# Patient Record
Sex: Male | Born: 1935
Health system: Southern US, Community
[De-identification: ages and names within clinical notes are randomized; demographics above are authoritative.]

## PROBLEM LIST (undated history)

## (undated) DIAGNOSIS — M1990 Unspecified osteoarthritis, unspecified site: Secondary | ICD-10-CM

## (undated) DIAGNOSIS — I10 Essential (primary) hypertension: Secondary | ICD-10-CM

## (undated) DIAGNOSIS — K219 Gastro-esophageal reflux disease without esophagitis: Secondary | ICD-10-CM

## (undated) DIAGNOSIS — A4151 Sepsis due to Escherichia coli [E. coli]: Secondary | ICD-10-CM

## (undated) DIAGNOSIS — Z8719 Personal history of other diseases of the digestive system: Secondary | ICD-10-CM

## (undated) DIAGNOSIS — I2510 Atherosclerotic heart disease of native coronary artery without angina pectoris: Secondary | ICD-10-CM

## (undated) DIAGNOSIS — Z951 Presence of aortocoronary bypass graft: Secondary | ICD-10-CM

## (undated) DIAGNOSIS — R7881 Bacteremia: Secondary | ICD-10-CM

## (undated) DIAGNOSIS — B353 Tinea pedis: Secondary | ICD-10-CM

## (undated) DIAGNOSIS — F32A Depression, unspecified: Secondary | ICD-10-CM

## (undated) DIAGNOSIS — R7303 Prediabetes: Secondary | ICD-10-CM

## (undated) DIAGNOSIS — F329 Major depressive disorder, single episode, unspecified: Secondary | ICD-10-CM

## (undated) DIAGNOSIS — E1142 Type 2 diabetes mellitus with diabetic polyneuropathy: Secondary | ICD-10-CM

## (undated) DIAGNOSIS — Z9289 Personal history of other medical treatment: Secondary | ICD-10-CM

## (undated) DIAGNOSIS — E785 Hyperlipidemia, unspecified: Secondary | ICD-10-CM

## (undated) DIAGNOSIS — Z96659 Presence of unspecified artificial knee joint: Secondary | ICD-10-CM

## (undated) DIAGNOSIS — Z9861 Coronary angioplasty status: Secondary | ICD-10-CM

## (undated) DIAGNOSIS — E1140 Type 2 diabetes mellitus with diabetic neuropathy, unspecified: Secondary | ICD-10-CM

## (undated) HISTORY — DX: Hyperlipidemia, unspecified: E78.5

## (undated) HISTORY — DX: Coronary angioplasty status: Z98.61

## (undated) HISTORY — DX: Gastro-esophageal reflux disease without esophagitis: K21.9

## (undated) HISTORY — DX: Type 2 diabetes mellitus with diabetic polyneuropathy: E11.42

## (undated) HISTORY — DX: Essential (primary) hypertension: I10

## (undated) HISTORY — DX: Presence of aortocoronary bypass graft: Z95.1

## (undated) HISTORY — DX: Bacteremia: R78.81

## (undated) HISTORY — PX: KNEE SURGERY: SHX244

## (undated) HISTORY — DX: Sepsis due to Escherichia coli (e. coli): A41.51

## (undated) HISTORY — PX: COLONOSCOPY: SHX174

## (undated) HISTORY — PX: PTCA: SHX146

## (undated) HISTORY — PX: SIGMOIDOSCOPY: SUR1295

## (undated) HISTORY — DX: Tinea pedis: B35.3

## (undated) HISTORY — DX: Unspecified osteoarthritis, unspecified site: M19.90

## (undated) HISTORY — PX: BRAIN SURGERY: SHX531

## (undated) HISTORY — DX: Type 2 diabetes mellitus with diabetic neuropathy, unspecified: E11.40

## (undated) HISTORY — PX: OTHER SURGICAL HISTORY: SHX169

## (undated) HISTORY — DX: Personal history of other medical treatment: Z92.89

## (undated) HISTORY — PX: CARDIAC CATHETERIZATION: SHX172

## (undated) HISTORY — DX: Presence of unspecified artificial knee joint: Z96.659

## (undated) HISTORY — DX: Atherosclerotic heart disease of native coronary artery without angina pectoris: I25.10

## (undated) HISTORY — PX: UPPER GASTROINTESTINAL ENDOSCOPY: SHX188

---

## 2000-04-19 ENCOUNTER — Ambulatory Visit (HOSPITAL_COMMUNITY): Admission: RE | Admit: 2000-04-19 | Discharge: 2000-04-19 | Payer: Self-pay | Admitting: *Deleted

## 2003-07-16 ENCOUNTER — Inpatient Hospital Stay (HOSPITAL_COMMUNITY): Admission: EM | Admit: 2003-07-16 | Discharge: 2003-07-18 | Payer: Self-pay | Admitting: Emergency Medicine

## 2003-08-03 ENCOUNTER — Emergency Department (HOSPITAL_COMMUNITY): Admission: EM | Admit: 2003-08-03 | Discharge: 2003-08-03 | Payer: Self-pay | Admitting: Family Medicine

## 2004-07-08 ENCOUNTER — Ambulatory Visit: Payer: Self-pay | Admitting: *Deleted

## 2004-07-09 ENCOUNTER — Ambulatory Visit (HOSPITAL_COMMUNITY): Admission: RE | Admit: 2004-07-09 | Discharge: 2004-07-09 | Payer: Self-pay | Admitting: *Deleted

## 2004-07-19 ENCOUNTER — Ambulatory Visit: Payer: Self-pay

## 2004-08-16 ENCOUNTER — Ambulatory Visit: Payer: Self-pay | Admitting: *Deleted

## 2004-08-18 ENCOUNTER — Ambulatory Visit: Payer: Self-pay

## 2005-04-27 ENCOUNTER — Ambulatory Visit: Payer: Self-pay | Admitting: Cardiology

## 2005-05-11 ENCOUNTER — Ambulatory Visit: Payer: Self-pay | Admitting: Internal Medicine

## 2005-05-19 ENCOUNTER — Ambulatory Visit: Payer: Self-pay | Admitting: Internal Medicine

## 2005-10-27 ENCOUNTER — Ambulatory Visit: Payer: Self-pay | Admitting: Cardiology

## 2005-11-08 ENCOUNTER — Ambulatory Visit: Payer: Self-pay | Admitting: Internal Medicine

## 2005-11-10 ENCOUNTER — Ambulatory Visit: Payer: Self-pay | Admitting: Cardiology

## 2005-11-11 ENCOUNTER — Ambulatory Visit: Payer: Self-pay | Admitting: Cardiology

## 2005-12-09 ENCOUNTER — Ambulatory Visit: Payer: Self-pay | Admitting: Internal Medicine

## 2006-02-01 ENCOUNTER — Ambulatory Visit: Payer: Self-pay | Admitting: Cardiology

## 2006-07-19 ENCOUNTER — Ambulatory Visit: Payer: Self-pay | Admitting: Cardiology

## 2006-07-25 ENCOUNTER — Ambulatory Visit: Payer: Self-pay | Admitting: Cardiology

## 2006-11-28 ENCOUNTER — Ambulatory Visit: Payer: Self-pay | Admitting: Internal Medicine

## 2006-11-28 LAB — CONVERTED CEMR LAB
ALT: 24 units/L (ref 0–40)
AST: 23 units/L (ref 0–37)
BUN: 16 mg/dL (ref 6–23)
Cholesterol: 185 mg/dL (ref 0–200)
Creatinine, Ser: 1 mg/dL (ref 0.4–1.5)
HDL: 32.2 mg/dL — ABNORMAL LOW (ref >39.0)
LDL Cholesterol: 126 mg/dL — ABNORMAL HIGH (ref 0–99)
PSA: 0.99 ng/mL (ref 0.10–4.00)
Potassium: 4.1 meq/L (ref 3.5–5.1)
Total CHOL/HDL Ratio: 5.7
Triglycerides: 133 mg/dL (ref 0–149)
VLDL: 27 mg/dL (ref 0–40)

## 2007-01-09 ENCOUNTER — Ambulatory Visit: Payer: Self-pay | Admitting: Cardiology

## 2007-01-19 ENCOUNTER — Ambulatory Visit: Payer: Self-pay | Admitting: Internal Medicine

## 2007-08-27 ENCOUNTER — Ambulatory Visit: Payer: Self-pay | Admitting: Cardiology

## 2007-09-03 ENCOUNTER — Telehealth: Payer: Self-pay | Admitting: Internal Medicine

## 2007-11-26 ENCOUNTER — Encounter: Payer: Self-pay | Admitting: *Deleted

## 2007-11-26 DIAGNOSIS — Z9861 Coronary angioplasty status: Secondary | ICD-10-CM

## 2007-11-26 HISTORY — DX: Coronary angioplasty status: Z98.61

## 2008-02-12 ENCOUNTER — Telehealth: Payer: Self-pay | Admitting: Internal Medicine

## 2008-05-28 ENCOUNTER — Telehealth: Payer: Self-pay | Admitting: Internal Medicine

## 2008-06-09 ENCOUNTER — Telehealth: Payer: Self-pay | Admitting: Internal Medicine

## 2008-10-14 ENCOUNTER — Encounter: Payer: Self-pay | Admitting: Cardiovascular Disease

## 2008-10-14 ENCOUNTER — Ambulatory Visit: Payer: Self-pay | Admitting: Cardiovascular Disease

## 2008-10-29 ENCOUNTER — Telehealth: Payer: Self-pay | Admitting: Cardiovascular Disease

## 2009-04-14 ENCOUNTER — Ambulatory Visit: Payer: Self-pay | Admitting: Cardiovascular Disease

## 2009-06-29 ENCOUNTER — Ambulatory Visit: Payer: Self-pay | Admitting: Internal Medicine

## 2009-06-29 DIAGNOSIS — M545 Low back pain, unspecified: Secondary | ICD-10-CM | POA: Insufficient documentation

## 2009-07-09 ENCOUNTER — Telehealth: Payer: Self-pay | Admitting: Internal Medicine

## 2009-12-28 ENCOUNTER — Telehealth: Payer: Self-pay | Admitting: Cardiovascular Disease

## 2010-01-29 ENCOUNTER — Ambulatory Visit: Payer: Self-pay | Admitting: Cardiovascular Disease

## 2010-02-22 ENCOUNTER — Ambulatory Visit: Payer: Self-pay

## 2010-02-22 ENCOUNTER — Encounter: Payer: Self-pay | Admitting: Cardiovascular Disease

## 2010-05-10 ENCOUNTER — Telehealth (INDEPENDENT_AMBULATORY_CARE_PROVIDER_SITE_OTHER): Payer: Self-pay | Admitting: *Deleted

## 2010-05-18 ENCOUNTER — Ambulatory Visit: Payer: Self-pay | Admitting: Internal Medicine

## 2010-05-19 ENCOUNTER — Telehealth: Payer: Self-pay | Admitting: Internal Medicine

## 2010-05-21 ENCOUNTER — Telehealth: Payer: Self-pay | Admitting: Internal Medicine

## 2010-05-25 ENCOUNTER — Telehealth: Payer: Self-pay | Admitting: Internal Medicine

## 2010-05-27 ENCOUNTER — Ambulatory Visit: Payer: Self-pay | Admitting: Internal Medicine

## 2010-05-27 LAB — CONVERTED CEMR LAB
ALT: 23 units/L (ref 0–53)
AST: 26 units/L (ref 0–37)
Albumin: 4.1 g/dL (ref 3.5–5.2)
Alkaline Phosphatase: 48 units/L (ref 39–117)
BUN: 16 mg/dL (ref 6–23)
Bilirubin, Direct: 0.1 mg/dL (ref 0.0–0.3)
CO2: 27 meq/L (ref 19–32)
Calcium: 9.1 mg/dL (ref 8.4–10.5)
Chloride: 102 meq/L (ref 96–112)
Cholesterol: 196 mg/dL (ref 0–200)
Creatinine, Ser: 1 mg/dL (ref 0.4–1.5)
GFR calc non Af Amer: 82.5 mL/min (ref >60)
Glucose, Bld: 124 mg/dL — ABNORMAL HIGH (ref 70–99)
HDL: 35.4 mg/dL — ABNORMAL LOW (ref >39.00)
LDL Cholesterol: 123 mg/dL — ABNORMAL HIGH (ref 0–99)
Potassium: 3.9 meq/L (ref 3.5–5.1)
Sodium: 139 meq/L (ref 135–145)
Total Bilirubin: 0.6 mg/dL (ref 0.3–1.2)
Total CHOL/HDL Ratio: 6
Total Protein: 6.6 g/dL (ref 6.0–8.3)
Triglycerides: 187 mg/dL — ABNORMAL HIGH (ref 0.0–149.0)
VLDL: 37.4 mg/dL (ref 0.0–40.0)

## 2010-06-04 ENCOUNTER — Encounter: Payer: Self-pay | Admitting: Internal Medicine

## 2010-07-01 ENCOUNTER — Ambulatory Visit: Payer: Self-pay | Admitting: Internal Medicine

## 2010-07-01 DIAGNOSIS — M542 Cervicalgia: Secondary | ICD-10-CM | POA: Insufficient documentation

## 2010-07-06 ENCOUNTER — Encounter: Payer: Self-pay | Admitting: Internal Medicine

## 2010-07-29 ENCOUNTER — Ambulatory Visit
Admission: RE | Admit: 2010-07-29 | Discharge: 2010-07-29 | Payer: Self-pay | Source: Home / Self Care | Attending: Cardiovascular Disease | Admitting: Cardiovascular Disease

## 2010-08-10 NOTE — Progress Notes (Signed)
  Phone Note Refill Request Message from:  Fax from Pharmacy on May 21, 2010 11:52 AM  Refills Requested: Medication #1:  KLOR-CON M20 20 MEQ CR-TABS 2 tab once two times a day Initial call taken by: Ami Bullins CMA,  May 21, 2010 11:52 AM    Prescriptions: KLOR-CON M20 20 MEQ CR-TABS (POTASSIUM CHLORIDE CRYS CR) 2 tab once two times a day  #180 x 3   Entered by:   Ami Bullins CMA   Authorized by:   Jacques Navy MD   Signed by:   Bill Salinas CMA on 05/21/2010   Method used:   Faxed to ...       [facility] (mail-order)       [address] [place], [place]  [address]       Ph: [phone]       Fax: [phone]   RxID:   [id number]

## 2010-08-10 NOTE — Letter (Signed)
[facility] [address] [place], [place]  [address] Phone: [phone]      June 06, 2010   [name] [address] [place], [place] [address]  RE:  LAB RESULTS  Dear  [name],  The following is an interpretation of your most recent lab tests.  Please take note of any instructions provided or changes to medications that have resulted from your lab work.  ELECTROLYTES:  Good - no changes needed  KIDNEY FUNCTION TESTS:  Good - no changes needed  LIVER FUNCTION TESTS:  Good - no changes needed  Health professionals look at cholesterol as more involved than just the total cholesterol. We consider the level of LDL (bad) cholesterol, HDL (good), cholesterol, and Triglycerides (Grease) in the blood.  1. Your LDL should be under 100, and the HDL should be over 45, if you have any vascular disease such as heart attack, angina, stroke, TIA (mini stroke), claudication (pain in the legs when you walk due to poor circulation),  Abdominal Aortic Aneurysm (AAA), diabetes or prediabetes.  2. Your LDL should be under 130 if you have any two of the following:     a. Smoke or chew tobacco,     b. High blood pressure (if you are on medication or over 140/90 without medication),     c. Male gender,    d. HDL below 40,    e. A male relative (father, brother, or son), who have had any vascular event          as described in #1. above under the age of 94, or a male relative (mother,       sister, or daughter) who had an event as described above under age 45. (An HDL over 60 will subtract one risk factor from the total, so if you have two items in # 2 above, but an HDL over 60, you then fall into category # 3 below).  3. Your LDL should be under 160 if you have any one of the above.  Triglycerides should be under 200 with the ideal being under 150.  For diabetes or pre-diabetes, the ideal HgbA1C should be under 6.0%.  If you fall into any of the above categories,  you should make a follow up appointment to discuss this with your physician.  LIPID PANEL:  Stable - no changes needed Triglyceride: 187.0   Cholesterol: 196   LDL: 123   HDL: 35.40   Chol/HDL%:  6   Lab results look ok. Blood sugar in the fasting state is above normal, e.g. greater than 115. Recommend that you follow a no sugar low carb diet.  Please come see me if you have any questions about these lab results.   Sincerely Yours,    Craig Navy MD  Patient: [name] Note: All result statuses are Final unless otherwise noted.  Tests: (1) BMP (METABOL)   Sodium                    139 mEq/L                   135-145   Potassium                 3.9 mEq/L                   3.5-5.1   Chloride  102 mEq/L                   96-112   Carbon Dioxide            27 mEq/L                    19-32   Glucose              [H]  124 mg/dL                   60-45   BUN                       16 mg/dL                    4-09   Creatinine                1.0 mg/dL                   8.1-1.9   Calcium                   9.1 mg/dL                   1.4-78.2   GFR                       82.50 mL/min                >60  Tests: (2) Lipid Panel (LIPID)   Cholesterol               196 mg/dL                   9-562     ATP III Classification            Desirable:  < 200 mg/dL                    Borderline High:  200 - 239 mg/dL               High:  > = 240 mg/dL   Triglycerides        [H]  187.0 mg/dL                 1.3-086.5     Normal:  <150 mg/dL     Borderline High:  784 - 199 mg/dL   HDL                  [L]  69.62 mg/dL                 >95.28   VLDL Cholesterol          37.4 mg/dL                  4.1-32.4   LDL Cholesterol      [H]  401 mg/dL                   0-27  CHO/HDL Ratio:  CHD Risk                             6                    Men  Women     1/2 Average Risk     3.4          3.3     Average Risk          5.0          4.4     2X Average Risk           9.6          7.1     3X Average Risk          15.0          11.0                           Tests: (3) Hepatic/Liver Function Panel (HEPATIC)   Total Bilirubin           0.6 mg/dL                   2.5-9.5   Direct Bilirubin          0.1 mg/dL                   6.3-8.7   Alkaline Phosphatase      48 U/L                      39-117   AST                       26 U/L                      0-37   ALT                       23 U/L                      0-53   Total Protein             6.6 g/dL                    5.6-4.3   Albumin                   4.1 g/dL                    3.2-9.5

## 2010-08-10 NOTE — Progress Notes (Signed)
  Phone Note Outgoing Call   Reason for Call: Discuss lab or test results Summary of Call: please call  pt: x-ray with mild shifitng of vertebra at L2 and L4 but no marked disk or degenerative joint disease. Stick with instructions given. No need for any further testing.   Thanks  Initial call taken by: Jacques Navy MD,  May 19, 2010 4:43 AM  Follow-up for Phone Call        informed pt of results Follow-up by: Ami Bullins CMA,  May 19, 2010 1:20 PM

## 2010-08-10 NOTE — Assessment & Plan Note (Signed)
Summary: back pain-lb   Vital Signs:  Patient profile:   76 year old male Height:      68 inches Weight:      190 pounds BMI:     28.99 O2 Sat:      97 % on Room air Temp:     97.7 degrees F oral Pulse rate:   77 / minute BP sitting:   128 / 78  (left arm) Cuff size:   large  Vitals Entered By: Bill Salinas CMA (May 18, 2010 10:06 AM)  O2 Flow:  Room air CC: pt here with c/o lower back pain. He states he has been doing back excercises with little relief/ ab   Primary Care Johnnetta Holstine:  Jacques Navy MD  CC:  pt here with c/o lower back pain. He states he has been doing back excercises with little relief/ ab.  History of Present Illness: Patient presents with a long hostory of low back pain. He noticed this more when in [place] after an 1our motorcycle ride.  The pain is in the low back, more on the left than the right. He has had no radiaition o f pain to the leg. He has had no focal weakness, incontinenc of bowel or bladder, no paresthesia. He has been taking no medication for the pain, he is not doing any stretching or flexing exercise.   Current Medications (verified): 1)  Atenolol-Chlorthalidone 50-25 Mg  Tabs (Atenolol-Chlorthalidone) .... Take Once Daily 2)  Aspirin 325 Mg  Tabs (Aspirin) .... Take Once Daily 3)  Klor-Con M20 20 Meq Cr-Tabs (Potassium Chloride Crys Cr) .... 2 Tab Once Two Times A Day 4)  Flax Seed Oil 1000 Mg  Caps (Flaxseed (Linseed)) .... Take One Tablet Two Times A Day 5)  Imdur 60 Mg Xr24h-Tab (Isosorbide Mononitrate) .... Take One Tablet Daily 6)  Red Yeast Rice Extract 600 Mg  Caps (Red Yeast Rice Extract) .... Take 1 Capsule By Mouth Three Times A Day 7)  Nitroquick 0.4 Mg  Subl (Nitroglycerin) .... Take Sl As Needed For Chest Pains.  Allergies (verified): 1)  ! * Statins  Past History:  Past Medical History: Last updated: 01/29/2010 CORONARY ARTERY DISEASE (ICD-414.00) s/p stent mid LAD 2005 DEGENERATIVE JOINT DISEASE (ICD-715.90) GERD  (ICD-530.81) DIABETES MELLITUS (ICD-250.00) HYPERTENSION (ICD-401.9) HYPERLIPIDEMIA (ICD-272.4)    Past Surgical History: Last updated: 10/14/2008 PERCUTANEOUS TRANSLUMINAL CORONARY ANGIOPLASTY, HX OF (ICD-V45.82) Knee surgery Kidney stone removal FH reviewed for relevance, SH/Risk Factors reviewed for relevance  Review of Systems  The patient denies anorexia, fever, weight loss, chest pain, dyspnea on exertion, prolonged cough, abdominal pain, muscle weakness, difficulty walking, and enlarged lymph nodes.    Physical Exam  General:  WNWD white male in no distress Head:  normocephalic and atraumatic.   Eyes:  vision grossly intact.   Lungs:  normal respiratory effort and normal breath sounds.   Heart:  normal rate and regular rhythm.   Abdomen:  soft and non-tender.   Msk:  back: nl stand, flex, gait. Some discomfort with heel walk, no discomfort with toe walk. Able to step up to exam without assist. Nl SLR sitting, nl DTRs patellar tendons, nl sensation to light touch and pin-prick, decrease in deep vibratory sensation laterl right leg and foot.  Pulses:  2+ radial and DP pulses Extremities:  No clubbing, cyanosis, edema, or deformity noted with normal full range of motion of all joints.   Neurologic:  alert & oriented X3 and cranial nerves II-XII intact.  Skin:  turgor normal, color normal, and no suspicious lesions.   Cervical Nodes:  no anterior cervical adenopathy and no posterior cervical adenopathy.   Psych:  Oriented X3, normally interactive, good eye contact, and not anxious appearing.     Impression & Recommendations:  Problem # 1:  LOW BACK PAIN, ACUTE (ICD-724.2) Patient with no radicular findings on exam.  Plan - plain films to evaluated for DDD vs DJD           referred to YouTube for low back exercise and stretching; given directions to [email or web address] for info on back pain           OK to take Aleve two times a day - GI precautions given.  His updated  medication list for this problem includes:    Aspirin 325 Mg Tabs (Aspirin) .Marland Kitchen... Take once daily  Orders: T-Lumbar Spine Comp w/Bend View [id number])  G LUMBAR SPINE COMPLETE W/BEND - [id number]   Clinical Data: Low back and left leg pain.   LUMBAR SPINE - COMPLETE WITH BENDING VIEWS   Comparison: Lumbar spine series 08/03/2003.   Findings: A lower pole right renal calculus is noted.  There is mild anterolisthesis of L4 which is a new finding.  No definite pars defects.  This is likely due to degenerative facet disease. In the neutral lateral position this is estimated at 5 mm.  With flexion this increases to 11 mm and in extension is 7.5 mm.  There is also mild anterolisthesis of L2 measured at 5 mm in neutral, 6 mm with flexion and 9 mm with extension.  No acute bony findings. There are SI joint generative changes.   IMPRESSION:   1.  Anterolisthesis of L2 and L4 with some motion/instability with flexion and extension. 2.  No pars defects. 3.  No acute bony findings. 4.  Right lower pole renal calculus.   Read By:  Cyndie Chime,  M.D.  Complete Medication List: 1)  Atenolol-chlorthalidone 50-25 Mg Tabs (Atenolol-chlorthalidone) .... Take once daily 2)  Aspirin 325 Mg Tabs (Aspirin) .... Take once daily 3)  Klor-con M20 20 Meq Cr-tabs (Potassium chloride crys cr) .... 2 tab once two times a day 4)  Flax Seed Oil 1000 Mg Caps (Flaxseed (linseed)) .... Take one tablet two times a day 5)  Imdur 60 Mg Xr24h-tab (Isosorbide mononitrate) .... Take one tablet daily 6)  Red Yeast Rice Extract 600 Mg Caps (Red yeast rice extract) .... Take 1 capsule by mouth three times a day 7)  Nitroquick 0.4 Mg Subl (Nitroglycerin) .... Take sl as needed for chest pains.  Patient Instructions: 1)  low back pain - on exam I do not see signs of a pinched nerve that would require surgery, thus no MRI. I believe you have arhtritis of the lumbar spine that is causing the pain. Plan - plain x-rays;  OK to take aleve 1 in the AM and 1 in the PM. Stretch and flex is the golden key to treatment.  Prescriptions: NITROQUICK 0.4 MG  SUBL (NITROGLYCERIN) Take sl as needed for chest pains.  #25 x 3   Entered by:   Bill Salinas CMA   Authorized by:   Jacques Navy MD   Signed by:   Bill Salinas CMA on 05/18/2010   Method used:   Faxed to ...       [facility] (mail[facility]order)       [address]       [place], [place]  [address]  Ph: [phone]       Fax: [phone]   RxID:   [id number] IMDUR 60 MG XR24H-TAB (ISOSORBIDE MONONITRATE) take one tablet daily  #90 x 3   Entered by:   Ami Bullins CMA   Authorized by:   Jacques Navy MD   Signed by:   Bill Salinas CMA on 05/18/2010   Method used:   Faxed to ...       [facility] (mail-order)       [address] [place], [place]  [address]       Ph: [phone]       Fax: [phone]   RxID:   [id number] KLOR-CON M20 20 MEQ CR-TABS (POTASSIUM CHLORIDE CRYS CR) 2 tab once two times a day  #180 x 3   Entered by:   Ami Bullins CMA   Authorized by:   Jacques Navy MD   Signed by:   Bill Salinas CMA on 05/18/2010   Method used:   Faxed to ...       [facility] (mail-order)       [address] [place], [place]  [address]       Ph: [phone]       Fax: [phone]   RxID:   [id number] ATENOLOL-CHLORTHALIDONE 50-25 MG  TABS (ATENOLOL-CHLORTHALIDONE) Take once daily  #90 x 3   Entered by:   Bill Salinas CMA   Authorized by:   Jacques Navy MD   Signed by:   Bill Salinas CMA on 05/18/2010   Method used:   Faxed to ...       [facility] (mail-order)       [address] [place], [place]  [address]       Ph: [phone]       Fax: [phone]   RxID:   [id number]    Orders Added: 1)  T-Lumbar Spine Comp w/Bend View [72114TC] 2)  Est. Patient Level IV [25852]     Preventive Care Screening  Last Flu Shot:    Date:  05/17/2010    Results:  Declined

## 2010-08-10 NOTE — Progress Notes (Signed)
Summary: Mail order pharmacy ?  Phone Note From Pharmacy   Caller: [facility] [phone] REF # [phone] Summary of Call: Mail order pharm called. They need clarification of potassium rx. It is 2 tabs two times a day CORRECT?  Initial call taken by: Lamar Sprinkles, CMA,  May 25, 2010 10:35 AM  Follow-up for Phone Call        Hmmm. a little confusing. When in doubt - ask the patient what he is taking.   Reviewed records: last lab in '08!!!! Needs to have metabolic 401.9, lipid panel 272.4 hepatic 995.20. NOW.  Will base up-dated dosing on potassium on the lab work.  Follow-up by: Jacques Navy MD,  May 25, 2010 1:00 PM  Additional Follow-up for Phone Call Additional follow up Details #1::        Spoke w/pt, he is taking 2 tabs two times a day and will come in Thurs AM for fasting labs. Additional Follow-up by: Lamar Sprinkles, CMA,  May 25, 2010 2:10 PM    Additional Follow-up for Phone Call Additional follow up Details #2::    then the [facility] was right and we can refill as it is written. THANKS Follow-up by: Jacques Navy MD,  May 26, 2010 9:02 AM  New/Updated Medications: KLOR-CON M20 20 MEQ CR-TABS (POTASSIUM CHLORIDE CRYS CR) 2 tabs two times a day Prescriptions: KLOR-CON M20 20 MEQ CR-TABS (POTASSIUM CHLORIDE CRYS CR) 2 tabs two times a day  #180 x 3   Entered by:   Lamar Sprinkles, CMA   Authorized by:   Jacques Navy MD   Signed by:   Lamar Sprinkles, CMA on 05/26/2010   Method used:   Faxed to ...       [facility] (mail[facility]order)       [address] [place], [place]  [address]       Ph: [phone]       Fax: [phone]   RxID:   [id number]

## 2010-08-10 NOTE — Assessment & Plan Note (Signed)
Summary: rov   Visit Type:  Follow-up Primary Provider:  Jacques Navy MD  CC:  Some chest pains since last visit.  History of Present Illness: 76 yo WM with history of CAD s/p placement of drug eluting stent in mid LAD in January of 2005 with residual disease in proximal LAD and RCA, HTN, Hyperlipidemia, DM and GERD who presents today for routine cardiac follow up.  He has been doing well and has remained active. He describes stable chest pains over the last few months. These pains have not been as intense as the pain that he had before his stent placement.  The pain does not radiate into his neck, jaw or arm. There is no associated SOB, diaphoresis or nausea. The pains occur after breakfast. He does He has not tolerated statins in the past secondary to myalgias. His main complaints are of back pain and abdominal pain, mostly chronic. He has been on muscle relaxants and steroids with little improvement.   Current Medications (verified): 1)  Atenolol-Chlorthalidone 50-25 Mg  Tabs (Atenolol-Chlorthalidone) .... Take Once Daily 2)  Aspirin 325 Mg  Tabs (Aspirin) .... Take Once Daily 3)  Klor-Con M20 20 Meq Cr-Tabs (Potassium Chloride Crys Cr) .... 2 Tab Once Daily 4)  Flax Seed Oil 1000 Mg  Caps (Flaxseed (Linseed)) .... Take One Tablet Two Times A Day 5)  Imdur 60 Mg Xr24h-Tab (Isosorbide Mononitrate) .... Take One Tablet Daily 6)  Red Yeast Rice Extract 600 Mg  Caps (Red Yeast Rice Extract) .... Take 1 Capsule By Mouth Three Times A Day 7)  Nitroquick 0.4 Mg  Subl (Nitroglycerin) .... Take Sl As Needed For Chest Pains.  Allergies: 1)  ! * Statins  Past History:  Past Medical History: CORONARY ARTERY DISEASE (ICD-414.00) s/p stent mid LAD 2005 DEGENERATIVE JOINT DISEASE (ICD-715.90) GERD (ICD-530.81) DIABETES MELLITUS (ICD-250.00) HYPERTENSION (ICD-401.9) HYPERLIPIDEMIA (ICD-272.4)    Social History: Reviewed history from 10/14/2008 and no changes required. Tobacco history, not  used since 1985. Smoked 1ppd for 35 years No alcohol No illicit drug use Married 3 children Retired from [employer]  Review of Systems       The patient complains of chest pain and joint pain.  The patient denies fatigue, malaise, fever, weight gain/loss, vision loss, decreased hearing, hoarseness, palpitations, shortness of breath, prolonged cough, wheezing, sleep apnea, coughing up blood, abdominal pain, blood in stool, nausea, vomiting, diarrhea, heartburn, incontinence, blood in urine, muscle weakness, leg swelling, rash, skin lesions, headache, fainting, dizziness, depression, anxiety, enlarged lymph nodes, easy bruising or bleeding, and environmental allergies.    Vital Signs:  Patient profile:   76 year old male Height:      68 inches Weight:      189.50 pounds BMI:     28.92 Pulse rate:   69 / minute Pulse rhythm:   regular Resp:     18 per minute BP sitting:   110 / 70  (left arm) Cuff size:   large  Vitals Entered By: Vikki Ports (January 29, 2010 10:24 AM)  Physical Exam  General:  General: Well developed, well nourished, NAD HEENT: OP clear, mucus membranes moist SKIN: warm, dry Neuro: No focal deficits Musculoskeletal: Muscle strength 5/5 all ext Psychiatric: Mood and affect normal Neck: No JVD, mild carotid bruit, no thyromegaly, no lymphadenopathy. Lungs:Clear bilaterally, no wheezes, rhonci, crackles CV: RRR no murmurs, gallops rubs Abdomen: soft, NT, ND, BS present Extremities: No edema, pulses 2+.    EKG  Procedure date:  01/29/2010  Findings:  NSR, rate 69 bpm. LAFB.   Impression & Recommendations:  Problem # 1:  CORONARY ARTERY DISEASE (ICD-414.00) Stable. I have encouraged him to call us if his chest pain changes in nature or in frequency. He has not shown ischemia on stress testing in the past. He would need repeat cath if his chest pain worsens.  Will continue current meds. He does not tolerate statins. Will refill sublingual NTG.  His  updated medication list for this problem includes:    Atenolol-chlorthalidone 50-25 Mg Tabs (Atenolol-chlorthalidone) .Marland Kitchen... Take once daily    Aspirin 325 Mg Tabs (Aspirin) .Marland Kitchen... Take once daily    Imdur 60 Mg Xr24h-tab (Isosorbide mononitrate) .Marland Kitchen... Take one tablet daily    Nitroquick 0.4 Mg Subl (Nitroglycerin) .Marland Kitchen... Take sl as needed for chest pains.  Orders: Carotid Duplex (Carotid Duplex) Abdominal Aorta Duplex (Abd Aorta Duplex)  Problem # 2:  HYPERLIPIDEMIA (ICD-272.4) Per Dr. Debby Bud. He has not tolerated statins. He needs repeat lipids. Will discuss with Dr. Debby Bud at f/u in near future.   Problem # 3:  HYPERTENSION (ICD-401.9) BP well controlled. Continue current therapy.   His updated medication list for this problem includes:    Atenolol-chlorthalidone 50-25 Mg Tabs (Atenolol-chlorthalidone) .Marland Kitchen... Take once daily    Aspirin 325 Mg Tabs (Aspirin) .Marland Kitchen... Take once daily  Patient Instructions: 1)  Your physician recommends that you schedule a follow-up appointment in: 6 months 2)  Your physician recommends that you continue on your current medications as directed. Please refer to the Current Medication list given to you today. 3)  Your physician has requested that you have an abdominal aorta duplex. During this test, an ultrasound is used to evaluate the aorta. Allow 30 minutes for this exam. Do not eat after midnight the day before and avoid carbonated beverages. There are no restrictions or special instructions. 4)  Your physician has requested that you have a carotid duplex. This test is an ultrasound of the carotid arteries in your neck. It looks at blood flow through these arteries that supply the brain with blood. Allow one hour for this exam. There are no restrictions or special instructions. Prescriptions: NITROQUICK 0.4 MG  SUBL (NITROGLYCERIN) Take sl as needed for chest pains.  #25 x 6   Entered by:   Dossie Arbour, RN, BSN   Authorized by:   Verne Carrow,  MD   Signed by:   Dossie Arbour, RN, BSN on 01/29/2010   Method used:   Electronically to        [facility]* (retail)       [address]       [place], [place]  [address]       Ph: [phone]       Fax: [phone]   RxID:   [id number]

## 2010-08-10 NOTE — Progress Notes (Signed)
Summary: refill request  Phone Note Call from Patient   Caller: Patient Summary of Call: pt called to see about getting refill on klor-kon.  Initial call taken by: Alysia Penna,  May 10, 2010 3:05 PM  Follow-up for Phone Call        refill request has been sent to pharmacy. tried to call Pt to let them know but no answer. Follow-up by: Alysia Penna,  May 10, 2010 3:05 PM

## 2010-08-10 NOTE — Progress Notes (Signed)
Summary: refill - atenolol/chlorthalidone  Phone Note Refill Request Message from:  Patient on December 28, 2009 12:58 PM  Refills Requested: Medication #1:  ATENOLOL-CHLORTHALIDONE 50-25 MG  TABS Take once daily [facility]  Initial call taken by: Judie Grieve,  December 28, 2009 12:58 PM    Prescriptions: ATENOLOL-CHLORTHALIDONE 50-25 MG  TABS (ATENOLOL-CHLORTHALIDONE) Take once daily  #90 x 3   Entered by:   Hardin Negus, RMA   Authorized by:   Verne Carrow, MD   Signed by:   Hardin Negus, RMA on 12/28/2009   Method used:   Faxed to ...       [facility] (mail-order)       [address] [place], [place]  [address]       Ph: [phone]       Fax: [phone]   RxID:   [id number]

## 2010-08-12 NOTE — Assessment & Plan Note (Signed)
Summary: 6 month rov.sl   Visit Type:  6 month follow up Primary Provider:  Jacques Navy MD  CC:  no complaints.  History of Present Illness: 76 yo WM with history of CAD s/p placement of drug eluting stent in mid LAD in January of 2005 with residual disease in proximal LAD and RCA, HTN, Hyperlipidemia, DM and GERD who presents today for routine cardiac follow up.  He has been doing well and has remained active. He describes stable chest pains over the last few months. These pains have not been as intense as the pain that he had before his stent placement.  The pain does not radiate into his neck, jaw or arm. He has not used his NTG. There is no associated SOB, diaphoresis or nausea.  Occurs mostly after meals. He has not tolerated statins in the past secondary to myalgias. His main complaints are of back pain and abdominal pain, mostly chronic. He was told that he has arthritic changes. He has been using muscle relaxants as needed.   Current Medications (verified): 1)  Atenolol-Chlorthalidone 50-25 Mg  Tabs (Atenolol-Chlorthalidone) .... Take Once Daily 2)  Aspirin 325 Mg  Tabs (Aspirin) .... Take Once Daily 3)  Klor-Con M20 20 Meq Cr-Tabs (Potassium Chloride Crys Cr) .... 2 Tabs Two Times A Day 4)  Flax Seed Oil 1000 Mg  Caps (Flaxseed (Linseed)) .... Take One Tablet Two Times A Day 5)  Imdur 60 Mg Xr24h-Tab (Isosorbide Mononitrate) .... Take One Tablet Daily 6)  Red Yeast Rice Extract 600 Mg  Caps (Red Yeast Rice Extract) .... Take 1 Capsule By Mouth Three Times A Day 7)  Nitroquick 0.4 Mg  Subl (Nitroglycerin) .... Take Sl As Needed For Chest Pains. 8)  Cyclobenzaprine Hcl 5 Mg Tabs (Cyclobenzaprine Hcl) .Marland Kitchen.. 1 or 2 Tablets Every 6 Hours For Neck Stiffness and Pain.  Allergies (verified): 1)  ! * Statins  Past History:  Past Medical History: Reviewed history from 01/29/2010 and no changes required. CORONARY ARTERY DISEASE (ICD-414.00) s/p stent mid LAD 2005 DEGENERATIVE JOINT  DISEASE (ICD-715.90) GERD (ICD-530.81) DIABETES MELLITUS (ICD-250.00) HYPERTENSION (ICD-401.9) HYPERLIPIDEMIA (ICD-272.4)    Social History: Reviewed history from 10/14/2008 and no changes required. Tobacco history, not used since 1985. Smoked 1ppd for 35 years No alcohol No illicit drug use Married 3 children Retired from [employer]  Review of Systems       The patient complains of chest pain and joint pain.  The patient denies fatigue, malaise, fever, weight gain/loss, vision loss, decreased hearing, hoarseness, palpitations, shortness of breath, prolonged cough, wheezing, sleep apnea, coughing up blood, abdominal pain, blood in stool, nausea, vomiting, diarrhea, heartburn, incontinence, blood in urine, muscle weakness, leg swelling, rash, skin lesions, headache, fainting, dizziness, depression, anxiety, enlarged lymph nodes, easy bruising or bleeding, and environmental allergies.    Vital Signs:  Patient profile:   76 year old male Height:      68 inches Weight:      194.50 pounds BMI:     29.68 Pulse rate:   88 / minute BP sitting:   136 / 80  (left arm) Cuff size:   regular  Vitals Entered By: Caralee Ates CMA (July 29, 2010 2:26 PM)  Physical Exam  General:  General: Well developed, well nourished, NAD Musculoskeletal: Muscle strength 5/5 all ext Psychiatric: Mood and affect normal Neck: No JVD, no carotid bruits, no thyromegaly, no lymphadenopathy. Lungs:Clear bilaterally, no wheezes, rhonci, crackles CV: RRR no murmurs, gallops rubs Abdomen: soft, NT, ND,  BS present Extremities: No edema, pulses 2+.    Impression & Recommendations:  Problem # 1:  CORONARY ARTERY DISEASE (ICD-414.00) Stable. No changes in medical therapy.   His updated medication list for this problem includes:    Atenolol-chlorthalidone 50-25 Mg Tabs (Atenolol-chlorthalidone) .Marland Kitchen... Take once daily    Aspirin 325 Mg Tabs (Aspirin) .Marland Kitchen... Take once daily    Imdur 60 Mg Xr24h-tab (Isosorbide  mononitrate) .Marland Kitchen... Take one tablet daily    Nitroquick 0.4 Mg Subl (Nitroglycerin) .Marland Kitchen... Take sl as needed for chest pains.  Problem # 2:  HYPERTENSION (ICD-401.9) Well controlled.  No changes.   His updated medication list for this problem includes:    Atenolol-chlorthalidone 50-25 Mg Tabs (Atenolol-chlorthalidone) .Marland Kitchen... Take once daily    Aspirin 325 Mg Tabs (Aspirin) .Marland Kitchen... Take once daily  Patient Instructions: 1)  Your physician recommends that you schedule a follow-up appointment in: 6 months.  2)  Your physician recommends that you continue on your current medications as directed. Please refer to the Current Medication list given to you today.

## 2010-08-12 NOTE — Letter (Signed)
   [facility] [address] [place], [place]  [address] Phone: [phone]      July 06, 2010   EFE FAZZINO [address] [place], [place] [address]  RE:  LAB RESULTS  Dear  Mr. Rolon,  The following is an interpretation of your most recent lab tests.  Please take note of any instructions provided or changes to medications that have resulted from your lab work.  cervical spine films with mild arthritic change.    Hope your neck is feeling better. If not let me know and we can arrange Physical therapy   Sincerely Yours,    Jacques Navy MD  G CERVICAL SPINE WITH FLEX & EXTEND - [id number]   Clinical Data: Right posterior neck pain, no acute trauma   CERVICAL SPINE COMPLETE WITH FLEXION AND EXTENSION VIEWS   Comparison: None.   Findings: There is mild curvature of the cervical vertebrae convex to the left.  Mild degenerative disc disease is noted primarily at C5-6 with only slight loss of disc space and minimal spurring.  No prevertebral soft tissue swelling is seen.  On oblique views there is some foraminal narrowing on the right at the C4-5 level.  The odontoid process is intact.  Through flexion and extension there is only slightly limited range of motion.   IMPRESSION:   1.  Curvature convex to the left with mild degenerative disc disease is C5-6.  Some foraminal narrowing is noted at C4-5 as well. 2.  Slightly limited range of motion through flexion and extension.   Read By:  Juline Patch,  M.D.

## 2010-08-12 NOTE — Assessment & Plan Note (Signed)
Summary: neck pain/ ab   Vital Signs:  Patient profile:   76 year old male Height:      68 inches Weight:      190 pounds BMI:     28.99 O2 Sat:      96 % on Room air Temp:     98.5 degrees F oral Pulse rate:   67 / minute BP sitting:   116 / 68  (left arm) Cuff size:   large  Vitals Entered By: Bill Salinas CMA (July 01, 2010 1:45 PM)  O2 Flow:  Room air CC: pt here with c/o stiffness and pain in his neck x 2 days/ ab   Primary Care Provider:  Jacques Navy MD  CC:  pt here with c/o stiffness and pain in his neck x 2 days/ ab.  History of Present Illness: Patient presents for persistent pain in the left posterior neck. He denies any recent injury or strain. The discomfort is fairly constant. He has no radiation of pain to the arm or back, no paresthesia. He has not been limited in his activities. He has not tried any medication.  Current Medications (verified): 1)  Atenolol-Chlorthalidone 50-25 Mg  Tabs (Atenolol-Chlorthalidone) .... Take Once Daily 2)  Aspirin 325 Mg  Tabs (Aspirin) .... Take Once Daily 3)  Klor-Con M20 20 Meq Cr-Tabs (Potassium Chloride Crys Cr) .... 2 Tabs Two Times A Day 4)  Flax Seed Oil 1000 Mg  Caps (Flaxseed (Linseed)) .... Take One Tablet Two Times A Day 5)  Imdur 60 Mg Xr24h-Tab (Isosorbide Mononitrate) .... Take One Tablet Daily 6)  Red Yeast Melvie Paglia Extract 600 Mg  Caps (Red Yeast Dallan Schonberg Extract) .... Take 1 Capsule By Mouth Three Times A Day 7)  Nitroquick 0.4 Mg  Subl (Nitroglycerin) .... Take Sl As Needed For Chest Pains.  Allergies (verified): 1)  ! * Statins  Past History:  Past Medical History: Last updated: 01/29/2010 CORONARY ARTERY DISEASE (ICD-414.00) s/p stent mid LAD 2005 DEGENERATIVE JOINT DISEASE (ICD-715.90) GERD (ICD-530.81) DIABETES MELLITUS (ICD-250.00) HYPERTENSION (ICD-401.9) HYPERLIPIDEMIA (ICD-272.4)    Past Surgical History: Last updated: 11/05/08 PERCUTANEOUS TRANSLUMINAL CORONARY ANGIOPLASTY, HX OF  (ICD-V45.82) Knee surgery Kidney stone removal  Family History: Last updated: 11/05/08 Father deceased, CAD Mother deceased COPD, CHF  Social History: Last updated: 2008-11-05 Tobacco history, not used since 1985. Smoked 1ppd for 35 years No alcohol No illicit drug use Married 3 children Retired from [employer]  Review of Systems  The patient denies anorexia, fever, weight loss, weight gain, vision loss, hoarseness, chest pain, dyspnea on exertion, headaches, muscle weakness, and enlarged lymph nodes.    Physical Exam  General:  WNWD white male in no acute distress. Head:  normocephalic and atraumatic.   Eyes:  pupils equal, pupils round, and corneas and lenses clear.   Neck:  decreased ROM with extension and rotation. Chest Wall:  No deformities, masses, tenderness or gynecomastia noted. Lungs:  normal respiratory effort and normal breath sounds.   Heart:  normal rate and regular rhythm.   Neurologic:  alert & oriented X3, strength normal in all extremities, and DTRs symmetrical and normal.  Preserved sensation. Skin:  turgor normal, color normal, and no suspicious lesions.   Cervical Nodes:  no anterior cervical adenopathy and no posterior cervical adenopathy.   Psych:  Oriented X3, good eye contact, and not anxious appearing.     Impression & Recommendations:  Problem # 1:  NECK PAIN, ACUTE (ICD-723.1) No radicular symptoms or findings. Suspect acute pain  related to DJD.  Plan - cervical spine films           muscle relaxant           ROM exercises several times a day.   His updated medication list for this problem includes:    Aspirin 325 Mg Tabs (Aspirin) .Marland Kitchen... Take once daily    Cyclobenzaprine Hcl 5 Mg Tabs (Cyclobenzaprine hcl) .Marland Kitchen... 1 or 2 tablets every 6 hours for neck stiffness and pain.  Orders: T-Cervical Spine Comp w/Flex & Ext ([address]UE)  Complete Medication List: 1)  Atenolol-chlorthalidone 50-25 Mg Tabs (Atenolol-chlorthalidone) .... Take once  daily 2)  Aspirin 325 Mg Tabs (Aspirin) .... Take once daily 3)  Klor-con M20 20 Meq Cr-tabs (Potassium chloride crys cr) .... 2 tabs two times a day 4)  Flax Seed Oil 1000 Mg Caps (Flaxseed (linseed)) .... Take one tablet two times a day 5)  Imdur 60 Mg Xr24h-tab (Isosorbide mononitrate) .... Take one tablet daily 6)  Red Yeast Lizvette Lightsey Extract 600 Mg Caps (Red yeast Marlea Gambill extract) .... Take 1 capsule by mouth three times a day 7)  Nitroquick 0.4 Mg Subl (Nitroglycerin) .... Take sl as needed for chest pains. 8)  Cyclobenzaprine Hcl 5 Mg Tabs (Cyclobenzaprine hcl) .Marland Kitchen.. 1 or 2 tablets every 6 hours for neck stiffness and pain.  Patient Instructions: 1)  sore and stiff neck: no neurologic findings to suggest any disk disease. Suspect arthritic source of pain and stiffness acutely. Plan - x-rays of neck; cyclobenzaprine 5 mg 1 or 2 tablets every 8 hours as needed for stiffness; ok to take 1 or 2 aleve every 12 hours; heat or cold whichever seems to help the most. Do "neck rolls" for 5-10 times each direction  2 or 3 times a day.  Prescriptions: CYCLOBENZAPRINE HCL 5 MG TABS (CYCLOBENZAPRINE HCL) 1 or 2 tablets every 6 hours for neck stiffness and pain.  #60 x 1   Entered and Authorized by:   Jacques Navy MD   Signed by:   Jacques Navy MD on 07/01/2010   Method used:   [facility] to        [facility]  [place] (retail)       [address]       [place], [place]  [address]       Ph: [phone] or [phone]       Fax: [phone]   RxID:   [id number]    Orders Added: 1)  T-Cervical Spine Comp w/Flex & Ext [72052TC] 2)  Est. Patient Level III [[address]]

## 2010-10-18 ENCOUNTER — Telehealth: Payer: Self-pay | Admitting: *Deleted

## 2010-10-18 NOTE — Telephone Encounter (Signed)
error 

## 2010-11-19 ENCOUNTER — Other Ambulatory Visit: Payer: Self-pay | Admitting: Internal Medicine

## 2010-11-19 NOTE — Telephone Encounter (Signed)
Refill request for Cyclobenzaprine. Please advise.

## 2010-11-21 NOTE — Telephone Encounter (Signed)
May refill x3

## 2010-11-22 ENCOUNTER — Telehealth: Payer: Self-pay | Admitting: *Deleted

## 2010-11-22 MED ORDER — CYCLOBENZAPRINE HCL 5 MG PO TABS: 5.0000 mg | ORAL_TABLET | Freq: Three times a day (TID) | ORAL | Status: DC | PRN

## 2010-11-22 NOTE — Telephone Encounter (Signed)
Fax from [facility] [address] in [place] [phone]. Cyclobenzaprine 5 mg tablet. QTY 60 SIG take one to two tablets every 6 hours for neck stiffness and pain. Last fill was 07/01/2010. Last ov was 07/29/2010. Please Advise refills

## 2010-11-22 NOTE — Telephone Encounter (Signed)
Ok x 5 

## 2010-11-23 NOTE — Assessment & Plan Note (Signed)
[facility]                            CARDIOLOGY OFFICE NOTE   MAXIMUM, REILAND                        MRN:          [id number]  DATE:01/09/2007                            DOB:          1935-03-01    FOLLOW-UP VISIT:   PRIMARY CARE PHYSICIAN:  Rosalyn Gess. Norins, M.D.   REASON FOR VISIT:  Cardiology followup.   HISTORY OF PRESENT ILLNESS:  Mr. Ratledge has been relatively stable since  his last visit.  He has occasional exertional chest pain but describes  this as mild and denies having to use any sublingual nitroglycerin.  He  continues on the medicines outlined below.  He asked me about Plavix  today, regarding relatively easy bruising, and we did talk about  discontinuing the medication, since he is over two years out from his  intervention.  He has a STATIN INTOLERANCE as well as intolerance to  ZETIA.  We have been unable to aggressively manage his LDL.  He does  take red yeast rice extract and flaxseed oil extract.  We have talked  about diet and exercise otherwise.   ALLERGIES:  STATINS, ZETIA intolerance.   MEDICATIONS:  1. Atenolol/HCTZ 50/25 mg p.o. daily.  2. Aspirin 325 mg p.o. daily.  3. K-Dur 20 mEq two tablets p.o. b.i.d.  4. Plavix 75 mg p.o. daily.  5. Imdur 30 mg p.o. daily.  6. Flaxseed oil and red yeast rice extract.  7. Imdur 30 mg p.o. daily.  8. Nexium 40 mg p.o. daily.  9. Sublingual nitroglycerin 0.4 mg p.r.n.   REVIEW OF SYSTEMS:  As described in the history of present illness.   PHYSICAL EXAMINATION:  VITAL SIGNS:  Blood pressure 100/62, heart rate  73, weight is 199 pounds.  GENERAL:  Patient is comfortable in no acute distress.  NECK:  Examination of the neck reveals no elevated jugular venous  pressure.  LUNGS:  Clear without labored breathing at rest.  CARDIAC:  Regular rate and rhythm.  No loud murmur or gallop.  EXTREMITIES:  No pitting edema.   IMPRESSION:  1. Coronary artery disease, status post  drug-eluting stent placement      to the left anterior descending with residual proximal disease as      well as moderate disease in the right coronary artery that has been      managed medically.  He is stable symptomatically.  We will plan to      discontinue Plavix at this point.  I will plan to see him back over      the next six months for symptom review.  Last Myoview was in      January, 2006.  We may consider a follow-up study at his next      visit.  2. Hyperlipidemia, with statin and Zetia intolerance:  His LDL was 114      in January.  Unfortunately, I suspect it will be very difficult to      optimally bring around 70, given his medication intolerances.  He      continues on red yeast rice extract  and flaxseed extract.  We have      already talked about diet and exercise.     Jonelle Sidle, MD  Electronically Signed    SGM/MedQ  DD: 01/09/2007  DT: 01/10/2007  Job #: [id number]   cc:   Rosalyn Gess. Norins, MD

## 2010-11-23 NOTE — Assessment & Plan Note (Signed)
[facility]                           PRIMARY CARE OFFICE NOTE   HUTCHINSON, ISENBERG                        MRN:          [id number]  DATE:11/28/2006                            DOB:          1935-06-19    Mr. [name] is a soon to be 76 year old, Caucasian gentleman who presents  for followup evaluation and exam. He was last seen December 09, 2005, please  see that dictation. In the interval, the patient has been followed on a  regular basis by Dr. Diona Browner for cardiology with his last visit being  July 25, 2006. The patient is status post drug-eluting stent  placement to the LAD with some residual proximal disease as well as  moderate disease in the RCA. At his last visit, he was felt to be  stable. The patient does have multiple medication intolerances in  regards to his treatment for hyperlipidemia but is currently on a  reasonable strategy.   The patient reports he is feeling well. He has no active complaints at  this time and is here at the urging of his wife and other support  people.   PAST MEDICAL HISTORY:  No surgeries are reported.   MEDICAL:  1. Usual childhood diseases.  2. CAD status post CYPHER stent to the LAD and PTCA of the diagonal.  3. Hyperlipidemia.  4. Hypertension.  5. Diabetes.  6. GERD.  7. Degenerative joint disease.   CURRENT MEDICATIONS:  1. Atenolol HCTZ 50/25 once daily.  2. Aspirin 325 mg daily.  3. K-Dur 20 mEq b.i.d.  4. Plavix 75 mg daily.  5. Flax seed oil.  6. Imdur 30 mg daily.  7. Benazepril 20 mg daily.  8. Nexium 40 mg daily.  9. Red yeast rice b.i.d.  10.Sublingual nitroglycerin as needed.   REVIEW OF SYSTEMS:  Negative for any constitutional, cardiovascular,  respiratory, GI or GU problems.   PHYSICAL EXAMINATION:  VITAL SIGNS:  Temperature was 97, blood pressure  105/64, pulse 63, weight 198.5.  GENERAL:  This is a well-nourished, well-developed, mildly overweight,  Caucasian male in no acute  distress.  HEENT:  Normocephalic, atraumatic. EACs and TMs were normal. Oropharynx  without lesions. Conjunctiva and sclera was clear. Pupils equal round  and reactive to light and accommodation.  NECK:  Supple without thyromegaly.  NODES:  No adenopathy was noted in the cervical or supraclavicular  regions.  CHEST:  With CVA tenderness.  LUNGS:  Clear to auscultation and percussion.  CARDIOVASCULAR:  2+ radial pulses, no JVD or carotid bruits. He had a  quiet precordium with a regular rate and rhythm without murmurs, rubs or  gallops.  ABDOMEN:  Soft, no guarding, or rebound. No organosplenomegaly was  noted.  RECTAL:  Normal sphincter tone was noted. The prostate was smooth,  round, normal in size and contour without nodules.  EXTREMITIES:  Unremarkable.   LABORATORY DATA:  Last laboratory from July 19, 2006 revealed normal  liver functions. Cholesterol was reasonable with an LDL of 114 and this  is under the direction of Dr. Diona Browner.   ASSESSMENT/PLAN:  1. Cardiovascular  stable. The patient is to followup with Dr. Diona Browner      as indicated. Continue his present medications.  2. Lipids. The patient is currently doing well although he is not at      goal which could be an LDL of 80 or less. Will defer this to the      management of Dr. Diona Browner.  3. Hypoglycemia. The patient's last serum glucose was 133. No recent      hemoglobin A1c is available. The patient is sent to the laboratory      today for both a lipid panel as well as an A1c, results are      pending. We will make recommendations based on his lab results. He      definitely needs to be following a no sugar diet.  4. Hypertension. The patient has been chronically hypotensive. He is      minimally symptomatic with positional dizziness. Dr. Ival Bible      notes indicate he should be on Benazepril 10 mg a day but the      patient has not been able to split a 20 mg tablet. The patient is      given a new prescription  for Benazepril 10 mg.  5. Gastrointestinal:  Patient with stable gastroesophageal reflux      disease on Nexium.  6. Health maintenance:  The patient would be a candidate for      colorectal cancer screening. He last had a flexible sigmoidoscopy      in 1986. I think he would benefit from colonoscopy. We would be      happy to schedule this for him at his convenience once he notifies      me he is going to proceed.   In summary, he is a very pleasant gentleman with medical problems  outlined above who does seem medically stable. He will be notified of  his labs by phone. He is asked to return to see me on a p.r.n. basis.     Rosalyn Gess Norins, MD  Electronically Signed    MEN/MedQ  DD: 11/29/2006  DT: 11/29/2006  Job #: [id number]   cc:   Rhea Bleacher

## 2010-11-23 NOTE — Assessment & Plan Note (Signed)
[facility]                            CARDIOLOGY OFFICE NOTE   JAYANTH, SZCZESNIAK                        MRN:          [id number]  DATE:08/27/2007                            DOB:          30-Jan-1935    PRIMARY CARE PHYSICIAN:  Dr. Illene Regulus.   REASON FOR VISIT:  Follow-up coronary artery disease.   HISTORY OF PRESENT ILLNESS:  I saw Mr. Devilla back in July of last year.  He is not reporting any progressive angina or breathlessness.  His  electrocardiogram today is normal showing sinus rhythm at 67 beats per  minute.  Medications are outlined below and are unchanged.  He has had  less bruising since discontinuing Plavix.  We talked about a follow-up  stress test around this time given that his intervention was back in  2006, although on the other hand he is having no new symptoms and in  reviewing his prior testing, he actually had normal studies just prior  to the diagnosis of obstructive coronary disease.  We decided that  observation might make most sense at this time and certainly if he has  progressive symptoms we might need to consider an angiogram.   ALLERGIES:  VYTORIN, IMODIUM, LIPITOR (generally all STATINS that have  been tried).  Also intolerance to ZETIA.   MEDICATIONS:  Aspirin 325 mg, K-Dur 20 mEq b.i.d., atenolol  hydrochlorothiazide 50/25 mg p.o. daily, flax seed oil twice daily,  Imdur 30 mg p.o. daily, Nexium 40 mg p.o. daily, red yeast rice extract,  vitamin E, nitroglycerin 0.4 mg sublingual p.r.n.   REVIEW OF SYSTEMS:  As in the history of present illness.  Otherwise,  negative.   PHYSICAL EXAMINATION:  Blood pressure 126/71, heart 67, weight 205  pounds.  An overweight male no acute distress.  Examination of the neck reveals no elevated jugular venous pressure or  loud bruits.  No thyromegaly was noted.  LUNGS:  Clear without labored breathing at rest.  CARDIAC:  Regular rate and rhythm.  No loud murmur or gallop.  ABDOMEN:  Obese and nontender.  Normoactive bowel sounds.  EXTREMITIES:  No significant pitting edema.   IMPRESSION/RECOMMENDATIONS:  Coronary artery disease status post drug-  eluting stent placed in the left anterior descending back in 2006 with  residual proximal disease as well as moderate disease in the right  coronary artery.  The patient is stable symptomatically on medical  therapy.  He is now off Plavix and taking aspirin alone.  He is not  reporting any progressive angina or breathlessness.  Medical regimen  does not include a statin or Zetia given prior  documented intolerances and he is trying to work on diet and continues  to take red yeast rice extract.  Will be plan a follow-up visit in 6  months for symptom review.     Jonelle Sidle, MD  Electronically Signed    SGM/MedQ  DD: 08/27/2007  DT: 08/28/2007  Job #: [id number]   cc:   Rosalyn Gess. Norins, MD

## 2010-11-26 NOTE — Cardiovascular Report (Signed)
[name], [name] NO.:  [id number]   MEDICAL RECORD NO.:  [id number]          PATIENT TYPE:  OIB   LOCATION:  2899                         FACILITY:  [facility]   PHYSICIAN:  Carole Binning, M.D. LHCDATE OF BIRTH:  Feb 07, 1935   DATE OF PROCEDURE:  07/09/2004  DATE OF DISCHARGE:                              CARDIAC CATHETERIZATION   PROCEDURE PERFORMED:  Left heart catheterization with coronary angiography  and left ventriculography.   INDICATIONS FOR PROCEDURE:  [name] is a 76 year old male with a history  of coronary artery disease and prior stent to the left anterior descending  artery.  He presented to the office yesterday with symptoms of progressive  exertional chest pain.  He is, thus, referred for cardiac catheterization.   PROCEDURE NOTE:  A 6 French sheath was placed in the right femoral artery.  Coronary angiography was performed using standard Judkins 6 French  catheters.  Left ventriculography was performed with an angled pigtail  catheter.  Contrast was Omnipaque.  At the conclusion of the procedure, a 6  [place] Angio-Seal vascular closure device was placed in the right femoral  artery with good hemostasis.  There were no complications.   RESULTS:  Hemodynamics:  Left ventricular  pressure 136/22, aortic pressure 146/80,  there was no aortic gradient.   Left ventriculogram:  Wall motion is normal.  Ejection fraction is estimated  at greater than or equal to 60%.  There is no mitral regurgitation.   Coronary angiography:   Left main is normal.   Left anterior descending artery has a 60-70% stenosis in the proximal  vessel.  Just beyond this, in the mid vessel, there is a stent which is  patent.  In the distal portion of the stent, there is a 30% stenosis.  The  distal LAD has a diffuse 20-30% stenosis.  The LAD gives rise to a small  first diagonal and normal size second diagonal branch.  The second diagonal  branch has a 30% stenosis at  its ostium.   Left circumflex gives rise to a single large obtuse marginal branch.  The  obtuse marginal branch has a 20% stenosis proximally.   The right coronary artery is a dominant vessel.  There is a 20% stenosis in  the proximal vessel and a 50% in the proximal to mid vessel.  In the very  distal AV groove portion of the right coronary artery, there are diffuse  luminal irregularities.  The distal right coronary artery gives rise to a  large posterior descending artery and a small first posterolateral branch  and a small to normal size second posterolateral branch, and a small third  posterolateral branch.   IMPRESSION:  1.  Normal left ventricular systolic function.  2.  Moderate two vessel coronary artery disease as described which does not      appear to be hemodynamically significant.   PLAN:  For medical therapy.  We will proceed with a follow up stress nuclear  scan to rule out ischemia in the LAD or right coronary artery distribution.      Loraine Leriche  MWP/MEDQ  D:  07/09/2004  T:  07/09/2004  Job:  [id number]   cc:   Rosalyn Gess. Norins, M.D. [facility]

## 2010-11-26 NOTE — Assessment & Plan Note (Signed)
[facility]                            CARDIOLOGY OFFICE NOTE   CLARY, MEEKER                        MRN:          [id number]  DATE:07/25/2006                            DOB:          May 25, 1935    PRIMARY CARE PHYSICIAN:  Dr. Illene Regulus.   REASON FOR VISIT:  Cardiac followup.   HISTORY OF PRESENT ILLNESS:  Mr. Swaney was in the office back in July of  2007.  He overall is doing fairly well, although he does have some  exertional angina on occasion.  He has had to use 1 single sublingual  nitroglycerin since I last saw him.  His electrocardiogram today is,  however, stable showing sinus rhythm at 64 beats per minute.  Followup  lipids off of any Statin therapy shows an LDL cholesterol now at 114, up  from 90 back in 2006.  He has, unfortunately, had significant Statin  intolerance as well as problems with the Zetia.  I spoke with him today  about being very aggressive with diet.  He had not had any problems with  palpitations, orthopnea, or PND.   ALLERGIES:  STATIN intolerance as well as problems with the ZETIA.   PRESENT MEDICATIONS:  1. Tenoretic 50/25 mg p.o. daily.  2. Aspirin 325 mg p.o. daily.  3. K-Dur 40 mg p.o. b.i.d.  4. Plavix 75 mg p.o. daily.  5. Flax seed oil extract.  6. Imdur 30 mg p.o. daily.  7. Benazepril 10 mg p.o. daily.  8. Nexium 40 mg p.o. daily.  9. Sublingual nitroglycerin 0.4 mg p.r.n.   REVIEW OF SYSTEMS:  As described in the history of present illness.   EXAMINATION:  Blood pressure today is 112/68, heart rate is 64, weight  is 199 pounds.  The patient is comfortable and in no acute distress.  HEENT:  Conjunctivae looks normal.  Oropharynx is clear.  NECK:  Supple without elevated jugular venous pressure or loud bruits.  No thyromegaly is noted.  LUNGS:  Clear without labored breathing at rest.  CARDIAC:  Regular rate and rhythm without rub, murmur, or gallop.  ABDOMEN:  Soft, nontender.   Normoactive bowel sounds.  EXTREMITIES:  No significant cyanosis, clubbing, or edema.   IMPRESSION/RECOMMENDATIONS:  1. Coronary artery disease status post drug-eluting stent placement to      the left anterior descending with residual proximal disease as well      as moderate disease in the right coronary artery.  I have asked him      to continue his present regimen, including Plavix.  If he has more      consistent angina, I have recommended that he increase his Imdur to      30 mg p.o. b.i.d. and let us know.  It may be that he will require      repeat ischemic evaluation.  His last Myoview was in January 2006      and showed no evidence of ischemia.  I will otherwise plan to see      him back over the next 6 months.  2. Hyperlipidemia  with medication intolerances.  I have asked him to      continue with a strategy of diet control and work on weight loss.      He could be considered for Omega 3 fish oil supplements.     Jonelle Sidle, MD  Electronically Signed    SGM/MedQ  DD: 07/25/2006  DT: 07/25/2006  Job #: [id number]   cc:   Rosalyn Gess. Norins, MD

## 2010-11-26 NOTE — Cardiovascular Report (Signed)
[facility]  Patient:    [name], [name]                        MRN: [id number] Proc. Date: 04/19/00 Adm. Date:  [id number] Attending:  Daisey Must CC:         Rosalyn Gess. Norins, M.D. [facility]  Cardiac Catheterization Lab   Cardiac Catheterization  PROCEDURE PERFORMED:  Left heart catheterization with coronary angiography and left ventriculography.  INDICATIONS:  Mr. Shouse is a 76 year old male who has been having atypical left arm discomfort.  A recent stress Cardiolite in the office showed possible mild inferior ischemia.  PROCEDURAL NOTE:  A 6-French sheath was placed in the right femoral artery. Standard Judkins 6-French catheters were utilized.  Contrast was Omnipaque. There were no complications.  CATHETERIZATION RESULTS:  Hemodynamics:  Left ventricular pressure 136/24.  Aortic pressure 142/84. There was no aortic valve gradient.  Left ventriculogram:  Wall motion is normal.  Ejection fraction estimated at greater than 60%.  There is trace mitral regurgitation.  Coronary arteriography (right dominant): 1. The left main has minor luminal irregularities. 2. The left anterior descending artery has an ostial 50% stenosis.  The mid    vessel has a 20% stenosis between the first and second diagonal.  Further    down in the mid vessel just after the second diagonal is a 50% stenosis.    The first diagonal is a small vessel, and the second diagonal is normal in    size.  The second diagonal has a 60% stenosis at its origin. 3. The left circumflex gives rise to a small OM-1, small OM-2, and a large    OM-3.  The left circumflex has minor luminal irregularities. 4. The right coronary artery gives rise to a large posterior descending    artery, a small posterolateral branch, and a normal branching second    posterolateral branch.  In the proximal right coronary is a 20% stenosis    followed by a second 20% stenosis in the mid vessel.  The distal  vessel has    minor luminal irregularities.  IMPRESSIONS: 1. Normal left ventricular systolic function. 2. Nonobstructive coronary artery disease as described.  PLAN:  Medical therapy. DD:  04/19/00 TD:  04/20/00 Job: [id number]

## 2010-11-26 NOTE — Discharge Summary (Signed)
NAME:  [name], [name]                           ACCOUNT NO.:  [id number]   MEDICAL RECORD NO.:  [id number]                   PATIENT TYPE:  INP   LOCATION:  6524                                 FACILITY:  [facility]   PHYSICIAN:  Jesse Sans. Wall, M.D.                DATE OF BIRTH:  06/26/1935   DATE OF ADMISSION:  07/16/2003  DATE OF DISCHARGE:  07/18/2003                           DISCHARGE SUMMARY - REFERRING   PROCEDURES:  Coronary artery stenting, January 6th.   REASON FOR ADMISSION:  Please refer to dictated admission note.   LABORATORY DATA:  Sodium 136, potassium 2.9, glucose 139, BUN 11, creatinine  1.0 at discharge.  WBC 9.4, hemoglobin 13.6, platelets 228 at discharge.  Potassium 3.1 on admission.  Hemoglobin A1C of 6.9.  Serial cardiac markers  normal.  Lipid profile:  C-reactive protein negative (0.2).   ADMISSION CHEST X-RAY:  NAD.   HOSPITAL COURSE:  Following initial presentation to [facility]  emergency room for evaluation of exertional chest pain, patient ruled out  for a myocardial infarction with all serial cardiac markers within normal  limits.  Patient had a previous coronary angiogram in 2001 by Dr. Loraine Leriche  Pulsipher revealing moderate nonobstructive two-vessel coronary artery  disease with normal left ventricular function.  More recently, he underwent  an exercise stress Cardiolite test on December 10th for evaluation of  recurrent chest pain, revealing no perfusion abnormalities with normal left  ventricular function.  The patient was thus referred for re-look coronary  angiogram.  This was performed by Dr. Veneda Melter (see report for full  details) revealing significant single-vessel coronary artery disease with an  80% mid LAD lesion.  This was successfully dilated with a CYPHER stent.  Achieved 0% residual stenosis with no noted complications.  Patient was  enrolled in the Acute trial.   Patient was also noted to have an 80% second diagonal lesion  which was  treated with balloon angioplasty to 50% residual stenosis.   Patient will need to remain on Plavix for at least six months.   Patient was kept overnight for observation and cleared for discharge the  following morning.  Of note, he had significant hypokalemia (2.9) and  received two doses of 40 mEq potassium prior to discharge.  He will need a  follow-up BMET in one week.   DISCHARGE MEDICATIONS:  1. Plavix 75 mg q.d.  least 6 months).  2. Coated aspirin 325 mg q.d.  3. Tenoretic 50/25 mg q.d.  4. Protonix 40 mg q.d.  5. Vytorin 10/40 mg q.d.  6. K-Dur 20 mEq q.d.  7. Nitrostat 0.4 mg p.r.n.   INSTRUCTIONS:  1. No heavy lifting/driving x2 days.  2. Low-fat, low-cholesterol diet.  3. Call the office if there is any swelling or bleeding.   Patient is scheduled to follow up with Dr. Veneda Melter / [facility], on  Friday, January 21 at  12 noon.  He will need a follow-up BMET in one week.   Patient will follow up with Dr. Chales Abrahams in three months.   Patient is instructed to follow up with his primary care physician, Dr.  Illene Regulus, in four weeks.   DISCHARGE DIAGNOSES:  1. Single-vessel coronary artery disease.     A. Status post stent, CYPHER, 80% mid left anterior descending artery,        80% second diagonal, January 6th.     B. Normal left ventricular function.     C. Moderate nonobstructive coronary artery disease, cardiac        catheterization in 2001.  2. Hypokalemia.  3. Dyslipidemia.  4. Hypertension.  5. Type 2 diabetes mellitus.  6. Remote tobacco.      Gene Serpe, P.A. [facility]                      Thomas C. Wall, M.D.    GS/MEDQ  D:  07/18/2003  T:  07/18/2003  Job:  [id number]   cc:   Rosalyn Gess. Norins, M.D. [facility]

## 2010-11-26 NOTE — Assessment & Plan Note (Signed)
[facility]                              CARDIOLOGY OFFICE NOTE   NAME:[name], [name]                        MRN:          [id number]  DATE:                                      DOB:          Jun 26, 1935    NO DICTATION:                                Jonelle Sidle, MD    SGM/MedQ  DD:  02/01/2006  DT:  02/01/2006  Job #:  [id number]

## 2010-11-26 NOTE — Cardiovascular Report (Signed)
NAME:  [name], [name]                           ACCOUNT NO.:  [id number]   MEDICAL RECORD NO.:  [id number]                   PATIENT TYPE:  INP   LOCATION:  6599                                 FACILITY:  [facility]   PHYSICIAN:  Veneda Melter, M.D.                   DATE OF BIRTH:  1935/01/17   DATE OF PROCEDURE:  07/17/2003  DATE OF DISCHARGE:                              CARDIAC CATHETERIZATION   PROCEDURES PERFORMED:  1. Left heart catheterization.  2. Left ventriculogram.  3. Selective coronary angiography.  4. PTCA and stent placement mid left anterior descending.  5. PTCA of second diagonal branch.   DIAGNOSES:  1. Single vessel coronary artery disease.  2. Normal left ventricular systolic function.  3. Unstable angina.   HISTORY:  [name] is a 76 year old gentleman who has had crescendo and now  unstable angina.  The patient recently underwent stress imaging study in  December of 2004 showing no significant ischemia and well preserved LV  function.  Due to persistence of symptoms and pain at rest he was admitted  to the hospital.  He ruled out for acute myocardial infarction and he  presents now for further assessment.   TECHNIQUE:  Informed consent was obtained.  The patient was brought to the  catheterization laboratory.  A 6-French sheath placed in the right femoral  artery using modified Seldinger technique.  A 6-French JL4 and JR4 catheter  was then used to engage the left and right coronary arteries.  Selective  angiography performed in various projections using manual injections of  contrast.  A 6-French pigtail catheter was then advanced in the left  ventricle and left ventriculogram performed using power injections of  contrast.   FINDINGS:  1. Left main trunk:  Medium caliber vessel with mild disease of 20%.  2. LAD:  This is a medium caliber vessel in the proximal segment that     provides two diagonal branches and then tapers as it extends to the apex.     The  LAD has diffuse disease along its entire course with narrowings of     40% in the proximal segment near the takeoff of a small first diagonal     branch.  There is then severe disease in the mid section of 80-90%     involving the bifurcation with the second diagonal branch.  The distal     LAD then has mild diffuse disease of 30%.  The first diagonal branch has     mild narrowings of 30%.  The second diagonal branch has an ostial     narrowing of 80-90%.  3. Left circumflex artery:  This is a large caliber vessel.  Consists of     large marginal branch in the mid section.  Left circumflex system has     disease of up to 30%.  4. Right coronary artery:  Dominant.  This is a large caliber vessel.     Provides posterior descending artery and a posterior ventricular branch     in terminal segment.  The right coronary artery has moderate disease of     30-40% in the mid section with narrowings of 20-30% in the PDA.  5. LV:  Normal end-systolic and end-diastolic dimensions.  Overall left     ventricular function is well preserved.  Ejection fraction greater than     55%.  No mitral regurgitation.  LV pressure is 110/5.  Aortic is 110/65.     LVEDP equals 15.   PERCUTANEOUS INTERVENTION:  With these findings we elected to proceed with  percutaneous intervention of the mid LAD.  The patient had been enrolled in  the ACUITY study and had been randomized to Angiomax.  This was bolused  according to protocol.  He had been pretreated with aspirin.  He was given  600 mg of Plavix.  A 6-French sheath was exchanged for a 7-French sheath and  7-French Voda left 3.5 guide catheter used to engage the left coronary  artery.  A 0.01 inch Asahi medium wire was advanced in the first diagonal  branch while a 0.014 inch Forte wire was advanced into the LAD.  This was  used to size vessel diameter and lesion length.  Forte wire was positioned  distally.  A 2.5 x 9 mm Maverick balloon was then used to pre dilate  the  first diagonal branch.  A total of three inflations were performed at up to  8 atmospheres for 60 seconds.  Repeat angiography showed significant  improvement in vessel diameter.  A 2.5 x 23 mm Cypher stent was then  introduced and positioned in the LAD.  The wire in the diagonal was  retracted and the Cypher stent deployed in the LAD at 12 atmospheres for 30  seconds.  Repeat angiography showed an excellent result with significant  improvement in vessel lumen and at this point no compromise of flow into the  diagonal branch.  A 2.75 x 15 mm Quantum Maverick balloon was then used to  post dilate the stent at 14 and 16 atmospheres, respectively for 30 seconds  each in the distal and proximal segments.  Repeat angiography at this point  now showed slightly reduced flow in the second diagonal branch.  The Asahi  wire was then repositioned in the second diagonal branch and the 2.5 x 9 mm  Maverick balloon used to dilate the ostium at 6 atmospheres for 30 seconds.  Repeat angiography showed significant improvement in flow to TIMI grade 3  through this diagonal branch.  However, there was evidence of linear type A  dissection at the ostium.  This was not flow compromising, however.  Repeat  angiography was performed in various projections after the administration of  intracoronary nitroglycerin showing an excellent result with no residual  stenosis in the mid LAD and TIMI 3 flow through the LAD and diagonal branch.  The patient had substantial relief of discomfort following the procedure and  we elected to leave the diagonal branch alone at this point.  The guide  catheter was removed and the sheath secured in position.  The patient  tolerated procedure well and was transferred to floor in stable condition.   FINAL RESULT:  Successful PTCA and stent placement mid left anterior  descending with reduction of 80-90% narrowing to 0% with placement of a 2.5 x 23 mm Cypher drug-eluting stent  dilated to 2.75  mm with adjuvant PTCA of  the second diagonal branch.   ASSESSMENT/PLAN:                                               Veneda Melter, M.D.    NG/MEDQ  D:  07/17/2003  T:  07/17/2003  Job:  [id number]   cc:   Rosalyn Gess. Norins, M.D. [facility]   Carole Binning, M.D. [facility]

## 2010-11-26 NOTE — H&P (Signed)
NAME:  [name], [name]                           ACCOUNT NO.:  [id number]   MEDICAL RECORD NO.:  [id number]                   PATIENT TYPE:  INP   LOCATION:  1845                                 FACILITY:  [facility]   PHYSICIAN:  Jesse Sans. Wall, M.D.                DATE OF BIRTH:  [birth year]   DATE OF ADMISSION:  07/16/2003  DATE OF DISCHARGE:                                HISTORY & PHYSICAL   CHIEF COMPLAINT:  Chest pain.   HISTORY OF PRESENT ILLNESS:  This is a 76 year old male who has a history of  chest pain.  He is followed by Rosalyn Gess. Norins, M.D., his primary care  physician.  He underwent cardiac catheterization performed by Carole Binning, M.D., in October of 2001.  At that time, he had mild to moderate  coronary artery disease and he was treated medically.   The patient recently had an exercise Cardiolite performed on June 20, 2003, that was negative for ischemia with an normal EF.  He has continued to  have exertional chest pain.  His symptoms appeared to be getting worse.  He  called Dr. Debby Bud today and was seen in his office.  Dr. Debby Bud talked with  Dr. Corinda Gubler.  The decision was made to admit the patient this evening for  cardiac catheterization tomorrow.   PAST MEDICAL HISTORY:  1. The patient had a previous cardiac catheterization in October of 2001 as     noted above.  2. History of elevated lipids.  3. History of hypertension.  4. Remote history of tobacco use.  5. History of diabetes mellitus.  6. Degenerative joint disease.  7. Gastrointestinal reflux disease.  8. He has had previous knee surgery.  9. He had a crushed clavicle and scapula fracture in 1968.  10.      He is somewhat hard of hearing.   ALLERGIES:  No known drug allergies.   CURRENT MEDICATIONS:  1. Tenoretic 50/25 mg daily.  2. Aspirin 81 mg daily.  3. Prilosec 40 mg.  4. K-Dur 20 mEq daily.  5. Zetia 10 mg daily.   SOCIAL HISTORY:  The patient is married.  He has three  children.  [place]  He does yardwork and stays fairly active.  He quit smoking 20 years ago.  He denies alcohol use.   FAMILY HISTORY:  The patient's father had coronary artery disease, as well  as prostate CA.  His mother died from COPD and heart failure.   REVIEW OF SYSTEMS:  Essentially negative, except for the following:  He has  had occasional dizziness, especially when standing.  He has mild hearing  deficiencies.  He has had recent presyncope.  Chest pain as noted above.  He  has some arthralgias.  History of gastrointestinal reflux disease and hiatal  hernia.  He has occasional diarrhea.  He has heat  intolerance.   PHYSICAL EXAMINATION:  GENERAL APPEARANCE:  A pleasant, 76 year old, white  male in no acute distress.  VITAL SIGNS:  Blood pressure 95/59, pulse 65.  HEENT:  Unremarkable.  NECK:  No bruits.  No jugular venous distention.  HEART:  Regular rate and rhythm without murmur.  LUNGS:  Clear.  ABDOMEN:  Slightly obese, soft, and nontender with positive bowel sounds.  EXTREMITIES:  Pulses intact without edema.  SKIN:  Warm and dry.   LABORATORY DATA:  Chest x-ray is pending.  EKG shows sinus bradycardia and  rate 56 beats per minute without ischemia.  Other laboratories are  significant for an INR of 1.0.  The PTT is 33.  The CBC reveals hemoglobin  14.1, hematocrit 40.9, WBC 7300, and platelets 240,000.  Initial cardiac  enzymes are negative.  The chemistry panel is pending at this time.   IMPRESSION:  1. Exertional chest pain consistent with unstable angina.  2. Cardiac catheterization performed in October of 2001 revealing mild to     moderate coronary artery disease.  Ejection fraction 60% with trace     mitral regurgitation.  3. Positive family history of coronary artery disease.  4. Diabetes mellitus.  5. Elevated lipids.  6. Hypertension.  7. Remote tobacco history.  8. Degenerative joint disease.  9. Gastrointestinal  reflux disease.  10.      Previous knee surgery.  11.      Mild hypotension and orthostatic-type symptoms.   PLAN:  The patient is to be scheduled for cardiac catheterization, the first  case tomorrow.  His hydrochlorothiazide will be held secondary to a mildly  low blood pressure and some orthostatic symptoms.  We will gently hydrate  him and plan catheterization tomorrow.      Delton See, P.A. [facility]                  Thomas C. Wall, M.D.    DR/MEDQ  D:  07/16/2003  T:  07/16/2003  Job:  [id number]   cc:   Rosalyn Gess. Norins, M.D. [facility]

## 2010-11-26 NOTE — Assessment & Plan Note (Signed)
[facility]                              CARDIOLOGY OFFICE NOTE   [name], [name]                        MRN:          [id number]  DATE:02/01/2006                            DOB:          1935-04-13    PRIMARY CARE PHYSICIAN:  Illene Regulus, M.D.   REASON FOR VISIT:  Cardiac followup.   HISTORY OF PRESENT ILLNESS:  I saw Mr. Cui back in April. He continues to  do well with history of coronary artery disease as previously outlined. He  is not having any exertional angina or shortness of breath. We talked about  his medical regimen today and I have recommended that he continue Plavix, at  least for a total duration of 2 years, given his drug eluting stent to the  left anterior descending in December of 2005. He is having some other  complaints including myalgias and abdominal pain and has been working  through medication adjustments with Dr. Debby Bud. At present, he is off both  Crestor and Zetia and has had some improvement in his myalgias. He continues  to have problems with occasional abdominal discomfort and we talked about  potentially the possibility of Nexium contributing to this. I asked him to  review this with Dr. Debby Bud, however. I doubt that his Atenolol is to blame  at this point. He states that he has been watching his blood pressure at  home and has not noted any major elevations. He is not taking benazepril at  present.   ALLERGIES:  General STATIN intolerance. He has had issues with LIPITOR,  VYTORIN, and now, CRESTOR.   MEDICATIONS:  1.  Aspirin 325 mg p.o. daily.  2.  Atenolol/HCT 20/25 mg p.o. daily.  3.  K-Dur 20 meq 2 tablets p.o. b.i.d.  4.  Plavix 75 mg p.o. daily.  5.  Flax seed oil extract.  6.  Imdur 30 mg p.o. daily.   REVIEW OF SYSTEMS:  As in history of present illness. All other systems  negative.   PHYSICAL EXAMINATION:  VITAL SIGNS:  Blood pressure today is 134/72, heart  rate is 68. Weight is 197  pounds.  GENERAL:  The patient is in no acute distress.  NECK:  Examination reveals no obvious jugular venous pressure.  LUNGS:  Clear without labored breathing.  CARDIAC:  Examination reveals a regular rate and rhythm without loud murmur  or gallop.  ABDOMEN:  Soft with no bruit.  EXTREMITIES:  No significant pitting edema.   IMPRESSION/RECOMMENDATIONS:  1.  Coronary artery disease status post drug eluting stent placement to the      left anterior descending with residual proximal disease as well as      moderate disease of the right coronary artery. Plan is for continuing      Plavix, at least for a total duration of 2 years. Based on his records,      his stent was placed in January of 2005, on my review now. He has been      tolerating this and I doubt if it is related to his above complaints.  I      would generally prefer continuing it for now, to reduce his risk of late      stent thrombosis. I will plan to see him back in 6 months and we can      discuss the situation again at that time.  2.  Hyperlipidemia with significant Statin intolerance. He is taking neither      Crestor or Zetia at this time. He seems more focused on a strategy of a      diet and exercise. I have reviewed with him, the fact that he needs      fairly aggressive lipid management, given his coronary disease and that      this will need to be re-visited down the road.                                Jonelle Sidle, MD    SGM/MedQ  DD:  02/01/2006  DT:  02/01/2006  Job #:  [id number]   cc:   Rosalyn Gess. Norins, MD

## 2010-12-14 ENCOUNTER — Encounter: Payer: Self-pay | Admitting: Cardiovascular Disease

## 2011-01-13 ENCOUNTER — Encounter: Payer: Self-pay | Admitting: Internal Medicine

## 2011-01-13 ENCOUNTER — Ambulatory Visit (INDEPENDENT_AMBULATORY_CARE_PROVIDER_SITE_OTHER): Payer: Medicare Other | Admitting: Internal Medicine

## 2011-01-13 VITALS — BP 118/72 | HR 64 | Temp 97.3°F | Wt 190.0 lb

## 2011-01-13 DIAGNOSIS — Z1211 Encounter for screening for malignant neoplasm of colon: Secondary | ICD-10-CM

## 2011-01-13 DIAGNOSIS — K219 Gastro-esophageal reflux disease without esophagitis: Secondary | ICD-10-CM

## 2011-01-13 DIAGNOSIS — I2510 Atherosclerotic heart disease of native coronary artery without angina pectoris: Secondary | ICD-10-CM

## 2011-01-13 DIAGNOSIS — E119 Type 2 diabetes mellitus without complications: Secondary | ICD-10-CM

## 2011-01-13 DIAGNOSIS — E785 Hyperlipidemia, unspecified: Secondary | ICD-10-CM

## 2011-01-13 DIAGNOSIS — I10 Essential (primary) hypertension: Secondary | ICD-10-CM

## 2011-01-13 NOTE — Assessment & Plan Note (Signed)
Stable

## 2011-01-13 NOTE — Assessment & Plan Note (Signed)
For cardiology evaluation July 6th for surgical clearance. He denies any chest pain or discomfort.

## 2011-01-13 NOTE — Patient Instructions (Signed)
Medically OK for surgery.  Blood in stool - there is a hemorrhoid at the 6 o'clock position on exam. No blood in the stool today. Plan - try taking a dose of a bulk laxative, e.g. Metamucil, benefiber, etc, once a day to give an easier bowel movement and prevent the episodes of frequent loose stools. Will refer you for routine colonoscopy.

## 2011-01-13 NOTE — Assessment & Plan Note (Signed)
Reviewed eChart and centricity: patient had an elevated A1C in Jan '05 - 6.9%. No A1C since that time. His serum glucose is generally just above normal range with last lab Nov '11 with serum glucose of 124.  Plan - close monitoring in the perioperative period.

## 2011-01-13 NOTE — Progress Notes (Signed)
Subjective:    Patient ID: [name], male    DOB: 12-18-1934, 76 y.o.   MRN: [id number]  HPI [name] presents for medical clearance for right TKR by Dr. Priscille Kluver. He has been feeling well except for knee pain. He has had no respiratory or cardiac symptoms. Reviewed last lab from November '11: normal metabolic panel and cholesterol.   He does report that about every two weeks he will have a day of frequent BMs, up to 5-6 per day,  that will end up being loose. He has noted some blood on the toilet paper at the end of such a bout. Of note he has note had a colonoscopy for many years and he is concerned about underlying colon problems.   Past Medical History  Diagnosis Date  . Coronary atherosclerosis of unspecified type of vessel, native or graft     s/p stent mid LAD 2005  . DJD (degenerative joint disease)   . GERD (gastroesophageal reflux disease)   . DM (diabetes mellitus)   . HTN (hypertension)   . Hyperlipidemia    Past Surgical History  Procedure Date  . Ptca     Hx of it.   . Knee surgery   . Kidney stone removal    No family history on file. History   Social History  . Marital Status: Married    Spouse Name: N/A    Number of Children: N/A  . Years of Education: N/A   Occupational History  . Not on file.   Social History Main Topics  . Smoking status: Former Games developer  . Smokeless tobacco: Not on file   Comment: quit in 1985. smoked 1ppd for 35 years   . Alcohol Use: No  . Drug Use: No  . Sexually Active: Not on file   Other Topics Concern  . Not on file   Social History Narrative   Married with children. 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 10:20 am        Review of Systems Review of Systems  Constitutional:  Negative for fever, chills, activity change and unexpected weight change.  HEENT:  Negative for hearing loss, ear pain, congestion, neck stiffness and postnasal drip. Negative for sore throat or  swallowing problems. Negative for dental complaints.   Eyes: Negative for vision loss or change in visual acuity.  Respiratory: Negative for chest tightness and wheezing.   Cardiovascular: Negative for chest pain and palpitation. No decreased exercise tolerance Gastrointestinal: Change in bowel habit per HPI. No bloating or gas. No reflux or indigestion Genitourinary: Negative for urgency, frequency, flank pain and difficulty urinating.  Musculoskeletal: Negative for myalgias, back pain and gait problem. Chronic knee pain that does affect his gait.  Neurological: Negative for dizziness, tremors, weakness and headaches.  Hematological: Negative for adenopathy.  Psychiatric/Behavioral: Negative for behavioral problems and dysphoric mood.       Objective:   Physical Exam Vital signs reviewed Gen'l: Well nourished well developed white male in no acute distress  HENT:  Head: Normocephalic and atraumatic.  Right Ear: External ear normal. EAC/TM nl Left Ear: External ear normal.  EAC/TM nl Nose: Nose normal.  Mouth/Throat: Oropharynx is clear and moist. Dentition - native, in good repair. No buccal or palatal lesions. Posterior pharynx clear. Eyes: Conjunctivae and sclera clear. EOM intact. Pupils are equal, round, and reactive to light. Right eye exhibits no discharge. Left eye exhibits no discharge. Neck: Normal range of motion. Neck supple. No  JVD present. No tracheal deviation present. No thyromegaly present.  Cardiovascular: Normal rate, regular rhythm, no gallop, no friction rub, no murmur heard.      Quiet precordium. 2+ radial and DP pulses . No carotid bruits Pulmonary/Chest: Effort normal. No respiratory distress or increased WOB, no wheezes, no rales. No chest wall deformity or CVAT. Abdominal: Soft. Bowel sounds are normal in all quadrants. He exhibits no distension, no rebound or guarding, No heptosplenomegaly. There is minimal tenderness to deep palpation RLQ  Genitourinary:   deferred Musculoskeletal: Normal range of motion. He exhibits no edema and no tenderness.       Small and large joints without redness, synovial thickening or deformity. Full range of motion preserved about all small, median and large joints.  Lymphadenopathy:    He has no cervical or supraclavicular adenopathy.  Neurological: He is alert and oriented to person, place, and time. CN II-XII intact. DTRs 2+ and symmetrical biceps, radial and patellar tendons. Cerebellar function normal with no tremor, rigidity, normal gait and station.  Skin: Skin is warm and dry. No rash noted. No erythema.  Psychiatric: He has a normal mood and affect. His behavior is normal. Thought content normal.         Assessment & Plan:  Preoperative medical clearance- the patient is medically stable and cleared for surgery. Cardiology will make a recommendation seperately. He has several issues which will be addressed at future office visits.

## 2011-01-13 NOTE — Assessment & Plan Note (Signed)
ON no medication. Last LDL 123 in Nov '11. He has a h/o CAD s/p PTCA/stent.  Plan - patient is a candidate for :"statin" medications - will address at next OV

## 2011-01-13 NOTE — Assessment & Plan Note (Signed)
BP Readings from Last 3 Encounters:  01/13/11 118/72  07/29/10 136/80  07/01/10 116/68   Good control on present medications.

## 2011-01-14 ENCOUNTER — Encounter: Payer: Self-pay | Admitting: Cardiovascular Disease

## 2011-01-14 ENCOUNTER — Ambulatory Visit (INDEPENDENT_AMBULATORY_CARE_PROVIDER_SITE_OTHER): Payer: Medicare Other | Admitting: Cardiovascular Disease

## 2011-01-14 ENCOUNTER — Encounter: Payer: Self-pay | Admitting: Gastroenterology

## 2011-01-14 VITALS — BP 117/72 | HR 74 | Resp 14 | Ht 68.0 in | Wt 193.0 lb

## 2011-01-14 DIAGNOSIS — Z01810 Encounter for preprocedural cardiovascular examination: Secondary | ICD-10-CM | POA: Insufficient documentation

## 2011-01-14 DIAGNOSIS — I2510 Atherosclerotic heart disease of native coronary artery without angina pectoris: Secondary | ICD-10-CM

## 2011-01-14 NOTE — Assessment & Plan Note (Signed)
Stable.  NO changes. 

## 2011-01-14 NOTE — Assessment & Plan Note (Signed)
He is having no active signs of angina or heart failure. Based on current ACC/AHA guidelines, no further workup prior to the planned surgical procedure. Continue current meds. OK to hold ASA for surgery if necessary. He is ok to proceed with the surgery.

## 2011-01-14 NOTE — Progress Notes (Signed)
History of Present Illness:76 yo WM with history of CAD s/p placement of drug eluting stent in mid LAD in January of 2005 with residual disease in proximal LAD and RCA, HTN, Hyperlipidemia and GERD who presents today for routine cardiac follow up.  He has been doing well and has remained active but is limited by right knee pain. He has not tolerated statins in the past secondary to myalgias. His main complaints are of back pain and knee pain.  He has plans for a total knee replacement in the next few weeks with Dr. Priscille Kluver.   He denies any chest pain, SOB, palpitations, near syncope or syncope.   Past Medical History  Diagnosis Date  . Coronary atherosclerosis of unspecified type of vessel, native or graft     s/p stent mid LAD 2005  . DJD (degenerative joint disease)   . GERD (gastroesophageal reflux disease)   . DM (diabetes mellitus)   . HTN (hypertension)   . Hyperlipidemia     Past Surgical History  Procedure Date  . Ptca     Hx of it.   . Knee surgery   . Kidney stone removal     Current Outpatient Prescriptions  Medication Sig Dispense Refill  . aspirin 325 MG tablet Take 325 mg by mouth daily.        Marland Kitchen atenolol-chlorthalidone (TENORETIC) 50-25 MG per tablet Take 1 tablet by mouth daily.        . cyclobenzaprine (FLEXERIL) 5 MG tablet TAKE 1 OR 2 TABLETS EVERY 6 HOURS FOR NECK STIFFNESS AND PAIN.  60 tablet  3  . Flaxseed, Linseed, (FLAX SEED OIL) 1000 MG CAPS Take 1 capsule by mouth 2 (two) times daily.        . isosorbide mononitrate (IMDUR) 60 MG 24 hr tablet Take 60 mg by mouth daily.        . naproxen sodium (ANAPROX) 220 MG tablet Take 220 mg by mouth as needed.        . nitroGLYCERIN (NITROSTAT) 0.4 MG SL tablet Place 0.4 mg under the tongue every 5 (five) minutes as needed.        . potassium chloride SA (K-DUR,KLOR-CON) 20 MEQ tablet Take 20 mEq by mouth daily.       . Red Yeast Rice Extract 600 MG CAPS Take 1 capsule by mouth 3 (three) times daily.        Marland Kitchen  DISCONTD: cyclobenzaprine (FLEXERIL) 5 MG tablet Take 1 tablet (5 mg total) by mouth every 8 (eight) hours as needed for muscle spasms.  60 tablet  5    Allergies  Allergen Reactions  . Statins     History   Social History  . Marital Status: Married    Spouse Name: N/A    Number of Children: N/A  . Years of Education: N/A   Occupational History  . Not on file.   Social History Main Topics  . Smoking status: Former Games developer  . Smokeless tobacco: Not on file   Comment: quit in 1985. smoked 1ppd for 35 years   . Alcohol Use: No  . Drug Use: No  . Sexually Active: Not on file   Other Topics Concern  . Not on file   Social History Narrative   Married with children. 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 10:20 am     No family history on file.  Review of Systems:  As stated in the HPI and otherwise negative.  BP 117/72  Pulse 74  Resp 14  Ht 5\' 8"  (1.727 m)  Wt 193 lb (87.544 kg)  BMI 29.35 kg/m2  Physical Examination: General: Well developed, well nourished, NAD HEENT: OP clear, mucus membranes moist SKIN: warm, dry. No rashes. Neuro: No focal deficits Musculoskeletal: Muscle strength 5/5 all ext Psychiatric: Mood and affect normal Neck: No JVD, no carotid bruits, no thyromegaly, no lymphadenopathy. Lungs:Clear bilaterally, no wheezes, rhonci, crackles Cardiovascular: Regular rate and rhythm. No murmurs, gallops or rubs. Abdomen:Soft. Bowel sounds present. Non-tender.  Extremities: No lower extremity edema. Pulses are 2 + in the bilateral DP/PT.  EKG: NSR, rate LAFB.

## 2011-02-02 ENCOUNTER — Ambulatory Visit (AMBULATORY_SURGERY_CENTER): Payer: Medicare Other | Admitting: *Deleted

## 2011-02-02 VITALS — Ht 68.0 in | Wt 193.5 lb

## 2011-02-02 DIAGNOSIS — Z1211 Encounter for screening for malignant neoplasm of colon: Secondary | ICD-10-CM

## 2011-02-02 MED ORDER — MOVIPREP 100 G PO SOLR: ORAL | Status: DC

## 2011-02-09 HISTORY — PX: TOTAL KNEE ARTHROPLASTY: SHX125

## 2011-02-16 ENCOUNTER — Ambulatory Visit (AMBULATORY_SURGERY_CENTER): Payer: Medicare Other | Admitting: Gastroenterology

## 2011-02-16 ENCOUNTER — Encounter: Payer: Self-pay | Admitting: Gastroenterology

## 2011-02-16 VITALS — BP 139/84 | HR 78 | Temp 98.2°F | Resp 12 | Ht 68.0 in | Wt 187.0 lb

## 2011-02-16 DIAGNOSIS — K5730 Diverticulosis of large intestine without perforation or abscess without bleeding: Secondary | ICD-10-CM

## 2011-02-16 DIAGNOSIS — Z1211 Encounter for screening for malignant neoplasm of colon: Secondary | ICD-10-CM

## 2011-02-16 DIAGNOSIS — K648 Other hemorrhoids: Secondary | ICD-10-CM

## 2011-02-16 MED ORDER — SODIUM CHLORIDE 0.9 % IV SOLN: 500.0000 mL | INTRAVENOUS | Status: DC

## 2011-02-16 NOTE — Patient Instructions (Signed)
Discharge instructions given with verbal understanding. Handouts on diverticulosis and hemorrhoids given. Resume previous medications. 

## 2011-02-17 ENCOUNTER — Telehealth: Payer: Self-pay | Admitting: *Deleted

## 2011-02-17 NOTE — Telephone Encounter (Signed)

## 2011-02-23 ENCOUNTER — Ambulatory Visit (HOSPITAL_COMMUNITY)
Admission: RE | Admit: 2011-02-23 | Discharge: 2011-02-23 | Disposition: A | Discharge status: Home / Self Care | Payer: Medicare Other | Source: Ambulatory Visit | Attending: Orthopedic Surgery | Admitting: Orthopedic Surgery

## 2011-02-23 ENCOUNTER — Other Ambulatory Visit: Payer: Self-pay | Admitting: Orthopedic Surgery

## 2011-02-23 ENCOUNTER — Encounter (HOSPITAL_COMMUNITY): Payer: Medicare Other

## 2011-02-23 ENCOUNTER — Other Ambulatory Visit (HOSPITAL_COMMUNITY): Payer: Self-pay | Admitting: Orthopedic Surgery

## 2011-02-23 DIAGNOSIS — Z01818 Encounter for other preprocedural examination: Secondary | ICD-10-CM

## 2011-02-23 DIAGNOSIS — Z01811 Encounter for preprocedural respiratory examination: Secondary | ICD-10-CM | POA: Insufficient documentation

## 2011-02-23 DIAGNOSIS — M1710 Unilateral primary osteoarthritis, unspecified knee: Secondary | ICD-10-CM | POA: Insufficient documentation

## 2011-02-23 DIAGNOSIS — K449 Diaphragmatic hernia without obstruction or gangrene: Secondary | ICD-10-CM | POA: Insufficient documentation

## 2011-02-23 DIAGNOSIS — Z01812 Encounter for preprocedural laboratory examination: Secondary | ICD-10-CM | POA: Insufficient documentation

## 2011-02-23 DIAGNOSIS — I10 Essential (primary) hypertension: Secondary | ICD-10-CM | POA: Insufficient documentation

## 2011-02-23 LAB — DIFFERENTIAL
Basophils Absolute: 0.1 10*3/uL (ref 0.0–0.1)
Basophils Relative: 1 % (ref 0–1)
Eosinophils Absolute: 0.4 10*3/uL (ref 0.0–0.7)
Eosinophils Relative: 6 % — ABNORMAL HIGH (ref 0–5)
Lymphocytes Relative: 34 % (ref 12–46)
Monocytes Absolute: 0.7 10*3/uL (ref 0.1–1.0)
Monocytes Relative: 9 % (ref 3–12)
Neutro Abs: 3.9 10*3/uL (ref 1.7–7.7)

## 2011-02-23 LAB — COMPREHENSIVE METABOLIC PANEL
AST: 29 U/L (ref 0–37)
BUN: 18 mg/dL (ref 6–23)
CO2: 27 mEq/L (ref 19–32)
Calcium: 9.5 mg/dL (ref 8.4–10.5)
Chloride: 106 mEq/L (ref 96–112)
Creatinine, Ser: 0.86 mg/dL (ref 0.50–1.35)
GFR calc Af Amer: >60 mL/min (ref >60)
GFR calc non Af Amer: >60 mL/min (ref >60)
Glucose, Bld: 113 mg/dL — ABNORMAL HIGH (ref 70–99)
Total Bilirubin: 0.5 mg/dL (ref 0.3–1.2)

## 2011-02-23 LAB — URINALYSIS, ROUTINE W REFLEX MICROSCOPIC
Bilirubin Urine: NEGATIVE
Glucose, UA: NEGATIVE mg/dL
Ketones, ur: NEGATIVE mg/dL
Leukocytes, UA: NEGATIVE
Nitrite: NEGATIVE
Protein, ur: NEGATIVE mg/dL

## 2011-02-23 LAB — SURGICAL PCR SCREEN: Staphylococcus aureus: POSITIVE — AB

## 2011-02-23 LAB — CBC
HCT: 40.9 % (ref 39.0–52.0)
Hemoglobin: 14.1 g/dL (ref 13.0–17.0)
MCH: 28.9 pg (ref 26.0–34.0)
MCHC: 34.5 g/dL (ref 30.0–36.0)
MCV: 83.8 fL (ref 78.0–100.0)
RDW: 13.5 % (ref 11.5–15.5)

## 2011-03-03 ENCOUNTER — Inpatient Hospital Stay (HOSPITAL_COMMUNITY)
Admission: RE | Admit: 2011-03-03 | Discharge: 2011-03-05 | DRG: 470 | Disposition: A | Discharge status: Home Health | Payer: Medicare Other | Source: Ambulatory Visit | Attending: Orthopedic Surgery | Admitting: Orthopedic Surgery

## 2011-03-03 DIAGNOSIS — Z7982 Long term (current) use of aspirin: Secondary | ICD-10-CM

## 2011-03-03 DIAGNOSIS — Z9861 Coronary angioplasty status: Secondary | ICD-10-CM

## 2011-03-03 DIAGNOSIS — M21169 Varus deformity, not elsewhere classified, unspecified knee: Secondary | ICD-10-CM | POA: Diagnosis present

## 2011-03-03 DIAGNOSIS — Z01812 Encounter for preprocedural laboratory examination: Secondary | ICD-10-CM

## 2011-03-03 DIAGNOSIS — M1710 Unilateral primary osteoarthritis, unspecified knee: Principal | ICD-10-CM | POA: Diagnosis present

## 2011-03-03 DIAGNOSIS — I2510 Atherosclerotic heart disease of native coronary artery without angina pectoris: Secondary | ICD-10-CM | POA: Diagnosis present

## 2011-03-03 DIAGNOSIS — I10 Essential (primary) hypertension: Secondary | ICD-10-CM | POA: Diagnosis present

## 2011-03-03 DIAGNOSIS — K219 Gastro-esophageal reflux disease without esophagitis: Secondary | ICD-10-CM | POA: Diagnosis present

## 2011-03-03 DIAGNOSIS — D62 Acute posthemorrhagic anemia: Secondary | ICD-10-CM | POA: Diagnosis not present

## 2011-03-03 DIAGNOSIS — Z791 Long term (current) use of non-steroidal anti-inflammatories (NSAID): Secondary | ICD-10-CM

## 2011-03-03 LAB — GLUCOSE, CAPILLARY: Glucose-Capillary: 114 mg/dL — ABNORMAL HIGH (ref 70–99)

## 2011-03-03 LAB — TYPE AND SCREEN

## 2011-03-04 LAB — CBC
HCT: 30.6 % — ABNORMAL LOW (ref 39.0–52.0)
MCH: 29.1 pg (ref 26.0–34.0)
MCV: 84.8 fL (ref 78.0–100.0)
Platelets: 188 10*3/uL (ref 150–400)
RBC: 3.61 MIL/uL — ABNORMAL LOW (ref 4.22–5.81)

## 2011-03-04 LAB — BASIC METABOLIC PANEL
BUN: 17 mg/dL (ref 6–23)
CO2: 28 mEq/L (ref 19–32)
Calcium: 8.2 mg/dL — ABNORMAL LOW (ref 8.4–10.5)
Chloride: 105 mEq/L (ref 96–112)
Creatinine, Ser: 0.7 mg/dL (ref 0.50–1.35)

## 2011-03-05 LAB — CBC
HCT: 26.7 % — ABNORMAL LOW (ref 39.0–52.0)
MCH: 28.8 pg (ref 26.0–34.0)
MCHC: 33.7 g/dL (ref 30.0–36.0)
MCV: 85.3 fL (ref 78.0–100.0)
Platelets: 189 10*3/uL (ref 150–400)
RDW: 13.6 % (ref 11.5–15.5)
WBC: 8.8 10*3/uL (ref 4.0–10.5)

## 2011-03-05 LAB — BASIC METABOLIC PANEL
BUN: 18 mg/dL (ref 6–23)
Calcium: 8.2 mg/dL — ABNORMAL LOW (ref 8.4–10.5)
Chloride: 104 mEq/L (ref 96–112)
Creatinine, Ser: 0.78 mg/dL (ref 0.50–1.35)
GFR calc Af Amer: >60 mL/min (ref >60)
GFR calc non Af Amer: >60 mL/min (ref >60)

## 2011-03-11 NOTE — Op Note (Signed)
  NAMEJASHAD, [name] NO.:  [id number]  MEDICAL RECORD NO.:  [id number]  LOCATION:  1604                         FACILITY:  [facility]  PHYSICIAN:  John L. Rendall, M.D.  DATE OF BIRTH:  1934-11-21  DATE OF PROCEDURE:  03/03/2011 DATE OF DISCHARGE:                              OPERATIVE REPORT   PREOPERATIVE DIAGNOSIS:  Osteoarthritis, right knee.  SURGICAL PROCEDURE:  Right LCS total knee arthroplasty.  POSTOPERATIVE DIAGNOSIS:  Osteoarthritis, right knee.  SURGEON:  John L. Rendall, M.D.  ASSISTANT:  Legrand Pitts. Duffy, P.A. - present and participating in the entire procedure.  ANESTHESIA:  Spinal with femoral nerve block.  PATHOLOGY:  Patient has a varus knee with bone on bone medial compartment plus arthritic change in the other 2 compartments.  He has had progressive pain in the knee and no symptomatic relief with any of the conservative measures.  PROCEDURE:  Under spinal anesthesia, the right leg was prepared with DuraPrep and draped as a sterile field.  Sterile tourniquet was applied to the proximal leg and a midline incision was made.  The patella was everted.  The joint was found to have completely eburnated bone in the medial compartment.  The knee was debrided in preparation for osteotomies.  Then using the tibial guide, the proximal tibial osteotomy was done.  An excellent perpendicular cut to the long axis was obtained and after release of the medial side, attention was turned to the first femoral guides.  The anterior and posterior flare of the distal femur were resected with a 10 mm flexion gap that was balanced.  Then the distal femoral cut was made with a 10 mm extension gap, which was balanced.  Care was taken to remove the remnants of menisci and the cruciates and spurs of the back of the femoral condyles.  The recessing guide was then used.  Following this, the tibia was found to be a size 4 center peg hole.  Carolin Guernsey was inserted.  A 10  bearing was inserted.  The large femoral component was inserted.  It had an excellent fit anteriorly and distally with just a slight flare over the edge of the femur in the mid flexed position, but this was well balanced and symmetrical.  This was felt to be all acceptable.  The knee was stable in extension, stable in flexion, and had full extension and flexion to 120 degrees.  At this point, permanent components were obtained.  Pulse irrigation was used.  All components were cemented in place.  Once the cement was hardened, then the excess removed.  Tourniquet was let down at about 52 minutes.  Multiple small vessels were cauterized.  The knee was closed in layers with #1 Tycron, #1 Vicryl, 2-0 Vicryl, and skin clips.  Patient tolerated the procedure well and returned to recovery in good condition.     John L. Priscille Kluver, M.D.     Renato Gails  D:  03/03/2011  T:  03/04/2011  Job:  [id number]  Electronically Signed by Erasmo Leventhal M.D. on 03/11/2011 07:27:19 AM

## 2011-04-13 NOTE — Discharge Summary (Signed)
[name], [name] NO.:  [id number]  MEDICAL RECORD NO.:  [id number]  LOCATION:  1604                         FACILITY:  [facility]  PHYSICIAN:  Autum Benfer L. Rendall, M.D.  DATE OF BIRTH:  [birth year]  DATE OF ADMISSION:  03/03/2011 DATE OF DISCHARGE:  03/05/2011                              DISCHARGE SUMMARY   DIAGNOSIS:  End-stage degenerative joint disease, right knee.  PROCEDURE WHILE IN HOSPITAL:  Right total knee arthroplasty.  DISCHARGE SUMMARY:  HISTORY OF PRESENT ILLNESS:  The patient is a 76 year old male, who has a greater than 40-year history of pain in his right knee.  It has been progressive and has worsened recently.  He had an open meniscectomy of the right knee in 1966, and knee arthroscopy x2 in the following years, showing significant arthritic changes throughout.  X-rays of the knee show total collapse of the medial joint with bone on bone contact.  The pain is now to the point that the patient cannot bear weight.  He is having pain on a consistent basis, on a daily basis.  He now walked greater than 2 blocks without having to rest.  He has radiating pain up and down the leg, and he is walking with enough limp to actually aggravate his back.  He has failed conservative care including anti- inflammatory nonsteroidal as well as steroidal injections, and use of canes, none of which have given him significant relief at this point. Pain now prevents activities of daily living, interferes with sleep and make safe ambulation difficult, particularly he cannot rise from the seated position without some assistance such as a cane.  Because of his continued pain, he has discussed the option of having a total knee arthroplasty with Dr. Priscille Kluver and has agreed to the surgery after discussing the risks versus the benefits of the procedure.  PAST MEDICAL HISTORY:  The patient's medical history positive for: 1. Hypertension. 2. Coronary artery disease with  stent. 3. Hyperlipidemia. 4. Borderline diabetes, improved with diet. 5. Gastroesophageal reflux disease.  ALLERGIES:  He is allergic to IMODIUM A-D, which causes a rash.  SURGICAL HISTORY:  Significant for colonoscopy, stent placement in 2004, open meniscectomy 1966, knee arthroplasties on both sides x2, kidney stone open removal, and cataracts in 1998.  No postoperative complications reported.  MEDICATIONS AT THE TIME OF ADMISSION: 1. Tylenol Extra Strength 500 mg 1 to 2 every 6 hours as needed. 2. Nitroglycerin SL 0.4 mg 1 tablet every 5 minutes 3 doses as needed     for chest pain. 3. Naprosyn 220 mg p.o. 1 tablet every 8 hours as needed, discontinued     5 days prior to the procedure. 4. Omeprazole 20 mg one p.o. q.a.m. 5. Atenolol and chlorthalidone 50/25 one p.o. q.a.m. 6. Cyclobenzaprine 5 mg 1 to 2 tablets every 6 hours as needed for     muscle spasm. 7. Flaxseed oil 1000 mg one p.o. b.i.d. 8. Red yeast 600 mg three tablets daily. 9. Klor-Con 20 mEq 1 tablet twice daily. 10.Enteric-coated aspirin 325 mg one p.o. daily. 11.Isosorbide mononitrate XR 60 mg one q.a.m.  SOCIAL HISTORY:  The patient has previously smoked one pack per  day for 25 to 30 years, but last smoked in 1980.  He does not use alcohol.  He lives with his wife in a 3-storey residence.  FAMILY HISTORY:  Positive for heart disease, heart attack, hypertension, and cancer.  REVIEW OF SYSTEMS:  Further review of his systems include positive cataracts, changes in vision, reading glasses, decreased hearing but no hearing aids, occasional ringing in the ears, partial lower and full upper dentures, heart disease with stent, hypertension, hemorrhoids, whooping cough as a child, kidney stones, and frequent night urination.  PREOPERATIVE HISTORY AND PHYSICAL:  PHYSICAL EXAMINATION:  VITAL SIGNS:  Height 5 feet 8 inches, weight 187 pounds.  Temperature 97.6, pulse 76, respirations 20, blood  pressure 138/72. GENERAL:  He is a well-developed, well-nourished male.  No acute distress.  He does have a positive limp to his gait. HEENT:  Shows him to be normocephalic and atraumatic.  Pupils equal, round, reactive to light and accommodation.  Pharynx clear.  Nose and ears showed no abnormality. NECK:  Full range of motion.  No masses.  Carotids 2+.  No bruits. CHEST AND LUNGS:  Clear to auscultation bilaterally. CARDIAC:  Regular rate and rhythm.  No murmurs, rubs, or gallops. ABDOMEN:  Soft, nontender.  No bout.  No masses.  Bowel sounds are 2+. CNS:  Within normal limits, having cranial nerves II-XII grossly intact. DTRs 2+ bilaterally, and alert and oriented x3. SKIN:  Well-developed without any abnormality. MUSCULOSKELETAL:  Shows left knee range of motion 0 to 104 degrees. Positive crepitus, moderate.  No pain.  No effusion, right knee.  0 to 120 degrees with large amount of crepitus, pain laterally.  No effusion. A well-healed incision from previous open meniscectomy.  PREOPERATIVE LABORATORY DATA:  Including CBC, CMET, chest x-ray, EKG, PT and PTT were all within acceptable limits.  On the date of his admission, the patient was taken to the operating room in [facility] where he underwent a right LCS total knee arthroplasty, size 4 tibia, MBT keel, a large patella, LCS, and a large right femur.  All components cemented.  A 10-mm tibial platform inserted.  He was placed on perioperative antibiotics.  He was placed on postoperative prophylaxis with Xarelto as well as mechanical means of decreasing DVT.  Physical therapy was begun in the PACU using CPM machine and physical therapy was begun in the room, day of surgery.  The patient was to have both the Foley drain and Hemovac drain placed perioperatively.  He was placed on a PCA for pain control. Postoperative day #1, the patient was tolerating diet well.  Pain 4/5. He was afebrile.  Vital signs were stable.   Hemoglobin 10.5, WBC 9.3. He is alert and oriented.  Wound incision was clean and dry.  Tolerating CPM 0 to 7 without difficulty.  Neurovascularly intact.  Otherwise stable and PCA and Hemovac were discontinued.  Second postoperative day, the patient's pain was improving with oral pain medication.  He was tolerating diet, voiding without difficulty,  had positive flatus, although he did not actually had a bowel movement at that point. Temperature 98.7.  Vital signs were stable.  Hemoglobin had dropped to 9.0, but there are no signs or symptoms of this post acute blood loss anemia.  He was alert and oriented x3.  His wound was clean and dry on dressing change.  He was otherwise neurovascularly intact.  He was judged to be medically stable and orthopedically improved and was discharged home to the care of his  family.  At the time of his discharge, the patient's medications: 1. Celebrex one p.o. b.i.d. 2. Colace 100 mg one p.o. b.i.d. 3. Percocet 5/325 one to two every 4 hours as needed. 4. Xarelto 10 mg one p.o. q.24 h. 5. Senokot 1 tablet by mouth twice daily before meals. 6. Atenolol 50/25 one tablet every morning. 7. Cyclobenzaprine 5 mg one to two every 6 hours as needed for spasm. 8. Flaxseed oil 1 tablet twice daily. 9. Isosorbide mononitrate XR 60 mg by mouth every morning. 10.Klor-Con 20 mEq 1 tablet by mouth twice daily. 11.Nitroglycerin sublingual 0.4 mg as needed for chest pain. 12.Omeprazole 20 mg one p.o. by mouth every morning.  ACTIVITY:  Weightbearing as tolerated, using a walker, and dressing changes daily or as necessary to keep wound clean, dry and covered.  The patient may shower on postoperative day #5.  The patient should return to see Dr. Priscille Kluver for a followup check on Tuesday, March 15, 2011, calling [phone] for appointment.  He will be followed for home health by Genevieve Norlander, and he has their contact information.  He should return to see Dr. Priscille Kluver sooner  should have any increase in pain that is not well controlled by pain medication and temperatures greater than 101 or any drainage from wound that looks like pus.  He will be on regular diet whatever his doctor has prescribed for his prediabetic condition.  DIAGNOSES: 1. End-stage arthritis, right knee. 2. History of hypertension. 3. History of coronary artery disease with stent placement. 4. Hyperlipidemia. 5. Borderline diabetes. 6. Gastroesophageal reflux disease. 7. As well as acute blood loss anemia, status post total knee,     asymptomatic.     Laural Benes. Jannet Mantis.   ______________________________ Carlisle Beers Priscille Kluver, M.D.    Merita Norton  D:  03/30/2011  T:  03/30/2011  Job:  [id number]  Electronically Signed by Dan Humphreys P.A. on 03/31/2011 11:03:22 AM Electronically Signed by Erasmo Leventhal M.D. on 04/13/2011 02:03:34 PM

## 2011-06-27 ENCOUNTER — Other Ambulatory Visit: Payer: Self-pay | Admitting: Internal Medicine

## 2011-06-29 ENCOUNTER — Other Ambulatory Visit: Payer: Self-pay | Admitting: *Deleted

## 2011-06-29 ENCOUNTER — Other Ambulatory Visit: Payer: Self-pay | Admitting: Internal Medicine

## 2011-06-29 MED ORDER — POTASSIUM CHLORIDE CRYS ER 20 MEQ PO TBCR: 20.0000 meq | EXTENDED_RELEASE_TABLET | Freq: Two times a day (BID) | ORAL | Status: DC

## 2011-06-30 ENCOUNTER — Other Ambulatory Visit: Payer: Self-pay

## 2011-06-30 MED ORDER — ISOSORBIDE MONONITRATE ER 60 MG PO TB24: 60.0000 mg | ORAL_TABLET | Freq: Every day | ORAL | Status: DC

## 2011-07-20 ENCOUNTER — Ambulatory Visit (INDEPENDENT_AMBULATORY_CARE_PROVIDER_SITE_OTHER): Payer: Medicare Other | Admitting: Cardiovascular Disease

## 2011-07-20 ENCOUNTER — Encounter: Payer: Self-pay | Admitting: Cardiovascular Disease

## 2011-07-20 ENCOUNTER — Encounter: Payer: Self-pay | Admitting: *Deleted

## 2011-07-20 ENCOUNTER — Encounter (HOSPITAL_COMMUNITY): Payer: Self-pay | Admitting: Pharmacy Technician

## 2011-07-20 DIAGNOSIS — R079 Chest pain, unspecified: Secondary | ICD-10-CM

## 2011-07-20 DIAGNOSIS — I2510 Atherosclerotic heart disease of native coronary artery without angina pectoris: Secondary | ICD-10-CM

## 2011-07-20 LAB — CBC WITH DIFFERENTIAL/PLATELET
Basophils Absolute: 0 10*3/uL (ref 0.0–0.1)
Basophils Relative: 0.5 % (ref 0.0–3.0)
Eosinophils Absolute: 0.3 10*3/uL (ref 0.0–0.7)
Hemoglobin: 13.6 g/dL (ref 13.0–17.0)
Lymphocytes Relative: 31.9 % (ref 12.0–46.0)
MCHC: 34.1 g/dL (ref 30.0–36.0)
Monocytes Relative: 10.9 % (ref 3.0–12.0)
Neutro Abs: 3.6 10*3/uL (ref 1.4–7.7)
Neutrophils Relative %: 51.8 % (ref 43.0–77.0)
RBC: 4.76 Mil/uL (ref 4.22–5.81)
WBC: 7 10*3/uL (ref 4.5–10.5)

## 2011-07-20 LAB — PROTIME-INR: INR: 1 ratio (ref 0.8–1.0)

## 2011-07-20 LAB — BASIC METABOLIC PANEL
Calcium: 9.2 mg/dL (ref 8.4–10.5)
Creatinine, Ser: 0.8 mg/dL (ref 0.4–1.5)
GFR: 101.75 mL/min (ref >60.00)
Sodium: 142 mEq/L (ref 135–145)

## 2011-07-20 MED ORDER — POTASSIUM CHLORIDE CRYS ER 20 MEQ PO TBCR: 20.0000 meq | EXTENDED_RELEASE_TABLET | Freq: Two times a day (BID) | ORAL | Status: DC

## 2011-07-20 MED ORDER — ATENOLOL-CHLORTHALIDONE 50-25 MG PO TABS: 1.0000 | ORAL_TABLET | Freq: Every day | ORAL | Status: DC

## 2011-07-20 MED ORDER — ISOSORBIDE MONONITRATE ER 60 MG PO TB24: 60.0000 mg | ORAL_TABLET | Freq: Every day | ORAL | Status: DC

## 2011-07-20 NOTE — Progress Notes (Signed)
Addended by: Dossie Arbour on: 07/20/2011 12:11 PM   Modules accepted: Orders

## 2011-07-20 NOTE — Patient Instructions (Signed)
Your physician recommends that you schedule a follow-up appointment in: 3 weeks.   Your physician has requested that you have a cardiac catheterization. Cardiac catheterization is used to diagnose and/or treat various heart conditions. Doctors may recommend this procedure for a number of different reasons. The most common reason is to evaluate chest pain. Chest pain can be a symptom of coronary artery disease (CAD), and cardiac catheterization can show whether plaque is narrowing or blocking your heart's arteries. This procedure is also used to evaluate the valves, as well as measure the blood flow and oxygen levels in different parts of your heart. For further information please visit [email or web address] Please follow instruction sheet, as given. Scheduled for July 27, 2011

## 2011-07-20 NOTE — Assessment & Plan Note (Signed)
Known CAD with stent mid LAD 2005. Cath December 2005 with 70% proximal LAD stenosis and 50% mid RCA stenosis. Now with exertional chest pain and dyspnea, change last two months. Concerning for angina. Will arrange left heart cath with possible PCI on Wednesday 07/27/11 in main cath lab at [facility]. Risks and benefits reviewed with pt. He agrees to proceed. Labs today.

## 2011-07-20 NOTE — Progress Notes (Signed)
History of Present Illness: 77 yo WM with history of CAD s/p placement of drug eluting stent in mid LAD in January of 2005 with residual disease in proximal LAD and RCA, HTN, Hyperlipidemia and GERD who presents today for routine cardiac follow up. He has been doing well and has remained active but is limited by right knee pain. He has not tolerated statins in the past secondary to myalgias. His main complaint is his back pain. I saw him last in July 2012. He had a right total knee replacement per Dr. Priscille Kluver in August 2012.   He tells me today that he has been having chest pains on a daily basis. This is substernal without radiation, occurs with exertion. No diaphoresis. This has worsened over the last few months. He has no rest pain. 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   Past Medical History  Diagnosis Date  . Coronary atherosclerosis of unspecified type of vessel, native or graft     s/p stent mid LAD 2005  . DJD (degenerative joint disease)   . GERD (gastroesophageal reflux disease)   . DM (diabetes mellitus)   . HTN (hypertension)   . Hyperlipidemia     Past Surgical History  Procedure Date  . Ptca     Hx of it.   . Knee surgery   . Kidney stone removal   . Sigmoidoscopy     Current Outpatient Prescriptions  Medication Sig Dispense Refill  . aspirin 325 MG tablet Take 325 mg by mouth daily.        Marland Kitchen atenolol-chlorthalidone (TENORETIC) 50-25 MG per tablet TAKE 1 TABLET ONCE DAILY  90 tablet  3  . Flaxseed, Linseed, (FLAX SEED OIL) 1000 MG CAPS Take 1 capsule by mouth 2 (two) times daily.        . isosorbide mononitrate (IMDUR) 60 MG 24 hr tablet Take 1 tablet (60 mg total) by mouth daily.  30 tablet  8  . naproxen sodium (ANAPROX) 220 MG tablet Take 220 mg by mouth as needed.        . nitroGLYCERIN (NITROSTAT) 0.4 MG SL tablet Place 0.4 mg under the tongue every 5 (five) minutes as needed.        . potassium chloride SA (K-DUR,KLOR-CON) 20 MEQ  tablet Take 1 tablet (20 mEq total) by mouth 2 (two) times daily.  180 tablet  3  . Red Yeast Rice Extract 600 MG CAPS Take 1 capsule by mouth 3 (three) times daily.          Allergies  Allergen Reactions  . Statins     History   Social History  . Marital Status: Married    Spouse Name: N/A    Number of Children: N/A  . Years of Education: N/A   Occupational History  . Not on file.   Social History Main Topics  . Smoking status: Former Games developer  . Smokeless tobacco: Never Used   Comment: quit in 1985. smoked 1ppd for 35 years   . Alcohol Use: No  . Drug Use: No  . Sexually Active: Not on file   Other Topics Concern  . Not on file   Social History Narrative   Married with children. 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 10:20 am     No family history on file.  Review of Systems:  As stated in the HPI and otherwise negative.   BP 122/60  Pulse 60  Ht 5\' 8"  (1.727 m)  Wt 188 lb 12.8 oz (85.639 kg)  BMI 28.71 kg/m2  Physical Examination: General: Well developed, well nourished, NAD HEENT: OP clear, mucus membranes moist SKIN: warm, dry. No rashes. Neuro: No focal deficits Musculoskeletal: Muscle strength 5/5 all ext Psychiatric: Mood and affect normal Neck: No JVD, no carotid bruits, no thyromegaly, no lymphadenopathy. Lungs:Clear bilaterally, no wheezes, rhonci, crackles Cardiovascular: Regular rate and rhythm. No murmurs, gallops or rubs. Abdomen:Soft. Bowel sounds present. Non-tender.  Extremities: No lower extremity edema. Pulses are 2 + in the bilateral DP/PT.

## 2011-07-27 ENCOUNTER — Ambulatory Visit (HOSPITAL_COMMUNITY)
Admission: RE | Admit: 2011-07-27 | Discharge: 2011-07-27 | Disposition: A | Discharge status: Home / Self Care | Payer: Medicare Other | Source: Ambulatory Visit | Attending: Cardiovascular Disease | Admitting: Cardiovascular Disease

## 2011-07-27 ENCOUNTER — Other Ambulatory Visit: Payer: Self-pay

## 2011-07-27 ENCOUNTER — Encounter (HOSPITAL_COMMUNITY)
Admission: RE | Disposition: A | Discharge status: Home / Self Care | Payer: Self-pay | Source: Ambulatory Visit | Attending: Cardiovascular Disease

## 2011-07-27 DIAGNOSIS — Z9861 Coronary angioplasty status: Secondary | ICD-10-CM | POA: Insufficient documentation

## 2011-07-27 DIAGNOSIS — I2510 Atherosclerotic heart disease of native coronary artery without angina pectoris: Secondary | ICD-10-CM

## 2011-07-27 HISTORY — PX: LEFT HEART CATHETERIZATION WITH CORONARY ANGIOGRAM: SHX5451

## 2011-07-27 SURGERY — LEFT HEART CATHETERIZATION WITH CORONARY ANGIOGRAM
Anesthesia: LOCAL

## 2011-07-27 MED ORDER — ASPIRIN 81 MG PO CHEW
324.0000 mg | CHEWABLE_TABLET | ORAL | Status: AC
  Administered 2011-07-27: 324 mg via ORAL
  Filled 2011-07-27: qty 4

## 2011-07-27 MED ORDER — SODIUM CHLORIDE 0.9 % IV SOLN: INTRAVENOUS | Status: DC

## 2011-07-27 MED ORDER — ONDANSETRON HCL 4 MG/2ML IJ SOLN: 4.0000 mg | Freq: Four times a day (QID) | INTRAMUSCULAR | Status: DC | PRN

## 2011-07-27 MED ORDER — LIDOCAINE HCL (PF) 1 % IJ SOLN
INTRAMUSCULAR | Status: AC
  Filled 2011-07-27: qty 30

## 2011-07-27 MED ORDER — SODIUM CHLORIDE 0.9 % IV SOLN
INTRAVENOUS | Status: DC
  Administered 2011-07-27: 09:00:00 via INTRAVENOUS

## 2011-07-27 MED ORDER — NITROGLYCERIN 0.2 MG/ML ON CALL CATH LAB
INTRAVENOUS | Status: AC
  Filled 2011-07-27: qty 1

## 2011-07-27 MED ORDER — HEPARIN (PORCINE) IN NACL 2-0.9 UNIT/ML-% IJ SOLN
INTRAMUSCULAR | Status: AC
  Filled 2011-07-27: qty 2000

## 2011-07-27 MED ORDER — SODIUM CHLORIDE 0.9 % IJ SOLN: 3.0000 mL | Freq: Two times a day (BID) | INTRAMUSCULAR | Status: DC

## 2011-07-27 MED ORDER — SODIUM CHLORIDE 0.9 % IV SOLN: 250.0000 mL | INTRAVENOUS | Status: DC | PRN

## 2011-07-27 MED ORDER — FENTANYL CITRATE 0.05 MG/ML IJ SOLN
INTRAMUSCULAR | Status: AC
Start: 2011-07-27 — End: 2011-07-27
  Filled 2011-07-27: qty 2

## 2011-07-27 MED ORDER — MIDAZOLAM HCL 2 MG/2ML IJ SOLN
INTRAMUSCULAR | Status: AC
  Filled 2011-07-27: qty 2

## 2011-07-27 MED ORDER — SODIUM CHLORIDE 0.9 % IJ SOLN: 3.0000 mL | INTRAMUSCULAR | Status: DC | PRN

## 2011-07-27 MED ORDER — ACETAMINOPHEN 325 MG PO TABS: 650.0000 mg | ORAL_TABLET | ORAL | Status: DC | PRN

## 2011-07-27 MED ORDER — VERAPAMIL HCL 2.5 MG/ML IV SOLN
INTRAVENOUS | Status: AC
  Filled 2011-07-27: qty 2

## 2011-07-27 MED ORDER — DIAZEPAM 5 MG PO TABS
5.0000 mg | ORAL_TABLET | ORAL | Status: AC
  Administered 2011-07-27: 5 mg via ORAL
  Filled 2011-07-27: qty 1

## 2011-07-27 NOTE — Op Note (Signed)
Cardiac Catheterization Operative Report  [name] [id number] 1/16/201310:47 AM Illene Regulus, MD, MD  Procedure Performed:  1. Left Heart Catheterization 2. Selective Coronary Angiography 3. Left ventricular angiogram  Operator: Verne Carrow, MD  Arterial access site:  Right radial artery.   Indication:  Known CAD, chest pain.                                      Procedure Details: The risks, benefits, complications, treatment options, and expected outcomes were discussed with the patient. The patient and/or family concurred with the proposed plan, giving informed consent. The patient was brought to the cath lab after IV hydration was begun and oral premedication was given. The patient was further sedated with Versed and Fentanyl. The right wrist was assessed with an Allens test which was positive. The right wrist was prepped and draped in a sterile fashion. 1% lidocaine was used for local anesthesia. Using the modified Seldinger access technique, a 5 French sheath was placed in the right radial artery. 3 mg Verapamil was given through the sheath. 4000 units IV heparin was given. Standard diagnostic catheters were used to perform selective coronary angiography. A pigtail catheter was used to perform a left ventricular angiogram. The sheath was removed from the right radial artery and a Terumo hemostasis band was applied at the arteriotomy site on the right wrist.    There were no immediate complications. The patient was taken to the recovery area in stable condition.   Hemodynamic Findings: Central aortic pressure: 97/52 Left ventricular pressure: 102/3/16  Angiographic Findings:  Left main: Distal 10% stenosis.   Left Anterior Descending Artery: Proximal with diffuse 20% stenosis. 40% mid stenosis prior to mid stent. The stent has moderate, 40% restenosis that does not appear to be flow limiting. The distal vessel has several 60% stenoses which do not appear to be flow  limiting. The first diagonal is small in caliber and has ostial 50% stenosis. The second diagonal is jailed by the stent and has an ostial   Circumflex Artery: Large caliber vessel. 40% mid stenosis.   Right Coronary Artery: 40% mid stenosis. 30% distal stenosis. 50% PDA stenosis.   Left Ventricular Angiogram: LVEF=55%.   Impression: 1. Moderate CAD 2. Patent stent mid LAD with moderate restenosis 3. Normal LV systolic  function  Recommendations: Continue medical management.        Complications:  None. The patient tolerated the procedure well.

## 2011-07-27 NOTE — H&P (View-Only) (Signed)
 History of Present Illness: 76 yo WM with history of CAD s/p placement of drug eluting stent in mid LAD in January of 2005 with residual disease in proximal LAD and RCA, HTN, Hyperlipidemia and GERD who presents today for routine cardiac follow up. He has been doing well and has remained active but is limited by right knee pain. He has not tolerated statins in the past secondary to myalgias. His main complaint is his back pain. I saw him last in July 2012. He had a right total knee replacement per Dr. Rendall in August 2012.   He tells me today that he has been having chest pains on a daily basis. This is substernal without radiation, occurs with exertion. No diaphoresis. This has worsened over the last few months. He has no rest pain. 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   Past Medical History  Diagnosis Date  . Coronary atherosclerosis of unspecified type of vessel, native or graft     s/p stent mid LAD 2005  . DJD (degenerative joint disease)   . GERD (gastroesophageal reflux disease)   . DM (diabetes mellitus)   . HTN (hypertension)   . Hyperlipidemia     Past Surgical History  Procedure Date  . Ptca     Hx of it.   . Knee surgery   . Kidney stone removal   . Sigmoidoscopy     Current Outpatient Prescriptions  Medication Sig Dispense Refill  . aspirin 325 MG tablet Take 325 mg by mouth daily.        . atenolol-chlorthalidone (TENORETIC) 50-25 MG per tablet TAKE 1 TABLET ONCE DAILY  90 tablet  3  . Flaxseed, Linseed, (FLAX SEED OIL) 1000 MG CAPS Take 1 capsule by mouth 2 (two) times daily.        . isosorbide mononitrate (IMDUR) 60 MG 24 hr tablet Take 1 tablet (60 mg total) by mouth daily.  30 tablet  8  . naproxen sodium (ANAPROX) 220 MG tablet Take 220 mg by mouth as needed.        . nitroGLYCERIN (NITROSTAT) 0.4 MG SL tablet Place 0.4 mg under the tongue every 5 (five) minutes as needed.        . potassium chloride SA (K-DUR,KLOR-CON) 20 MEQ  tablet Take 1 tablet (20 mEq total) by mouth 2 (two) times daily.  180 tablet  3  . Red Yeast Rice Extract 600 MG CAPS Take 1 capsule by mouth 3 (three) times daily.          Allergies  Allergen Reactions  . Statins     History   Social History  . Marital Status: Married    Spouse Name: N/A    Number of Children: N/A  . Years of Education: N/A   Occupational History  . Not on file.   Social History Main Topics  . Smoking status: Former Smoker  . Smokeless tobacco: Never Used   Comment: quit in 1985. smoked 1ppd for 35 years   . Alcohol Use: No  . Drug Use: No  . Sexually Active: Not on file   Other Topics Concern  . Not on file   Social History Narrative   Married with children. 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 10:20 am     No family history on file.  Review of Systems:  As stated in the HPI and otherwise negative.   BP 122/60    Pulse 60  Ht 5' 8" (1.727 m)  Wt 188 lb 12.8 oz (85.639 kg)  BMI 28.71 kg/m2  Physical Examination: General: Well developed, well nourished, NAD HEENT: OP clear, mucus membranes moist SKIN: warm, dry. No rashes. Neuro: No focal deficits Musculoskeletal: Muscle strength 5/5 all ext Psychiatric: Mood and affect normal Neck: No JVD, no carotid bruits, no thyromegaly, no lymphadenopathy. Lungs:Clear bilaterally, no wheezes, rhonci, crackles Cardiovascular: Regular rate and rhythm. No murmurs, gallops or rubs. Abdomen:Soft. Bowel sounds present. Non-tender.  Extremities: No lower extremity edema. Pulses are 2 + in the bilateral DP/PT.   

## 2011-07-27 NOTE — Interval H&P Note (Signed)
History and Physical Interval Note:  07/27/2011 10:06 AM  [name]  has presented today for surgery, with the diagnosis of chest pain  The various methods of treatment have been discussed with the patient and family. After consideration of risks, benefits and other options for treatment, the patient has consented to  Procedure(s): LEFT HEART CATHETERIZATION WITH CORONARY ANGIOGRAM as a surgical intervention .  The patients' history has been reviewed, patient examined, no change in status, stable for surgery.  I have reviewed the patients' chart and labs.  Questions were answered to the patient's satisfaction.     Addaline Peplinski

## 2011-08-16 ENCOUNTER — Ambulatory Visit (INDEPENDENT_AMBULATORY_CARE_PROVIDER_SITE_OTHER): Payer: Medicare Other | Admitting: Cardiovascular Disease

## 2011-08-16 ENCOUNTER — Encounter: Payer: Self-pay | Admitting: Cardiovascular Disease

## 2011-08-16 VITALS — BP 123/71 | HR 73 | Ht 68.0 in | Wt 192.0 lb

## 2011-08-16 DIAGNOSIS — I2510 Atherosclerotic heart disease of native coronary artery without angina pectoris: Secondary | ICD-10-CM

## 2011-08-16 NOTE — Patient Instructions (Signed)
Your physician wants you to follow-up in: 12 months.  You will receive a reminder letter in the mail two months in advance. If you don't receive a letter, please call our office to schedule the follow-up appointment.  Your physician recommends that you return for fasting lab work later this week. --Lipid profile

## 2011-08-16 NOTE — Progress Notes (Signed)
History of Present Illness: 77 yo WM with history of CAD s/p placement of drug eluting stent in mid LAD in January of 2005 with residual disease in proximal LAD and RCA, HTN, Hyperlipidemia and GERD who presents today for  cardiac follow up. I saw him 07/20/11 and he had c/o daily chest pains. I arranged a left heart cath which was performed on 07/27/11 and showed stable moderate CAD with patent LAD stent. He has not tolerated statins in the past secondary to myalgias. His main complaint is his back pain. I saw him last in July 2012. He had a right total knee replacement per Dr. Priscille Kluver in August 2012.   He has been doing well since his cath. He has not been exercising. He continues to have substernal burning after eating chocolate and he feels like this is hernia. This is substernal without radiation, occurs with exertion. No diaphoresis.   His primary care is Dr. Arthur Holms.   Past Medical History  Diagnosis Date  . Coronary atherosclerosis of unspecified type of vessel, native or graft     s/p stent mid LAD 2005  . DJD (degenerative joint disease)   . GERD (gastroesophageal reflux disease)   . DM (diabetes mellitus)   . HTN (hypertension)   . Hyperlipidemia     Past Surgical History  Procedure Date  . Ptca     Hx of it.   . Knee surgery   . Kidney stone removal   . Sigmoidoscopy     Current Outpatient Prescriptions  Medication Sig Dispense Refill  . aspirin EC 325 MG tablet Take 325 mg by mouth daily.      Marland Kitchen atenolol-chlorthalidone (TENORETIC) 50-25 MG per tablet Take 1 tablet by mouth daily.  90 tablet  3  . Flaxseed, Linseed, (FLAX SEED OIL) 1000 MG CAPS Take 1 capsule by mouth 2 (two) times daily.        . isosorbide mononitrate (IMDUR) 60 MG 24 hr tablet Take 1 tablet (60 mg total) by mouth daily.  90 tablet  3  . naproxen sodium (ANAPROX) 220 MG tablet Take 220 mg by mouth daily as needed. For pain      . nitroGLYCERIN (NITROSTAT) 0.4 MG SL tablet Place 0.4 mg under the tongue  every 5 (five) minutes as needed. For chest pain      . potassium chloride SA (K-DUR,KLOR-CON) 20 MEQ tablet Take 1 tablet (20 mEq total) by mouth 2 (two) times daily.  180 tablet  3  . Red Yeast Rice Extract 600 MG CAPS Take 1 capsule by mouth 2 (two) times daily.         Allergies  Allergen Reactions  . Statins Other (See Comments)    Caused soreness     History   Social History  . Marital Status: Married    Spouse Name: N/A    Number of Children: N/A  . Years of Education: N/A   Occupational History  . Not on file.   Social History Main Topics  . Smoking status: Former Games developer  . Smokeless tobacco: Never Used   Comment: quit in 1985. smoked 1ppd for 35 years   . Alcohol Use: No  . Drug Use: No  . Sexually Active: Not on file   Other Topics Concern  . Not on file   Social History Narrative   Married with children. 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 10:20 am     No family history on  file.  Review of Systems:  As stated in the HPI and otherwise negative.   BP 123/71  Pulse 73  Ht 5\' 8"  (1.727 m)  Wt 192 lb (87.091 kg)  BMI 29.19 kg/m2  Physical Examination: General: Well developed, well nourished, NAD HEENT: OP clear, mucus membranes moist SKIN: warm, dry. No rashes. Neuro: No focal deficits Musculoskeletal: Muscle strength 5/5 all ext Psychiatric: Mood and affect normal Neck: No JVD, no carotid bruits, no thyromegaly, no lymphadenopathy. Lungs:Clear bilaterally, no wheezes, rhonci, crackles Cardiovascular: Regular rate and rhythm. No murmurs, gallops or rubs. Abdomen:Soft. Bowel sounds present. Non-tender.  Extremities: No lower extremity edema. Pulses are 2 + in the bilateral DP/PT.  Cardiac cath 07/27/11:  Hemodynamic Findings:  Central aortic pressure: 97/52  Left ventricular pressure: 102/3/16  Angiographic Findings:  Left main: Distal 10% stenosis.  Left Anterior Descending Artery: Proximal with  diffuse 20% stenosis. 40% mid stenosis prior to mid stent. The stent has moderate, 40% restenosis that does not appear to be flow limiting. The distal vessel has several 60% stenoses which do not appear to be flow limiting. The first diagonal is small in caliber and has ostial 50% stenosis. The second diagonal is jailed by the stent and has an ostial  Circumflex Artery: Large caliber vessel. 40% mid stenosis.  Right Coronary Artery: 40% mid stenosis. 30% distal stenosis. 50% PDA stenosis.  Left Ventricular Angiogram: LVEF=55%.  Impression:  1. Moderate CAD  2. Patent stent mid LAD with moderate restenosis  3. Normal LV systolic function

## 2011-08-16 NOTE — Assessment & Plan Note (Signed)
Stable moderate non-obstructive disease by cath 07/27/11. Continue current therapy. He does not tolerate statins. Will recheck lipids this week. We may try Zetia if LDL not at goal with red yeast rice alone.

## 2011-08-19 ENCOUNTER — Other Ambulatory Visit (INDEPENDENT_AMBULATORY_CARE_PROVIDER_SITE_OTHER): Payer: Medicare Other | Admitting: *Deleted

## 2011-08-19 DIAGNOSIS — I2510 Atherosclerotic heart disease of native coronary artery without angina pectoris: Secondary | ICD-10-CM

## 2011-08-19 LAB — LIPID PANEL
HDL: 38.7 mg/dL — ABNORMAL LOW (ref >39.00)
LDL Cholesterol: 108 mg/dL — ABNORMAL HIGH (ref 0–99)
Total CHOL/HDL Ratio: 4
Triglycerides: 137 mg/dL (ref 0.0–149.0)

## 2011-09-30 ENCOUNTER — Telehealth: Payer: Self-pay | Admitting: Cardiovascular Disease

## 2011-09-30 DIAGNOSIS — E785 Hyperlipidemia, unspecified: Secondary | ICD-10-CM

## 2011-09-30 NOTE — Telephone Encounter (Signed)
Pt returning lipid result call from [facility]. Pt thought he had tried Zetia in the past. I reviewed EMR chart and found that in 2007 with Dr. Ival Bible OV note pt was intolerant to Crestor and Zetia due to Myalgias. Mylo Red RN

## 2011-09-30 NOTE — Telephone Encounter (Signed)
Pt rtn pat's call from 09-28-11

## 2011-10-03 NOTE — Telephone Encounter (Signed)
Spoke with pt and he would like to make appt with Lipid clinic.  Will make referral and have scheduling contact pt to arrange appt

## 2011-10-03 NOTE — Telephone Encounter (Signed)
Addended by: Dossie Arbour on: 10/03/2011 04:07 PM   Modules accepted: Orders

## 2011-10-03 NOTE — Telephone Encounter (Signed)
Pt has been intolerant to statins and Zetia. Can we refer him to the lipid clinic if he is willing and try non-traditional meds for better lipid control? Thanks, chris

## 2011-10-06 ENCOUNTER — Ambulatory Visit (INDEPENDENT_AMBULATORY_CARE_PROVIDER_SITE_OTHER): Payer: Medicare Other | Admitting: Pharmacist

## 2011-10-06 DIAGNOSIS — E785 Hyperlipidemia, unspecified: Secondary | ICD-10-CM

## 2011-10-06 MED ORDER — PRAVASTATIN SODIUM 20 MG PO TABS: 20.0000 mg | ORAL_TABLET | Freq: Every evening | ORAL | Status: DC

## 2011-10-06 NOTE — Patient Instructions (Addendum)
It was good to meet you!  Total Cholesterol 174, Triglycerides 137, HDL 38.7, LDL 108 (Goal LDL less than 70)  1) Stop the Red Yeast Rice. Start pravastatin 20mg  in the evening. Call the clinic [phone]) if you experience issues/muscle pains. 2) Try to increase your exercise on the bike to at least 3 days (30 minutes each time) per week. Continue to do strength training twice a week. 3) Try to limit the number of days you have sausage/bacon and fried foods. Try to eat more lean meats and more more fresh fruits and veggies.   Follow-up: in the clinic in 6 weeks with fasting blood work. We will call you to make your follow-up appointment.

## 2011-10-06 NOTE — Progress Notes (Signed)
76yoM presenting for initial consultation for lipid management. PMH: CAD s/p placement of drug eluting stent in mid LAD in January of 2005 with residual disease in proximal LAD and RCA, HTN, hyperlipidemia and GERD.  Currently only taking red yeast rice (600mg  TID) due to history of intolerance of statins (simvastatin, atorvastatin) due to joint and muscle aches. Had previously taken Zetia but discontinued due to high cost. Patient has concerns for cost of medications but would be willing to try a different statin.  Diet: Breakfast: 2 eggs with sausage/bacon no more than 3 times/wk. Other days will have oatmeal, cereal (cheerios or cinnamon toast crunch), or waffles. Occasionally makes pancakes (once a month). Lunch: broiled white fish, baked potato, slaw Dinner: country style steak, potatoes, green beans, pinto beans, green peas, out to eat [facility]) once every 6 weeks Snacks: peanut butter crackers and half a glass of milk every once in a while, eats very little sweets though he likes them  Exercise: Used to walk on the treadmill 30 min/day but his bone doctor told him to start using a bike instead. Has had a knee replaced. Currently only doing 5-10 mins of the bike once a week. Has a 200lb gym set that he used to use 3 times a week for 30 mins of weight lifting (50 lbs). Currently he's only doing twice a week for 30 min (40 lbs).

## 2011-10-06 NOTE — Assessment & Plan Note (Signed)
Risk Assessment: CAD, 76yo, HTN, smoked 1ppd for 35 yrs (quit date 1985) Labs: TC 174, TG 137, HDL 38.7, LDL 108 Goals: TC<200, TG<150, HDL>40, LDL<70 Plan: 1. Stop Red Yeast Rice and start pravastatin 20mg  QHS. Pt was counseled to call the clinic if he experiences muscle pain. Prescription was called in to his pharmacy.  2. Goal to increase exercise on the bike to 30 minutes at least 3 days per week. Continue strength training twice a week.  3. Counseled to try to limit the number of days he has sausage/bacon and fried foods and to try to eat more lean meats and more more fresh fruits and vegetables.   4. Follow-up in the clinic in 6 weeks with fasting blood work.

## 2011-11-02 ENCOUNTER — Other Ambulatory Visit: Payer: Self-pay

## 2011-11-02 MED ORDER — PRAVASTATIN SODIUM 20 MG PO TABS: 20.0000 mg | ORAL_TABLET | Freq: Every evening | ORAL | Status: DC

## 2011-12-15 ENCOUNTER — Other Ambulatory Visit: Payer: Self-pay | Admitting: Pharmacist

## 2011-12-15 DIAGNOSIS — E785 Hyperlipidemia, unspecified: Secondary | ICD-10-CM

## 2011-12-23 ENCOUNTER — Other Ambulatory Visit (INDEPENDENT_AMBULATORY_CARE_PROVIDER_SITE_OTHER): Payer: Medicare Other

## 2011-12-23 DIAGNOSIS — E785 Hyperlipidemia, unspecified: Secondary | ICD-10-CM

## 2011-12-23 LAB — LIPID PANEL
Cholesterol: 131 mg/dL (ref 0–200)
HDL: 35.5 mg/dL — ABNORMAL LOW (ref >39.00)
VLDL: 18.4 mg/dL (ref 0.0–40.0)

## 2011-12-23 LAB — HEPATIC FUNCTION PANEL: Albumin: 4 g/dL (ref 3.5–5.2)

## 2012-01-03 ENCOUNTER — Ambulatory Visit (INDEPENDENT_AMBULATORY_CARE_PROVIDER_SITE_OTHER): Payer: Medicare Other | Admitting: Pharmacist

## 2012-01-03 DIAGNOSIS — E785 Hyperlipidemia, unspecified: Secondary | ICD-10-CM

## 2012-01-03 NOTE — Patient Instructions (Addendum)
Continue Pravastatin 20mg  daily   Try to limit your starchy foods such as potatoes and bread to 1 serving per meal.   Set a goal to exercise at least 3 days per week.   Recheck labs in 3-4 weeks.

## 2012-01-06 NOTE — Assessment & Plan Note (Signed)
Cholesterol improved with addition of pravastatin.  TC- 131 (goal<200), TG- 92 (goal<150), HDL- 35 (goal>40), LDL- 77 (goal<70).  LFTs are WNL.  Will continue current medications.  Encouraged pt to continue to limit his starches to only 1 per meal and try to exercise 3-4 days per week.  Will recheck labs in 3-4 months.  If they remain stable, will have Dr. Clifton Cephus follow at his regular follow up visits.

## 2012-01-06 NOTE — Progress Notes (Signed)
77yoM presenting for follow up visit for lipid management. PMH: CAD s/p placement of drug eluting stent in mid LAD in January of 2005 with residual disease in proximal LAD and RCA, HTN, hyperlipidemia and GERD.  At last visit, we started patient on pravastatin 20mg  daily.  He did report sore joints for a few days so he stopped the medication.  When the pain did not change after he was off the medicine, he started taking it again.  He has not noticed any difference since restarting the pravastatin.     Diet: He does report trying to decrease his sweets since last visit but admits that his weakness is still bread and potatoes.   Breakfast: 2 eggs with sausage/bacon no more than 3 times/wk. Other days will have oatmeal, cereal (cheerios or cinnamon toast crunch), or waffles. Occasionally makes pancakes (once a month). Lunch: broiled white fish, baked potato, slaw Dinner: country style steak, potatoes, green beans, pinto beans, green peas, out to eat [facility]) once every 6 weeks Snacks: peanut butter crackers and half a glass of milk every once in a while, eats very little sweets though he likes them  Exercise: His exercise routine has been thrown off slightly because he was on vacation for 3 weeks.  He is doing more work in the garden and yard than in the gym this summer but still tries to get some time on the bike and lifting weights.   Current Outpatient Prescriptions  Medication Sig Dispense Refill  . aspirin EC 325 MG tablet Take 325 mg by mouth daily.      Marland Kitchen atenolol-chlorthalidone (TENORETIC) 50-25 MG per tablet Take 1 tablet by mouth daily.  90 tablet  3  . Flaxseed, Linseed, (FLAX SEED OIL) 1000 MG CAPS Take 1 capsule by mouth 2 (two) times daily.        . isosorbide mononitrate (IMDUR) 60 MG 24 hr tablet Take 1 tablet (60 mg total) by mouth daily.  90 tablet  3  . naproxen sodium (ANAPROX) 220 MG tablet Take 220 mg by mouth daily as needed. 1-2 tablets twice daily      . nitroGLYCERIN  (NITROSTAT) 0.4 MG SL tablet Place 0.4 mg under the tongue every 5 (five) minutes as needed. For chest pain      . potassium chloride SA (K-DUR,KLOR-CON) 20 MEQ tablet Take 1 tablet (20 mEq total) by mouth 2 (two) times daily.  180 tablet  3  . pravastatin (PRAVACHOL) 20 MG tablet Take 1 tablet (20 mg total) by mouth every evening.  30 tablet  6    Allergies  Allergen Reactions  . Statins Other (See Comments)    Caused soreness (Lipitor, Zocor)

## 2012-02-23 ENCOUNTER — Other Ambulatory Visit: Payer: Self-pay | Admitting: Cardiovascular Disease

## 2012-04-19 ENCOUNTER — Other Ambulatory Visit (INDEPENDENT_AMBULATORY_CARE_PROVIDER_SITE_OTHER): Payer: Medicare Other

## 2012-04-19 DIAGNOSIS — E785 Hyperlipidemia, unspecified: Secondary | ICD-10-CM

## 2012-04-19 LAB — LIPID PANEL
Total CHOL/HDL Ratio: 4
Triglycerides: 120 mg/dL (ref 0.0–149.0)

## 2012-04-19 LAB — HEPATIC FUNCTION PANEL
ALT: 28 U/L (ref 0–53)
AST: 30 U/L (ref 0–37)
Bilirubin, Direct: 0.1 mg/dL (ref 0.0–0.3)
Total Protein: 7.1 g/dL (ref 6.0–8.3)

## 2012-04-24 ENCOUNTER — Ambulatory Visit (INDEPENDENT_AMBULATORY_CARE_PROVIDER_SITE_OTHER): Payer: Medicare Other | Admitting: Pharmacist

## 2012-04-24 DIAGNOSIS — E785 Hyperlipidemia, unspecified: Secondary | ICD-10-CM

## 2012-04-24 NOTE — Patient Instructions (Addendum)
Continue Pravastatin 20mg  once daily  Try to watch your portion sizes at dinner.  Start trying to exercise at least 15 minutes most days of the week.   We will follow up in 6 months.

## 2012-04-26 NOTE — Progress Notes (Signed)
[name] is a 77 yo male who presents for a follow-up visit regarding hyperlipidemia. He reports tolerance and compliance  with his current antilipemic regimen of pravastatin 20 mg and flaxseed.   Exercise: [name] has not been exercising much recently. He enjoys working outdoors, but his persistent back pain has limited his physical activity. He has also experienced pain in his knees, which is resolved by movement. He hopes to get back into golf and riding his bike regularly for exercise.  Diet: [name] reports a diet rich in starches and carbohydrates. His breakfast usually consists of eggs (2-3 times/week), cereal, and an occasional pancake.  For lunch, he (usually) will eat a sandwich. Dinner is his largest meal of the day, consisting of 1 to 2 plates late in the evening. He reports eating out more (i.e. [employer]), but enjoys bread, creamed potatoes, and gravy when at home. [name] reports falling asleep shortly after dinner.    Current Outpatient Prescriptions on File Prior to Visit  Medication Sig Dispense Refill  . aspirin EC 325 MG tablet Take 325 mg by mouth daily.      Marland Kitchen atenolol-chlorthalidone (TENORETIC) 50-25 MG per tablet Take 1 tablet by mouth daily.  90 tablet  3  . Flaxseed, Linseed, (FLAX SEED OIL) 1000 MG CAPS Take 1 capsule by mouth 2 (two) times daily.        . isosorbide mononitrate (IMDUR) 60 MG 24 hr tablet Take 1 tablet (60 mg total) by mouth daily.  90 tablet  3  . naproxen sodium (ANAPROX) 220 MG tablet Take 220 mg by mouth daily as needed. 1-2 tablets twice daily      . nitroGLYCERIN (NITROSTAT) 0.4 MG SL tablet Place 0.4 mg under the tongue every 5 (five) minutes as needed. For chest pain      . potassium chloride SA (K-DUR,KLOR-CON) 20 MEQ tablet Take 1 tablet (20 mEq total) by mouth 2 (two) times daily.  180 tablet  3  . pravastatin (PRAVACHOL) 20 MG tablet Take 1 tablet (20 mg total) by mouth every evening.  30 tablet  6  . pravastatin (PRAVACHOL) 20 MG  tablet TAKE 1 TABLET (20 MG TOTAL) BY MOUTH EVERY EVENING.  30 tablet  2    Allergies  Allergen Reactions  . Statins Other (See Comments)    Caused soreness (Lipitor, Zocor)

## 2012-04-26 NOTE — Assessment & Plan Note (Signed)
Mr. Spilde' most recent LDL (90 mg/dL) is not at goal. His current regimen of pravastatin 20 mg and flaxseed may be more effective if combined with lifestyle changes (i.e. improved diet and exercise). Goals: TC < 200 mg/dL, TG < 161 mg/dL, HDL > 40 mg/dL, LDL <09 mg/dL.  Pt advised to decrease portions at every meal.  He was encouraged to exercise 30 minutes for 5 days/week or 150 minutes/week. Continue regimen of pravastatin 20 mg and flaxseed. Obtain fasting lipid panel and follow-up in 6 months.

## 2012-05-24 ENCOUNTER — Other Ambulatory Visit: Payer: Self-pay | Admitting: Cardiovascular Disease

## 2012-05-24 NOTE — Telephone Encounter (Signed)
Fax Received. Refill Completed. Citlally Captain Chowoe (R.M.A)   

## 2012-07-23 ENCOUNTER — Encounter: Payer: Self-pay | Admitting: Internal Medicine

## 2012-07-23 ENCOUNTER — Ambulatory Visit (INDEPENDENT_AMBULATORY_CARE_PROVIDER_SITE_OTHER): Payer: Medicare Other | Admitting: Internal Medicine

## 2012-07-23 ENCOUNTER — Ambulatory Visit (INDEPENDENT_AMBULATORY_CARE_PROVIDER_SITE_OTHER)
Admission: RE | Admit: 2012-07-23 | Discharge: 2012-07-23 | Disposition: A | Discharge status: Home / Self Care | Payer: Medicare Other | Source: Ambulatory Visit | Attending: Internal Medicine | Admitting: Internal Medicine

## 2012-07-23 VITALS — BP 132/78 | HR 62 | Temp 97.4°F | Resp 12 | Wt 194.0 lb

## 2012-07-23 DIAGNOSIS — R209 Unspecified disturbances of skin sensation: Secondary | ICD-10-CM

## 2012-07-23 DIAGNOSIS — M1990 Unspecified osteoarthritis, unspecified site: Secondary | ICD-10-CM

## 2012-07-23 DIAGNOSIS — R202 Paresthesia of skin: Secondary | ICD-10-CM

## 2012-07-23 DIAGNOSIS — E119 Type 2 diabetes mellitus without complications: Secondary | ICD-10-CM

## 2012-07-23 DIAGNOSIS — I10 Essential (primary) hypertension: Secondary | ICD-10-CM

## 2012-07-23 NOTE — Patient Instructions (Addendum)
Change in sensation left shoulder/neck area - concerned for degenerative disk disease. IN 2011 there were changes already.  Plan  Xrays of the neck  Left leg pain and loss of sensation left foot - good circulation on exam. This may be related to degenerative disk disease in the lumbar spine. There may also be a peripheral neuropathy - nerve damage locally - that can cause the foot to be numb. B12 is unlikely with a normal sized red blood cell but we will check B12 level.  Plan B12 level, general chemistry panel, check for diabetes.  Xray lumbar spine.

## 2012-07-23 NOTE — Progress Notes (Signed)
  Subjective:    Patient ID: [name], male    DOB: October 09, 1934, 78 y.o.   MRN: [id number]  HPI [name] presents for paresthesia affecting the trapezius area left which is position - tilting his head to the right will relieve the discomfort. Reviewed prior C-spine films from '11 and there was evidence of DDD C4-5 right. He reports there is crepitus with neck rotation.  He has dense paresthesia in the left distal lower extremity. He also has pain with walking from buttock to foot that is relieved with rest. L-spine films in '11 revealed anterolisthesis but not  DDD. He is diabetic and may have peripheral neuropathy related to that. In both instances he is concerned about B12 deficiency although the MCV has been normal.     Review of Systems     Objective:   Physical Exam Filed Vitals:   07/23/12 1607  BP: 132/78  Pulse: 62  Temp: 97.4 F (36.3 C)  Resp: 12   gen'l- WNWD heavyset white man HEENT- normal Neck -normal ROM Cor- RRR, 2+ DP pusle left, good capillary refill Pulm - normal respirations Neuro - A&O x 3., CN II-XII grossly normal, MS 5/5 grip and UE/LE strength, DTRs 2+ and symmetrical, sensation to light touch and pin-prick and vibration normal UE left; decreased sensation, especially vibration left foot.  C-spine series: IMPRESSION:  1. Overall, cervical spine is very similar in appearance compared  to December 2011. There is a persistent mild convex left curvature  of the cervical spine. Disc space narrowing most prominent at C5-6  and to a lesser degree at C4-5 is similar to prior. There is  stable neural foraminal narrowing on the right is C4-5. There is  suggestion of new mild neural foraminal narrowing on the left at  the C3-4.  2. No acute bony abnormality.  Lumbar spine series: IMPRESSION:  1. Stable grade 1 retrolisthesis of L1 on L2 and stable grade 1  anterolisthesis of L4 on L5. These findings may be related to  chronic facet joint degenerative  changes. No pars defects are  identified.  2. No significant acute bony abnormality or degenerative change  identified compared to lumbar spine radiographs of 05/18/2010.  3. Stable right renal calculus.   B12 level pending      Assessment & Plan:  1. Change in sensation left shoulder/neck area - concerned for degenerative disk disease. IN 2011 there were changes already.  Plan  Xrays of the neck  Addendum - DDD C5-6,C4-5 but unchaged Plan- Range of motion exercise  2. Left leg pain and loss of sensation left foot - good circulation on exam. This may be related to degenerative disk disease in the lumbar spine. There may also be a peripheral neuropathy - nerve damage locally - that can cause the foot to be numb. B12 is unlikely with a normal sized red blood cell but we will check B12 level.  Plan B12 level, general chemistry panel, check for diabetes.  Xray lumbar spine.   Addendum  No significant DDD to explain symptoms.    Labs pending  Plan May need NCS to further develop diagnosis

## 2012-07-25 ENCOUNTER — Other Ambulatory Visit (INDEPENDENT_AMBULATORY_CARE_PROVIDER_SITE_OTHER): Payer: Medicare Other

## 2012-07-25 DIAGNOSIS — R209 Unspecified disturbances of skin sensation: Secondary | ICD-10-CM

## 2012-07-25 DIAGNOSIS — I10 Essential (primary) hypertension: Secondary | ICD-10-CM

## 2012-07-25 DIAGNOSIS — R202 Paresthesia of skin: Secondary | ICD-10-CM

## 2012-07-25 DIAGNOSIS — E119 Type 2 diabetes mellitus without complications: Secondary | ICD-10-CM

## 2012-07-25 LAB — VITAMIN B12: Vitamin B-12: 242 pg/mL (ref 211–911)

## 2012-07-25 LAB — COMPREHENSIVE METABOLIC PANEL
ALT: 26 U/L (ref 0–53)
Albumin: 4.2 g/dL (ref 3.5–5.2)
CO2: 29 mEq/L (ref 19–32)
Calcium: 9.4 mg/dL (ref 8.4–10.5)
Chloride: 105 mEq/L (ref 96–112)
GFR: 85.11 mL/min (ref >60.00)
Glucose, Bld: 98 mg/dL (ref 70–99)
Potassium: 3.7 mEq/L (ref 3.5–5.1)
Sodium: 140 mEq/L (ref 135–145)
Total Bilirubin: 0.8 mg/dL (ref 0.3–1.2)
Total Protein: 7 g/dL (ref 6.0–8.3)

## 2012-07-25 NOTE — Assessment & Plan Note (Signed)
BP Readings from Last 3 Encounters:  07/23/12 132/78  08/16/11 123/71  07/27/11 100/54   Good control of BP on present regimen

## 2012-07-25 NOTE — Assessment & Plan Note (Signed)
Question of progressive problem with glucose metabolism. A1C pending

## 2012-08-02 ENCOUNTER — Other Ambulatory Visit: Payer: Self-pay | Admitting: Cardiovascular Disease

## 2012-08-03 ENCOUNTER — Other Ambulatory Visit: Payer: Self-pay | Admitting: *Deleted

## 2012-08-03 MED ORDER — POTASSIUM CHLORIDE CRYS ER 20 MEQ PO TBCR: 20.0000 meq | EXTENDED_RELEASE_TABLET | Freq: Two times a day (BID) | ORAL | Status: DC

## 2012-08-10 ENCOUNTER — Other Ambulatory Visit: Payer: Self-pay

## 2012-08-10 MED ORDER — ATENOLOL-CHLORTHALIDONE 50-25 MG PO TABS: 1.0000 | ORAL_TABLET | Freq: Every day | ORAL | Status: DC

## 2012-08-10 MED ORDER — ISOSORBIDE MONONITRATE ER 60 MG PO TB24: 60.0000 mg | ORAL_TABLET | Freq: Every day | ORAL | Status: DC

## 2012-08-25 ENCOUNTER — Other Ambulatory Visit: Payer: Self-pay

## 2012-08-29 ENCOUNTER — Encounter: Payer: Medicare Other | Admitting: Cardiovascular Disease

## 2012-08-29 NOTE — Progress Notes (Signed)
Pt did not show for appt. cdm  

## 2012-10-01 ENCOUNTER — Encounter: Payer: Self-pay | Admitting: Cardiovascular Disease

## 2012-10-01 ENCOUNTER — Ambulatory Visit (INDEPENDENT_AMBULATORY_CARE_PROVIDER_SITE_OTHER): Payer: Medicare Other | Admitting: Cardiovascular Disease

## 2012-10-01 VITALS — BP 130/80 | HR 73 | Ht 68.5 in | Wt 197.0 lb

## 2012-10-01 DIAGNOSIS — I2510 Atherosclerotic heart disease of native coronary artery without angina pectoris: Secondary | ICD-10-CM

## 2012-10-01 MED ORDER — ASPIRIN EC 81 MG PO TBEC: 81.0000 mg | DELAYED_RELEASE_TABLET | Freq: Every day | ORAL | Status: DC

## 2012-10-01 NOTE — Progress Notes (Signed)
History of Present Illness: 78 yo WM with history of CAD s/p placement of drug eluting stent in mid LAD in January of 2005 with residual disease in proximal LAD and RCA, HTN, Hyperlipidemia and GERD who presents today for cardiac follow up. I saw him 07/20/11 and he had c/o daily chest pains. I arranged a left heart cath which was performed on 07/27/11 and showed stable moderate CAD with patent LAD stent. He has not tolerated statins in the past secondary to myalgias. His main complaint is his back pain.   He is here today for follow up. He has been doing well since his cath. He has not been exercising. He does note chronic stable angina with chest pains with heavy exertion. He has been cutting wood and doing well. Breathing has been stable.   Primary Care Physician: Norins  Last Lipid Profile:Lipid Panel     Component Value Date/Time   CHOL 148 04/19/2012 0927   TRIG 120.0 04/19/2012 0927   HDL 33.70* 04/19/2012 0927   CHOLHDL 4 04/19/2012 0927   VLDL 24.0 04/19/2012 0927   LDLCALC 90 04/19/2012 0927     Past Medical History  Diagnosis Date  . Coronary atherosclerosis of unspecified type of vessel, native or graft     s/p stent mid LAD 2005  . DJD (degenerative joint disease)   . GERD (gastroesophageal reflux disease)   . DM (diabetes mellitus)   . HTN (hypertension)   . Hyperlipidemia     Past Surgical History  Procedure Laterality Date  . Ptca      Hx of it.   . Knee surgery    . Kidney stone removal    . Sigmoidoscopy    . Total knee arthroplasty  02/2011    Right knee    Current Outpatient Prescriptions  Medication Sig Dispense Refill  . aspirin EC 325 MG tablet Take 325 mg by mouth daily.      Marland Kitchen atenolol-chlorthalidone (TENORETIC) 50-25 MG per tablet Take 1 tablet by mouth daily.  90 tablet  2  . Flaxseed, Linseed, (FLAX SEED OIL) 1000 MG CAPS Take 1 capsule by mouth 2 (two) times daily.        . isosorbide mononitrate (IMDUR) 60 MG 24 hr tablet Take 1 tablet (60  mg total) by mouth daily.  90 tablet  2  . naproxen sodium (ANAPROX) 220 MG tablet Take 220 mg by mouth daily as needed. 1-2 tablets twice daily      . nitroGLYCERIN (NITROSTAT) 0.4 MG SL tablet Place 0.4 mg under the tongue every 5 (five) minutes as needed. For chest pain      . potassium chloride SA (K-DUR,KLOR-CON) 20 MEQ tablet Take 1 tablet (20 mEq total) by mouth 2 (two) times daily.  180 tablet  0  . pravastatin (PRAVACHOL) 20 MG tablet TAKE 1 TABLET (20 MG TOTAL) BY MOUTH EVERY EVENING.  30 tablet  4   No current facility-administered medications for this visit.    Allergies  Allergen Reactions  . Statins Other (See Comments)    Caused soreness (Lipitor, Zocor)    History   Social History  . Marital Status: Married    Spouse Name: N/A    Number of Children: N/A  . Years of Education: N/A   Occupational History  . Not on file.   Social History Main Topics  . Smoking status: Former Games developer  . Smokeless tobacco: Never Used     Comment: quit in 1985. smoked 1ppd for  35 years   . Alcohol Use: No  . Drug Use: No  . Sexually Active: Not on file   Other Topics Concern  . Not on file   Social History Narrative   Married with children. 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 10:20 am     No family history on file.  Review of Systems:  As stated in the HPI and otherwise negative.   BP 130/80  Pulse 73  Ht 5' 8.5" (1.74 m)  Wt 197 lb (89.359 kg)  BMI 29.51 kg/m2  SpO2 98%  Physical Examination: General: Well developed, well nourished, NAD HEENT: OP clear, mucus membranes moist SKIN: warm, dry. No rashes. Neuro: No focal deficits Musculoskeletal: Muscle strength 5/5 all ext Psychiatric: Mood and affect normal Neck: No JVD, no carotid bruits, no thyromegaly, no lymphadenopathy. Lungs:Clear bilaterally, no wheezes, rhonci, crackles Cardiovascular: Regular rate and rhythm. No murmurs, gallops or rubs. Abdomen:Soft. Bowel  sounds present. Non-tender.  Extremities: No lower extremity edema. Pulses are 2 + in the bilateral DP/PT.  EKG: NSR, rate 73 bpm. LAFB.   Assessment and Plan:   1. CORONARY ARTERY DISEASE: Stable moderate non-obstructive disease by cath 07/27/11. Continue current therapy. He is tolerating low dose Pravastatin. Will lower ASA to 81 mg per day.

## 2012-10-01 NOTE — Patient Instructions (Addendum)
Your physician wants you to follow-up in:  12 months.  You will receive a reminder letter in the mail two months in advance. If you don't receive a letter, please call our office to schedule the follow-up appointment.  Your physician has recommended you make the following change in your medication: Decrease aspirin to 81 mg by mouth daily   

## 2012-10-23 ENCOUNTER — Other Ambulatory Visit: Payer: Self-pay | Admitting: Cardiovascular Disease

## 2012-11-13 ENCOUNTER — Other Ambulatory Visit: Payer: Self-pay | Admitting: Cardiovascular Disease

## 2012-11-15 ENCOUNTER — Other Ambulatory Visit: Payer: Self-pay | Admitting: *Deleted

## 2012-11-15 MED ORDER — ISOSORBIDE MONONITRATE ER 60 MG PO TB24: 60.0000 mg | ORAL_TABLET | Freq: Every day | ORAL | Status: DC

## 2012-11-19 ENCOUNTER — Other Ambulatory Visit: Payer: Self-pay | Admitting: Cardiovascular Disease

## 2012-11-19 ENCOUNTER — Other Ambulatory Visit: Payer: Self-pay | Admitting: *Deleted

## 2012-11-19 MED ORDER — POTASSIUM CHLORIDE CRYS ER 20 MEQ PO TBCR: 20.0000 meq | EXTENDED_RELEASE_TABLET | Freq: Two times a day (BID) | ORAL | Status: DC

## 2012-11-19 MED ORDER — ATENOLOL-CHLORTHALIDONE 50-25 MG PO TABS: 1.0000 | ORAL_TABLET | Freq: Every day | ORAL | Status: DC

## 2012-11-20 ENCOUNTER — Other Ambulatory Visit: Payer: Self-pay

## 2012-11-20 ENCOUNTER — Telehealth: Payer: Self-pay

## 2012-11-20 MED ORDER — ISOSORBIDE MONONITRATE ER 60 MG PO TB24: 60.0000 mg | ORAL_TABLET | Freq: Every day | ORAL | Status: DC

## 2012-11-20 NOTE — Telephone Encounter (Signed)
pt rqst 15 day supply of isosorbide to be sent to local [facility] in [place]. pt was waiting on mail order and did not want to run out.

## 2012-11-27 ENCOUNTER — Other Ambulatory Visit: Payer: Self-pay | Admitting: Cardiovascular Disease

## 2012-12-30 ENCOUNTER — Other Ambulatory Visit: Payer: Self-pay | Admitting: Cardiovascular Disease

## 2013-02-13 ENCOUNTER — Other Ambulatory Visit: Payer: Self-pay

## 2013-04-09 ENCOUNTER — Other Ambulatory Visit: Payer: Self-pay | Admitting: Cardiovascular Disease

## 2013-04-10 ENCOUNTER — Other Ambulatory Visit: Payer: Self-pay | Admitting: Cardiovascular Disease

## 2013-05-16 ENCOUNTER — Other Ambulatory Visit: Payer: Self-pay

## 2013-06-05 ENCOUNTER — Telehealth: Payer: Self-pay

## 2013-06-05 NOTE — Telephone Encounter (Signed)
Phone call to patient letting him know he is due for his Prevnar vaccine. He agreed and was transferred to scheduling for an appt.

## 2013-06-12 ENCOUNTER — Ambulatory Visit: Payer: Medicare Other

## 2013-06-19 ENCOUNTER — Ambulatory Visit (INDEPENDENT_AMBULATORY_CARE_PROVIDER_SITE_OTHER): Payer: Medicare Other

## 2013-06-19 DIAGNOSIS — Z23 Encounter for immunization: Secondary | ICD-10-CM

## 2013-07-05 ENCOUNTER — Ambulatory Visit (INDEPENDENT_AMBULATORY_CARE_PROVIDER_SITE_OTHER): Payer: Medicare Other | Admitting: Internal Medicine

## 2013-07-05 ENCOUNTER — Encounter: Payer: Self-pay | Admitting: Internal Medicine

## 2013-07-05 ENCOUNTER — Ambulatory Visit (INDEPENDENT_AMBULATORY_CARE_PROVIDER_SITE_OTHER)
Admission: RE | Admit: 2013-07-05 | Discharge: 2013-07-05 | Disposition: A | Discharge status: Home / Self Care | Payer: Medicare Other | Source: Ambulatory Visit | Attending: Internal Medicine | Admitting: Internal Medicine

## 2013-07-05 VITALS — BP 128/72 | HR 75 | Temp 97.8°F | Resp 16 | Wt 194.0 lb

## 2013-07-05 DIAGNOSIS — R05 Cough: Secondary | ICD-10-CM

## 2013-07-05 DIAGNOSIS — R059 Cough, unspecified: Secondary | ICD-10-CM

## 2013-07-05 DIAGNOSIS — J209 Acute bronchitis, unspecified: Secondary | ICD-10-CM

## 2013-07-05 MED ORDER — AZITHROMYCIN 500 MG PO TABS: 500.0000 mg | ORAL_TABLET | Freq: Every day | ORAL | Status: DC

## 2013-07-05 MED ORDER — HYDROCODONE-HOMATROPINE 5-1.5 MG/5ML PO SYRP: 5.0000 mL | ORAL_SOLUTION | Freq: Three times a day (TID) | ORAL | Status: DC | PRN

## 2013-07-05 NOTE — Patient Instructions (Signed)
Acute Bronchitis Bronchitis is inflammation of the airways that extend from the windpipe into the lungs (bronchi). The inflammation often causes mucus to develop. This leads to a cough, which is the most common symptom of bronchitis.  In acute bronchitis, the condition usually develops suddenly and goes away over time, usually in a couple weeks. Smoking, allergies, and asthma can make bronchitis worse. Repeated episodes of bronchitis may cause further lung problems.  CAUSES Acute bronchitis is most often caused by the same virus that causes a cold. The virus can spread from person to person (contagious).  SIGNS AND SYMPTOMS   Cough.   Fever.   Coughing up mucus.   Body aches.   Chest congestion.   Chills.   Shortness of breath.   Sore throat.  DIAGNOSIS  Acute bronchitis is usually diagnosed through a physical exam. Tests, such as chest X-rays, are sometimes done to rule out other conditions.  TREATMENT  Acute bronchitis usually goes away in a couple weeks. Often times, no medical treatment is necessary. Medicines are sometimes given for relief of fever or cough. Antibiotics are usually not needed but may be prescribed in certain situations. In some cases, an inhaler may be recommended to help reduce shortness of breath and control the cough. A cool mist vaporizer may also be used to help thin bronchial secretions and make it easier to clear the chest.  HOME CARE INSTRUCTIONS  Get plenty of rest.   Drink enough fluids to keep your urine clear or pale yellow (unless you have a medical condition that requires fluid restriction). Increasing fluids may help thin your secretions and will prevent dehydration.   Only take over-the-counter or prescription medicines as directed by your health care provider.   Avoid smoking and secondhand smoke. Exposure to cigarette smoke or irritating chemicals will make bronchitis worse. If you are a smoker, consider using nicotine gum or skin  patches to help control withdrawal symptoms. Quitting smoking will help your lungs heal faster.   Reduce the chances of another bout of acute bronchitis by washing your hands frequently, avoiding people with cold symptoms, and trying not to touch your hands to your mouth, nose, or eyes.   Follow up with your health care provider as directed.  SEEK MEDICAL CARE IF: Your symptoms do not improve after 1 week of treatment.  SEEK IMMEDIATE MEDICAL CARE IF:  You develop an increased fever or chills.   You have chest pain.   You have severe shortness of breath.  You have bloody sputum.   You develop dehydration.  You develop fainting.  You develop repeated vomiting.  You develop a severe headache. MAKE SURE YOU:   Understand these instructions.  Will watch your condition.  Will get help right away if you are not doing well or get worse. Document Released: 08/04/2004 Document Revised: 02/27/2013 Document Reviewed: 12/18/2012 Exit[employer] Patient Information 2014 [employer].  

## 2013-07-05 NOTE — Assessment & Plan Note (Signed)
Will treat the infection with Zpak and will control the cough with hycodan cough

## 2013-07-05 NOTE — Progress Notes (Signed)
Pre-visit discussion using our clinic review tool. No additional management support is needed unless otherwise documented below in the visit note.  

## 2013-07-05 NOTE — Assessment & Plan Note (Signed)
His CXR is negative for PNA, mass, edema 

## 2013-07-05 NOTE — Progress Notes (Signed)
   Subjective:    Patient ID: [name], male    DOB: 08/20/1934, 78 y.o.   MRN: [id number]  Cough This is a new problem. The current episode started in the past 7 days. The problem has been unchanged. The problem occurs every few hours. The cough is productive of purulent sputum. Associated symptoms include chills, postnasal drip and a sore throat. Pertinent negatives include no chest pain, ear congestion, fever, heartburn, hemoptysis, myalgias, nasal congestion, rash, rhinorrhea, shortness of breath, sweats, weight loss or wheezing. He has tried nothing for the symptoms. The treatment provided no relief. There is no history of asthma, bronchiectasis, bronchitis, COPD, emphysema, environmental allergies or pneumonia.      Review of Systems  Constitutional: Positive for chills. Negative for fever, weight loss, diaphoresis, activity change, appetite change, fatigue and unexpected weight change.  HENT: Positive for postnasal drip and sore throat. Negative for facial swelling, rhinorrhea, sinus pressure and trouble swallowing.   Eyes: Negative.   Respiratory: Negative.  Negative for cough, hemoptysis, choking, chest tightness, shortness of breath, wheezing and stridor.   Cardiovascular: Negative.  Negative for chest pain, palpitations and leg swelling.  Gastrointestinal: Negative.  Negative for heartburn, nausea, vomiting, abdominal pain, diarrhea and constipation.  Endocrine: Negative.   Genitourinary: Negative.   Musculoskeletal: Negative.  Negative for myalgias.  Skin: Negative.  Negative for rash.  Allergic/Immunologic: Negative.  Negative for environmental allergies.  Neurological: Negative.   Hematological: Negative.  Negative for adenopathy. Does not bruise/bleed easily.  Psychiatric/Behavioral: Negative.        Objective:   Physical Exam  Vitals reviewed. Constitutional: He is oriented to person, place, and time. He appears well-developed and well-nourished.  Non-toxic  appearance. He does not have a sickly appearance. He does not appear ill. No distress.  HENT:  Head: Normocephalic and atraumatic.  Right Ear: Hearing, tympanic membrane, external ear and ear canal normal. No decreased hearing is noted.  Left Ear: Hearing, tympanic membrane, external ear and ear canal normal.  Nose: Nose normal. No mucosal edema or rhinorrhea. Right sinus exhibits no maxillary sinus tenderness and no frontal sinus tenderness. Left sinus exhibits no maxillary sinus tenderness and no frontal sinus tenderness.  Mouth/Throat: Oropharynx is clear and moist and mucous membranes are normal. Mucous membranes are not pale, not dry and not cyanotic. No oral lesions. No trismus in the jaw. No uvula swelling. No oropharyngeal exudate, posterior oropharyngeal edema, posterior oropharyngeal erythema or tonsillar abscesses.  Eyes: Conjunctivae are normal. Right eye exhibits no discharge. Left eye exhibits no discharge. No scleral icterus.  Neck: Normal range of motion. Neck supple. No JVD present. No tracheal deviation present. No thyromegaly present.  Cardiovascular: Normal rate, regular rhythm, normal heart sounds and intact distal pulses.  Exam reveals no gallop and no friction rub.   No murmur heard. Pulmonary/Chest: Effort normal and breath sounds normal. No stridor. No respiratory distress. He has no wheezes. He has no rales. He exhibits no tenderness.  Abdominal: Soft. Bowel sounds are normal. He exhibits no distension and no mass. There is no tenderness. There is no rebound and no guarding.  Musculoskeletal: Normal range of motion. He exhibits no edema and no tenderness.  Lymphadenopathy:    He has no cervical adenopathy.  Neurological: He is oriented to person, place, and time.  Skin: Skin is warm and dry. No rash noted. He is not diaphoretic. No erythema. No pallor.          Assessment & Plan:

## 2013-08-09 ENCOUNTER — Other Ambulatory Visit: Payer: Self-pay | Admitting: *Deleted

## 2013-08-09 MED ORDER — ATENOLOL-CHLORTHALIDONE 50-25 MG PO TABS: 1.0000 | ORAL_TABLET | Freq: Every day | ORAL | Status: DC

## 2013-08-09 MED ORDER — PRAVASTATIN SODIUM 20 MG PO TABS: ORAL_TABLET | ORAL | Status: DC

## 2013-08-09 MED ORDER — ISOSORBIDE MONONITRATE ER 60 MG PO TB24: ORAL_TABLET | ORAL | Status: DC

## 2013-08-09 MED ORDER — POTASSIUM CHLORIDE CRYS ER 20 MEQ PO TBCR: EXTENDED_RELEASE_TABLET | ORAL | Status: DC

## 2013-11-13 ENCOUNTER — Telehealth: Payer: Self-pay | Admitting: Family Medicine

## 2013-11-13 ENCOUNTER — Ambulatory Visit (INDEPENDENT_AMBULATORY_CARE_PROVIDER_SITE_OTHER): Payer: Medicare HMO | Admitting: Family Medicine

## 2013-11-13 ENCOUNTER — Encounter: Payer: Self-pay | Admitting: Family Medicine

## 2013-11-13 VITALS — BP 110/66 | HR 67 | Temp 98.2°F | Ht 68.5 in | Wt 192.0 lb

## 2013-11-13 DIAGNOSIS — I2510 Atherosclerotic heart disease of native coronary artery without angina pectoris: Secondary | ICD-10-CM

## 2013-11-13 DIAGNOSIS — R200 Anesthesia of skin: Secondary | ICD-10-CM

## 2013-11-13 DIAGNOSIS — E785 Hyperlipidemia, unspecified: Secondary | ICD-10-CM

## 2013-11-13 DIAGNOSIS — E119 Type 2 diabetes mellitus without complications: Secondary | ICD-10-CM

## 2013-11-13 DIAGNOSIS — I10 Essential (primary) hypertension: Secondary | ICD-10-CM

## 2013-11-13 DIAGNOSIS — R209 Unspecified disturbances of skin sensation: Secondary | ICD-10-CM

## 2013-11-13 MED ORDER — FLUTICASONE PROPIONATE 50 MCG/ACT NA SUSP: 2.0000 | Freq: Every day | NASAL | Status: DC

## 2013-11-13 NOTE — Progress Notes (Signed)
   Subjective:    Patient ID: [name], male    DOB: 1935/05/28, 79 y.o.   MRN: [id number]  HPI 79 year old male presents with to establish.  Previously seen by Dr, Linda Hedges.   Has not had  Physical/medicare wellness in a long time.   Followed by Dr, Glennie Hawk every 6-12 months for CAD, s/p stent.  Daughter being treated for colon cancer at [facility]. He is going to be traveling to [place] a lot lately.  Last time he was in [place] he started having sinus issue 2 weeks ago. Nasal congestion, post nasal drip. No fever. No sinus pain.  Right ear mildly painful and draining. Symptoms improved but now right ear is completely stopped.  no cough, no SOB.  High chol  Almost at goal < 70 on prvastatin Lab Results  Component Value Date   CHOL 148 04/19/2012   HDL 33.70* 04/19/2012   LDLCALC 90 04/19/2012   TRIG 120.0 04/19/2012   CHOLHDL 4 04/19/2012    HTN well controlled on atenolol, chlorthalidone  BP Readings from Last 3 Encounters:  11/13/13 110/66  07/05/13 128/72  10/01/12 130/80   DM, not on any medication. Occ checking FBS 135 Lab Results  Component Value Date   HGBA1C 7.2* 07/25/2012   Has not been exercising much in winter, better with this in warm weather.  Has noted numbness/ tingling on B sole of  Feet ongoing x 6- months.  Review of Systems  Constitutional: Negative for fever and fatigue.  HENT: Negative for ear pain.   Eyes: Negative for pain.  Respiratory: Negative for chest tightness and shortness of breath.   Cardiovascular: Negative for chest pain, palpitations and leg swelling.  Gastrointestinal: Negative for abdominal pain, diarrhea and constipation.  Genitourinary: Negative for dysuria.       Objective:   Physical Exam        Assessment & Plan:

## 2013-11-13 NOTE — Patient Instructions (Addendum)
Schedule fasting labs and medicare wellness in next few weeks. Start nasal steroid spray 2 sprays per nostril daily for ear symptoms.

## 2013-11-13 NOTE — Progress Notes (Signed)
Pre visit review using our clinic review tool, if applicable. No additional management support is needed unless otherwise documented below in the visit note. 

## 2013-11-13 NOTE — Telephone Encounter (Signed)
Relevant patient education assigned to patient using Emmi. ° °

## 2013-11-19 ENCOUNTER — Telehealth: Payer: Self-pay

## 2013-11-19 NOTE — Telephone Encounter (Signed)
Pt called [facility] did not have flonase rx; advised flonase went to [facility]; pt said he has not received yet; Estill Bamberg at [employer] said if she runs thru local [facility]; pt would have to pay for mail order med and she suggested this time pt getting Flonase OTC ( average cost depending on size and whether on sale or not is $12 - $23.00). Pt voiced understanding.

## 2013-11-20 ENCOUNTER — Telehealth: Payer: Self-pay

## 2013-11-20 ENCOUNTER — Other Ambulatory Visit (INDEPENDENT_AMBULATORY_CARE_PROVIDER_SITE_OTHER): Payer: Medicare HMO

## 2013-11-20 DIAGNOSIS — E119 Type 2 diabetes mellitus without complications: Secondary | ICD-10-CM

## 2013-11-20 DIAGNOSIS — R209 Unspecified disturbances of skin sensation: Secondary | ICD-10-CM

## 2013-11-20 DIAGNOSIS — E785 Hyperlipidemia, unspecified: Secondary | ICD-10-CM

## 2013-11-20 DIAGNOSIS — I10 Essential (primary) hypertension: Secondary | ICD-10-CM

## 2013-11-20 DIAGNOSIS — R200 Anesthesia of skin: Secondary | ICD-10-CM

## 2013-11-20 LAB — LIPID PANEL
Cholesterol: 142 mg/dL (ref 0–200)
HDL: 34 mg/dL — AB (ref >39.00)
LDL Cholesterol: 86 mg/dL (ref 0–99)
TRIGLYCERIDES: 112 mg/dL (ref 0.0–149.0)
Total CHOL/HDL Ratio: 4
VLDL: 22.4 mg/dL (ref 0.0–40.0)

## 2013-11-20 LAB — COMPREHENSIVE METABOLIC PANEL
ALT: 19 U/L (ref 0–53)
AST: 24 U/L (ref 0–37)
Albumin: 3.9 g/dL (ref 3.5–5.2)
Alkaline Phosphatase: 47 U/L (ref 39–117)
BILIRUBIN TOTAL: 0.4 mg/dL (ref 0.2–1.2)
BUN: 20 mg/dL (ref 6–23)
CO2: 28 meq/L (ref 19–32)
CREATININE: 1 mg/dL (ref 0.4–1.5)
Calcium: 9.2 mg/dL (ref 8.4–10.5)
Chloride: 106 mEq/L (ref 96–112)
GFR: 75.29 mL/min (ref >60.00)
GLUCOSE: 131 mg/dL — AB (ref 70–99)
Potassium: 3.4 mEq/L — ABNORMAL LOW (ref 3.5–5.1)
SODIUM: 141 meq/L (ref 135–145)
TOTAL PROTEIN: 6.7 g/dL (ref 6.0–8.3)

## 2013-11-20 LAB — VITAMIN B12: Vitamin B-12: 288 pg/mL (ref 211–911)

## 2013-11-20 LAB — HEMOGLOBIN A1C: HEMOGLOBIN A1C: 7.4 % — AB (ref 4.6–6.5)

## 2013-11-20 LAB — TSH: TSH: 1.36 u[IU]/mL (ref 0.35–4.50)

## 2013-11-20 NOTE — Telephone Encounter (Signed)
Relevant patient education assigned to patient using Emmi. ° °

## 2013-11-26 NOTE — Assessment & Plan Note (Signed)
May be due to DM eval with labs for other secondary causes.

## 2013-11-26 NOTE — Assessment & Plan Note (Addendum)
Almost at goal  On current med at last check. Due for re-eval.

## 2013-11-26 NOTE — Assessment & Plan Note (Signed)
Due for re-eval. 

## 2013-11-26 NOTE — Assessment & Plan Note (Signed)
Well controlled. Continue current medication.  

## 2013-11-27 ENCOUNTER — Other Ambulatory Visit: Payer: Self-pay | Admitting: *Deleted

## 2013-11-27 MED ORDER — POTASSIUM CHLORIDE CRYS ER 20 MEQ PO TBCR: 40.0000 meq | EXTENDED_RELEASE_TABLET | Freq: Two times a day (BID) | ORAL | Status: DC

## 2013-11-27 MED ORDER — ISOSORBIDE MONONITRATE ER 60 MG PO TB24: ORAL_TABLET | ORAL | Status: DC

## 2013-11-27 MED ORDER — ATENOLOL-CHLORTHALIDONE 50-25 MG PO TABS: 1.0000 | ORAL_TABLET | Freq: Every day | ORAL | Status: DC

## 2013-11-28 ENCOUNTER — Encounter: Payer: Medicare HMO | Admitting: Family Medicine

## 2013-12-06 ENCOUNTER — Ambulatory Visit (INDEPENDENT_AMBULATORY_CARE_PROVIDER_SITE_OTHER): Payer: Medicare HMO | Admitting: Family Medicine

## 2013-12-06 ENCOUNTER — Encounter: Payer: Self-pay | Admitting: Family Medicine

## 2013-12-06 VITALS — BP 110/68 | HR 68 | Temp 98.0°F | Ht 66.38 in | Wt 186.2 lb

## 2013-12-06 DIAGNOSIS — E119 Type 2 diabetes mellitus without complications: Secondary | ICD-10-CM

## 2013-12-06 DIAGNOSIS — H9190 Unspecified hearing loss, unspecified ear: Secondary | ICD-10-CM

## 2013-12-06 DIAGNOSIS — G909 Disorder of the autonomic nervous system, unspecified: Secondary | ICD-10-CM

## 2013-12-06 DIAGNOSIS — E785 Hyperlipidemia, unspecified: Secondary | ICD-10-CM

## 2013-12-06 DIAGNOSIS — H9191 Unspecified hearing loss, right ear: Secondary | ICD-10-CM

## 2013-12-06 DIAGNOSIS — Z0000 Encounter for general adult medical examination without abnormal findings: Secondary | ICD-10-CM

## 2013-12-06 DIAGNOSIS — I10 Essential (primary) hypertension: Secondary | ICD-10-CM

## 2013-12-06 NOTE — Progress Notes (Signed)
Pre visit review using our clinic review tool, if applicable. No additional management support is needed unless otherwise documented below in the visit note. 

## 2013-12-06 NOTE — Assessment & Plan Note (Signed)
Not at goal LDL < 70 on pravastatin. Has not tolerated other statins in past. Work on lifestyle changes, recheck in 3 months.

## 2013-12-06 NOTE — Progress Notes (Signed)
Subjective:    Patient ID: [name], male    DOB: 1935-04-11, 79 y.o.   MRN: [id number]  HPI I have personally reviewed the Medicare Annual Wellness questionnaire and have noted 1. The patient's medical and social history 2. Their use of alcohol, tobacco or illicit drugs 3. Their current medications and supplements 4. The patient's functional ability including ADL's, fall risks, home safety risks and hearing or visual             impairment. 5. Diet and physical activities 6. Evidence for depression or mood disorders The patients weight, height, BMI and visual acuity have been recorded in the chart I have made referrals, counseling and provided education to the patient based review of the above and I have provided the pt with a written personalized care plan for preventive services.  He has been having right ear fullness, decreased hearing in right ear. He has fallen two times since last OV from being dizzy. Flonase has helped some with ear fullness and dizziness.   Followed by Dr, Glennie Hawk every 6-12 months for CAD, s/p stent.   High chol: Almost at goal < 70 on pravastatin  Lab Results  Component Value Date   CHOL 142 11/20/2013   HDL 34.00* 11/20/2013   LDLCALC 86 11/20/2013   TRIG 112.0 11/20/2013   CHOLHDL 4 11/20/2013   HTN well controlled on atenolol, chlorthalidone  BP Readings from Last 3 Encounters:  12/06/13 110/68  11/13/13 110/66  07/05/13 128/72    DM, inadequate control on no medication. Occ checking FBS 136 Lab Results  Component Value Date   HGBA1C 7.4* 11/20/2013  Exercising: some.  Numbness in B feet, ongoing for > 1 year, worse in last year: recent TSH and B12 in nml range.  His wife has noted he does not pick up feet as well anymore. Wife thinks he shuffles more. He feels legs are more weak. Low back pain ongoing off and on for years.  X-ray: 07/2012: IMPRESSION:  1. Stable grade 1 retrolisthesis of L1 on L2 and stable grade 1  anterolisthesis  of L4 on L5. These findings may be related to  chronic facet joint degenerative changes. No pars defects are  identified.  2. No significant acute bony abnormality or degenerative change  identified compared to lumbar spine radiographs of 05/18/2010. History   Social History  . Marital Status: Married    Spouse Name: N/A    Number of Children: N/A  . Years of Education: N/A   Social History Main Topics  . Smoking status: Former Research scientist (life sciences)  . Smokeless tobacco: Never Used     Comment: quit in 1985. smoked 1ppd for 35 years   . Alcohol Use: No  . Drug Use: No  . Sexual Activity: None   Other Topics Concern  . None   Social History Narrative   Married with children. 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 10:20 am    Has living will,  HCPOA: jerrie Tino, full code (reviewed 2015)     Review of Systems  All other systems reviewed and are negative.      Objective:   Physical Exam  Constitutional: He is oriented to person, place, and time. He appears well-developed and well-nourished.  Non-toxic appearance. He does not appear ill. No distress.  HENT:  Head: Normocephalic and atraumatic.  Right Ear: Hearing, tympanic membrane, external ear and ear canal normal. Tympanic membrane is not injected and not erythematous.  No middle ear effusion.  Left Ear: Hearing, external ear and ear canal normal. Tympanic membrane is not injected and not erythematous.  No middle ear effusion.  Nose: Nose normal.  Mouth/Throat: Uvula is midline, oropharynx is clear and moist and mucous membranes are normal.  Eyes: Conjunctivae, EOM and lids are normal. Pupils are equal, round, and reactive to light. Lids are everted and swept, no foreign bodies found.  Neck: Trachea normal, normal range of motion and phonation normal. Neck supple. Carotid bruit is not present. No mass and no thyromegaly present.  Cardiovascular: Normal rate, regular rhythm, S1 normal, S2  normal, intact distal pulses and normal pulses.  Exam reveals no gallop.   No murmur heard. Pulmonary/Chest: Breath sounds normal. He has no wheezes. He has no rhonchi. He has no rales.  Abdominal: Soft. Normal appearance and bowel sounds are normal. There is no hepatosplenomegaly. There is no tenderness. There is no rebound, no guarding and no CVA tenderness. No hernia.  Lymphadenopathy:    He has no cervical adenopathy.  Neurological: He is alert and oriented to person, place, and time. He has normal strength and normal reflexes. He displays no atrophy. No cranial nerve deficit or sensory deficit. Gait abnormal. Coordination normal.  Slowed gait, no shuffling, swinging arms  Skin: Skin is warm, dry and intact. No rash noted.  Psychiatric: He has a normal mood and affect. His speech is normal and behavior is normal. Judgment normal.          Assessment & Plan:  The patient's preventative maintenance and recommended screening tests for an annual wellness exam were reviewed in full today. Brought up to date unless services declined.  Counselled on the importance of diet, exercise, and its role in overall health and mortality. The patient's FH and SH was reviewed, including their home life, tobacco status, and drug and alcohol status.   Vaccines:Uptodate with PNA ? prevnar or pneumococcal, due for shingles and Tdap Prostate: not indicated Colon:2011,diverticulosis, on polyps, no further  Colonoscopy needed  Former smoker, > 35 pack year history, asymptomatic, consider spirometry and Chest CT at a later time for screening  Problem List Items Addressed This Visit   DIABETES MELLITUS     Inadeqaute control on no med. He wants to try diet and lifestyle cchanges first. If not at goal at next OV , will reconsider meds.    HYPERLIPIDEMIA     Not at goal LDL < 70 on pravastatin. Has not tolerated other statins in past. Work on lifestyle changes, recheck in 3 months.    HYPERTENSION     Well  controlled. Continue current medication.     Peripheral autonomic neuropathy   Relevant Orders      Ambulatory referral to Neurology   Decreased hearing of right ear   Relevant Orders      Ambulatory referral to ENT    Other Visit Diagnoses   Routine general medical examination at a health care facility    -  Primary

## 2013-12-06 NOTE — Assessment & Plan Note (Signed)
Inadeqaute control on no med. He wants to try diet and lifestyle cchanges first. If not at goal at next OV , will reconsider meds.

## 2013-12-06 NOTE — Assessment & Plan Note (Signed)
Well controlled. Continue current medication.  

## 2013-12-06 NOTE — Patient Instructions (Addendum)
Get back on track with low carb diet, work on exercise and weight loss. Decrease carbs and veggie fats in diet.  Follow up DM in 3 months with fasting labs prior. Look into shingles vaccine and tetanus coverage. Stop at front desk for  referrals for nerve conduction test.

## 2013-12-16 ENCOUNTER — Other Ambulatory Visit: Payer: Self-pay | Admitting: *Deleted

## 2013-12-16 DIAGNOSIS — G909 Disorder of the autonomic nervous system, unspecified: Secondary | ICD-10-CM

## 2014-01-07 ENCOUNTER — Encounter: Payer: Self-pay | Admitting: Cardiovascular Disease

## 2014-01-07 ENCOUNTER — Ambulatory Visit (INDEPENDENT_AMBULATORY_CARE_PROVIDER_SITE_OTHER): Payer: Medicare HMO | Admitting: Cardiovascular Disease

## 2014-01-07 VITALS — BP 100/70 | HR 58 | Ht 66.5 in | Wt 188.0 lb

## 2014-01-07 DIAGNOSIS — I2510 Atherosclerotic heart disease of native coronary artery without angina pectoris: Secondary | ICD-10-CM

## 2014-01-07 DIAGNOSIS — I10 Essential (primary) hypertension: Secondary | ICD-10-CM

## 2014-01-07 DIAGNOSIS — E785 Hyperlipidemia, unspecified: Secondary | ICD-10-CM

## 2014-01-07 NOTE — Progress Notes (Signed)
History of Present Illness: 79 yo WM with history of CAD s/p placement of drug eluting stent in mid LAD in January of 2005 with residual disease in proximal LAD and RCA, HTN, Hyperlipidemia and GERD who presents today for cardiac follow up. I saw him 07/20/11 and he had c/o daily chest pains. Cardiac cath 07/27/11 showed stable moderate CAD with patent LAD stent. He has not tolerated statins in the past secondary to myalgias.   He is here today for follow up. He has not been exercising. He does note chronic stable angina with chest pains with heavy exertion. Breathing has been stable.   Primary Care Physician: Diona Browner  Last Lipid Profile:Lipid Panel     Component Value Date/Time   CHOL 142 11/20/2013 0951   TRIG 112.0 11/20/2013 0951   HDL 34.00* 11/20/2013 0951   CHOLHDL 4 11/20/2013 0951   VLDL 22.4 11/20/2013 0951   LDLCALC 86 11/20/2013 0951     Past Medical History  Diagnosis Date  . Coronary atherosclerosis of unspecified type of vessel, native or graft     s/p stent mid LAD 2005  . DJD (degenerative joint disease)   . GERD (gastroesophageal reflux disease)   . DM (diabetes mellitus)   . HTN (hypertension)   . Hyperlipidemia     Past Surgical History  Procedure Laterality Date  . Ptca      Hx of it.   . Knee surgery    . Kidney stone removal    . Sigmoidoscopy    . Total knee arthroplasty  02/2011    Right knee    Current Outpatient Prescriptions  Medication Sig Dispense Refill  . aspirin EC 81 MG tablet Take 1 tablet (81 mg total) by mouth daily.  30 tablet  6  . atenolol-chlorthalidone (TENORETIC) 50-25 MG per tablet Take 1 tablet by mouth daily.  15 tablet  0  . Flaxseed, Linseed, (FLAX SEED OIL) 1000 MG CAPS Take 1 capsule by mouth 2 (two) times daily.        . fluticasone (FLONASE) 50 MCG/ACT nasal spray Place 2 sprays into both nostrils daily.  16 g  6  . isosorbide mononitrate (IMDUR) 60 MG 24 hr tablet TAKE 1 TABLET (60 MG TOTAL) BY MOUTH DAILY.  15 tablet  0    . naproxen sodium (ANAPROX) 220 MG tablet Take 220 mg by mouth daily as needed. 1-2 tablets  daily      . nitroGLYCERIN (NITROSTAT) 0.4 MG SL tablet Place 0.4 mg under the tongue every 5 (five) minutes as needed. For chest pain      . potassium chloride SA (K-DUR,KLOR-CON) 20 MEQ tablet Take 2 tablets (40 mEq total) by mouth 2 (two) times daily.  60 tablet  0  . pravastatin (PRAVACHOL) 20 MG tablet TAKE 1 TABLET BY MOUTH EVERY EVENING.  90 tablet  1   No current facility-administered medications for this visit.    Allergies  Allergen Reactions  . Statins Other (See Comments)    Caused soreness (Lipitor, Zocor)    History   Social History  . Marital Status: Married    Spouse Name: N/A    Number of Children: N/A  . Years of Education: N/A   Occupational History  . Not on file.   Social History Main Topics  . Smoking status: Former Research scientist (life sciences)  . Smokeless tobacco: Never Used     Comment: quit in 1985. smoked 1ppd for 35 years   . Alcohol Use: No  .  Drug Use: No  . Sexual Activity: Not on file   Other Topics Concern  . Not on file   Social History Narrative   Married with children. 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  Review of Systems:  As stated in the HPI and otherwise negative.   BP 100/70  Pulse 58  Ht 5' 6.5" (1.689 m)  Wt 188 lb (85.276 kg)  BMI 29.89 kg/m2  Physical Examination: General: Well developed, well nourished, NAD HEENT: OP clear, mucus membranes moist SKIN: warm, dry. No rashes. Neuro: No focal deficits Musculoskeletal: Muscle strength 5/5 all ext Psychiatric: Mood and affect normal Neck: No JVD, no carotid bruits, no thyromegaly, no lymphadenopathy. Lungs:Clear bilaterally, no wheezes, rhonci, crackles Cardiovascular: Regular rate and rhythm. No murmurs, gallops or rubs. Abdomen:Soft. Bowel  sounds present. Non-tender.  Extremities: No lower extremity edema. Pulses are 2 + in the bilateral DP/PT.  EKG: Sinus brady, rate 58 bpm.   Assessment and Plan:   1. CORONARY ARTERY DISEASE: Stable moderate non-obstructive disease by cath 07/27/11. Continue current therapy. He is tolerating low dose Pravastatin.  2. HTN: BP controlled. No changes.   3. HLD: Continue statin. Lipids well controlled.

## 2014-01-07 NOTE — Patient Instructions (Signed)
Your physician wants you to follow-up in:  12 months.  You will receive a reminder letter in the mail two months in advance. If you don't receive a letter, please call our office to schedule the follow-up appointment.   

## 2014-01-17 ENCOUNTER — Encounter (HOSPITAL_COMMUNITY): Payer: Self-pay | Admitting: Emergency Medicine

## 2014-01-17 ENCOUNTER — Inpatient Hospital Stay (HOSPITAL_COMMUNITY)
Admission: EM | Admit: 2014-01-17 | Discharge: 2014-01-24 | DRG: 026 | Disposition: A | Discharge status: Home / Self Care | Payer: Medicare HMO | Attending: Neurosurgery | Admitting: Neurosurgery

## 2014-01-17 DIAGNOSIS — E872 Acidosis, unspecified: Secondary | ICD-10-CM | POA: Diagnosis not present

## 2014-01-17 DIAGNOSIS — Z79899 Other long term (current) drug therapy: Secondary | ICD-10-CM

## 2014-01-17 DIAGNOSIS — W19XXXA Unspecified fall, initial encounter: Secondary | ICD-10-CM | POA: Diagnosis present

## 2014-01-17 DIAGNOSIS — R29898 Other symptoms and signs involving the musculoskeletal system: Secondary | ICD-10-CM | POA: Diagnosis present

## 2014-01-17 DIAGNOSIS — I2510 Atherosclerotic heart disease of native coronary artery without angina pectoris: Secondary | ICD-10-CM | POA: Diagnosis present

## 2014-01-17 DIAGNOSIS — R0789 Other chest pain: Secondary | ICD-10-CM | POA: Diagnosis not present

## 2014-01-17 DIAGNOSIS — Z9861 Coronary angioplasty status: Secondary | ICD-10-CM

## 2014-01-17 DIAGNOSIS — Z96659 Presence of unspecified artificial knee joint: Secondary | ICD-10-CM

## 2014-01-17 DIAGNOSIS — I6200 Nontraumatic subdural hemorrhage, unspecified: Secondary | ICD-10-CM | POA: Diagnosis not present

## 2014-01-17 DIAGNOSIS — S065XAA Traumatic subdural hemorrhage with loss of consciousness status unknown, initial encounter: Principal | ICD-10-CM | POA: Diagnosis present

## 2014-01-17 DIAGNOSIS — Z87891 Personal history of nicotine dependence: Secondary | ICD-10-CM

## 2014-01-17 DIAGNOSIS — G909 Disorder of the autonomic nervous system, unspecified: Secondary | ICD-10-CM | POA: Diagnosis present

## 2014-01-17 DIAGNOSIS — Z888 Allergy status to other drugs, medicaments and biological substances status: Secondary | ICD-10-CM

## 2014-01-17 DIAGNOSIS — H9190 Unspecified hearing loss, unspecified ear: Secondary | ICD-10-CM | POA: Diagnosis present

## 2014-01-17 DIAGNOSIS — S065X9A Traumatic subdural hemorrhage with loss of consciousness of unspecified duration, initial encounter: Secondary | ICD-10-CM

## 2014-01-17 DIAGNOSIS — E119 Type 2 diabetes mellitus without complications: Secondary | ICD-10-CM | POA: Diagnosis present

## 2014-01-17 DIAGNOSIS — I10 Essential (primary) hypertension: Secondary | ICD-10-CM | POA: Diagnosis present

## 2014-01-17 DIAGNOSIS — K219 Gastro-esophageal reflux disease without esophagitis: Secondary | ICD-10-CM | POA: Diagnosis present

## 2014-01-17 DIAGNOSIS — E785 Hyperlipidemia, unspecified: Secondary | ICD-10-CM | POA: Diagnosis present

## 2014-01-17 DIAGNOSIS — Z87442 Personal history of urinary calculi: Secondary | ICD-10-CM

## 2014-01-17 NOTE — ED Notes (Signed)
Left leg numbness. Hx. Of back problems. Getting more difficult to lift leg.

## 2014-01-18 ENCOUNTER — Encounter (HOSPITAL_COMMUNITY): Payer: Medicare HMO | Admitting: Anesthesiology

## 2014-01-18 ENCOUNTER — Inpatient Hospital Stay (HOSPITAL_COMMUNITY): Payer: Medicare HMO | Admitting: Anesthesiology

## 2014-01-18 ENCOUNTER — Emergency Department (HOSPITAL_COMMUNITY): Payer: Medicare HMO

## 2014-01-18 ENCOUNTER — Inpatient Hospital Stay (HOSPITAL_COMMUNITY): Payer: Medicare HMO

## 2014-01-18 ENCOUNTER — Encounter (HOSPITAL_COMMUNITY)
Admission: EM | Disposition: A | Discharge status: Home / Self Care | Payer: Self-pay | Source: Home / Self Care | Attending: Neurosurgery

## 2014-01-18 DIAGNOSIS — E119 Type 2 diabetes mellitus without complications: Secondary | ICD-10-CM | POA: Diagnosis present

## 2014-01-18 DIAGNOSIS — E785 Hyperlipidemia, unspecified: Secondary | ICD-10-CM | POA: Diagnosis present

## 2014-01-18 DIAGNOSIS — E872 Acidosis, unspecified: Secondary | ICD-10-CM | POA: Diagnosis not present

## 2014-01-18 DIAGNOSIS — I10 Essential (primary) hypertension: Secondary | ICD-10-CM | POA: Diagnosis present

## 2014-01-18 DIAGNOSIS — Z96659 Presence of unspecified artificial knee joint: Secondary | ICD-10-CM | POA: Diagnosis not present

## 2014-01-18 DIAGNOSIS — R29898 Other symptoms and signs involving the musculoskeletal system: Secondary | ICD-10-CM | POA: Diagnosis present

## 2014-01-18 DIAGNOSIS — H9190 Unspecified hearing loss, unspecified ear: Secondary | ICD-10-CM | POA: Diagnosis present

## 2014-01-18 DIAGNOSIS — Z87891 Personal history of nicotine dependence: Secondary | ICD-10-CM | POA: Diagnosis not present

## 2014-01-18 DIAGNOSIS — I2510 Atherosclerotic heart disease of native coronary artery without angina pectoris: Secondary | ICD-10-CM | POA: Diagnosis present

## 2014-01-18 DIAGNOSIS — Z9861 Coronary angioplasty status: Secondary | ICD-10-CM | POA: Diagnosis not present

## 2014-01-18 DIAGNOSIS — K219 Gastro-esophageal reflux disease without esophagitis: Secondary | ICD-10-CM | POA: Diagnosis present

## 2014-01-18 DIAGNOSIS — Z888 Allergy status to other drugs, medicaments and biological substances status: Secondary | ICD-10-CM | POA: Diagnosis not present

## 2014-01-18 DIAGNOSIS — I6200 Nontraumatic subdural hemorrhage, unspecified: Secondary | ICD-10-CM | POA: Diagnosis present

## 2014-01-18 DIAGNOSIS — G909 Disorder of the autonomic nervous system, unspecified: Secondary | ICD-10-CM | POA: Diagnosis present

## 2014-01-18 DIAGNOSIS — W19XXXA Unspecified fall, initial encounter: Secondary | ICD-10-CM | POA: Diagnosis present

## 2014-01-18 DIAGNOSIS — R0789 Other chest pain: Secondary | ICD-10-CM | POA: Diagnosis not present

## 2014-01-18 DIAGNOSIS — S065XAA Traumatic subdural hemorrhage with loss of consciousness status unknown, initial encounter: Secondary | ICD-10-CM | POA: Diagnosis present

## 2014-01-18 DIAGNOSIS — Z87442 Personal history of urinary calculi: Secondary | ICD-10-CM | POA: Diagnosis not present

## 2014-01-18 DIAGNOSIS — Z79899 Other long term (current) drug therapy: Secondary | ICD-10-CM | POA: Diagnosis not present

## 2014-01-18 DIAGNOSIS — S065X9A Traumatic subdural hemorrhage with loss of consciousness of unspecified duration, initial encounter: Secondary | ICD-10-CM | POA: Diagnosis present

## 2014-01-18 HISTORY — PX: CRANIOTOMY: SHX93

## 2014-01-18 LAB — CBC WITH DIFFERENTIAL/PLATELET
BASOS PCT: 0 % (ref 0–1)
Basophils Absolute: 0 10*3/uL (ref 0.0–0.1)
Basophils Absolute: 0 10*3/uL (ref 0.0–0.1)
Basophils Relative: 0 % (ref 0–1)
Eosinophils Absolute: 0.4 10*3/uL (ref 0.0–0.7)
Eosinophils Absolute: 0 10*3/uL (ref 0.0–0.7)
Eosinophils Relative: 5 % (ref 0–5)
Eosinophils Relative: 0 % (ref 0–5)
HCT: 37.7 % — ABNORMAL LOW (ref 39.0–52.0)
HCT: 39.2 % (ref 39.0–52.0)
Hemoglobin: 12.5 g/dL — ABNORMAL LOW (ref 13.0–17.0)
Hemoglobin: 13 g/dL (ref 13.0–17.0)
Lymphocytes Relative: 30 % (ref 12–46)
Lymphocytes Relative: 11 % — ABNORMAL LOW (ref 12–46)
Lymphs Abs: 2.6 10*3/uL (ref 0.7–4.0)
Lymphs Abs: 1.7 10*3/uL (ref 0.7–4.0)
MCH: 27.3 pg (ref 26.0–34.0)
MCH: 27.2 pg (ref 26.0–34.0)
MCHC: 33.2 g/dL (ref 30.0–36.0)
MCHC: 33.2 g/dL (ref 30.0–36.0)
MCV: 82.3 fL (ref 78.0–100.0)
MCV: 82 fL (ref 78.0–100.0)
Monocytes Absolute: 0.8 10*3/uL (ref 0.1–1.0)
Monocytes Absolute: 0.7 10*3/uL (ref 0.1–1.0)
Monocytes Relative: 9 % (ref 3–12)
Monocytes Relative: 5 % (ref 3–12)
NEUTROS PCT: 56 % (ref 43–77)
Neutro Abs: 4.9 10*3/uL (ref 1.7–7.7)
Neutro Abs: 12.4 10*3/uL — ABNORMAL HIGH (ref 1.7–7.7)
Neutrophils Relative %: 84 % — ABNORMAL HIGH (ref 43–77)
PLATELETS: 223 10*3/uL (ref 150–400)
Platelets: 239 10*3/uL (ref 150–400)
RBC: 4.58 MIL/uL (ref 4.22–5.81)
RBC: 4.78 MIL/uL (ref 4.22–5.81)
RDW: 15.2 % (ref 11.5–15.5)
RDW: 15.1 % (ref 11.5–15.5)
WBC: 8.7 10*3/uL (ref 4.0–10.5)
WBC: 14.9 10*3/uL — ABNORMAL HIGH (ref 4.0–10.5)

## 2014-01-18 LAB — BASIC METABOLIC PANEL
ANION GAP: 18 — AB (ref 5–15)
Anion gap: 20 — ABNORMAL HIGH (ref 5–15)
BUN: 21 mg/dL (ref 6–23)
BUN: 14 mg/dL (ref 6–23)
CO2: 22 mEq/L (ref 19–32)
CO2: 18 mEq/L — ABNORMAL LOW (ref 19–32)
Calcium: 9.6 mg/dL (ref 8.4–10.5)
Calcium: 8.4 mg/dL (ref 8.4–10.5)
Chloride: 103 mEq/L (ref 96–112)
Chloride: 101 mEq/L (ref 96–112)
Creatinine, Ser: 0.8 mg/dL (ref 0.50–1.35)
Creatinine, Ser: 0.66 mg/dL (ref 0.50–1.35)
GFR calc Af Amer: >90 mL/min (ref >90)
GFR calc non Af Amer: >90 mL/min (ref >90)
GFR, EST NON AFRICAN AMERICAN: 84 mL/min — AB (ref >90)
Glucose, Bld: 108 mg/dL — ABNORMAL HIGH (ref 70–99)
Glucose, Bld: 176 mg/dL — ABNORMAL HIGH (ref 70–99)
POTASSIUM: 3.5 meq/L — AB (ref 3.7–5.3)
Potassium: 3.8 mEq/L (ref 3.7–5.3)
SODIUM: 143 meq/L (ref 137–147)
Sodium: 139 mEq/L (ref 137–147)

## 2014-01-18 LAB — TYPE AND SCREEN
ABO/RH(D): O POS
ANTIBODY SCREEN: NEGATIVE

## 2014-01-18 LAB — ABO/RH: ABO/RH(D): O POS

## 2014-01-18 LAB — TROPONIN I: Troponin I: <0.3 ng/mL (ref <0.30)

## 2014-01-18 LAB — MRSA PCR SCREENING: MRSA by PCR: NEGATIVE

## 2014-01-18 LAB — PROTIME-INR
INR: 0.96 (ref 0.00–1.49)
Prothrombin Time: 12.8 seconds (ref 11.6–15.2)

## 2014-01-18 LAB — CK TOTAL AND CKMB (NOT AT ARMC)
CK, MB: 16.5 ng/mL — AB (ref 0.3–4.0)
RELATIVE INDEX: 4.1 — AB (ref 0.0–2.5)
Total CK: 400 U/L — ABNORMAL HIGH (ref 7–232)

## 2014-01-18 LAB — GLUCOSE, CAPILLARY
Glucose-Capillary: 92 mg/dL (ref 70–99)
Glucose-Capillary: 148 mg/dL — ABNORMAL HIGH (ref 70–99)
Glucose-Capillary: 128 mg/dL — ABNORMAL HIGH (ref 70–99)

## 2014-01-18 SURGERY — CRANIOTOMY HEMATOMA EVACUATION SUBDURAL
Anesthesia: General

## 2014-01-18 MED ORDER — ONDANSETRON HCL 4 MG/2ML IJ SOLN
INTRAMUSCULAR | Status: AC
  Filled 2014-01-18: qty 2

## 2014-01-18 MED ORDER — FLUTICASONE PROPIONATE 50 MCG/ACT NA SUSP
2.0000 | Freq: Every day | NASAL | Status: DC
  Administered 2014-01-19 – 2014-01-23 (×5): 2 via NASAL
  Filled 2014-01-18 (×4): qty 16

## 2014-01-18 MED ORDER — SODIUM CHLORIDE 0.9 % IV SOLN
INTRAVENOUS | Status: DC | PRN
  Administered 2014-01-18 (×2): via INTRAVENOUS

## 2014-01-18 MED ORDER — LABETALOL HCL 5 MG/ML IV SOLN
INTRAVENOUS | Status: DC | PRN
  Administered 2014-01-18 (×2): 5 mg via INTRAVENOUS
  Administered 2014-01-18: 10 mg via INTRAVENOUS

## 2014-01-18 MED ORDER — PROMETHAZINE HCL 25 MG/ML IJ SOLN
6.2500 mg | INTRAMUSCULAR | Status: DC | PRN
Start: 2014-01-18 — End: 2014-01-18
  Administered 2014-01-18: 6.25 mg via INTRAVENOUS

## 2014-01-18 MED ORDER — NITROGLYCERIN IN D5W 200-5 MCG/ML-% IV SOLN
2.0000 ug/min | INTRAVENOUS | Status: DC
  Filled 2014-01-18: qty 250

## 2014-01-18 MED ORDER — LEVETIRACETAM IN NACL 500 MG/100ML IV SOLN
500.0000 mg | Freq: Two times a day (BID) | INTRAVENOUS | Status: DC
  Administered 2014-01-18 – 2014-01-20 (×4): 500 mg via INTRAVENOUS
  Filled 2014-01-18 (×5): qty 100

## 2014-01-18 MED ORDER — LEVETIRACETAM IN NACL 1000 MG/100ML IV SOLN: 1000.0000 mg | INTRAVENOUS | Status: DC

## 2014-01-18 MED ORDER — HYDRALAZINE HCL 20 MG/ML IJ SOLN
5.0000 mg | INTRAMUSCULAR | Status: DC | PRN
  Administered 2014-01-18 (×3): 10 mg via INTRAVENOUS
  Filled 2014-01-18 (×3): qty 1

## 2014-01-18 MED ORDER — ONDANSETRON HCL 4 MG/2ML IJ SOLN
4.0000 mg | INTRAMUSCULAR | Status: DC | PRN
  Administered 2014-01-18 – 2014-01-20 (×5): 4 mg via INTRAVENOUS
  Filled 2014-01-18 (×5): qty 2

## 2014-01-18 MED ORDER — NEOSTIGMINE METHYLSULFATE 10 MG/10ML IV SOLN
INTRAVENOUS | Status: DC | PRN
  Administered 2014-01-18: 2 mg via INTRAVENOUS

## 2014-01-18 MED ORDER — PROPOFOL 10 MG/ML IV BOLUS
INTRAVENOUS | Status: AC
  Filled 2014-01-18: qty 20

## 2014-01-18 MED ORDER — CHLORTHALIDONE 25 MG PO TABS
25.0000 mg | ORAL_TABLET | Freq: Every day | ORAL | Status: DC
  Administered 2014-01-19 – 2014-01-24 (×5): 25 mg via ORAL
  Filled 2014-01-18 (×7): qty 1

## 2014-01-18 MED ORDER — OXYCODONE HCL 5 MG PO TABS
ORAL_TABLET | ORAL | Status: AC
  Filled 2014-01-18: qty 1

## 2014-01-18 MED ORDER — BISACODYL 10 MG RE SUPP
10.0000 mg | Freq: Every day | RECTAL | Status: DC | PRN
  Administered 2014-01-22: 10 mg via RECTAL
  Filled 2014-01-18 (×2): qty 1

## 2014-01-18 MED ORDER — OXYCODONE HCL 5 MG PO TABS
5.0000 mg | ORAL_TABLET | Freq: Once | ORAL | Status: AC | PRN
  Administered 2014-01-18: 5 mg via ORAL

## 2014-01-18 MED ORDER — LIDOCAINE HCL (CARDIAC) 20 MG/ML IV SOLN
INTRAVENOUS | Status: DC | PRN
  Administered 2014-01-18: 60 mg via INTRAVENOUS

## 2014-01-18 MED ORDER — LIDOCAINE-EPINEPHRINE 1 %-1:100000 IJ SOLN
INTRAMUSCULAR | Status: DC | PRN
Start: 2014-01-18 — End: 2014-01-18

## 2014-01-18 MED ORDER — ATENOLOL-CHLORTHALIDONE 50-25 MG PO TABS: 1.0000 | ORAL_TABLET | Freq: Every day | ORAL | Status: DC

## 2014-01-18 MED ORDER — OXYCODONE HCL 5 MG/5ML PO SOLN: 5.0000 mg | Freq: Once | ORAL | Status: AC | PRN

## 2014-01-18 MED ORDER — HYDROMORPHONE HCL PF 1 MG/ML IJ SOLN
INTRAMUSCULAR | Status: AC
  Administered 2014-01-18: 0.5 mg via INTRAVENOUS
  Filled 2014-01-18: qty 1

## 2014-01-18 MED ORDER — POTASSIUM CHLORIDE IN NACL 40-0.9 MEQ/L-% IV SOLN
INTRAVENOUS | Status: DC
  Administered 2014-01-18 – 2014-01-21 (×3): 100 mL/h via INTRAVENOUS
  Administered 2014-01-22: 50 mL/h via INTRAVENOUS
  Filled 2014-01-18 (×9): qty 1000

## 2014-01-18 MED ORDER — THROMBIN 20000 UNITS EX SOLR
CUTANEOUS | Status: DC | PRN
  Administered 2014-01-18: 12:00:00 via TOPICAL

## 2014-01-18 MED ORDER — LEVETIRACETAM IN NACL 1000 MG/100ML IV SOLN
1000.0000 mg | Freq: Once | INTRAVENOUS | Status: DC
  Administered 2014-01-18: 1000 mg via INTRAVENOUS

## 2014-01-18 MED ORDER — NITROGLYCERIN 0.4 MG SL SUBL
0.4000 mg | SUBLINGUAL_TABLET | SUBLINGUAL | Status: DC | PRN
  Administered 2014-01-18 (×2): 0.4 mg via SUBLINGUAL
  Filled 2014-01-18: qty 1

## 2014-01-18 MED ORDER — HYDROMORPHONE HCL PF 1 MG/ML IJ SOLN
0.2500 mg | INTRAMUSCULAR | Status: DC | PRN
  Administered 2014-01-18: 0.5 mg via INTRAVENOUS

## 2014-01-18 MED ORDER — LABETALOL HCL 5 MG/ML IV SOLN
INTRAVENOUS | Status: AC
  Filled 2014-01-18: qty 4

## 2014-01-18 MED ORDER — POTASSIUM CHLORIDE IN NACL 40-0.9 MEQ/L-% IV SOLN
INTRAVENOUS | Status: DC
  Administered 2014-01-18 – 2014-01-20 (×5): 100 mL/h via INTRAVENOUS
  Filled 2014-01-18 (×9): qty 1000

## 2014-01-18 MED ORDER — ONDANSETRON HCL 4 MG PO TABS
4.0000 mg | ORAL_TABLET | ORAL | Status: DC | PRN
  Administered 2014-01-19: 4 mg via ORAL
  Filled 2014-01-18: qty 1

## 2014-01-18 MED ORDER — LEVETIRACETAM IN NACL 1000 MG/100ML IV SOLN
1000.0000 mg | INTRAVENOUS | Status: DC
  Filled 2014-01-18: qty 100

## 2014-01-18 MED ORDER — MAGNESIUM HYDROXIDE 400 MG/5ML PO SUSP
30.0000 mL | Freq: Every day | ORAL | Status: DC | PRN
  Administered 2014-01-21: 30 mL via ORAL
  Filled 2014-01-18 (×2): qty 30

## 2014-01-18 MED ORDER — ROCURONIUM BROMIDE 100 MG/10ML IV SOLN
INTRAVENOUS | Status: DC | PRN
  Administered 2014-01-18: 50 mg via INTRAVENOUS

## 2014-01-18 MED ORDER — DEXTROSE 5 % IV SOLN
2.0000 g | INTRAVENOUS | Status: DC
  Filled 2014-01-18: qty 2

## 2014-01-18 MED ORDER — FENTANYL CITRATE 0.05 MG/ML IJ SOLN
INTRAMUSCULAR | Status: DC | PRN
  Administered 2014-01-18: 100 ug via INTRAVENOUS
  Administered 2014-01-18: 50 ug via INTRAVENOUS

## 2014-01-18 MED ORDER — ROCURONIUM BROMIDE 50 MG/5ML IV SOLN
INTRAVENOUS | Status: AC
  Filled 2014-01-18: qty 1

## 2014-01-18 MED ORDER — SODIUM CHLORIDE 0.9 % IV SOLN
INTRAVENOUS | Status: DC | PRN
  Administered 2014-01-18: 11:00:00 via INTRAVENOUS

## 2014-01-18 MED ORDER — MORPHINE SULFATE 2 MG/ML IJ SOLN
1.0000 mg | INTRAMUSCULAR | Status: DC | PRN
  Administered 2014-01-18 – 2014-01-19 (×4): 2 mg via INTRAVENOUS
  Administered 2014-01-19 (×2): 1 mg via INTRAVENOUS
  Administered 2014-01-19 – 2014-01-20 (×3): 2 mg via INTRAVENOUS
  Filled 2014-01-18 (×9): qty 1

## 2014-01-18 MED ORDER — ISOSORBIDE MONONITRATE ER 60 MG PO TB24
60.0000 mg | ORAL_TABLET | Freq: Every day | ORAL | Status: DC
  Administered 2014-01-19 – 2014-01-21 (×3): 60 mg via ORAL
  Filled 2014-01-18 (×6): qty 1

## 2014-01-18 MED ORDER — LIDOCAINE HCL (CARDIAC) 20 MG/ML IV SOLN
INTRAVENOUS | Status: AC
  Filled 2014-01-18: qty 5

## 2014-01-18 MED ORDER — SODIUM CHLORIDE 0.9 % IV SOLN
1000.0000 mg | INTRAVENOUS | Status: DC
  Filled 2014-01-18: qty 10

## 2014-01-18 MED ORDER — ATENOLOL 25 MG PO TABS
50.0000 mg | ORAL_TABLET | Freq: Every day | ORAL | Status: DC
  Administered 2014-01-19 – 2014-01-24 (×5): 50 mg via ORAL
  Filled 2014-01-18 (×3): qty 1
  Filled 2014-01-18 (×2): qty 2
  Filled 2014-01-18: qty 1
  Filled 2014-01-18: qty 2

## 2014-01-18 MED ORDER — POTASSIUM CHLORIDE CRYS ER 20 MEQ PO TBCR
40.0000 meq | EXTENDED_RELEASE_TABLET | Freq: Two times a day (BID) | ORAL | Status: DC
  Administered 2014-01-19 – 2014-01-24 (×10): 40 meq via ORAL
  Filled 2014-01-18 (×12): qty 2

## 2014-01-18 MED ORDER — PROPOFOL 10 MG/ML IV BOLUS
INTRAVENOUS | Status: DC | PRN
  Administered 2014-01-18: 140 mg via INTRAVENOUS

## 2014-01-18 MED ORDER — PANTOPRAZOLE SODIUM 40 MG IV SOLR
40.0000 mg | Freq: Every day | INTRAVENOUS | Status: DC
  Administered 2014-01-18: 40 mg via INTRAVENOUS
  Filled 2014-01-18: qty 40

## 2014-01-18 MED ORDER — GLYCOPYRROLATE 0.2 MG/ML IJ SOLN
INTRAMUSCULAR | Status: DC | PRN
  Administered 2014-01-18: 0.4 mg via INTRAVENOUS

## 2014-01-18 MED ORDER — ONDANSETRON HCL 4 MG/2ML IJ SOLN
INTRAMUSCULAR | Status: DC | PRN
  Administered 2014-01-18: 4 mg via INTRAVENOUS

## 2014-01-18 MED ORDER — DEXTROSE 5 % IV SOLN
2.0000 g | INTRAVENOUS | Status: DC | PRN
  Administered 2014-01-18: 2 g via INTRAVENOUS

## 2014-01-18 MED ORDER — BUPIVACAINE HCL 0.25 % IJ SOLN
INTRAMUSCULAR | Status: DC | PRN
  Administered 2014-01-18: 20 mL

## 2014-01-18 MED ORDER — NEOSTIGMINE METHYLSULFATE 10 MG/10ML IV SOLN
INTRAVENOUS | Status: AC
  Filled 2014-01-18: qty 1

## 2014-01-18 MED ORDER — 0.9 % SODIUM CHLORIDE (POUR BTL) OPTIME
TOPICAL | Status: DC | PRN
  Administered 2014-01-18: 1000 mL

## 2014-01-18 MED ORDER — FENTANYL CITRATE 0.05 MG/ML IJ SOLN
INTRAMUSCULAR | Status: AC
  Filled 2014-01-18: qty 5

## 2014-01-18 MED ORDER — PROMETHAZINE HCL 25 MG/ML IJ SOLN
INTRAMUSCULAR | Status: AC
  Administered 2014-01-18: 6.25 mg via INTRAVENOUS
  Filled 2014-01-18: qty 1

## 2014-01-18 MED ORDER — LEVETIRACETAM 500 MG/5ML IV SOLN
1000.0000 mg | INTRAVENOUS | Status: DC
  Filled 2014-01-18: qty 10

## 2014-01-18 MED ORDER — LABETALOL HCL 5 MG/ML IV SOLN: 5.0000 mg | INTRAVENOUS | Status: DC | PRN

## 2014-01-18 MED ORDER — GLYCOPYRROLATE 0.2 MG/ML IJ SOLN
INTRAMUSCULAR | Status: AC
  Filled 2014-01-18: qty 2

## 2014-01-18 MED ORDER — PHENYLEPHRINE HCL 10 MG/ML IJ SOLN
10.0000 mg | INTRAVENOUS | Status: DC | PRN
  Administered 2014-01-18: 40 ug/min via INTRAVENOUS

## 2014-01-18 MED ORDER — METOPROLOL TARTRATE 1 MG/ML IV SOLN
5.0000 mg | Freq: Once | INTRAVENOUS | Status: AC
  Administered 2014-01-18: 5 mg via INTRAVENOUS
  Filled 2014-01-18: qty 5

## 2014-01-18 SURGICAL SUPPLY — 36 items
BLADE 10 SAFETY STRL DISP (BLADE) ×2 IMPLANT
BLADE CLIPPER SURG NEURO (BLADE) ×2 IMPLANT
BNDG GAUZE ELAST 4 BULKY (GAUZE/BANDAGES/DRESSINGS) ×2 IMPLANT
BNDG GAUZE STRTCH 6 (GAUZE/BANDAGES/DRESSINGS) ×2 IMPLANT
BRUSH SCRUB EZ 1% IODOPHOR (MISCELLANEOUS) ×2 IMPLANT
BUR ACORN 6.0 PRECISION (BURR) ×2 IMPLANT
CANISTER SUCT 3000ML (MISCELLANEOUS) ×4 IMPLANT
CONT SPEC 4OZ CLIKSEAL STRL BL (MISCELLANEOUS) ×4 IMPLANT
CORDS BIPOLAR (ELECTRODE) ×2 IMPLANT
DRAPE NEUROLOGICAL W/INCISE (DRAPES) ×2 IMPLANT
DRAPE WARM FLUID 44X44 (DRAPE) ×2 IMPLANT
DRSG ADAPTIC 3X8 NADH LF (GAUZE/BANDAGES/DRESSINGS) ×2 IMPLANT
DRSG OPSITE POSTOP 4X8 (GAUZE/BANDAGES/DRESSINGS) ×2 IMPLANT
ELECT CAUTERY BLADE 6.4 (BLADE) ×2 IMPLANT
ELECT REM PT RETURN 9FT ADLT (ELECTROSURGICAL) ×2
ELECTRODE REM PT RTRN 9FT ADLT (ELECTROSURGICAL) ×1 IMPLANT
EVACUATOR 1/8 PVC DRAIN (DRAIN) ×1 IMPLANT
EVACUATOR SILICONE 100CC (DRAIN) ×2 IMPLANT
GOWN STRL NON-REIN LRG LVL3 (GOWN DISPOSABLE) ×4 IMPLANT
KIT BASIN OR (CUSTOM PROCEDURE TRAY) ×2 IMPLANT
KIT ROOM TURNOVER OR (KITS) ×3 IMPLANT
NS IRRIG 1000ML POUR BTL (IV SOLUTION) ×6 IMPLANT
PACK CRANIOTOMY (CUSTOM PROCEDURE TRAY) ×2 IMPLANT
PAD ARMBOARD 7.5X6 YLW CONV (MISCELLANEOUS) ×6 IMPLANT
PLATE 1.5  2HOLE MED NEURO (Plate) ×1 IMPLANT
PLATE 1.5 2HOLE MED NEURO (Plate) ×1 IMPLANT
PLATE 1.5 5HOLE SQUARE (Plate) ×2 IMPLANT
SCREW SELF DRILL HT 1.5/4MM (Screw) ×20 IMPLANT
SPONGE GAUZE 4X4 12PLY STER LF (GAUZE/BANDAGES/DRESSINGS) ×2 IMPLANT
SPONGE SURGIFOAM ABS GEL 100 (HEMOSTASIS) IMPLANT
STAPLER SKIN PROX WIDE 3.9 (STAPLE) ×2 IMPLANT
SUT NURALON 4 0 TR CR/8 (SUTURE) ×6 IMPLANT
SYR CONTROL 10ML LL (SYRINGE) ×2 IMPLANT
TOWEL OR 17X24 6PK STRL BLUE (TOWEL DISPOSABLE) ×1 IMPLANT
TOWEL OR 17X26 10 PK STRL BLUE (TOWEL DISPOSABLE) ×2 IMPLANT
TRAY FOLEY CATH 14FRSI W/METER (CATHETERS) ×2 IMPLANT

## 2014-01-18 NOTE — Progress Notes (Signed)
Filed Vitals:   01/18/14 0600 01/18/14 0700 01/18/14 0800 01/18/14 0820  BP: 136/67 120/64 156/89   Pulse: 70 62 69   Temp:    97.4 F (36.3 C)  TempSrc:    Oral  Resp: '19 21 11   ' Height:      Weight:      SpO2: 95% 94% 97%     CBC  Recent Labs  01/18/14 0054  WBC 8.7  HGB 12.5*  HCT 37.7*  PLT 223   BMET  Recent Labs  01/18/14 0054  NA 143  K 3.5*  CL 103  CO2 22  GLUCOSE 108*  BUN 21  CREATININE 0.80  CALCIUM 9.6    Met with the patient, his wife, and his son and discussed his condition, reviewed his CT scan with them, and discussed our recommendation for craniotomy for evacuation of subdural hematoma. I discussed nature of surgery, typical of surgery, hospital stay, and recuperation, and risks of surgery including risks of infection, bleeding, possibly for transfusion, the risk of increased neurologic dysfunction, with paralysis, coma, and/or death, the risk of recurrence of the subdural hematoma and possible need for reoperation, and anesthetic risks of myocardial function, stroke, pneumonia, and death. After discussing this with the patient and his family, and answering their questions, the patient and his family wish for [place] to proceed with surgery.  Plan: Craniotomy for evacuation of subdural hematoma. Posted with operating room.  Hosie Spangle, MD 01/18/2014, 9:44 AM

## 2014-01-18 NOTE — Progress Notes (Signed)
[facility] Progress Note Patient Name: [name] DOB: 1934/11/07 MRN: [id number]  Date of Service  01/18/2014   HPI/Events of Note  Discussed case with NSG.  Will attempt to avoid aspirin and anticoag.  Will discuss any initiation with NSG.  Chest pain improved with NTG and bp control.   eICU Interventions  Trend cardiac enzymes bp control ntg prn      LENNARD, CAPEK, P 01/18/2014, 10:59 PM

## 2014-01-18 NOTE — Anesthesia Preprocedure Evaluation (Addendum)
Anesthesia Evaluation  Patient identified by MRN, date of birth, ID band Patient awake    Reviewed: Allergy & Precautions, H&P , NPO status , Patient's Chart, lab work & pertinent test results, reviewed documented beta blocker date and time   Airway Mallampati: II TM Distance: >3 FB Neck ROM: Full    Dental  (+) Edentulous Upper, Partial Lower, Dental Advisory Given   Pulmonary former smoker,    Pulmonary exam normal       Cardiovascular hypertension, Pt. on medications and Pt. on home beta blockers + CAD  Cardiac Cath 07/27/2011:  Impression: 1. Moderate CAD 2. Patent stent mid LAD with moderate restenosis 3. Normal LV systolic  function      Neuro/Psych negative psych ROS   GI/Hepatic Neg liver ROS, GERD-  Medicated and Controlled,  Endo/Other  diabetes  Renal/GU negative Renal ROS     Musculoskeletal   Abdominal   Peds  Hematology   Anesthesia Other Findings   Reproductive/Obstetrics                         Anesthesia Physical Anesthesia Plan  ASA: III and emergent  Anesthesia Plan: General   Post-op Pain Management:    Induction: Intravenous  Airway Management Planned: Oral ETT  Additional Equipment: Arterial line  Intra-op Plan:   Post-operative Plan: Possible Post-op intubation/ventilation  Informed Consent: I have reviewed the patients History and Physical, chart, labs and discussed the procedure including the risks, benefits and alternatives for the proposed anesthesia with the patient or authorized representative who has indicated his/her understanding and acceptance.   Dental advisory given  Plan Discussed with: CRNA, Anesthesiologist and Surgeon  Anesthesia Plan Comments:         Anesthesia Quick Evaluation

## 2014-01-18 NOTE — Anesthesia Postprocedure Evaluation (Signed)
Anesthesia Post Note  Patient: [name]  Procedure(s) Performed: Procedure(s) (LRB): CRANIOTOMY HEMATOMA EVACUATION SUBDURAL (N/A)  Anesthesia type: general  Patient location: PACU  Post pain: Pain level controlled  Post assessment: Patient's Cardiovascular Status Stable  Last Vitals:  Filed Vitals:   01/18/14 1900  BP: 119/49  Pulse: 69  Temp:   Resp: 17    Post vital signs: Reviewed and stable  Level of consciousness: sedated  Complications: No apparent anesthesia complications

## 2014-01-18 NOTE — Transfer of Care (Signed)
Immediate Anesthesia Transfer of Care Note  Patient: [name]  Procedure(s) Performed: Procedure(s): CRANIOTOMY HEMATOMA EVACUATION SUBDURAL (N/A)  Patient Location: PACU  Anesthesia Type:General  Level of Consciousness: awake  Airway & Oxygen Therapy: Patient Spontanous Breathing and Patient connected to nasal cannula oxygen  Post-op Assessment: Report given to PACU RN and Post -op Vital signs reviewed and stable  Post vital signs: Reviewed and stable  Complications: No apparent anesthesia complications

## 2014-01-18 NOTE — ED Notes (Addendum)
Pt. Ambulated to restroom with one staff assist and a cane. Pt. Reports difficulty with left leg, shuffle with left leg, unable to pick it up as high as right.

## 2014-01-18 NOTE — Progress Notes (Signed)
eLink Physician-Brief Progress Note Patient Name: [name] DOB: 1934-08-23 MRN: [id number]  Date of Service  01/18/2014   HPI/Events of Note  Patient with history of CAD admitted with SDH.  Underwent evacuation today.  Now with severe chest pain and lateral ST depression.  Pain improved with NTG x2.     eICU Interventions  Lopressor 5 mg IV x 1 Cardiac enzymes  Neurosurgery to contact elink to discuss treatment  Will need cardiology consult   Intervention Category Intermediate Interventions: Other:  [name], [name], [name] 01/18/2014, 10:24 PM

## 2014-01-18 NOTE — Op Note (Signed)
01/17/2014 - 01/18/2014  1:35 PM  PATIENT:  [name]  79 y.o. male  PRE-OPERATIVE DIAGNOSIS:  right hemispheric subacute subdural hematoma  POST-OPERATIVE DIAGNOSIS:  right hemispheric subacute subdural hematoma  PROCEDURE:  Procedure(s):   Right frontal parietal craniotomy and evacuation of subdural hematoma  SURGEON:  Surgeon(s): Hosie Spangle, MD  ANESTHESIA:   general  EBL:  Total I/O In: 1100 [I.V.:1100] Out: 550 [Urine:500; Blood:50]  BLOOD ADMINISTERED:none  COUNT: Correct per nursing staff  DRAINS:  Jackson-Pratt drain, placed in the subdural space, connected to a closed bulb collection system  DICTATION: Patient the operating room, placed under general endotracheal anesthesia. Patient scalp was shaved with electric clippers, and the head was positioned in a gel doughnut head rest. The scalp was prepped with Betadine soap and solution and draped in a sterile fashion. A right parasagittal incision was made from the right frontal boss to the right parietal boss. The line of the incision was infiltrated with local site with epinephrine. Raney culture by discomfort his maintain hemostasis. The scalp was elevated and retracted with self-retaining retractors. A single burr hole was made, the dura dissected from the overlying skull. Using the craniotome attachment bone flap was turned and then carefully elevated. The dura was tacked up around the margins of the craniotomy with 4-0 Nurolon suture. The dura was opened in a U-shaped fashion and hinged towards the midline. We immediately encountered the underlying parietal subdural membrane. This was opened and tacked up to the edges of the dura with medium hemoclips. There is mixed liquid and coagulated chronic subdural hematoma there was gently irrigated away with warm saline. We then gently elevated the visceral subdural membrane of the brain surface with saline irrigation, and the visceral subdural membrane was tacked up to the  edges of the dura with medium hemoclips. The subdural membranes were coagulated as necessary to establish hemostasis. The subdural space was irrigated extensively with warm saline until clear. We then placed a medium Hemovac drain in the subdural space, it was brought out through a separate stab incision, and collected to a closed bulb collection system. The dura was approximated with interrupted undyed 4-0 Nurolon sutures. Gelfoam was placed around the margins of the craniotomy. A 4-0 Nurolon suture was placed in the midportion of the dura, and tacked up to the midportion of the bone flap. The bone flap was secured to the skull with 2 square and 1 medium straight Lorenz cranial plates. The scalp was closed in layers. The galea was closed with interrupted inverted 2-0 undyed Vicryl suture. The skin is approximate surgical staples. There was dressed with Adaptic and sterile gauze, and wrapped with 2 Curlex and then 2 Kling. Following surgery the patient is to be reversed from the anesthetic, extubated if feasible, and transferred to the recovery room for further care.  PLAN OF CARE: Admit to inpatient   PATIENT DISPOSITION:  PACU - hemodynamically stable.   Delay start of Pharmacological VTE agent (>24hrs) due to surgical blood loss or risk of bleeding:  yes

## 2014-01-18 NOTE — H&P (Signed)
Subjective: Patient is a 79 y.o. male who is admitted for treatment and management of a right hemispheric subdural hematoma. He presented to the emergency room with worsening intermittent weakness and clumsiness on the left side. He was evaluated by Dr Linton Flemings (EDP) with a CT of the brain without contrast revealed a moderately large right hemispheric subacute subdural hematoma, with moderate mass effect on the right hemisphere. History is obtained both the patient and his wife, and explained that he had a couple of falls in May, when he apparently had an episode of dizziness that was attributed to vertigo by his primary physician. Patient notes that for the past 2 days has been having some mild frontal headache. Patient is admitted for further evaluation and treatment.   Patient Active Problem List   Diagnosis Date Noted  . Subdural hematoma 01/18/2014  . Peripheral autonomic neuropathy 12/06/2013  . Decreased hearing of right ear 12/06/2013  . Numbness in feet 11/13/2013  . DIABETES MELLITUS 11/26/2007  . HYPERLIPIDEMIA 11/26/2007  . HYPERTENSION 11/26/2007  . CORONARY ARTERY DISEASE 11/26/2007  . GERD 11/26/2007  . DEGENERATIVE JOINT DISEASE 11/26/2007  . PERCUTANEOUS TRANSLUMINAL CORONARY ANGIOPLASTY, HX OF 11/26/2007   Past Medical History  Diagnosis Date  . Coronary atherosclerosis of unspecified type of vessel, native or graft     s/p stent mid LAD 2005  . DJD (degenerative joint disease)   . GERD (gastroesophageal reflux disease)   . DM (diabetes mellitus)   . HTN (hypertension)   . Hyperlipidemia     Past Surgical History  Procedure Laterality Date  . Ptca      Hx of it.   . Knee surgery    . Kidney stone removal    . Sigmoidoscopy    . Total knee arthroplasty  02/2011    Right knee    Prescriptions prior to admission  Medication Sig Dispense Refill  . aspirin EC 81 MG tablet Take 1 tablet (81 mg total) by mouth daily.  30 tablet  6  . atenolol-chlorthalidone  (TENORETIC) 50-25 MG per tablet Take 1 tablet by mouth daily.  15 tablet  0  . Flaxseed, Linseed, (FLAX SEED OIL) 1000 MG CAPS Take 1 capsule by mouth 2 (two) times daily.        . fluticasone (FLONASE) 50 MCG/ACT nasal spray Place 2 sprays into both nostrils daily.  16 g  6  . isosorbide mononitrate (IMDUR) 60 MG 24 hr tablet Take 60 mg by mouth every morning.      . naproxen sodium (ANAPROX) 220 MG tablet Take 440 mg by mouth every morning.       . nitroGLYCERIN (NITROSTAT) 0.4 MG SL tablet Place 0.4 mg under the tongue every 5 (five) minutes as needed. For chest pain      . potassium chloride SA (K-DUR,KLOR-CON) 20 MEQ tablet Take 2 tablets (40 mEq total) by mouth 2 (two) times daily.  60 tablet  0  . pravastatin (PRAVACHOL) 20 MG tablet Take 20 mg by mouth every evening.       Allergies  Allergen Reactions  . Statins Other (See Comments)    Caused soreness (Lipitor, Zocor)    History  Substance Use Topics  . Smoking status: Former Research scientist (life sciences)  . Smokeless tobacco: Never Used     Comment: quit in 1985. smoked 1ppd for 35 years   . Alcohol Use: No    History reviewed. No pertinent family history.   Review of Systems A comprehensive review of systems  was negative.  Objective: Vital signs in last 24 hours: Temp:  [97.4 F (36.3 C)-98.3 F (36.8 C)] 97.4 F (36.3 C) (07/11 0820) Pulse Rate:  [57-70] 62 (07/11 0700) Resp:  [10-21] 21 (07/11 0700) BP: (120-166)/(58-90) 120/64 mmHg (07/11 0700) SpO2:  [94 %-98 %] 94 % (07/11 0700) Weight:  [86.3 kg (190 lb 4.1 oz)] 86.3 kg (190 lb 4.1 oz) (07/11 0515)  EXAM: Patient is a well-developed well-nourished white male in no acute distress.  Lungs are clear to auscultation , the patient has symmetrical respiratory excursion. Heart has a regular rate and rhythm normal S1 and S2 no murmur.   Abdomen is soft nontender nondistended bowel sounds are present. Extremity examination shows no clubbing cyanosis or edema. Mental status examination shows  the patient is awake, alert, oriented to name, [facility] hospital, ICU, and July 2015. Speech is fluent, he has good comprehension. He follows commands.  Cranial nerve examination shows pupils are equal, round, reactive to light, and about 3 mm bilaterally. EOMI. Facial sensation intact. Facial movements symmetrical. Hearing present bilaterally. Palatal movement is symmetrical. Shoulder shrug symmetrical. Tongue is midline. Motor examination shows 5/5 strength in the upper and lower extremities. There is no drift of the upper extremities. Sensory examination is intact to pinprick throughout. Reflexes are symmetrical.  Data Review:CBC    Component Value Date/Time   WBC 8.7 01/18/2014 0054   RBC 4.58 01/18/2014 0054   HGB 12.5* 01/18/2014 0054   HCT 37.7* 01/18/2014 0054   PLT 223 01/18/2014 0054   MCV 82.3 01/18/2014 0054   MCH 27.3 01/18/2014 0054   MCHC 33.2 01/18/2014 0054   RDW 15.2 01/18/2014 0054   LYMPHSABS 2.6 01/18/2014 0054   MONOABS 0.8 01/18/2014 0054   EOSABS 0.4 01/18/2014 0054   BASOSABS 0.0 01/18/2014 0054                          BMET    Component Value Date/Time   NA 143 01/18/2014 0054   K 3.5* 01/18/2014 0054   CL 103 01/18/2014 0054   CO2 22 01/18/2014 0054   GLUCOSE 108* 01/18/2014 0054   BUN 21 01/18/2014 0054   CREATININE 0.80 01/18/2014 0054   CALCIUM 9.6 01/18/2014 0054   GFRNONAA 84* 01/18/2014 0054   GFRAA >90 01/18/2014 0054     Assessment/Plan: Patient with worsening intermittent weakness and clumsiness on the left side. CT the brain reveals a moderately large right hemispheric subacute subdural hematoma. I spoke with patient at his bedside and his wife by phone. I anticipate the patient will require craniotomy for evacuation of the subdural hematoma, but I've asked the patient's wife to come back to the hospital so that we can review the imaging together and discuss recommendations, including for surgery, and risks of surgery. She explained that is going to bring her  son with her. In the meantime the patient will be kept n.p.o., supported with IV fluids, SCDs have been requested.   Hosie Spangle, MD 01/18/2014 8:25 AM

## 2014-01-18 NOTE — ED Provider Notes (Signed)
CSN: [id number]     Arrival date & time 01/17/14  2014 History   First MD Initiated Contact with Patient 01/17/14 2356     Chief Complaint  Patient presents with  . Numbness     (Consider location/radiation/quality/duration/timing/severity/associated sxs/prior Treatment) HPI 79 year old male presents to the emergency department from home with complaints of left-sided weakness and clumsiness.  He and his wife give a history of worsening weakness and clammy and clumsiness of the left arm and leg last night.  Throughout the day today, they report that at times he has been unable to use his left arm and leg having difficulties dressing himself and walking, getting in and out of car.  Patient has had ongoing numbness and paresthesias to the bottom of his feet for some time.  He is scheduled to followup with neurology for peripheral nerve conduction study in August.  Patient has chronic low back pain after a fall several years ago.  No change in his back pain.  No bowel or bladder incontinence.  No fever or chills.  Patient and wife report that while in the waiting room waiting to be seen, he had improvement of his symptoms and now can use his left arm and leg as normal.  Patient reports that earlier today his left arm and leg were like dead weight.  He had difficulties lifting the arm and leg.  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  He reports he was unable to grasp.  No facial weakness drooping or numbness.  No dysarthria.  No previous history of same.  Patient has history of coronary disease, diabetes hypertension hyperlipidemia. Past Medical History  Diagnosis Date  . Coronary atherosclerosis of unspecified type of vessel, native or graft     s/p stent mid LAD 2005  . DJD (degenerative joint disease)   . GERD (gastroesophageal reflux disease)   . DM (diabetes mellitus)   . HTN (hypertension)   . Hyperlipidemia    Past Surgical History  Procedure Laterality  Date  . Ptca      Hx of it.   . Knee surgery    . Kidney stone removal    . Sigmoidoscopy    . Total knee arthroplasty  02/2011    Right knee   History reviewed. No pertinent family history. History  Substance Use Topics  . Smoking status: Former Research scientist (life sciences)  . Smokeless tobacco: Never Used     Comment: quit in 1985. smoked 1ppd for 35 years   . Alcohol Use: No    Review of Systems  See History of Present Illness; otherwise all other systems are reviewed and negative   Allergies  Statins  Home Medications   Prior to Admission medications   Medication Sig Start Date End Date Taking? Authorizing Provider  aspirin EC 81 MG tablet Take 1 tablet (81 mg total) by mouth daily. 10/01/12   Burnell Blanks, MD  atenolol-chlorthalidone (TENORETIC) 50-25 MG per tablet Take 1 tablet by mouth daily. 11/27/13   Burnell Blanks, MD  Flaxseed, Linseed, (FLAX SEED OIL) 1000 MG CAPS Take 1 capsule by mouth 2 (two) times daily.      Historical Provider, MD  fluticasone (FLONASE) 50 MCG/ACT nasal spray Place 2 sprays into both nostrils daily. 11/13/13   Amy Cletis Athens, MD  isosorbide mononitrate (IMDUR) 60 MG 24 hr tablet TAKE 1 TABLET (60 MG TOTAL) BY MOUTH DAILY. 11/27/13   Burnell Blanks, MD  naproxen sodium (  ANAPROX) 220 MG tablet Take 220 mg by mouth daily as needed. 1-2 tablets  daily    Historical Provider, MD  nitroGLYCERIN (NITROSTAT) 0.4 MG SL tablet Place 0.4 mg under the tongue every 5 (five) minutes as needed. For chest pain    Historical Provider, MD  potassium chloride SA (K-DUR,KLOR-CON) 20 MEQ tablet Take 2 tablets (40 mEq total) by mouth 2 (two) times daily. 11/27/13   Burnell Blanks, MD  pravastatin (PRAVACHOL) 20 MG tablet TAKE 1 TABLET BY MOUTH EVERY EVENING. 08/09/13   Burnell Blanks, MD   BP 147/58  Pulse 62  Temp(Src) 97.7 F (36.5 C) (Oral)  Resp 18  SpO2 97% Physical Exam  Nursing note and vitals reviewed. Constitutional: He is oriented to  person, place, and time. He appears well-developed and well-nourished.  HENT:  Head: Normocephalic and atraumatic.  Left Ear: External ear normal.  Nose: Nose normal.  Mouth/Throat: Oropharynx is clear and moist.  Patient with chronic perforation of right TM  Eyes: Conjunctivae and EOM are normal. Pupils are equal, round, and reactive to light.  Neck: Normal range of motion. Neck supple. No JVD present. No tracheal deviation present. No thyromegaly present.  Cardiovascular: Normal rate, regular rhythm, normal heart sounds and intact distal pulses.  Exam reveals no gallop and no friction rub.   No murmur heard. Pulmonary/Chest: Effort normal and breath sounds normal. No stridor. No respiratory distress. He has no wheezes. He has no rales. He exhibits no tenderness.  Abdominal: Soft. Bowel sounds are normal. He exhibits no distension and no mass. There is no tenderness. There is no rebound and no guarding.  Musculoskeletal: Normal range of motion. He exhibits no edema and no tenderness.  Lymphadenopathy:    He has no cervical adenopathy.  Neurological: He is alert and oriented to person, place, and time. He has normal reflexes. No cranial nerve deficit. He exhibits normal muscle tone. Coordination normal.  Skin: Skin is warm and dry. No rash noted. No erythema. No pallor.  Psychiatric: He has a normal mood and affect. His behavior is normal. Judgment and thought content normal.    ED Course  Procedures (including critical care time) Labs Review Labs Reviewed  CBC WITH DIFFERENTIAL - Abnormal; Notable for the following:    Hemoglobin 12.5 (*)    HCT 37.7 (*)    All other components within normal limits  BASIC METABOLIC PANEL - Abnormal; Notable for the following:    Potassium 3.5 (*)    Glucose, Bld 108 (*)    GFR calc non Af Amer 84 (*)    Anion gap 18 (*)    All other components within normal limits  PROTIME-INR    Imaging Review Ct Head Wo Contrast  01/18/2014   CLINICAL DATA:   Fall, occipital head trauma 7-10 days ago. Left-sided weakness.  EXAM: CT HEAD WITHOUT CONTRAST  TECHNIQUE: Contiguous axial images were obtained from the base of the skull through the vertex without intravenous contrast.  COMPARISON:  No similar prior exam is available at this institution for comparison or on [facility].  FINDINGS: There is a mixed attenuation acute to subacute right subdural hematoma measuring 1.9 cm maximally over the lateral convexity image 21. Adjacent sulcal edema is noted with mild possible mass effect upon the right lateral ventricle. 7 mm leftward midline shift is identified. No mass or infarct is identified. Mild sinusitis is noted. Orbits are grossly intact. No skull fracture.  IMPRESSION: Mixed density acute to subacute large right  subdural hematoma causing mass effect due to edema and 7 mm leftward midline shift.  Critical Value/emergent results were called by telephone at the time of interpretation on 01/18/2014 at 2:26 AM to Dr. Linton Flemings , who verbally acknowledged these results.   Electronically Signed   By: Conchita Paris M.D.   On: 01/18/2014 02:28     EKG Interpretation   Date/Time:  Friday January 17 2014 20:23:41 EDT Ventricular Rate:  62 PR Interval:  166 QRS Duration: 88 QT Interval:  412 QTC Calculation: 418 R Axis:   -33 Text Interpretation:  Normal sinus rhythm Left axis deviation Abnormal ECG  No significant change since last tracing Confirmed by Jackey Housey  MD, Amaiah Cristiano  ([id number]) on 01/18/2014 12:32:03 AM     CRITICAL CARE Performed by: Kalman Drape Total critical care time: 60 min Critical care time was exclusive of separately billable procedures and treating other patients. Critical care was necessary to treat or prevent imminent or life-threatening deterioration. Critical care was time spent personally by me on the following activities: development of treatment plan with patient and/or surrogate as well as nursing, discussions with consultants, evaluation  of patient's response to treatment, examination of patient, obtaining history from patient or surrogate, ordering and performing treatments and interventions, ordering and review of laboratory studies, ordering and review of radiographic studies, pulse oximetry and re-evaluation of patient's condition.  MDM   Final diagnoses:  Subdural hematoma    79 year old male with what sounds to be a TIA.  Symptoms have resolved at this time.  Plan for CT of head, labs.  Will discuss with hospitalist for admission for TIA workup.  2:46 AM Right-sided subdural with 7 mm of shift to the left.  In discussing with patient and family, patient reports that he had 2 falls where he struck his head in may.  Patient that time had sinus infection and drainage from his right ear.  He reports that since starting the Flonase, his balance has improved.  He denies any recent falls or head trauma. Patient is not on any blood thinners..  He denies any headaches.  Patient and family updated on findings.  Plan for consult to neurosurgery.  3:00 AM Case d/w Dr Sherwood Gambler with neurosurgery, plan for admission to neuro ICU  Kalman Drape, MD 01/18/14 0300

## 2014-01-18 NOTE — ED Notes (Signed)
Pt. Reports weakness to left arm and leg. Per wife, it started yesterday and got "a lot" worse today, "he couldn't even get dressed". Pt. Able to move all extremities well, equal strength, no drift noted. Pt. Also reports feeling "off balance". Recently treated for a ear infection.

## 2014-01-18 NOTE — Progress Notes (Signed)
Pt transferred to OR holding by OR staff, accompanied by wife and son. Report called to [facility].

## 2014-01-19 ENCOUNTER — Inpatient Hospital Stay (HOSPITAL_COMMUNITY): Payer: Medicare HMO

## 2014-01-19 DIAGNOSIS — K219 Gastro-esophageal reflux disease without esophagitis: Secondary | ICD-10-CM

## 2014-01-19 DIAGNOSIS — I6200 Nontraumatic subdural hemorrhage, unspecified: Secondary | ICD-10-CM

## 2014-01-19 LAB — GLUCOSE, CAPILLARY
Glucose-Capillary: 163 mg/dL — ABNORMAL HIGH (ref 70–99)
Glucose-Capillary: 173 mg/dL — ABNORMAL HIGH (ref 70–99)
Glucose-Capillary: 126 mg/dL — ABNORMAL HIGH (ref 70–99)
Glucose-Capillary: 155 mg/dL — ABNORMAL HIGH (ref 70–99)
Glucose-Capillary: 135 mg/dL — ABNORMAL HIGH (ref 70–99)
Glucose-Capillary: 150 mg/dL — ABNORMAL HIGH (ref 70–99)
Glucose-Capillary: 135 mg/dL — ABNORMAL HIGH (ref 70–99)

## 2014-01-19 LAB — BASIC METABOLIC PANEL
Anion gap: 17 — ABNORMAL HIGH (ref 5–15)
BUN: 14 mg/dL (ref 6–23)
CO2: 22 mEq/L (ref 19–32)
Calcium: 8.3 mg/dL — ABNORMAL LOW (ref 8.4–10.5)
Chloride: 103 mEq/L (ref 96–112)
Creatinine, Ser: 0.76 mg/dL (ref 0.50–1.35)
GFR calc Af Amer: >90 mL/min (ref >90)
GFR calc non Af Amer: 86 mL/min — ABNORMAL LOW (ref >90)
Glucose, Bld: 155 mg/dL — ABNORMAL HIGH (ref 70–99)
Potassium: 3.7 mEq/L (ref 3.7–5.3)
Sodium: 142 mEq/L (ref 137–147)

## 2014-01-19 LAB — CBC WITH DIFFERENTIAL/PLATELET
Basophils Absolute: 0 10*3/uL (ref 0.0–0.1)
Basophils Relative: 0 % (ref 0–1)
Eosinophils Absolute: 0 10*3/uL (ref 0.0–0.7)
Eosinophils Relative: 0 % (ref 0–5)
HCT: 36.8 % — ABNORMAL LOW (ref 39.0–52.0)
Hemoglobin: 12.2 g/dL — ABNORMAL LOW (ref 13.0–17.0)
Lymphocytes Relative: 9 % — ABNORMAL LOW (ref 12–46)
Lymphs Abs: 1.2 10*3/uL (ref 0.7–4.0)
MCH: 27.2 pg (ref 26.0–34.0)
MCHC: 33.2 g/dL (ref 30.0–36.0)
MCV: 82 fL (ref 78.0–100.0)
Monocytes Absolute: 0.8 10*3/uL (ref 0.1–1.0)
Monocytes Relative: 6 % (ref 3–12)
Neutro Abs: 12 10*3/uL — ABNORMAL HIGH (ref 1.7–7.7)
Neutrophils Relative %: 85 % — ABNORMAL HIGH (ref 43–77)
Platelets: 212 10*3/uL (ref 150–400)
RBC: 4.49 MIL/uL (ref 4.22–5.81)
RDW: 15.4 % (ref 11.5–15.5)
WBC: 14.1 10*3/uL — ABNORMAL HIGH (ref 4.0–10.5)

## 2014-01-19 LAB — COMPREHENSIVE METABOLIC PANEL
ALT: 15 U/L (ref 0–53)
ANION GAP: 18 — AB (ref 5–15)
AST: 15 U/L (ref 0–37)
Albumin: 3.3 g/dL — ABNORMAL LOW (ref 3.5–5.2)
Alkaline Phosphatase: 66 U/L (ref 39–117)
BILIRUBIN TOTAL: 0.4 mg/dL (ref 0.3–1.2)
BUN: 15 mg/dL (ref 6–23)
CO2: 21 meq/L (ref 19–32)
Calcium: 8.2 mg/dL — ABNORMAL LOW (ref 8.4–10.5)
Chloride: 103 mEq/L (ref 96–112)
Creatinine, Ser: 0.78 mg/dL (ref 0.50–1.35)
GFR calc Af Amer: >90 mL/min (ref >90)
GFR, EST NON AFRICAN AMERICAN: 85 mL/min — AB (ref >90)
GLUCOSE: 156 mg/dL — AB (ref 70–99)
POTASSIUM: 3.7 meq/L (ref 3.7–5.3)
Sodium: 142 mEq/L (ref 137–147)
Total Protein: 6.4 g/dL (ref 6.0–8.3)

## 2014-01-19 LAB — TROPONIN I
Troponin I: <0.3 ng/mL (ref <0.30)
Troponin I: <0.3 ng/mL (ref <0.30)

## 2014-01-19 LAB — LACTIC ACID, PLASMA: Lactic Acid, Venous: 2.6 mmol/L — ABNORMAL HIGH (ref 0.5–2.2)

## 2014-01-19 LAB — TSH: TSH: 0.827 u[IU]/mL (ref 0.350–4.500)

## 2014-01-19 MED ORDER — HYDROCODONE-ACETAMINOPHEN 5-325 MG PO TABS
1.0000 | ORAL_TABLET | ORAL | Status: DC | PRN
  Administered 2014-01-19 – 2014-01-21 (×6): 1 via ORAL
  Administered 2014-01-22 (×3): 2 via ORAL
  Administered 2014-01-23 – 2014-01-24 (×4): 1 via ORAL
  Administered 2014-01-24: 2 via ORAL
  Filled 2014-01-19: qty 2
  Filled 2014-01-19: qty 1
  Filled 2014-01-19: qty 2
  Filled 2014-01-19 (×3): qty 1
  Filled 2014-01-19 (×2): qty 2
  Filled 2014-01-19 (×5): qty 1
  Filled 2014-01-19: qty 2

## 2014-01-19 MED ORDER — PANTOPRAZOLE SODIUM 40 MG IV SOLR
40.0000 mg | Freq: Two times a day (BID) | INTRAVENOUS | Status: DC
  Administered 2014-01-19 – 2014-01-20 (×3): 40 mg via INTRAVENOUS
  Filled 2014-01-19 (×5): qty 40

## 2014-01-19 MED ORDER — ALUM & MAG HYDROXIDE-SIMETH 200-200-20 MG/5ML PO SUSP
30.0000 mL | ORAL | Status: DC | PRN
  Administered 2014-01-19: 30 mL via ORAL
  Filled 2014-01-19 (×2): qty 30

## 2014-01-19 MED ORDER — INSULIN ASPART 100 UNIT/ML ~~LOC~~ SOLN
0.0000 [IU] | SUBCUTANEOUS | Status: DC
  Administered 2014-01-19: 1 [IU] via SUBCUTANEOUS
  Administered 2014-01-19: 2 [IU] via SUBCUTANEOUS
  Administered 2014-01-19 (×2): 1 [IU] via SUBCUTANEOUS
  Administered 2014-01-19: 2 [IU] via SUBCUTANEOUS
  Administered 2014-01-19 – 2014-01-20 (×4): 1 [IU] via SUBCUTANEOUS
  Administered 2014-01-20: 2 [IU] via SUBCUTANEOUS
  Administered 2014-01-20 – 2014-01-21 (×2): 1 [IU] via SUBCUTANEOUS

## 2014-01-19 NOTE — Consult Note (Signed)
PULMONARY / CRITICAL CARE MEDICINE   Name: [name] MRN: [id number] DOB: 21-Mar-1935    ADMISSION DATE:  01/17/2014 CONSULTATION DATE:  01/19/14  REFERRING MD :  Dr Sherwood Gambler PRIMARY SERVICE: NS  CHIEF COMPLAINT:  Chest pain  BRIEF PATIENT DESCRIPTION: 79 s/p subacute SDH evacuation, post op Chest pain  SIGNIFICANT EVENTS / STUDIES:  7/11- Right frontal parietal craniotomy and evacuation of subdural hematoma  7/12 chest pain  LINES / TUBES: 7/11 aline>>>  CULTURES:   ANTIBIOTICS:   HISTORY OF PRESENT ILLNESS:  Patient is a 79 y.o. male h/o cad, stent who is admitted for treatment and management of a right hemispheric subdural hematoma. He presented to the emergency room with worsening intermittent weakness and clumsiness on the left side. CT of the brain without contrast revealed a moderately large right hemispheric subacute subdural hematoma, with moderate mass effect on the right hemisphere. H/o noted of  falls in May.  Underwent Right frontal parietal craniotomy and evacuation of subdural hematoma.  Back to 57munit off vent, no pressors. Developed chest pain late evening. Hemodynamics with some HTN.  Treated hydralazine.   PAST MEDICAL HISTORY :  Past Medical History  Diagnosis Date  . Coronary atherosclerosis of unspecified type of vessel, native or graft     s/p stent mid LAD 2005  . DJD (degenerative joint disease)   . GERD (gastroesophageal reflux disease)   . DM (diabetes mellitus)   . HTN (hypertension)   . Hyperlipidemia    Past Surgical History  Procedure Laterality Date  . Ptca      Hx of it.   . Knee surgery    . Kidney stone removal    . Sigmoidoscopy    . Total knee arthroplasty  02/2011    Right knee   Prior to Admission medications   Medication Sig Start Date End Date Taking? Authorizing Provider  aspirin EC 81 MG tablet Take 1 tablet (81 mg total) by mouth daily. 10/01/12  Yes CBurnell Blanks MD  atenolol-chlorthalidone (TENORETIC)  50-25 MG per tablet Take 1 tablet by mouth daily. 11/27/13  Yes CBurnell Blanks MD  Flaxseed, Linseed, (FLAX SEED OIL) 1000 MG CAPS Take 1 capsule by mouth 2 (two) times daily.     Yes Historical Provider, MD  fluticasone (FLONASE) 50 MCG/ACT nasal spray Place 2 sprays into both nostrils daily. 11/13/13  Yes Amy ECletis Athens MD  isosorbide mononitrate (IMDUR) 60 MG 24 hr tablet Take 60 mg by mouth every morning.   Yes Historical Provider, MD  naproxen sodium (ANAPROX) 220 MG tablet Take 440 mg by mouth every morning.    Yes Historical Provider, MD  nitroGLYCERIN (NITROSTAT) 0.4 MG SL tablet Place 0.4 mg under the tongue every 5 (five) minutes as needed. For chest pain   Yes Historical Provider, MD  potassium chloride SA (K-DUR,KLOR-CON) 20 MEQ tablet Take 2 tablets (40 mEq total) by mouth 2 (two) times daily. 11/27/13  Yes CBurnell Blanks MD  pravastatin (PRAVACHOL) 20 MG tablet Take 20 mg by mouth every evening.   Yes Historical Provider, MD   Allergies  Allergen Reactions  . Statins Other (See Comments)    Caused soreness (Lipitor, Zocor)    FAMILY HISTORY:  History reviewed. No pertinent family history. SOCIAL HISTORY:  reports that he has quit smoking. He has never used smokeless tobacco. He reports that he does not drink alcohol or use illicit drugs.  REVIEW OF SYSTEMS:  No sob, no abdo pain, vomit  noted, sleeping, difficult to obtain further  SUBJECTIVE: sleeping  VITAL SIGNS: Temp:  [97.3 F (36.3 C)-99.5 F (37.5 C)] 99.5 F (37.5 C) (07/11 2317) Pulse Rate:  [55-106] 93 (07/12 0105) Resp:  [10-24] 20 (07/12 0105) BP: (86-187)/(38-90) 103/52 mmHg (07/12 0105) SpO2:  [92 %-97 %] 95 % (07/12 0105) Arterial Line BP: (113-153)/(45-65) 120/45 mmHg (07/11 1445) Weight:  [86.3 kg (190 lb 4.1 oz)] 86.3 kg (190 lb 4.1 oz) (07/11 0515) HEMODYNAMICS:   VENTILATOR SETTINGS:   INTAKE / OUTPUT: Intake/Output     07/11 0701 - 07/12 0700   I.V. (mL/kg) 1816.7 (21.1)   IV  Piggyback 100   Total Intake(mL/kg) 1916.7 (22.2)   Urine (mL/kg/hr) 1460 (0.7)   Drains 350 (0.2)   Blood 50 (0)   Total Output 1860   Net +56.7         PHYSICAL EXAMINATION: General:  Sleeping, no distress Neuro:  Perr, equal strength HEENT:  jvd wnl Cardiovascular:  s1 s2 RRR not tachy Lungs:  CTA Abdomen:  Soft, bs wnl, no r/g Musculoskeletal:  No edema Skin:  Wound wrap clean  LABS:  CBC  Recent Labs Lab 01/18/14 0054 01/18/14 2208  WBC 8.7 14.9*  HGB 12.5* 13.0  HCT 37.7* 39.2  PLT 223 239   Coag's  Recent Labs Lab 01/18/14 0229  INR 0.96   BMET  Recent Labs Lab 01/18/14 0054 01/18/14 2208  NA 143 139  K 3.5* 3.8  CL 103 101  CO2 22 18*  BUN 21 14  CREATININE 0.80 0.66  GLUCOSE 108* 176*   Electrolytes  Recent Labs Lab 01/18/14 0054 01/18/14 2208  CALCIUM 9.6 8.4   Sepsis Markers No results found for this basename: LATICACIDVEN, PROCALCITON, O2SATVEN,  in the last 168 hours ABG No results found for this basename: PHART, PCO2ART, PO2ART,  in the last 168 hours Liver Enzymes No results found for this basename: AST, ALT, ALKPHOS, BILITOT, ALBUMIN,  in the last 168 hours Cardiac Enzymes  Recent Labs Lab 01/18/14 2230  TROPONINI <0.30   Glucose  Recent Labs Lab 01/18/14 1040 01/18/14 1650 01/18/14 2005  GLUCAP 92 148* 128*    Imaging Ct Head Wo Contrast  01/18/2014   CLINICAL DATA:  Fall, occipital head trauma 7-10 days ago. Left-sided weakness.  EXAM: CT HEAD WITHOUT CONTRAST  TECHNIQUE: Contiguous axial images were obtained from the base of the skull through the vertex without intravenous contrast.  COMPARISON:  No similar prior exam is available at this institution for comparison or on [facility].  FINDINGS: There is a mixed attenuation acute to subacute right subdural hematoma measuring 1.9 cm maximally over the lateral convexity image 21. Adjacent sulcal edema is noted with mild possible mass effect upon the right lateral  ventricle. 7 mm leftward midline shift is identified. No mass or infarct is identified. Mild sinusitis is noted. Orbits are grossly intact. No skull fracture.  IMPRESSION: Mixed density acute to subacute large right subdural hematoma causing mass effect due to edema and 7 mm leftward midline shift.  Critical Value/emergent results were called by telephone at the time of interpretation on 01/18/2014 at 2:26 AM to Dr. Linton Flemings , who verbally acknowledged these results.   Electronically Signed   By: Conchita Paris M.D.   On: 01/18/2014 02:28   Dg Chest Port 1 View  01/18/2014   CLINICAL DATA:  Chest pain  EXAM: PORTABLE CHEST - 1 VIEW  COMPARISON:  07/05/2013  FINDINGS: Patient is rotated to the  right. The aorta is unfolded and ectatic. Borderline cardiomegaly noted but not well assessed due to patient positioning. Lungs are hypoaerated with crowding of the bronchovascular markings. Allowing for this, no focal pulmonary opacity is identified. No pleural effusion.  IMPRESSION: Allowing for positioning, no focal acute finding. If symptoms persist, consider PA and lateral chest radiographs obtained at full inspiration when the patient is clinically able.   Electronically Signed   By: Conchita Paris M.D.   On: 01/18/2014 23:27     CXR: rotated right, wide mediastinum, smal lung volumes  ASSESSMENT / PLAN:  PULMONARY A: mild O2 need, r/o aspiration (s/p vomit x 3) P:   Has small lung volumes, IS when able and upright See [facility] for repeat pcxr  CARDIOVASCULAR A: chest pain unclear etiology, Likely Gi related (location not typical cardiac) r/o ischemia, Wide mediastinum on pcxr with rotation sig to right, r/o aspiration P:  Repeat pcxr now without rotation for assessment mediastinum - may need CT chest Repeat trop BB administration IV preferred Morphine NTG Low threshold echo assessment  RENAL A:  Unclear met acidosis P:   Lactic now bmet repeat  now  GASTROINTESTINAL A:  Vomit x 3, h/o  Hiatal hernia, r/o residual anesthetic affect on gastric P:   zofran Npo lft ppi to bid Add mylanta  HEMATOLOGIC A:  DVT prevention P:  scd  INFECTIOUS A:  At risk aspiration P:   pcxr repeat Follow fever curve  ENDOCRINE A:   Hyperglucemia P:   cbg ssi tsh  NEUROLOGIC A:  S/p evacuation SDH P:   RASS goal: 0 Per NS  TODAY'S SUMMARY: Chest pain, likely GI, bid ppi, add mylanta (resolved now after vomiting) , repeat trop, obtain lactic has unclear met acidosis, repeat pcxr for mediastinum  I have personally obtained a history, examined the patient, evaluated laboratory and imaging results, formulated the assessment and plan and placed orders.Lavon Paganini. Titus Mould, MD, [place] Pgr: [facility]  Pulmonary and [place] Pager: [phone]  01/19/2014, 1:19 AM

## 2014-01-19 NOTE — Progress Notes (Signed)
Utilization Review Completed.[name] T7/06/2014  

## 2014-01-19 NOTE — Consult Note (Signed)
PULMONARY / CRITICAL CARE MEDICINE   Name: [name] MRN: [id number] DOB: 07/23/1934    ADMISSION DATE:  01/17/2014 CONSULTATION DATE:  01/19/14  REFERRING MD :  Dr Sherwood Gambler PRIMARY SERVICE: NS  CHIEF COMPLAINT:  Chest pain  BRIEF PATIENT DESCRIPTION: 79 s/p subacute SDH evacuation, post op Chest pain  SIGNIFICANT EVENTS / STUDIES:  7/11- Right frontal parietal craniotomy and evacuation of subdural hematoma 7/12 - chest pain  LINES / TUBES: 7/11 aline>>>  CULTURES:  ANTIBIOTICS:  HISTORY OF PRESENT ILLNESS:  Patient is a 79 y.o. male h/o cad, stent who is admitted for treatment and management of a right hemispheric subdural hematoma. He presented to the emergency room with worsening intermittent weakness and clumsiness on the left side. CT of the brain without contrast revealed a moderately large right hemispheric subacute subdural hematoma, with moderate mass effect on the right hemisphere. H/o noted of  falls in May.  Underwent Right frontal parietal craniotomy and evacuation of subdural hematoma.  Back to 66munit off vent, no pressors. Developed chest pain late evening. Hemodynamics with some HTN.  Treated hydralazine.  SUBJECTIVE:  Sleepy, up to chair, CP has resolved but still nauseated  VITAL SIGNS: Temp:  [97.3 F (36.3 C)-99.7 F (37.6 C)] 99.4 F (37.4 C) (07/12 1200) Pulse Rate:  [55-106] 84 (07/12 1100) Resp:  [11-25] 11 (07/12 1100) BP: (86-187)/(38-86) 125/68 mmHg (07/12 1100) SpO2:  [92 %-97 %] 93 % (07/12 1100) Arterial Line BP: (113-153)/(45-65) 120/45 mmHg (07/11 1445) HEMODYNAMICS:   VENTILATOR SETTINGS:   INTAKE / OUTPUT: Intake/Output     07/11 0701 - 07/12 0700 07/12 0701 - 07/13 0700   I.V. (mL/kg) 2616.7 (30.3) 400 (4.6)   IV Piggyback 100 100   Total Intake(mL/kg) 2716.7 (31.5) 500 (5.8)   Urine (mL/kg/hr) 2735 (1.3) 310 (0.6)   Drains 360 (0.2)    Blood 50 (0)    Total Output 3145 310   Net -428.3 +190          PHYSICAL  EXAMINATION: General:  Up to chair Neuro:  Perr, equal strength HEENT:  jvd wnl Cardiovascular:  s1 s2 RRR not tachy Lungs:  CTA Abdomen:  Soft, bs wnl, no r/g Musculoskeletal:  No edema Skin:  Wound wrap clean  LABS:  CBC  Recent Labs Lab 01/18/14 0054 01/18/14 2208 01/19/14 0400  WBC 8.7 14.9* 14.1*  HGB 12.5* 13.0 12.2*  HCT 37.7* 39.2 36.8*  PLT 223 239 212   Coag's  Recent Labs Lab 01/18/14 0229  INR 0.96   BMET  Recent Labs Lab 01/18/14 0054 01/18/14 2208 01/19/14 0400  NA 143 139 142  142  K 3.5* 3.8 3.7  3.7  CL 103 101 103  103  CO2 22 18* 22  21  BUN '21 14 14  15  ' CREATININE 0.80 0.66 0.76  0.78  GLUCOSE 108* 176* 155*  156*   Electrolytes  Recent Labs Lab 01/18/14 0054 01/18/14 2208 01/19/14 0400  CALCIUM 9.6 8.4 8.3*  8.2*   Sepsis Markers  Recent Labs Lab 01/19/14 0419  LATICACIDVEN 2.6*   ABG No results found for this basename: PHART, PCO2ART, PO2ART,  in the last 168 hours Liver Enzymes  Recent Labs Lab 01/19/14 0400  AST 15  ALT 15  ALKPHOS 66  BILITOT 0.4  ALBUMIN 3.3*   Cardiac Enzymes  Recent Labs Lab 01/18/14 2230 01/19/14 0418 01/19/14 1103  TROPONINI <0.30 <0.30 <0.30   Glucose  Recent Labs Lab 01/18/14 1650 01/18/14 2005  01/18/14 2308 01/19/14 0307 01/19/14 0750 01/19/14 1203  GLUCAP 148* 128* 163* 173* 126* 155*    Imaging Ct Head Wo Contrast  01/18/2014   CLINICAL DATA:  Fall, occipital head trauma 7-10 days ago. Left-sided weakness.  EXAM: CT HEAD WITHOUT CONTRAST  TECHNIQUE: Contiguous axial images were obtained from the base of the skull through the vertex without intravenous contrast.  COMPARISON:  No similar prior exam is available at this institution for comparison or on [facility].  FINDINGS: There is a mixed attenuation acute to subacute right subdural hematoma measuring 1.9 cm maximally over the lateral convexity image 21. Adjacent sulcal edema is noted with mild possible mass  effect upon the right lateral ventricle. 7 mm leftward midline shift is identified. No mass or infarct is identified. Mild sinusitis is noted. Orbits are grossly intact. No skull fracture.  IMPRESSION: Mixed density acute to subacute large right subdural hematoma causing mass effect due to edema and 7 mm leftward midline shift.  Critical Value/emergent results were called by telephone at the time of interpretation on 01/18/2014 at 2:26 AM to Dr. Linton Flemings , who verbally acknowledged these results.   Electronically Signed   By: Conchita Paris M.D.   On: 01/18/2014 02:28   Dg Chest Port 1 View  01/19/2014   CLINICAL DATA:  Apparent mediastinal widening on prior exam, chest pain  EXAM: PORTABLE CHEST - 1 VIEW  COMPARISON:  01/19/2011, 07/05/2013  FINDINGS: Allowing for AP technique, which may accentuate the mediastinal and cardiac contours, there is persistent aortic ectasia but the appearance is stable when compared to the 07/05/2013 prior exam and the mediastinum does not appear widened allowing for technique. Large hiatal hernia reidentified. No change otherwise.  IMPRESSION: No persistent apparent widening of the mediastinum, likely technical.   Electronically Signed   By: Conchita Paris M.D.   On: 01/19/2014 01:53   Dg Chest Port 1 View  01/18/2014   CLINICAL DATA:  Chest pain  EXAM: PORTABLE CHEST - 1 VIEW  COMPARISON:  07/05/2013  FINDINGS: Patient is rotated to the right. The aorta is unfolded and ectatic. Borderline cardiomegaly noted but not well assessed due to patient positioning. Lungs are hypoaerated with crowding of the bronchovascular markings. Allowing for this, no focal pulmonary opacity is identified. No pleural effusion.  IMPRESSION: Allowing for positioning, no focal acute finding. If symptoms persist, consider PA and lateral chest radiographs obtained at full inspiration when the patient is clinically able.   Electronically Signed   By: Conchita Paris M.D.   On: 01/18/2014 23:27      CXR: rotated right, wide mediastinum, smal lung volumes  ASSESSMENT / PLAN:  PULMONARY A: mild O2 need, CXR without evidence aspiration P:   Has small lung volumes, IS when able and upright  CARDIOVASCULAR A: chest pain unclear etiology, Likely Gi related (location not typical cardiac), troponins negative. Wide mediastinum on pcxr was technical > none evident on repeat film 7/12 P:  Repeat ECG if pain returns Restart Imdur, atenolol, Pravachol when tolerating PO's  RENAL A:  Unclear met acidosis post-op > resolved P:   Follow BMP intermittently  GASTROINTESTINAL A:  Vomit x 3, h/o Hiatal hernia, r/o residual anesthetic affect on gastric P:   zofran prn Start diet 7/12 ppi to bid > change back to qd on 7/13  HEMATOLOGIC A:  DVT prevention P:  scd  INFECTIOUS A:  No evidence acute infxn  P:     ENDOCRINE A:   Hyperglycemia TSH  normal P:     NEUROLOGIC A:  S/p evacuation SDH P:   RASS goal: 0 Per NS  TODAY'S SUMMARY:  Non-cardiac chest pain in pt with hx CAD. S/p crani for SDH evacuation. PCCM will sign off. Please call if we can help further.   Baltazar Apo, MD, PhD 01/19/2014, 12:57 PM [facility] [phone] or if no answer [phone]

## 2014-01-19 NOTE — Progress Notes (Signed)
Subjective: Patient without complaints. Asking for breakfast. Moderate drainage into Jackson-Pratt drain.  Did have difficulty last night with chest pain and vomiting. Given nitroglycerin, Lopressor, and Zofran. CCM reports ST segment depressions on EKG. Appreciate assistance of both Drs. Mauri Brooklyn and Merrie Roof from [facility]. Dr. Titus Mould feels that symptoms are worse likely arising from the patient had a hernia rather than from cardiac ischemia.  Objective: Vital signs in last 24 hours: Filed Vitals:   01/19/14 0600 01/19/14 0700 01/19/14 0751 01/19/14 0800  BP: 110/66 123/56  130/64  Pulse: 96 88  84  Temp:   99.5 F (37.5 C)   TempSrc:   Oral   Resp: 25 22  21   Height:      Weight:      SpO2: 94% 95%  93%    Intake/Output from previous day: 07/11 0701 - 07/12 0700 In: 2716.7 [I.V.:2616.7; IV Piggyback:100] Out: 3267 [Urine:2735; Drains:360; Blood:50] Intake/Output this shift:    Physical Exam:  Awake, alert, oriented. Following commands. Moving all 4 extremities well. No drift of upper extremities. Dressing clean and dry.  CBC  Recent Labs  01/18/14 2208 01/19/14 0400  WBC 14.9* 14.1*  HGB 13.0 12.2*  HCT 39.2 36.8*  PLT 239 212   BMET  Recent Labs  01/18/14 2208 01/19/14 0400  NA 139 142  142  K 3.8 3.7  3.7  CL 101 103  103  CO2 18* 22  21  GLUCOSE 176* 155*  156*  BUN 14 14  15   CREATININE 0.66 0.76  0.78  CALCIUM 8.4 8.3*  8.2*    Studies/Results: Ct Head Wo Contrast  01/18/2014   CLINICAL DATA:  Fall, occipital head trauma 7-10 days ago. Left-sided weakness.  EXAM: CT HEAD WITHOUT CONTRAST  TECHNIQUE: Contiguous axial images were obtained from the base of the skull through the vertex without intravenous contrast.  COMPARISON:  No similar prior exam is available at this institution for comparison or on [facility].  FINDINGS: There is a mixed attenuation acute to subacute right subdural hematoma measuring 1.9 cm maximally over the lateral  convexity image 21. Adjacent sulcal edema is noted with mild possible mass effect upon the right lateral ventricle. 7 mm leftward midline shift is identified. No mass or infarct is identified. Mild sinusitis is noted. Orbits are grossly intact. No skull fracture.  IMPRESSION: Mixed density acute to subacute large right subdural hematoma causing mass effect due to edema and 7 mm leftward midline shift.  Critical Value/emergent results were called by telephone at the time of interpretation on 01/18/2014 at 2:26 AM to Dr. Linton Flemings , who verbally acknowledged these results.   Electronically Signed   By: Conchita Paris M.D.   On: 01/18/2014 02:28   Dg Chest Port 1 View  01/19/2014   CLINICAL DATA:  Apparent mediastinal widening on prior exam, chest pain  EXAM: PORTABLE CHEST - 1 VIEW  COMPARISON:  01/19/2011, 07/05/2013  FINDINGS: Allowing for AP technique, which may accentuate the mediastinal and cardiac contours, there is persistent aortic ectasia but the appearance is stable when compared to the 07/05/2013 prior exam and the mediastinum does not appear widened allowing for technique. Large hiatal hernia reidentified. No change otherwise.  IMPRESSION: No persistent apparent widening of the mediastinum, likely technical.   Electronically Signed   By: Conchita Paris M.D.   On: 01/19/2014 01:53   Dg Chest Port 1 View  01/18/2014   CLINICAL DATA:  Chest pain  EXAM: PORTABLE CHEST -  1 VIEW  COMPARISON:  07/05/2013  FINDINGS: Patient is rotated to the right. The aorta is unfolded and ectatic. Borderline cardiomegaly noted but not well assessed due to patient positioning. Lungs are hypoaerated with crowding of the bronchovascular markings. Allowing for this, no focal pulmonary opacity is identified. No pleural effusion.  IMPRESSION: Allowing for positioning, no focal acute finding. If symptoms persist, consider PA and lateral chest radiographs obtained at full inspiration when the patient is clinically able.    Electronically Signed   By: Conchita Paris M.D.   On: 01/18/2014 23:27    Assessment/Plan: Doing well from a neurosurgical perspective. We'll check CT brain without contrast in a.m. In the meantime we'll begin a regular diet, and begin out of bed to chair, to progress to ambulation in the ICU with the assistance of a staff. Will DC a line and Foley.   Hosie Spangle, MD 01/19/2014, 9:01 AM

## 2014-01-19 NOTE — Progress Notes (Signed)
Pt stated he was having "terable chest pain."  Pt then began to vomit X3.  RN obtained EKG and gave Antiemetic, pain, blood pressure meds, and Nitroglycerin X2 SL tabs at 2200 & 2206 .   RN paged Neuro Surgery and Dr. Sherwood Gambler returned call & was notified of change in pt status.   Dr. Sherwood Gambler gave orders for labs, additional dose of Zofran, and for RN to contact CCM MD.   Warren Lacy MD was notified of pt status.   Dr. Tamala Julian ordered additional labs, chest x-ray, lopressor, and Nitroglycerin drip. Dr. Tamala Julian gave RN orders to start nitroglycerin drip if pt's SBP becomes >140.

## 2014-01-20 ENCOUNTER — Inpatient Hospital Stay (HOSPITAL_COMMUNITY): Payer: Medicare HMO

## 2014-01-20 ENCOUNTER — Encounter (HOSPITAL_COMMUNITY): Payer: Self-pay | Admitting: Neurosurgery

## 2014-01-20 LAB — GLUCOSE, CAPILLARY
Glucose-Capillary: 137 mg/dL — ABNORMAL HIGH (ref 70–99)
Glucose-Capillary: 134 mg/dL — ABNORMAL HIGH (ref 70–99)
Glucose-Capillary: 146 mg/dL — ABNORMAL HIGH (ref 70–99)
Glucose-Capillary: 146 mg/dL — ABNORMAL HIGH (ref 70–99)
Glucose-Capillary: 157 mg/dL — ABNORMAL HIGH (ref 70–99)

## 2014-01-20 MED ORDER — LEVETIRACETAM 500 MG PO TABS
500.0000 mg | ORAL_TABLET | Freq: Two times a day (BID) | ORAL | Status: DC
  Administered 2014-01-20 – 2014-01-24 (×8): 500 mg via ORAL
  Filled 2014-01-20 (×11): qty 1

## 2014-01-20 MED ORDER — PANTOPRAZOLE SODIUM 40 MG PO TBEC
40.0000 mg | DELAYED_RELEASE_TABLET | Freq: Two times a day (BID) | ORAL | Status: DC
  Administered 2014-01-20 – 2014-01-21 (×3): 40 mg via ORAL
  Filled 2014-01-20 (×3): qty 1

## 2014-01-21 LAB — GLUCOSE, CAPILLARY
Glucose-Capillary: 115 mg/dL — ABNORMAL HIGH (ref 70–99)
Glucose-Capillary: 126 mg/dL — ABNORMAL HIGH (ref 70–99)
Glucose-Capillary: 110 mg/dL — ABNORMAL HIGH (ref 70–99)
Glucose-Capillary: 109 mg/dL — ABNORMAL HIGH (ref 70–99)
Glucose-Capillary: 149 mg/dL — ABNORMAL HIGH (ref 70–99)

## 2014-01-21 MED ORDER — INSULIN ASPART 100 UNIT/ML ~~LOC~~ SOLN: 0.0000 [IU] | Freq: Every day | SUBCUTANEOUS | Status: DC

## 2014-01-21 MED ORDER — INSULIN ASPART 100 UNIT/ML ~~LOC~~ SOLN: 0.0000 [IU] | Freq: Three times a day (TID) | SUBCUTANEOUS | Status: DC

## 2014-01-21 NOTE — Progress Notes (Signed)
Subjective: Patient resting comfortably in bed, ambulated once yesterday through the halls of the ICU.  He is still primarily just taking clear liquids, with limited solids.  His Jackson-Pratt drain has had minimal output.  Objective: Vital signs in last 24 hours: Filed Vitals:   01/21/14 0341 01/21/14 0400 01/21/14 0500 01/21/14 0600  BP:  114/62 151/76 138/78  Pulse:  62 67 60  Temp: 98 F (36.7 C)     TempSrc: Oral     Resp:  20 16 18   Height:      Weight:      SpO2:  92% 93% 93%    Intake/Output from previous day: 07/13 0701 - 07/14 0700 In: 2865 [P.O.:565; I.V.:2100; IV Piggyback:200] Out: 2465 [Urine:2425; Drains:40] Intake/Output this shift:    Physical Exam:  Patient is awake and alert, fully oriented.  Speech is fluent.  He has good comprehension.  He follows commands well.  Moving all 4 extremities well.  No drift of the upper extremity.  Dressing and Jackson-Pratt drain were removed, and a new sterile dressing was applied.  CBC  Recent Labs  01/18/14 2208 01/19/14 0400  WBC 14.9* 14.1*  HGB 13.0 12.2*  HCT 39.2 36.8*  PLT 239 212   BMET  Recent Labs  01/18/14 2208 01/19/14 0400  NA 139 142  142  K 3.8 3.7  3.7  CL 101 103  103  CO2 18* 22  21  GLUCOSE 176* 155*  156*  BUN 14 14  15   CREATININE 0.66 0.76  0.78  CALCIUM 8.4 8.3*  8.2*    Studies/Results: Ct Head Wo Contrast  01/20/2014   CLINICAL DATA:  Right craniotomy for evacuation of subdural hematoma, follow-up.  EXAM: CT HEAD WITHOUT CONTRAST  TECHNIQUE: Contiguous axial images were obtained from the base of the skull through the vertex without intravenous contrast.  COMPARISON:  CT of the head January 18, 2014  FINDINGS: Interval right frontal temporal craniotomy for placement of extra-axial surgical drain, with partial evacuation of the right acute on chronic holohemispheric subdural hematoma now measuring up to 11 mm, previously 19 mm. Partially re-expanded right lateral ventricle  without hydrocephalus. 2 mm right to left midline shift, previously 7 mm. Small amount of right extra-axial pneumocephalus.  No intraparenchymal hemorrhage. No acute large vascular territory infarct. Basal cisterns are patent. Mild calcific atherosclerosis of the carotid siphons included vertebral arteries.  Status post bilateral ocular lens implants. Mild paranasal sinus mucosal thickening without air-fluid levels, small left maxillary mucosal retention cyst. Minimal soft tissue density in right mastoid air cells.  IMPRESSION: Status post interval right craniotomy and placement of surgical drain for partial evacuation of right subdural hematoma, previously 19 mm, now 11 mm. 2 mm residual right-to-left midline shift, previously 7 mm.   Electronically Signed   By: Elon Alas   On: 01/20/2014 04:53    Assessment/Plan: Patient remains neurologically stable following evacuation of subdural hematoma 3 days ago.  Have encouraged patient to increase activity including ambulation.  Will plan on follow-up CT of the brain without contrast later this week.   Hosie Spangle, MD 01/21/2014, 7:59 AM

## 2014-01-22 LAB — GLUCOSE, CAPILLARY
Glucose-Capillary: 120 mg/dL — ABNORMAL HIGH (ref 70–99)
Glucose-Capillary: 113 mg/dL — ABNORMAL HIGH (ref 70–99)

## 2014-01-22 NOTE — Evaluation (Signed)
Physical Therapy Evaluation Patient Details Name: [name] MRN: [id number] DOB: 1934/07/23 Today's Date: 01/22/2014   History of Present Illness  Admitted for management of worsening L sided weakness and clumbsiness due to R SDH.  S/P craniotomy for evac.  Clinical Impression  Pt admitted with/for management of SDH.  Pt currently limited functionally due to the problems listed below.  (see problems list.)  Pt will benefit from PT to maximize function and safety to be able to get home safely with available assist of family.     Follow Up Recommendations Outpatient PT;Other (comment) (or HHPT with transfer to [place])    Equipment Recommendations   (TBA before D/C)    Recommendations for Other Services       Precautions / Restrictions Precautions Precautions: Fall Restrictions Weight Bearing Restrictions: No      Mobility  Bed Mobility Overal bed mobility: Needs Assistance Bed Mobility: Sidelying to Sit;Sit to Supine   Sidelying to sit: Supervision   Sit to supine: Supervision   General bed mobility comments: fluid movement to EOB  Transfers Overall transfer level: Needs assistance Equipment used: Rolling walker (2 wheeled) Transfers: Sit to/from Stand Sit to Stand: Supervision            Ambulation/Gait Ambulation/Gait assistance: Min guard Ambulation Distance (Feet): 200 Feet (total, 150 with SPC, 50' with RW) Assistive device: Rolling walker (2 wheeled);Straight cane Gait Pattern/deviations: Step-through pattern Gait velocity: slower, but able to increase speed appreciably   General Gait Details: mildly unsteady on L side, worsening with scanning and head movements esp looking up. Other challenges like backing up produced instability  Stairs            Wheelchair Mobility    Modified Rankin (Stroke Patients Only)       Balance Overall balance assessment: Needs assistance Sitting-balance support: Feet supported;No upper extremity  supported Sitting balance-Leahy Scale: Good     Standing balance support: No upper extremity supported Standing balance-Leahy Scale: Fair               High level balance activites: Backward walking;Direction changes;Turns;Head turns;Sudden stops High Level Balance Comments: As he progressed fatigued caused the higher level balance challenges to affect him  to a greater extent.             Pertinent Vitals/Pain     Home Living Family/patient expects to be discharged to:: Private residence Living Arrangements: Spouse/significant other Available Help at Discharge: Family Type of Home: House Home Access: Stairs to enter       Home Equipment: Kasandra Knudsen - single point;Walker - 2 wheels      Prior Function Level of Independence: Independent with assistive device(s)               Hand Dominance        Extremity/Trunk Assessment               Lower Extremity Assessment: LLE deficits/detail   LLE Deficits / Details: Strength grossly >4/5, but fatigues with holds and shows fatigue with mobility     Communication   Communication: No difficulties  Cognition Arousal/Alertness: Awake/alert Behavior During Therapy: WFL for tasks assessed/performed Overall Cognitive Status: Within Functional Limits for tasks assessed                      General Comments      Exercises        Assessment/Plan    PT Assessment Patient needs continued PT services  PT Diagnosis Abnormality of gait;Difficulty walking   PT Problem List Decreased strength;Decreased coordination;Decreased activity tolerance;Decreased balance;Decreased knowledge of use of DME  PT Treatment Interventions DME instruction;Gait training;Stair training;Functional mobility training;Therapeutic activities;Balance training;Patient/family education;Neuromuscular re-education   PT Goals (Current goals can be found in the Care Plan section) Acute Rehab PT Goals Patient Stated Goal: Back on my  Harley PT Goal Formulation: With patient Time For Goal Achievement: 01/29/14 Potential to Achieve Goals: Good    Frequency Min 3X/week   Barriers to discharge        Co-evaluation               End of Session   Activity Tolerance: Patient tolerated treatment well Patient left: in bed;with call bell/phone within reach;with family/visitor present Nurse Communication: Mobility status         Time: 5784-6962 PT Time Calculation (min): 23 min   Charges:   PT Evaluation $Initial PT Evaluation Tier I: 1 Procedure PT Treatments $Gait Training: 8-22 mins   PT G Codes:          Jaquelyne Firkus, Tessie Fass 01/22/2014, 4:54 PM 01/22/2014  Donnella Sham, PT [phone]  (pager)

## 2014-01-22 NOTE — Progress Notes (Signed)
01/22/14 0852  Mobility  Activity Ambulate in hall;Ambulate in room;Chair;Back to bed  Level of Assistance Minimal assist, patient does 75% or more  Assistive Device Front wheel walker  Ambulation Response Tolerated well  Range of Motion Active;All extremities

## 2014-01-22 NOTE — Progress Notes (Signed)
Patient arrived around 2000, was in good spirits and did not complain of pain. Set up SCD's and IV pump patient is resting comfortable; however , when attempting to ambulate he had a hard time maintaining his balance as he would tip backwards.  Only ambulated in room to bathroom. Will continue to monitor.

## 2014-01-22 NOTE — Progress Notes (Signed)
01/22/14 1437  Mobility  Activity Ambulate in hall  Level of Assistance Minimal assist, patient does 75% or more  Assistive Device Front wheel walker  Ambulation Response Tolerated well  Range of Motion Active;All extremities    Patient denied any dizziness or headache at this time. Patient tolerate ambulation with one assist. Gait belt utilized. Will conrinue to monitor.

## 2014-01-22 NOTE — Progress Notes (Signed)
Filed Vitals:   01/21/14 2019 01/22/14 0019 01/22/14 0419 01/22/14 0933  BP: 154/75 155/80 138/71 124/63  Pulse: 65 63 58 59  Temp: 99.4 F (37.4 C) 100 F (37.8 C) 99.1 F (37.3 C) 97.9 F (36.6 C)  TempSrc: Oral Oral Oral Oral  Resp: 16 16 14 20   Height:      Weight: 86.4 kg (190 lb 7.6 oz)     SpO2: 95% 98% 96% 95%    Patient resting in bed, sat up for breakfast, ambulated in the hall with the R.N.  Taking by mouth better. I've asked PT and OT to assess whether patient will benefit from CIR. Dressing clean and dry. Awake, alert, oriented to name, [facility] hospital, and 2015. Following commands. Speech fluent. Moving all 4 extremities well. No drift of upper extremities. Accu-Cheks less than 150 for the past 2 days.  Plan: Continue to increase ambulation and activity in the halls. Await PT and OT assessment. We'll DC Accu-Cheks and sliding scale insulin. IV changed to saline lock  Hosie Spangle, MD 01/22/2014, 10:02 AM

## 2014-01-22 NOTE — Progress Notes (Signed)
UR complete.  Renea Schoonmaker RN, MSN 

## 2014-01-22 NOTE — Progress Notes (Signed)
01/22/14 [facility]  Level of Assistance Minimal assist  Mobility  Activity Ambulate in room;Ambulate in hall;Bathroom privileges;Back to bed  Level of Assistance Minimal assist, patient does 75% or more  Assistive Device Front wheel walker  Ambulation Response Tolerated well  Range of Motion Active;All extremities    Patient ambulated in hall way today. This is the third time patient ambulated with Probation officer. He appears to be in no pain. He denied any headache or dizziness. Will continue to monitor.

## 2014-01-23 ENCOUNTER — Inpatient Hospital Stay (HOSPITAL_COMMUNITY): Payer: Medicare HMO

## 2014-01-23 MED ORDER — ACETAMINOPHEN 325 MG PO TABS: 650.0000 mg | ORAL_TABLET | Freq: Four times a day (QID) | ORAL | Status: DC | PRN

## 2014-01-23 NOTE — Care Management Note (Addendum)
  Page 1 of 1   01/23/2014     10:17:58 AM CARE MANAGEMENT NOTE 01/23/2014  Patient:  [name], [name]   Account Number:  [id number]  Date Initiated:  01/21/2014  Documentation initiated by:  Sandi Mariscal  Subjective/Objective Assessment:   SDH evacuation, post op Chest pain     Action/Plan:   await therapy evals to determine next best level of care for patient   Anticipated DC Date:  01/26/2014   Anticipated DC Plan:  SKILLED NURSING FACILITY         Choice offered to / List presented to:             Status of service:  In process, will continue to follow Medicare Important Message given?  YES (If response is "NO", the following Medicare IM given date fields will be blank) Date Medicare IM given:  01/23/2014 Medicare IM given by:  Lorne Skeens Date Additional Medicare IM given:   Additional Medicare IM given by:    Discharge Disposition:    Per UR Regulation:  Reviewed for med. necessity/level of care/duration of stay  If discussed at [place] of Stay Meetings, dates discussed:    Comments:  01/23/14 [place], MSN, CM- Met with patient to provide Medicare IM letter.

## 2014-01-23 NOTE — Progress Notes (Signed)
Filed Vitals:   01/22/14 1806 01/22/14 2240 01/23/14 0240 01/23/14 0525  BP: 146/71 145/66 153/79 158/85  Pulse: 64 70 58 63  Temp: 97.9 F (36.6 C) 99.4 F (37.4 C) 97.5 F (36.4 C) 97.6 F (36.4 C)  TempSrc: Oral Oral Oral Oral  Resp: 20 18 18 20   Height:      Weight:      SpO2: 99% 94% 98% 95%    Patient without complaints. Up and ambulating, seen by PT yesterday. For CT of brain without contrast this morning. Dressing removed, wound healing well. Encouraged patient to ambulate in the halls at least 5 times today.  Plan: Continue postoperative recovery. Await CT results. Continue PT. We'll DC staples in a.m.  Hosie Spangle, MD 01/23/2014, 7:59 AM

## 2014-01-23 NOTE — Progress Notes (Signed)
Occupational Therapy Evaluation Patient Details Name: [name] MRN: [id number] DOB: 18-Mar-1935 Today's Date: 01/23/2014    History of Present Illness [name] is a 79 y.o. Male admitted 01/17/14 for management of worsening Lt sided weakness and clumsiness due to R SDH. Pt is s/p craniotomy for evac on 7/11/5.    Clinical Impression   PTA pt lived at home and was independent with use of cane for ADLs and functional mobility. Pt currently requires supervision for ADLs and mobility for increased safety. Pt reports that his wife will be home 24/7 to assist and education provided to pt on fall prevention and energy conservation techniques. No further acute OT needs.     Follow Up Recommendations  No OT follow up;Supervision/Assistance - 24 hour    Equipment Recommendations  None recommended by OT       Precautions / Restrictions Precautions Precautions: Fall Restrictions Weight Bearing Restrictions: No      Mobility Bed Mobility Overal bed mobility: Modified Independent                Transfers Overall transfer level: Needs assistance Equipment used: Rolling walker (2 wheeled) Transfers: Sit to/from Stand Sit to Stand: Supervision         General transfer comment: Supervision for safety and VC's for hand placement.          ADL Overall ADL's : Needs assistance/impaired Eating/Feeding: Independent;Sitting   Grooming: Standing;Supervision/safety   Upper Body Bathing: Set up;Sitting   Lower Body Bathing: Supervison/ safety;Set up;Sit to/from stand   Upper Body Dressing : Set up;Sitting   Lower Body Dressing: Set up;Supervision/safety;Sit to/from stand   Toilet Transfer: Supervision/safety;Ambulation;Comfort height toilet;RW   Toileting- Clothing Manipulation and Hygiene: Supervision/safety;Sit to/from stand   Tub/ Shower Transfer: Walk-in shower;Supervision/safety;Ambulation;Rolling walker   Functional mobility during ADLs:  Supervision/safety;Rolling walker General ADL Comments: Pt overall moving well, however requires verbal cues for hand placement and safety. Pt would benefit from 24/7 supervision at home for safety. Educated pt on energy conservation due to fatigue when standing/ambulating and fall prevention strategies.      Vision Eye Alignment: Within Functional Limits Alignment/Gaze Preference: Within Defined Limits Ocular Range of Motion: Within Functional Limits Tracking/Visual Pursuits: Able to track stimulus in all quads without difficulty Saccades: Within functional limits Convergence: Within functional limits   Pt reports blurriness when he stands, which resolves quickly.   Pt wears glasses for reading only.      Perception Perception Perception Tested?: No   Praxis Praxis Praxis tested?: Within functional limits    Pertinent Vitals/Pain NAD; pt reports scalp is itching and discouraged pt from scratching over staples.      Hand Dominance Right   Extremity/Trunk Assessment Upper Extremity Assessment Upper Extremity Assessment: Overall WFL for tasks assessed   Lower Extremity Assessment Lower Extremity Assessment: Defer to PT evaluation   Cervical / Trunk Assessment Cervical / Trunk Assessment: Normal   Communication Communication Communication: HOH (has hearing aids)   Cognition Arousal/Alertness: Awake/alert Behavior During Therapy: WFL for tasks assessed/performed Overall Cognitive Status: Within Functional Limits for tasks assessed                                Home Living Family/patient expects to be discharged to:: Private residence Living Arrangements: Spouse/significant other Available Help at Discharge: Family Type of Home: House Home Access: Stairs to enter     Home Layout: Multi-level     Bathroom  Shower/Tub: Walk-in shower;Tub/shower unit Shower/tub characteristics: Architectural technologist: Standard     Home Equipment: Cane - single  point;Walker - 2 wheels;Grab bars - tub/shower          Prior Functioning/Environment Level of Independence: Independent with assistive device(s)        Comments: pt used cane for ambulation                              End of Session Equipment Utilized During Treatment: Gait belt;Rolling walker Nurse Communication: Mobility status  Activity Tolerance: Patient tolerated treatment well Patient left: in bed;with call bell/phone within reach;with bed alarm set   Time: 0920-0939 OT Time Calculation (min): 19 min Charges:  OT General Charges $OT Visit: 1 Procedure OT Evaluation $Initial OT Evaluation Tier I: 1 Procedure OT Treatments $Self Care/Home Management : 8-22 mins  [name] [phone] 01/23/2014, 9:57 AM

## 2014-01-23 NOTE — Progress Notes (Signed)
Physical Therapy Treatment Patient Details Name: [name] MRN: [id number] DOB: 08-11-1934 Today's Date: 01/23/2014    History of Present Illness [name] is a 79 y.o. Male admitted 01/17/14 for management of worsening Lt sided weakness and clumsiness due to R SDH. Pt is s/p craniotomy for evac on 7/11/5.     PT Comments    Progressing well. Stairs and safe gait training have been addressed.  Pt ready for d/c home and to [place].  Follow Up Recommendations  Outpatient PT     Equipment Recommendations       Recommendations for Other Services       Precautions / Restrictions Precautions Precautions: Fall    Mobility  Bed Mobility Overal bed mobility: Modified Independent             General bed mobility comments: fluid movement to EOB  Transfers Overall transfer level: Needs assistance   Transfers: Sit to/from Stand Sit to Stand: Supervision;Min guard (as pt fatigue toward end of treatment he need min guard assi)         General transfer comment: generally supervision for safety except more guard assist as he became more unsteady with fatigue.  Ambulation/Gait Ambulation/Gait assistance: Min guard Ambulation Distance (Feet): 500 Feet Assistive device: Rolling walker (2 wheeled);Straight cane;None Gait Pattern/deviations: Step-through pattern Gait velocity: slower, but able to increase speed appreciably   General Gait Details: mildly unsteady on left with any device.  Most steady with RW, but posture suffers. needs more guard assist without assistive device.  Pt shuffles left foot more and fatigues quickly.  With cane gait is relatively smooth with straighter posture.   Stairs Stairs: Yes Stairs assistance: Min assist Stair Management: One rail Right;Step to pattern;Forwards Number of Stairs: 12 General stair comments: pt tries to alternate steps, but left leg is too weak to  control up or down.  Wheelchair Mobility    Modified Rankin (Stroke  Patients Only)       Balance Overall balance assessment: Needs assistance Sitting-balance support: No upper extremity supported Sitting balance-Leahy Scale: Good     Standing balance support: No upper extremity supported Standing balance-Leahy Scale: Fair                      Cognition Arousal/Alertness: Awake/alert Behavior During Therapy: WFL for tasks assessed/performed Overall Cognitive Status: Within Functional Limits for tasks assessed                      Exercises      General Comments General comments (skin integrity, edema, etc.): Noticeable effect of fatigue on gait stability      Pertinent Vitals/Pain     Home Living                      Prior Function            PT Goals (current goals can now be found in the care plan section) Acute Rehab PT Goals Patient Stated Goal: Back on my Harley PT Goal Formulation: With patient Time For Goal Achievement: 01/29/14 Potential to Achieve Goals: Good Progress towards PT goals: Progressing toward goals    Frequency  Min 3X/week    PT Plan Current plan remains appropriate    Co-evaluation             End of Session   Activity Tolerance: Patient tolerated treatment well Patient left: in bed;with call bell/phone within reach  Time: 8381-8403 PT Time Calculation (min): 25 min  Charges:  $Gait Training: 8-22 mins $Therapeutic Activity: 8-22 mins                    G Codes:      Braeton Wolgamott, Tessie Fass 01/23/2014, 3:18 PM  01/23/2014  Donnella Sham, PT [phone]  (pager)

## 2014-01-24 ENCOUNTER — Other Ambulatory Visit: Payer: Self-pay | Admitting: Neurosurgery

## 2014-01-24 DIAGNOSIS — I6200 Nontraumatic subdural hemorrhage, unspecified: Secondary | ICD-10-CM

## 2014-01-24 MED ORDER — LEVETIRACETAM 500 MG PO TABS
500.0000 mg | ORAL_TABLET | Freq: Two times a day (BID) | ORAL | Status: DC
Start: 2014-01-24 — End: 2014-10-27

## 2014-01-24 MED ORDER — HYDROCODONE-ACETAMINOPHEN 5-325 MG PO TABS: 0.5000 | ORAL_TABLET | ORAL | Status: DC | PRN

## 2014-01-24 NOTE — Progress Notes (Signed)
Staples removed from surgical incisions, intact, with no difficulties. Incision clean, dry, intact, and well approximated. Patient denies pain at site. Will continue to monitor. Verdie Drown RN, BSN

## 2014-01-24 NOTE — Progress Notes (Signed)
Discharge orders received, pt for discharge home today. IV D/C,  D/C instructions and Rx given with verbalized understanding.  Family at bedside to assist pt with discharge. Staff brought pt downstairs via wheelchair.

## 2014-01-24 NOTE — Discharge Summary (Signed)
Physician Discharge Summary  Patient ID: JACK BOLIO MRN: [id number] DOB/AGE: April 08, 1935 79 y.o.  Admit date: 01/17/2014 Discharge date: 01/24/2014  Admission Diagnoses:  right hemispheric subacute subdural hematoma  Discharge Diagnoses:  right hemispheric subacute subdural hematoma  Active Problems:   Subdural hematoma   Discharged Condition: good  Hospital Course: Patient admitted from the emergency room with some left-sided weakness and clumsiness. CT revealed a moderately large right hemispheric subacute subdural hematoma. Patient was taken to surgery for right frontal parietal craniotomy evacuation of subdural hematoma. Postoperatively he has done well. He has been up and ambulating well. Followup CT scan shows only a small amount of residual subdural collection, with significant decrease in mass effect and near complete resolution of shift. He was seen by physical therapy who did not recommend CIR. The patient's been using either a rolling walker or cane while here at the hospital. He has a rolling walker at home. His incision has healed nicely, and the staples were removed. He and his family have been given instructions regarding wound care and activities following discharge. He is to return for followup with me in the office with a CT of the brain without contrast the date of the appointment, his wife is to call my office today to set the appointment to be in 3-4 weeks.  He was started on Keppra 500 mg twice a day for seizure prophylaxis, is being discharged home on Keppra 500 mg twice a day. He's been encouraged to use Tylenol as needed for discomfort, but has been given a prescription for Norco 5/325, half to one tablet every 4 hours when necessary pain. He's been instructed to hold his aspirin 81 mg daily until he sees me back in the office.  On exam he is awake and alert, he is oriented. Following commands, speech fluent. Pupils equal round and reactive to light. EOMI. Facial  movements symmetrical. Moving all 4 extremities well. No drift of upper extremities.  Discharge Exam: Blood pressure 123/78, pulse 71, temperature 98.6 F (37 C), temperature source Oral, resp. rate 16, height 5\' 6"  (1.676 m), weight 86.4 kg (190 lb 7.6 oz), SpO2 96.00%.  Disposition: Home     Medication List    STOP taking these medications       aspirin EC 81 MG tablet      TAKE these medications       atenolol-chlorthalidone 50-25 MG per tablet  Commonly known as:  TENORETIC  Take 1 tablet by mouth daily.     Flax Seed Oil 1000 MG Caps  Take 1 capsule by mouth 2 (two) times daily.     fluticasone 50 MCG/ACT nasal spray  Commonly known as:  FLONASE  Place 2 sprays into both nostrils daily.     HYDROcodone-acetaminophen 5-325 MG per tablet  Commonly known as:  NORCO/VICODIN  Take 0.5-1 tablets by mouth every 4 (four) hours as needed for moderate pain.     isosorbide mononitrate 60 MG 24 hr tablet  Commonly known as:  IMDUR  Take 60 mg by mouth every morning.     levETIRAcetam 500 MG tablet  Commonly known as:  KEPPRA  Take 1 tablet (500 mg total) by mouth every 12 (twelve) hours.     naproxen sodium 220 MG tablet  Commonly known as:  ANAPROX  Take 440 mg by mouth every morning.     nitroGLYCERIN 0.4 MG SL tablet  Commonly known as:  NITROSTAT  Place 0.4 mg under the tongue every 5 (five)  minutes as needed. For chest pain     potassium chloride SA 20 MEQ tablet  Commonly known as:  K-DUR,KLOR-CON  Take 2 tablets (40 mEq total) by mouth 2 (two) times daily.     pravastatin 20 MG tablet  Commonly known as:  PRAVACHOL  Take 20 mg by mouth every evening.         Signed: Hosie Spangle, MD 01/24/2014, 7:44 AM

## 2014-02-19 ENCOUNTER — Ambulatory Visit
Admission: RE | Admit: 2014-02-19 | Discharge: 2014-02-19 | Disposition: A | Discharge status: Home / Self Care | Payer: Medicare HMO | Source: Ambulatory Visit | Attending: Neurosurgery | Admitting: Neurosurgery

## 2014-02-19 DIAGNOSIS — I6200 Nontraumatic subdural hemorrhage, unspecified: Secondary | ICD-10-CM

## 2014-02-25 ENCOUNTER — Telehealth: Payer: Self-pay | Admitting: Family Medicine

## 2014-02-25 DIAGNOSIS — E785 Hyperlipidemia, unspecified: Secondary | ICD-10-CM

## 2014-02-25 DIAGNOSIS — E119 Type 2 diabetes mellitus without complications: Secondary | ICD-10-CM

## 2014-02-25 NOTE — Telephone Encounter (Signed)
Message copied by Jinny Sanders on Tue Feb 25, 2014  3:39 PM ------      Message from: Ellamae Sia      Created: Tue Feb 25, 2014 12:30 PM      Regarding: Lab orders for Friday, 8.21.15       Lab orders for a 3 month f/u ------

## 2014-02-28 ENCOUNTER — Other Ambulatory Visit: Payer: Medicare HMO

## 2014-02-28 ENCOUNTER — Other Ambulatory Visit (INDEPENDENT_AMBULATORY_CARE_PROVIDER_SITE_OTHER): Payer: Medicare HMO

## 2014-02-28 ENCOUNTER — Other Ambulatory Visit: Payer: Self-pay | Admitting: Family Medicine

## 2014-02-28 DIAGNOSIS — E119 Type 2 diabetes mellitus without complications: Secondary | ICD-10-CM

## 2014-02-28 DIAGNOSIS — E785 Hyperlipidemia, unspecified: Secondary | ICD-10-CM

## 2014-02-28 NOTE — Addendum Note (Signed)
Addended by: Marchia Bond on: 02/28/2014 04:32 PM   Modules accepted: Orders

## 2014-03-01 LAB — LIPID PANEL
CHOLESTEROL: 169 mg/dL (ref 0–200)
HDL: 36.8 mg/dL — ABNORMAL LOW (ref >39.00)
LDL Cholesterol: 95 mg/dL (ref 0–99)
NonHDL: 132.2
Total CHOL/HDL Ratio: 5
Triglycerides: 184 mg/dL — ABNORMAL HIGH (ref 0.0–149.0)
VLDL: 36.8 mg/dL (ref 0.0–40.0)

## 2014-03-01 LAB — HEMOGLOBIN A1C: HEMOGLOBIN A1C: 7 % — AB (ref 4.6–6.5)

## 2014-03-03 ENCOUNTER — Other Ambulatory Visit: Payer: Self-pay | Admitting: Cardiovascular Disease

## 2014-03-06 ENCOUNTER — Ambulatory Visit (INDEPENDENT_AMBULATORY_CARE_PROVIDER_SITE_OTHER): Payer: Medicare HMO | Admitting: Neurology

## 2014-03-06 DIAGNOSIS — G909 Disorder of the autonomic nervous system, unspecified: Secondary | ICD-10-CM

## 2014-03-06 NOTE — Procedures (Signed)
[facility] Neurology  [place], [place]  [place], [place] [address] Tel: [phone] Fax:  [phone] Test Date:  03/06/2014  Patient: [name] DOB: 1934-07-30 Physician: Narda Amber, DO  Sex: Male Height: 5\' 8"  Ref Phys: Eliezer Lofts  ID#: [id number] Temp: 32.0C Technician: Laureen Ochs R. NCS T.   Patient Complaints: This is a 79 year old gentleman presenting for evaluation of bilateral feet numbness.  NCV & EMG Findings: Extensive electrodiagnostic testing of the left upper and lower extremity shows:  1. Median sensory response shows prolonged distal peak latency (4.3 ms) and reduced amplitude (7.9 V). The ulnar sensory response is prolonged with preserved amplitude. The radial sensory responses within normal limits.  2. Sural and superficial peroneal sensory responses are absent. 3. Left Median motor nerve showed prolonged distal onset latency (4.4 ms).  The left ulnar motor nerve showed reduced amplitude (5.2 mV) and decreased conduction velocity (A Elbow-B Elbow, 42 m/s).  4. The left peroneal motor a shows reduced amplitude recording at the extensor digitorum brevis, however normal response recording at the tibialis anterior. The tibial motor response is also reduced in amplitude.  5. In the upper extremity, chronic motor axon loss changes are seen affecting the ulnar innervated muscles with mild active changes isolated to the first dorsal interosseous muscle. 6. In the lower extremity, mild chronic motor axon loss changes are seen affecting muscles below the knee.   Impression: 1. There is electrophysiologic evidence of a generalized sensorimotor polyneuropathy affecting the left side, moderate in degree electrically. 2. There is a superimposed left median neuropathy at the wrist consistent with the clinical diagnosis of carpal tunnel syndrome. Overall, these findings are moderate in degree electrically. 3. Left ulnar neuropathy with slowing at the elbow,  demyelinating and axon loss in type. Overall, these findings are mild-to-moderate in degree electrically. 4. There is no evidence of a cervical or lumbosacral radiculopathy affecting the left side.   ___________________________ Narda Amber, DO    Nerve Conduction Studies Anti Sensory Summary Table   Site NR Peak (ms) Norm Peak (ms) P-T Amp (V) Norm P-T Amp  Left Median Anti Sensory (2nd Digit)  32C  Wrist    4.3 <3.8 7.9 >10  Left Radial Anti Sensory (Base 1st Digit)  32C  Wrist    2.3 <2.8 18.6 >10  Left Sup Peroneal Anti Sensory (Ant Lat Mall)  12 cm NR  <4.6  >3  Left Sural Anti Sensory (Lat Mall)  Calf NR  <4.6  >3  Left Ulnar Anti Sensory (5th Digit)  32C  Wrist    4.9 <3.2 6.5 >5   Motor Summary Table   Site NR Onset (ms) Norm Onset (ms) O-P Amp (mV) Norm O-P Amp Site1 Site2 Delta-0 (ms) Dist (cm) Vel (m/s) Norm Vel (m/s)  Left Median Motor (Abd Poll Brev)  32C  Wrist    4.4 <4.0 6.2 >5 Elbow Wrist 5.4 27.0 50 >50  Elbow    9.8  4.6         Left Peroneal Motor (Ext Dig Brev)  Ankle    4.0 <6.0 1.3 >2.5 B Fib Ankle 6.6 29.0 44 >40  B Fib    10.6  1.3  Poplt B Fib 2.4 10.0 42 >40  Poplt    13.0  1.3         Post Exercise    3.8  1.7         Left Peroneal TA Motor (Tib Ant)  Fib Head  3.4 <4.5 4.5 >3 Poplit Fib Head 1.7 9.0 53 >40  Poplit    5.1  4.4         Left Tibial Motor (Abd Hall Brev)  Ankle    4.0 <6.0 0.5 >4 Knee Ankle 8.5 38.0 45 >40  Knee    12.5  0.6         Left Ulnar Motor (Abd Dig Minimi)  32C  Wrist    2.3 <3.1 5.2 >7 B Elbow Wrist 4.3 22.5 52 >50  B Elbow    6.6  4.1  A Elbow B Elbow 2.4 10.0 42 >50  A Elbow    9.0  3.9          F Wave Studies   NR F-Lat (ms) Lat Norm (ms) L-R F-Lat (ms)  Left Tibial (Mrkrs) (Abd Hallucis)  NR  <55   Left Ulnar (Mrkrs) (Abd Dig Min)  32C     32.53 <33    EMG   Side Muscle Ins Act Fibs Psw Fasc Number Recrt Dur Dur. Amp Amp. Poly Poly. Comment  Left 1stDorInt Nml 1+ Nml Nml 2- Rapid Some 1+ Some 1+  Nml Nml N/A  Left ABD Dig Min Nml Nml Nml Nml 2- Rapid Some 1+ Some 1+ Nml Nml N/A  Left Ext Indicis Nml Nml Nml Nml Nml Nml Nml Nml Nml Nml Nml Nml N/A  Left Abd Poll Brev Nml Nml Nml Nml Nml Nml Nml Nml Nml Nml Nml Nml N/A  Left FlexPolLong Nml Nml Nml Nml Nml Nml Nml Nml Nml Nml Nml Nml N/A  Left PronatorTeres Nml Nml Nml Nml Nml Nml Nml Nml Nml Nml Nml Nml N/A  Left FlexDigProf 4,5 Nml Nml Nml Nml 1- Mod-R Some 1+ Nml Nml Nml Nml N/A  Left Biceps Nml Nml Nml Nml Nml Nml Nml Nml Nml Nml Nml Nml N/A  Left Triceps Nml Nml Nml Nml Nml Nml Nml Nml Nml Nml Nml Nml N/A  Left RectFemoris Nml Nml Nml Nml Nml Nml Nml Nml Nml Nml Nml Nml N/A  Left GluteusMed Nml Nml Nml Nml Nml Nml Nml Nml Nml Nml Nml Nml N/A  Left AntTibialis Nml Nml Nml Nml 1- Mod-R Few 1+ Nml Nml Nml Nml N/A  Left Gastroc Nml Nml Nml Nml Nml Nml Nml Nml Nml Nml Nml Nml N/A  Left Flex Dig Long Nml Nml Nml Nml 1- Rapid Some 1+ Nml Nml Nml Nml N/A  Left BicepsFemS Nml Nml Nml Nml Nml Nml Nml Nml Nml Nml Nml Nml N/A  Waveforms:

## 2014-03-07 ENCOUNTER — Ambulatory Visit (INDEPENDENT_AMBULATORY_CARE_PROVIDER_SITE_OTHER): Payer: Medicare HMO | Admitting: Family Medicine

## 2014-03-07 ENCOUNTER — Encounter: Payer: Self-pay | Admitting: Family Medicine

## 2014-03-07 VITALS — BP 126/68 | HR 75 | Temp 97.9°F | Ht 66.5 in | Wt 187.2 lb

## 2014-03-07 DIAGNOSIS — E119 Type 2 diabetes mellitus without complications: Secondary | ICD-10-CM

## 2014-03-07 DIAGNOSIS — R209 Unspecified disturbances of skin sensation: Secondary | ICD-10-CM

## 2014-03-07 DIAGNOSIS — E785 Hyperlipidemia, unspecified: Secondary | ICD-10-CM

## 2014-03-07 DIAGNOSIS — R200 Anesthesia of skin: Secondary | ICD-10-CM

## 2014-03-07 DIAGNOSIS — R29898 Other symptoms and signs involving the musculoskeletal system: Secondary | ICD-10-CM

## 2014-03-07 DIAGNOSIS — M545 Low back pain, unspecified: Secondary | ICD-10-CM

## 2014-03-07 DIAGNOSIS — G629 Polyneuropathy, unspecified: Secondary | ICD-10-CM

## 2014-03-07 DIAGNOSIS — G609 Hereditary and idiopathic neuropathy, unspecified: Secondary | ICD-10-CM

## 2014-03-07 MED ORDER — TRAMADOL HCL 50 MG PO TABS: 50.0000 mg | ORAL_TABLET | Freq: Three times a day (TID) | ORAL | Status: DC | PRN

## 2014-03-07 NOTE — Progress Notes (Signed)
Pre visit review using our clinic review tool, if applicable. No additional management support is needed unless otherwise documented below in the visit note. 

## 2014-03-07 NOTE — Patient Instructions (Signed)
Stop at front desk to set up referral for MRI lumbar spine.  Can use tramadol for pain as needed.

## 2014-03-07 NOTE — Progress Notes (Signed)
   Subjective:    Patient ID: [name], male    DOB: August 30, 1934, 79 y.o.   MRN: [id number]  HPI  79 year old male with DM presents for 3 month follow up cholesterol and DM.  As well as ER follow up.  [place] and hit his head in May from dizziness. Had weakness in left leg in July. Went to ER had subdural bleed. Had brain surgery on 01/18/2014 with Dr. Sherwood Gambler who removed it Weakness on left side has resolved. Has follow up in 1 month.   Dizziness has resolved.    He is on pravastatin 20 mg daily.  Goal LD < 70 given CAD Lab Results  Component Value Date   CHOL 169 02/28/2014   HDL 36.80* 02/28/2014   LDLCALC 95 02/28/2014   TRIG 184.0* 02/28/2014   CHOLHDL 5 02/28/2014    Diabetes: moderate control on no med. Improved from last check Lab Results  Component Value Date   HGBA1C 7.0* 02/28/2014  Using medications without difficulties: Hypoglycemic episodes: Hyperglycemic episodes: Feet problems: Blood Sugars averaging: eye exam within last year:  Neuropathy: nerve conduction showed  Upper and lower moderate polyneuropathy.  Numbness in feet continues.  Bilateral leg weakness. Continue low back pain, more severe now. Using tylenol for pain, helps some off and on. NSAID contraindicated with recent subdural bleed.  Sitting hurts back, walking. Sleeps in recliner.   no past back surgeries.   X-ray: 07/2012:  IMPRESSION:  1. Stable grade 1 retrolisthesis of L1 on L2 and stable grade 1  anterolisthesis of L4 on L5. These findings may be related to  chronic facet joint degenerative changes. No pars defects are  identified.  2. No significant acute bony abnormality or degenerative change  identified compared to lumbar spine radiographs of 05/18/2010.      Review of Systems  Constitutional: Negative for fatigue.  Eyes: Negative for pain.  Respiratory: Negative for shortness of breath.   Cardiovascular: Negative for chest pain, palpitations and leg swelling.    Gastrointestinal: Negative for abdominal pain.  Musculoskeletal: Positive for back pain.  Neurological: Positive for weakness and numbness.       Objective:   Physical Exam  Constitutional: Vital signs are normal. He appears well-developed and well-nourished.  HENT:  Head: Normocephalic.  Right Ear: Hearing normal.  Left Ear: Hearing normal.  Nose: Nose normal.  Mouth/Throat: Oropharynx is clear and moist and mucous membranes are normal.  Neck: Trachea normal. Carotid bruit is not present. No mass and no thyromegaly present.  Cardiovascular: Normal rate, regular rhythm and normal pulses.  Exam reveals no gallop, no distant heart sounds and no friction rub.   No murmur heard. No peripheral edema  Pulmonary/Chest: Effort normal and breath sounds normal. No respiratory distress.  Musculoskeletal:       Lumbar back: He exhibits decreased range of motion and tenderness. He exhibits no bony tenderness.  Neurological: He has normal strength. He displays no tremor. A sensory deficit is present. No cranial nerve deficit. He exhibits normal muscle tone.  Skin: Skin is warm, dry and intact. No rash noted.  Healing wound on right lateral head  Psychiatric: He has a normal mood and affect. His speech is normal and behavior is normal. Thought content normal.          Assessment & Plan:

## 2014-03-07 NOTE — Assessment & Plan Note (Signed)
Improved control with lifestyle changes. No med needed at this time. Will recheck in 3 months,

## 2014-03-07 NOTE — Assessment & Plan Note (Signed)
Not at goal. Will hold off on med change at this point given multiple other med issues.  Encouraged exercise, weight loss, healthy eating habits.

## 2014-03-07 NOTE — Assessment & Plan Note (Signed)
Given nonspecific cahnges on Nerve cond.as well as continued low back apin, leg weakness and numbness. Will eval MRI for spinal stenosis. Tramadol for pain.

## 2014-03-07 NOTE — Assessment & Plan Note (Signed)
UNclear cause.. Polyneuropathy.  If MRi spine nml will refer to neurologist.

## 2014-03-10 ENCOUNTER — Other Ambulatory Visit: Payer: Medicare HMO

## 2014-03-11 ENCOUNTER — Ambulatory Visit
Admission: RE | Admit: 2014-03-11 | Discharge: 2014-03-11 | Disposition: A | Discharge status: Home / Self Care | Payer: Medicare HMO | Source: Ambulatory Visit | Attending: Family Medicine | Admitting: Family Medicine

## 2014-03-11 DIAGNOSIS — R29898 Other symptoms and signs involving the musculoskeletal system: Secondary | ICD-10-CM

## 2014-03-11 DIAGNOSIS — M545 Low back pain: Secondary | ICD-10-CM

## 2014-03-11 DIAGNOSIS — R200 Anesthesia of skin: Secondary | ICD-10-CM

## 2014-03-12 ENCOUNTER — Other Ambulatory Visit: Payer: Medicare HMO

## 2014-04-09 ENCOUNTER — Other Ambulatory Visit: Payer: Self-pay

## 2014-04-09 MED ORDER — ISOSORBIDE MONONITRATE ER 60 MG PO TB24: 60.0000 mg | ORAL_TABLET | ORAL | Status: DC

## 2014-04-10 ENCOUNTER — Other Ambulatory Visit: Payer: Self-pay | Admitting: Neurosurgery

## 2014-04-10 DIAGNOSIS — S065XAA Traumatic subdural hemorrhage with loss of consciousness status unknown, initial encounter: Secondary | ICD-10-CM

## 2014-04-10 DIAGNOSIS — S065X9A Traumatic subdural hemorrhage with loss of consciousness of unspecified duration, initial encounter: Secondary | ICD-10-CM

## 2014-05-06 ENCOUNTER — Other Ambulatory Visit: Payer: Self-pay | Admitting: *Deleted

## 2014-05-06 ENCOUNTER — Other Ambulatory Visit: Payer: Self-pay | Admitting: Cardiovascular Disease

## 2014-05-06 MED ORDER — ATENOLOL-CHLORTHALIDONE 50-25 MG PO TABS: ORAL_TABLET | ORAL | Status: DC

## 2014-05-06 MED ORDER — POTASSIUM CHLORIDE CRYS ER 20 MEQ PO TBCR: EXTENDED_RELEASE_TABLET | ORAL | Status: DC

## 2014-05-06 MED ORDER — PRAVASTATIN SODIUM 20 MG PO TABS: 20.0000 mg | ORAL_TABLET | Freq: Every evening | ORAL | Status: DC

## 2014-05-23 ENCOUNTER — Ambulatory Visit
Admission: RE | Admit: 2014-05-23 | Discharge: 2014-05-23 | Disposition: A | Discharge status: Home / Self Care | Payer: Medicare HMO | Source: Ambulatory Visit | Attending: Neurosurgery | Admitting: Neurosurgery

## 2014-05-23 DIAGNOSIS — S065XAA Traumatic subdural hemorrhage with loss of consciousness status unknown, initial encounter: Secondary | ICD-10-CM

## 2014-05-23 DIAGNOSIS — S065X9A Traumatic subdural hemorrhage with loss of consciousness of unspecified duration, initial encounter: Secondary | ICD-10-CM

## 2014-06-10 ENCOUNTER — Ambulatory Visit: Payer: Medicare HMO | Admitting: Family Medicine

## 2014-06-19 ENCOUNTER — Encounter (HOSPITAL_COMMUNITY): Payer: Self-pay | Admitting: Cardiovascular Disease

## 2014-08-26 ENCOUNTER — Other Ambulatory Visit: Payer: Self-pay

## 2014-08-26 MED ORDER — NITROGLYCERIN 0.4 MG SL SUBL: 0.4000 mg | SUBLINGUAL_TABLET | SUBLINGUAL | Status: DC | PRN

## 2014-08-26 MED ORDER — ISOSORBIDE MONONITRATE ER 60 MG PO TB24: 60.0000 mg | ORAL_TABLET | ORAL | Status: DC

## 2014-08-26 MED ORDER — PRAVASTATIN SODIUM 20 MG PO TABS: 20.0000 mg | ORAL_TABLET | Freq: Every evening | ORAL | Status: DC

## 2014-08-26 MED ORDER — ATENOLOL-CHLORTHALIDONE 50-25 MG PO TABS: ORAL_TABLET | ORAL | Status: DC

## 2014-08-26 MED ORDER — POTASSIUM CHLORIDE CRYS ER 20 MEQ PO TBCR: EXTENDED_RELEASE_TABLET | ORAL | Status: DC

## 2014-10-27 ENCOUNTER — Telehealth: Payer: Self-pay | Admitting: Cardiovascular Disease

## 2014-10-27 ENCOUNTER — Ambulatory Visit (INDEPENDENT_AMBULATORY_CARE_PROVIDER_SITE_OTHER): Payer: Medicare HMO | Admitting: Nurse Practitioner

## 2014-10-27 ENCOUNTER — Other Ambulatory Visit: Payer: Self-pay | Admitting: Nurse Practitioner

## 2014-10-27 ENCOUNTER — Encounter: Payer: Self-pay | Admitting: Nurse Practitioner

## 2014-10-27 VITALS — BP 130/72 | HR 60 | Ht 67.0 in | Wt 193.6 lb

## 2014-10-27 DIAGNOSIS — I209 Angina pectoris, unspecified: Secondary | ICD-10-CM

## 2014-10-27 DIAGNOSIS — I259 Chronic ischemic heart disease, unspecified: Secondary | ICD-10-CM

## 2014-10-27 LAB — BASIC METABOLIC PANEL
BUN: 22 mg/dL (ref 6–23)
CO2: 27 mEq/L (ref 19–32)
Calcium: 9.4 mg/dL (ref 8.4–10.5)
Chloride: 105 mEq/L (ref 96–112)
Creatinine, Ser: 0.87 mg/dL (ref 0.40–1.50)
GFR: 90.24 mL/min (ref >60.00)
Glucose, Bld: 108 mg/dL — ABNORMAL HIGH (ref 70–99)
Potassium: 3.5 mEq/L (ref 3.5–5.1)
Sodium: 139 mEq/L (ref 135–145)

## 2014-10-27 LAB — CBC
HCT: 40 % (ref 39.0–52.0)
Hemoglobin: 13.5 g/dL (ref 13.0–17.0)
MCHC: 33.8 g/dL (ref 30.0–36.0)
MCV: 86.3 fl (ref 78.0–100.0)
Platelets: 249 10*3/uL (ref 150.0–400.0)
RBC: 4.64 Mil/uL (ref 4.22–5.81)
RDW: 14.1 % (ref 11.5–15.5)
WBC: 7.7 10*3/uL (ref 4.0–10.5)

## 2014-10-27 LAB — PROTIME-INR
INR: 1 ratio (ref 0.8–1.0)
Prothrombin Time: 11.5 s (ref 9.6–13.1)

## 2014-10-27 MED ORDER — ISOSORBIDE MONONITRATE ER 60 MG PO TB24: 90.0000 mg | ORAL_TABLET | ORAL | Status: DC

## 2014-10-27 NOTE — Telephone Encounter (Signed)
Reviewed with Truitt Merle, NP and she can see [name] today at 1:30. I spoke with [name] and he will be here for this appt.

## 2014-10-27 NOTE — Telephone Encounter (Signed)
Thanks

## 2014-10-27 NOTE — Progress Notes (Addendum)
CARDIOLOGY OFFICE NOTE  Date:  10/27/2014    [name] Date of Birth: 1935/02/16 Medical Record #[id number]  PCP:  Eliezer Lofts, MD  Cardiologist:  [facility]    Chief Complaint  Patient presents with  . Chest Pain    Work in visit - seen for Dr. Angelena Form     History of Present Illness: [name] is a 80 y.o. male who presents today for a work in visit. Seen for Dr. Angelena Form. He has a history of CAD s/p placement of drug eluting stent in mid LAD in January of 2005 with residual disease in proximal LAD and RCA, HTN, HLD and GERD.  Cardiac cath 07/27/11 showed stable moderate CAD with patent LAD stent. He has not tolerated statins in the past secondary to myalgias.   He had a SDH requiring evacuation by Dr. Sherwood Gambler in July 2015 with resolution. He was taken off aspirin.   Last seen here in June of 2015. Chronic but stable angina with heavy exertion noted.   Phone call today -   Spoke with pt. He reports Dr. Angelena Form told him to call if chest pain worsens. Pt states he has been having chest pain with exertion for last couple of weeks. This is a change from last office visit. Occurs with walking and goes away with rest. Has used NTG once and it helped pain. No pain at present time but has not been exerting himself. Pt can come in today for office visit. Will review with provider in office.          Thus added to the FLEX today.   Comes in today. Here with his wife. He notes that for the past several weeks he has had progressive chest pain - coming more often with less activity. Resolved with rest. Notes that it is getting worse. Did take a NTG on Friday with prompt relief. Short of breath with the chest pain. No other associated symptoms. No spells at rest.  He had a subdural hematoma following a fall back in July of 2015 - required evacuation. Aspirin was stopped. The patient restarted this several months ago. Patient stopped his Keppra - not sure when. No seizures.     Past Medical History  Diagnosis Date  . Coronary atherosclerosis of unspecified type of vessel, native or graft     s/p stent mid LAD 2005  . DJD (degenerative joint disease)   . GERD (gastroesophageal reflux disease)   . DM (diabetes mellitus)   . HTN (hypertension)   . Hyperlipidemia     Past Surgical History  Procedure Laterality Date  . Ptca      Hx of it.   . Knee surgery    . Kidney stone removal    . Sigmoidoscopy    . Total knee arthroplasty  02/2011    Right knee  . Craniotomy N/A 01/18/2014    Procedure: CRANIOTOMY HEMATOMA EVACUATION SUBDURAL;  Surgeon: Hosie Spangle, MD;  Location: [facility];  Service: Neurosurgery;  Laterality: N/A;  . Left heart catheterization with coronary angiogram N/A 07/27/2011    Procedure: LEFT HEART CATHETERIZATION WITH CORONARY ANGIOGRAM;  Surgeon: Burnell Blanks, MD;  Location: [facility] CATH LAB;  Service: Cardiovascular;  Laterality: N/A;     Medications: Current Outpatient Prescriptions  Medication Sig Dispense Refill  . acetaminophen (TYLENOL) 500 MG tablet Take 1,000 mg by mouth 2 (two) times daily.    Marland Kitchen aspirin EC 81 MG tablet Take 81 mg by mouth  daily.    . atenolol-chlorthalidone (TENORETIC) 50-25 MG per tablet TAKE 1 TABLET DAILY (Patient taking differently: TAKE 1 TABLET BY MOUTH DAILY) 90 tablet 1  . Flaxseed, Linseed, (FLAX SEED OIL) 1000 MG CAPS Take 1 capsule by mouth 2 (two) times daily.      . fluticasone (FLONASE) 50 MCG/ACT nasal spray Place 2 sprays into both nostrils daily as needed for allergies or rhinitis.    Marland Kitchen isosorbide mononitrate (IMDUR) 60 MG 24 hr tablet Take 1.5 tablets (90 mg total) by mouth every morning. 90 tablet 1  . nitroGLYCERIN (NITROSTAT) 0.4 MG SL tablet Place 1 tablet (0.4 mg total) under the tongue every 5 (five) minutes as needed. For chest pain 25 tablet 3  . potassium chloride SA (KLOR-CON M20) 20 MEQ tablet TAKE 1 TABLET TWICE A DAY (Patient taking differently: TAKE 1 TABLET BY MOUTH TWICE A  DAY) 180 tablet 1  . pravastatin (PRAVACHOL) 20 MG tablet Take 1 tablet (20 mg total) by mouth every evening. 90 tablet 1  . traMADol (ULTRAM) 50 MG tablet Take 1 tablet (50 mg total) by mouth every 8 (eight) hours as needed. (Patient taking differently: Take 50 mg by mouth every 8 (eight) hours as needed (PAIN). ) 30 tablet 0   No current facility-administered medications for this visit.    Allergies: Allergies  Allergen Reactions  . Statins Other (See Comments)    Caused soreness (Lipitor, Zocor)    Social History: The patient  reports that he has quit smoking. He has never used smokeless tobacco. He reports that he does not drink alcohol or use illicit drugs.   Family History: The patient's family history includes COPD in his mother; Heart disease in his father and sister; Heart failure in his mother.   Review of Systems: Please see the history of present illness.   Otherwise, the review of systems is positive for chest pain.   All other systems are reviewed and negative.   Physical Exam: VS:  BP 130/72 mmHg  Pulse 60  Ht 5\' 7"  (1.702 m)  Wt 193 lb 9.6 oz (87.816 kg)  BMI 30.31 kg/m2 .  BMI Body mass index is 30.31 kg/(m^2).  Wt Readings from Last 3 Encounters:  10/27/14 193 lb 9.6 oz (87.816 kg)  03/07/14 187 lb 4 oz (84.936 kg)  01/07/14 188 lb (85.276 kg)    General: Pleasant. Well developed, well nourished and in no acute distress.  HEENT: Normal. Neck: Supple, no JVD, carotid bruits, or masses noted.  Cardiac: Regular rate and rhythm. No murmurs, rubs, or gallops. No edema.  Respiratory:  Lungs are clear to auscultation bilaterally with normal work of breathing.  GI: Soft and nontender.  MS: No deformity or atrophy. Gait and ROM intact. Skin: Warm and dry. Color is normal.  Neuro:  Strength and sensation are intact and no gross focal deficits noted.  Psych: Alert, appropriate and with normal affect.   LABORATORY DATA:  EKG:  EKG is ordered today. This  demonstrates NSR - unchanged  Lab Results  Component Value Date   WBC 14.1* 01/19/2014   HGB 12.2* 01/19/2014   HCT 36.8* 01/19/2014   PLT 212 01/19/2014   GLUCOSE 155* 01/19/2014   GLUCOSE 156* 01/19/2014   CHOL 169 02/28/2014   TRIG 184.0* 02/28/2014   HDL 36.80* 02/28/2014   LDLCALC 95 02/28/2014   ALT 15 01/19/2014   AST 15 01/19/2014   NA 142 01/19/2014   NA 142 01/19/2014   K 3.7 01/19/2014  K 3.7 01/19/2014   CL 103 01/19/2014   CL 103 01/19/2014   CREATININE 0.76 01/19/2014   CREATININE 0.78 01/19/2014   BUN 14 01/19/2014   BUN 15 01/19/2014   CO2 22 01/19/2014   CO2 21 01/19/2014   TSH 0.827 01/19/2014   PSA 0.99 11/28/2006   INR 0.96 01/18/2014   HGBA1C 7.0* 02/28/2014    BNP (last 3 results) No results for input(s): BNP in the last 8760 hours.  ProBNP (last 3 results) No results for input(s): PROBNP in the last 8760 hours.   Other Studies Reviewed Today:   Cardiac Catheterization Operative Report  TRAYVON TRUMBULL [id number] 1/16/201310:47 AM Adella Hare, MD, MD  Procedure Performed:  1. Left Heart Catheterization 2. Selective Coronary Angiography 3. Left ventricular angiogram  Operator: Lauree Chandler, MD  Arterial access site: Right radial artery.   Indication: Known CAD, chest pain.   Procedure Details: The risks, benefits, complications, treatment options, and expected outcomes were discussed with the patient. The patient and/or family concurred with the proposed plan, giving informed consent. The patient was brought to the cath lab after IV hydration was begun and oral premedication was given. The patient was further sedated with Versed and Fentanyl. The right wrist was assessed with an Allens test which was positive. The right wrist was prepped and draped in a sterile fashion. 1% lidocaine was used for local anesthesia. Using the modified Seldinger access technique, a 5 French sheath was placed  in the right radial artery. 3 mg Verapamil was given through the sheath. 4000 units IV heparin was given. Standard diagnostic catheters were used to perform selective coronary angiography. A pigtail catheter was used to perform a left ventricular angiogram. The sheath was removed from the right radial artery and a Terumo hemostasis band was applied at the arteriotomy site on the right wrist.   There were no immediate complications. The patient was taken to the recovery area in stable condition.   Hemodynamic Findings: Central aortic pressure: 97/52 Left ventricular pressure: 102/3/16  Angiographic Findings:  Left main: Distal 10% stenosis.   Left Anterior Descending Artery: Proximal with diffuse 20% stenosis. 40% mid stenosis prior to mid stent. The stent has moderate, 40% restenosis that does not appear to be flow limiting. The distal vessel has several 60% stenoses which do not appear to be flow limiting. The first diagonal is small in caliber and has ostial 50% stenosis. The second diagonal is jailed by the stent and has an ostial   Circumflex Artery: Large caliber vessel. 40% mid stenosis.   Right Coronary Artery: 40% mid stenosis. 30% distal stenosis. 50% PDA stenosis.   Left Ventricular Angiogram: LVEF=55%.   Impression: 1. Moderate CAD 2. Patent stent mid LAD with moderate restenosis 3. Normal LV systolic function   Assessment/Plan: 1. Chest pain - known CAD - sounds like angina. Will increase his Imdur to 90 mg. Arrange for cardiac cath with Dr. Burt Knack (Dr. Angelena Form not available). The patient understands that risks include but are not limited to stroke (1 in 1000), death (1 in 50), kidney failure [usually temporary] (1 in 500), bleeding (1 in 200), allergic reaction [possibly serious] (1 in 200), and agrees to proceed.   He is to limit his activities in the interim.   2. HTN -  BP is ok on current regimen  3. HLD - statin intolerant but on Pravachol  4. Prior  subdural hematoma - patient has restarted his aspirin several months ago. SDH resolved on last CT.  Will discuss with Dr. Sherwood Gambler.   Current medicines are reviewed with the patient today.  The patient does not have concerns regarding medicines other than what has been noted above.  The following changes have been made:  See above.  Labs/ tests ordered today include:    Orders Placed This Encounter  Procedures  . Basic metabolic panel  . CBC  . Protime-INR  . APTT  . EKG 12-Lead     Disposition:   Further disposition to follow.   Patient is agreeable to this plan and will call if any problems develop in the interim.   Signed: Burtis Junes, RN, ANP-C 10/27/2014 2:18 PM  [facility] [address] [place], [place]  [address] Phone: [phone] Fax: [phone]       Addendum: Called Dr. Sherwood Gambler to update - have left message for him to call back to discuss.

## 2014-10-27 NOTE — Telephone Encounter (Signed)
Pt c/o of Chest Pain: 1. Are you having CP right now? no 2. Are you experiencing any other symptoms (ex. SOB, nausea, vomiting, sweating)? no 3. How long have you been experiencing CP? 2 weeks 4. Is your CP continuous or coming and going? Coming and going 5. Have you taken Nitroglycerin? Yes 3 days ago and it seem to help

## 2014-10-27 NOTE — Patient Instructions (Signed)
Medication Instructions:  Increase your Imdur to 90 mg a day (a pill and a half)  Labwork:  We will be checking the following labs today BEMT, CBC, PT, PTT   Testing/Procedures:  Your provider has recommended a cardiac catherization  You are scheduled for a cardiac catheterization on Friday, April 22th at 11:30am with Dr. Burt Knack or associate.  Go to [facility] 2nd Floor Short Stay on Friday, April 22nd at 9:30Am .  Enter thru the [employer] entrance A No food or drink after midnight on Thursday You may take your medications with a sip of water on the day of your procedure.   Coronary Angiogram A coronary angiogram, also called coronary angiography, is an X-ray procedure used to look at the arteries in the heart. In this procedure, a dye (contrast dye) is injected through a long, hollow tube (catheter). The catheter is about the size of a piece of cooked spaghetti and is inserted through your groin, wrist, or arm. The dye is injected into each artery, and X-rays are then taken to show if there is a blockage in the arteries of your heart.  LET [facility] CARE PROVIDER KNOW ABOUT:  Any allergies you have, including allergies to shellfish or contrast dye.   All medicines you are taking, including vitamins, herbs, eye drops, creams, and over-the-counter medicines.   Previous problems you or members of your family have had with the use of anesthetics.   Any blood disorders you have.   Previous surgeries you have had.  History of kidney problems or failure.   Other medical conditions you have.  RISKS AND COMPLICATIONS  Generally, a coronary angiogram is a safe procedure. However, about 1 person out of 1000 can have problems that may include:  Allergic reaction to the dye.  Bleeding/bruising from the access site or other locations.  Kidney injury, especially in people with impaired kidney function.  Stroke (rare).  Heart attack (rare).  Irregular rhythms  (rare)  Death (rare)  BEFORE THE PROCEDURE   Do not eat or drink anything after midnight the night before the procedure or as directed by your health care provider.   Ask your health care provider about changing or stopping your regular medicines. This is especially important if you are taking diabetes medicines or blood thinners.  PROCEDURE  You may be given a medicine to help you relax (sedative) before the procedure. This medicine is given through an intravenous (IV) access tube that is inserted into one of your veins.   The area where the catheter will be inserted will be washed and shaved. This is usually done in the groin but may be done in the fold of your arm (near your elbow) or in the wrist.   A medicine will be given to numb the area where the catheter will be inserted (local anesthetic).   The health care provider will insert the catheter into an artery. The catheter will be guided by using a special type of X-ray (fluoroscopy) of the blood vessel being examined.   A special dye will then be injected into the catheter, and X-rays will be taken. The dye will help to show where any narrowing or blockages are located in the heart arteries.    AFTER THE PROCEDURE   If the procedure is done through the leg, you will be kept in bed lying flat for several hours. You will be instructed to not bend or cross your legs.  The insertion site will be checked frequently.  The pulse in your feet or wrist will be checked frequently.   Additional blood tests, X-rays, and an electrocardiogram may be done.     Call the [place] office at [phone] if you have any questions, problems or concerns.

## 2014-10-27 NOTE — Telephone Encounter (Signed)
Spoke with pt. He reports Dr. Angelena Form told him to call if chest pain worsens.  Pt states he has been having chest pain with exertion for last couple of weeks.  This is a change from last office visit. Occurs with walking and goes away with rest. Has used NTG once and it helped pain.  No pain at present time but has not been exerting himself.  Pt can come in today for office visit.  Will review with provider in office.

## 2014-10-28 LAB — APTT: aPTT: 30.4 s (ref 23.4–32.7)

## 2014-10-31 ENCOUNTER — Ambulatory Visit (HOSPITAL_COMMUNITY): Payer: Medicare HMO

## 2014-10-31 ENCOUNTER — Other Ambulatory Visit: Payer: Self-pay | Admitting: *Deleted

## 2014-10-31 ENCOUNTER — Inpatient Hospital Stay (HOSPITAL_COMMUNITY): Payer: Medicare HMO

## 2014-10-31 ENCOUNTER — Inpatient Hospital Stay (HOSPITAL_COMMUNITY)
Admission: RE | Admit: 2014-10-31 | Discharge: 2014-11-09 | DRG: 234 | Disposition: A | Discharge status: Home / Self Care | Payer: Medicare HMO | Source: Ambulatory Visit | Attending: Cardiothoracic Surgery | Admitting: Cardiothoracic Surgery

## 2014-10-31 ENCOUNTER — Encounter (HOSPITAL_COMMUNITY): Payer: Self-pay | Admitting: *Deleted

## 2014-10-31 ENCOUNTER — Encounter (HOSPITAL_COMMUNITY)
Admission: RE | Disposition: A | Discharge status: Home / Self Care | Payer: Self-pay | Source: Ambulatory Visit | Attending: Cardiothoracic Surgery

## 2014-10-31 DIAGNOSIS — T82857A Stenosis of cardiac prosthetic devices, implants and grafts, initial encounter: Secondary | ICD-10-CM | POA: Diagnosis present

## 2014-10-31 DIAGNOSIS — Z87891 Personal history of nicotine dependence: Secondary | ICD-10-CM

## 2014-10-31 DIAGNOSIS — Z7982 Long term (current) use of aspirin: Secondary | ICD-10-CM

## 2014-10-31 DIAGNOSIS — Z951 Presence of aortocoronary bypass graft: Secondary | ICD-10-CM

## 2014-10-31 DIAGNOSIS — Z96651 Presence of right artificial knee joint: Secondary | ICD-10-CM | POA: Diagnosis present

## 2014-10-31 DIAGNOSIS — D62 Acute posthemorrhagic anemia: Secondary | ICD-10-CM | POA: Diagnosis not present

## 2014-10-31 DIAGNOSIS — K219 Gastro-esophageal reflux disease without esophagitis: Secondary | ICD-10-CM | POA: Diagnosis present

## 2014-10-31 DIAGNOSIS — E876 Hypokalemia: Secondary | ICD-10-CM | POA: Diagnosis present

## 2014-10-31 DIAGNOSIS — R079 Chest pain, unspecified: Secondary | ICD-10-CM | POA: Diagnosis present

## 2014-10-31 DIAGNOSIS — E871 Hypo-osmolality and hyponatremia: Secondary | ICD-10-CM | POA: Diagnosis not present

## 2014-10-31 DIAGNOSIS — I2584 Coronary atherosclerosis due to calcified coronary lesion: Secondary | ICD-10-CM | POA: Diagnosis present

## 2014-10-31 DIAGNOSIS — R0602 Shortness of breath: Secondary | ICD-10-CM

## 2014-10-31 DIAGNOSIS — Z7902 Long term (current) use of antithrombotics/antiplatelets: Secondary | ICD-10-CM

## 2014-10-31 DIAGNOSIS — Z955 Presence of coronary angioplasty implant and graft: Secondary | ICD-10-CM | POA: Diagnosis not present

## 2014-10-31 DIAGNOSIS — I208 Other forms of angina pectoris: Secondary | ICD-10-CM | POA: Diagnosis not present

## 2014-10-31 DIAGNOSIS — L270 Generalized skin eruption due to drugs and medicaments taken internally: Secondary | ICD-10-CM | POA: Diagnosis not present

## 2014-10-31 DIAGNOSIS — E785 Hyperlipidemia, unspecified: Secondary | ICD-10-CM | POA: Diagnosis present

## 2014-10-31 DIAGNOSIS — I25110 Atherosclerotic heart disease of native coronary artery with unstable angina pectoris: Secondary | ICD-10-CM | POA: Diagnosis present

## 2014-10-31 DIAGNOSIS — R52 Pain, unspecified: Secondary | ICD-10-CM

## 2014-10-31 DIAGNOSIS — Y92234 Operating room of hospital as the place of occurrence of the external cause: Secondary | ICD-10-CM | POA: Diagnosis not present

## 2014-10-31 DIAGNOSIS — I2510 Atherosclerotic heart disease of native coronary artery without angina pectoris: Secondary | ICD-10-CM

## 2014-10-31 DIAGNOSIS — K5900 Constipation, unspecified: Secondary | ICD-10-CM | POA: Diagnosis not present

## 2014-10-31 DIAGNOSIS — E119 Type 2 diabetes mellitus without complications: Secondary | ICD-10-CM | POA: Diagnosis present

## 2014-10-31 DIAGNOSIS — Z8249 Family history of ischemic heart disease and other diseases of the circulatory system: Secondary | ICD-10-CM

## 2014-10-31 DIAGNOSIS — D696 Thrombocytopenia, unspecified: Secondary | ICD-10-CM | POA: Diagnosis not present

## 2014-10-31 DIAGNOSIS — Z825 Family history of asthma and other chronic lower respiratory diseases: Secondary | ICD-10-CM | POA: Diagnosis not present

## 2014-10-31 DIAGNOSIS — T444X5A Adverse effect of predominantly alpha-adrenoreceptor agonists, initial encounter: Secondary | ICD-10-CM | POA: Diagnosis not present

## 2014-10-31 DIAGNOSIS — R112 Nausea with vomiting, unspecified: Secondary | ICD-10-CM | POA: Diagnosis not present

## 2014-10-31 DIAGNOSIS — I200 Unstable angina: Secondary | ICD-10-CM | POA: Diagnosis not present

## 2014-10-31 DIAGNOSIS — I10 Essential (primary) hypertension: Secondary | ICD-10-CM | POA: Diagnosis present

## 2014-10-31 DIAGNOSIS — Z79899 Other long term (current) drug therapy: Secondary | ICD-10-CM | POA: Diagnosis not present

## 2014-10-31 HISTORY — PX: LEFT HEART CATHETERIZATION WITH CORONARY ANGIOGRAM: SHX5451

## 2014-10-31 LAB — SPIROMETRY WITH GRAPH
FEF 25-75 Post: 1.47 L/sec
FEF 25-75 Pre: 1.56 L/sec
FEF2575-%Change-Post: -6 %
FEF2575-%Pred-Post: 81 %
FEF2575-%Pred-Pre: 86 %
FEV1-%Change-Post: -1 %
FEV1-%Pred-Post: 88 %
FEV1-%Pred-Pre: 89 %
FEV1-Post: 2.28 L
FEV1-Pre: 2.32 L
FEV1FVC-%Change-Post: -5 %
FEV1FVC-%Pred-Pre: 100 %
FEV6-%Change-Post: 0 %
FEV6-%Pred-Post: 94 %
FEV6-%Pred-Pre: 93 %
FEV6-Post: 3.17 L
FEV6-Pre: 3.15 L
FEV6FVC-%Change-Post: -1 %
FEV6FVC-%Pred-Post: 103 %
FEV6FVC-%Pred-Pre: 105 %
FVC-%Change-Post: 4 %
FVC-%Pred-Post: 92 %
FVC-%Pred-Pre: 88 %
FVC-Post: 3.36 L
FVC-Pre: 3.21 L
Post FEV1/FVC ratio: 68 %
Post FEV6/FVC ratio: 97 %
Pre FEV1/FVC ratio: 72 %
Pre FEV6/FVC Ratio: 98 %

## 2014-10-31 LAB — URINALYSIS, ROUTINE W REFLEX MICROSCOPIC
Bilirubin Urine: NEGATIVE
Glucose, UA: NEGATIVE mg/dL
Hgb urine dipstick: NEGATIVE
Ketones, ur: NEGATIVE mg/dL
Leukocytes, UA: NEGATIVE
Nitrite: NEGATIVE
Protein, ur: NEGATIVE mg/dL
Specific Gravity, Urine: 1.015 (ref 1.005–1.030)
Urobilinogen, UA: 0.2 mg/dL (ref 0.0–1.0)
pH: 7 (ref 5.0–8.0)

## 2014-10-31 LAB — SURGICAL PCR SCREEN
MRSA, PCR: NEGATIVE
Staphylococcus aureus: POSITIVE — AB

## 2014-10-31 SURGERY — LEFT HEART CATHETERIZATION WITH CORONARY ANGIOGRAM
Anesthesia: LOCAL

## 2014-10-31 MED ORDER — ASPIRIN 81 MG PO CHEW
CHEWABLE_TABLET | ORAL | Status: AC
  Filled 2014-10-31: qty 1

## 2014-10-31 MED ORDER — DIAZEPAM 5 MG PO TABS
5.0000 mg | ORAL_TABLET | ORAL | Status: AC
  Administered 2014-10-31: 5 mg via ORAL

## 2014-10-31 MED ORDER — CHLORTHALIDONE 25 MG PO TABS
25.0000 mg | ORAL_TABLET | Freq: Every day | ORAL | Status: DC
  Filled 2014-10-31: qty 1

## 2014-10-31 MED ORDER — SODIUM CHLORIDE 0.9 % IJ SOLN: 3.0000 mL | Freq: Two times a day (BID) | INTRAMUSCULAR | Status: DC

## 2014-10-31 MED ORDER — MIDAZOLAM HCL 2 MG/2ML IJ SOLN
INTRAMUSCULAR | Status: AC
  Filled 2014-10-31: qty 2

## 2014-10-31 MED ORDER — DIAZEPAM 5 MG PO TABS
ORAL_TABLET | ORAL | Status: AC
  Administered 2014-10-31: 5 mg via ORAL
  Filled 2014-10-31: qty 1

## 2014-10-31 MED ORDER — ONDANSETRON HCL 4 MG/2ML IJ SOLN: 4.0000 mg | Freq: Four times a day (QID) | INTRAMUSCULAR | Status: DC | PRN

## 2014-10-31 MED ORDER — LIDOCAINE HCL (PF) 1 % IJ SOLN
INTRAMUSCULAR | Status: AC
  Filled 2014-10-31: qty 30

## 2014-10-31 MED ORDER — SODIUM CHLORIDE 0.9 % IV SOLN
1.0000 mL/kg/h | INTRAVENOUS | Status: AC
  Administered 2014-10-31: 1 mL/kg/h via INTRAVENOUS

## 2014-10-31 MED ORDER — VERAPAMIL HCL 2.5 MG/ML IV SOLN
INTRAVENOUS | Status: AC
  Filled 2014-10-31: qty 2

## 2014-10-31 MED ORDER — ALBUTEROL SULFATE (2.5 MG/3ML) 0.083% IN NEBU
2.5000 mg | INHALATION_SOLUTION | Freq: Once | RESPIRATORY_TRACT | Status: AC
  Administered 2014-10-31: 2.5 mg via RESPIRATORY_TRACT

## 2014-10-31 MED ORDER — MUPIROCIN 2 % EX OINT
1.0000 "application " | TOPICAL_OINTMENT | Freq: Two times a day (BID) | CUTANEOUS | Status: AC
  Administered 2014-10-31 – 2014-11-05 (×9): 1 via NASAL
  Filled 2014-10-31: qty 22

## 2014-10-31 MED ORDER — ASPIRIN 81 MG PO CHEW
81.0000 mg | CHEWABLE_TABLET | ORAL | Status: AC
  Administered 2014-10-31: 81 mg via ORAL

## 2014-10-31 MED ORDER — ISOSORBIDE MONONITRATE ER 60 MG PO TB24
90.0000 mg | ORAL_TABLET | Freq: Every day | ORAL | Status: DC
  Filled 2014-10-31: qty 1

## 2014-10-31 MED ORDER — HEPARIN (PORCINE) IN NACL 100-0.45 UNIT/ML-% IJ SOLN
1000.0000 [IU]/h | INTRAMUSCULAR | Status: DC
Start: 2014-10-31 — End: 2014-11-01
  Administered 2014-10-31: 1000 [IU]/h via INTRAVENOUS
  Filled 2014-10-31: qty 250

## 2014-10-31 MED ORDER — SODIUM CHLORIDE 0.9 % IV SOLN: 250.0000 mL | INTRAVENOUS | Status: DC | PRN

## 2014-10-31 MED ORDER — HEPARIN (PORCINE) IN NACL 2-0.9 UNIT/ML-% IJ SOLN
INTRAMUSCULAR | Status: AC
  Filled 2014-10-31: qty 1000

## 2014-10-31 MED ORDER — OXYCODONE-ACETAMINOPHEN 5-325 MG PO TABS: 1.0000 | ORAL_TABLET | ORAL | Status: DC | PRN

## 2014-10-31 MED ORDER — ACETAMINOPHEN 325 MG PO TABS
650.0000 mg | ORAL_TABLET | ORAL | Status: DC | PRN
Start: 2014-10-31 — End: 2014-11-03

## 2014-10-31 MED ORDER — SODIUM CHLORIDE 0.9 % IJ SOLN: 3.0000 mL | INTRAMUSCULAR | Status: DC | PRN

## 2014-10-31 MED ORDER — HEPARIN (PORCINE) IN NACL 2-0.9 UNIT/ML-% IJ SOLN
INTRAMUSCULAR | Status: AC
  Filled 2014-10-31: qty 500

## 2014-10-31 MED ORDER — POTASSIUM CHLORIDE CRYS ER 20 MEQ PO TBCR
20.0000 meq | EXTENDED_RELEASE_TABLET | Freq: Two times a day (BID) | ORAL | Status: DC
  Administered 2014-10-31 – 2014-11-02 (×5): 20 meq via ORAL
  Filled 2014-10-31 (×9): qty 1

## 2014-10-31 MED ORDER — CHLORHEXIDINE GLUCONATE CLOTH 2 % EX PADS
6.0000 | MEDICATED_PAD | Freq: Every day | CUTANEOUS | Status: DC
  Administered 2014-10-31 – 2014-11-02 (×3): 6 via TOPICAL

## 2014-10-31 MED ORDER — FENTANYL CITRATE (PF) 100 MCG/2ML IJ SOLN
INTRAMUSCULAR | Status: AC
  Filled 2014-10-31: qty 2

## 2014-10-31 MED ORDER — ATENOLOL-CHLORTHALIDONE 50-25 MG PO TABS: 1.0000 | ORAL_TABLET | Freq: Every day | ORAL | Status: DC

## 2014-10-31 MED ORDER — ATENOLOL 50 MG PO TABS
50.0000 mg | ORAL_TABLET | Freq: Every day | ORAL | Status: DC
  Filled 2014-10-31: qty 1

## 2014-10-31 MED ORDER — ASPIRIN EC 81 MG PO TBEC
81.0000 mg | DELAYED_RELEASE_TABLET | Freq: Every day | ORAL | Status: DC
  Administered 2014-11-01 – 2014-11-02 (×2): 81 mg via ORAL
  Filled 2014-10-31 (×3): qty 1

## 2014-10-31 MED ORDER — SODIUM CHLORIDE 0.9 % IV SOLN
INTRAVENOUS | Status: DC
Start: 2014-11-01 — End: 2014-10-31

## 2014-10-31 MED ORDER — SODIUM CHLORIDE 0.9 % IJ SOLN
3.0000 mL | Freq: Two times a day (BID) | INTRAMUSCULAR | Status: DC
  Administered 2014-10-31 – 2014-11-02 (×4): 3 mL via INTRAVENOUS

## 2014-10-31 MED ORDER — HEPARIN SODIUM (PORCINE) 1000 UNIT/ML IJ SOLN
INTRAMUSCULAR | Status: AC
  Filled 2014-10-31: qty 1

## 2014-10-31 MED ORDER — TRAMADOL HCL 50 MG PO TABS
50.0000 mg | ORAL_TABLET | Freq: Three times a day (TID) | ORAL | Status: DC | PRN
  Administered 2014-11-03: 50 mg via ORAL
  Filled 2014-10-31: qty 1

## 2014-10-31 MED ORDER — PRAVASTATIN SODIUM 20 MG PO TABS
20.0000 mg | ORAL_TABLET | Freq: Every evening | ORAL | Status: DC
  Administered 2014-10-31 – 2014-11-08 (×8): 20 mg via ORAL
  Filled 2014-10-31 (×10): qty 1

## 2014-10-31 NOTE — Care Management Note (Signed)
    Page 1 of 1   10/31/2014     1:44:02 PM CARE MANAGEMENT NOTE 10/31/2014  Patient:  [name], [name]   Account Number:  [id number]  Date Initiated:  10/31/2014  Documentation initiated by:  Elissa Hefty  Subjective/Objective Assessment:   adm w pos card cath, for cvts eval     Action/Plan:   lives w wife, pcp dr Eliezer Lofts   Anticipated DC Date:     Anticipated DC Plan:  [place]         Choice offered to / List presented to:             Status of service:   Medicare Important Message given?   (If response is "NO", the following Medicare IM given date fields will be blank) Date Medicare IM given:   Medicare IM given by:   Date Additional Medicare IM given:   Additional Medicare IM given by:    Discharge Disposition:    Per UR Regulation:  Reviewed for med. necessity/level of care/duration of stay  If discussed at [place] of Stay Meetings, dates discussed:    Comments:

## 2014-10-31 NOTE — Consult Note (Signed)
[address]       [place] [address]             [phone]        Codi R [facility] [place] Medical Record #[id number] Date of Birth: July 25, 1934  Referring: No ref. provider found Primary Care: Eliezer Lofts, MD  Chief Complaint:   Chest pain consistent with unstable angina  History of Present Illness:     Patient examined, coronary arteriograms reviewed with the patient's cardiologist-Dr. Burt Knack. 80 -year-old Caucasian male with history of coronary disease status post drug-eluting stent in the LAD and RCA was admitted following cardiac catheterization today for increasing symptoms of chest pain with increased  frequency and intensity consistent with unstable angina. Cardiac catheterization demonstrates a 95% left main stenosis, 50% stenosis of a dominant RCA, 80% mid LAD stenosis and 80% proximal circumflex stenosis. LVEF is fairly well preserved. The patient was admitted and placed on IV heparin and multivessel CABG was recommended. The patient denies any pain at rest. He denies symptoms of CHF. No previous history of peripheral vascular disease. No history of smoking. He denies diabetes. Positive family history for CAD.   Current Activity/ Functional Status: Patient is retired lives with his wife remains very active   Zubrod Score: At the time of surgery this patient's most appropriate activity status/level should be described as: []     0    Normal activity, no symptoms [x]     1    Restricted in physical strenuous activity but ambulatory, able to do out light work []     2    Ambulatory and capable of self care, unable to do work activities, up and about                 more than 50%  Of the time                            []     3    Only limited self care, in bed greater than 50% of waking hours []     4    Completely disabled, no self care, confined to bed or chair []     5    Moribund  Past Medical History  Diagnosis Date  . Coronary atherosclerosis of  unspecified type of vessel, native or graft     s/p stent mid LAD 2005  . DJD (degenerative joint disease)   . GERD (gastroesophageal reflux disease)   . DM (diabetes mellitus)   . HTN (hypertension)   . Hyperlipidemia     Past Surgical History  Procedure Laterality Date  . Ptca      Hx of it.   . Knee surgery    . Kidney stone removal    . Sigmoidoscopy    . Total knee arthroplasty  02/2011    Right knee  . Craniotomy N/A 01/18/2014    Procedure: CRANIOTOMY HEMATOMA EVACUATION SUBDURAL;  Surgeon: Hosie Spangle, MD;  Location: [facility];  Service: Neurosurgery;  Laterality: N/A;  . Left heart catheterization with coronary angiogram N/A 07/27/2011    Procedure: LEFT HEART CATHETERIZATION WITH CORONARY ANGIOGRAM;  Surgeon: Burnell Blanks, MD;  Location: [facility] CATH LAB;  Service: Cardiovascular;  Laterality: N/A;  . Cardiac catheterization    . Brain surgery      History  Smoking status  . Former Smoker  Smokeless tobacco  . Never Used    Comment:  quit in 1985. smoked 1ppd for 35 years     History  Alcohol Use No    History   Social History  . Marital Status: Married    Spouse Name: N/A  . Number of Children: N/A  . Years of Education: N/A   Occupational History  . Not on file.   Social History Main Topics  . Smoking status: Former Research scientist (life sciences)  . Smokeless tobacco: Never Used     Comment: quit in 1985. smoked 1ppd for 35 years   . Alcohol Use: No  . Drug Use: No  . Sexual Activity: Yes   Other Topics Concern  . Not on file   Social History Narrative   Married with children. 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 10:20 am    Has living will,  HCPOA: jerrie Arel, full code (reviewed 2015)    Allergies  Allergen Reactions  . Statins Other (See Comments)    Caused soreness (Lipitor, Zocor)  . Imodium [Loperamide] Itching and Rash    Current Facility-Administered Medications  Medication Dose Route Frequency  Provider Last Rate Last Dose  . 0.9 %  sodium chloride infusion  1 mL/kg/hr Intravenous Continuous Sherren Mocha, MD 87.5 mL/hr at 10/31/14 1400 1 mL/kg/hr at 10/31/14 1400  . 0.9 %  sodium chloride infusion  250 mL Intravenous PRN Sherren Mocha, MD      . acetaminophen (TYLENOL) tablet 650 mg  650 mg Oral Q4H PRN Sherren Mocha, MD      . aspirin 81 MG chewable tablet           . [START ON 11/01/2014] aspirin EC tablet 81 mg  81 mg Oral Daily Sherren Mocha, MD      . Derrill Memo ON 11/01/2014] atenolol (TENORMIN) tablet 50 mg  50 mg Oral Daily Sherren Mocha, MD       And  . Derrill Memo ON 11/01/2014] chlorthalidone (HYGROTON) tablet 25 mg  25 mg Oral Daily Sherren Mocha, MD      . Chlorhexidine Gluconate Cloth 2 % PADS 6 each  6 each Topical Daily Ivin Poot, MD      . heparin ADULT infusion 100 units/mL (25000 units/250 mL)  1,000 Units/hr Intravenous Continuous Sherren Mocha, MD 10 mL/hr at 10/31/14 1800 1,000 Units/hr at 10/31/14 1800  . [START ON 11/01/2014] isosorbide mononitrate (IMDUR) 24 hr tablet 90 mg  90 mg Oral Daily Sherren Mocha, MD      . mupirocin ointment (BACTROBAN) 2 % 1 application  1 application Nasal BID Ivin Poot, MD      . ondansetron [facility]) injection 4 mg  4 mg Intravenous Q6H PRN Sherren Mocha, MD      . oxyCODONE-acetaminophen (PERCOCET/ROXICET) 5-325 MG per tablet 1-2 tablet  1-2 tablet Oral Q4H PRN Sherren Mocha, MD      . potassium chloride SA (K-DUR,KLOR-CON) CR tablet 20 mEq  20 mEq Oral BID Sherren Mocha, MD      . pravastatin (PRAVACHOL) tablet 20 mg  20 mg Oral QPM Sherren Mocha, MD   20 mg at 10/31/14 1816  . sodium chloride 0.9 % injection 3 mL  3 mL Intravenous Q12H Sherren Mocha, MD      . sodium chloride 0.9 % injection 3 mL  3 mL Intravenous PRN Sherren Mocha, MD      . traMADol Veat[facility] Bourbon) tablet 50 mg  50 mg Oral Q8H PRN Sherren Mocha, MD        Prescriptions prior to  admission  Medication Sig Dispense Refill Last Dose  . acetaminophen  (TYLENOL) 500 MG tablet Take 1,000 mg by mouth 2 (two) times daily.   10/30/2014 at Unknown time  . aspirin EC 81 MG tablet Take 81 mg by mouth daily.   10/30/2014 at Unknown time  . atenolol-chlorthalidone (TENORETIC) 50-25 MG per tablet TAKE 1 TABLET DAILY (Patient taking differently: Take 1 tablet by mouth daily. ) 90 tablet 1 10/31/2014 at 0830  . Flaxseed, Linseed, (FLAX SEED OIL) 1000 MG CAPS Take 1 capsule by mouth 2 (two) times daily.     10/30/2014 at Unknown time  . fluticasone (FLONASE) 50 MCG/ACT nasal spray Place 2 sprays into both nostrils daily as needed for allergies or rhinitis.   Past Week at Unknown time  . isosorbide mononitrate (IMDUR) 60 MG 24 hr tablet Take 1.5 tablets (90 mg total) by mouth every morning. 90 tablet 1 10/31/2014 at Unknown time  . nitroGLYCERIN (NITROSTAT) 0.4 MG SL tablet Place 1 tablet (0.4 mg total) under the tongue every 5 (five) minutes as needed. For chest pain 25 tablet 3 10/29/2014  . potassium chloride SA (KLOR-CON M20) 20 MEQ tablet TAKE 1 TABLET TWICE A DAY (Patient taking differently: Take 20 mEq by mouth 2 (two) times daily. ) 180 tablet 1 10/30/2014 at Unknown time  . pravastatin (PRAVACHOL) 20 MG tablet Take 1 tablet (20 mg total) by mouth every evening. 90 tablet 1 10/30/2014 at Unknown time  . traMADol (ULTRAM) 50 MG tablet Take 1 tablet (50 mg total) by mouth every 8 (eight) hours as needed. (Patient taking differently: Take 50 mg by mouth every 8 (eight) hours as needed for moderate pain. ) 30 tablet 0 More than a month at Unknown time    Family History  Problem Relation Age of Onset  . Heart disease Father   . Heart failure Mother   . COPD Mother   . Heart disease Sister      Review of Systems:  Patient had an emergency craniotomy last summer for subdural hematoma following a fall     Cardiac Review of Systems: Y or N  Chest Pain [  yes  ]  Resting SOB [ yes  ] Exertional SOB  [place]  ]  Orthopnea [no  ]   Pedal Edema [  no ]    Palpitations  [no  ] Syncope  [no ]   Presyncope [   no]  General Review of Systems: [Y] = yes [  ]=no Constitional: recent weight change [  ]; anorexia [  ]; fatigue [  ]; nausea [  ]; night sweats [  ]; fever [  ]; or chills [  ]                                                               Dental: poor dentition[  ]; Last Dentist visit: One year  Eye : blurred vision [  ]; diplopia [   ]; vision changes [  ];  Amaurosis fugax[  ]; Resp: cough [  ];  wheezing[  ];  hemoptysis[  ]; shortness of breath[  ]; paroxysmal nocturnal dyspnea[  ]; dyspnea on exertion[  ]; or orthopnea[  ];  GI:  gallstones[  ], vomiting[  ];  dysphagia[  ];  melena[  ];  hematochezia [  ]; heartburn[  ];   Hx of  Colonoscopy[  ]; GU: kidney stones [  ]; hematuria[  ];   dysuria [  ];  nocturia[  ];  history of     obstruction [  ]; urinary frequency [  ]             Skin: rash, swelling[  ];, hair loss[  ];  peripheral edema[  ];  or itching[  ]; Musculosketetal: myalgias[  ];  joint swelling[  ];  joint erythema[  ];  joint pain[yes-arthritis  ];  back pain[  ];  Heme/Lymph: bruising[  ];  bleeding[  ];  anemia[  ];  Neuro: TIA[  ];  headaches[  ];  stroke[  ];  vertigo[  ];  seizures[  ];   paresthesias[  ];  difficulty walking[  ];  Psych:depression[  ]; anxiety[  ];  Endocrine: diabetes[  ];  thyroid dysfunction[  ];  Immunizations: Flu [  ]; Pneumococcal[  ];  Other: Right hand dominant  Physical Exam: BP 136/71 mmHg  Pulse 71  Temp(Src) 97.8 F (36.6 C) (Oral)  Resp 12  Ht 5\' 7"  (1.702 m)  Wt 194 lb 0.1 oz (88 kg)  BMI 30.38 kg/m2  SpO2 94%   General: Pleasant elderly Caucasian male no acute distress with right radial wrist band HEENT: Normocephalic pupils equal , dentition adequate Neck: Supple without JVD, adenopathy, or bruit Chest: Clear to auscultation, symmetrical breath sounds, no rhonchi, no tenderness             or deformity Cardiovascular: Regular rate and rhythm, no murmur, no gallop, peripheral  pulses             palpable in all extremities Abdomen:  Soft, nontender, no palpable mass or organomegaly Extremities: Warm, well-perfused, no clubbing cyanosis edema or tenderness,              no venous stasis changes of the legs Rectal/GU: Deferred Neuro: Grossly non--focal and symmetrical throughout Skin: Clean and dry without rash or ulceration    Diagnostic Studies & Laboratory data:   Coronary tear grams reviewed with Dr. Sherren Mocha  Recent Radiology Findings:   No results found.  Chest x-ray reviewed-PA and lateral chest x-ray pending, echocardiogram pending I have independently reviewed the above radiologic studies.  Recent Lab Findings: Lab Results  Component Value Date   WBC 7.7 10/27/2014   HGB 13.5 10/27/2014   HCT 40.0 10/27/2014   PLT 249.0 10/27/2014   GLUCOSE 108* 10/27/2014   CHOL 169 02/28/2014   TRIG 184.0* 02/28/2014   HDL 36.80* 02/28/2014   LDLCALC 95 02/28/2014   ALT 15 01/19/2014   AST 15 01/19/2014   NA 139 10/27/2014   K 3.5 10/27/2014   CL 105 10/27/2014   CREATININE 0.87 10/27/2014   BUN 22 10/27/2014   CO2 27 10/27/2014   TSH 0.827 01/19/2014   INR 1.0 10/27/2014   HGBA1C 7.0* 02/28/2014      Assessment / Plan:     Unstable angina with left main stenosis, preserved LV systolic function Continue IV heparin and finish evaluation for CABG Multivessel CABG plan for Monday, April 25. Procedure indications risks and benefits reviewed with patient and family and they understand and agree.       @ME1 @ 10/31/2014 8:17 PM

## 2014-10-31 NOTE — Interval H&P Note (Signed)
History and Physical Interval Note:  10/31/2014 11:43 AM  [name]  has presented today for surgery, with the diagnosis of Unstable angina  The various methods of treatment have been discussed with the patient and family. After consideration of risks, benefits and other options for treatment, the patient has consented to  Procedure(s): LEFT HEART CATHETERIZATION WITH CORONARY ANGIOGRAM (N/A) as a surgical intervention .  The patient's history has been reviewed, patient examined, no change in status, stable for surgery.  I have reviewed the patient's chart and labs.  Questions were answered to the patient's satisfaction.    Cath Lab Visit (complete for each Cath Lab visit)  Clinical Evaluation Leading to the Procedure:   ACS: No.  Non-ACS:    Anginal Classification: CCS III  Anti-ischemic medical therapy: Maximal Therapy (2 or more classes of medications)  Non-Invasive Test Results: No non-invasive testing performed  Prior CABG: No previous CABG   History carefully reviewed. Pt had a traumatic subdural hematoma in July 2015 after he hit his head on a coffee table when he fell. Subsequent CT's of the brain showed resolution of the SDH, last scan in November 2011. He now has severe angina on maximal medical therapy and likely will require PCI. He should be fine for dual antiplatelet Rx at this point, now 9 months out from his event with resolution of the SDH 5 months ago by CT.    Sherren Mocha

## 2014-10-31 NOTE — H&P (View-Only) (Signed)
CARDIOLOGY OFFICE NOTE  Date:  10/27/2014    [name] Date of Birth: 10-16-1934 Medical Record #[id number]  PCP:  Eliezer Lofts, MD  Cardiologist:  [facility]    Chief Complaint  Patient presents with  . Chest Pain    Work in visit - seen for Dr. Angelena Form     History of Present Illness: [name] is a 80 y.o. male who presents today for a work in visit. Seen for Dr. Angelena Form. He has a history of CAD s/p placement of drug eluting stent in mid LAD in January of 2005 with residual disease in proximal LAD and RCA, HTN, HLD and GERD.  Cardiac cath 07/27/11 showed stable moderate CAD with patent LAD stent. He has not tolerated statins in the past secondary to myalgias.   He had a SDH requiring evacuation by Dr. Sherwood Gambler in July 2015 with resolution. He was taken off aspirin.   Last seen here in June of 2015. Chronic but stable angina with heavy exertion noted.   Phone call today -   Spoke with pt. He reports Dr. Angelena Form told him to call if chest pain worsens. Pt states he has been having chest pain with exertion for last couple of weeks. This is a change from last office visit. Occurs with walking and goes away with rest. Has used NTG once and it helped pain. No pain at present time but has not been exerting himself. Pt can come in today for office visit. Will review with provider in office.          Thus added to the FLEX today.   Comes in today. Here with his wife. He notes that for the past several weeks he has had progressive chest pain - coming more often with less activity. Resolved with rest. Notes that it is getting worse. Did take a NTG on Friday with prompt relief. Short of breath with the chest pain. No other associated symptoms. No spells at rest.  He had a subdural hematoma following a fall back in July of 2015 - required evacuation. Aspirin was stopped. The patient restarted this several months ago. Patient stopped his Keppra - not sure when. No seizures.     Past Medical History  Diagnosis Date  . Coronary atherosclerosis of unspecified type of vessel, native or graft     s/p stent mid LAD 2005  . DJD (degenerative joint disease)   . GERD (gastroesophageal reflux disease)   . DM (diabetes mellitus)   . HTN (hypertension)   . Hyperlipidemia     Past Surgical History  Procedure Laterality Date  . Ptca      Hx of it.   . Knee surgery    . Kidney stone removal    . Sigmoidoscopy    . Total knee arthroplasty  02/2011    Right knee  . Craniotomy N/A 01/18/2014    Procedure: CRANIOTOMY HEMATOMA EVACUATION SUBDURAL;  Surgeon: Hosie Spangle, MD;  Location: [facility];  Service: Neurosurgery;  Laterality: N/A;  . Left heart catheterization with coronary angiogram N/A 07/27/2011    Procedure: LEFT HEART CATHETERIZATION WITH CORONARY ANGIOGRAM;  Surgeon: Burnell Blanks, MD;  Location: [facility] CATH LAB;  Service: Cardiovascular;  Laterality: N/A;     Medications: Current Outpatient Prescriptions  Medication Sig Dispense Refill  . acetaminophen (TYLENOL) 500 MG tablet Take 1,000 mg by mouth 2 (two) times daily.    Marland Kitchen aspirin EC 81 MG tablet Take 81 mg by mouth  daily.    . atenolol-chlorthalidone (TENORETIC) 50-25 MG per tablet TAKE 1 TABLET DAILY (Patient taking differently: TAKE 1 TABLET BY MOUTH DAILY) 90 tablet 1  . Flaxseed, Linseed, (FLAX SEED OIL) 1000 MG CAPS Take 1 capsule by mouth 2 (two) times daily.      . fluticasone (FLONASE) 50 MCG/ACT nasal spray Place 2 sprays into both nostrils daily as needed for allergies or rhinitis.    Marland Kitchen isosorbide mononitrate (IMDUR) 60 MG 24 hr tablet Take 1.5 tablets (90 mg total) by mouth every morning. 90 tablet 1  . nitroGLYCERIN (NITROSTAT) 0.4 MG SL tablet Place 1 tablet (0.4 mg total) under the tongue every 5 (five) minutes as needed. For chest pain 25 tablet 3  . potassium chloride SA (KLOR-CON M20) 20 MEQ tablet TAKE 1 TABLET TWICE A DAY (Patient taking differently: TAKE 1 TABLET BY MOUTH TWICE A  DAY) 180 tablet 1  . pravastatin (PRAVACHOL) 20 MG tablet Take 1 tablet (20 mg total) by mouth every evening. 90 tablet 1  . traMADol (ULTRAM) 50 MG tablet Take 1 tablet (50 mg total) by mouth every 8 (eight) hours as needed. (Patient taking differently: Take 50 mg by mouth every 8 (eight) hours as needed (PAIN). ) 30 tablet 0   No current facility-administered medications for this visit.    Allergies: Allergies  Allergen Reactions  . Statins Other (See Comments)    Caused soreness (Lipitor, Zocor)    Social History: The patient  reports that he has quit smoking. He has never used smokeless tobacco. He reports that he does not drink alcohol or use illicit drugs.   Family History: The patient's family history includes COPD in his mother; Heart disease in his father and sister; Heart failure in his mother.   Review of Systems: Please see the history of present illness.   Otherwise, the review of systems is positive for chest pain.   All other systems are reviewed and negative.   Physical Exam: VS:  BP 130/72 mmHg  Pulse 60  Ht 5\' 7"  (1.702 m)  Wt 193 lb 9.6 oz (87.816 kg)  BMI 30.31 kg/m2 .  BMI Body mass index is 30.31 kg/(m^2).  Wt Readings from Last 3 Encounters:  10/27/14 193 lb 9.6 oz (87.816 kg)  03/07/14 187 lb 4 oz (84.936 kg)  01/07/14 188 lb (85.276 kg)    General: Pleasant. Well developed, well nourished and in no acute distress.  HEENT: Normal. Neck: Supple, no JVD, carotid bruits, or masses noted.  Cardiac: Regular rate and rhythm. No murmurs, rubs, or gallops. No edema.  Respiratory:  Lungs are clear to auscultation bilaterally with normal work of breathing.  GI: Soft and nontender.  MS: No deformity or atrophy. Gait and ROM intact. Skin: Warm and dry. Color is normal.  Neuro:  Strength and sensation are intact and no gross focal deficits noted.  Psych: Alert, appropriate and with normal affect.   LABORATORY DATA:  EKG:  EKG is ordered today. This  demonstrates NSR - unchanged  Lab Results  Component Value Date   WBC 14.1* 01/19/2014   HGB 12.2* 01/19/2014   HCT 36.8* 01/19/2014   PLT 212 01/19/2014   GLUCOSE 155* 01/19/2014   GLUCOSE 156* 01/19/2014   CHOL 169 02/28/2014   TRIG 184.0* 02/28/2014   HDL 36.80* 02/28/2014   LDLCALC 95 02/28/2014   ALT 15 01/19/2014   AST 15 01/19/2014   NA 142 01/19/2014   NA 142 01/19/2014   K 3.7 01/19/2014  K 3.7 01/19/2014   CL 103 01/19/2014   CL 103 01/19/2014   CREATININE 0.76 01/19/2014   CREATININE 0.78 01/19/2014   BUN 14 01/19/2014   BUN 15 01/19/2014   CO2 22 01/19/2014   CO2 21 01/19/2014   TSH 0.827 01/19/2014   PSA 0.99 11/28/2006   INR 0.96 01/18/2014   HGBA1C 7.0* 02/28/2014    BNP (last 3 results) No results for input(s): BNP in the last 8760 hours.  ProBNP (last 3 results) No results for input(s): PROBNP in the last 8760 hours.   Other Studies Reviewed Today:   Cardiac Catheterization Operative Report  PRESCOTT TRUEX [id number] 1/16/201310:47 AM Adella Hare, MD, MD  Procedure Performed:  1. Left Heart Catheterization 2. Selective Coronary Angiography 3. Left ventricular angiogram  Operator: Lauree Chandler, MD  Arterial access site: Right radial artery.   Indication: Known CAD, chest pain.   Procedure Details: The risks, benefits, complications, treatment options, and expected outcomes were discussed with the patient. The patient and/or family concurred with the proposed plan, giving informed consent. The patient was brought to the cath lab after IV hydration was begun and oral premedication was given. The patient was further sedated with Versed and Fentanyl. The right wrist was assessed with an Allens test which was positive. The right wrist was prepped and draped in a sterile fashion. 1% lidocaine was used for local anesthesia. Using the modified Seldinger access technique, a 5 French sheath was placed  in the right radial artery. 3 mg Verapamil was given through the sheath. 4000 units IV heparin was given. Standard diagnostic catheters were used to perform selective coronary angiography. A pigtail catheter was used to perform a left ventricular angiogram. The sheath was removed from the right radial artery and a Terumo hemostasis band was applied at the arteriotomy site on the right wrist.   There were no immediate complications. The patient was taken to the recovery area in stable condition.   Hemodynamic Findings: Central aortic pressure: 97/52 Left ventricular pressure: 102/3/16  Angiographic Findings:  Left main: Distal 10% stenosis.   Left Anterior Descending Artery: Proximal with diffuse 20% stenosis. 40% mid stenosis prior to mid stent. The stent has moderate, 40% restenosis that does not appear to be flow limiting. The distal vessel has several 60% stenoses which do not appear to be flow limiting. The first diagonal is small in caliber and has ostial 50% stenosis. The second diagonal is jailed by the stent and has an ostial   Circumflex Artery: Large caliber vessel. 40% mid stenosis.   Right Coronary Artery: 40% mid stenosis. 30% distal stenosis. 50% PDA stenosis.   Left Ventricular Angiogram: LVEF=55%.   Impression: 1. Moderate CAD 2. Patent stent mid LAD with moderate restenosis 3. Normal LV systolic function   Assessment/Plan: 1. Chest pain - known CAD - sounds like angina. Will increase his Imdur to 90 mg. Arrange for cardiac cath with Dr. Burt Knack (Dr. Angelena Form not available). The patient understands that risks include but are not limited to stroke (1 in 1000), death (1 in 65), kidney failure [usually temporary] (1 in 500), bleeding (1 in 200), allergic reaction [possibly serious] (1 in 200), and agrees to proceed.   He is to limit his activities in the interim.   2. HTN -  BP is ok on current regimen  3. HLD - statin intolerant but on Pravachol  4. Prior  subdural hematoma - patient has restarted his aspirin several months ago. SDH resolved on last CT.  Will discuss with Dr. Sherwood Gambler.   Current medicines are reviewed with the patient today.  The patient does not have concerns regarding medicines other than what has been noted above.  The following changes have been made:  See above.  Labs/ tests ordered today include:    Orders Placed This Encounter  Procedures  . Basic metabolic panel  . CBC  . Protime-INR  . APTT  . EKG 12-Lead     Disposition:   Further disposition to follow.   Patient is agreeable to this plan and will call if any problems develop in the interim.   Signed: Burtis Junes, RN, ANP-C 10/27/2014 2:18 PM  [place] [address] [place], [place]  [address] Phone: [phone] Fax: [phone]       Addendum: Called Dr. Sherwood Gambler to update - have left message for him to call back to discuss.

## 2014-10-31 NOTE — Progress Notes (Signed)
ANTICOAGULATION CONSULT NOTE - Initial Consult  Pharmacy Consult for heparin Indication: chest pain/ACS  Allergies  Allergen Reactions  . Statins Other (See Comments)    Caused soreness (Lipitor, Zocor)  . Imodium [Loperamide] Itching and Rash    Patient Measurements: Height: 5\' 7"  (170.2 cm) Weight: 193 lb (87.544 kg) IBW/kg (Calculated) : 66.1 Heparin Dosing Weight: 84kg  Vital Signs: Temp: 97.7 F (36.5 C) (04/22 1230) Temp Source: Oral (04/22 1230) BP: 129/70 mmHg (04/22 1230) Pulse Rate: 55 (04/22 1230)  Labs: No results for input(s): HGB, HCT, PLT, APTT, LABPROT, INR, HEPARINUNFRC, CREATININE, CKTOTAL, CKMB, TROPONINI in the last 72 hours.  Estimated Creatinine Clearance: 75.1 mL/min (by C-G formula based on Cr of 0.87).   Medical History: Past Medical History  Diagnosis Date  . Coronary atherosclerosis of unspecified type of vessel, native or graft     s/p stent mid LAD 2005  . DJD (degenerative joint disease)   . GERD (gastroesophageal reflux disease)   . DM (diabetes mellitus)   . HTN (hypertension)   . Hyperlipidemia     Medications:  Prescriptions prior to admission  Medication Sig Dispense Refill Last Dose  . acetaminophen (TYLENOL) 500 MG tablet Take 1,000 mg by mouth 2 (two) times daily.   10/30/2014 at Unknown time  . aspirin EC 81 MG tablet Take 81 mg by mouth daily.   10/30/2014 at Unknown time  . atenolol-chlorthalidone (TENORETIC) 50-25 MG per tablet TAKE 1 TABLET DAILY (Patient taking differently: Take 1 tablet by mouth daily. ) 90 tablet 1 10/31/2014 at 0830  . Flaxseed, Linseed, (FLAX SEED OIL) 1000 MG CAPS Take 1 capsule by mouth 2 (two) times daily.     10/30/2014 at Unknown time  . fluticasone (FLONASE) 50 MCG/ACT nasal spray Place 2 sprays into both nostrils daily as needed for allergies or rhinitis.   Past Week at Unknown time  . isosorbide mononitrate (IMDUR) 60 MG 24 hr tablet Take 1.5 tablets (90 mg total) by mouth every morning. 90 tablet  1 10/31/2014 at Unknown time  . nitroGLYCERIN (NITROSTAT) 0.4 MG SL tablet Place 1 tablet (0.4 mg total) under the tongue every 5 (five) minutes as needed. For chest pain 25 tablet 3 10/29/2014  . potassium chloride SA (KLOR-CON M20) 20 MEQ tablet TAKE 1 TABLET TWICE A DAY (Patient taking differently: Take 20 mEq by mouth 2 (two) times daily. ) 180 tablet 1 10/30/2014 at Unknown time  . pravastatin (PRAVACHOL) 20 MG tablet Take 1 tablet (20 mg total) by mouth every evening. 90 tablet 1 10/30/2014 at Unknown time  . traMADol (ULTRAM) 50 MG tablet Take 1 tablet (50 mg total) by mouth every 8 (eight) hours as needed. (Patient taking differently: Take 50 mg by mouth every 8 (eight) hours as needed for moderate pain. ) 30 tablet 0 More than a month at Unknown time    Assessment: 15 yom admitted for a cardiac cath. Found to have severe distal left main stenosis and heparin to start 2 hours after TR band is removed. Planning CABG on 4/25. Baseline CBC from office visit on 4/18 was WNL. He is not on any anticoagulation PTA.    Goal of Therapy:  Heparin level 0.3-0.7 units/ml Monitor platelets by anticoagulation protocol: Yes   Plan:  - Start heparin gtt 1000 units/hr 2 hours after TR band has been removed (RN to inform pharmacy of the time of TR band removal) - Check an 8 hr heparin level - Daily heparin level and CBC -  F/u surgery plans  Lebron Nauert, Rande Lawman 10/31/2014,3:04 PM

## 2014-10-31 NOTE — CV Procedure (Signed)
    Cardiac Catheterization Procedure Note  Name: [name] MRN: [id number] DOB: Nov 26, 1934  Procedure: Left Heart Cath, Selective Coronary Angiography, LV angiography  Indication:    Procedural Details: The right wrist was prepped, draped, and anesthetized with 1% lidocaine. Using the modified Seldinger technique, a 5/6 French Slender sheath was introduced into the right radial artery. 3 mg of verapamil was administered through the sheath, weight-based unfractionated heparin was administered intravenously. Standard Judkins catheters were used for selective coronary angiography and left ventriculography. Catheter exchanges were performed over an exchange length guidewire. There were no immediate procedural complications. A TR band was used for radial hemostasis at the completion of the procedure.  The patient was transferred to the post catheterization recovery area for further monitoring.  Procedural Findings: Hemodynamics: AO 113/56 LV 115/13  Coronary angiography: Coronary dominance: right  Left mainstem: The left main arises from the left cusp. There is mild calcification present. The distal left main has severe 95% stenosis at the bifurcation of the LAD and left circumflex.  Left anterior descending (LAD): The LAD is a severely diseased vessel. There is moderate calcification throughout the proximal LAD. The stented segment in the mid LAD has mild diffuse in-stent restenosis. The proximal LAD has mild 30-40% irregularity. The diagonal branch arising from the proximal LAD is small. The mid LAD beyond the stented segment has severe diffuse disease. There is diffuse 80% mid LAD stenosis extending down with diffuse irregularity and the apical portion of the LAD.  Left circumflex (LCx): The circumflex is large in caliber. The proximal circumflex has 20-30% stenosis. The mid circumflex has 50% stenosis. The obtuse marginal is a good target for grafting.  Right coronary artery (RCA): The  RCA is diffusely diseased. There is no high-grade obstruction present. The ostium of the RCA has 50% stenosis. There is no pressure dampening with the catheter in the ostium. The mid RCA has 50% stenosis unchanged from the previous studies. The PDA and PLA branches are patent. The midportion of the PDA has a 50-60% stenosis.  Left ventriculography: Left ventricular systolic function is normal, LVEF is estimated at 65%, there is no significant mitral regurgitation   Estimated Blood Loss: minimal  Final Conclusions:   1. Severe distal left main stenosis 2. Patent LAD stent with severe diffuse mid and distal LAD stenosis 3. Diffuse nonobstructive left circumflex and RCA stenoses 4. Normal LV systolic function  Recommendations: Hospital admission to a cardiac stepdown bed, IV heparin, cardiac surgery consultation. The patient has high risk coronary anatomy with severe distal left main stem stenosis. He has not had any resting anginal symptoms, that clearly his anatomy warrants at hospital admission, IV heparin for anticoagulation, and revascularization while he is in-house.  Sherren Mocha MD, [facility] 10/31/2014, 12:24 PM

## 2014-11-01 ENCOUNTER — Inpatient Hospital Stay (HOSPITAL_COMMUNITY): Payer: Medicare HMO

## 2014-11-01 DIAGNOSIS — I25110 Atherosclerotic heart disease of native coronary artery with unstable angina pectoris: Principal | ICD-10-CM

## 2014-11-01 DIAGNOSIS — I200 Unstable angina: Secondary | ICD-10-CM

## 2014-11-01 LAB — BASIC METABOLIC PANEL
ANION GAP: 12 (ref 5–15)
BUN: 12 mg/dL (ref 6–23)
CO2: 25 mmol/L (ref 19–32)
Calcium: 8.7 mg/dL (ref 8.4–10.5)
Chloride: 104 mmol/L (ref 96–112)
Creatinine, Ser: 0.87 mg/dL (ref 0.50–1.35)
GFR calc non Af Amer: 81 mL/min — ABNORMAL LOW (ref >90)
Glucose, Bld: 114 mg/dL — ABNORMAL HIGH (ref 70–99)
POTASSIUM: 3.1 mmol/L — AB (ref 3.5–5.1)
Sodium: 141 mmol/L (ref 135–145)

## 2014-11-01 LAB — CBC
HCT: 39.9 % (ref 39.0–52.0)
Hemoglobin: 13.4 g/dL (ref 13.0–17.0)
MCH: 28.9 pg (ref 26.0–34.0)
MCHC: 33.6 g/dL (ref 30.0–36.0)
MCV: 86 fL (ref 78.0–100.0)
Platelets: 172 10*3/uL (ref 150–400)
RBC: 4.64 MIL/uL (ref 4.22–5.81)
RDW: 13.7 % (ref 11.5–15.5)
WBC: 8.6 10*3/uL (ref 4.0–10.5)

## 2014-11-01 LAB — TROPONIN I
Troponin I: <0.03 ng/mL (ref <0.031)
Troponin I: <0.03 ng/mL (ref <0.031)

## 2014-11-01 LAB — HEPARIN LEVEL (UNFRACTIONATED)
HEPARIN UNFRACTIONATED: 0.28 [IU]/mL — AB (ref 0.30–0.70)
Heparin Unfractionated: 0.11 IU/mL — ABNORMAL LOW (ref 0.30–0.70)
Heparin Unfractionated: 0.47 IU/mL (ref 0.30–0.70)

## 2014-11-01 MED ORDER — POTASSIUM CHLORIDE CRYS ER 20 MEQ PO TBCR
40.0000 meq | EXTENDED_RELEASE_TABLET | Freq: Once | ORAL | Status: AC
  Administered 2014-11-01: 40 meq via ORAL

## 2014-11-01 MED ORDER — NITROGLYCERIN IN D5W 200-5 MCG/ML-% IV SOLN
0.0000 ug/min | INTRAVENOUS | Status: DC
  Administered 2014-11-01: 10 ug/min via INTRAVENOUS

## 2014-11-01 MED ORDER — METOPROLOL TARTRATE 1 MG/ML IV SOLN
INTRAVENOUS | Status: AC
  Filled 2014-11-01: qty 5

## 2014-11-01 MED ORDER — HEPARIN (PORCINE) IN NACL 100-0.45 UNIT/ML-% IJ SOLN
1400.0000 [IU]/h | INTRAMUSCULAR | Status: DC
  Administered 2014-11-01 – 2014-11-03 (×2): 1400 [IU]/h via INTRAVENOUS
  Filled 2014-11-01 (×5): qty 250

## 2014-11-01 MED ORDER — METOPROLOL TARTRATE 1 MG/ML IV SOLN
5.0000 mg | Freq: Once | INTRAVENOUS | Status: AC
  Administered 2014-11-01: 5 mg via INTRAVENOUS

## 2014-11-01 MED ORDER — NITROGLYCERIN IN D5W 200-5 MCG/ML-% IV SOLN
INTRAVENOUS | Status: AC
  Filled 2014-11-01: qty 250

## 2014-11-01 MED ORDER — METOPROLOL TARTRATE 50 MG PO TABS
50.0000 mg | ORAL_TABLET | Freq: Three times a day (TID) | ORAL | Status: DC
  Administered 2014-11-01 – 2014-11-03 (×6): 50 mg via ORAL
  Filled 2014-11-01 (×10): qty 1

## 2014-11-01 NOTE — Progress Notes (Addendum)
ANTICOAGULATION CONSULT NOTE - Follow up  Pharmacy Consult for heparin Indication: chest pain/ACS  Allergies  Allergen Reactions  . Statins Other (See Comments)    Caused soreness (Lipitor, Zocor)  . Imodium [Loperamide] Itching and Rash    Patient Measurements: Height: 5\' 7"  (170.2 cm) Weight: 194 lb 0.1 oz (88 kg) IBW/kg (Calculated) : 66.1 Heparin Dosing Weight: 84kg  Vital Signs: Temp: 98.8 F (37.1 C) (04/22 2000) Temp Source: Oral (04/22 2000) BP: 136/71 mmHg (04/22 2000) Pulse Rate: 71 (04/22 2000)  Labs:  Recent Labs  11/01/14 0417  HGB 13.4  HCT 39.9  PLT 172  HEPARINUNFRC 0.11*    Estimated Creatinine Clearance: 75.3 mL/min (by C-G formula based on Cr of 0.87).   Medical History: Past Medical History  Diagnosis Date  . Coronary atherosclerosis of unspecified type of vessel, native or graft     s/p stent mid LAD 2005  . DJD (degenerative joint disease)   . GERD (gastroesophageal reflux disease)   . DM (diabetes mellitus)   . HTN (hypertension)   . Hyperlipidemia     Medications:  Prescriptions prior to admission  Medication Sig Dispense Refill Last Dose  . acetaminophen (TYLENOL) 500 MG tablet Take 1,000 mg by mouth 2 (two) times daily.   10/30/2014 at Unknown time  . aspirin EC 81 MG tablet Take 81 mg by mouth daily.   10/30/2014 at Unknown time  . atenolol-chlorthalidone (TENORETIC) 50-25 MG per tablet TAKE 1 TABLET DAILY (Patient taking differently: Take 1 tablet by mouth daily. ) 90 tablet 1 10/31/2014 at 0830  . Flaxseed, Linseed, (FLAX SEED OIL) 1000 MG CAPS Take 1 capsule by mouth 2 (two) times daily.     10/30/2014 at Unknown time  . fluticasone (FLONASE) 50 MCG/ACT nasal spray Place 2 sprays into both nostrils daily as needed for allergies or rhinitis.   Past Week at Unknown time  . isosorbide mononitrate (IMDUR) 60 MG 24 hr tablet Take 1.5 tablets (90 mg total) by mouth every morning. 90 tablet 1 10/31/2014 at Unknown time  . nitroGLYCERIN  (NITROSTAT) 0.4 MG SL tablet Place 1 tablet (0.4 mg total) under the tongue every 5 (five) minutes as needed. For chest pain 25 tablet 3 10/29/2014  . potassium chloride SA (KLOR-CON M20) 20 MEQ tablet TAKE 1 TABLET TWICE A DAY (Patient taking differently: Take 20 mEq by mouth 2 (two) times daily. ) 180 tablet 1 10/30/2014 at Unknown time  . pravastatin (PRAVACHOL) 20 MG tablet Take 1 tablet (20 mg total) by mouth every evening. 90 tablet 1 10/30/2014 at Unknown time  . traMADol (ULTRAM) 50 MG tablet Take 1 tablet (50 mg total) by mouth every 8 (eight) hours as needed. (Patient taking differently: Take 50 mg by mouth every 8 (eight) hours as needed for moderate pain. ) 30 tablet 0 More than a month at Unknown time    Assessment: 37 yom admitted for a cardiac cath. Found to have severe distal left main stenosis and heparin started 2 hours after TR band removed. Initial 8 hour heparin level is 0.11 on 1000 units/hr.  CBC within normal. No bleeding noted.   Plan for CABG on 4/25.   Baseline CBC from office visit on 4/18 was WNL. He was not on any anticoagulation PTA.    Goal of Therapy:  Heparin level 0.3-0.7 units/ml Monitor platelets by anticoagulation protocol: Yes   Plan:  Increase heparin drip rate to 1250 units/hr  Check heparin level in ~6 hoursl Daily heparin level  and CBC F/u surgery plans- plan for CABG on 11/03/14  Nicole Cella, RPh Clinical Pharmacist Pager: [phone] 11/01/2014,5:57 AM  ___________________________________________________ ADDENDUM  6hr HL returns subtherapeutic at 0.28. Hgb 13.4, plts 172. No bleeding noted, no issues with line per RN.   Plan: Increase heparin to 1400 units/hr 8hr HL @ 2230 Daily HL/CBC Monitor s/sx of bleeding  Thank you for allowing pharmacy to be part of this patient's care team  Jen[place], Pharm.D Clinical Pharmacy Resident Pager: [phone] 11/01/2014 .2:19 PM

## 2014-11-01 NOTE — Progress Notes (Signed)
Pt complains of midsternal chest pressure 7/10 with no nausea or SHOB; 12 lead EKG completed; O2 [name] applied; MD Bensimhon at bedside; verbal orders received for Lopressor/NTG IV and potassium po; pt states pain decreasing to 1-2/10 after oxygen and medicine administered; VS per flowsheet; will continue to closely monitor

## 2014-11-01 NOTE — Progress Notes (Addendum)
Subjective:    Cath yesterday reviewed. Has severe CAD with 95% LM stenosis. Seen by Dr, Prescott Gum. Pending CABG Monday.   Was resting comfortably but now with 4/10 CP. No other symptoms    Intake/Output Summary (Last 24 hours) at 11/01/14 0903 Last data filed at 11/01/14 0700  Gross per 24 hour  Intake 1741.5 ml  Output   1700 ml  Net   41.5 ml    Current meds: . aspirin EC  81 mg Oral Daily  . atenolol  50 mg Oral Daily   And  . chlorthalidone  25 mg Oral Daily  . Chlorhexidine Gluconate Cloth  6 each Topical Daily  . isosorbide mononitrate  90 mg Oral Daily  . mupirocin ointment  1 application Nasal BID  . potassium chloride SA  20 mEq Oral BID  . pravastatin  20 mg Oral QPM  . sodium chloride  3 mL Intravenous Q12H   Infusions: . heparin 1,250 Units/hr (11/01/14 0659)     Objective:  Blood pressure 130/70, pulse 60, temperature 98.8 F (37.1 C), temperature source Oral, resp. rate 12, height 5\' 7"  (1.702 m), weight 88 kg (194 lb 0.1 oz), SpO2 97 %. Weight change:   Physical Exam: General:  Elderly. Lying in bed No resp difficulty HEENT: normal Neck: supple. JVP 6 . Carotids 2+ bilat; no bruits. No lymphadenopathy or thryomegaly appreciated. Cor: PMI nondisplaced. Regular rate & rhythm. No rubs, gallops or murmurs. Lungs: clear Abdomen: soft, nontender, nondistended. No hepatosplenomegaly. No bruits or masses. Good bowel sounds. Extremities: no cyanosis, clubbing, rash, edema Neuro: alert & orientedx3, cranial nerves grossly intact. moves all 4 extremities w/o difficulty. Affect pleasant  Telemetry: SR 80s  Lab Results: Basic Metabolic Panel:  Recent Labs Lab 10/27/14 1434 11/01/14 0417  NA 139 141  K 3.5 3.1*  CL 105 104  CO2 27 25  GLUCOSE 108* 114*  BUN 22 12  CREATININE 0.87 0.87  CALCIUM 9.4 8.7   Liver Function Tests: No results for input(s): AST, ALT, ALKPHOS, BILITOT, PROT, ALBUMIN in the last 168 hours. No results for input(s):  LIPASE, AMYLASE in the last 168 hours. No results for input(s): AMMONIA in the last 168 hours. CBC:  Recent Labs Lab 10/27/14 1434 11/01/14 0417  WBC 7.7 8.6  HGB 13.5 13.4  HCT 40.0 39.9  MCV 86.3 86.0  PLT 249.0 172   Cardiac Enzymes: No results for input(s): CKTOTAL, CKMB, CKMBINDEX, TROPONINI in the last 168 hours. BNP: Invalid input(s): POCBNP CBG: No results for input(s): GLUCAP in the last 168 hours. Microbiology: No results found for: CULT No results for input(s): CULT, SDES in the last 168 hours.  Imaging: Dg Chest 2 View  11/01/2014   CLINICAL DATA:  80 year old male with a history of shortness of breath  EXAM: CHEST - 2 VIEW  COMPARISON:  Contemporaneously chest CT 10/31/2014, chest x-ray 01/19/2014, 01/18/2014  FINDINGS: Cardiomediastinal silhouette projects within normal limits in size and contour. No confluent airspace disease, pneumothorax, or pleural effusion.  Stigmata of emphysema, with increased retrosternal airspace, flattened hemidiaphragms, increased AP diameter, and hyperinflation on the AP view.  Hiatal hernia.  Atherosclerotic calcifications of the aortic arch.  No displaced fracture.  Posttraumatic changes of the left clavicle.  Unremarkable appearance of the upper abdomen.  IMPRESSION: No radiographic evidence of acute cardiopulmonary disease.  Changes of emphysema.  Atherosclerosis.  Hiatal hernia.  Signed,  Dulcy Fanny. Earleen Newport, DO  [facility]  [facility]  Electronically Signed   By: Corrie Mckusick D.O.   On: 11/01/2014 07:47   Ct Chest Wo Contrast  11/01/2014   CLINICAL DATA:  Patient examined, coronary arteriograms reviewed with the patient's cardiologist-Dr. Burt Knack.62 -year-old Caucasian male with history of coronary disease status post drug-eluting stent in the LAD and RCA was admitted following cardiac catheterization today for increasing symptoms of chest pain with increased frequency and intensity consistent  with unstable angina. Cardiac catheterization demonstrates a 95% left main stenosis, 50% stenosis of a dominant RCA, 80% mid LAD stenosis and 80% proximal circumflex stenosis. LVEF is fairly well preserved. The patient was admitted and placed on IV heparin and multivessel CABG was recommended. The patient denies any pain at rest. He denies symptoms of CHF. No previous history of peripheral vascular disease. No history of smoking. He denies diabetes. Positive family history for CAD.  EXAM: CT CHEST WITHOUT CONTRAST  TECHNIQUE: Multidetector CT imaging of the chest was performed following the standard protocol without IV contrast.  COMPARISON:  Chest radiograph, 01/19/2014.  FINDINGS: Thoracic inlet:  No masses or adenopathy.  Mediastinum and hila: Heart is normal in size and configuration. There are dense coronary artery calcifications. Moderate hiatal hernia. There is some residual dense material consistent with contrast within the hiatal hernia and mid and lower esophagus. No mediastinal or hilar masses or pathologically enlarged lymph nodes. Great vessels are normal in caliber. Atherosclerotic calcifications noted along the thoracic aorta and its branch vessels.  Lungs and pleura: No lung consolidation or edema. No mass or suspicious nodule. Minor dependent reticular opacities are noted mostly in the lower lobes consistent subsegmental atelectasis. No pleural effusion or pneumothorax.  Limited upper abdomen: Fatty infiltration of the liver. No liver mass. No acute findings in the visualized upper abdomen.  Musculoskeletal: Mild disc degenerative changes throughout the visualized spine. No osteoblastic or osteolytic lesions.  IMPRESSION: 1. No acute findings. 2. Densely calcified coronary arteries consistent with the provided history. 3. Great vessels normal in caliber. Atherosclerotic calcifications along the thoracic aorta and its branch vessels. 4. Lungs essentially clear. 5. Hepatic steatosis.   Electronically  Signed   By: Lajean Manes M.D.   On: 11/01/2014 07:49     ASSESSMENT:  1. [place] 2. Severe CAD with 95% LM stenosis 3. DM 4. HTN 5. Hypokalemia  PLAN/DISCUSSION:  He has recurrent CP this am. Will change NTG to IV. Give IV lopressor. Check ECG and cardiac markers. Continue heparin, ASA and b-blocker.  If pain persists low threshold for IABP and possible emergent CABG but hopefully we can avoid.  Supp K+. Will stop thiazide diuretic.   LOS: 1 day    Glori Bickers, MD 11/01/2014, 9:03 AM  Addendum: ECG with mild ST depression which is new. Will treat angina aggressively. If not able to control pain will proceed as above.   Ailee Pates,MD 9:20 AM

## 2014-11-01 NOTE — Progress Notes (Signed)
  Echocardiogram 2D Echocardiogram has been performed.  [name] 11/01/2014, 12:10 PM

## 2014-11-01 NOTE — Progress Notes (Signed)
[id number] Cardiac Rehab Completed pre-op education with pt. I gave him surgery education booklet and pt care guide. We discussed sternal precautions, use of IS and walking post-op. He voices understanding. Instructed pt in use of IS and gave him one. Placed going for heart surgery video on for pt to watch. Pt states that his wife will be able to provide care for him 24/7 at discharge. Deon Pilling, RN 11/01/2014 2:39 PM

## 2014-11-01 NOTE — Progress Notes (Signed)
ANTICOAGULATION CONSULT NOTE - Follow Up Consult  Pharmacy Consult for heparin Indication: CAD awaiting CABG   Labs:  Recent Labs  11/01/14 0417 11/01/14 1040 11/01/14 1258 11/01/14 1502 11/01/14 2056 11/01/14 2208  HGB 13.4  --   --   --   --   --   HCT 39.9  --   --   --   --   --   PLT 172  --   --   --   --   --   HEPARINUNFRC 0.11*  --  0.28*  --   --  0.47  CREATININE 0.87  --   --   --   --   --   TROPONINI  --  <0.03  --  <0.03 <0.03  --      Assessment/Plan:  80yo male therapeutic on heparin after rate increase. Will continue gtt at current rate and confirm stable with am labs.   Wynona Neat, PharmD, BCPS  11/01/2014,11:01 PM

## 2014-11-02 DIAGNOSIS — I2510 Atherosclerotic heart disease of native coronary artery without angina pectoris: Secondary | ICD-10-CM

## 2014-11-02 DIAGNOSIS — I208 Other forms of angina pectoris: Secondary | ICD-10-CM

## 2014-11-02 LAB — BASIC METABOLIC PANEL
ANION GAP: 10 (ref 5–15)
Anion gap: 10 (ref 5–15)
BUN: 13 mg/dL (ref 6–23)
BUN: 14 mg/dL (ref 6–23)
CHLORIDE: 106 mmol/L (ref 96–112)
CHLORIDE: 104 mmol/L (ref 96–112)
CO2: 23 mmol/L (ref 19–32)
CO2: 24 mmol/L (ref 19–32)
Calcium: 8.6 mg/dL (ref 8.4–10.5)
Calcium: 8.8 mg/dL (ref 8.4–10.5)
Creatinine, Ser: 0.91 mg/dL (ref 0.50–1.35)
Creatinine, Ser: 0.93 mg/dL (ref 0.50–1.35)
GFR calc Af Amer: >90 mL/min (ref >90)
GFR calc non Af Amer: 80 mL/min — ABNORMAL LOW (ref >90)
GFR, EST NON AFRICAN AMERICAN: 79 mL/min — AB (ref >90)
GLUCOSE: 129 mg/dL — AB (ref 70–99)
Glucose, Bld: 180 mg/dL — ABNORMAL HIGH (ref 70–99)
Potassium: 3.2 mmol/L — ABNORMAL LOW (ref 3.5–5.1)
Potassium: 3.3 mmol/L — ABNORMAL LOW (ref 3.5–5.1)
Sodium: 139 mmol/L (ref 135–145)
Sodium: 138 mmol/L (ref 135–145)

## 2014-11-02 LAB — CBC
HCT: 41.3 % (ref 39.0–52.0)
HEMOGLOBIN: 13.9 g/dL (ref 13.0–17.0)
MCH: 29.3 pg (ref 26.0–34.0)
MCHC: 33.7 g/dL (ref 30.0–36.0)
MCV: 86.9 fL (ref 78.0–100.0)
Platelets: 196 10*3/uL (ref 150–400)
RBC: 4.75 MIL/uL (ref 4.22–5.81)
RDW: 13.6 % (ref 11.5–15.5)
WBC: 9.7 10*3/uL (ref 4.0–10.5)

## 2014-11-02 LAB — HEPARIN LEVEL (UNFRACTIONATED): Heparin Unfractionated: 0.41 IU/mL (ref 0.30–0.70)

## 2014-11-02 LAB — PREPARE RBC (CROSSMATCH)

## 2014-11-02 LAB — PLATELET INHIBITION P2Y12: Platelet Function  P2Y12: 240 [PRU] (ref 194–418)

## 2014-11-02 MED ORDER — NITROGLYCERIN IN D5W 200-5 MCG/ML-% IV SOLN
2.0000 ug/min | INTRAVENOUS | Status: DC
  Administered 2014-11-03: 15 ug/min via INTRAVENOUS
  Filled 2014-11-02: qty 250

## 2014-11-02 MED ORDER — CHLORHEXIDINE GLUCONATE 4 % EX LIQD
60.0000 mL | Freq: Once | CUTANEOUS | Status: AC
  Administered 2014-11-03: 4 via TOPICAL
  Filled 2014-11-02: qty 60

## 2014-11-02 MED ORDER — POTASSIUM CHLORIDE CRYS ER 20 MEQ PO TBCR
40.0000 meq | EXTENDED_RELEASE_TABLET | Freq: Once | ORAL | Status: AC
  Administered 2014-11-02: 40 meq via ORAL
  Filled 2014-11-02: qty 2

## 2014-11-02 MED ORDER — DEXMEDETOMIDINE HCL IN NACL 400 MCG/100ML IV SOLN
0.1000 ug/kg/h | INTRAVENOUS | Status: DC
  Administered 2014-11-03: .3 ug/kg/h via INTRAVENOUS
  Administered 2014-11-03: 15:00:00 via INTRAVENOUS
  Filled 2014-11-02: qty 100

## 2014-11-02 MED ORDER — MAGNESIUM SULFATE 50 % IJ SOLN
40.0000 meq | INTRAMUSCULAR | Status: DC
  Filled 2014-11-02 (×2): qty 10

## 2014-11-02 MED ORDER — CHLORHEXIDINE GLUCONATE 4 % EX LIQD
60.0000 mL | Freq: Once | CUTANEOUS | Status: AC
  Administered 2014-11-02: 4 via TOPICAL
  Filled 2014-11-02: qty 60

## 2014-11-02 MED ORDER — ALPRAZOLAM 0.25 MG PO TABS: 0.2500 mg | ORAL_TABLET | ORAL | Status: DC | PRN

## 2014-11-02 MED ORDER — TEMAZEPAM 15 MG PO CAPS
15.0000 mg | ORAL_CAPSULE | Freq: Once | ORAL | Status: AC | PRN
Start: 2014-11-02 — End: 2014-11-02
  Administered 2014-11-02: 15 mg via ORAL
  Filled 2014-11-02: qty 1

## 2014-11-02 MED ORDER — POTASSIUM CHLORIDE 2 MEQ/ML IV SOLN
80.0000 meq | INTRAVENOUS | Status: DC
  Filled 2014-11-02: qty 40

## 2014-11-02 MED ORDER — VANCOMYCIN HCL 10 G IV SOLR
1250.0000 mg | INTRAVENOUS | Status: DC
  Administered 2014-11-03: 1250 mg via INTRAVENOUS
  Filled 2014-11-02: qty 1250

## 2014-11-02 MED ORDER — BISACODYL 5 MG PO TBEC: 5.0000 mg | DELAYED_RELEASE_TABLET | Freq: Once | ORAL | Status: DC

## 2014-11-02 MED ORDER — METOPROLOL TARTRATE 12.5 MG HALF TABLET
12.5000 mg | ORAL_TABLET | Freq: Once | ORAL | Status: DC
  Filled 2014-11-02: qty 1

## 2014-11-02 MED ORDER — EPINEPHRINE HCL 1 MG/ML IJ SOLN
0.0000 ug/min | INTRAVENOUS | Status: DC
  Filled 2014-11-02: qty 4

## 2014-11-02 MED ORDER — PAPAVERINE HCL 30 MG/ML IJ SOLN
INTRAMUSCULAR | Status: DC
  Filled 2014-11-02: qty 2.5

## 2014-11-02 MED ORDER — SODIUM CHLORIDE 0.9 % IV SOLN
INTRAVENOUS | Status: DC
  Administered 2014-11-03: 69.8 mL/h via INTRAVENOUS
  Administered 2014-11-03: 14:00:00 via INTRAVENOUS
  Filled 2014-11-02: qty 40

## 2014-11-02 MED ORDER — DIAZEPAM 2 MG PO TABS
2.0000 mg | ORAL_TABLET | Freq: Once | ORAL | Status: AC
  Administered 2014-11-03: 2 mg via ORAL
  Filled 2014-11-02: qty 1

## 2014-11-02 MED ORDER — PHENYLEPHRINE HCL 10 MG/ML IJ SOLN
30.0000 ug/min | INTRAVENOUS | Status: DC
  Filled 2014-11-02: qty 2

## 2014-11-02 MED ORDER — CEFUROXIME SODIUM 750 MG IJ SOLR
750.0000 mg | INTRAMUSCULAR | Status: DC
  Filled 2014-11-02: qty 750

## 2014-11-02 MED ORDER — DOPAMINE-DEXTROSE 3.2-5 MG/ML-% IV SOLN
0.0000 ug/kg/min | INTRAVENOUS | Status: DC
  Filled 2014-11-02: qty 250

## 2014-11-02 MED ORDER — DEXTROSE 5 % IV SOLN
1.5000 g | INTRAVENOUS | Status: DC
  Administered 2014-11-03: 750 mg via INTRAVENOUS
  Administered 2014-11-03: 1500 mg via INTRAVENOUS
  Filled 2014-11-02: qty 1.5

## 2014-11-02 MED ORDER — SODIUM CHLORIDE 0.9 % IV SOLN
INTRAVENOUS | Status: DC
  Filled 2014-11-02: qty 30

## 2014-11-02 MED ORDER — SODIUM CHLORIDE 0.9 % IV SOLN
INTRAVENOUS | Status: DC
  Administered 2014-11-03: 1.6 [IU]/h via INTRAVENOUS
  Filled 2014-11-02: qty 2.5

## 2014-11-02 NOTE — Progress Notes (Addendum)
Pre-op Cardiac Surgery  Carotid Findings:  1-39% ICA stenosis.  Vertebral artery flow is antegrade.   Upper Extremity Right Left  Brachial Pressures 121T 130T  Radial Waveforms T T  Ulnar Waveforms T T  Palmar Arch (Allen's Test) WNL WNL   Findings:  WNL    Lower  Extremity Right Left  Dorsalis Pedis    Anterior Tibial 136T 146T  Posterior Tibial 146T 141T  Ankle/Brachial Indices >1 >1    Findings:  WNL

## 2014-11-02 NOTE — Progress Notes (Signed)
2 Days Post-Op Procedure(s) (LRB): LEFT HEART CATHETERIZATION WITH CORONARY ANGIOGRAM (N/A) Subjective: No chest pain this am preop  Echo, PFTs and CT reviewed Objective: Vital signs in last 24 hours: Temp:  [97.8 F (36.6 C)-98.8 F (37.1 C)] 98.3 F (36.8 C) (04/24 0752) Pulse Rate:  [62-81] 62 (04/24 0800) Cardiac Rhythm:  [-] Normal sinus rhythm (04/24 0800) Resp:  [16-20] 18 (04/24 0752) BP: (83-179)/(36-105) 114/66 mmHg (04/24 0800) SpO2:  [95 %-98 %] 97 % (04/24 0800)  Hemodynamic parameters for last 24 hours:  nsr  Intake/Output from previous day: 04/23 0701 - 04/24 0700 In: 1676.5 [P.O.:1270; I.V.:406.5] Out: 1725 [Urine:1725] Intake/Output this shift: Total I/O In: 37 [I.V.:37] Out: 350 [Urine:350]  No bleeding from cath site  Lab Results:  Recent Labs  11/01/14 0417 11/02/14 0225  WBC 8.6 9.7  HGB 13.4 13.9  HCT 39.9 41.3  PLT 172 196   BMET:  Recent Labs  11/01/14 0417 11/02/14 0225  NA 141 139  K 3.1* 3.2*  CL 104 106  CO2 25 23  GLUCOSE 114* 129*  BUN 12 13  CREATININE 0.87 0.91  CALCIUM 8.7 8.6    PT/INR: No results for input(s): LABPROT, INR in the last 72 hours. ABG No results found for: PHART, HCO3, TCO2, ACIDBASEDEF, O2SAT CBG (last 3)  No results for input(s): GLUCAP in the last 72 hours.  Assessment/Plan: S/P Procedure(s) (LRB): LEFT HEART CATHETERIZATION WITH CORONARY ANGIOGRAM (N/A) CABG in AM - procedure discussed with patient   LOS: 2 days    Tharon Aquas Trigt III 11/02/2014

## 2014-11-02 NOTE — Progress Notes (Signed)
Subjective:   Admitted with [place].  Has severe CAD with 95% LM stenosis. Seen by Dr, Prescott Gum. Pending CABG Monday.   Yesterday had CP. Started on IV NTG. No further CP. Troponin remains normal.    Intake/Output Summary (Last 24 hours) at 11/02/14 1158 Last data filed at 11/02/14 0900  Gross per 24 hour  Intake 1188.55 ml  Output   1775 ml  Net -586.45 ml    Current meds: . [START ON 11/03/2014] aminocaproic acid (AMICAR) for OHS   Intravenous To OR  . aspirin EC  81 mg Oral Daily  . bisacodyl  5 mg Oral Once  . [START ON 11/03/2014] cefUROXime (ZINACEF)  IV  750 mg Intravenous To OR  . chlorhexidine  60 mL Topical Once   And  . [START ON 11/03/2014] chlorhexidine  60 mL Topical Once  . Chlorhexidine Gluconate Cloth  6 each Topical Daily  . [START ON 11/03/2014] dexmedetomidine  0.1-0.7 mcg/kg/hr Intravenous To OR  . [START ON 11/03/2014] diazepam  2 mg Oral Once  . [START ON 11/03/2014] DOPamine  0-10 mcg/kg/min Intravenous To OR  . [START ON 11/03/2014] epinephrine  0-10 mcg/min Intravenous To OR  . [START ON 11/03/2014] heparin-papaverine-plasmalyte irrigation   Irrigation To OR  . [START ON 11/03/2014] heparin 30,000 units/NS 1000 mL solution for CELLSAVER   Other To OR  . [START ON 11/03/2014] insulin (NOVOLIN-R) infusion   Intravenous To OR  . [START ON 11/03/2014] magnesium sulfate  40 mEq Other To OR  . metoprolol tartrate  50 mg Oral 3 times per day  . [START ON 11/03/2014] metoprolol tartrate  12.5 mg Oral Once  . mupirocin ointment  1 application Nasal BID  . [START ON 11/03/2014] nitroGLYCERIN  2-200 mcg/min Intravenous To OR  . [START ON 11/03/2014] phenylephrine (NEO-SYNEPHRINE) Adult infusion  30-200 mcg/min Intravenous To OR  . [START ON 11/03/2014] potassium chloride  80 mEq Other To OR  . potassium chloride SA  20 mEq Oral BID  . pravastatin  20 mg Oral QPM  . sodium chloride  3 mL Intravenous Q12H   Infusions: . heparin 1,400 Units/hr (11/01/14 1900)  .  nitroGLYCERIN 15 mcg/min (11/01/14 1948)     Objective:  Blood pressure 114/66, pulse 62, temperature 98.3 F (36.8 C), temperature source Oral, resp. rate 18, height 5\' 7"  (1.702 m), weight 88 kg (194 lb 0.1 oz), SpO2 97 %. Weight change:   Physical Exam: General:  Elderly. Lying in bed No resp difficulty HEENT: normal Neck: supple. JVP 6 . Carotids 2+ bilat; no bruits. No lymphadenopathy or thryomegaly appreciated. Cor: PMI nondisplaced. Regular rate & rhythm. No rubs, gallops or murmurs. Lungs: clear Abdomen: soft, nontender, nondistended. No hepatosplenomegaly. No bruits or masses. Good bowel sounds. Extremities: no cyanosis, clubbing, rash, edema Neuro: alert & orientedx3, cranial nerves grossly intact. moves all 4 extremities w/o difficulty. Affect pleasant  Telemetry: SR 60-80s  Lab Results: Basic Metabolic Panel:  Recent Labs Lab 10/27/14 1434 11/01/14 0417 11/02/14 0225 11/02/14 0950  NA 139 141 139 138  K 3.5 3.1* 3.2* 3.3*  CL 105 104 106 104  CO2 27 25 23 24   GLUCOSE 108* 114* 129* 180*  BUN 22 12 13 14   CREATININE 0.87 0.87 0.91 0.93  CALCIUM 9.4 8.7 8.6 8.8   Liver Function Tests: No results for input(s): AST, ALT, ALKPHOS, BILITOT, PROT, ALBUMIN in the last 168 hours. No results for input(s): LIPASE, AMYLASE in the last 168 hours. No results for input(s):  AMMONIA in the last 168 hours. CBC:  Recent Labs Lab 10/27/14 1434 11/01/14 0417 11/02/14 0225  WBC 7.7 8.6 9.7  HGB 13.5 13.4 13.9  HCT 40.0 39.9 41.3  MCV 86.3 86.0 86.9  PLT 249.0 172 196   Cardiac Enzymes:  Recent Labs Lab 11/01/14 1040 11/01/14 1502 11/01/14 2056  TROPONINI <0.03 <0.03 <0.03   BNP: Invalid input(s): POCBNP CBG: No results for input(s): GLUCAP in the last 168 hours. Microbiology: No results found for: CULT No results for input(s): CULT, SDES in the last 168 hours.  Imaging: Dg Chest 2 View  11/01/2014   CLINICAL DATA:  80 year old male with a history of  shortness of breath  EXAM: CHEST - 2 VIEW  COMPARISON:  Contemporaneously chest CT 10/31/2014, chest x-ray 01/19/2014, 01/18/2014  FINDINGS: Cardiomediastinal silhouette projects within normal limits in size and contour. No confluent airspace disease, pneumothorax, or pleural effusion.  Stigmata of emphysema, with increased retrosternal airspace, flattened hemidiaphragms, increased AP diameter, and hyperinflation on the AP view.  Hiatal hernia.  Atherosclerotic calcifications of the aortic arch.  No displaced fracture.  Posttraumatic changes of the left clavicle.  Unremarkable appearance of the upper abdomen.  IMPRESSION: No radiographic evidence of acute cardiopulmonary disease.  Changes of emphysema.  Atherosclerosis.  Hiatal hernia.  Signed,  Dulcy Fanny. Earleen Newport, DO  [facility]  [facility]   Electronically Signed   By: Corrie Mckusick D.O.   On: 11/01/2014 07:47   Ct Chest Wo Contrast  11/01/2014   CLINICAL DATA:  Patient examined, coronary arteriograms reviewed with the patient's cardiologist-Dr. Burt Knack.13 -year-old Caucasian male with history of coronary disease status post drug-eluting stent in the LAD and RCA was admitted following cardiac catheterization today for increasing symptoms of chest pain with increased frequency and intensity consistent with unstable angina. Cardiac catheterization demonstrates a 95% left main stenosis, 50% stenosis of a dominant RCA, 80% mid LAD stenosis and 80% proximal circumflex stenosis. LVEF is fairly well preserved. The patient was admitted and placed on IV heparin and multivessel CABG was recommended. The patient denies any pain at rest. He denies symptoms of CHF. No previous history of peripheral vascular disease. No history of smoking. He denies diabetes. Positive family history for CAD.  EXAM: CT CHEST WITHOUT CONTRAST  TECHNIQUE: Multidetector CT imaging of the chest was performed following the standard protocol without IV  contrast.  COMPARISON:  Chest radiograph, 01/19/2014.  FINDINGS: Thoracic inlet:  No masses or adenopathy.  Mediastinum and hila: Heart is normal in size and configuration. There are dense coronary artery calcifications. Moderate hiatal hernia. There is some residual dense material consistent with contrast within the hiatal hernia and mid and lower esophagus. No mediastinal or hilar masses or pathologically enlarged lymph nodes. Great vessels are normal in caliber. Atherosclerotic calcifications noted along the thoracic aorta and its branch vessels.  Lungs and pleura: No lung consolidation or edema. No mass or suspicious nodule. Minor dependent reticular opacities are noted mostly in the lower lobes consistent subsegmental atelectasis. No pleural effusion or pneumothorax.  Limited upper abdomen: Fatty infiltration of the liver. No liver mass. No acute findings in the visualized upper abdomen.  Musculoskeletal: Mild disc degenerative changes throughout the visualized spine. No osteoblastic or osteolytic lesions.  IMPRESSION: 1. No acute findings. 2. Densely calcified coronary arteries consistent with the provided history. 3. Great vessels normal in caliber. Atherosclerotic calcifications along the thoracic aorta and its branch vessels. 4. Lungs essentially clear. 5. Hepatic steatosis.   Electronically  Signed   By: Lajean Manes M.D.   On: 11/01/2014 07:49     ASSESSMENT:  1. [place] 2. Severe CAD with 95% LM stenosis 3. DM 4. HTN 5. Hypokalemia  PLAN/DISCUSSION:  CP resolved on IV NTG. Will continue. Continue heparin, ASA and b-blocker.   CABG in am. Supp K+./   LOS: 2 days   Benay Spice 11:58 AM

## 2014-11-02 NOTE — Progress Notes (Signed)
ANTICOAGULATION CONSULT NOTE - Follow Up Consult  Pharmacy Consult for heparin Indication: chest pain/ACS  Allergies  Allergen Reactions  . Statins Other (See Comments)    Caused soreness (Lipitor, Zocor)  . Imodium [Loperamide] Itching and Rash    Patient Measurements: Height: 5\' 7"  (170.2 cm) Weight: 194 lb 0.1 oz (88 kg) IBW/kg (Calculated) : 66.1 Heparin Dosing Weight: 84kg  Vital Signs: Temp: 98.2 F (36.8 C) (04/24 0300) Temp Source: Oral (04/24 0300) BP: 111/54 mmHg (04/24 0600) Pulse Rate: 64 (04/24 0600)  Labs:  Recent Labs  11/01/14 0417 11/01/14 1040 11/01/14 1258 11/01/14 1502 11/01/14 2056 11/01/14 2208 11/02/14 0225  HGB 13.4  --   --   --   --   --  13.9  HCT 39.9  --   --   --   --   --  41.3  PLT 172  --   --   --   --   --  196  HEPARINUNFRC 0.11*  --  0.28*  --   --  0.47 0.41  CREATININE 0.87  --   --   --   --   --  0.91  TROPONINI  --  <0.03  --  <0.03 <0.03  --   --     Estimated Creatinine Clearance: 72 mL/min (by C-G formula based on Cr of 0.91).   Medications:  Infusions:  . heparin 1,400 Units/hr (11/01/14 1900)  . nitroGLYCERIN 15 mcg/min (11/01/14 1948)    Assessment: 79yoM admitted with CP, s/p cardiac cath. Found to have severe distal left main stenosis. Heparin started post cath due to his high risk anatomy. Heparin levels therapeutic x 2 (0.47, 0.41). Hgb 13.9, plts 196 - stable - no bleeding noted.  Goal of Therapy:  Heparin level 0.3-0.7 units/ml Monitor platelets by anticoagulation protocol: Yes   Plan:  Continue heparin at 1400 units/hr Daily HL/CBC Monitor s/sx of bleeding F/u plans for CABG on 4/25  Thank you for allowing pharmacy to be part of this patient's care team  [place], Pharm.D Clinical Pharmacy Resident Pager: [phone] 11/02/2014 .7:48 AM

## 2014-11-03 ENCOUNTER — Inpatient Hospital Stay (HOSPITAL_COMMUNITY): Payer: Medicare HMO | Admitting: Anesthesiology

## 2014-11-03 ENCOUNTER — Encounter (HOSPITAL_COMMUNITY): Payer: Self-pay | Admitting: Certified Registered Nurse Anesthetist

## 2014-11-03 ENCOUNTER — Inpatient Hospital Stay (HOSPITAL_COMMUNITY): Payer: Medicare HMO

## 2014-11-03 ENCOUNTER — Encounter (HOSPITAL_COMMUNITY)
Admission: RE | Disposition: A | Discharge status: Home / Self Care | Payer: Medicare HMO | Source: Ambulatory Visit | Attending: Cardiothoracic Surgery

## 2014-11-03 DIAGNOSIS — Z951 Presence of aortocoronary bypass graft: Secondary | ICD-10-CM

## 2014-11-03 HISTORY — PX: CORONARY ARTERY BYPASS GRAFT: SHX141

## 2014-11-03 HISTORY — DX: Presence of aortocoronary bypass graft: Z95.1

## 2014-11-03 HISTORY — PX: TEE WITHOUT CARDIOVERSION: SHX5443

## 2014-11-03 LAB — POCT I-STAT, CHEM 8
BUN: 12 mg/dL (ref 6–23)
BUN: 11 mg/dL (ref 6–23)
BUN: 9 mg/dL (ref 6–23)
BUN: 10 mg/dL (ref 6–23)
BUN: 10 mg/dL (ref 6–23)
BUN: 9 mg/dL (ref 6–23)
BUN: 11 mg/dL (ref 6–23)
CALCIUM ION: 1.11 mmol/L — AB (ref 1.13–1.30)
CHLORIDE: 105 mmol/L (ref 96–112)
CHLORIDE: 102 mmol/L (ref 96–112)
CREATININE: 0.6 mg/dL (ref 0.50–1.35)
Calcium, Ion: 1.21 mmol/L (ref 1.13–1.30)
Calcium, Ion: 1.19 mmol/L (ref 1.13–1.30)
Calcium, Ion: 0.99 mmol/L — ABNORMAL LOW (ref 1.13–1.30)
Calcium, Ion: 1.08 mmol/L — ABNORMAL LOW (ref 1.13–1.30)
Calcium, Ion: 1.34 mmol/L — ABNORMAL HIGH (ref 1.13–1.30)
Calcium, Ion: 1.22 mmol/L (ref 1.13–1.30)
Chloride: 105 mmol/L (ref 96–112)
Chloride: 98 mmol/L (ref 96–112)
Chloride: 104 mmol/L (ref 96–112)
Chloride: 103 mmol/L (ref 96–112)
Chloride: 81 mmol/L — ABNORMAL LOW (ref 96–112)
Creatinine, Ser: 0.7 mg/dL (ref 0.50–1.35)
Creatinine, Ser: 0.7 mg/dL (ref 0.50–1.35)
Creatinine, Ser: 0.6 mg/dL (ref 0.50–1.35)
Creatinine, Ser: 0.7 mg/dL (ref 0.50–1.35)
Creatinine, Ser: 0.7 mg/dL (ref 0.50–1.35)
Creatinine, Ser: 0.6 mg/dL (ref 0.50–1.35)
Glucose, Bld: 141 mg/dL — ABNORMAL HIGH (ref 70–99)
Glucose, Bld: 138 mg/dL — ABNORMAL HIGH (ref 70–99)
Glucose, Bld: 120 mg/dL — ABNORMAL HIGH (ref 70–99)
Glucose, Bld: 166 mg/dL — ABNORMAL HIGH (ref 70–99)
Glucose, Bld: 185 mg/dL — ABNORMAL HIGH (ref 70–99)
Glucose, Bld: 172 mg/dL — ABNORMAL HIGH (ref 70–99)
Glucose, Bld: 178 mg/dL — ABNORMAL HIGH (ref 70–99)
HCT: 39 % (ref 39.0–52.0)
HCT: 38 % — ABNORMAL LOW (ref 39.0–52.0)
HCT: 29 % — ABNORMAL LOW (ref 39.0–52.0)
HCT: 33 % — ABNORMAL LOW (ref 39.0–52.0)
HCT: 33 % — ABNORMAL LOW (ref 39.0–52.0)
HCT: 31 % — ABNORMAL LOW (ref 39.0–52.0)
HCT: 35 % — ABNORMAL LOW (ref 39.0–52.0)
HEMOGLOBIN: 12.9 g/dL — AB (ref 13.0–17.0)
HEMOGLOBIN: 9.9 g/dL — AB (ref 13.0–17.0)
HEMOGLOBIN: 11.2 g/dL — AB (ref 13.0–17.0)
HEMOGLOBIN: 11.2 g/dL — AB (ref 13.0–17.0)
HEMOGLOBIN: 10.5 g/dL — AB (ref 13.0–17.0)
Hemoglobin: 13.3 g/dL (ref 13.0–17.0)
Hemoglobin: 11.9 g/dL — ABNORMAL LOW (ref 13.0–17.0)
POTASSIUM: 3.9 mmol/L (ref 3.5–5.1)
POTASSIUM: 5 mmol/L (ref 3.5–5.1)
POTASSIUM: 4.7 mmol/L (ref 3.5–5.1)
Potassium: 3.8 mmol/L (ref 3.5–5.1)
Potassium: 3.9 mmol/L (ref 3.5–5.1)
Potassium: 3.2 mmol/L — ABNORMAL LOW (ref 3.5–5.1)
Potassium: 4.3 mmol/L (ref 3.5–5.1)
SODIUM: 136 mmol/L (ref 135–145)
SODIUM: 136 mmol/L (ref 135–145)
SODIUM: 141 mmol/L (ref 135–145)
SODIUM: 141 mmol/L (ref 135–145)
Sodium: 138 mmol/L (ref 135–145)
Sodium: 139 mmol/L (ref 135–145)
Sodium: 136 mmol/L (ref 135–145)
TCO2: 20 mmol/L (ref 0–100)
TCO2: 20 mmol/L (ref 0–100)
TCO2: 19 mmol/L (ref 0–100)
TCO2: 19 mmol/L (ref 0–100)
TCO2: 19 mmol/L (ref 0–100)
TCO2: 21 mmol/L (ref 0–100)
TCO2: 19 mmol/L (ref 0–100)

## 2014-11-03 LAB — CREATININE, SERUM
Creatinine, Ser: 0.83 mg/dL (ref 0.50–1.35)
GFR calc Af Amer: >90 mL/min (ref >90)
GFR calc non Af Amer: 83 mL/min — ABNORMAL LOW (ref >90)

## 2014-11-03 LAB — POCT I-STAT 3, ART BLOOD GAS (G3+)
ACID-BASE DEFICIT: 5 mmol/L — AB (ref 0.0–2.0)
ACID-BASE DEFICIT: 4 mmol/L — AB (ref 0.0–2.0)
ACID-BASE DEFICIT: 3 mmol/L — AB (ref 0.0–2.0)
Acid-base deficit: 4 mmol/L — ABNORMAL HIGH (ref 0.0–2.0)
Acid-base deficit: 7 mmol/L — ABNORMAL HIGH (ref 0.0–2.0)
BICARBONATE: 21.2 meq/L (ref 20.0–24.0)
BICARBONATE: 23.9 meq/L (ref 20.0–24.0)
Bicarbonate: 20.8 mEq/L (ref 20.0–24.0)
Bicarbonate: 19.9 mEq/L — ABNORMAL LOW (ref 20.0–24.0)
Bicarbonate: 22 mEq/L (ref 20.0–24.0)
O2 SAT: 95 %
O2 SAT: 95 %
O2 SAT: 92 %
O2 Saturation: 100 %
O2 Saturation: 99 %
PH ART: 7.331 — AB (ref 7.350–7.450)
PH ART: 7.302 — AB (ref 7.350–7.450)
PH ART: 7.286 — AB (ref 7.350–7.450)
PO2 ART: 266 mmHg — AB (ref 80.0–100.0)
PO2 ART: 90 mmHg (ref 80.0–100.0)
PO2 ART: 73 mmHg — AB (ref 80.0–100.0)
TCO2: 22 mmol/L (ref 0–100)
TCO2: 22 mmol/L (ref 0–100)
TCO2: 21 mmol/L (ref 0–100)
TCO2: 23 mmol/L (ref 0–100)
TCO2: 25 mmol/L (ref 0–100)
pCO2 arterial: 37 mmHg (ref 35.0–45.0)
pCO2 arterial: 39.4 mmHg (ref 35.0–45.0)
pCO2 arterial: 44.1 mmHg (ref 35.0–45.0)
pCO2 arterial: 44.6 mmHg (ref 35.0–45.0)
pCO2 arterial: 50.1 mmHg — ABNORMAL HIGH (ref 35.0–45.0)
pH, Arterial: 7.366 (ref 7.350–7.450)
pH, Arterial: 7.262 — ABNORMAL LOW (ref 7.350–7.450)
pO2, Arterial: 82 mmHg (ref 80.0–100.0)
pO2, Arterial: 151 mmHg — ABNORMAL HIGH (ref 80.0–100.0)

## 2014-11-03 LAB — GLUCOSE, CAPILLARY: Glucose-Capillary: 137 mg/dL — ABNORMAL HIGH (ref 70–99)

## 2014-11-03 LAB — BASIC METABOLIC PANEL
ANION GAP: 11 (ref 5–15)
Anion gap: 7 (ref 5–15)
BUN: 12 mg/dL (ref 6–23)
BUN: 11 mg/dL (ref 6–23)
CO2: 22 mmol/L (ref 19–32)
CO2: 23 mmol/L (ref 19–32)
CREATININE: 0.88 mg/dL (ref 0.50–1.35)
Calcium: 8.4 mg/dL (ref 8.4–10.5)
Calcium: 8.2 mg/dL — ABNORMAL LOW (ref 8.4–10.5)
Chloride: 103 mmol/L (ref 96–112)
Chloride: 108 mmol/L (ref 96–112)
Creatinine, Ser: 0.84 mg/dL (ref 0.50–1.35)
GFR calc Af Amer: >90 mL/min (ref >90)
GFR calc non Af Amer: 81 mL/min — ABNORMAL LOW (ref >90)
GFR calc non Af Amer: 82 mL/min — ABNORMAL LOW (ref >90)
GLUCOSE: 141 mg/dL — AB (ref 70–99)
Glucose, Bld: 172 mg/dL — ABNORMAL HIGH (ref 70–99)
POTASSIUM: 3.6 mmol/L (ref 3.5–5.1)
Potassium: 4.4 mmol/L (ref 3.5–5.1)
Sodium: 136 mmol/L (ref 135–145)
Sodium: 138 mmol/L (ref 135–145)

## 2014-11-03 LAB — CBC
HCT: 40.4 % (ref 39.0–52.0)
HCT: 36.9 % — ABNORMAL LOW (ref 39.0–52.0)
HCT: 34.1 % — ABNORMAL LOW (ref 39.0–52.0)
HEMOGLOBIN: 12.7 g/dL — AB (ref 13.0–17.0)
Hemoglobin: 13.7 g/dL (ref 13.0–17.0)
Hemoglobin: 11.6 g/dL — ABNORMAL LOW (ref 13.0–17.0)
MCH: 29.6 pg (ref 26.0–34.0)
MCH: 29.5 pg (ref 26.0–34.0)
MCH: 29 pg (ref 26.0–34.0)
MCHC: 33.9 g/dL (ref 30.0–36.0)
MCHC: 34.4 g/dL (ref 30.0–36.0)
MCHC: 34 g/dL (ref 30.0–36.0)
MCV: 87.3 fL (ref 78.0–100.0)
MCV: 85.8 fL (ref 78.0–100.0)
MCV: 85.3 fL (ref 78.0–100.0)
PLATELETS: 187 10*3/uL (ref 150–400)
Platelets: 116 10*3/uL — ABNORMAL LOW (ref 150–400)
Platelets: 129 10*3/uL — ABNORMAL LOW (ref 150–400)
RBC: 4.63 MIL/uL (ref 4.22–5.81)
RBC: 4.3 MIL/uL (ref 4.22–5.81)
RBC: 4 MIL/uL — ABNORMAL LOW (ref 4.22–5.81)
RDW: 13.6 % (ref 11.5–15.5)
RDW: 13.4 % (ref 11.5–15.5)
RDW: 13.5 % (ref 11.5–15.5)
WBC: 9.4 10*3/uL (ref 4.0–10.5)
WBC: 11 10*3/uL — ABNORMAL HIGH (ref 4.0–10.5)
WBC: 12.2 10*3/uL — ABNORMAL HIGH (ref 4.0–10.5)

## 2014-11-03 LAB — PLATELET COUNT: Platelets: 130 10*3/uL — ABNORMAL LOW (ref 150–400)

## 2014-11-03 LAB — HEMOGLOBIN AND HEMATOCRIT, BLOOD
HCT: 30.6 % — ABNORMAL LOW (ref 39.0–52.0)
Hemoglobin: 10.5 g/dL — ABNORMAL LOW (ref 13.0–17.0)

## 2014-11-03 LAB — APTT: aPTT: 38 seconds — ABNORMAL HIGH (ref 24–37)

## 2014-11-03 LAB — HEMOGLOBIN A1C
Hgb A1c MFr Bld: 6.9 % — ABNORMAL HIGH (ref 4.8–5.6)
Mean Plasma Glucose: 151 mg/dL

## 2014-11-03 LAB — POCT I-STAT 4, (NA,K, GLUC, HGB,HCT)
Glucose, Bld: 140 mg/dL — ABNORMAL HIGH (ref 70–99)
HCT: 36 % — ABNORMAL LOW (ref 39.0–52.0)
HEMOGLOBIN: 12.2 g/dL — AB (ref 13.0–17.0)
Potassium: 3.5 mmol/L (ref 3.5–5.1)
Sodium: 142 mmol/L (ref 135–145)

## 2014-11-03 LAB — POCT I-STAT GLUCOSE
GLUCOSE: 153 mg/dL — AB (ref 70–99)
Glucose, Bld: 143 mg/dL — ABNORMAL HIGH (ref 70–99)
OPERATOR ID: 295891
OPERATOR ID: 3406

## 2014-11-03 LAB — HEPARIN LEVEL (UNFRACTIONATED): Heparin Unfractionated: 0.35 IU/mL (ref 0.30–0.70)

## 2014-11-03 LAB — MAGNESIUM: Magnesium: 2.9 mg/dL — ABNORMAL HIGH (ref 1.5–2.5)

## 2014-11-03 LAB — PROTIME-INR
INR: 1.47 (ref 0.00–1.49)
PROTHROMBIN TIME: 17.9 s — AB (ref 11.6–15.2)

## 2014-11-03 SURGERY — CORONARY ARTERY BYPASS GRAFTING (CABG)
Anesthesia: General | Site: Chest

## 2014-11-03 MED ORDER — ACETAMINOPHEN 650 MG RE SUPP
650.0000 mg | Freq: Once | RECTAL | Status: AC
  Administered 2014-11-03: 650 mg via RECTAL

## 2014-11-03 MED ORDER — LACTATED RINGERS IV SOLN
INTRAVENOUS | Status: DC | PRN
  Administered 2014-11-03: 07:00:00 via INTRAVENOUS

## 2014-11-03 MED ORDER — LACTATED RINGERS IV SOLN: INTRAVENOUS | Status: DC

## 2014-11-03 MED ORDER — DEXTROSE 5 % IV SOLN
10.0000 mg | INTRAVENOUS | Status: DC | PRN
  Administered 2014-11-03: 20 ug/min via INTRAVENOUS

## 2014-11-03 MED ORDER — ROCURONIUM BROMIDE 100 MG/10ML IV SOLN
INTRAVENOUS | Status: DC | PRN
  Administered 2014-11-03 (×6): 50 mg via INTRAVENOUS

## 2014-11-03 MED ORDER — BISACODYL 5 MG PO TBEC
10.0000 mg | DELAYED_RELEASE_TABLET | Freq: Every day | ORAL | Status: DC
  Administered 2014-11-04 – 2014-11-09 (×4): 10 mg via ORAL
  Filled 2014-11-03 (×4): qty 2

## 2014-11-03 MED ORDER — MORPHINE SULFATE 2 MG/ML IJ SOLN
2.0000 mg | INTRAMUSCULAR | Status: DC | PRN
  Administered 2014-11-04 (×3): 2 mg via INTRAVENOUS
  Filled 2014-11-03 (×3): qty 1

## 2014-11-03 MED ORDER — MIDAZOLAM HCL 5 MG/5ML IJ SOLN
INTRAMUSCULAR | Status: DC | PRN
  Administered 2014-11-03: 1 mg via INTRAVENOUS
  Administered 2014-11-03: 5 mg via INTRAVENOUS
  Administered 2014-11-03 (×2): 2 mg via INTRAVENOUS
  Administered 2014-11-03: 3 mg via INTRAVENOUS
  Administered 2014-11-03: 1 mg via INTRAVENOUS
  Administered 2014-11-03: 4 mg via INTRAVENOUS
  Administered 2014-11-03: 2 mg via INTRAVENOUS

## 2014-11-03 MED ORDER — LACTATED RINGERS IV SOLN
INTRAVENOUS | Status: DC | PRN
  Administered 2014-11-03 (×2): via INTRAVENOUS

## 2014-11-03 MED ORDER — HEMOSTATIC AGENTS (NO CHARGE) OPTIME
TOPICAL | Status: DC | PRN
  Administered 2014-11-03 (×2): 1 via TOPICAL

## 2014-11-03 MED ORDER — PROTAMINE SULFATE 10 MG/ML IV SOLN
INTRAVENOUS | Status: DC | PRN
  Administered 2014-11-03: 40 mg via INTRAVENOUS
  Administered 2014-11-03: 80 mg via INTRAVENOUS
  Administered 2014-11-03: 50 mg via INTRAVENOUS
  Administered 2014-11-03: 30 mg via INTRAVENOUS
  Administered 2014-11-03: 20 mg via INTRAVENOUS
  Administered 2014-11-03: 60 mg via INTRAVENOUS

## 2014-11-03 MED ORDER — POTASSIUM CHLORIDE 10 MEQ/50ML IV SOLN
10.0000 meq | INTRAVENOUS | Status: AC
  Administered 2014-11-03 (×3): 10 meq via INTRAVENOUS

## 2014-11-03 MED ORDER — DEXTROSE 5 % IV SOLN: 1.5000 g | Freq: Two times a day (BID) | INTRAVENOUS | Status: DC

## 2014-11-03 MED ORDER — PHENYLEPHRINE HCL 10 MG/ML IJ SOLN: 0.0000 ug/min | INTRAVENOUS | Status: DC

## 2014-11-03 MED ORDER — CHLORHEXIDINE GLUCONATE 0.12 % MT SOLN
15.0000 mL | Freq: Two times a day (BID) | OROMUCOSAL | Status: DC
  Administered 2014-11-03 – 2014-11-04 (×2): 15 mL via OROMUCOSAL
  Filled 2014-11-03: qty 15

## 2014-11-03 MED ORDER — SODIUM CHLORIDE 0.9 % IV SOLN
INTRAVENOUS | Status: DC
  Administered 2014-11-03: 23:00:00 via INTRAVENOUS
  Filled 2014-11-03: qty 2.5

## 2014-11-03 MED ORDER — FENTANYL CITRATE (PF) 100 MCG/2ML IJ SOLN
INTRAMUSCULAR | Status: DC | PRN
  Administered 2014-11-03: 200 ug via INTRAVENOUS
  Administered 2014-11-03: 50 ug via INTRAVENOUS
  Administered 2014-11-03: 250 ug via INTRAVENOUS
  Administered 2014-11-03 (×2): 50 ug via INTRAVENOUS
  Administered 2014-11-03: 150 ug via INTRAVENOUS
  Administered 2014-11-03: 100 ug via INTRAVENOUS
  Administered 2014-11-03: 150 ug via INTRAVENOUS
  Administered 2014-11-03: 200 ug via INTRAVENOUS
  Administered 2014-11-03: 150 ug via INTRAVENOUS
  Administered 2014-11-03: 200 ug via INTRAVENOUS
  Administered 2014-11-03: 150 ug via INTRAVENOUS
  Administered 2014-11-03: 50 ug via INTRAVENOUS

## 2014-11-03 MED ORDER — ASPIRIN 81 MG PO CHEW: 324.0000 mg | CHEWABLE_TABLET | Freq: Every day | ORAL | Status: DC

## 2014-11-03 MED ORDER — SODIUM CHLORIDE 0.9 % IV SOLN: INTRAVENOUS | Status: DC

## 2014-11-03 MED ORDER — LEVOFLOXACIN IN D5W 500 MG/100ML IV SOLN
500.0000 mg | INTRAVENOUS | Status: DC
  Filled 2014-11-03: qty 100

## 2014-11-03 MED ORDER — DOCUSATE SODIUM 100 MG PO CAPS
200.0000 mg | ORAL_CAPSULE | Freq: Every day | ORAL | Status: DC
  Administered 2014-11-04 – 2014-11-09 (×6): 200 mg via ORAL
  Filled 2014-11-03 (×6): qty 2

## 2014-11-03 MED ORDER — LEVOFLOXACIN IN D5W 500 MG/100ML IV SOLN: 500.0000 mg | INTRAVENOUS | Status: DC

## 2014-11-03 MED ORDER — CALCIUM CHLORIDE 10 % IV SOLN
INTRAVENOUS | Status: DC | PRN
  Administered 2014-11-03 (×2): 300 mg via INTRAVENOUS
  Administered 2014-11-03 (×2): 200 mg via INTRAVENOUS

## 2014-11-03 MED ORDER — METOPROLOL TARTRATE 12.5 MG HALF TABLET
12.5000 mg | ORAL_TABLET | Freq: Two times a day (BID) | ORAL | Status: DC
  Administered 2014-11-04 – 2014-11-06 (×6): 12.5 mg via ORAL
  Filled 2014-11-03 (×10): qty 1

## 2014-11-03 MED ORDER — CETYLPYRIDINIUM CHLORIDE 0.05 % MT LIQD
7.0000 mL | Freq: Four times a day (QID) | OROMUCOSAL | Status: DC
  Administered 2014-11-04 (×2): 7 mL via OROMUCOSAL

## 2014-11-03 MED ORDER — PROPOFOL 10 MG/ML IV BOLUS
INTRAVENOUS | Status: DC | PRN
  Administered 2014-11-03: 100 mg via INTRAVENOUS

## 2014-11-03 MED ORDER — DEXMEDETOMIDINE HCL IN NACL 200 MCG/50ML IV SOLN: 0.0000 ug/kg/h | INTRAVENOUS | Status: DC

## 2014-11-03 MED ORDER — DIPHENHYDRAMINE HCL 50 MG/ML IJ SOLN
INTRAMUSCULAR | Status: DC | PRN
  Administered 2014-11-03: 25 mg via INTRAVENOUS

## 2014-11-03 MED ORDER — SODIUM CHLORIDE 0.9 % IJ SOLN: 3.0000 mL | INTRAMUSCULAR | Status: DC | PRN

## 2014-11-03 MED ORDER — PHENYLEPHRINE HCL 10 MG/ML IJ SOLN
20.0000 mg | INTRAVENOUS | Status: DC | PRN
  Administered 2014-11-03: 20 ug/min via INTRAVENOUS

## 2014-11-03 MED ORDER — MAGNESIUM SULFATE 4 GM/100ML IV SOLN
4.0000 g | Freq: Once | INTRAVENOUS | Status: AC
  Administered 2014-11-03: 4 g via INTRAVENOUS
  Filled 2014-11-03: qty 100

## 2014-11-03 MED ORDER — SODIUM CHLORIDE 0.9 % IV SOLN: 250.0000 mL | INTRAVENOUS | Status: DC

## 2014-11-03 MED ORDER — TRAMADOL HCL 50 MG PO TABS
50.0000 mg | ORAL_TABLET | ORAL | Status: DC | PRN
  Administered 2014-11-04: 50 mg via ORAL
  Filled 2014-11-03: qty 1

## 2014-11-03 MED ORDER — VANCOMYCIN HCL IN DEXTROSE 1-5 GM/200ML-% IV SOLN
1000.0000 mg | Freq: Once | INTRAVENOUS | Status: AC
  Administered 2014-11-03: 1000 mg via INTRAVENOUS
  Filled 2014-11-03: qty 200

## 2014-11-03 MED ORDER — HEPARIN SODIUM (PORCINE) 1000 UNIT/ML IJ SOLN
INTRAMUSCULAR | Status: DC | PRN
  Administered 2014-11-03: 2000 [IU] via INTRAVENOUS
  Administered 2014-11-03: 25000 [IU] via INTRAVENOUS
  Administered 2014-11-03: 3000 [IU] via INTRAVENOUS

## 2014-11-03 MED ORDER — POTASSIUM CHLORIDE 10 MEQ/50ML IV SOLN
10.0000 meq | Freq: Once | INTRAVENOUS | Status: AC
  Administered 2014-11-03: 10 meq via INTRAVENOUS

## 2014-11-03 MED ORDER — SODIUM BICARBONATE 4.2 % IV SOLN
INTRAVENOUS | Status: DC | PRN
  Administered 2014-11-03: 50 meq via INTRAVENOUS

## 2014-11-03 MED ORDER — FAMOTIDINE IN NACL 20-0.9 MG/50ML-% IV SOLN
20.0000 mg | Freq: Two times a day (BID) | INTRAVENOUS | Status: AC
  Administered 2014-11-03 – 2014-11-04 (×2): 20 mg via INTRAVENOUS
  Filled 2014-11-03: qty 50

## 2014-11-03 MED ORDER — LACTATED RINGERS IV SOLN
INTRAVENOUS | Status: DC | PRN
  Administered 2014-11-03 (×2): via INTRAVENOUS

## 2014-11-03 MED ORDER — SODIUM CHLORIDE 0.9 % IV SOLN
0.5000 g/h | INTRAVENOUS | Status: DC
  Filled 2014-11-03: qty 20

## 2014-11-03 MED ORDER — NOREPINEPHRINE BITARTRATE 1 MG/ML IV SOLN
0.0000 ug/min | INTRAVENOUS | Status: DC
  Administered 2014-11-03: 2 ug/min via INTRAVENOUS
  Filled 2014-11-03: qty 4

## 2014-11-03 MED ORDER — LACTATED RINGERS IV SOLN: 500.0000 mL | Freq: Once | INTRAVENOUS | Status: AC | PRN

## 2014-11-03 MED ORDER — OXYCODONE HCL 5 MG PO TABS
5.0000 mg | ORAL_TABLET | ORAL | Status: DC | PRN
  Administered 2014-11-04 – 2014-11-05 (×3): 10 mg via ORAL
  Administered 2014-11-06 – 2014-11-07 (×4): 5 mg via ORAL
  Filled 2014-11-03: qty 1
  Filled 2014-11-03 (×2): qty 2
  Filled 2014-11-03: qty 1
  Filled 2014-11-03: qty 2
  Filled 2014-11-03 (×3): qty 1

## 2014-11-03 MED ORDER — LIDOCAINE HCL (CARDIAC) 20 MG/ML IV SOLN
INTRAVENOUS | Status: DC | PRN
  Administered 2014-11-03: 80 mg via INTRAVENOUS

## 2014-11-03 MED ORDER — EPINEPHRINE HCL 0.1 MG/ML IJ SOSY
PREFILLED_SYRINGE | INTRAMUSCULAR | Status: DC | PRN
  Administered 2014-11-03: 0.1 mg via INTRAVENOUS

## 2014-11-03 MED ORDER — ONDANSETRON HCL 4 MG/2ML IJ SOLN
4.0000 mg | Freq: Four times a day (QID) | INTRAMUSCULAR | Status: DC | PRN
  Administered 2014-11-05: 4 mg via INTRAVENOUS
  Filled 2014-11-03: qty 2

## 2014-11-03 MED ORDER — SODIUM CHLORIDE 0.9 % IV SOLN
INTRAVENOUS | Status: DC | PRN
  Administered 2014-11-03: 14:00:00 via INTRAVENOUS

## 2014-11-03 MED ORDER — ALBUMIN HUMAN 5 % IV SOLN
INTRAVENOUS | Status: DC | PRN
  Administered 2014-11-03 (×3): via INTRAVENOUS

## 2014-11-03 MED ORDER — NITROGLYCERIN IN D5W 200-5 MCG/ML-% IV SOLN: 0.0000 ug/min | INTRAVENOUS | Status: DC

## 2014-11-03 MED ORDER — BISACODYL 10 MG RE SUPP: 10.0000 mg | Freq: Every day | RECTAL | Status: DC

## 2014-11-03 MED ORDER — FENTANYL CITRATE (PF) 100 MCG/2ML IJ SOLN: 25.0000 ug | INTRAMUSCULAR | Status: DC | PRN

## 2014-11-03 MED ORDER — ARTIFICIAL TEARS OP OINT
TOPICAL_OINTMENT | OPHTHALMIC | Status: DC | PRN
Start: 2014-11-03 — End: 2014-11-03
  Administered 2014-11-03: 1 via OPHTHALMIC

## 2014-11-03 MED ORDER — METOPROLOL TARTRATE 25 MG/10 ML ORAL SUSPENSION
12.5000 mg | Freq: Two times a day (BID) | ORAL | Status: DC
  Filled 2014-11-03 (×9): qty 5

## 2014-11-03 MED ORDER — LEVOFLOXACIN IN D5W 750 MG/150ML IV SOLN
750.0000 mg | INTRAVENOUS | Status: AC
  Administered 2014-11-04 – 2014-11-05 (×2): 750 mg via INTRAVENOUS
  Filled 2014-11-03 (×2): qty 150

## 2014-11-03 MED ORDER — ACETAMINOPHEN 500 MG PO TABS
1000.0000 mg | ORAL_TABLET | Freq: Four times a day (QID) | ORAL | Status: AC
  Administered 2014-11-04 – 2014-11-08 (×19): 1000 mg via ORAL
  Filled 2014-11-03 (×18): qty 2

## 2014-11-03 MED ORDER — ASPIRIN EC 325 MG PO TBEC
325.0000 mg | DELAYED_RELEASE_TABLET | Freq: Every day | ORAL | Status: DC
  Administered 2014-11-04: 325 mg via ORAL
  Filled 2014-11-03: qty 1

## 2014-11-03 MED ORDER — ACETAMINOPHEN 160 MG/5ML PO SOLN: 650.0000 mg | Freq: Once | ORAL | Status: AC

## 2014-11-03 MED ORDER — DOPAMINE-DEXTROSE 3.2-5 MG/ML-% IV SOLN
INTRAVENOUS | Status: DC | PRN
  Administered 2014-11-03: 3 ug/kg/min via INTRAVENOUS

## 2014-11-03 MED ORDER — MILRINONE IN DEXTROSE 20 MG/100ML IV SOLN
0.2500 ug/kg/min | Freq: Once | INTRAVENOUS | Status: AC
  Administered 2014-11-03: 0.25 ug/kg/min via INTRAVENOUS
  Filled 2014-11-03: qty 100

## 2014-11-03 MED ORDER — 0.9 % SODIUM CHLORIDE (POUR BTL) OPTIME
TOPICAL | Status: DC | PRN
Start: 2014-11-03 — End: 2014-11-03
  Administered 2014-11-03: 6000 mL

## 2014-11-03 MED ORDER — MIDAZOLAM HCL 2 MG/2ML IJ SOLN: 2.0000 mg | INTRAMUSCULAR | Status: DC | PRN

## 2014-11-03 MED ORDER — SODIUM CHLORIDE 0.45 % IV SOLN
INTRAVENOUS | Status: DC | PRN
  Administered 2014-11-03: 16:00:00 via INTRAVENOUS

## 2014-11-03 MED ORDER — SODIUM CHLORIDE 0.9 % IJ SOLN
3.0000 mL | Freq: Two times a day (BID) | INTRAMUSCULAR | Status: DC
  Administered 2014-11-04: 3 mL via INTRAVENOUS
  Administered 2014-11-04: 10 mL via INTRAVENOUS
  Administered 2014-11-04: 3 mL via INTRAVENOUS
  Administered 2014-11-05: 10 mL via INTRAVENOUS
  Administered 2014-11-05 – 2014-11-09 (×7): 3 mL via INTRAVENOUS

## 2014-11-03 MED ORDER — METHYLPREDNISOLONE SODIUM SUCC 125 MG IJ SOLR
INTRAMUSCULAR | Status: DC | PRN
  Administered 2014-11-03: 125 mg via INTRAVENOUS

## 2014-11-03 MED ORDER — SODIUM CHLORIDE 0.9 % IJ SOLN
OROMUCOSAL | Status: DC | PRN
  Administered 2014-11-03 (×3): 4 mL via TOPICAL

## 2014-11-03 MED ORDER — INSULIN REGULAR BOLUS VIA INFUSION
0.0000 [IU] | Freq: Three times a day (TID) | INTRAVENOUS | Status: DC
  Administered 2014-11-04: 4 [IU] via INTRAVENOUS
  Filled 2014-11-03: qty 10

## 2014-11-03 MED ORDER — PANTOPRAZOLE SODIUM 40 MG PO TBEC
40.0000 mg | DELAYED_RELEASE_TABLET | Freq: Every day | ORAL | Status: DC
  Administered 2014-11-05 – 2014-11-09 (×5): 40 mg via ORAL
  Filled 2014-11-03 (×4): qty 1

## 2014-11-03 MED ORDER — MORPHINE SULFATE 2 MG/ML IJ SOLN: 1.0000 mg | INTRAMUSCULAR | Status: AC | PRN

## 2014-11-03 MED ORDER — METOPROLOL TARTRATE 1 MG/ML IV SOLN: 2.5000 mg | INTRAVENOUS | Status: DC | PRN

## 2014-11-03 MED ORDER — ALBUMIN HUMAN 5 % IV SOLN
250.0000 mL | INTRAVENOUS | Status: DC | PRN
  Administered 2014-11-03: 250 mL via INTRAVENOUS

## 2014-11-03 MED ORDER — ACETAMINOPHEN 160 MG/5ML PO SOLN
1000.0000 mg | Freq: Four times a day (QID) | ORAL | Status: AC
  Filled 2014-11-03: qty 40

## 2014-11-03 MED ORDER — PAPAVERINE HCL 30 MG/ML IJ SOLN
INTRAMUSCULAR | Status: DC | PRN
  Administered 2014-11-03: 500 mL via INTRAVASCULAR

## 2014-11-03 MED FILL — Sodium Chloride IV Soln 0.9%: INTRAVENOUS | Qty: 2000 | Status: AC

## 2014-11-03 MED FILL — Heparin Sodium (Porcine) Inj 1000 Unit/ML: INTRAMUSCULAR | Qty: 20 | Status: AC

## 2014-11-03 MED FILL — Electrolyte-R (PH 7.4) Solution: INTRAVENOUS | Qty: 3000 | Status: AC

## 2014-11-03 MED FILL — Sodium Bicarbonate IV Soln 8.4%: INTRAVENOUS | Qty: 50 | Status: AC

## 2014-11-03 MED FILL — Lidocaine HCl IV Inj 20 MG/ML: INTRAVENOUS | Qty: 5 | Status: AC

## 2014-11-03 MED FILL — Mannitol IV Soln 20%: INTRAVENOUS | Qty: 500 | Status: AC

## 2014-11-03 SURGICAL SUPPLY — 94 items
ADAPTER CARDIO PERF ANTE/RETRO (ADAPTER) ×4 IMPLANT
ADPR PRFSN 84XANTGRD RTRGD (ADAPTER) ×2
BAG DECANTER FOR FLEXI CONT (MISCELLANEOUS) ×4 IMPLANT
BANDAGE ELASTIC 4 VELCRO ST LF (GAUZE/BANDAGES/DRESSINGS) ×4 IMPLANT
BANDAGE ELASTIC 6 VELCRO ST LF (GAUZE/BANDAGES/DRESSINGS) ×4 IMPLANT
BASKET HEART  (ORDER IN 25'S) (MISCELLANEOUS) ×1
BASKET HEART (ORDER IN 25'S) (MISCELLANEOUS) ×1
BASKET HEART (ORDER IN 25S) (MISCELLANEOUS) ×2 IMPLANT
BLADE STERNUM SYSTEM 6 (BLADE) ×4 IMPLANT
BLADE SURG 12 STRL SS (BLADE) ×4 IMPLANT
BLADE SURG ROTATE 9660 (MISCELLANEOUS) IMPLANT
BNDG GAUZE ELAST 4 BULKY (GAUZE/BANDAGES/DRESSINGS) ×4 IMPLANT
CANISTER SUCTION 2500CC (MISCELLANEOUS) ×4 IMPLANT
CANNULA GUNDRY RCSP 15FR (MISCELLANEOUS) ×4 IMPLANT
CATH CPB KIT VANTRIGT (MISCELLANEOUS) ×4 IMPLANT
CATH ROBINSON RED A/P 18FR (CATHETERS) ×12 IMPLANT
CATH THORACIC 36FR RT ANG (CATHETERS) ×4 IMPLANT
CLIP FOGARTY SPRING 6M (CLIP) ×4 IMPLANT
CLIP RETRACTION 3.0MM CORONARY (MISCELLANEOUS) ×8 IMPLANT
CLIP TI WIDE RED SMALL 24 (CLIP) ×3 IMPLANT
COVER SURGICAL LIGHT HANDLE (MISCELLANEOUS) ×4 IMPLANT
CRADLE DONUT ADULT HEAD (MISCELLANEOUS) ×4 IMPLANT
DRAIN CHANNEL 32F RND 10.7 FF (WOUND CARE) ×4 IMPLANT
DRAPE CARDIOVASCULAR INCISE (DRAPES) ×4
DRAPE SLUSH/WARMER DISC (DRAPES) ×4 IMPLANT
DRAPE SRG 135X102X78XABS (DRAPES) ×2 IMPLANT
DRSG AQUACEL AG ADV 3.5X14 (GAUZE/BANDAGES/DRESSINGS) ×4 IMPLANT
ELECT BLADE 4.0 EZ CLEAN MEGAD (MISCELLANEOUS) ×4
ELECT BLADE 6.5 EXT (BLADE) ×4 IMPLANT
ELECT CAUTERY BLADE 6.4 (BLADE) ×4 IMPLANT
ELECT REM PT RETURN 9FT ADLT (ELECTROSURGICAL) ×8
ELECTRODE BLDE 4.0 EZ CLN MEGD (MISCELLANEOUS) ×2 IMPLANT
ELECTRODE REM PT RTRN 9FT ADLT (ELECTROSURGICAL) ×4 IMPLANT
GAUZE SPONGE 4X4 12PLY STRL (GAUZE/BANDAGES/DRESSINGS) ×8 IMPLANT
GLOVE BIO SURGEON STRL SZ7.5 (GLOVE) ×12 IMPLANT
GOWN STRL REUS W/ TWL LRG LVL3 (GOWN DISPOSABLE) ×8 IMPLANT
GOWN STRL REUS W/TWL LRG LVL3 (GOWN DISPOSABLE) ×16
HEMOSTAT POWDER SURGIFOAM 1G (HEMOSTASIS) ×12 IMPLANT
HEMOSTAT SURGICEL 2X14 (HEMOSTASIS) ×4 IMPLANT
INSERT FOGARTY XLG (MISCELLANEOUS) IMPLANT
KIT BASIN OR (CUSTOM PROCEDURE TRAY) ×4 IMPLANT
KIT ROOM TURNOVER OR (KITS) ×4 IMPLANT
KIT SUCTION CATH 14FR (SUCTIONS) ×4 IMPLANT
KIT VASOVIEW W/TROCAR VH 2000 (KITS) ×4 IMPLANT
LEAD PACING MYOCARDI (MISCELLANEOUS) ×4 IMPLANT
MARKER GRAFT CORONARY BYPASS (MISCELLANEOUS) ×12 IMPLANT
NS IRRIG 1000ML POUR BTL (IV SOLUTION) ×20 IMPLANT
PACK OPEN HEART (CUSTOM PROCEDURE TRAY) ×4 IMPLANT
PAD ARMBOARD 7.5X6 YLW CONV (MISCELLANEOUS) ×8 IMPLANT
PAD ELECT DEFIB RADIOL ZOLL (MISCELLANEOUS) ×4 IMPLANT
PENCIL BUTTON HOLSTER BLD 10FT (ELECTRODE) ×4 IMPLANT
PUNCH AORTIC ROTATE  4.5MM 8IN (MISCELLANEOUS) ×4 IMPLANT
PUNCH AORTIC ROTATE 4.0MM (MISCELLANEOUS) IMPLANT
PUNCH AORTIC ROTATE 4.5MM 8IN (MISCELLANEOUS) IMPLANT
PUNCH AORTIC ROTATE 5MM 8IN (MISCELLANEOUS) IMPLANT
SET CARDIOPLEGIA MPS 5001102 (MISCELLANEOUS) ×4 IMPLANT
SPONGE LAP 4X18 X RAY DECT (DISPOSABLE) ×3 IMPLANT
STOPCOCK 4 WAY LG BORE MALE ST (IV SETS) ×4 IMPLANT
SURGIFLO W/THROMBIN 8M KIT (HEMOSTASIS) ×4 IMPLANT
SUT BONE WAX W31G (SUTURE) ×4 IMPLANT
SUT MNCRL AB 4-0 PS2 18 (SUTURE) IMPLANT
SUT PROLENE 3 0 SH DA (SUTURE) IMPLANT
SUT PROLENE 3 0 SH1 36 (SUTURE) IMPLANT
SUT PROLENE 4 0 RB 1 (SUTURE) ×8
SUT PROLENE 4 0 SH DA (SUTURE) ×4 IMPLANT
SUT PROLENE 4-0 RB1 .5 CRCL 36 (SUTURE) ×4 IMPLANT
SUT PROLENE 5 0 C 1 36 (SUTURE) IMPLANT
SUT PROLENE 6 0 C 1 30 (SUTURE) IMPLANT
SUT PROLENE 6 0 CC (SUTURE) ×20 IMPLANT
SUT PROLENE 8 0 BV175 6 (SUTURE) ×6 IMPLANT
SUT PROLENE BLUE 7 0 (SUTURE) ×8 IMPLANT
SUT SILK  1 MH (SUTURE)
SUT SILK 1 MH (SUTURE) IMPLANT
SUT SILK 2 0 SH CR/8 (SUTURE) ×8 IMPLANT
SUT SILK 3 0 SH CR/8 (SUTURE) ×4 IMPLANT
SUT STEEL 6MS V (SUTURE) ×8 IMPLANT
SUT STEEL SZ 6 DBL 3X14 BALL (SUTURE) ×4 IMPLANT
SUT VIC AB 1 CTX 36 (SUTURE) ×12
SUT VIC AB 1 CTX36XBRD ANBCTR (SUTURE) ×6 IMPLANT
SUT VIC AB 2-0 CT1 27 (SUTURE) ×4
SUT VIC AB 2-0 CT1 TAPERPNT 27 (SUTURE) ×2 IMPLANT
SUT VIC AB 2-0 CTX 27 (SUTURE) IMPLANT
SUT VIC AB 3-0 X1 27 (SUTURE) ×3 IMPLANT
SUTURE E-PAK OPEN HEART (SUTURE) ×4 IMPLANT
SYSTEM SAHARA CHEST DRAIN ATS (WOUND CARE) ×4 IMPLANT
TAPE CLOTH SURG 4X10 WHT LF (GAUZE/BANDAGES/DRESSINGS) ×3 IMPLANT
TOWEL OR 17X24 6PK STRL BLUE (TOWEL DISPOSABLE) ×8 IMPLANT
TOWEL OR 17X26 10 PK STRL BLUE (TOWEL DISPOSABLE) ×8 IMPLANT
TRAY CATH LUMEN 1 20CM STRL (SET/KITS/TRAYS/PACK) ×4 IMPLANT
TRAY FOLEY IC TEMP SENS 16FR (CATHETERS) ×4 IMPLANT
TUBING ART PRESS 48 MALE/FEM (TUBING) ×8 IMPLANT
TUBING INSUFFLATION (TUBING) ×4 IMPLANT
UNDERPAD 30X30 INCONTINENT (UNDERPADS AND DIAPERS) ×4 IMPLANT
WATER STERILE IRR 1000ML POUR (IV SOLUTION) ×8 IMPLANT

## 2014-11-03 NOTE — Progress Notes (Signed)
Patient ID: [name], male   DOB: 1935-02-13, 80 y.o.   MRN: [id number] EVENING ROUNDS NOTE :     [address]       [place] [address]             [phone]                 Day of Surgery Procedure(s) (LRB): CORONARY ARTERY BYPASS GRAFTING (CABG), ON PUMP, TIMES FOUR, USING LEFT INTERNAL MAMMARY, RIGHT GREATER SAPHENOUS VEIN HARVESTED ENDOSCOPICALLY (N/A) TRANSESOPHAGEAL ECHOCARDIOGRAM (TEE) (N/A)  Total Length of Stay:  LOS: 3 days  BP 96/59 mmHg  Pulse 72  Temp(Src) 97.9 F (36.6 C) (Core (Comment))  Resp 18  Ht 5\' 7"  (1.702 m)  Wt 194 lb 0.1 oz (88 kg)  BMI 30.38 kg/m2  SpO2 100%  .Intake/Output      04/25 0701 - 04/26 0700   P.O.    I.V. (mL/kg) 3898.7 (44.3)   Blood 625   NG/GT 30   IV Piggyback 1430   Total Intake(mL/kg) 5983.7 (68)   Urine (mL/kg/hr) 2035 (1.8)   Emesis/NG output 100 (0.1)   Blood 1575 (1.4)   Chest Tube 110 (0.1)   Total Output 3820   Net +2163.7         . sodium chloride 10 mL/hr at 11/03/14 1530  . [START ON 11/04/2014] sodium chloride    . sodium chloride    . dexmedetomidine Stopped (11/03/14 1814)  . insulin (NOVOLIN-R) infusion 2.4 Units/hr (11/03/14 1843)  . lactated ringers 10 mL/hr at 11/03/14 1515  . lactated ringers 20 mL/hr at 11/03/14 1515  . nitroGLYCERIN 10 mcg/min (11/03/14 1630)  . norepinephrine (LEVOPHED) Adult infusion Stopped (11/03/14 1414)  . phenylephrine (NEO-SYNEPHRINE) Adult infusion       Lab Results  Component Value Date   WBC 11.0* 11/03/2014   HGB 12.7* 11/03/2014   HCT 36.9* 11/03/2014   PLT 116* 11/03/2014   GLUCOSE 140* 11/03/2014   CHOL 169 02/28/2014   TRIG 184.0* 02/28/2014   HDL 36.80* 02/28/2014   LDLCALC 95 02/28/2014   ALT 15 01/19/2014   AST 15 01/19/2014   NA 142 11/03/2014   K 3.5 11/03/2014   CL 103 11/03/2014   CREATININE 0.70 11/03/2014   BUN 9 11/03/2014   CO2 22 11/03/2014   TSH 0.827 01/19/2014   PSA 0.99 11/28/2006   INR 1.47 11/03/2014   HGBA1C 6.9* 11/01/2014   Remains sedated on vent, not bleeding Craig Isaac MD      [address] [place] [address] Office [phone]   [place]

## 2014-11-03 NOTE — OR Nursing (Signed)
First call to SICU charge nurse at 1312.

## 2014-11-03 NOTE — Progress Notes (Signed)
  Echocardiogram Echocardiogram Transesophageal has been performed.  [name] 11/03/2014, 8:23 AM

## 2014-11-03 NOTE — Progress Notes (Signed)
The patient was examined and preop studies reviewed. There has been no change from the prior exam and the patient is ready for surgery.  plan CABG on J Rieth for L main stenosis

## 2014-11-03 NOTE — Progress Notes (Signed)
Weaning parameters: NIF -20, VC .8L

## 2014-11-03 NOTE — Anesthesia Preprocedure Evaluation (Addendum)
Anesthesia Evaluation  Patient identified by MRN, date of birth, ID band Patient awake    Reviewed: Allergy & Precautions, NPO status , Patient's Chart, lab work & pertinent test results  Airway Mallampati: II  TM Distance: >3 FB Neck ROM: Full    Dental  (+) Dental Advisory Given, Edentulous Upper, Partial Lower   Pulmonary former smoker,  breath sounds clear to auscultation        Cardiovascular hypertension, Pt. on medications and Pt. on home beta blockers + angina + CAD and + Cardiac Stents Rhythm:Regular Rate:Normal     Neuro/Psych    GI/Hepatic GERD-  Medicated,  Endo/Other  diabetes  Renal/GU      Musculoskeletal  (+) Arthritis -,   Abdominal   Peds  Hematology   Anesthesia Other Findings   Reproductive/Obstetrics                            Anesthesia Physical Anesthesia Plan  ASA: IV  Anesthesia Plan: General   Post-op Pain Management:    Induction: Intravenous  Airway Management Planned: Oral ETT  Additional Equipment: Arterial line, CVP, PA Cath, 3D TEE and Ultrasound Guidance Line Placement  Intra-op Plan:   Post-operative Plan: Post-operative intubation/ventilation  Informed Consent: I have reviewed the patients History and Physical, chart, labs and discussed the procedure including the risks, benefits and alternatives for the proposed anesthesia with the patient or authorized representative who has indicated his/her understanding and acceptance.   Dental advisory given  Plan Discussed with: CRNA, Anesthesiologist and Surgeon  Anesthesia Plan Comments:         Anesthesia Quick Evaluation

## 2014-11-03 NOTE — Anesthesia Procedure Notes (Signed)
Procedure Name: Intubation Date/Time: 11/03/2014 7:45 AM Performed by: Garrison Columbus T Pre-anesthesia Checklist: Patient identified, Emergency Drugs available, Suction available and Patient being monitored Patient Re-evaluated:Patient Re-evaluated prior to inductionOxygen Delivery Method: Circle system utilized Preoxygenation: Pre-oxygenation with 100% oxygen Intubation Type: IV induction Ventilation: Mask ventilation without difficulty Laryngoscope Size: Miller and 2 Grade View: Grade I Tube type: Oral Tube size: 8.5 mm Number of attempts: 1 Airway Equipment and Method: Stylet Placement Confirmation: ETT inserted through vocal cords under direct vision,  positive ETCO2 and breath sounds checked- equal and bilateral Secured at: 24 cm Tube secured with: Tape Dental Injury: Teeth and Oropharynx as per pre-operative assessment

## 2014-11-03 NOTE — Procedures (Signed)
Extubation Procedure Note  Patient Details:   Name: [name] DOB: October 01, 1934 MRN: [id number]   Airway Documentation:  Airway 8.5 mm (Active)  Secured at (cm) 22 cm 11/03/2014  7:23 PM  Measured From Lips 11/03/2014  7:23 PM  Secured Location Right 11/03/2014  7:23 PM  Secured By Pink Tape 11/03/2014  7:23 PM  Site Condition Dry 11/03/2014  7:23 PM    Evaluation  O2 sats: stable throughout Complications: No apparent complications Patient did tolerate procedure well. Bilateral Breath Sounds: Clear, Diminished   Yes   Patient extubated to 4L nasal cannula. Pt is tolerating well at this time with no apparent complications.   Maud Deed M 11/03/2014, 11:19 PM

## 2014-11-03 NOTE — Transfer of Care (Signed)
Immediate Anesthesia Transfer of Care Note  Patient: [name]  Procedure(s) Performed: Procedure(s) with comments: CORONARY ARTERY BYPASS GRAFTING (CABG), ON PUMP, TIMES FOUR, USING LEFT INTERNAL MAMMARY, RIGHT GREATER SAPHENOUS VEIN HARVESTED ENDOSCOPICALLY (N/A) - LIMA-LAD; SVG-DIAG; SVG-OM; SVG-RCA TRANSESOPHAGEAL ECHOCARDIOGRAM (TEE) (N/A)  Patient Location: SICU  Anesthesia Type:General  Level of Consciousness: sedated, unresponsive and Patient remains intubated per anesthesia plan  Airway & Oxygen Therapy: Patient remains intubated per anesthesia plan and Patient placed on Ventilator (see vital sign flow sheet for setting)  Post-op Assessment: Report given to RN and Post -op Vital signs reviewed and stable  Post vital signs: Reviewed and stable  Last Vitals:  Filed Vitals:   11/03/14 0600  BP: 144/82  Pulse: 76  Temp:   Resp:     Complications: No apparent anesthesia complications

## 2014-11-03 NOTE — Brief Op Note (Signed)
10/31/2014 - 11/03/2014      [address]       [place] [address]             [phone]     10/31/2014 - 11/03/2014  12:26 PM  PATIENT:  [name]  80 y.o. male  PRE-OPERATIVE DIAGNOSIS:  CAD  POST-OPERATIVE DIAGNOSIS:  CAD  PROCEDURE:  Procedure(s): CORONARY ARTERY BYPASS GRAFTING (CABG), ON PUMP, TIMES FOUR, USING LEFT INTERNAL MAMMARY, RIGHT GREATER SAPHENOUS VEIN HARVESTED ENDOSCOPICALLY (LIMA-LAD; SVG-DIAG; SVG-OM; SVG-RCA) TRANSESOPHAGEAL ECHOCARDIOGRAM (TEE)  SURGEON:  Surgeon(s): Ivin Poot, MD  PHYSICIAN ASSISTANT: Rhodie Cienfuegos PA-C  ANESTHESIA:   general  PATIENT CONDITION:  ICU - intubated and hemodynamically stable.  PRE-OPERATIVE WEIGHT: 03PR  COMPLICATIONS: NO KNOWN  EBL: SEE ANEST/PERFUSION RECORD

## 2014-11-03 NOTE — OR Nursing (Signed)
Second call to SICU charge nurse at 1427.

## 2014-11-04 ENCOUNTER — Encounter (HOSPITAL_COMMUNITY): Payer: Self-pay | Admitting: Cardiothoracic Surgery

## 2014-11-04 ENCOUNTER — Inpatient Hospital Stay (HOSPITAL_COMMUNITY): Payer: Medicare HMO

## 2014-11-04 LAB — CBC
HCT: 32.6 % — ABNORMAL LOW (ref 39.0–52.0)
HEMATOCRIT: 31.8 % — AB (ref 39.0–52.0)
HEMOGLOBIN: 10.7 g/dL — AB (ref 13.0–17.0)
Hemoglobin: 11.1 g/dL — ABNORMAL LOW (ref 13.0–17.0)
MCH: 29.5 pg (ref 26.0–34.0)
MCH: 29.2 pg (ref 26.0–34.0)
MCHC: 34 g/dL (ref 30.0–36.0)
MCHC: 33.6 g/dL (ref 30.0–36.0)
MCV: 86.7 fL (ref 78.0–100.0)
MCV: 86.6 fL (ref 78.0–100.0)
PLATELETS: 123 10*3/uL — AB (ref 150–400)
Platelets: 124 10*3/uL — ABNORMAL LOW (ref 150–400)
RBC: 3.76 MIL/uL — AB (ref 4.22–5.81)
RBC: 3.67 MIL/uL — ABNORMAL LOW (ref 4.22–5.81)
RDW: 13.6 % (ref 11.5–15.5)
RDW: 13.8 % (ref 11.5–15.5)
WBC: 15 10*3/uL — ABNORMAL HIGH (ref 4.0–10.5)
WBC: 13.7 10*3/uL — AB (ref 4.0–10.5)

## 2014-11-04 LAB — GLUCOSE, CAPILLARY
GLUCOSE-CAPILLARY: 118 mg/dL — AB (ref 70–99)
GLUCOSE-CAPILLARY: 183 mg/dL — AB (ref 70–99)
GLUCOSE-CAPILLARY: 151 mg/dL — AB (ref 70–99)
GLUCOSE-CAPILLARY: 143 mg/dL — AB (ref 70–99)
GLUCOSE-CAPILLARY: 107 mg/dL — AB (ref 70–99)
GLUCOSE-CAPILLARY: 98 mg/dL (ref 70–99)
GLUCOSE-CAPILLARY: 146 mg/dL — AB (ref 70–99)
Glucose-Capillary: 109 mg/dL — ABNORMAL HIGH (ref 70–99)
Glucose-Capillary: 112 mg/dL — ABNORMAL HIGH (ref 70–99)
Glucose-Capillary: 113 mg/dL — ABNORMAL HIGH (ref 70–99)
Glucose-Capillary: 114 mg/dL — ABNORMAL HIGH (ref 70–99)
Glucose-Capillary: 115 mg/dL — ABNORMAL HIGH (ref 70–99)
Glucose-Capillary: 116 mg/dL — ABNORMAL HIGH (ref 70–99)
Glucose-Capillary: 119 mg/dL — ABNORMAL HIGH (ref 70–99)
Glucose-Capillary: 119 mg/dL — ABNORMAL HIGH (ref 70–99)
Glucose-Capillary: 120 mg/dL — ABNORMAL HIGH (ref 70–99)
Glucose-Capillary: 121 mg/dL — ABNORMAL HIGH (ref 70–99)
Glucose-Capillary: 128 mg/dL — ABNORMAL HIGH (ref 70–99)
Glucose-Capillary: 133 mg/dL — ABNORMAL HIGH (ref 70–99)
Glucose-Capillary: 149 mg/dL — ABNORMAL HIGH (ref 70–99)
Glucose-Capillary: 174 mg/dL — ABNORMAL HIGH (ref 70–99)
Glucose-Capillary: 175 mg/dL — ABNORMAL HIGH (ref 70–99)
Glucose-Capillary: 186 mg/dL — ABNORMAL HIGH (ref 70–99)

## 2014-11-04 LAB — POCT I-STAT, CHEM 8
BUN: 13 mg/dL (ref 6–23)
CALCIUM ION: 1.18 mmol/L (ref 1.13–1.30)
CREATININE: 0.8 mg/dL (ref 0.50–1.35)
Chloride: 100 mmol/L (ref 96–112)
GLUCOSE: 195 mg/dL — AB (ref 70–99)
HEMATOCRIT: 32 % — AB (ref 39.0–52.0)
HEMOGLOBIN: 10.9 g/dL — AB (ref 13.0–17.0)
Potassium: 3.8 mmol/L (ref 3.5–5.1)
SODIUM: 137 mmol/L (ref 135–145)
TCO2: 21 mmol/L (ref 0–100)

## 2014-11-04 LAB — BASIC METABOLIC PANEL
Anion gap: 8 (ref 5–15)
BUN: 12 mg/dL (ref 6–23)
CALCIUM: 8.1 mg/dL — AB (ref 8.4–10.5)
CO2: 23 mmol/L (ref 19–32)
CREATININE: 0.78 mg/dL (ref 0.50–1.35)
Chloride: 109 mmol/L (ref 96–112)
GFR calc Af Amer: >90 mL/min (ref >90)
GFR, EST NON AFRICAN AMERICAN: 85 mL/min — AB (ref >90)
GLUCOSE: 112 mg/dL — AB (ref 70–99)
Potassium: 3.8 mmol/L (ref 3.5–5.1)
Sodium: 140 mmol/L (ref 135–145)

## 2014-11-04 LAB — CREATININE, SERUM
CREATININE: 0.92 mg/dL (ref 0.50–1.35)
GFR calc Af Amer: >90 mL/min (ref >90)
GFR calc non Af Amer: 79 mL/min — ABNORMAL LOW (ref >90)

## 2014-11-04 LAB — POCT I-STAT 3, ART BLOOD GAS (G3+)
ACID-BASE DEFICIT: 4 mmol/L — AB (ref 0.0–2.0)
Acid-base deficit: 4 mmol/L — ABNORMAL HIGH (ref 0.0–2.0)
Bicarbonate: 20.4 mEq/L (ref 20.0–24.0)
Bicarbonate: 20.9 mEq/L (ref 20.0–24.0)
O2 SAT: 97 %
O2 Saturation: 97 %
PH ART: 7.359 (ref 7.350–7.450)
PO2 ART: 100 mmHg (ref 80.0–100.0)
Patient temperature: 37.5
TCO2: 21 mmol/L (ref 0–100)
TCO2: 22 mmol/L (ref 0–100)
pCO2 arterial: 35.4 mmHg (ref 35.0–45.0)
pCO2 arterial: 37.2 mmHg (ref 35.0–45.0)
pH, Arterial: 7.371 (ref 7.350–7.450)
pO2, Arterial: 97 mmHg (ref 80.0–100.0)

## 2014-11-04 LAB — MAGNESIUM
Magnesium: 2.2 mg/dL (ref 1.5–2.5)
Magnesium: 2.2 mg/dL (ref 1.5–2.5)

## 2014-11-04 MED ORDER — CLOPIDOGREL BISULFATE 75 MG PO TABS
75.0000 mg | ORAL_TABLET | Freq: Every day | ORAL | Status: DC
  Administered 2014-11-05 – 2014-11-09 (×5): 75 mg via ORAL
  Filled 2014-11-04 (×5): qty 1

## 2014-11-04 MED ORDER — ASPIRIN EC 81 MG PO TBEC
81.0000 mg | DELAYED_RELEASE_TABLET | Freq: Every day | ORAL | Status: DC
  Administered 2014-11-05 – 2014-11-09 (×5): 81 mg via ORAL
  Filled 2014-11-04 (×5): qty 1

## 2014-11-04 MED ORDER — INSULIN ASPART 100 UNIT/ML ~~LOC~~ SOLN
0.0000 [IU] | SUBCUTANEOUS | Status: DC
  Administered 2014-11-04: 2 [IU] via SUBCUTANEOUS
  Administered 2014-11-04 (×2): 4 [IU] via SUBCUTANEOUS
  Administered 2014-11-05: 2 [IU] via SUBCUTANEOUS
  Administered 2014-11-05: 4 [IU] via SUBCUTANEOUS

## 2014-11-04 MED ORDER — SODIUM CHLORIDE 0.9 % IV SOLN
INTRAVENOUS | Status: DC
  Administered 2014-11-04: 10 mL/h via INTRAVENOUS

## 2014-11-04 MED ORDER — CETYLPYRIDINIUM CHLORIDE 0.05 % MT LIQD
7.0000 mL | Freq: Two times a day (BID) | OROMUCOSAL | Status: DC
  Administered 2014-11-04 – 2014-11-09 (×9): 7 mL via OROMUCOSAL

## 2014-11-04 MED ORDER — INSULIN DETEMIR 100 UNIT/ML ~~LOC~~ SOLN
6.0000 [IU] | Freq: Two times a day (BID) | SUBCUTANEOUS | Status: DC
  Administered 2014-11-04 – 2014-11-06 (×6): 6 [IU] via SUBCUTANEOUS
  Filled 2014-11-04 (×8): qty 0.06

## 2014-11-04 MED FILL — Potassium Chloride Inj 2 mEq/ML: INTRAVENOUS | Qty: 40 | Status: AC

## 2014-11-04 MED FILL — Magnesium Sulfate Inj 50%: INTRAMUSCULAR | Qty: 10 | Status: AC

## 2014-11-04 MED FILL — Heparin Sodium (Porcine) Inj 1000 Unit/ML: INTRAMUSCULAR | Qty: 30 | Status: AC

## 2014-11-04 NOTE — Progress Notes (Signed)
1 Day Post-Op Procedure(s) (LRB): CORONARY ARTERY BYPASS GRAFTING (CABG), ON PUMP, TIMES FOUR, USING LEFT INTERNAL MAMMARY, RIGHT GREATER SAPHENOUS VEIN HARVESTED ENDOSCOPICALLY (N/A) TRANSESOPHAGEAL ECHOCARDIOGRAM (TEE) (N/A) Subjective: CABGx4 for [place] Hemodynamics stable Objective: Vital signs in last 24 hours: Temp:  [97.7 F (36.5 C)-100 F (37.8 C)] 99.1 F (37.3 C) (04/26 1556) Pulse Rate:  [35-96] 89 (04/26 1700) Cardiac Rhythm:  [-] Normal sinus rhythm (04/26 1600) Resp:  [14-24] 24 (04/26 1700) BP: (93-146)/(52-90) 115/52 mmHg (04/26 1700) SpO2:  [92 %-100 %] 92 % (04/26 1700) Arterial Line BP: (77-141)/(46-75) 139/54 mmHg (04/26 1600) FiO2 (%):  [40 %-50 %] 40 % (04/25 2236) Weight:  [198 lb 6.6 oz (90 kg)] 198 lb 6.6 oz (90 kg) (04/26 0500)  Hemodynamic parameters for last 24 hours: PAP: (15-39)/(5-20) 39/20 mmHg CO:  [3 L/min-6.6 L/min] 6.6 L/min CI:  [1.5 L/min/m2-3.3 L/min/m2] 3.3 L/min/m2  Intake/Output from previous day: 04/25 0701 - 04/26 0700 In: 6774.7 [I.V.:4439.7; Blood:625; NG/GT:60; IV Piggyback:1650] Out: 3254 [Urine:2670; Emesis/NG output:150; Blood:1575; Chest Tube:320] Intake/Output this shift: Total I/O In: 693.9 [P.O.:500; I.V.:143.9; IV Piggyback:50] Out: 695 [Urine:525; Chest Tube:170]  Neuro intact nsr Lab Results:  Recent Labs  11/04/14 0415 11/04/14 1626 11/04/14 1645  WBC 15.0*  --  13.7*  HGB 11.1* 10.9* 10.7*  HCT 32.6* 32.0* 31.8*  PLT 123*  --  124*   BMET:  Recent Labs  11/03/14 2110 11/04/14 0415 11/04/14 1626  NA 138 140 137  K 4.4 3.8 3.8  CL 108 109 100  CO2 23 23  --   GLUCOSE 172* 112* 195*  BUN 11 12 13   CREATININE 0.84 0.78 0.80  CALCIUM 8.2* 8.1*  --     PT/INR:  Recent Labs  11/03/14 1522  LABPROT 17.9*  INR 1.47   ABG    Component Value Date/Time   PHART 7.371 11/04/2014 0013   HCO3 20.4 11/04/2014 0013   TCO2 21 11/04/2014 1626   ACIDBASEDEF 4.0* 11/04/2014 0013   O2SAT 97.0  11/04/2014 0013   CBG (last 3)   Recent Labs  11/04/14 1205 11/04/14 1437 11/04/14 1555  GLUCAP 120* 174* 186*    Assessment/Plan: S/P Procedure(s) (LRB): CORONARY ARTERY BYPASS GRAFTING (CABG), ON PUMP, TIMES FOUR, USING LEFT INTERNAL MAMMARY, RIGHT GREATER SAPHENOUS VEIN HARVESTED ENDOSCOPICALLY (N/A) TRANSESOPHAGEAL ECHOCARDIOGRAM (TEE) (N/A) Mobilize Diuresis Diabetes control d/c tubes/lines See progression orders start plavix POD#2 for  Acute coronary syndrome  LOS: 4 days    [name] 11/04/2014

## 2014-11-04 NOTE — Progress Notes (Signed)
T[facility] BRIEF SICU PROGRESS NOTE  1 Day Post-Op  S/P Procedure(s) (LRB): CORONARY ARTERY BYPASS GRAFTING (CABG), ON PUMP, TIMES FOUR, USING LEFT INTERNAL MAMMARY, RIGHT GREATER SAPHENOUS VEIN HARVESTED ENDOSCOPICALLY (N/A) TRANSESOPHAGEAL ECHOCARDIOGRAM (TEE) (N/A)   Stable day NSR w/ stable BP O2 sats 92-96% UOP adequate  Plan: Continue current plan  Rexene Alberts 11/04/2014 7:56 PM

## 2014-11-04 NOTE — Op Note (Signed)
NAMEJAXEN, SAMPLES NO.:  [id number]  MEDICAL RECORD NO.:  [id number]  LOCATION:  2S12C                        FACILITY:  [facility]  PHYSICIAN:  Ivin Poot, M.D.  DATE OF BIRTH:  16-Feb-1935  DATE OF PROCEDURE:  11/03/2014 DATE OF DISCHARGE:                              OPERATIVE REPORT   OPERATION: 1. Coronary artery bypass grafting x4 (left internal mammary artery to     left anterior descending, saphenous vein graft to diagonal,     saphenous vein graft to posterior descending, saphenous vein graft     to obtuse marginal). 2. Endoscopic harvest of right leg greater saphenous vein.  SURGEON:  Ivin Poot, M.D.  ASSISTANT:  John Giovanni, PA-C.  ANESTHESIA:  General by Finis Bud, M.D.  PREOPERATIVE DIAGNOSIS:  Unstable angina with severe 95% left main stenosis.  POSTOPERATIVE DIAGNOSIS:  Unstable angina with severe 95% left main stenosis.  CLINICAL NOTE:  The patient is a 80 year old Caucasian male who presented for cardiac catheterization to evaluate symptoms of unstable angina.  He was found to have a very tight left main stenosis and was admitted and placed on IV heparin.  Thoracic Surgical evaluation was requested for multivessel CABG.  Subsequent echocardiogram showed good LV function without valvular abnormality.  The patient was felt to be an appropriate candidate for multivessel CBG.  I discussed the procedure of CABG in detail with the patient and his family including the indications, benefits, alternatives, and risks of bleeding, MI, stroke, ventilator dependence, postoperative pulmonary problems including pleural effusion, infection, and death.  After reviewing these issues, he demonstrated his understanding and agreed to proceed with surgery under what I felt was an informed consent.  OPERATIVE FINDINGS: 1. Severely diseased coronaries with poor targets in the LAD and     diagonal distribution. 2. Protamine after separation  from cardiopulmonary bypass requiring     steroids and pressors with development of a generalized body rash. 3. No blood products required for this operation.  OPERATIVE PROCEDURE:  The patient was brought to the operating room and placed supine on the operating table where general anesthesia was induced under invasive hemodynamic monitoring.  The chest, abdomen, and legs were prepped with Betadine and draped as a sterile field.  A sternal incision was made as the saphenous vein was harvested endoscopically from the right leg.  The left internal mammary artery was harvested as a pedicle graft from its origin at the subclavian vessels. This was somewhat tedious because of dense intrapleural adhesions which obliterated left pleural space.  The pericardium was suspended and pursestrings were placed in the ascending aorta and right atrium.  Heparin was administered and ACT was documented as being therapeutic.  The patient was cannulated and placed on cardiopulmonary bypass.  The coronaries were identified for grafting and the mammary artery and vein grafts were prepared for the distal anastomoses.  Cardioplegia cannulas were placed both antegrade and retrograde cold blood cardioplegia.  The patient was cooled to 32 degrees.  The aortic crossclamp was applied and 1 L of cold blood cardioplegia was delivered in split doses between the catheters.  The distal coronary anastomoses were performed.  The  first distal anastomosis was to the posterior descending.  There was a proximal 50% long RCA stenosis.  A reverse saphenous vein was sewn end-to-side with running 7-0 Prolene to this 1.5-mm vessel with good flow.  Cardioplegia was redosed.  The second distal anastomosis was the OM branch of the left coronary. This had a proximal 95% left main stenosis.  A reverse saphenous vein was sewn end-to-side with running 7-0 Prolene to this 1.7-mm vessel with good flow.  Cardioplegia was redosed.  The  third distal anastomosis was to the diagonal branch of LAD.  This had a tight ostial stenosis of 90%.  A reverse saphenous vein was sewn end-to-side to this heavily diseased vessel.  Cardioplegia was delivered and there was good flow through the graft.  The fourth distal anastomosis was to the LAD.  The LAD was severely diseased, diffusely diseased, and a poor target for grafting.  The anastomosis was placed more distally.  The vessel wall was heavily calcified and thickened asymmetrically.  A 1-mm probe passed distally. The left IMA pedicle was brought through an opening in the left lateral pericardium, was brought down onto the LAD and sewn end-to-side with running 8-0 Prolene.  There was good flow through the anastomosis after briefly releasing the pedicle bulldog on the mammary artery.  The bulldog was reapplied and the pedicle was secured to the epicardium. Cardioplegia was redosed.  While the crossclamp was in place, 3 proximal vein anastomoses were performed on the ascending aorta which was somewhat thickened.  Before removing the crossclamp, air was vented from the coronaries with a dose of retrograde warm blood cardioplegia.  The crossclamp was removed.  The heart resumed a spontaneous rhythm. The vein grafts were de-aired and opened initiating flow and hemostasis was documented at the proximal and distal anastomoses.  The patient was rewarmed and reperfused.  Temporary pacing wires were applied.  The lungs re-expanded, the ventilator was resumed.  The patient was weaned off cardiopulmonary bypass without difficulty without inotropes.  Echo showed good LV global function.  Protamine was administered.  The patient remained stable for 5-8 minutes but then developed hypotension with persistent and excellent cardiac function and normal LV function by echo.  The patient was given volume steroids norepinephrine drip to increase FVR.  He developed body rash and was given Benadryl and  IV Pepcid.  Gradually, the patient's blood pressure stabilized.  I had placed a right femoral A-line to better monitor the blood pressure as there was some discrepancy between the radial and femoral A-line.  Echo persisted in showing good global LV function.  After the protamine reaction was treated, the mediastinum was irrigated and the superior pericardial fat was closed.  The anterior mediastinal and left pleural chest tubes were placed and brought out through separate incisions.  The sternum was closed with a wire.  The pectoralis fascia was closed with running #1 Vicryl.  The subcutaneous and skin layers were closed with a running Vicryl and sterile dressings were applied.  Total cardiopulmonary bypass time was 134 minutes.     Ivin Poot, M.D.     PV/MEDQ  D:  11/03/2014  T:  11/04/2014  Job:  [id number]  cc:   Juanda Bond. Burt Knack, MD

## 2014-11-05 ENCOUNTER — Inpatient Hospital Stay (HOSPITAL_COMMUNITY): Payer: Medicare HMO

## 2014-11-05 LAB — BASIC METABOLIC PANEL
Anion gap: 6 (ref 5–15)
BUN: 19 mg/dL (ref 6–23)
CO2: 25 mmol/L (ref 19–32)
Calcium: 7.7 mg/dL — ABNORMAL LOW (ref 8.4–10.5)
Chloride: 102 mmol/L (ref 96–112)
Creatinine, Ser: 0.84 mg/dL (ref 0.50–1.35)
GFR calc Af Amer: >90 mL/min (ref >90)
GFR calc non Af Amer: 82 mL/min — ABNORMAL LOW (ref >90)
Glucose, Bld: 148 mg/dL — ABNORMAL HIGH (ref 70–99)
Potassium: 3.9 mmol/L (ref 3.5–5.1)
Sodium: 133 mmol/L — ABNORMAL LOW (ref 135–145)

## 2014-11-05 LAB — CBC
HCT: 28.3 % — ABNORMAL LOW (ref 39.0–52.0)
Hemoglobin: 9.7 g/dL — ABNORMAL LOW (ref 13.0–17.0)
MCH: 30 pg (ref 26.0–34.0)
MCHC: 34.3 g/dL (ref 30.0–36.0)
MCV: 87.6 fL (ref 78.0–100.0)
Platelets: 137 10*3/uL — ABNORMAL LOW (ref 150–400)
RBC: 3.23 MIL/uL — ABNORMAL LOW (ref 4.22–5.81)
RDW: 14 % (ref 11.5–15.5)
WBC: 12.1 10*3/uL — ABNORMAL HIGH (ref 4.0–10.5)

## 2014-11-05 LAB — GLUCOSE, CAPILLARY
GLUCOSE-CAPILLARY: 162 mg/dL — AB (ref 70–99)
GLUCOSE-CAPILLARY: 145 mg/dL — AB (ref 70–99)
GLUCOSE-CAPILLARY: 119 mg/dL — AB (ref 70–99)
Glucose-Capillary: 136 mg/dL — ABNORMAL HIGH (ref 70–99)
Glucose-Capillary: 145 mg/dL — ABNORMAL HIGH (ref 70–99)
Glucose-Capillary: 156 mg/dL — ABNORMAL HIGH (ref 70–99)

## 2014-11-05 MED ORDER — AMIODARONE HCL IN DEXTROSE 360-4.14 MG/200ML-% IV SOLN
60.0000 mg/h | INTRAVENOUS | Status: AC
  Administered 2014-11-05 – 2014-11-06 (×2): 60 mg/h via INTRAVENOUS
  Filled 2014-11-05: qty 200

## 2014-11-05 MED ORDER — LEVOFLOXACIN IN D5W 750 MG/150ML IV SOLN
750.0000 mg | INTRAVENOUS | Status: AC
  Administered 2014-11-05 – 2014-11-06 (×2): 750 mg via INTRAVENOUS
  Filled 2014-11-05 (×2): qty 150

## 2014-11-05 MED ORDER — SIMETHICONE 80 MG PO CHEW
80.0000 mg | CHEWABLE_TABLET | Freq: Four times a day (QID) | ORAL | Status: DC
  Administered 2014-11-05 – 2014-11-09 (×11): 80 mg via ORAL
  Filled 2014-11-05 (×17): qty 1

## 2014-11-05 MED ORDER — AMIODARONE HCL IN DEXTROSE 360-4.14 MG/200ML-% IV SOLN
30.0000 mg/h | INTRAVENOUS | Status: DC
  Administered 2014-11-06: 30 mg/h via INTRAVENOUS
  Filled 2014-11-05 (×2): qty 200

## 2014-11-05 MED ORDER — LISINOPRIL 2.5 MG PO TABS
2.5000 mg | ORAL_TABLET | Freq: Every day | ORAL | Status: DC
  Administered 2014-11-05: 2.5 mg via ORAL
  Filled 2014-11-05: qty 1

## 2014-11-05 MED ORDER — FE FUMARATE-B12-VIT C-FA-IFC PO CAPS
1.0000 | ORAL_CAPSULE | Freq: Three times a day (TID) | ORAL | Status: DC
  Administered 2014-11-05 – 2014-11-09 (×12): 1 via ORAL
  Filled 2014-11-05 (×16): qty 1

## 2014-11-05 NOTE — Plan of Care (Signed)
Problem: Phase II - Intermediate Post-Op Goal: Wean to Extubate Outcome: Completed/Met Date Met:  11/05/14 4/26 Goal: Activity Progressed Outcome: Progressing Pt OOB to Chair

## 2014-11-05 NOTE — Progress Notes (Signed)
PT Cancellation Note  Patient Details Name: [name] MRN: [id number] DOB: [birth year]   Cancelled Treatment:    Reason Eval/Treat Not Completed: Patient at procedure or test/unavailable. 1st attempt, pt just back to bed with nursing. Nauseous and woozy from meds. 2nd attempt, in bathroom (up with nursing) >10 minutes. RN did report pt is unsteady and has h/o falls.   [name] 11/05/2014, 11:26 AM  Pager [phone]

## 2014-11-05 NOTE — Progress Notes (Addendum)
TCTS DAILY ICU PROGRESS NOTE                   [address]            [place] [address]          [phone]   2 Days Post-Op Procedure(s) (LRB): CORONARY ARTERY BYPASS GRAFTING (CABG), ON PUMP, TIMES FOUR, USING LEFT INTERNAL MAMMARY, RIGHT GREATER SAPHENOUS VEIN HARVESTED ENDOSCOPICALLY (N/A) TRANSESOPHAGEAL ECHOCARDIOGRAM (TEE) (N/A)  Total Length of Stay:  LOS: 5 days   Subjective: Feels ok, sore and a bit weak  Objective: Vital signs in last 24 hours: Temp:  [98.4 F (36.9 C)-99.6 F (37.6 C)] 98.4 F (36.9 C) (04/27 0750) Pulse Rate:  [85-103] 86 (04/27 0800) Cardiac Rhythm:  [-] Normal sinus rhythm (04/27 0800) Resp:  [15-27] 24 (04/27 0800) BP: (103-146)/(36-90) 127/58 mmHg (04/27 0800) SpO2:  [89 %-100 %] 96 % (04/27 0800) Arterial Line BP: (101-141)/(49-65) 139/54 mmHg (04/26 1600) Weight:  [199 lb 15.3 oz (90.7 kg)] 199 lb 15.3 oz (90.7 kg) (04/27 0600)  Filed Weights   10/31/14 1230 11/04/14 0500 11/05/14 0600  Weight: 194 lb 0.1 oz (88 kg) 198 lb 6.6 oz (90 kg) 199 lb 15.3 oz (90.7 kg)    Weight change: 1 lb 8.7 oz (0.7 kg)   Hemodynamic parameters for last 24 hours: PAP: (26-39)/(9-20) 39/20 mmHg  Intake/Output from previous day: 04/26 0701 - 04/27 0700 In: 1573.9 [P.O.:1040; I.V.:333.9; IV Piggyback:200] Out: 2030 [Urine:1800; Chest Tube:230]  Intake/Output this shift:    Current Meds: Scheduled Meds: . acetaminophen  1,000 mg Oral 4 times per day   Or  . acetaminophen (TYLENOL) oral liquid 160 mg/5 mL  1,000 mg Per Tube 4 times per day  . antiseptic oral rinse  7 mL Mouth Rinse BID  . aspirin EC  81 mg Oral Daily  . bisacodyl  10 mg Oral Daily   Or  . bisacodyl  10 mg Rectal Daily  . clopidogrel  75 mg Oral Daily  . docusate sodium  200 mg Oral Daily  . insulin aspart  0-24 Units Subcutaneous 6 times per day  . insulin detemir  6 Units Subcutaneous BID  . insulin regular  0-10 Units Intravenous TID WC  . levofloxacin  (LEVAQUIN) IV  750 mg Intravenous Q24H  . metoprolol tartrate  12.5 mg Oral BID   Or  . metoprolol tartrate  12.5 mg Per Tube BID  . mupirocin ointment  1 application Nasal BID  . pantoprazole  40 mg Oral Daily  . pravastatin  20 mg Oral QPM  . sodium chloride  3 mL Intravenous Q12H   Continuous Infusions: . sodium chloride 10 mL/hr at 11/05/14 0700  . phenylephrine (NEO-SYNEPHRINE) Adult infusion     PRN Meds:.metoprolol, morphine injection, ondansetron (ZOFRAN) IV, oxyCODONE, sodium chloride, traMADol  General appearance: alert, cooperative and no distress Heart: regular rate and rhythm Lungs: clear to auscultation bilaterally Abdomen: benign Extremities: minor edema Wound: incis healing well  Lab Results: CBC: Recent Labs  11/04/14 1645 11/05/14 0430  WBC 13.7* 12.1*  HGB 10.7* 9.7*  HCT 31.8* 28.3*  PLT 124* 137*   BMET:  Recent Labs  11/04/14 0415 11/04/14 1626 11/04/14 1645 11/05/14 0430  NA 140 137  --  133*  K 3.8 3.8  --  3.9  CL 109 100  --  102  CO2 23  --   --  25  GLUCOSE 112* 195*  --  148*  BUN 12 13  --  19  CREATININE 0.78 0.80 0.92 0.84  CALCIUM 8.1*  --   --  7.7*    PT/INR:  Recent Labs  11/03/14 1522  LABPROT 17.9*  INR 1.47   Radiology: Dg Chest Port 1 View  11/04/2014   CLINICAL DATA:  Status post coronary artery bypass graft.  EXAM: PORTABLE CHEST - 1 VIEW  COMPARISON:  11/03/2014, 11/01/2014, and chest CT 10/31/2014.  FINDINGS: Interval extubation and removal of gastric tube. Changes of median sternotomy for CABG. Right IJ approach Swan-Ganz catheter unchanged, with distal tip projecting over the expected location of the main pulmonary artery. Mediastinal drain and left chest tube present. Stable heart and mediastinal contours. Calcified thoracic aorta arch. Improved aeration at the lung bases, with improved lung volumes compared to yesterday's chest radiograph. Negative for pulmonary edema or visible pleural effusion. No focal  airspace opacities. Negative for pneumothorax. No acute osseous abnormality identified.  IMPRESSION: Improved volume and aeration of the lungs compared to chest radiograph of 11/03/2014. Remaining support devices appear stable.   Electronically Signed   By: Curlene Dolphin M.D.   On: 11/04/2014 08:11     Assessment/Plan: S/P Procedure(s) (LRB): CORONARY ARTERY BYPASS GRAFTING (CABG), ON PUMP, TIMES FOUR, USING LEFT INTERNAL MAMMARY, RIGHT GREATER SAPHENOUS VEIN HARVESTED ENDOSCOPICALLY (N/A) TRANSESOPHAGEAL ECHOCARDIOGRAM (TEE) (N/A)  1 doing well with progression 2 push rehab/pulm toilet as able 3 HCT down a bit further-monitor, thrombocytopenia is improving 3 mild hyponatremia- monitor, sugars a bit up so pseudo component 4 hemodyn stable in sinus rhythm- will add low dose ace inhib and see how he tolerates  GOLD,WAYNE E 11/05/2014 8:36 AM  Transfer to stepdown Start po iron for expected postop anemia patient examined and medical record reviewed,agree with above note. Tharon Aquas Trigt III 11/05/2014

## 2014-11-06 ENCOUNTER — Inpatient Hospital Stay (HOSPITAL_COMMUNITY): Payer: Medicare HMO

## 2014-11-06 LAB — CBC
HCT: 28 % — ABNORMAL LOW (ref 39.0–52.0)
Hemoglobin: 9.2 g/dL — ABNORMAL LOW (ref 13.0–17.0)
MCH: 28.7 pg (ref 26.0–34.0)
MCHC: 32.9 g/dL (ref 30.0–36.0)
MCV: 87.2 fL (ref 78.0–100.0)
Platelets: 144 10*3/uL — ABNORMAL LOW (ref 150–400)
RBC: 3.21 MIL/uL — ABNORMAL LOW (ref 4.22–5.81)
RDW: 14 % (ref 11.5–15.5)
WBC: 10.6 10*3/uL — ABNORMAL HIGH (ref 4.0–10.5)

## 2014-11-06 LAB — GLUCOSE, CAPILLARY
GLUCOSE-CAPILLARY: 134 mg/dL — AB (ref 70–99)
GLUCOSE-CAPILLARY: 162 mg/dL — AB (ref 70–99)
GLUCOSE-CAPILLARY: 127 mg/dL — AB (ref 70–99)
Glucose-Capillary: 147 mg/dL — ABNORMAL HIGH (ref 70–99)
Glucose-Capillary: 142 mg/dL — ABNORMAL HIGH (ref 70–99)

## 2014-11-06 LAB — BASIC METABOLIC PANEL
Anion gap: 8 (ref 5–15)
BUN: 13 mg/dL (ref 6–23)
CO2: 25 mmol/L (ref 19–32)
Calcium: 7.8 mg/dL — ABNORMAL LOW (ref 8.4–10.5)
Chloride: 103 mmol/L (ref 96–112)
Creatinine, Ser: 0.83 mg/dL (ref 0.50–1.35)
GFR calc Af Amer: >90 mL/min (ref >90)
GFR calc non Af Amer: 83 mL/min — ABNORMAL LOW (ref >90)
Glucose, Bld: 144 mg/dL — ABNORMAL HIGH (ref 70–99)
Potassium: 3.7 mmol/L (ref 3.5–5.1)
Sodium: 136 mmol/L (ref 135–145)

## 2014-11-06 LAB — TYPE AND SCREEN
ABO/RH(D): O POS
Antibody Screen: NEGATIVE
Unit division: 0
Unit division: 0

## 2014-11-06 MED ORDER — SODIUM CHLORIDE 0.9 % IJ SOLN
3.0000 mL | Freq: Two times a day (BID) | INTRAMUSCULAR | Status: DC
  Administered 2014-11-06 (×2): 3 mL via INTRAVENOUS

## 2014-11-06 MED ORDER — POTASSIUM CHLORIDE CRYS ER 20 MEQ PO TBCR: 20.0000 meq | EXTENDED_RELEASE_TABLET | ORAL | Status: DC | PRN

## 2014-11-06 MED ORDER — POTASSIUM CHLORIDE CRYS ER 20 MEQ PO TBCR
20.0000 meq | EXTENDED_RELEASE_TABLET | Freq: Every day | ORAL | Status: DC
  Administered 2014-11-06: 20 meq via ORAL
  Filled 2014-11-06 (×3): qty 1

## 2014-11-06 MED ORDER — METOCLOPRAMIDE HCL 5 MG/ML IJ SOLN
10.0000 mg | Freq: Three times a day (TID) | INTRAMUSCULAR | Status: DC
  Administered 2014-11-06: 10 mg via INTRAVENOUS
  Filled 2014-11-06: qty 2

## 2014-11-06 MED ORDER — LACTULOSE 10 GM/15ML PO SOLN
30.0000 g | Freq: Every day | ORAL | Status: DC | PRN
  Filled 2014-11-06: qty 45

## 2014-11-06 MED ORDER — INSULIN ASPART 100 UNIT/ML ~~LOC~~ SOLN
0.0000 [IU] | Freq: Three times a day (TID) | SUBCUTANEOUS | Status: DC
  Administered 2014-11-06: 4 [IU] via SUBCUTANEOUS
  Administered 2014-11-06 – 2014-11-07 (×3): 2 [IU] via SUBCUTANEOUS
  Administered 2014-11-07: 4 [IU] via SUBCUTANEOUS
  Administered 2014-11-08: 2 [IU] via SUBCUTANEOUS

## 2014-11-06 MED ORDER — MAGNESIUM HYDROXIDE 400 MG/5ML PO SUSP: 30.0000 mL | Freq: Every day | ORAL | Status: DC | PRN

## 2014-11-06 MED ORDER — FUROSEMIDE 40 MG PO TABS
40.0000 mg | ORAL_TABLET | Freq: Every day | ORAL | Status: DC
  Administered 2014-11-06 – 2014-11-09 (×4): 40 mg via ORAL
  Filled 2014-11-06 (×4): qty 1

## 2014-11-06 MED ORDER — SODIUM CHLORIDE 0.9 % IV SOLN: 250.0000 mL | INTRAVENOUS | Status: DC | PRN

## 2014-11-06 MED ORDER — SODIUM CHLORIDE 0.9 % IJ SOLN: 3.0000 mL | INTRAMUSCULAR | Status: DC | PRN

## 2014-11-06 MED ORDER — MOVING RIGHT ALONG BOOK
Freq: Once | Status: AC
  Administered 2014-11-06: 1
  Filled 2014-11-06: qty 1

## 2014-11-06 MED ORDER — ENSURE ENLIVE PO LIQD
237.0000 mL | Freq: Two times a day (BID) | ORAL | Status: DC
  Administered 2014-11-06 – 2014-11-09 (×6): 237 mL via ORAL

## 2014-11-06 MED ORDER — METOCLOPRAMIDE HCL 5 MG/ML IJ SOLN
10.0000 mg | Freq: Four times a day (QID) | INTRAMUSCULAR | Status: DC
  Administered 2014-11-06 – 2014-11-08 (×3): 10 mg via INTRAVENOUS
  Filled 2014-11-06 (×13): qty 2

## 2014-11-06 MED ORDER — LACTULOSE 10 GM/15ML PO SOLN
30.0000 g | Freq: Every day | ORAL | Status: AC
  Administered 2014-11-06: 30 g via ORAL
  Filled 2014-11-06 (×3): qty 45

## 2014-11-06 MED ORDER — POTASSIUM CHLORIDE 10 MEQ/50ML IV SOLN: 10.0000 meq | INTRAVENOUS | Status: AC

## 2014-11-06 MED ORDER — PROMETHAZINE HCL 25 MG/ML IJ SOLN: 12.5000 mg | Freq: Four times a day (QID) | INTRAMUSCULAR | Status: DC | PRN

## 2014-11-06 NOTE — Anesthesia Postprocedure Evaluation (Signed)
  Anesthesia Post-op Note  Patient: [name]  Procedure(s) Performed: Procedure(s) with comments: CORONARY ARTERY BYPASS GRAFTING (CABG), ON PUMP, TIMES FOUR, USING LEFT INTERNAL MAMMARY, RIGHT GREATER SAPHENOUS VEIN HARVESTED ENDOSCOPICALLY (N/A) - LIMA-LAD; SVG-DIAG; SVG-OM; SVG-RCA TRANSESOPHAGEAL ECHOCARDIOGRAM (TEE) (N/A)  Patient Location: PACU and SICU  Anesthesia Type:General  Level of Consciousness: sedated  Airway and Oxygen Therapy: Patient remains intubated per anesthesia plan  Post-op Pain: mild  Post-op Assessment: Post-op Vital signs reviewed  Post-op Vital Signs: Reviewed  Last Vitals:  Filed Vitals:   11/06/14 1500  BP:   Pulse:   Temp: 38.4 C  Resp:     Complications: No apparent anesthesia complications

## 2014-11-06 NOTE — Progress Notes (Signed)
[facility] DAILY ICU PROGRESS NOTE                   [address]            [place] [address]          [phone]   3 Days Post-Op Procedure(s) (LRB): CORONARY ARTERY BYPASS GRAFTING (CABG), ON PUMP, TIMES FOUR, USING LEFT INTERNAL MAMMARY, RIGHT GREATER SAPHENOUS VEIN HARVESTED ENDOSCOPICALLY (N/A) TRANSESOPHAGEAL ECHOCARDIOGRAM (TEE) (N/A)  Total Length of Stay:  LOS: 6 days   Subjective: + nausea, feels somewhat constipated. Has chronic back issues   Objective: Vital signs in last 24 hours: Temp:  [98 F (36.7 C)-99.6 F (37.6 C)] 98 F (36.7 C) (04/28 0355) Pulse Rate:  [29-96] 77 (04/28 0700) Cardiac Rhythm:  [-] Normal sinus rhythm (04/27 2000) Resp:  [14-28] 21 (04/28 0600) BP: (85-142)/(40-87) 110/49 mmHg (04/28 0700) SpO2:  [92 %-98 %] 97 % (04/28 0700) Weight:  [196 lb 3.4 oz (89 kg)] 196 lb 3.4 oz (89 kg) (04/28 0500)  Filed Weights   11/04/14 0500 11/05/14 0600 11/06/14 0500  Weight: 198 lb 6.6 oz (90 kg) 199 lb 15.3 oz (90.7 kg) 196 lb 3.4 oz (89 kg)    Weight change: -3 lb 12 oz (-1.7 kg)   Hemodynamic parameters for last 24 hours:    Intake/Output from previous day: 04/27 0701 - 04/28 0700 In: 904.1 [I.V.:604.1; IV Piggyback:300] Out: 1225 [Urine:1225]  Intake/Output this shift:    Current Meds: Scheduled Meds: . acetaminophen  1,000 mg Oral 4 times per day   Or  . acetaminophen (TYLENOL) oral liquid 160 mg/5 mL  1,000 mg Per Tube 4 times per day  . antiseptic oral rinse  7 mL Mouth Rinse BID  . aspirin EC  81 mg Oral Daily  . bisacodyl  10 mg Oral Daily   Or  . bisacodyl  10 mg Rectal Daily  . clopidogrel  75 mg Oral Daily  . docusate sodium  200 mg Oral Daily  . ferrous [id number] C-folic acid  1 capsule Oral TID PC  . furosemide  40 mg Oral Daily  . insulin aspart  0-24 Units Subcutaneous TID AC & HS  . insulin detemir  6 Units Subcutaneous BID  . levofloxacin (LEVAQUIN) IV  750 mg Intravenous Q24H  .  metoprolol tartrate  12.5 mg Oral BID   Or  . metoprolol tartrate  12.5 mg Per Tube BID  . pantoprazole  40 mg Oral Daily  . potassium chloride  20 mEq Oral Daily  . pravastatin  20 mg Oral QPM  . simethicone  80 mg Oral QID  . sodium chloride  3 mL Intravenous Q12H  . sodium chloride  3 mL Intravenous Q12H   Continuous Infusions: . sodium chloride Stopped (11/06/14 0600)  . amiodarone 30 mg/hr (11/06/14 0600)   PRN Meds:.sodium chloride, magnesium hydroxide, metoprolol, ondansetron (ZOFRAN) IV, oxyCODONE, potassium chloride, sodium chloride, sodium chloride, traMADol  General appearance: alert, cooperative and no distress Heart: regular rate and rhythm Lungs: fairly clear anteriorly Abdomen: + BS, soft, non-tender Extremities: minor edema Wound: ok  Lab Results: CBC: Recent Labs  11/05/14 0430 11/06/14 0603  WBC 12.1* 10.6*  HGB 9.7* 9.2*  HCT 28.3* 28.0*  PLT 137* 144*   BMET:  Recent Labs  11/05/14 0430 11/06/14 0603  NA 133* 136  K 3.9 3.7  CL 102 103  CO2 25 25  GLUCOSE 148* 144*  BUN 19 13  CREATININE  0.84 0.83  CALCIUM 7.7* 7.8*    PT/INR:  Recent Labs  11/03/14 1522  LABPROT 17.9*  INR 1.47   Radiology: Dg Chest Port 1 View  11/05/2014   CLINICAL DATA:  Shortness of breath. Recent coronary artery bypass grafting for coronary artery disease  EXAM: PORTABLE CHEST - 1 VIEW  COMPARISON:  November 04, 2014  FINDINGS: Left chest tube and mediastinal drain and been removed. Swan-Ganz catheter is been removed. Cordis tip is in the superior vena cava. No pneumothorax. There is atelectatic change in the left mid lung and base regions. Lungs elsewhere clear. Heart is mildly enlarged with pulmonary vascularity within normal limits. No adenopathy. There is a hiatal hernia present.  IMPRESSION: No pneumothorax. Lungs are clear except for mild atelectasis in the left mid lower lung zones. Stable cardiac prominence. Hiatal hernia present.   Electronically Signed   By:  Lowella Grip III M.D.   On: 11/05/2014 08:04   Dg Abd Portable 1v  11/05/2014   CLINICAL DATA:  80 year old male with 1 day history of abdominal pain  EXAM: PORTABLE ABDOMEN - 1 VIEW  COMPARISON:  Fixed prior lumbar spine radiographs 07/23/2012; prior CT abdomen/ pelvis 11/11/2005  FINDINGS: The bowel gas pattern is not obstructed. 7 and a 9 mm renal stones project over the lower pole of the right kidney. A 1.2 cm radiopacity projecting over the upper pole of the left kidney may represent additional renal stone although this was not present on prior imaging and may be within the overlying fecal stream. No acute osseous abnormality.  IMPRESSION: 1. Normal bowel gas pattern. 2. Right nephrolithiasis again noted. 3. Possible new left nephrolithiasis versus radiopacity within the overlying fecal stream.   Electronically Signed   By: Jacqulynn Cadet M.D.   On: 11/05/2014 21:35     Assessment/Plan: S/P Procedure(s) (LRB): CORONARY ARTERY BYPASS GRAFTING (CABG), ON PUMP, TIMES FOUR, USING LEFT INTERNAL MAMMARY, RIGHT GREATER SAPHENOUS VEIN HARVESTED ENDOSCOPICALLY (N/A) TRANSESOPHAGEAL ECHOCARDIOGRAM (TEE) (N/A)  1 will try reglan and Lactulose for nausea/constipation 2 amio was started for frequent PAC's- could be contrib to nausea 3 chronic back pain- PT/rehab as able 4 H/H stable 5 gentle diuresis    [name] 11/06/2014 8:20 AM

## 2014-11-06 NOTE — Evaluation (Signed)
Physical Therapy Evaluation Patient Details Name: [name] MRN: [id number] DOB: Jun 15, 1935 Today's Date: 11/06/2014   History of Present Illness  Adm 10/31/14 and underwent cardiac cath followed by CABG 4/26. PMHx- fall 01/2014 with SDH and evacuation of hematoma  Clinical Impression  Patient is s/p above surgery resulting in functional limitations due to the deficits listed below (see PT Problem List). Pt needs further education on sternal precautions and how this impacts his mobility. Patient will benefit from skilled PT to increase their independence and safety with mobility to allow discharge to the venue listed below.       Follow Up Recommendations Home health PT;Supervision for mobility/OOB    Equipment Recommendations  None recommended by PT    Recommendations for Other Services OT consult     Precautions / Restrictions Precautions Precautions: Fall Precaution Comments: denies any falls since 01/2014      Mobility  Bed Mobility Overal bed mobility: Needs Assistance Bed Mobility: Sit to Sidelying;Rolling Rolling: Min guard       Sit to sidelying: Min assist;+2 for physical assistance General bed mobility comments: vc for sit to sidelying and to adhere to sternal precautions  Transfers Overall transfer level: Needs assistance Equipment used: 1 person hand held assist Transfers: Sit to/from Stand Sit to Stand: Min assist;+2 safety/equipment            Ambulation/Gait Ambulation/Gait assistance: Min assist;+2 safety/equipment Ambulation Distance (Feet): 4 Feet Assistive device: 1 person hand held assist Gait Pattern/deviations: Step-through pattern;Decreased stride length     General Gait Details: Pt fatigued from OOB most of day; agreed to walk back to bed  Stairs            Wheelchair Mobility    Modified Rankin (Stroke Patients Only)       Balance Overall balance assessment: Needs assistance         Standing balance support:  Bilateral upper extremity supported Standing balance-Leahy Scale: Poor Standing balance comment: posterior bias with assist to prevent fall                             Pertinent Vitals/Pain Pain Assessment: 0-10 Pain Score: 2  Pain Location: chest Pain Intervention(s): Limited activity within patient's tolerance;Monitored during session;Repositioned    Home Living Family/patient expects to be discharged to:: Private residence Living Arrangements: Spouse/significant other Available Help at Discharge: Family Type of Home: House Home Access: Stairs to enter   Technical brewer of Steps: 1 Home Layout: Multi-level Home Equipment: [place] - single point;Walker - 2 wheels;Grab bars - tub/shower;Bedside commode      Prior Function Level of Independence: Independent with assistive device(s)         Comments: pt used cane for ambulation occasionally; furniture walking inside     Hand Dominance   Dominant Hand: Right    Extremity/Trunk Assessment   Upper Extremity Assessment: Overall WFL for tasks assessed           Lower Extremity Assessment: Generalized weakness      Cervical / Trunk Assessment: Normal  Communication   Communication: HOH  Cognition Arousal/Alertness: Awake/alert Behavior During Therapy: WFL for tasks assessed/performed Overall Cognitive Status: Within Functional Limits for tasks assessed                      General Comments      Exercises        Assessment/Plan    PT Assessment Patient  needs continued PT services  PT Diagnosis Difficulty walking;Acute pain   PT Problem List Decreased strength;Decreased activity tolerance;Decreased balance;Decreased mobility;Decreased knowledge of use of DME;Decreased knowledge of precautions;Impaired sensation  PT Treatment Interventions DME instruction;Gait training;Stair training;Functional mobility training;Therapeutic activities;Therapeutic exercise;Balance training;Cognitive  remediation;Patient/family education   PT Goals (Current goals can be found in the Care Plan section) Acute Rehab PT Goals Patient Stated Goal: return home wiithout using an assistive device PT Goal Formulation: With patient Time For Goal Achievement: 11/13/14 Potential to Achieve Goals: Good    Frequency Min 3X/week   Barriers to discharge        Co-evaluation               End of Session Equipment Utilized During Treatment: Gait belt;Oxygen Activity Tolerance: Patient tolerated treatment well Patient left: in bed;with call bell/phone within reach Nurse Communication: Mobility status         Time: 5277-8242 PT Time Calculation (min) (ACUTE ONLY): 13 min   Charges:   PT Evaluation $Initial PT Evaluation Tier I: 1 Procedure     PT G Codes:        Emmely Bittinger 2014/11/18, 3:53 PM Pager [phone]

## 2014-11-06 NOTE — Progress Notes (Signed)
      [address]       [place] [address]             [phone]      .up in chair  BP 103/49 mmHg  Pulse 81  Temp(Src) 101.1 F (38.4 C) (Oral)  Resp 24  Ht 5\' 7"  (1.702 m)  Wt 196 lb 3.4 oz (89 kg)  BMI 30.72 kg/m2  SpO2 91%   Intake/Output Summary (Last 24 hours) at 11/06/14 1828 Last data filed at 11/06/14 1200  Gross per 24 hour  Intake 1099.13 ml  Output   1200 ml  Net -100.87 ml    Ambulated today  Remo Lipps C. Roxan Hockey, MD [facility] [phone]

## 2014-11-06 NOTE — Progress Notes (Signed)
PT Cancellation Note  Patient Details Name: [name] MRN: [id number] DOB: February 01, 1935   Cancelled Treatment:    Reason Eval/Treat Not Completed: Patient at procedure or test/unavailable. Pt just returning to chair from walking in hall with RN. Will see later today.   Mercedees Convery 11/06/2014, 11:26 AM Pager [phone]

## 2014-11-07 ENCOUNTER — Inpatient Hospital Stay (HOSPITAL_COMMUNITY): Payer: Medicare HMO

## 2014-11-07 DIAGNOSIS — I2510 Atherosclerotic heart disease of native coronary artery without angina pectoris: Secondary | ICD-10-CM | POA: Insufficient documentation

## 2014-11-07 LAB — BASIC METABOLIC PANEL
Anion gap: 10 (ref 5–15)
BUN: 11 mg/dL (ref 6–23)
CO2: 25 mmol/L (ref 19–32)
Calcium: 8.5 mg/dL (ref 8.4–10.5)
Chloride: 102 mmol/L (ref 96–112)
Creatinine, Ser: 0.8 mg/dL (ref 0.50–1.35)
GFR calc Af Amer: >90 mL/min (ref >90)
GFR calc non Af Amer: 84 mL/min — ABNORMAL LOW (ref >90)
Glucose, Bld: 147 mg/dL — ABNORMAL HIGH (ref 70–99)
Potassium: 3.7 mmol/L (ref 3.5–5.1)
Sodium: 137 mmol/L (ref 135–145)

## 2014-11-07 LAB — CBC
HCT: 31.3 % — ABNORMAL LOW (ref 39.0–52.0)
Hemoglobin: 10.4 g/dL — ABNORMAL LOW (ref 13.0–17.0)
MCH: 29.3 pg (ref 26.0–34.0)
MCHC: 33.2 g/dL (ref 30.0–36.0)
MCV: 88.2 fL (ref 78.0–100.0)
Platelets: 226 10*3/uL (ref 150–400)
RBC: 3.55 MIL/uL — ABNORMAL LOW (ref 4.22–5.81)
RDW: 13.9 % (ref 11.5–15.5)
WBC: 12.2 10*3/uL — ABNORMAL HIGH (ref 4.0–10.5)

## 2014-11-07 LAB — GLUCOSE, CAPILLARY
GLUCOSE-CAPILLARY: 135 mg/dL — AB (ref 70–99)
Glucose-Capillary: 166 mg/dL — ABNORMAL HIGH (ref 70–99)
Glucose-Capillary: 124 mg/dL — ABNORMAL HIGH (ref 70–99)
Glucose-Capillary: 115 mg/dL — ABNORMAL HIGH (ref 70–99)

## 2014-11-07 MED ORDER — POTASSIUM CHLORIDE CRYS ER 20 MEQ PO TBCR
20.0000 meq | EXTENDED_RELEASE_TABLET | ORAL | Status: DC | PRN
  Administered 2014-11-07: 20 meq via ORAL

## 2014-11-07 MED ORDER — LISINOPRIL 2.5 MG PO TABS
2.5000 mg | ORAL_TABLET | Freq: Every day | ORAL | Status: DC
  Administered 2014-11-07 – 2014-11-08 (×2): 2.5 mg via ORAL
  Filled 2014-11-07 (×3): qty 1

## 2014-11-07 MED ORDER — POTASSIUM CHLORIDE CRYS ER 20 MEQ PO TBCR
20.0000 meq | EXTENDED_RELEASE_TABLET | Freq: Two times a day (BID) | ORAL | Status: DC
  Administered 2014-11-07 – 2014-11-09 (×5): 20 meq via ORAL
  Filled 2014-11-07 (×5): qty 1

## 2014-11-07 MED ORDER — METOPROLOL TARTRATE 25 MG PO TABS
25.0000 mg | ORAL_TABLET | Freq: Two times a day (BID) | ORAL | Status: DC
  Administered 2014-11-07 – 2014-11-09 (×5): 25 mg via ORAL
  Filled 2014-11-07 (×6): qty 1

## 2014-11-07 MED ORDER — OXYCODONE HCL 5 MG PO TABS
5.0000 mg | ORAL_TABLET | ORAL | Status: DC | PRN
  Administered 2014-11-07 – 2014-11-08 (×2): 5 mg via ORAL
  Filled 2014-11-07 (×2): qty 1

## 2014-11-07 NOTE — Discharge Summary (Signed)
[address]       [place] [address]             [phone]              Discharge Summary  Name: [name] DOB: 09/07/1934 80 y.o. MRN: [id number]   Admission Date: 10/31/2014 Discharge Date: 11/09/2014    Admitting Diagnosis: Severe left main coronary artery disease Unstable angina   Discharge Diagnosis:  Severe left main coronary artery disease Unstable angina Expected postoperative blood loss anemia  Past Medical History  Diagnosis Date  . Coronary atherosclerosis of unspecified type of vessel, native or graft     s/p stent mid LAD 2005  . DJD (degenerative joint disease)   . GERD (gastroesophageal reflux disease)   . DM (diabetes mellitus)   . HTN (hypertension)   . Hyperlipidemia       Procedures: CORONARY ARTERY BYPASS GRAFTING x 4  Left internal mammary artery to left anterior descending  Saphenous vein graft to diagonal  Saphenous vein graft to posterior descending  Saphenous vein graft to obtuse marginal ENDOSCOPIC RIGHT GREATER SAPHENOUS VEIN HARVEST   - 11/03/2014    HPI:  The patient is a 80 y.o. male with a history of CAD, s/p placement of drug eluting stent in mid LAD in January of 2005 with residual disease in proximal LAD and RCA. Cardiac cath 07/27/11 showed stable moderate CAD with patent LAD stent. He presented to the cardiology office on 10/27/2014 complaining of progressive chest pain over the past few weeks. The pain occurs with activity and is relieved with rest. It is associated with shortness of breath.  He was seen by Truitt Merle, NP, and based on his anginal-type symptoms, he was scheduled for cardiac catheterization.  This was performed on 4/22 by Dr. Burt Knack and showed severe distal left main stenosis, with patent LAD stent, severe diffuse mid and distal LAD stenosis, diffuse nonobstructive left circumflex and RCA stenoses, and normal LV function.  The patient was admitted following catheterization for  further workup.    Hospital Course:  The patient was admitted to [facility] on 10/31/2014. Cardiac surgery was consulted for consideration of CABG. Dr. Prescott Gum saw the patient and reviewed his films and agreed with the need for surgical revascularization. All risks, benefits and alternatives of surgery were explained in detail, and the patient agreed to proceed. The patient was taken to the operating room and underwent the above procedure.    The postoperative course has generally been uneventful.  He did have frequent PACs early on and was started on po Amiodarone. He developed nausea which was felt to be worsened by the Amiodarone, so this was discontinued. He has since remained in sinus rhythm and blood pressures have been controlled. Plavix was restarted for his previous drug eluting stents.  He has had a mild postop blood loss anemia, but has not required transfusion.  The patient is ambulating in the halls without difficulty and is tolerating a regular diet.  Incisions are healing well.  Overall, he is progressing well, and is felt to be medically stable on today's date for discharge home.     Recent vital signs:  Filed Vitals:   11/09/14 0617  BP: 147/70  Pulse: 92  Temp: 98.5 F (36.9 C)  Resp: 18    Recent laboratory studies:  CBC:  Recent Labs  11/07/14 0010 11/08/14 0303  WBC 12.2* 8.5  HGB 10.4* 9.2*  HCT 31.3*  27.6*  PLT 226 220   BMET:   Recent Labs  11/07/14 0010 11/08/14 0303  NA 137 138  K 3.7 3.7  CL 102 106  CO2 25 24  GLUCOSE 147* 146*  BUN 11 14  CREATININE 0.80 0.79  CALCIUM 8.5 8.1*    PT/INR: No results for input(s): LABPROT, INR in the last 72 hours.   Discharge Medications:     Medication List    STOP taking these medications        atenolol-chlorthalidone 50-25 MG per tablet  Commonly known as:  TENORETIC     isosorbide mononitrate 60 MG 24 hr tablet  Commonly known as:  IMDUR     nitroGLYCERIN 0.4 MG SL tablet  Commonly  known as:  NITROSTAT      TAKE these medications        acetaminophen 500 MG tablet  Commonly known as:  TYLENOL  Take 1,000 mg by mouth 2 (two) times daily.     aspirin EC 81 MG tablet  Take 81 mg by mouth daily.     clopidogrel 75 MG tablet  Commonly known as:  PLAVIX  Take 1 tablet (75 mg total) by mouth daily.     Flax Seed Oil 1000 MG Caps  Take 1 capsule by mouth 2 (two) times daily.     fluticasone 50 MCG/ACT nasal spray  Commonly known as:  FLONASE  Place 2 sprays into both nostrils daily as needed for allergies or rhinitis.     furosemide 40 MG tablet  Commonly known as:  LASIX  Take 1 tablet (40 mg total) by mouth daily. For 7 Days     lisinopril 5 MG tablet  Commonly known as:  PRINIVIL,ZESTRIL  Take 1 tablet (5 mg total) by mouth daily.     metoprolol tartrate 25 MG tablet  Commonly known as:  LOPRESSOR  Take 1 tablet (25 mg total) by mouth 2 (two) times daily.     oxyCODONE 5 MG immediate release tablet  Commonly known as:  Oxy IR/ROXICODONE  Take 1 tablet (5 mg total) by mouth every 3 (three) hours as needed for severe pain.     potassium chloride SA 20 MEQ tablet  Commonly known as:  K-DUR,KLOR-CON  Take 1 tablet (20 mEq total) by mouth daily. For 7 Days     pravastatin 20 MG tablet  Commonly known as:  PRAVACHOL  Take 1 tablet (20 mg total) by mouth every evening.     traMADol 50 MG tablet  Commonly known as:  ULTRAM  Take 1 tablet (50 mg total) by mouth every 8 (eight) hours as needed.         Discharge Instructions:  The patient is to refrain from driving, heavy lifting or strenuous activity.  May shower daily and clean incisions with soap and water.  May resume regular diet.   Follow Up:    Follow-up Information    Follow up with Ivin Poot III, MD On 12/17/2014.   Specialty:  Cardiothoracic Surgery   Why:  Have a chest x-ray at [place] at 8:30,then see MD at 9:30   Contact information:   [place] [address] [phone]       Follow up with Richardson Dopp, PA-C On 11/20/2014.   Specialty:  Physician Assistant   Why:  Appointment is at 3:40   Contact information:   [address] [place] [address] [phone]  The patient has been discharged on:  1.Beta Blocker: Yes [ x ]  No [ ]   If No, reason:    2.Ace Inhibitor/ARB: Yes [ x ]  No [  ]  If No, reason:    3.Statin: Yes [ x ]  No [ ]   If No, reason:    4.Ecasa: Yes [ x ]  No [ ]   If No, reason:   Follow-up Information    Follow up with Ivin Poot III, MD On 12/17/2014.   Specialty:  Cardiothoracic Surgery   Why:  Have a chest x-ray at [place] at 8:30,then see MD at 9:30   Contact information:   [place] [address] [phone]       Follow up with Richardson Dopp, PA-C On 11/20/2014.   Specialty:  Physician Assistant   Why:  Appointment is at 3:40   Contact information:   [address] [place] [address] [phone]        Ellwood Handler 11/09/2014, 9:30 AM

## 2014-11-07 NOTE — Progress Notes (Signed)
CARDIAC REHAB PHASE I   PRE:  Rate/Rhythm: 90 ? SR    BP: sitting 120/61    SaO2: 95 RA  MODE:  Ambulation: 350 ft   POST:  Rate/Rhythm: 97 afib    BP: sitting 156/65     SaO2: 98 RA  Pt able to get OOB with min assist and ambulate with RW. No major c/o. Pt with afib during walk, asx. Return to bed for nap. Encouraged third walk with nursing.  [phone]   Josephina Shih [place] CES, ACSM 11/07/2014 3:14 PM

## 2014-11-07 NOTE — Progress Notes (Signed)
Physical Therapy Treatment Patient Details Name: [name] MRN: [id number] DOB: 28-Jul-1934 Today's Date: 11/07/2014    History of Present Illness Adm 10/31/14 and underwent cardiac cath followed by CABG 4/26. PMHx- fall 01/2014 with SDH and evacuation of hematoma    PT Comments    Pt progressing well. Able to state sternal precautions, although required cues to adhere to these during functional mobility.   Follow Up Recommendations  Home health PT;Supervision for mobility/OOB     Equipment Recommendations  None recommended by PT    Recommendations for Other Services OT consult     Precautions / Restrictions Precautions Precautions: Fall Precaution Comments: denies any falls since 01/2014 Restrictions Weight Bearing Restrictions: No (sternal precautions)    Mobility  Bed Mobility                  Transfers Overall transfer level: Needs assistance Equipment used: Rolling walker (2 wheeled) Transfers: Sit to/from Stand Sit to Stand: Min assist         General transfer comment: vc to maintain sternal precautions and safe use of RW; assist for anterior wtshift  Ambulation/Gait Ambulation/Gait assistance: Min guard Ambulation Distance (Feet): 350 Feet Assistive device: Rolling walker (2 wheeled) Gait Pattern/deviations: Step-through pattern;Decreased stride length;Trunk flexed   Gait velocity interpretation: Below normal speed for age/gender General Gait Details: Good pace; no standing rest breaks; vc for proper distance to RW (especially with turns)   Financial trader Rankin (Stroke Patients Only)       Balance                                    Cognition Arousal/Alertness: Awake/alert Behavior During Therapy: WFL for tasks assessed/performed Overall Cognitive Status: Within Functional Limits for tasks assessed                      Exercises      General Comments         Pertinent Vitals/Pain SaO2 95-96% on RA while walking  Pain Assessment: 0-10 Pain Score: 2  Pain Location: chest Pain Intervention(s): Limited activity within patient's tolerance;Monitored during session    Home Living Family/patient expects to be discharged to:: Private residence Living Arrangements: Spouse/significant other Available Help at Discharge: Family Type of Home: House Home Access: Stairs to enter   Home Layout: Multi-level Home Equipment: Kasandra Knud[place] - single point;Walker - 2 wheels;Grab bars - tub/shower;Bedside commode      Prior Function Level of Independence: Independent with assistive device(s)      Comments: pt used cane for ambulation occasionally; furniture walking inside   PT Goals (current goals can now be found in the care plan section) Acute Rehab PT Goals Patient Stated Goal: return home wiithout using an assistive device Time For Goal Achievement: 11/13/14 Progress towards PT goals: Progressing toward goals    Frequency  Min 3X/week    PT Plan Current plan remains appropriate    Co-evaluation             End of Session Equipment Utilized During Treatment: Gait belt Activity Tolerance: Patient tolerated treatment well Patient left: in chair;with nursing/sitter in room;with family/visitor present (w/c for transport)     Time: 4235-3614 PT Time Calculation (min) (ACUTE ONLY): 13 min  Charges:  $Gait Training: 8-22 mins  G Codes:      [name] 11/07/2014, 11:53 AM Pager [phone]

## 2014-11-07 NOTE — Progress Notes (Addendum)
4 Days Post-Op Procedure(s) (LRB): CORONARY ARTERY BYPASS GRAFTING (CABG), ON PUMP, TIMES FOUR, USING LEFT INTERNAL MAMMARY, RIGHT GREATER SAPHENOUS VEIN HARVESTED ENDOSCOPICALLY (N/A) TRANSESOPHAGEAL ECHOCARDIOGRAM (TEE) (N/A) Subjective: GI complaints better after BM, amio stopped CXR clear NSR with PAC  Objective: Vital signs in last 24 hours: Temp:  [97.8 F (36.6 C)-101.1 F (38.4 C)] 98.7 F (37.1 C) (04/29 0749) Pulse Rate:  [69-102] 83 (04/29 0700) Cardiac Rhythm:  [-] Normal sinus rhythm (04/28 1900) Resp:  [17-30] 22 (04/29 0700) BP: (103-157)/(45-82) 125/62 mmHg (04/29 0700) SpO2:  [91 %-98 %] 94 % (04/29 0700) Weight:  [195 lb 1.7 oz (88.5 kg)] 195 lb 1.7 oz (88.5 kg) (04/29 0200)  Hemodynamic parameters for last 24 hours:   stable Intake/Output from previous day: 04/28 0701 - 04/29 0700 In: 1125 [P.O.:900; I.V.:75; IV Piggyback:150] Out: 1900 [Urine:1900] Intake/Output this shift:    abd soft  Lab Results:  Recent Labs  11/06/14 0603 11/07/14 0010  WBC 10.6* 12.2*  HGB 9.2* 10.4*  HCT 28.0* 31.3*  PLT 144* 226   BMET:  Recent Labs  11/06/14 0603 11/07/14 0010  NA 136 137  K 3.7 3.7  CL 103 102  CO2 25 25  GLUCOSE 144* 147*  BUN 13 11  CREATININE 0.83 0.80  CALCIUM 7.8* 8.5    PT/INR: No results for input(s): LABPROT, INR in the last 72 hours. ABG    Component Value Date/Time   PHART 7.371 11/04/2014 0013   HCO3 20.4 11/04/2014 0013   TCO2 21 11/04/2014 1626   ACIDBASEDEF 4.0* 11/04/2014 0013   O2SAT 97.0 11/04/2014 0013   CBG (last 3)   Recent Labs  11/06/14 1641 11/06/14 2149 11/07/14 0747  GLUCAP 142* 127* 135*    Assessment/Plan: S/P Procedure(s) (LRB): CORONARY ARTERY BYPASS GRAFTING (CABG), ON PUMP, TIMES FOUR, USING LEFT INTERNAL MAMMARY, RIGHT GREATER SAPHENOUS VEIN HARVESTED ENDOSCOPICALLY (N/A) TRANSESOPHAGEAL ECHOCARDIOGRAM (TEE) (N/A) Transfer 2 W reg bed Plan home with HHN  LOS: 7 days    Tharon Aquas Trigt  III 11/07/2014

## 2014-11-07 NOTE — Evaluation (Signed)
Clinical/Bedside Swallow Evaluation Patient Details  Name: [name] MRN: [id number] Date of Birth: 03-Oct-1934  Today's Date: 11/07/2014 Time: SLP Start Time (ACUTE ONLY): 1148 SLP Stop Time (ACUTE ONLY): 1159 SLP Time Calculation (min) (ACUTE ONLY): 11 min  Past Medical History:  Past Medical History  Diagnosis Date  . Coronary atherosclerosis of unspecified type of vessel, native or graft     s/p stent mid LAD 2005  . DJD (degenerative joint disease)   . GERD (gastroesophageal reflux disease)   . DM (diabetes mellitus)   . HTN (hypertension)   . Hyperlipidemia    Past Surgical History:  Past Surgical History  Procedure Laterality Date  . Ptca      Hx of it.   . Knee surgery    . Kidney stone removal    . Sigmoidoscopy    . Total knee arthroplasty  02/2011    Right knee  . Craniotomy N/A 01/18/2014    Procedure: CRANIOTOMY HEMATOMA EVACUATION SUBDURAL;  Surgeon: Hosie Spangle, MD;  Location: [facility];  Service: Neurosurgery;  Laterality: N/A;  . Left heart catheterization with coronary angiogram N/A 07/27/2011    Procedure: LEFT HEART CATHETERIZATION WITH CORONARY ANGIOGRAM;  Surgeon: Burnell Blanks, MD;  Location[facility] CATH LAB;  Service: Cardiovascular;  Laterality: N/A;  . Cardiac catheterization    . Brain surgery    . Left heart catheterization with coronary angiogram N/A 10/31/2014    Procedure: LEFT HEART CATHETERIZATION WITH CORONARY ANGIOGRAM;  Surgeon: Sherren Mocha, MD;  Location: [facility] CATH LAB;  Service: Cardiovascular;  Laterality: N/A;  . Coronary artery bypass graft N/A 11/03/2014    Procedure: CORONARY ARTERY BYPASS GRAFTING (CABG), ON PUMP, TIMES FOUR, USING LEFT INTERNAL MAMMARY, RIGHT GREATER SAPHENOUS VEIN HARVESTED ENDOSCOPICALLY;  Surgeon: Ivin Poot, MD;  Location: [place];  Service: Open Heart Surgery;  Laterality: N/A;  LIMA-LAD; SVG-DIAG; SVG-OM; SVG-RCA  . Tee without cardioversion N/A 11/03/2014    Procedure: TRANSESOPHAGEAL ECHOCARDIOGRAM  (TEE);  Surgeon: Ivin Poot, MD;  Location: [place];  Service: Open Heart Surgery;  Laterality: N/A;   HPI:  Adm 10/31/14 and underwent cardiac cath followed by CABG 4/26. PMHx- fall 01/2014 with SDH and evacuation of hematoma   Assessment / Plan / Recommendation Clinical Impression  Pt presents with functional oropharyngeal swallow with adequate mastication, swift swallow response, no overt s/s aspiration, adequate ventilatory/swallowing coordination.  Recommend continuing diet as ordered - no dysphagia identified.  SLP to sign off.     Aspiration Risk  Mild    Diet Recommendation Regular;Thin liquid   Liquid Administration via: Straw;Cup Medication Administration: Whole meds with liquid Supervision: Patient able to self feed    Other  Recommendations Oral Care Recommendations: Oral care BID   Follow Up Recommendations  None        Swallow Study Prior Functional Status  Type of Home: House Available Help at Discharge: Family    General Date of Onset: 11/03/14 HPI: Adm 10/31/14 and underwent cardiac cath followed by CABG 4/26. PMHx- fall 01/2014 with SDH and evacuation of hematoma Type of Study: Bedside swallow evaluation Previous Swallow Assessment: none Diet Prior to this Study: Regular;Thin liquids Temperature Spikes Noted: No Respiratory Status: Room air History of Recent Intubation: Yes Length of Intubations (days): 1 days Date extubated: 11/04/14 Behavior/Cognition: Alert;Cooperative;Pleasant mood Oral Cavity - Dentition: Adequate natural dentition Self-Feeding Abilities: Able to feed self Patient Positioning: Upright in chair Baseline Vocal Quality: Clear Volitional Cough: Strong Volitional Swallow: Able to elicit  Oral/Motor/Sensory Function Overall Oral Motor/Sensory Function: Appears within functional limits for tasks assessed   Ice Chips Ice chips: Within functional limits   Thin Liquid Thin Liquid: Within functional limits Presentation: Cup;Straw     Nectar Thick Nectar Thick Liquid: Not tested   Honey Thick Honey Thick Liquid: Not tested   Puree Puree: Within functional limits Presentation: Self Fed;Spoon   Solid  Krista Som L. [place], [place] CCC/SLP Pager [phone]     Solid: Within functional limits       Juan Quam Laurice 11/07/2014,12:03 PM

## 2014-11-08 LAB — COMPREHENSIVE METABOLIC PANEL
ALT: 25 U/L (ref 0–53)
AST: 21 U/L (ref 0–37)
Albumin: 2.8 g/dL — ABNORMAL LOW (ref 3.5–5.2)
Alkaline Phosphatase: 47 U/L (ref 39–117)
Anion gap: 8 (ref 5–15)
BUN: 14 mg/dL (ref 6–23)
CO2: 24 mmol/L (ref 19–32)
Calcium: 8.1 mg/dL — ABNORMAL LOW (ref 8.4–10.5)
Chloride: 106 mmol/L (ref 96–112)
Creatinine, Ser: 0.79 mg/dL (ref 0.50–1.35)
GFR calc Af Amer: >90 mL/min (ref >90)
GFR calc non Af Amer: 84 mL/min — ABNORMAL LOW (ref >90)
Glucose, Bld: 146 mg/dL — ABNORMAL HIGH (ref 70–99)
Potassium: 3.7 mmol/L (ref 3.5–5.1)
Sodium: 138 mmol/L (ref 135–145)
Total Bilirubin: 0.5 mg/dL (ref 0.3–1.2)
Total Protein: 5.3 g/dL — ABNORMAL LOW (ref 6.0–8.3)

## 2014-11-08 LAB — GLUCOSE, CAPILLARY
GLUCOSE-CAPILLARY: 100 mg/dL — AB (ref 70–99)
GLUCOSE-CAPILLARY: 131 mg/dL — AB (ref 70–99)
Glucose-Capillary: 123 mg/dL — ABNORMAL HIGH (ref 70–99)
Glucose-Capillary: 107 mg/dL — ABNORMAL HIGH (ref 70–99)

## 2014-11-08 LAB — CBC
HCT: 27.6 % — ABNORMAL LOW (ref 39.0–52.0)
Hemoglobin: 9.2 g/dL — ABNORMAL LOW (ref 13.0–17.0)
MCH: 28.8 pg (ref 26.0–34.0)
MCHC: 33.3 g/dL (ref 30.0–36.0)
MCV: 86.3 fL (ref 78.0–100.0)
Platelets: 220 10*3/uL (ref 150–400)
RBC: 3.2 MIL/uL — ABNORMAL LOW (ref 4.22–5.81)
RDW: 13.8 % (ref 11.5–15.5)
WBC: 8.5 10*3/uL (ref 4.0–10.5)

## 2014-11-08 NOTE — Progress Notes (Addendum)
      [name]       [place] [address]             [phone]      5 Days Post-Op Procedure(s) (LRB): CORONARY ARTERY BYPASS GRAFTING (CABG), ON PUMP, TIMES FOUR, USING LEFT INTERNAL MAMMARY, RIGHT GREATER SAPHENOUS VEIN HARVESTED ENDOSCOPICALLY (N/A) TRANSESOPHAGEAL ECHOCARDIOGRAM (TEE) (N/A)   Subjective:  [name] has no complaints.  States he is feeling good.  He is ambulating independently.  +_ BM  Objective: Vital signs in last 24 hours: Temp:  [97.6 F (36.4 C)-98.6 F (37 C)] 97.8 F (36.6 C) (04/30 0348) Pulse Rate:  [74-88] 74 (04/30 0900) Cardiac Rhythm:  [-] Normal sinus rhythm (04/30 0138) Resp:  [20-21] 20 (04/30 0348) BP: (111-149)/(48-77) 118/64 mmHg (04/30 0900) SpO2:  [92 %-100 %] 96 % (04/30 0348) Weight:  [190 lb 9.6 oz (86.456 kg)] 190 lb 9.6 oz (86.456 kg) (04/30 0348)  Intake/Output from previous day: 04/29 0701 - 04/30 0700 In: 1110 [P.O.:1110] Out: 1660 [Urine:1660]  General appearance: alert, cooperative and no distress Heart: regular rate and rhythm Lungs: clear to auscultation bilaterally Abdomen: soft, non-tender; bowel sounds normal; no masses,  no organomegaly Extremities: edema trace Wound: clean and dry  Lab Results:  Recent Labs  11/07/14 0010 11/08/14 0303  WBC 12.2* 8.5  HGB 10.4* 9.2*  HCT 31.3* 27.6*  PLT 226 220   BMET:  Recent Labs  11/07/14 0010 11/08/14 0303  NA 137 138  K 3.7 3.7  CL 102 106  CO2 25 24  GLUCOSE 147* 146*  BUN 11 14  CREATININE 0.80 0.79  CALCIUM 8.5 8.1*    PT/INR: No results for input(s): LABPROT, INR in the last 72 hours. ABG    Component Value Date/Time   PHART 7.371 11/04/2014 0013   HCO3 20.4 11/04/2014 0013   TCO2 21 11/04/2014 1626   ACIDBASEDEF 4.0* 11/04/2014 0013   O2SAT 97.0 11/04/2014 0013   CBG (last 3)   Recent Labs  11/07/14 1618 11/07/14 2131 11/08/14 0638  GLUCAP 124* 115* 100*    Assessment/Plan: S/P Procedure(s) (LRB): CORONARY  ARTERY BYPASS GRAFTING (CABG), ON PUMP, TIMES FOUR, USING LEFT INTERNAL MAMMARY, RIGHT GREATER SAPHENOUS VEIN HARVESTED ENDOSCOPICALLY (N/A) TRANSESOPHAGEAL ECHOCARDIOGRAM (TEE) (N/A)  1. CV- NSR continue Lopressor, Lisinopril 2. Pulm- no acute issues, off oxygen, continue IS 3. Renal- creatinine WNL, volume status stable, continue Lasix 4. DM- continue current regimen 5. DIsop- patient doing well, will d/c EPW today, likely home in the AM   LOS: 8 days    BARRETT, ERIN 11/08/2014  I have seen and examined [name] and agree with the above assessment  and plan.  Grace Isaac MD Beeper [phone] Office [phone] 11/08/2014 2:09 PM

## 2014-11-08 NOTE — Progress Notes (Signed)
CARDIAC REHAB PHASE I   PRE:  Rate/Rhythm: 92  BP:  Sitting: 154 60 right arm (manual)    SaO2: 92% ra  MODE:  Ambulation: 420 ft   POST:  Rate/Rhythm: 110 bpm  BP:  Sitting: 174/60 right arm 154/60 left arm (manual)    SaO2: 97% ra  9:58-10:21 Patient ambulated at a steady pace. No complaints of pain or dizziness. Only complaint was for shortness of breath. Nurse made aware of discrepancy in right and left blood pressure. Patient left sitting on edge of bed with company in the room.   Mitchelle Goerner, [place], [place] 11/08/2014 10:17 AM  3

## 2014-11-08 NOTE — Progress Notes (Signed)
Epicardial pacing wires discontinued per md order and protocol.  Sutures were clipped and wires were removed without difficulty.  No drainage from wire sites.  Chest tube sutures are still in place, dry/intact.  Pt had no complaints during or following wire removal.  VS remained stable.  Will continue to monitor.

## 2014-11-09 LAB — GLUCOSE, CAPILLARY
GLUCOSE-CAPILLARY: 129 mg/dL — AB (ref 70–99)
Glucose-Capillary: 120 mg/dL — ABNORMAL HIGH (ref 70–99)

## 2014-11-09 MED ORDER — LISINOPRIL 5 MG PO TABS: 5.0000 mg | ORAL_TABLET | Freq: Every day | ORAL | Status: DC

## 2014-11-09 MED ORDER — OXYCODONE HCL 5 MG PO TABS: 5.0000 mg | ORAL_TABLET | ORAL | Status: DC | PRN

## 2014-11-09 MED ORDER — FUROSEMIDE 40 MG PO TABS: 40.0000 mg | ORAL_TABLET | Freq: Every day | ORAL | Status: DC

## 2014-11-09 MED ORDER — CLOPIDOGREL BISULFATE 75 MG PO TABS: 75.0000 mg | ORAL_TABLET | Freq: Every day | ORAL | Status: DC

## 2014-11-09 MED ORDER — METOPROLOL TARTRATE 25 MG PO TABS: 25.0000 mg | ORAL_TABLET | Freq: Two times a day (BID) | ORAL | Status: DC

## 2014-11-09 MED ORDER — LISINOPRIL 5 MG PO TABS
5.0000 mg | ORAL_TABLET | Freq: Every day | ORAL | Status: DC
  Administered 2014-11-09: 5 mg via ORAL
  Filled 2014-11-09: qty 1

## 2014-11-09 MED ORDER — POTASSIUM CHLORIDE CRYS ER 20 MEQ PO TBCR: 20.0000 meq | EXTENDED_RELEASE_TABLET | Freq: Every day | ORAL | Status: DC

## 2014-11-09 NOTE — Discharge Instructions (Signed)
Coronary Artery Bypass Grafting, Care After °Refer to this sheet in the next few weeks. These instructions provide you with information on caring for yourself after your procedure. Your health care provider may also give you more specific instructions. Your treatment has been planned according to current medical practices, but problems sometimes occur. Call your health care provider if you have any problems or questions after your procedure. °WHAT TO EXPECT AFTER THE PROCEDURE °Recovery from surgery will be different for everyone. Some people feel well after 3 or 4 weeks, while for others it takes longer. After your procedure, it is typical to have the following: °· Nausea and a lack of appetite.   °· Constipation. °· Weakness and fatigue.   °· Depression or irritability.   °· Pain or discomfort at your incision site. °HOME CARE INSTRUCTIONS °· Take medicines only as directed by your health care provider. Do not stop taking medicines or start any new medicines without first checking with your health care provider. °· Take your pulse as directed by your health care provider. °· Perform deep breathing as directed by your health care provider. If you were given a device called an incentive spirometer, use it to practice deep breathing several times a day. Support your chest with a pillow or your arms when you take deep breaths or cough. °· Keep incision areas clean, dry, and protected. Remove or change any bandages (dressings) only as directed by your health care provider. You may have skin adhesive strips over the incision areas. Do not take the strips off. They will fall off on their own. °· Check incision areas daily for any swelling, redness, or drainage. °· If incisions were made in your legs, do the following: °¨ Avoid crossing your legs.   °¨ Avoid sitting for long periods of time. Change positions every 30 minutes.   °¨ Elevate your legs when you are sitting. °· Wear compression stockings as directed by your  health care provider. These stockings help keep blood clots from forming in your legs. °· Take showers once your health care provider approves. Until then, only take sponge baths. Pat incisions dry. Do not rub incisions with a washcloth or towel. Do not take baths, swim, or use a hot tub until your health care provider approves. °· Eat foods that are high in fiber, such as raw fruits and vegetables, whole grains, beans, and nuts. Meats should be lean cut. Avoid canned, processed, and fried foods. °· Drink enough fluid to keep your urine clear or pale yellow. °· Weigh yourself every day. This helps identify if you are retaining fluid that may make your heart and lungs work harder. °· Rest and limit activity as directed by your health care provider. You may be instructed to: °¨ Stop any activity at once if you have chest pain, shortness of breath, irregular heartbeats, or dizziness. Get help right away if you have any of these symptoms. °¨ Move around frequently for short periods or take short walks as directed by your health care provider. Increase your activities gradually. You may need physical therapy or cardiac rehabilitation to help strengthen your muscles and build your endurance. °¨ Avoid lifting, pushing, or pulling anything heavier than 10 lb (4.5 kg) for at least 6 weeks after surgery. °· Do not drive until your health care provider approves.  °· Ask your health care provider when you may return to work. °· Ask your health care provider when you may resume sexual activity. °· Keep all follow-up visits as directed by your health care   provider. This is important. °SEEK MEDICAL CARE IF: °· You have swelling, redness, increasing pain, or drainage at the site of an incision. °· You have a fever. °· You have swelling in your ankles or legs. °· You have pain in your legs.   °· You gain 2 or more pounds (0.9 kg) a day. °· You are nauseous or vomit. °· You have diarrhea.  °SEEK IMMEDIATE MEDICAL CARE IF: °· You have  chest pain that goes to your jaw or arms. °· You have shortness of breath.   °· You have a fast or irregular heartbeat.   °· You notice a "clicking" in your breastbone (sternum) when you move.   °· You have numbness or weakness in your arms or legs. °· You feel dizzy or light-headed.   °MAKE SURE YOU: °· Understand these instructions. °· Will watch your condition. °· Will get help right away if you are not doing well or get worse. °Document Released: 01/14/2005 Document Revised: 11/11/2013 Document Reviewed: 12/04/2012 °Exit[employer]® Patient Information ©2015 [employer]. This information is not intended to replace advice given to you by your health care provider. Make sure you discuss any questions you have with your health care provider. ° °Endoscopic Saphenous Vein Harvesting °Care After °Refer to this sheet in the next few weeks. These instructions provide you with information on caring for yourself after your procedure. Your health care provider may also give you more specific instructions. Your treatment has been planned according to current medical practices, but problems sometimes occur. Call your health care provider if you have any problems or questions after your procedure. °HOME CARE INSTRUCTIONS °Medicine °· Take whatever pain medicine your surgeon prescribes. Follow the directions carefully. Do not take over-the-counter pain medicine unless your surgeon says it is okay. Some pain medicine can cause bleeding problems for several weeks after surgery. °· Follow your surgeon's instructions about driving. You will probably not be permitted to drive after heart surgery. °· Take any medicines your surgeon prescribes. Any medicines you took before your heart surgery should be checked with your health care provider before you start taking them again. °Wound care °· If your surgeon has prescribed an elastic bandage or stocking, ask how long you should wear it. °· Check the area around your surgical cuts  (incisions) whenever your bandages (dressings) are changed. Look for any redness or swelling. °· You will need to return to have the stitches (sutures) or staples taken out. Ask your surgeon when to do that. °· Ask your surgeon when you can shower or bathe. °Activity °· Try to keep your legs raised when you are sitting. °· Do any exercises your health care providers have given you. These may include deep breathing exercises, coughing, walking, or other exercises. °SEEK MEDICAL CARE IF: °· You have any questions about your medicines. °· You have more leg pain, especially if your pain medicine stops working. °· New or growing bruises develop on your leg. °· Your leg swells, feels tight, or becomes red. °· You have numbness in your leg. °SEEK IMMEDIATE MEDICAL CARE IF: °· Your pain gets much worse. °· Blood or fluid leaks from any of the incisions. °· Your incisions become warm, swollen, or red. °· You have chest pain. °· You have trouble breathing. °· You have a fever. °· You have more pain near your leg incision. °MAKE SURE YOU: °· Understand these instructions. °· Will watch your condition. °· Will get help right away if you are not doing well or get worse. °Document Released: 03/09/2011   Document Revised: 07/02/2013 Document Reviewed: 03/09/2011 °ExitCare® Patient Information ©2015 [employer]. This information is not intended to replace advice given to you by your health care provider. Make sure you discuss any questions you have with your health care provider. ° ° °

## 2014-11-09 NOTE — Progress Notes (Signed)
Patient sutures removed.  Patient given prescriptions and discharge paperwork.

## 2014-11-09 NOTE — Progress Notes (Signed)
      [address]       [place] [address]             [phone]      6 Days Post-Op Procedure(s) (LRB): CORONARY ARTERY BYPASS GRAFTING (CABG), ON PUMP, TIMES FOUR, USING LEFT INTERNAL MAMMARY, RIGHT GREATER SAPHENOUS VEIN HARVESTED ENDOSCOPICALLY (N/A) TRANSESOPHAGEAL ECHOCARDIOGRAM (TEE) (N/A)   Subjective:  Mr. Eble states he is doing well.  He is ready to go home.  + ambulation + BM  Objective: Vital signs in last 24 hours: Temp:  [97.8 F (36.6 C)-98.8 F (37.1 C)] 98.5 F (36.9 C) (05/01 0617) Pulse Rate:  [76-98] 92 (05/01 0617) Cardiac Rhythm:  [-] Normal sinus rhythm (04/30 2240) Resp:  [16-18] 18 (05/01 0617) BP: (120-179)/(58-74) 147/70 mmHg (05/01 0617) SpO2:  [94 %-97 %] 96 % (05/01 0617) Weight:  [186 lb 4.6 oz (84.5 kg)] 186 lb 4.6 oz (84.5 kg) (05/01 0617)  Intake/Output from previous day: 04/30 0701 - 05/01 0700 In: 480 [P.O.:480] Out: -  Intake/Output this shift: Total I/O In: 120 [P.O.:120] Out: -   General appearance: alert, cooperative and no distress Heart: regular rate and rhythm Lungs: clear to auscultation bilaterally Abdomen: soft, non-tender; bowel sounds normal; no masses,  no organomegaly Extremities: edema trace Wound: clean and dry  Lab Results:  Recent Labs  11/07/14 0010 11/08/14 0303  WBC 12.2* 8.5  HGB 10.4* 9.2*  HCT 31.3* 27.6*  PLT 226 220   BMET:  Recent Labs  11/07/14 0010 11/08/14 0303  NA 137 138  K 3.7 3.7  CL 102 106  CO2 25 24  GLUCOSE 147* 146*  BUN 11 14  CREATININE 0.80 0.79  CALCIUM 8.5 8.1*    PT/INR: No results for input(s): LABPROT, INR in the last 72 hours. ABG    Component Value Date/Time   PHART 7.371 11/04/2014 0013   HCO3 20.4 11/04/2014 0013   TCO2 21 11/04/2014 1626   ACIDBASEDEF 4.0* 11/04/2014 0013   O2SAT 97.0 11/04/2014 0013   CBG (last 3)   Recent Labs  11/08/14 1711 11/08/14 2114 11/09/14 0617  GLUCAP 107* 131* 120*    Assessment/Plan: S/P  Procedure(s) (LRB): CORONARY ARTERY BYPASS GRAFTING (CABG), ON PUMP, TIMES FOUR, USING LEFT INTERNAL MAMMARY, RIGHT GREATER SAPHENOUS VEIN HARVESTED ENDOSCOPICALLY (N/A) TRANSESOPHAGEAL ECHOCARDIOGRAM (TEE) (N/A)  1. CV- NSR, hypertensive this morning, continue Lopressor, will increase Lisinopril 2. Pulm- no acute issues, encouraged use of IS at discharge 3. Renal- creatinine has been stable, hypervolemia improving, will wean Lasix over next few days 4. DM- sugars controlled 5. Dispo- patient doing well, will d/c home today    LOS: 9 days    Kassadi Presswood 11/09/2014

## 2014-11-18 ENCOUNTER — Telehealth (HOSPITAL_COMMUNITY): Payer: Self-pay | Admitting: Cardiac Rehabilitation

## 2014-11-18 NOTE — Telephone Encounter (Signed)
pc to enroll pt in cardiac rehab. Pt undecided would like to discuss with MD at hospital follow up appt. Pt requests call back next week

## 2014-11-20 ENCOUNTER — Encounter: Payer: Self-pay | Admitting: Physician Assistant

## 2014-11-20 ENCOUNTER — Ambulatory Visit (INDEPENDENT_AMBULATORY_CARE_PROVIDER_SITE_OTHER): Payer: Medicare HMO | Admitting: Physician Assistant

## 2014-11-20 VITALS — BP 110/60 | HR 92 | Ht 67.0 in | Wt 182.0 lb

## 2014-11-20 DIAGNOSIS — I10 Essential (primary) hypertension: Secondary | ICD-10-CM | POA: Diagnosis not present

## 2014-11-20 DIAGNOSIS — Z951 Presence of aortocoronary bypass graft: Secondary | ICD-10-CM

## 2014-11-20 DIAGNOSIS — S065X9A Traumatic subdural hemorrhage with loss of consciousness of unspecified duration, initial encounter: Secondary | ICD-10-CM

## 2014-11-20 DIAGNOSIS — E785 Hyperlipidemia, unspecified: Secondary | ICD-10-CM | POA: Diagnosis not present

## 2014-11-20 DIAGNOSIS — I2510 Atherosclerotic heart disease of native coronary artery without angina pectoris: Secondary | ICD-10-CM | POA: Diagnosis not present

## 2014-11-20 DIAGNOSIS — I6200 Nontraumatic subdural hemorrhage, unspecified: Secondary | ICD-10-CM

## 2014-11-20 DIAGNOSIS — S065XAA Traumatic subdural hemorrhage with loss of consciousness status unknown, initial encounter: Secondary | ICD-10-CM

## 2014-11-20 MED ORDER — METOPROLOL TARTRATE 25 MG PO TABS: 37.5000 mg | ORAL_TABLET | Freq: Two times a day (BID) | ORAL | Status: DC

## 2014-11-20 NOTE — Patient Instructions (Signed)
Medication Instructions:  1. INCREASE METOPROLOL TO 37.5 TWICE DAILY; NEW RX SENT IN 2. DECREASE IMDUR TO 30 MG DAILY  Labwork: 1. TODAY BMET 2. FASTING LIPID AND LIVER PANEL TO BE DONE 01/01/15 SAME DAY YOU DR. MCALHANY   Testing/Procedures: NONE  Follow-Up: KEEP YOUR FOLLOW UP WITH DR. Angelena Form  Any Other Special Instructions Will Be Listed Below (If Applicable). CALL CARDIAC REHAB TO ARRANGE APPT

## 2014-11-20 NOTE — Progress Notes (Signed)
Cardiology Office Note   Date:  11/20/2014   ID:  [name], DOB 03-31-1935, MRN [id number]  PCP:  Eliezer Lofts, MD  Cardiologist:  Dr. Lauree Chandler     Chief Complaint  Patient presents with  . Coronary Artery Disease     History of Present Illness: [name] is a 80 y.o. male with a hx of CAD s/p placement of drug eluting stent in mid LAD in January of 2005 with residual disease in proximal LAD and RCA, HTN, HLD and GERD. Cardiac cath 07/27/11 showed stable moderate CAD with patent LAD stent. He has not tolerated statins in the past secondary to myalgias.  He had a SDH requiring evacuation by Dr. Sherwood Gambler in July 2015 with resolution. He was taken off aspirin.   He was recently evaluated by Truitt Merle, NP for chest pain. Cardiac catheterization was arranged. This demonstrated multivessel disease with severe 95% distal left main stenosis.  He was admitted 4/22-5/1. He was seen by Dr. Prescott Gum and underwent CABG 4 (LIMA-LAD, SVG-DX, SVG-PDA, SVG-OM).  Postoperative course was notable for frequent PACs. He is placed on amiodarone but this was discontinued secondary to significant nausea. He otherwise remained in sinus rhythm. Otherwise, his postoperative course was fairly uneventful.  He returns for FU.  He is here with his wife.  He is doing well.  He is walking daily.  Chest is sore but improved. He denies significant DOE.  He denies orthopnea, PND, edema. No syncope.  He has a dry cough.  No fever.     Studies/Reports Reviewed Today:  LHC 10/31/14 LM:  distal left main has severe 95% stenosis at the bifurcation of the LAD and left circumflex. LAD:  severely diseased vessel.  The stented segment mid LAD has mild diffuse in-stent restenosis. Prox 30-40%. Diffuse 80% mid LAD  LCx:  Prox 20-30%, mid 50% stenosis.  RCA: Ostial 50%, mid 50%, mid PDA 50-60% stenosis. LVEF is estimated at 65%, there is no significant mitral regurgitation   Echo 11/01/14 - Mild LVH. EF  60% to 65%. Wall motion was normal; there were no regional wall motion abnormalities. - Left atrium: The atrium was mildly to moderately dilated.   Past Medical History  Diagnosis Date  . Coronary atherosclerosis of unspecified type of vessel, native or graft     s/p stent mid LAD 2005  . DJD (degenerative joint disease)   . GERD (gastroesophageal reflux disease)   . DM (diabetes mellitus)   . HTN (hypertension)   . Hyperlipidemia     Past Surgical History  Procedure Laterality Date  . Ptca      Hx of it.   . Knee surgery    . Kidney stone removal    . Sigmoidoscopy    . Total knee arthroplasty  02/2011    Right knee  . Craniotomy N/A 01/18/2014    Procedure: CRANIOTOMY HEMATOMA EVACUATION SUBDURAL;  Surgeon: Hosie Spangle, MD;  Location: [place];  Service: Neurosurgery;  Laterality: N/A;  . Left heart catheterization with coronary angiogram N/A 07/27/2011    Procedure: LEFT HEART CATHETERIZATION WITH CORONARY ANGIOGRAM;  Surgeon: Burnell Blanks, MD;  Location: [facility] CATH LAB;  Service: Cardiovascular;  Laterality: N/A;  . Cardiac catheterization    . Brain surgery    . Left heart catheterization with coronary angiogram N/A 10/31/2014    Procedure: LEFT HEART CATHETERIZATION WITH CORONARY ANGIOGRAM;  Surgeon: Sherren Mocha, MD;  Location: [facility] CATH LAB;  Service: Cardiovascular;  Laterality: N/A;  . Coronary artery bypass graft N/A 11/03/2014    Procedure: CORONARY ARTERY BYPASS GRAFTING (CABG), ON PUMP, TIMES FOUR, USING LEFT INTERNAL MAMMARY, RIGHT GREATER SAPHENOUS VEIN HARVESTED ENDOSCOPICALLY;  Surgeon: Ivin Poot, MD;  Location: [place];  Service: Open Heart Surgery;  Laterality: N/A;  LIMA-LAD; SVG-DIAG; SVG-OM; SVG-RCA  . Tee without cardioversion N/A 11/03/2014    Procedure: TRANSESOPHAGEAL ECHOCARDIOGRAM (TEE);  Surgeon: Ivin Poot, MD;  Location: [place];  Service: Open Heart Surgery;  Laterality: N/A;     Current Outpatient Prescriptions  Medication Sig  Dispense Refill  . acetaminophen (TYLENOL) 500 MG tablet Take 1,000 mg by mouth 2 (two) times daily.    Marland Kitchen aspirin EC 81 MG tablet Take 81 mg by mouth daily.    . clopidogrel (PLAVIX) 75 MG tablet Take 1 tablet (75 mg total) by mouth daily. 30 tablet 3  . Flaxseed, Linseed, (FLAX SEED OIL) 1000 MG CAPS Take 1 capsule by mouth 2 (two) times daily.      . fluticasone (FLONASE) 50 MCG/ACT nasal spray Place 2 sprays into both nostrils daily as needed for allergies or rhinitis.    . furosemide (LASIX) 40 MG tablet Take 1 tablet (40 mg total) by mouth daily. For 7 Days 7 tablet 0  . isosorbide mononitrate (IMDUR) 60 MG 24 hr tablet Take 60 mg by mouth daily.     Marland Kitchen lisinopril (PRINIVIL,ZESTRIL) 5 MG tablet Take 1 tablet (5 mg total) by mouth daily. 30 tablet 3  . metoprolol tartrate (LOPRESSOR) 25 MG tablet Take 1 tablet (25 mg total) by mouth 2 (two) times daily. 60 tablet 3  . NITROSTAT 0.4 MG SL tablet Place 0.4 mg under the tongue as needed.     . potassium chloride SA (K-DUR,KLOR-CON) 20 MEQ tablet Take 1 tablet (20 mEq total) by mouth daily. For 7 Days 7 tablet 0  . pravastatin (PRAVACHOL) 20 MG tablet Take 1 tablet (20 mg total) by mouth every evening. 90 tablet 1   No current facility-administered medications for this visit.    Allergies:   Protamine; Statins; and Imodium    Social History:  The patient  reports that he has quit smoking. He has never used smokeless tobacco. He reports that he does not drink alcohol or use illicit drugs.   Family History:  The patient's family history includes COPD in his mother; Heart attack in his brother, father, and sister; Heart disease in his father and sister; Heart failure in his mother.    ROS:   Please see the history of present illness.   Review of Systems  Respiratory: Positive for cough.   All other systems reviewed and are negative.     PHYSICAL EXAM: VS:  BP 110/60 mmHg  Pulse 92  Ht 5\' 7"  (1.702 m)  Wt 182 lb (82.555 kg)  BMI  28.50 kg/m2    Wt Readings from Last 3 Encounters:  11/20/14 182 lb (82.555 kg)  11/09/14 186 lb 4.6 oz (84.5 kg)  10/27/14 193 lb 9.6 oz (87.816 kg)     GEN: Well nourished, well developed, in no acute distress HEENT: normal Neck: no JVD, no masses Cardiac:  Normal S1/S2, RRR; no murmur ,  no rubs or gallops, no edema   Chest:  Median sternotomy well healed without erythema or d/c Respiratory:  clear to auscultation bilaterally, no wheezing, rhonchi or rales. GI: soft, nontender, nondistended, + BS MS: no deformity or atrophy Skin: warm and dry  Neuro:  CNs II-XII intact, Strength and sensation are intact Psych: Normal affect   EKG:  EKG is ordered today.  It demonstrates:   NSR, HR 92, LAD, RBBB   Recent Labs: 01/19/2014: TSH 0.827 11/04/2014: Magnesium 2.2 11/08/2014: ALT 25; BUN 14; Creatinine 0.79; Hemoglobin 9.2*; Platelets 220; Potassium 3.7; Sodium 138    Lipid Panel    Component Value Date/Time   CHOL 169 02/28/2014 1117   TRIG 184.0* 02/28/2014 1117   HDL 36.80* 02/28/2014 1117   CHOLHDL 5 02/28/2014 1117   VLDL 36.8 02/28/2014 1117   [place] 95 02/28/2014 1117      ASSESSMENT AND PLAN:  Coronary artery disease S/P CABG (coronary artery bypass graft) He is progressing well since his CABG.  He sees Dr. Prescott Gum in 2-3 weeks.  He has already been contacted by Cardiac Rehab.  I have encouraged him to pursue this.  He is concerned about the cost.  I have asked him to increase his walking should he decide to forego formal rehab.  His HR could be better.  He has developed a cough that I think is from the ACE inhibitor.    -  Increase Metoprolol 37.5 mg bid   -  Decrease Imdur to 30 mg QD  -  Consider changing Lisinopril to Losartan if his cough continues.   -  Arrange FU Lipids. If LDL > 70, consider increasing Pravastatin to 40 mg QHS (he has a hx of statin intol).  Essential hypertension Controlled.  Adjust medications as noted.  Check BMET  today.  Hyperlipidemia Continue statin.  FU Lipids as noted above.   Hx of Subdural Hematoma This was traumatic and the neurosurgeon has released him.     Current medicines are reviewed at length with the patient today.  Concerns regarding medicines are as outlined above.  The following changes have been made:    Decrease Imdur to 30 mg QD  Increase Metoprolol Tartrate to 37.5 mg Twice daily    Labs/ tests ordered today include:   Orders Placed This Encounter  Procedures  . Basic Metabolic Panel (BMET)  . Lipid Profile  . Hepatic function panel  . EKG 12-Lead    Disposition:   FU with Dr. Lauree Chandler 6 weeks.    Signed, Versie Starks, MHS 11/20/2014 4:21 PM    [facility] [place], [place], [place]  [address] Phone: [phone]; Fax: [phone]

## 2014-11-21 LAB — BASIC METABOLIC PANEL
BUN: 21 mg/dL (ref 6–23)
CALCIUM: 9.9 mg/dL (ref 8.4–10.5)
CHLORIDE: 103 meq/L (ref 96–112)
CO2: 23 mEq/L (ref 19–32)
Creatinine, Ser: 0.97 mg/dL (ref 0.40–1.50)
GFR: 79.58 mL/min (ref >60.00)
Glucose, Bld: 122 mg/dL — ABNORMAL HIGH (ref 70–99)
POTASSIUM: 5.1 meq/L (ref 3.5–5.1)
Sodium: 135 mEq/L (ref 135–145)

## 2014-11-24 ENCOUNTER — Telehealth: Payer: Self-pay | Admitting: *Deleted

## 2014-11-24 DIAGNOSIS — I10 Essential (primary) hypertension: Secondary | ICD-10-CM

## 2014-11-24 NOTE — Telephone Encounter (Signed)
pt notified of lab results. When I asked pt if he was using any Na substitue or eating more K+ rich foods he said no. Pt then did tell me though he was taking K+ 20 meq daily. I stated that at D/C he was put on K+ 20 meq daily x 7 days then stop. Pt was then advised to stop the K+. I advised pt that once I d/w this with the PA he may want to repeat bmet in 1 week. I advised that I will d/w Brynda Rim. PA and cb later today or tomorrow with recommendations from [place] said ok and thank. He also confirmed his other med changes from his lat ov 5/12 with Nicki Reaper W. PA.

## 2014-11-24 NOTE — Telephone Encounter (Signed)
Stop K+ Check BMET 1 week Richardson Dopp, PA-C   11/24/2014 5:24 PM

## 2014-11-25 NOTE — Telephone Encounter (Signed)
lmptcb to advise per Brynda Rim. PA to have pt come in next week to recheck BMET since pt has been taking K+ from d/c longer than he was supposed to, Pt was supposed to stop K+ after 7 days from d/c . I will schedule lab for 5/27.

## 2014-11-27 ENCOUNTER — Telehealth (HOSPITAL_COMMUNITY): Payer: Self-pay | Admitting: Cardiac Rehabilitation

## 2014-11-27 NOTE — Telephone Encounter (Signed)
Pc to pt to enroll in cardiac rehab.  Per [employer] ref# [id number], no PCP referral, no deductible,  $45.00 copay per visit until $4950 OOP max met.  Currently, $1557 applied.  Pt unable to afford this copayment.  Pt offered cardiac maintenance program as alternative. Pt would like to talk to MD then decide. Pt would like call back in mid June.

## 2014-12-01 NOTE — Telephone Encounter (Signed)
Called pt today since I had nt heard back from my last call 5/17 about repeat BMET 5/27. I s/w pt's wife who said thank you and they will be here 12/05/14 for lab work.

## 2014-12-05 ENCOUNTER — Other Ambulatory Visit (INDEPENDENT_AMBULATORY_CARE_PROVIDER_SITE_OTHER): Payer: Medicare HMO | Admitting: *Deleted

## 2014-12-05 DIAGNOSIS — I10 Essential (primary) hypertension: Secondary | ICD-10-CM

## 2014-12-05 LAB — BASIC METABOLIC PANEL
BUN: 14 mg/dL (ref 6–23)
CALCIUM: 9 mg/dL (ref 8.4–10.5)
CHLORIDE: 109 meq/L (ref 96–112)
CO2: 26 mEq/L (ref 19–32)
Creatinine, Ser: 0.9 mg/dL (ref 0.40–1.50)
GFR: 86.75 mL/min (ref >60.00)
Glucose, Bld: 115 mg/dL — ABNORMAL HIGH (ref 70–99)
Potassium: 4.1 mEq/L (ref 3.5–5.1)
Sodium: 140 mEq/L (ref 135–145)

## 2014-12-16 ENCOUNTER — Other Ambulatory Visit: Payer: Self-pay | Admitting: Cardiothoracic Surgery

## 2014-12-16 DIAGNOSIS — Z951 Presence of aortocoronary bypass graft: Secondary | ICD-10-CM

## 2014-12-17 ENCOUNTER — Encounter: Payer: Self-pay | Admitting: Cardiothoracic Surgery

## 2014-12-17 ENCOUNTER — Ambulatory Visit (INDEPENDENT_AMBULATORY_CARE_PROVIDER_SITE_OTHER): Payer: Self-pay | Admitting: Cardiothoracic Surgery

## 2014-12-17 ENCOUNTER — Ambulatory Visit
Admission: RE | Admit: 2014-12-17 | Discharge: 2014-12-17 | Disposition: A | Discharge status: Home / Self Care | Payer: Medicare HMO | Source: Ambulatory Visit | Attending: Cardiothoracic Surgery | Admitting: Cardiothoracic Surgery

## 2014-12-17 VITALS — BP 136/77 | HR 67 | Resp 16 | Ht 67.0 in | Wt 182.0 lb

## 2014-12-17 DIAGNOSIS — Z951 Presence of aortocoronary bypass graft: Secondary | ICD-10-CM

## 2014-12-17 DIAGNOSIS — I25110 Atherosclerotic heart disease of native coronary artery with unstable angina pectoris: Secondary | ICD-10-CM

## 2014-12-17 NOTE — Progress Notes (Signed)
PCP is Eliezer Lofts, MD Referring Provider is Burnell Blanks*  Chief Complaint  Patient presents with  . Routine Post Op    s/p CABG X 4 11/03/14 with a cxr    HPI: The patient presents for final postop visit 2 months after urgent CABG x4. The patient is doing well without recurrent angina. The patient's insurance plan will not cover hospital based cardiac rehabilitation but he is in a home program. The patient knows not to lift more than 20 pounds until 3 months after surgery. He denies problems with surgical incisions. He is interested in discontinuing his imdur and flaxseed oil should not be a problem. He will continue his Plavix for previous drug-eluting stent per his cardiologist.  Past Medical History  Diagnosis Date  . Coronary atherosclerosis of unspecified type of vessel, native or graft     s/p stent mid LAD 2005  . DJD (degenerative joint disease)   . GERD (gastroesophageal reflux disease)   . DM (diabetes mellitus)   . HTN (hypertension)   . Hyperlipidemia     Past Surgical History  Procedure Laterality Date  . Ptca      Hx of it.   . Knee surgery    . Kidney stone removal    . Sigmoidoscopy    . Total knee arthroplasty  02/2011    Right knee  . Craniotomy N/A 01/18/2014    Procedure: CRANIOTOMY HEMATOMA EVACUATION SUBDURAL;  Surgeon: Hosie Spangle, MD;  Location: [place];  Service: Neurosurgery;  Laterality: N/A;  . Left heart catheterization with coronary angiogram N/A 07/27/2011    Procedure: LEFT HEART CATHETERIZATION WITH CORONARY ANGIOGRAM;  Surgeon: Burnell Blanks, MD;  Location: [facility] CATH LAB;  Service: Cardiovascular;  Laterality: N/A;  . Cardiac catheterization    . Brain surgery    . Left heart catheterization with coronary angiogram N/A 10/31/2014    Procedure: LEFT HEART CATHETERIZATION WITH CORONARY ANGIOGRAM;  Surgeon: Sherren Mocha, MD;  Location: [facility] CATH LAB;  Service: Cardiovascular;  Laterality: N/A;  . Coronary artery bypass graft  N/A 11/03/2014    Procedure: CORONARY ARTERY BYPASS GRAFTING (CABG), ON PUMP, TIMES FOUR, USING LEFT INTERNAL MAMMARY, RIGHT GREATER SAPHENOUS VEIN HARVESTED ENDOSCOPICALLY;  Surgeon: Ivin Poot, MD;  Location: [place];  Service: Open Heart Surgery;  Laterality: N/A;  LIMA-LAD; SVG-DIAG; SVG-OM; SVG-RCA  . Tee without cardioversion N/A 11/03/2014    Procedure: TRANSESOPHAGEAL ECHOCARDIOGRAM (TEE);  Surgeon: Ivin Poot, MD;  Location: [place];  Service: Open Heart Surgery;  Laterality: N/A;    Family History  Problem Relation Age of Onset  . Heart disease Father   . Heart failure Mother   . COPD Mother   . Heart disease Sister   . Heart attack Father   . Heart attack Sister   . Heart attack Brother     Social History History  Substance Use Topics  . Smoking status: Former Research scientist (life sciences)  . Smokeless tobacco: Never Used     Comment: quit in 1985. smoked 1ppd for 35 years   . Alcohol Use: No    Current Outpatient Prescriptions  Medication Sig Dispense Refill  . acetaminophen (TYLENOL) 500 MG tablet Take 1,000 mg by mouth 2 (two) times daily.    Marland Kitchen aspirin EC 81 MG tablet Take 81 mg by mouth daily.    . clopidogrel (PLAVIX) 75 MG tablet Take 1 tablet (75 mg total) by mouth daily. 30 tablet 3  . Flaxseed, Linseed, (FLAX SEED OIL) 1000 MG  CAPS Take 1 capsule by mouth 2 (two) times daily.      . fluticasone (FLONASE) 50 MCG/ACT nasal spray Place 2 sprays into both nostrils daily as needed for allergies or rhinitis.    Marland Kitchen isosorbide mononitrate (IMDUR) 60 MG 24 hr tablet Take 30 mg by mouth daily.    Marland Kitchen lisinopril (PRINIVIL,ZESTRIL) 5 MG tablet Take 1 tablet (5 mg total) by mouth daily. 30 tablet 3  . metoprolol tartrate (LOPRESSOR) 25 MG tablet Take 1.5 tablets (37.5 mg total) by mouth 2 (two) times daily. 90 tablet 6  . NITROSTAT 0.4 MG SL tablet Place 0.4 mg under the tongue as needed.     . pravastatin (PRAVACHOL) 20 MG tablet Take 1 tablet (20 mg total) by mouth every evening. 90 tablet 1    No current facility-administered medications for this visit.    Allergies  Allergen Reactions  . Protamine Rash    Hypotension, Rash, unstable vitals.   . Statins Other (See Comments)    Caused soreness (Lipitor, Zocor)  . Imodium [Loperamide] Itching and Rash    Review of Systems  Gaining strength and exercise tolerance No edema or shortness of breath No sternal popping or instability symptoms  BP 136/77 mmHg  Pulse 67  Resp 16  Ht 5\' 7"  (1.702 m)  Wt 182 lb (82.555 kg)  BMI 28.50 kg/m2  SpO2 98% Physical Exam Alert and comfortable Lungs clear Heart rhythm regular No edema  Diagnostic Tests: Chest x-ray clear  Impression: Continue current medications for his medical physicians. He was recommended 20-30 minutes of aerobic exercise 5 days a week and not to lift more than 20 pounds until 3 months after surgery.  Plan:return as needed  Len Childs, MD [facility] [phone]

## 2014-12-24 ENCOUNTER — Other Ambulatory Visit: Payer: Self-pay

## 2014-12-30 ENCOUNTER — Telehealth (HOSPITAL_COMMUNITY): Payer: Self-pay | Admitting: Cardiac Rehabilitation

## 2014-12-30 NOTE — Telephone Encounter (Signed)
pc to pt to discuss enrolling in cardiac rehab.  Pt declines due to high insurance copay $45/visit.  Dr. Angelena Form made aware

## 2014-12-31 NOTE — Progress Notes (Signed)
Chief Complaint  Patient presents with  . Coronary Artery Disease     History of Present Illness: 80 yo WM with history of CAD s/p placement of drug eluting stent in mid LAD in January of 2005 with residual disease in proximal LAD and RCA, HTN, Hyperlipidemia and GERD who presents today for cardiac follow up. I saw him 07/20/11 and he had c/o daily chest pains. Cardiac cath 07/27/11 showed stable moderate CAD with patent LAD stent. He has not tolerated statins in the past secondary to myalgias. He was evaluated by Truitt Merle, NP April 2016 for chest pain. Cardiac catheterization 10/31/14 with multivessel disease with severe 95% distal left main stenosis. He was admitted 4/22-11/09/14 and underwent CABG 4 (LIMA-LAD, SVG-DX, SVG-PDA, SVG-OM) per Dr. Prescott Gum. Postoperative course was notable for frequent PACs. He is placed on amiodarone but this was discontinued secondary to significant nausea. He otherwise remained in sinus rhythm. Otherwise, his postoperative course was fairly uneventful.  He is here today for follow up. No chest pain. He is feeling much better. No dyspnea. He is tolerating his medications.   Primary Care Physician: Eliezer Lofts   Past Medical History  Diagnosis Date  . Coronary atherosclerosis of unspecified type of vessel, native or graft     s/p stent mid LAD 2005  . DJD (degenerative joint disease)   . GERD (gastroesophageal reflux disease)   . DM (diabetes mellitus)   . HTN (hypertension)   . Hyperlipidemia     Past Surgical History  Procedure Laterality Date  . Ptca      Hx of it.   . Knee surgery    . Kidney stone removal    . Sigmoidoscopy    . Total knee arthroplasty  02/2011    Right knee  . Craniotomy N/A 01/18/2014    Procedure: CRANIOTOMY HEMATOMA EVACUATION SUBDURAL;  Surgeon: Hosie Spangle, MD;  Location: [place];  Service: Neurosurgery;  Laterality: N/A;  . Left heart catheterization with coronary angiogram N/A 07/27/2011    Procedure: LEFT  HEART CATHETERIZATION WITH CORONARY ANGIOGRAM;  Surgeon: Burnell Blanks, MD;  Location: [facility] CATH LAB;  Service: Cardiovascular;  Laterality: N/A;  . Cardiac catheterization    . Brain surgery    . Left heart catheterization with coronary angiogram N/A 10/31/2014    Procedure: LEFT HEART CATHETERIZATION WITH CORONARY ANGIOGRAM;  Surgeon: Sherren Mocha, MD;  Location: [facility] CATH LAB;  Service: Cardiovascular;  Laterality: N/A;  . Coronary artery bypass graft N/A 11/03/2014    Procedure: CORONARY ARTERY BYPASS GRAFTING (CABG), ON PUMP, TIMES FOUR, USING LEFT INTERNAL MAMMARY, RIGHT GREATER SAPHENOUS VEIN HARVESTED ENDOSCOPICALLY;  Surgeon: Ivin Poot, MD;  Location: [place];  Service: Open Heart Surgery;  Laterality: N/A;  LIMA-LAD; SVG-DIAG; SVG-OM; SVG-RCA  . Tee without cardioversion N/A 11/03/2014    Procedure: TRANSESOPHAGEAL ECHOCARDIOGRAM (TEE);  Surgeon: Ivin Poot, MD;  Location: [place];  Service: Open Heart Surgery;  Laterality: N/A;    Current Outpatient Prescriptions  Medication Sig Dispense Refill  . acetaminophen (TYLENOL) 500 MG tablet Take 1,000 mg by mouth 2 (two) times daily.    Marland Kitchen aspirin EC 81 MG tablet Take 81 mg by mouth daily.    . clopidogrel (PLAVIX) 75 MG tablet Take 1 tablet (75 mg total) by mouth daily. 30 tablet 3  . Flaxseed, Linseed, (FLAX SEED OIL) 1000 MG CAPS Take 1 capsule by mouth 2 (two) times daily.      . fluticasone (FLONASE) 50 MCG/ACT  nasal spray Place 2 sprays into both nostrils daily as needed for allergies or rhinitis.    . furosemide (LASIX) 40 MG tablet Take 40 mg by mouth daily.    . isosorbide mononitrate (IMDUR) 60 MG 24 hr tablet Take 30 mg by mouth daily.    Marland Kitchen lisinopril (PRINIVIL,ZESTRIL) 5 MG tablet Take 1 tablet (5 mg total) by mouth daily. 30 tablet 3  . metoprolol tartrate (LOPRESSOR) 25 MG tablet Take 1.5 tablets (37.5 mg total) by mouth 2 (two) times daily. 90 tablet 6  . NITROSTAT 0.4 MG SL tablet Place 0.4 mg under the tongue as  needed (chest pain up to 3 doses).     . pravastatin (PRAVACHOL) 20 MG tablet Take 1 tablet (20 mg total) by mouth every evening. 90 tablet 1   No current facility-administered medications for this visit.    Allergies  Allergen Reactions  . Protamine Rash    Hypotension, Rash, unstable vitals.   . Statins Other (See Comments)    Caused soreness (Lipitor, Zocor)  . Imodium [Loperamide] Itching and Rash    History   Social History  . Marital Status: Married    Spouse Name: N/A  . Number of Children: N/A  . Years of Education: N/A   Occupational History  . Not on file.   Social History Main Topics  . Smoking status: Former Research scientist (life sciences)  . Smokeless tobacco: Never Used     Comment: quit in 1985. smoked 1ppd for 35 years   . Alcohol Use: No  . Drug Use: No  . Sexual Activity: Yes   Other Topics Concern  . Not on file   Social History Narrative   Married with children. 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 10:20 am    Has living will,  HCPOA: jerrie Tipps, full code (reviewed 54)    Family History  Problem Relation Age of Onset  . Heart disease Father   . Heart failure Mother   . COPD Mother   . Heart disease Sister   . Heart attack Father   . Heart attack Sister   . Heart attack Brother     Review of Systems:  As stated in the HPI and otherwise negative.   BP 134/70 mmHg  Pulse 61  Ht 5\' 7"  (1.702 m)  Wt 188 lb 1.9 oz (85.331 kg)  BMI 29.46 kg/m2  Physical Examination: General: Well developed, well nourished, NAD HEENT: OP clear, mucus membranes moist SKIN: warm, dry. No rashes. Neuro: No focal deficits Musculoskeletal: Muscle strength 5/5 all ext Psychiatric: Mood and affect normal Neck: No JVD, no carotid bruits, no thyromegaly, no lymphadenopathy. Lungs:Clear bilaterally, no wheezes, rhonci, crackles Cardiovascular: Regular rate and rhythm. No murmurs, gallops or rubs. Abdomen:Soft. Bowel sounds present.  Non-tender.  Extremities: No lower extremity edema. Pulses are 2 + in the bilateral DP/PT.  EKG:  EKG is not ordered today. The ekg ordered today demonstrates   Recent Labs: 01/19/2014: TSH 0.827 11/04/2014: Magnesium 2.2 11/08/2014: ALT 25; Hemoglobin 9.2*; Platelets 220 12/05/2014: BUN 14; Creatinine, Ser 0.90; Potassium 4.1; Sodium 140   Lipid Panel    Component Value Date/Time   CHOL 169 02/28/2014 1117   TRIG 184.0* 02/28/2014 1117   HDL 36.80* 02/28/2014 1117   CHOLHDL 5 02/28/2014 1117   VLDL 36.8 02/28/2014 1117   LDLCALC 95 02/28/2014 1117     Wt Readings from Last 3 Encounters:  01/01/15 188 lb 1.9 oz (85.331 kg)  12/17/14  182 lb (82.555 kg)  11/20/14 182 lb (82.555 kg)     Other studies Reviewed: Additional studies/ records that were reviewed today include: . Review of the above records demonstrates:    Assessment and Plan:   1. Coronary artery disease:  S/P CABG 4 V April 2016. Stable. Continue beta blocker, Ace inh, statin, Plavix. Stop ASA with easy bruising.   2. Essential hypertension: Controlled. No changes.   3. Hyperlipidemia: Continue statin. Low dose with statin intolerance in the past.   4. Hx of Subdural Hematoma: This was traumatic and the neurosurgeon has released him.   Current medicines are reviewed at length with the patient today.  The patient does not have concerns regarding medicines.  The following changes have been made:  no change  Labs/ tests ordered today include:  No orders of the defined types were placed in this encounter.    Disposition:   FU with me in 6 months  Signed, Lauree Chandler, MD 01/01/2015 11:54 AM    [facility] [place], [place], [place]  [address] Phone: [phone]; Fax: [phone]

## 2015-01-01 ENCOUNTER — Encounter: Payer: Self-pay | Admitting: Cardiovascular Disease

## 2015-01-01 ENCOUNTER — Ambulatory Visit (INDEPENDENT_AMBULATORY_CARE_PROVIDER_SITE_OTHER): Payer: Medicare HMO | Admitting: Cardiovascular Disease

## 2015-01-01 VITALS — BP 134/70 | HR 61 | Ht 67.0 in | Wt 188.1 lb

## 2015-01-01 DIAGNOSIS — I10 Essential (primary) hypertension: Secondary | ICD-10-CM

## 2015-01-01 DIAGNOSIS — E785 Hyperlipidemia, unspecified: Secondary | ICD-10-CM

## 2015-01-01 DIAGNOSIS — I2510 Atherosclerotic heart disease of native coronary artery without angina pectoris: Secondary | ICD-10-CM

## 2015-01-01 NOTE — Patient Instructions (Signed)
Medication Instructions:  Your physician recommends that you continue on your current medications as directed. Please refer to the Current Medication list given to you today.   Labwork: none  Testing/Procedures: none  Follow-Up: Your physician wants you to follow-up in: 6 months.  You will receive a reminder letter in the mail two months in advance. If you don't receive a letter, please call our office to schedule the follow-up appointment.       

## 2015-03-03 ENCOUNTER — Other Ambulatory Visit: Payer: Self-pay | Admitting: Cardiovascular Disease

## 2015-04-28 DIAGNOSIS — D485 Neoplasm of uncertain behavior of skin: Secondary | ICD-10-CM | POA: Diagnosis not present

## 2015-04-28 DIAGNOSIS — C44329 Squamous cell carcinoma of skin of other parts of face: Secondary | ICD-10-CM | POA: Diagnosis not present

## 2015-04-28 DIAGNOSIS — Z85828 Personal history of other malignant neoplasm of skin: Secondary | ICD-10-CM | POA: Diagnosis not present

## 2015-06-09 ENCOUNTER — Ambulatory Visit (INDEPENDENT_AMBULATORY_CARE_PROVIDER_SITE_OTHER): Payer: Medicare HMO | Admitting: Cardiovascular Disease

## 2015-06-09 ENCOUNTER — Encounter: Payer: Self-pay | Admitting: Cardiovascular Disease

## 2015-06-09 VITALS — BP 126/72 | HR 68 | Ht 67.0 in | Wt 192.0 lb

## 2015-06-09 DIAGNOSIS — I2510 Atherosclerotic heart disease of native coronary artery without angina pectoris: Secondary | ICD-10-CM

## 2015-06-09 DIAGNOSIS — I10 Essential (primary) hypertension: Secondary | ICD-10-CM | POA: Diagnosis not present

## 2015-06-09 DIAGNOSIS — E785 Hyperlipidemia, unspecified: Secondary | ICD-10-CM

## 2015-06-09 NOTE — Progress Notes (Signed)
Chief Complaint  Patient presents with  . Shortness of Breath     History of Present Illness: 80 yo WM with history of CAD s/p CABG 2016, prior coronary stent placement before his CABG, HTN, Hyperlipidemia and GERD who presents today for cardiac follow up. He has been followed for CAD since 2005 when he had a LAD stent placed. He has not tolerated statins in the past secondary to myalgias. He was evaluated by Truitt Merle, NP April 2016 for chest pain. Cardiac catheterization 10/31/14 with multivessel disease with severe 95% distal left main stenosis. He was admitted 4/22-11/09/14 and underwent CABG 4 (LIMA-LAD, SVG-DX, SVG-PDA, SVG-OM) per Dr. Prescott Gum. Postoperative course was notable for frequent PACs. He was placed on amiodarone but this was discontinued secondary to significant nausea. He otherwise remained in sinus rhythm. Otherwise, his postoperative course was fairly uneventful.  He is here today for follow up. No chest pain. He has dyspnea with moderate exertion. He is tolerating his medications.   Primary Care Physician: Eliezer Lofts   Past Medical History  Diagnosis Date  . Coronary atherosclerosis of unspecified type of vessel, native or graft     s/p stent mid LAD 2005  . DJD (degenerative joint disease)   . GERD (gastroesophageal reflux disease)   . DM (diabetes mellitus) ([place])   . HTN (hypertension)   . Hyperlipidemia     Past Surgical History  Procedure Laterality Date  . Ptca      Hx of it.   . Knee surgery    . Kidney stone removal    . Sigmoidoscopy    . Total knee arthroplasty  02/2011    Right knee  . Craniotomy N/A 01/18/2014    Procedure: CRANIOTOMY HEMATOMA EVACUATION SUBDURAL;  Surgeon: Hosie Spangle, MD;  Location: [place];  Service: Neurosurgery;  Laterality: N/A;  . Left heart catheterization with coronary angiogram N/A 07/27/2011    Procedure: LEFT HEART CATHETERIZATION WITH CORONARY ANGIOGRAM;  Surgeon: Burnell Blanks, MD;  Location: [facility]  CATH LAB;  Service: Cardiovascular;  Laterality: N/A;  . Cardiac catheterization    . Brain surgery    . Left heart catheterization with coronary angiogram N/A 10/31/2014    Procedure: LEFT HEART CATHETERIZATION WITH CORONARY ANGIOGRAM;  Surgeon: Sherren Mocha, MD;  Location: [facility] CATH LAB;  Service: Cardiovascular;  Laterality: N/A;  . Coronary artery bypass graft N/A 11/03/2014    Procedure: CORONARY ARTERY BYPASS GRAFTING (CABG), ON PUMP, TIMES FOUR, USING LEFT INTERNAL MAMMARY, RIGHT GREATER SAPHENOUS VEIN HARVESTED ENDOSCOPICALLY;  Surgeon: Ivin Poot, MD;  Location: [place];  Service: Open Heart Surgery;  Laterality: N/A;  LIMA-LAD; SVG-DIAG; SVG-OM; SVG-RCA  . Tee without cardioversion N/A 11/03/2014    Procedure: TRANSESOPHAGEAL ECHOCARDIOGRAM (TEE);  Surgeon: Ivin Poot, MD;  Location: [place];  Service: Open Heart Surgery;  Laterality: N/A;    Current Outpatient Prescriptions  Medication Sig Dispense Refill  . acetaminophen (TYLENOL) 500 MG tablet Take 1,000 mg by mouth 2 (two) times daily as needed.     . clopidogrel (PLAVIX) 75 MG tablet Take 1 tablet (75 mg total) by mouth daily. 30 tablet 3  . lisinopril (PRINIVIL,ZESTRIL) 5 MG tablet Take 1 tablet (5 mg total) by mouth daily. 30 tablet 3  . metoprolol tartrate (LOPRESSOR) 25 MG tablet Take 1.5 tablets (37.5 mg total) by mouth 2 (two) times daily. 90 tablet 6   No current facility-administered medications for this visit.    Allergies  Allergen Reactions  .  Protamine Rash    Hypotension, Rash, unstable vitals.   . Statins Other (See Comments)    Caused soreness (Lipitor, Zocor)  . Imodium [Loperamide] Itching and Rash    Social History   Social History  . Marital Status: Married    Spouse Name: N/A  . Number of Children: N/A  . Years of Education: N/A   Occupational History  . Not on file.   Social History Main Topics  . Smoking status: Former Research scientist (life sciences)  . Smokeless tobacco: Never Used     Comment: quit in  1985. smoked 1ppd for 35 years   . Alcohol Use: No  . Drug Use: No  . Sexual Activity: Yes   Other Topics Concern  . 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 Heart disease Father   . Heart failure Mother   . COPD Mother   . Heart disease Sister   . Heart attack Father   . Heart attack Sister   . Heart attack Brother     Review of Systems:  As stated in the HPI and otherwise negative.   BP 126/72 mmHg  Pulse 68  Ht 5\' 7"  (1.702 m)  Wt 192 lb (87.091 kg)  BMI 30.06 kg/m2  Physical Examination: General: Well developed, well nourished, NAD HEENT: OP clear, mucus membranes moist SKIN: warm, dry. No rashes. Neuro: No focal deficits Musculoskeletal: Muscle strength 5/5 all ext Psychiatric: Mood and affect normal Neck: No JVD, no carotid bruits, no thyromegaly, no lymphadenopathy. Lungs:Clear bilaterally, no wheezes, rhonci, crackles Cardiovascular: Regular rate and rhythm. No murmurs, gallops or rubs. Abdomen:Soft. Bowel sounds present. Non-tender.  Extremities: No lower extremity edema. Pulses are 2 + in the bilateral DP/PT.  EKG:  EKG is not ordered today. The ekg ordered today demonstrates   Recent Labs: 11/04/2014: Magnesium 2.2 11/08/2014: ALT 25; Hemoglobin 9.2*; Platelets 220 12/05/2014: BUN 14; Creatinine, Ser 0.90; Potassium 4.1; Sodium 140   Lipid Panel    Component Value Date/Time   CHOL 169 02/28/2014 1117   TRIG 184.0* 02/28/2014 1117   HDL 36.80* 02/28/2014 1117   CHOLHDL 5 02/28/2014 1117   VLDL 36.8 02/28/2014 1117   LDLCALC 95 02/28/2014 1117     Wt Readings from Last 3 Encounters:  06/09/15 192 lb (87.091 kg)  01/01/15 188 lb 1.9 oz (85.331 kg)  12/17/14 182 lb (82.555 kg)     Other studies  Reviewed: Additional studies/ records that were reviewed today include: . Review of the above records demonstrates:    Assessment and Plan:   1. Coronary artery disease:  S/P CABG 4 V April 2016. Stable. Continue beta blocker, Ace inh, Plavix. ASA stopped due to bruising.   2. Essential hypertension: Controlled. No changes.   3. Hyperlipidemia: He is not willing to restart statin therapy due to muscle aches. We discussed PCSK9 inh but he does not wish to consider at this time.    4. Hx of Subdural Hematoma: This was traumatic and the neurosurgeon has released him.   Current medicines are reviewed at length with the patient today.  The patient does not have concerns regarding medicines.  The following changes have been made:  no change  Labs/ tests ordered today include:  No orders  of the defined types were placed in this encounter.    Disposition:   FU with me in 6 months  Signed, Lauree Chandler, MD 06/09/2015 1:15 PM    [facility] [address], [place], [place]  [address] Phone: [phone]; Fax: [phone]

## 2015-06-09 NOTE — Patient Instructions (Addendum)

## 2015-07-13 ENCOUNTER — Other Ambulatory Visit: Payer: Self-pay | Admitting: Physician Assistant

## 2015-07-13 ENCOUNTER — Other Ambulatory Visit: Payer: Self-pay | Admitting: Cardiovascular Disease

## 2015-08-28 DIAGNOSIS — Z85828 Personal history of other malignant neoplasm of skin: Secondary | ICD-10-CM | POA: Diagnosis not present

## 2015-08-28 DIAGNOSIS — D485 Neoplasm of uncertain behavior of skin: Secondary | ICD-10-CM | POA: Diagnosis not present

## 2015-08-28 DIAGNOSIS — L821 Other seborrheic keratosis: Secondary | ICD-10-CM | POA: Diagnosis not present

## 2015-08-28 DIAGNOSIS — L570 Actinic keratosis: Secondary | ICD-10-CM | POA: Diagnosis not present

## 2015-10-09 ENCOUNTER — Telehealth: Payer: Self-pay | Admitting: Cardiovascular Disease

## 2015-10-09 NOTE — Telephone Encounter (Signed)
Calling stating he has noticed he has been more SOB when he walks up a hill and also has some chest pressure. States "haven't felt right since surgery last year but the SOB has been worse recently".  BP has been elevated was 178/90 HR 91 this AM and about 30 min ago was 140/88 HR 70's.  He does have a BP cuff not wrist unit. States he just doesn't feel good and wants to be seen.  Reviewed his diet and he states he "probably doesn't eat right".  Tries to watch his salt intake but admits to eating some frozen meals.  Put him on schedule to see Desiree Hane on Monday at 2:00-can't come in the morning.  He will bring his medications with him. Verbalizes understanding that if he starts having Chest pain or SOB becomes worse to go to ER.

## 2015-10-09 NOTE — Telephone Encounter (Signed)
°  New Prob   Pt has some questions regarding recent medication changes. Please call.

## 2015-10-12 ENCOUNTER — Encounter: Payer: Self-pay | Admitting: Physician Assistant

## 2015-10-12 ENCOUNTER — Ambulatory Visit (INDEPENDENT_AMBULATORY_CARE_PROVIDER_SITE_OTHER): Payer: Medicare HMO | Admitting: Physician Assistant

## 2015-10-12 VITALS — BP 140/80 | HR 67 | Ht 67.0 in | Wt 191.4 lb

## 2015-10-12 DIAGNOSIS — I2510 Atherosclerotic heart disease of native coronary artery without angina pectoris: Secondary | ICD-10-CM

## 2015-10-12 DIAGNOSIS — I208 Other forms of angina pectoris: Secondary | ICD-10-CM

## 2015-10-12 DIAGNOSIS — R0609 Other forms of dyspnea: Secondary | ICD-10-CM

## 2015-10-12 DIAGNOSIS — I10 Essential (primary) hypertension: Secondary | ICD-10-CM | POA: Diagnosis not present

## 2015-10-12 DIAGNOSIS — R0600 Dyspnea, unspecified: Secondary | ICD-10-CM

## 2015-10-12 MED ORDER — PANTOPRAZOLE SODIUM 40 MG PO TBEC: 40.0000 mg | DELAYED_RELEASE_TABLET | Freq: Every day | ORAL | Status: DC

## 2015-10-12 MED ORDER — ISOSORBIDE MONONITRATE ER 30 MG PO TB24: 30.0000 mg | ORAL_TABLET | Freq: Every day | ORAL | Status: DC

## 2015-10-12 NOTE — Progress Notes (Signed)
Cardiology Office Note   Date:  10/12/2015   ID:  [name], DOB [birth year], MRN [id number]  PCP:  Eliezer Lofts, MD  Cardiologist:  Dr. Angelena Form  Chief Complaint  Patient presents with  . Follow-up    seen for Dr. Angelena Form      History of Present Illness: [name] is a 81 y.o. male who presents for cardiology visit. Patient has a history of CAD s/p CABG 2016, HTN, HLD, history of traumatic subdural hematoma 01/2014, and GERD. He has been followed for CAD since 2005 when he had an LAD stent placed. He has not tolerated statins in the past secondary to myalgia. Cardiac catheterization on 10/31/2014 showed multivessel CAD with severe 95% distal left main stenosis. He underwent a four-vessel CABG with LIMA to LAD, SVG to diagonal, SVG to PDA and SVG to OM by Dr. Prescott Gum in April 2016. Postoperative course was notable for frequent PACs. He was placed on amiodarone was discontinued secondary to significant nausea.  He was last follow-up in the office on 06/09/2015, given his history of CAD, Dr. Angelena Form has discussed with the patient regarding PCSK 9 inhibitor, however he did not wish to consider at this time. He is unwilling to restart statin therapy due to muscle aches. His aspirin has been discontinued due to bruising. He presented to cardiology office today for evaluation of worsening dyspnea. According to the patient, he has had dyspnea since CABG last year. It is also accompanied by chest pressure. He says this degree of chest pressure was not as acute as when he felt prior to the CABG. Looking back, it does not appear there is a period of time after CABG that he did not have this symptom. Given the worsening shortness of breath, he has also discussed with Dr. Angelena Form in the past, he decided to come to the cardiology office for further evaluation. It appears a many occur with exertion and rarely occur at rest as well.    Past Medical History  Diagnosis Date  . Coronary  atherosclerosis of unspecified type of vessel, native or graft     s/p stent mid LAD 2005  . DJD (degenerative joint disease)   . GERD (gastroesophageal reflux disease)   . DM (diabetes mellitus) ([place])   . HTN (hypertension)   . Hyperlipidemia     Past Surgical History  Procedure Laterality Date  . Ptca      Hx of it.   . Knee surgery    . Kidney stone removal    . Sigmoidoscopy    . Total knee arthroplasty  02/2011    Right knee  . Craniotomy N/A 01/18/2014    Procedure: CRANIOTOMY HEMATOMA EVACUATION SUBDURAL;  Surgeon: Hosie Spangle, MD;  Location: [place];  Service: Neurosurgery;  Laterality: N/A;  . Left heart catheterization with coronary angiogram N/A 07/27/2011    Procedure: LEFT HEART CATHETERIZATION WITH CORONARY ANGIOGRAM;  Surgeon: Burnell Blanks, MD;  Location: [facility] CATH LAB;  Service: Cardiovascular;  Laterality: N/A;  . Cardiac catheterization    . Brain surgery    . Left heart catheterization with coronary angiogram N/A 10/31/2014    Procedure: LEFT HEART CATHETERIZATION WITH CORONARY ANGIOGRAM;  Surgeon: Sherren Mocha, MD;  Location: [facility] CATH LAB;  Service: Cardiovascular;  Laterality: N/A;  . Coronary artery bypass graft N/A 11/03/2014    Procedure: CORONARY ARTERY BYPASS GRAFTING (CABG), ON PUMP, TIMES FOUR, USING LEFT INTERNAL MAMMARY, RIGHT GREATER SAPHENOUS VEIN HARVESTED ENDOSCOPICALLY;  Surgeon:  Ivin Poot, MD;  Location: [place];  Service: Open Heart Surgery;  Laterality: N/A;  LIMA-LAD; SVG-DIAG; SVG-OM; SVG-RCA  . Tee without cardioversion N/A 11/03/2014    Procedure: TRANSESOPHAGEAL ECHOCARDIOGRAM (TEE);  Surgeon: Ivin Poot, MD;  Location: [place];  Service: Open Heart Surgery;  Laterality: N/A;     Current Outpatient Prescriptions  Medication Sig Dispense Refill  . acetaminophen (TYLENOL) 500 MG tablet Take 1,000 mg by mouth 2 (two) times daily as needed for moderate pain.     Marland Kitchen clopidogrel (PLAVIX) 75 MG tablet TAKE 1 TABLET BY MOUTH DAILY 90  tablet 1  . fluorouracil (EFUDEX) 5 % cream     . lisinopril (PRINIVIL,ZESTRIL) 5 MG tablet TAKE 1 TABLET BY MOUTH DAILY 90 tablet 1  . metoprolol tartrate (LOPRESSOR) 25 MG tablet TAKE ONE AND ONE-HALF TABLETS BY MOUTH TWICE A DAY 90 tablet 10  . Multiple Vitamin (MULTIVITAMIN) tablet Take 1 tablet by mouth daily. CENTRUM SILVER    . Omeprazole Magnesium 20.6 (20 Base) MG CPDR Take 1 tablet by mouth daily.    . isosorbide mononitrate (IMDUR) 30 MG 24 hr tablet Take 1 tablet (30 mg total) by mouth daily. 30 tablet 6  . pantoprazole (PROTONIX) 40 MG tablet Take 1 tablet (40 mg total) by mouth daily. 30 tablet 11   No current facility-administered medications for this visit.    Allergies:   Protamine; Statins; and Imodium    Social History:  The patient  reports that he has quit smoking. He has never used smokeless tobacco. He reports that he does not drink alcohol or use illicit drugs.   Family History:  The patient's family history includes COPD in his mother; Heart attack in his brother, father, and sister; Heart disease in his father and sister; Heart failure in his mother.    ROS:  Please see the history of present illness.   Otherwise, review of systems are positive for Dyspnea on exertion, chest pressure.   All other systems are reviewed and negative.    PHYSICAL EXAM: VS:  BP 140/80 mmHg  Pulse 67  Ht 5\' 7"  (1.702 m)  Wt 191 lb 6.4 oz (86.818 kg)  BMI 29.97 kg/m2 , BMI Body mass index is 29.97 kg/(m^2). GEN: Well nourished, well developed, in no acute distress HEENT: normal Neck: no JVD, carotid bruits, or masses Cardiac: RRR; no murmurs, rubs, or gallops,no edema  Respiratory:  clear to auscultation bilaterally, normal work of breathing GI: soft, nontender, nondistended, + BS MS: no deformity or atrophy Skin: warm and dry, no rash Neuro:  Strength and sensation are intact Psych: euthymic mood, full affect   EKG:  EKG is ordered today. The ekg ordered today  demonstrates normal sinus rhythm without significant ST-T wave changes, right bundle branch block   Recent Labs: 11/04/2014: Magnesium 2.2 11/08/2014: ALT 25; Hemoglobin 9.2*; Platelets 220 12/05/2014: BUN 14; Creatinine, Ser 0.90; Potassium 4.1; Sodium 140    Lipid Panel    Component Value Date/Time   CHOL 169 02/28/2014 1117   TRIG 184.0* 02/28/2014 1117   HDL 36.80* 02/28/2014 1117   CHOLHDL 5 02/28/2014 1117   VLDL 36.8 02/28/2014 1117   LDLCALC 95 02/28/2014 1117      Wt Readings from Last 3 Encounters:  10/12/15 191 lb 6.4 oz (86.818 kg)  06/09/15 192 lb (87.091 kg)  01/01/15 188 lb 1.9 oz (85.331 kg)      Other studies Reviewed: Additional studies/ records that were reviewed today include:  CABG 11/03/2014 PRE-OPERATIVE DIAGNOSIS: CAD  POST-OPERATIVE DIAGNOSIS: CAD  PROCEDURE: Procedure(s): CORONARY ARTERY BYPASS GRAFTING (CABG), ON PUMP, TIMES FOUR, USING LEFT INTERNAL MAMMARY, RIGHT GREATER SAPHENOUS VEIN HARVESTED ENDOSCOPICALLY (LIMA-LAD; SVG-DIAG; SVG-OM; SVG-RCA) TRANSESOPHAGEAL ECHOCARDIOGRAM (TEE)   Cardiac cath 10/31/2014 Final Conclusions:  1. Severe distal left main stenosis 2. Patent LAD stent with severe diffuse mid and distal LAD stenosis 3. Diffuse nonobstructive left circumflex and RCA stenoses 4. Normal LV systolic function  Recommendations: Hospital admission to a cardiac stepdown bed, IV heparin, cardiac surgery consultation. The patient has high risk coronary anatomy with severe distal left main stem stenosis. He has not had any resting anginal symptoms, that clearly his anatomy warrants at hospital admission, IV heparin for anticoagulation, and revascularization while he is in-house.   Review of the above records demonstrates:   Patient underwent four-vessel CABG last year, has been having dyspnea since, presented today for cardiology evaluation for dyspnea on exertion.   ASSESSMENT AND PLAN:  1.  Chronic dyspnea on exertion:  -  The fact that he has persistent symptom after CABG, make me think the chance that his graft is down on is on the lower side. There does not seems to be any obvious sudden change in his symptom, appears more of a gradual decline with dyspnea on exertion. I want to hold off on any ischemic workup at this time. I have placed him on Imdur 30 mg daily hopefully will improve blood flow and decrease chest discomfort. I will repeat echocardiogram, this will be the first echocardiogram after CABG to make sure his EF is still stable. If there is any drop in the EF, we'll plan for outpatient Myoview  2. CAD s/p CABG 2016 with LIMA to LAD, SVG to diagonal, SVG to PDA and SVG to OM by Dr. Prescott Gum in April 2016  3. HTN: Blood pressure mildly elevated on lisinopril 5 mg and 37.5 mg twice a day of metoprolol, will add 30 mg Imdur daily  4. HLD: Intolerant to statins, previously discussed PCSK 9 inhibitor, patient was not interested  5. history of traumatic subdural hematoma 01/2014: He was taken off aspirin currently on Plavix only  6. GERD: Will change his Prilosec to Protonix to decrease interaction with Plavix.     Current medicines are reviewed at length with the patient today.  The patient does not have concerns regarding medicines.  The following changes have been made:  Add imdur  Labs/ tests ordered today include:   Orders Placed This Encounter  Procedures  . EKG 12-Lead  . Echocardiogram     Disposition:   FU with Dr. Angelena Form in 2 months  Signed, Almyra Deforest, [place]  10/12/2015 5:54 PM    [facility] [address], [place], [place]  [address] Phone: [phone]; Fax: [phone]

## 2015-10-12 NOTE — Patient Instructions (Addendum)
Medication Instructions:   STOP PRILOSEC  START TAKING PROTONIX 40 MG ONCE A DAY   START TAKING IMDUR 30 MG ONCE A DAY    If you need a refill on your cardiac medications before your next appointment, please call your pharmacy.  Labwork: NONE ORDER TODAY    Testing/Procedures:  Your physician has requested that you have an echocardiogram. Echocardiography is a painless test that uses sound waves to create images of your heart. It provides your doctor with information about the size and shape of your heart and how well your heart's chambers and valves are working. This procedure takes approximately one hour. There are no restrictions for this procedure.     Follow-Up: 2 TO 3 MONTHS WITH DR Angelena Form    Any Other Special Instructions Will Be Listed Below (If Applicable).

## 2015-10-15 ENCOUNTER — Ambulatory Visit (HOSPITAL_COMMUNITY)
Admission: RE | Admit: 2015-10-15 | Discharge: 2015-10-15 | Disposition: A | Discharge status: Home / Self Care | Payer: Medicare HMO | Source: Ambulatory Visit | Attending: Physician Assistant | Admitting: Physician Assistant

## 2015-10-15 DIAGNOSIS — R0600 Dyspnea, unspecified: Secondary | ICD-10-CM

## 2015-10-15 DIAGNOSIS — I2510 Atherosclerotic heart disease of native coronary artery without angina pectoris: Secondary | ICD-10-CM | POA: Diagnosis not present

## 2015-10-15 DIAGNOSIS — R0609 Other forms of dyspnea: Secondary | ICD-10-CM | POA: Insufficient documentation

## 2015-10-15 DIAGNOSIS — Z951 Presence of aortocoronary bypass graft: Secondary | ICD-10-CM | POA: Diagnosis not present

## 2015-10-15 DIAGNOSIS — I10 Essential (primary) hypertension: Secondary | ICD-10-CM | POA: Insufficient documentation

## 2015-10-15 DIAGNOSIS — E785 Hyperlipidemia, unspecified: Secondary | ICD-10-CM | POA: Diagnosis not present

## 2015-10-15 DIAGNOSIS — K219 Gastro-esophageal reflux disease without esophagitis: Secondary | ICD-10-CM | POA: Insufficient documentation

## 2015-10-16 ENCOUNTER — Telehealth: Payer: Self-pay | Admitting: Cardiovascular Disease

## 2015-10-16 NOTE — Telephone Encounter (Signed)
New Message:   Pt is calling in to see if his Echo results are available.

## 2015-10-16 NOTE — Telephone Encounter (Signed)
Study was ordered by Almyra Deforest, PA and done at [facility].  I spoke with pt and told him study had not yet been reviewed by provider and we would call him when results available.

## 2015-10-23 NOTE — Telephone Encounter (Signed)
Pt was advised of his echo resutls 10/20/15 Called pt and spoke with pt and wife just to be sure.  Pt's wife stated that she wasn't sure why pt called.

## 2015-12-28 ENCOUNTER — Telehealth: Payer: Self-pay

## 2015-12-28 NOTE — Telephone Encounter (Signed)
PLEASE NOTE: All timestamps contained within this report are represented as [place] Standard Time. CONFIDENTIALTY NOTICE: This fax transmission is intended only for the addressee. It contains information that is legally privileged, confidential or otherwise protected from use or disclosure. If you are not the intended recipient, you are strictly prohibited from reviewing, disclosing, copying using or disseminating any of this information or taking any action in reliance on or regarding this information. If you have received this fax in error, please notify us immediately by telephone so that we can arrange for its return to [place]. Phone: [phone], Toll-Free: [phone], Fax: [phone] Page: 1 of 1 Call Id: [id number] [place] Night - Client Nonclinical Telephone Record [place] Night - Client Client Site [place] - Night Contact Type Call Who Is Calling Patient / Member / Family / Caregiver Caller Name [name] Caller Phone Number [phone] Patient Name [name] Call Type Message Only Information Provided Reason for Call Request to [facility] Appointment Initial Comment Caller states has appt Tues at 10:30 and needs to cancel Additional Comment Call Closed By: Sherilyn Dacosta Transaction Date/Time: 12/27/2015 2:51:27 PM (ET)

## 2015-12-29 ENCOUNTER — Ambulatory Visit: Payer: Medicare HMO

## 2016-01-11 ENCOUNTER — Other Ambulatory Visit: Payer: Self-pay | Admitting: Cardiovascular Disease

## 2016-01-19 DIAGNOSIS — M5136 Other intervertebral disc degeneration, lumbar region: Secondary | ICD-10-CM | POA: Diagnosis not present

## 2016-01-19 DIAGNOSIS — Z96651 Presence of right artificial knee joint: Secondary | ICD-10-CM | POA: Diagnosis not present

## 2016-01-19 DIAGNOSIS — M1712 Unilateral primary osteoarthritis, left knee: Secondary | ICD-10-CM | POA: Diagnosis not present

## 2016-01-19 DIAGNOSIS — M4316 Spondylolisthesis, lumbar region: Secondary | ICD-10-CM | POA: Diagnosis not present

## 2016-01-19 DIAGNOSIS — M25561 Pain in right knee: Secondary | ICD-10-CM | POA: Diagnosis not present

## 2016-01-19 DIAGNOSIS — M25462 Effusion, left knee: Secondary | ICD-10-CM | POA: Diagnosis not present

## 2016-01-20 ENCOUNTER — Telehealth: Payer: Self-pay

## 2016-01-20 DIAGNOSIS — Z125 Encounter for screening for malignant neoplasm of prostate: Secondary | ICD-10-CM

## 2016-01-20 DIAGNOSIS — E119 Type 2 diabetes mellitus without complications: Secondary | ICD-10-CM

## 2016-01-20 DIAGNOSIS — E785 Hyperlipidemia, unspecified: Secondary | ICD-10-CM

## 2016-01-20 DIAGNOSIS — I10 Essential (primary) hypertension: Secondary | ICD-10-CM

## 2016-01-20 NOTE — Telephone Encounter (Signed)
Attempted to reach pt to reschedule AWV and schedule CPE. Left message with contact info.

## 2016-01-20 NOTE — Telephone Encounter (Signed)
CPE labs. Pt is aware to fast prior to appt.

## 2016-01-21 ENCOUNTER — Telehealth: Payer: Self-pay | Admitting: Cardiovascular Disease

## 2016-01-21 ENCOUNTER — Other Ambulatory Visit (INDEPENDENT_AMBULATORY_CARE_PROVIDER_SITE_OTHER): Payer: Medicare HMO

## 2016-01-21 ENCOUNTER — Ambulatory Visit (INDEPENDENT_AMBULATORY_CARE_PROVIDER_SITE_OTHER): Payer: Medicare HMO

## 2016-01-21 VITALS — BP 148/70 | HR 68 | Temp 97.9°F | Ht 66.5 in | Wt 186.2 lb

## 2016-01-21 DIAGNOSIS — I10 Essential (primary) hypertension: Secondary | ICD-10-CM

## 2016-01-21 DIAGNOSIS — E785 Hyperlipidemia, unspecified: Secondary | ICD-10-CM | POA: Diagnosis not present

## 2016-01-21 DIAGNOSIS — Z23 Encounter for immunization: Secondary | ICD-10-CM

## 2016-01-21 DIAGNOSIS — Z125 Encounter for screening for malignant neoplasm of prostate: Secondary | ICD-10-CM

## 2016-01-21 DIAGNOSIS — E119 Type 2 diabetes mellitus without complications: Secondary | ICD-10-CM

## 2016-01-21 DIAGNOSIS — Z0000 Encounter for general adult medical examination without abnormal findings: Secondary | ICD-10-CM

## 2016-01-21 LAB — CBC WITH DIFFERENTIAL/PLATELET
Basophils Absolute: 0 10*3/uL (ref 0.0–0.1)
Basophils Relative: 0.3 % (ref 0.0–3.0)
Eosinophils Absolute: 0 10*3/uL (ref 0.0–0.7)
Eosinophils Relative: 0.3 % (ref 0.0–5.0)
HEMATOCRIT: 34 % — AB (ref 39.0–52.0)
HEMOGLOBIN: 10.6 g/dL — AB (ref 13.0–17.0)
LYMPHS ABS: 2.3 10*3/uL (ref 0.7–4.0)
Lymphocytes Relative: 22.8 % (ref 12.0–46.0)
MCHC: 31.3 g/dL (ref 30.0–36.0)
MCV: 72.3 fl — ABNORMAL LOW (ref 78.0–100.0)
MONOS PCT: 7.5 % (ref 3.0–12.0)
Monocytes Absolute: 0.8 10*3/uL (ref 0.1–1.0)
NEUTROS PCT: 69.1 % (ref 43.0–77.0)
Neutro Abs: 6.9 10*3/uL (ref 1.4–7.7)
Platelets: 304 10*3/uL (ref 150.0–400.0)
RBC: 4.71 Mil/uL (ref 4.22–5.81)
RDW: 20.8 % — ABNORMAL HIGH (ref 11.5–15.5)
WBC: 10 10*3/uL (ref 4.0–10.5)

## 2016-01-21 LAB — HEMOGLOBIN A1C: Hgb A1c MFr Bld: 6.7 % — ABNORMAL HIGH (ref 4.6–6.5)

## 2016-01-21 LAB — PSA, MEDICARE: PSA: 0.82 ng/ml (ref 0.10–4.00)

## 2016-01-21 LAB — TSH: TSH: 2.07 u[IU]/mL (ref 0.35–4.50)

## 2016-01-21 NOTE — Telephone Encounter (Signed)
Spoke with pt. He has paperwork from Dr. Ronnie Derby requesting cardiac clearance for knee replacement surgery.  Chart reviewed and pt was started on Imdur at last office visit in April with Almyra Deforest, [place].  Pt reports he thinks this has helped. States shortness of breath has improved but he has not been doing anything very strenuous.  Does walk every morning. Will review with Dr. Angelena Form.

## 2016-01-21 NOTE — Telephone Encounter (Signed)
If he is feeling better on Imdur, Is walking without chest pain or worsened dyspnea, he can proceed with surgery without further cardiac workup. I can sign his form if he brings it by. Gerald Stabs

## 2016-01-21 NOTE — Progress Notes (Signed)
PCP notes:  Health maintenance:  PCV13 - administered Foot exam - will complete at CPE Eye exam - pt will schedule Tetanus - postponed/insurance Shingles - postponed/insurance A1C - completed  Abnormal screenings: None  Patient concerns: None  Nurse concerns: None  Next PCP appt: 01/22/16 @ 1100

## 2016-01-21 NOTE — Patient Instructions (Signed)
[name] , Thank you for taking time to come for your Medicare Wellness Visit. I appreciate your ongoing commitment to your health goals. Please review the following plan we discussed and let me know if I can assist you in the future.   These are the goals we discussed: Goals    . Increase physical activity     Starting 01/21/2016, I will continue to walk and do chores at least 15 min daily.         This is a list of the screening recommended for you and due dates:  Health Maintenance  Topic Date Due  . Complete foot exam   02/05/2016*  . Eye exam for diabetics  07/10/2016*  . Shingles Vaccine  01/20/2017*  . Tetanus Vaccine  01/20/2017*  . Flu Shot  02/09/2016  . Hemoglobin A1C  07/23/2016  . Pneumonia vaccines  Completed  *Topic was postponed. The date shown is not the original due date.    Preventive Care for Adults  A healthy lifestyle and preventive care can promote health and wellness. Preventive health guidelines for adults include the following key practices.  . A routine yearly physical is a good way to check with your health care provider about your health and preventive screening. It is a chance to share any concerns and updates on your health and to receive a thorough exam.  . Visit your dentist for a routine exam and preventive care every 6 months. Brush your teeth twice a day and floss once a day. Good oral hygiene prevents tooth decay and gum disease.  . The frequency of eye exams is based on your age, health, family medical history, use  of contact lenses, and other factors. Follow your health care provider's ecommendations for frequency of eye exams.  . Eat a healthy diet. Foods like vegetables, fruits, whole grains, low-fat dairy products, and lean protein foods contain the nutrients you need without too many calories. Decrease your intake of foods high in solid fats, added sugars, and salt. Eat the right amount of calories for you. Get information about a  proper diet from your health care provider, if necessary.  . Regular physical exercise is one of the most important things you can do for your health. Most adults should get at least 150 minutes of moderate-intensity exercise (any activity that increases your heart rate and causes you to sweat) each week. In addition, most adults need muscle-strengthening exercises on 2 or more days a week.  Silver Sneakers may be a benefit available to you. To determine eligibility, you may visit the website: www.silversneakers.com or contact program at (339)803-1170 Mon-Fri between 8AM-8PM.   . Maintain a healthy weight. The body mass index (BMI) is a screening tool to identify possible weight problems. It provides an estimate of body fat based on height and weight. Your health care provider can find your BMI and can help you achieve or maintain a healthy weight.   For adults 20 years and older: ? A BMI below 18.5 is considered underweight. ? A BMI of 18.5 to 24.9 is normal. ? A BMI of 25 to 29.9 is considered overweight. ? A BMI of 30 and above is considered obese.   . Maintain normal blood lipids and cholesterol levels by exercising and minimizing your intake of saturated fat. Eat a balanced diet with plenty of fruit and vegetables. Blood tests for lipids and cholesterol should begin at age 79 and be repeated every 5 years. If your lipid or cholesterol  levels are high, you are over 50, or you are at high risk for heart disease, you may need your cholesterol levels checked more frequently. Ongoing high lipid and cholesterol levels should be treated with medicines if diet and exercise are not working.  . If you smoke, find out from your health care provider how to quit. If you do not use tobacco, please do not start.  . If you choose to drink alcohol, please do not consume more than 2 drinks per day. One drink is considered to be 12 ounces (355 mL) of beer, 5 ounces (148 mL) of wine, or 1.5 ounces (44 mL) of  liquor.  . If you are 12-34 years old, ask your health care provider if you should take aspirin to prevent strokes.  . Use sunscreen. Apply sunscreen liberally and repeatedly throughout the day. You should seek shade when your shadow is shorter than you. Protect yourself by wearing long sleeves, pants, a wide-brimmed hat, and sunglasses year round, whenever you are outdoors.  . Once a month, do a whole body skin exam, using a mirror to look at the skin on your back. Tell your health care provider of new moles, moles that have irregular borders, moles that are larger than a pencil eraser, or moles that have changed in shape or color.

## 2016-01-21 NOTE — Progress Notes (Signed)
Subjective:   [name] is a 81 y.o. male who presents for Medicare Annual/Subsequent preventive examination.  Review of Systems:  N/A Cardiac Risk Factors include: advanced age (>77men, >63 women);male gender;hypertension;dyslipidemia;diabetes mellitus     Objective:    Vitals: BP 148/70 mmHg  Pulse 68  Temp(Src) 97.9 F (36.6 C) (Oral)  Ht 5' 6.5" (1.689 m)  Wt 186 lb 4 oz (84.482 kg)  BMI 29.61 kg/m2  SpO2 98%  Body mass index is 29.61 kg/(m^2).  Tobacco History  Smoking status  . Former Smoker  Smokeless tobacco  . Never Used    Comment: quit in 1985. smoked 1ppd for 35 years      Counseling given: No   Past Medical History  Diagnosis Date  . Coronary atherosclerosis of unspecified type of vessel, native or graft     s/p stent mid LAD 2005  . DJD (degenerative joint disease)   . GERD (gastroesophageal reflux disease)   . DM (diabetes mellitus) ([place])   . HTN (hypertension)   . Hyperlipidemia    Past Surgical History  Procedure Laterality Date  . Ptca      Hx of it.   . Knee surgery    . Kidney stone removal    . Sigmoidoscopy    . Total knee arthroplasty  02/2011    Right knee  . Craniotomy N/A 01/18/2014    Procedure: CRANIOTOMY HEMATOMA EVACUATION SUBDURAL;  Surgeon: Hosie Spangle, MD;  Location: [place];  Service: Neurosurgery;  Laterality: N/A;  . Left heart catheterization with coronary angiogram N/A 07/27/2011    Procedure: LEFT HEART CATHETERIZATION WITH CORONARY ANGIOGRAM;  Surgeon: Burnell Blanks, MD;  Location: [facility] CATH LAB;  Service: Cardiovascular;  Laterality: N/A;  . Cardiac catheterization    . Brain surgery    . Left heart catheterization with coronary angiogram N/A 10/31/2014    Procedure: LEFT HEART CATHETERIZATION WITH CORONARY ANGIOGRAM;  Surgeon: Sherren Mocha, MD;  Location: [facility] CATH LAB;  Service: Cardiovascular;  Laterality: N/A;  . Coronary artery bypass graft N/A 11/03/2014    Procedure: CORONARY ARTERY BYPASS GRAFTING  (CABG), ON PUMP, TIMES FOUR, USING LEFT INTERNAL MAMMARY, RIGHT GREATER SAPHENOUS VEIN HARVESTED ENDOSCOPICALLY;  Surgeon: Ivin Poot, MD;  Location: [place];  Service: Open Heart Surgery;  Laterality: N/A;  LIMA-LAD; SVG-DIAG; SVG-OM; SVG-RCA  . Tee without cardioversion N/A 11/03/2014    Procedure: TRANSESOPHAGEAL ECHOCARDIOGRAM (TEE);  Surgeon: Ivin Poot, MD;  Location: [place];  Service: Open Heart Surgery;  Laterality: N/A;   Family History  Problem Relation Age of Onset  . Heart disease Father   . Heart failure Mother   . COPD Mother   . Heart disease Sister   . Heart attack Father   . Heart attack Sister   . Heart attack Brother    History  Sexual Activity  . Sexual Activity: No    Outpatient Encounter Prescriptions as of 01/21/2016  Medication Sig  . acetaminophen (TYLENOL) 500 MG tablet Take 1,000 mg by mouth 2 (two) times daily as needed for moderate pain.   Marland Kitchen clopidogrel (PLAVIX) 75 MG tablet TAKE ONE TABLET BY MOUTH ONCE DAILY  . isosorbide mononitrate (IMDUR) 30 MG 24 hr tablet Take 1 tablet (30 mg total) by mouth daily.  Marland Kitchen lisinopril (PRINIVIL,ZESTRIL) 5 MG tablet TAKE ONE TABLET BY MOUTH ONCE DAILY  . metoprolol tartrate (LOPRESSOR) 25 MG tablet TAKE ONE AND ONE-HALF TABLETS BY MOUTH TWICE A DAY  . Multiple Vitamin (MULTIVITAMIN)  tablet Take 1 tablet by mouth daily. CENTRUM SILVER  . pantoprazole (PROTONIX) 40 MG tablet Take 1 tablet (40 mg total) by mouth daily.  . [DISCONTINUED] fluorouracil (EFUDEX) 5 % cream   . [DISCONTINUED] Omeprazole Magnesium 20.6 (20 Base) MG CPDR Take 1 tablet by mouth daily.   No facility-administered encounter medications on file as of 01/21/2016.    Activities of Daily Living In your present state of health, do you have any difficulty performing the following activities: 01/21/2016  Hearing? Y  Vision? N  Difficulty concentrating or making decisions? N  Walking or climbing stairs? N  Dressing or bathing? N  Doing errands,  shopping? N  Preparing Food and eating ? N  Using the Toilet? N  In the past six months, have you accidently leaked urine? N  Do you have problems with loss of bowel control? N  Managing your Medications? N  Managing your Finances? N  Housekeeping or managing your Housekeeping? N    Patient Care Team: Jinny Sanders, MD as PCP - General (Family Medicine)   Assessment:     Visual Acuity Screening   Right eye Left eye Both eyes  Without correction: 20/50-1 20/40 20/30-1  With correction:     Hearing Screening Comments: Wears bilateral hearing aids   Exercise Activities and Dietary recommendations Current Exercise Habits: Home exercise routine, Type of exercise: walking, Time (Minutes): 15, Frequency (Times/Week): 5, Weekly Exercise (Minutes/Week): 75, Intensity: Mild, Exercise limited by: orthopedic condition(s)  Goals    . Increase physical activity     Starting 01/21/2016, I will continue to walk and do chores at least 15 min daily.        Fall Risk Fall Risk  01/21/2016 12/06/2013  Falls in the past year? No Yes  Number falls in past yr: - 2 or more  Risk Factor Category  - High Fall Risk  Risk for fall due to : - Impaired balance/gait   Depression Screen PHQ 2/9 Scores 01/21/2016 12/06/2013  PHQ - 2 Score 0 0    Cognitive Testing MMSE - Mini Mental State Exam 01/21/2016  Orientation to time 5  Orientation to Place 5  Registration 3  Attention/ Calculation 0  Recall 3  Language- name 2 objects 0  Language- repeat 1  Language- follow 3 step command 3  Language- read & follow direction 0  Write a sentence 0  Copy design 0  Total score 20   PLEASE NOTE: A Mini-Cog screen was completed. Maximum score is 20. A value of 0 denotes this part of [place] MMSE was not completed or the patient failed this part of the Mini-Cog screening.   Mini-Cog Screening Orientation to Time - Max 5 pts Orientation to Place - Max 5 pts Registration - Max 3 pts Recall - Max 3  pts Language Repeat - Max 1 pts Language Follow 3 Step Command - Max 3 pts  Immunization History  Administered Date(s) Administered  . Pneumococcal Conjugate-13 01/21/2016  . Pneumococcal Polysaccharide-23 06/19/2013   Screening Tests Health Maintenance  Topic Date Due  . FOOT EXAM  02/05/2016 (Originally 12/25/1944)  . OPHTHALMOLOGY EXAM  07/10/2016 (Originally 12/25/1944)  . ZOSTAVAX  01/20/2017 (Originally 12/24/1996)  . TETANUS/TDAP  01/20/2017 (Originally 12/25/1955)  . INFLUENZA VACCINE  02/09/2016  . HEMOGLOBIN A1C  07/23/2016  . PNA vac Low Risk Adult  Completed      Plan:     I have personally reviewed and addressed the Medicare Annual Wellness questionnaire and have noted  the following in the patient's chart:  A. Medical and social history B. Use of alcohol, tobacco or illicit drugs  C. Current medications and supplements D. Functional ability and status E.  Nutritional status F.  Physical activity G. Advance directives H. List of other physicians I.  Hospitalizations, surgeries, and ER visits in previous 12 months J.  [place] to include hearing, vision, cognitive, depression L. Referrals and appointments - none  In addition, I have reviewed and discussed with patient certain preventive protocols, quality metrics, and best practice recommendations. A written personalized care plan for preventive services as well as general preventive health recommendations were provided to patient.  See attached scanned questionnaire for additional information.   Signed,   Lindell Noe, MHA, BS, LPN Health Advisor

## 2016-01-21 NOTE — Progress Notes (Signed)
I reviewed health advisor's note, was available for consultation, and agree with documentation and plan.  Arieon Scalzo, MD [facility] [place]  

## 2016-01-21 NOTE — Telephone Encounter (Signed)
New Message  Pt requestd to speak w/ RN about paperwork that needed to be completed by Dr Angelena Form. Please call back and discuss.  a

## 2016-01-21 NOTE — Telephone Encounter (Signed)
I spoke with pt and he will bring form into office today.

## 2016-01-21 NOTE — Progress Notes (Signed)
Pre visit review using our clinic review tool, if applicable. No additional management support is needed unless otherwise documented below in the visit note. 

## 2016-01-22 ENCOUNTER — Ambulatory Visit (INDEPENDENT_AMBULATORY_CARE_PROVIDER_SITE_OTHER): Payer: Medicare HMO | Admitting: Family Medicine

## 2016-01-22 ENCOUNTER — Encounter: Payer: Self-pay | Admitting: Family Medicine

## 2016-01-22 VITALS — BP 128/62 | HR 86 | Temp 98.3°F | Ht 66.5 in | Wt 188.0 lb

## 2016-01-22 DIAGNOSIS — Z01818 Encounter for other preprocedural examination: Secondary | ICD-10-CM

## 2016-01-22 DIAGNOSIS — E785 Hyperlipidemia, unspecified: Secondary | ICD-10-CM | POA: Diagnosis not present

## 2016-01-22 DIAGNOSIS — I10 Essential (primary) hypertension: Secondary | ICD-10-CM

## 2016-01-22 DIAGNOSIS — E1142 Type 2 diabetes mellitus with diabetic polyneuropathy: Secondary | ICD-10-CM

## 2016-01-22 LAB — LIPID PANEL
CHOL/HDL RATIO: 5
Cholesterol: 200 mg/dL (ref 0–200)
HDL: 39.7 mg/dL (ref >39.00)
LDL CALC: 130 mg/dL — AB (ref 0–99)
NONHDL: 160.28
Triglycerides: 149 mg/dL (ref 0.0–149.0)
VLDL: 29.8 mg/dL (ref 0.0–40.0)

## 2016-01-22 LAB — COMPREHENSIVE METABOLIC PANEL
ALT: 16 U/L (ref 0–53)
AST: 18 U/L (ref 0–37)
Albumin: 4.4 g/dL (ref 3.5–5.2)
Alkaline Phosphatase: 46 U/L (ref 39–117)
BUN: 22 mg/dL (ref 6–23)
CHLORIDE: 105 meq/L (ref 96–112)
CO2: 23 meq/L (ref 19–32)
Calcium: 9.3 mg/dL (ref 8.4–10.5)
Creatinine, Ser: 0.88 mg/dL (ref 0.40–1.50)
GFR: 88.77 mL/min (ref >60.00)
Glucose, Bld: 98 mg/dL (ref 70–99)
Potassium: 4 mEq/L (ref 3.5–5.1)
Sodium: 139 mEq/L (ref 135–145)
Total Bilirubin: 0.5 mg/dL (ref 0.2–1.2)
Total Protein: 7 g/dL (ref 6.0–8.3)

## 2016-01-22 LAB — HM DIABETES FOOT EXAM

## 2016-01-22 MED ORDER — PRAVASTATIN SODIUM 20 MG PO TABS: 20.0000 mg | ORAL_TABLET | Freq: Every day | ORAL | Status: DC

## 2016-01-22 NOTE — Progress Notes (Signed)
Pre visit review using our clinic review tool, if applicable. No additional management support is needed unless otherwise documented below in the visit note. 

## 2016-01-22 NOTE — Assessment & Plan Note (Signed)
Well controlled. Continue current medication.  

## 2016-01-22 NOTE — Assessment & Plan Note (Signed)
Good control per home CBGs, awaiting A1C.

## 2016-01-22 NOTE — Progress Notes (Signed)
   Subjective:    Patient ID: [name], male    DOB: 12-12-1934, 81 y.o.   MRN: [id number]  HPI  81 year old male with CAD, HTN, high cholesterol and DM presents for pre op clearance prior to upcoming surgery for   Left knee replacement by Dr. Ronnie Derby.  He has appt for cardiac clearance with his cardiologist upcoming.   Diabetes: Well controlled.  On no med. Lab Results  Component Value Date   HGBA1C 6.7* 01/21/2016  sing medications without difficulties: Hypoglycemic episodes:None Hyperglycemic episodes:None Feet problems: sensitive, no burning with neuropathy stable, no ulcer Blood Sugars averaging: FBS 130, 2 hours postprandial 110 eye exam within last year: due   Hypertension:   Well controlled on lisinopril and metoprolol. BP Readings from Last 3 Encounters:  01/22/16 128/62  01/21/16 148/70  10/12/15 140/80  Using medication without problems or lightheadedness:  None Chest pain with exertion: none Edema:None Short of breath: stable Average home BPs: Other issues:120/70s  Daughter passed away last month with  Colon cancer . He is handling fairly well. After surgery trying to go take care of grandkids.  Elevated Cholesterol: inadequate control on no statin. LDL not at goal < 70. Some soreness in past. Using medications without problems: Muscle aches:  Diet compliance: Moderate Exercise: limited. Other complaints:    Social History /Family History/Past Medical History reviewed and updated if needed.    Review of Systems  Constitutional: Negative for fever and fatigue.  HENT: Negative for ear pain.   Eyes: Negative for pain.  Respiratory: Negative for cough and shortness of breath.   Cardiovascular: Negative for chest pain, palpitations and leg swelling.  Gastrointestinal: Negative for abdominal pain.  Genitourinary: Negative for dysuria.  Musculoskeletal: Negative for arthralgias.  Neurological: Negative for syncope, light-headedness and headaches.    Psychiatric/Behavioral: Negative for dysphoric mood.       Objective:   Physical Exam  Constitutional: Vital signs are normal. He appears well-developed and well-nourished.  HENT:  Head: Normocephalic.  Right Ear: Hearing normal.  Left Ear: Hearing normal.  Nose: Nose normal.  Mouth/Throat: Oropharynx is clear and moist and mucous membranes are normal.  Neck: Trachea normal. Carotid bruit is not present. No thyroid mass and no thyromegaly present.  Cardiovascular: Normal rate, regular rhythm and normal pulses.  Exam reveals no gallop, no distant heart sounds and no friction rub.   No murmur heard. No peripheral edema  Pulmonary/Chest: Effort normal and breath sounds normal. No respiratory distress.  Skin: Skin is warm, dry and intact. No rash noted.  Psychiatric: He has a normal mood and affect. His speech is normal and behavior is normal. Thought content normal.      Diabetic foot exam: Normal inspection No skin breakdown No calluses  Normal DP pulses Decreased sensation to light touch and monofilament Nails normal     Assessment & Plan:   The patient's preventative maintenance and recommended screening tests for an annual wellness exam were reviewed in full today. Brought up to date unless services declined.  Counselled on the importance of diet, exercise, and its role in overall health and mortality. The patient's FH and SH was reviewed, including their home life, tobacco status, and drug and alcohol status.   Vaccines:Uptodate with PNA ? prevnar or pneumococcal, due for shingles and Tdap Prostate: not indicated Colon:2011,diverticulosis, on polyps, no further Colonoscopy needed Former smoker, > 35 pack year history, asymptomatic, consider spirometry and Chest CT at a later time for screening

## 2016-01-22 NOTE — Patient Instructions (Addendum)
Remember yearly eye exam.  Work on low carb and low chol diet.  restart pravastatin 20 mg daily.. At least try to see if tolerated every other day.

## 2016-01-22 NOTE — Assessment & Plan Note (Signed)
Inadequate control off pravastatin.Marland Kitchen Restart but try every other day to avoid joint pain.

## 2016-01-25 NOTE — Telephone Encounter (Signed)
Faxed signed clearance form to Dr. Ronnie Derby, Sports Medicine and Joint Replacement---[phone].  Placed faxed form in Dr. Camillia Herter blue folder.

## 2016-01-27 NOTE — Telephone Encounter (Signed)
Form given to medical records to be scanned into chart.

## 2016-02-05 ENCOUNTER — Encounter: Payer: Self-pay | Admitting: Family Medicine

## 2016-02-05 ENCOUNTER — Ambulatory Visit (INDEPENDENT_AMBULATORY_CARE_PROVIDER_SITE_OTHER): Payer: Medicare HMO | Admitting: Family Medicine

## 2016-02-05 DIAGNOSIS — E785 Hyperlipidemia, unspecified: Secondary | ICD-10-CM

## 2016-02-05 DIAGNOSIS — R1032 Left lower quadrant pain: Secondary | ICD-10-CM | POA: Diagnosis not present

## 2016-02-05 DIAGNOSIS — R1031 Right lower quadrant pain: Secondary | ICD-10-CM | POA: Diagnosis not present

## 2016-02-05 NOTE — Patient Instructions (Addendum)
Start trial of probiotic OTC ( example is Align or culturelle) daily x 2 weeks or so. Avoid greasy foods and gas forming foods. Decrease stress as able. Can try Beano. If pain is worsening, call ASAP.

## 2016-02-05 NOTE — Assessment & Plan Note (Signed)
Restart statin and recheck in 3 months.  Encouraged exercise, weight loss, healthy eating habits.

## 2016-02-05 NOTE — Progress Notes (Signed)
   Subjective:    Patient ID: [name], male    DOB: Sep 11, 1934, 81 y.o.   MRN: [id number]  HPI  81 year old male presents for intermittent gas and bloating, mild low abd pain ( 4/10 on pain scale.. Pain resolves with passing gas and BM.   Daily x 2 weeks. No constipation, no new diarrhea, no blood in stool. Pain is usually worse after  Beans/fatty meal.  Foul smelling fluctuance.  No diet change. No recent antibiotics. Moderately greasy diet.  Last 2011 colonoscopy ; diverticulosis.   he has not tried any OTC gas meds.  Under stress, daughter passed away in last month, dog ill.  Reviewed labs in detail.   Elevated Cholesterol: Pt has now restarted chol med without return of SE. LDL not at goal off med, was almost at goal when on.  Diet compliance: moderate Exercise: limited. Other complaints:   Review of Systems  Constitutional: Negative for fatigue.  HENT: Negative for ear pain.   Eyes: Negative for pain.  Respiratory: Negative for shortness of breath.   Psychiatric/Behavioral: Positive for dysphoric mood.       Objective:   Physical Exam  Constitutional: Vital signs are normal. He appears well-developed and well-nourished.  Elderly male in NAD, using cane  HENT:  Head: Normocephalic.  Right Ear: Hearing normal.  Left Ear: Hearing normal.  Nose: Nose normal.  Mouth/Throat: Oropharynx is clear and moist and mucous membranes are normal.  Neck: Trachea normal. Carotid bruit is not present. No thyroid mass and no thyromegaly present.  Cardiovascular: Normal rate, regular rhythm and normal pulses.  Exam reveals no gallop, no distant heart sounds and no friction rub.   No murmur heard. No peripheral edema  Pulmonary/Chest: Effort normal and breath sounds normal. No respiratory distress.  Skin: Skin is warm, dry and intact. No rash noted.  Psychiatric: His speech is normal and behavior is normal. Thought content normal. His mood appears not anxious. He exhibits a  depressed mood. He expresses no homicidal and no suicidal ideation. He expresses no suicidal plans and no homicidal plans.   Appropriate affect given recent loss of  Daughter.          Assessment & Plan:

## 2016-02-05 NOTE — Progress Notes (Signed)
Pre visit review using our clinic review tool, if applicable. No additional management support is needed unless otherwise documented below in the visit note. 

## 2016-02-05 NOTE — Assessment & Plan Note (Addendum)
Possible IBS worse with recent diet and stress. No red flags, doubtful diverticulitis given symptoms and exam.  Will have pt start probiotics, avoid greasy foods, increase water and fiber.  Decrease stress as able.  if not imrpoving consider further eva with CT. CMET nml on labs.

## 2016-02-15 ENCOUNTER — Telehealth: Payer: Self-pay | Admitting: Family Medicine

## 2016-02-15 NOTE — Telephone Encounter (Signed)
Patient called and said he tried to make an appointment for his surgery with Dr.Lucey.  Dr.Lucey's office told patient they haven't received clearance for surgery from [facility].  Patient would like clearance sent to [facility], so he can schedule appointment.

## 2016-02-16 NOTE — Telephone Encounter (Signed)
Send pre op OV note.

## 2016-02-16 NOTE — Telephone Encounter (Signed)
Faxed pt's wife was notified.

## 2016-02-24 ENCOUNTER — Other Ambulatory Visit: Payer: Self-pay | Admitting: Orthopedic Surgery

## 2016-02-24 DIAGNOSIS — Z85828 Personal history of other malignant neoplasm of skin: Secondary | ICD-10-CM | POA: Diagnosis not present

## 2016-02-24 DIAGNOSIS — L821 Other seborrheic keratosis: Secondary | ICD-10-CM | POA: Diagnosis not present

## 2016-02-24 DIAGNOSIS — L570 Actinic keratosis: Secondary | ICD-10-CM | POA: Diagnosis not present

## 2016-02-24 DIAGNOSIS — D485 Neoplasm of uncertain behavior of skin: Secondary | ICD-10-CM | POA: Diagnosis not present

## 2016-02-24 DIAGNOSIS — D045 Carcinoma in situ of skin of trunk: Secondary | ICD-10-CM | POA: Diagnosis not present

## 2016-02-24 DIAGNOSIS — D1801 Hemangioma of skin and subcutaneous tissue: Secondary | ICD-10-CM | POA: Diagnosis not present

## 2016-03-31 ENCOUNTER — Encounter (HOSPITAL_COMMUNITY)
Admission: RE | Admit: 2016-03-31 | Discharge: 2016-03-31 | Disposition: A | Discharge status: Home / Self Care | Payer: Medicare HMO | Source: Ambulatory Visit | Attending: Orthopedic Surgery | Admitting: Orthopedic Surgery

## 2016-03-31 ENCOUNTER — Encounter (HOSPITAL_COMMUNITY): Payer: Self-pay

## 2016-03-31 DIAGNOSIS — Z951 Presence of aortocoronary bypass graft: Secondary | ICD-10-CM | POA: Insufficient documentation

## 2016-03-31 DIAGNOSIS — M1712 Unilateral primary osteoarthritis, left knee: Secondary | ICD-10-CM | POA: Insufficient documentation

## 2016-03-31 DIAGNOSIS — Z01810 Encounter for preprocedural cardiovascular examination: Secondary | ICD-10-CM | POA: Diagnosis not present

## 2016-03-31 DIAGNOSIS — I10 Essential (primary) hypertension: Secondary | ICD-10-CM | POA: Insufficient documentation

## 2016-03-31 DIAGNOSIS — E119 Type 2 diabetes mellitus without complications: Secondary | ICD-10-CM | POA: Insufficient documentation

## 2016-03-31 DIAGNOSIS — S299XXA Unspecified injury of thorax, initial encounter: Secondary | ICD-10-CM | POA: Diagnosis not present

## 2016-03-31 DIAGNOSIS — Z01818 Encounter for other preprocedural examination: Secondary | ICD-10-CM

## 2016-03-31 DIAGNOSIS — I2510 Atherosclerotic heart disease of native coronary artery without angina pectoris: Secondary | ICD-10-CM | POA: Insufficient documentation

## 2016-03-31 DIAGNOSIS — K219 Gastro-esophageal reflux disease without esophagitis: Secondary | ICD-10-CM | POA: Insufficient documentation

## 2016-03-31 DIAGNOSIS — Z01812 Encounter for preprocedural laboratory examination: Secondary | ICD-10-CM | POA: Diagnosis not present

## 2016-03-31 DIAGNOSIS — Z87891 Personal history of nicotine dependence: Secondary | ICD-10-CM | POA: Diagnosis not present

## 2016-03-31 DIAGNOSIS — E785 Hyperlipidemia, unspecified: Secondary | ICD-10-CM | POA: Insufficient documentation

## 2016-03-31 HISTORY — DX: Depression, unspecified: F32.A

## 2016-03-31 HISTORY — DX: Major depressive disorder, single episode, unspecified: F32.9

## 2016-03-31 HISTORY — DX: Personal history of other diseases of the digestive system: Z87.19

## 2016-03-31 LAB — CBC WITH DIFFERENTIAL/PLATELET
BASOS ABS: 0 10*3/uL (ref 0.0–0.1)
Basophils Relative: 1 %
EOS PCT: 4 %
Eosinophils Absolute: 0.3 10*3/uL (ref 0.0–0.7)
HEMATOCRIT: 39.2 % (ref 39.0–52.0)
HEMOGLOBIN: 12 g/dL — AB (ref 13.0–17.0)
LYMPHS PCT: 29 %
Lymphs Abs: 2.3 10*3/uL (ref 0.7–4.0)
MCH: 24.5 pg — ABNORMAL LOW (ref 26.0–34.0)
MCHC: 30.6 g/dL (ref 30.0–36.0)
MCV: 80.2 fL (ref 78.0–100.0)
Monocytes Absolute: 0.8 10*3/uL (ref 0.1–1.0)
Monocytes Relative: 10 %
NEUTROS ABS: 4.6 10*3/uL (ref 1.7–7.7)
NEUTROS PCT: 56 %
PLATELETS: 260 10*3/uL (ref 150–400)
RBC: 4.89 MIL/uL (ref 4.22–5.81)
RDW: 17.4 % — ABNORMAL HIGH (ref 11.5–15.5)
WBC: 8.1 10*3/uL (ref 4.0–10.5)

## 2016-03-31 LAB — PROTIME-INR
INR: 1.08
PROTHROMBIN TIME: 14 s (ref 11.4–15.2)

## 2016-03-31 LAB — COMPREHENSIVE METABOLIC PANEL
ALK PHOS: 50 U/L (ref 38–126)
ALT: 19 U/L (ref 17–63)
AST: 24 U/L (ref 15–41)
Albumin: 4.1 g/dL (ref 3.5–5.0)
Anion gap: 9 (ref 5–15)
BUN: 14 mg/dL (ref 6–20)
CHLORIDE: 108 mmol/L (ref 101–111)
CO2: 23 mmol/L (ref 22–32)
CREATININE: 0.91 mg/dL (ref 0.61–1.24)
Calcium: 9.5 mg/dL (ref 8.9–10.3)
GFR calc Af Amer: >60 mL/min (ref >60)
Glucose, Bld: 95 mg/dL (ref 65–99)
Potassium: 3.9 mmol/L (ref 3.5–5.1)
Sodium: 140 mmol/L (ref 135–145)
Total Bilirubin: 0.7 mg/dL (ref 0.3–1.2)
Total Protein: 6.8 g/dL (ref 6.5–8.1)

## 2016-03-31 LAB — GLUCOSE, CAPILLARY: GLUCOSE-CAPILLARY: 101 mg/dL — AB (ref 65–99)

## 2016-03-31 LAB — URINALYSIS, ROUTINE W REFLEX MICROSCOPIC
Glucose, UA: NEGATIVE mg/dL
Hgb urine dipstick: NEGATIVE
KETONES UR: 15 mg/dL — AB
NITRITE: NEGATIVE
PH: 6 (ref 5.0–8.0)
PROTEIN: NEGATIVE mg/dL
Specific Gravity, Urine: 1.028 (ref 1.005–1.030)

## 2016-03-31 LAB — SURGICAL PCR SCREEN
MRSA, PCR: NEGATIVE
STAPHYLOCOCCUS AUREUS: NEGATIVE

## 2016-03-31 LAB — URINE MICROSCOPIC-ADD ON: RBC / HPF: NONE SEEN RBC/hpf (ref 0–5)

## 2016-03-31 NOTE — Pre-Procedure Instructions (Signed)
[name]  03/31/2016      [facility] [place], [place] - [address] [place] [address] Phone: [phone] Fax: [phone]    Your procedure is scheduled on Oct. 2  Report to [facility] Admitting at  7:30 A.M.  Call this number if you have problems the morning of surgery:  [phone]   Remember:  Do not eat food or drink liquids after midnight.  Take these medicines the morning of surgery with A SIP OF WATER :tylenol if needed, metoprolol (lopressor), pantoprazole (protonix),nitrostat if needed              Stop clopidogrel per Dr. Ronnie Derby             1 week prior surgery stop:advil, motrin, ibuprofen, aleve, BC, Goody's, vitamins and herbal medicines.    How to Manage Your Diabetes Before and After Surgery  Why is it important to control my blood sugar before and after surgery? . Improving blood sugar levels before and after surgery helps healing and can limit problems. . A way of improving blood sugar control is eating a healthy diet by: o  Eating less sugar and carbohydrates o  Increasing activity/exercise o  Talking with your doctor about reaching your blood sugar goals . High blood sugars (greater than 180 mg/dL) can raise your risk of infections and slow your recovery, so you will need to focus on controlling your diabetes during the weeks before surgery. . Make sure that the doctor who takes care of your diabetes knows about your planned surgery including the date and location.  How do I manage my blood sugar before surgery? . Check your blood sugar at least 4 times a day, starting 2 days before surgery, to make sure that the level is not too high or low. o Check your blood sugar the morning of your surgery when you wake up and every 2 hours until you get to the Short Stay unit. . If your blood sugar is less than 70 mg/dL, you will need to treat for low blood sugar: o Do not take insulin. o Treat a low blood  sugar (less than 70 mg/dL) with  cup of clear juice (cranberry or apple), 4 glucose tablets, OR glucose gel. o Recheck blood sugar in 15 minutes after treatment (to make sure it is greater than 70 mg/dL). If your blood sugar is not greater than 70 mg/dL on recheck, call [phone] for further instructions. . Report your blood sugar to the short stay nurse when you get to Short Stay.  . If you are admitted to the hospital after surgery: o Your blood sugar will be checked by the staff and you will probably be given insulin after surgery (instead of oral diabetes medicines) to make sure you have good blood sugar levels. o The goal for blood sugar control after surgery is 80-180 mg/dL.              Do not wear jewelry.  Do not wear lotions, powders, or cologne, or deoderant.  Do not shave 48 hours prior to surgery.  Men may shave face and neck.  Do not bring valuables to the hospital.  [facility] is not responsible for any belongings or valuables.  Contacts, dentures or bridgework may not be worn into surgery.  Leave your suitcase in the car.  After surgery it may be brought to your room.  For patients admitted to the hospital, discharge time  will be determined by your treatment team.  Patients discharged the day of surgery will not be allowed to drive home.    Special instructions:  Review preparing for surgery  Please read over the following fact sheets that you were given. Coughing and Deep Breathing and MRSA Information

## 2016-03-31 NOTE — Progress Notes (Addendum)
PCP:Dr.Amy Bledsoe  Cardiologist: Dr. Angelena Form  Fasting blood sugars 120-135. Doesn't take any meds.  Pt. To follow up with Dr. Ronnie Derby when to stop plavix.  Pt. Stated he stopped imdur because he wasn't having chest pain. Encourage pt. To  Call Dr. Camillia Herter office to let them know he has stopped the med.  Pt. Also report he has chest pain mid sternum after eating. Takes an antacid and it goes away. He has noticed it more frequently in the past month. States this has been going on for yrs. Garen Grams, PA aware.

## 2016-04-01 LAB — HEMOGLOBIN A1C
Hgb A1c MFr Bld: 6.7 % — ABNORMAL HIGH (ref 4.8–5.6)
Mean Plasma Glucose: 146 mg/dL

## 2016-04-01 NOTE — Progress Notes (Signed)
Anesthesia Chart Review:  Pt is a 81 year old male scheduled for L total knee arthroplasty on 04/11/2016 with Vickey Huger, MD.   PMH includes:  CAD (s/p CABG 11/03/14; s/p stent to LAD 2005), HTN, DM, hyperlipidemia, subdural hematoma (s/p craniotomy evacuation 01/18/14), GERD. Former smoker. BMI 30. S/p TKA 2012.   Medications include: plavix, imdur. Lisinopril, metoprolol, nitro, protonix, pravachol.   Preoperative labs reviewed.  HgbA1c 6.7, glucose 95  CXR 03/31/16: No active disease. Hiatal hernia. Coronary atherosclerosis.  EKG 10/12/15: NSR. RBBB.   Echo 10/15/15:  - Left ventricle: The cavity size was normal. There was moderate concentric hypertrophy. Systolic function was normal. The estimated ejection fraction was in the range of 60% to 65%. Wall motion was normal; there were no regional wall motion abnormalities. Features are consistent with a pseudonormal left ventricular filling pattern, with concomitant abnormal relaxation and increased filling pressure (grade 2 diastolic dysfunction). - Left atrium: The atrium was severely dilated. - Right ventricle: The cavity size was mildly dilated. Wall thickness was normal. - Right atrium: The atrium was mildly dilated.  Cardiologist is Murvin Natal, MD who has cleared pt for surgery noting in telephone encounter 01/21/16 "If he is feeling better on Imdur, Is walking without chest pain or worsened dyspnea, he can proceed with surgery without further cardiac workup". Pt reported chronic CP at PAT and that he chose to stop imdur himself ~ 4 to 6 weeks ago. I will route this note to Dr. Julianne Handler for clarification on imdur and to request permission to hold plavix for 7 days for spinal anesthesia.   Willeen Cass, FNP-BC [facility]/Anesthesiology Phone: [phone] 04/01/2016 4:23 PM

## 2016-04-07 DIAGNOSIS — H52203 Unspecified astigmatism, bilateral: Secondary | ICD-10-CM | POA: Diagnosis not present

## 2016-04-07 DIAGNOSIS — H524 Presbyopia: Secondary | ICD-10-CM | POA: Diagnosis not present

## 2016-04-08 ENCOUNTER — Ambulatory Visit (INDEPENDENT_AMBULATORY_CARE_PROVIDER_SITE_OTHER): Payer: Medicare HMO | Admitting: Cardiovascular Disease

## 2016-04-08 ENCOUNTER — Encounter: Payer: Self-pay | Admitting: Cardiovascular Disease

## 2016-04-08 VITALS — BP 162/64 | HR 60 | Ht 66.5 in | Wt 189.0 lb

## 2016-04-08 DIAGNOSIS — I2510 Atherosclerotic heart disease of native coronary artery without angina pectoris: Secondary | ICD-10-CM | POA: Diagnosis not present

## 2016-04-08 DIAGNOSIS — I10 Essential (primary) hypertension: Secondary | ICD-10-CM

## 2016-04-08 DIAGNOSIS — E785 Hyperlipidemia, unspecified: Secondary | ICD-10-CM | POA: Diagnosis not present

## 2016-04-08 MED ORDER — LISINOPRIL 10 MG PO TABS: 10.0000 mg | ORAL_TABLET | Freq: Every day | ORAL | 3 refills | Status: DC

## 2016-04-08 MED ORDER — ROSUVASTATIN CALCIUM 5 MG PO TABS: 5.0000 mg | ORAL_TABLET | Freq: Every day | ORAL | 3 refills | Status: DC

## 2016-04-08 MED ORDER — TRANEXAMIC ACID 1000 MG/10ML IV SOLN
1000.0000 mg | INTRAVENOUS | Status: AC
  Administered 2016-04-11: 1000 mg via INTRAVENOUS
  Filled 2016-04-08: qty 10

## 2016-04-08 NOTE — Progress Notes (Signed)
Chief Complaint  Patient presents with  . Coronary Artery Disease     History of Present Illness: 81 yo WM with history of CAD s/p CABG 2016, prior coronary stent placement before his CABG, HTN, Hyperlipidemia and GERD who presents today for cardiac follow up. He has been followed for CAD since 2005 when he had a LAD stent placed. He has not tolerated statins in the past secondary to myalgias. He was evaluated April 2016 for chest pain. Cardiac catheterization 10/31/14 with multivessel disease with severe 95% distal left main stenosis. He was admitted 4/22-11/09/14 and underwent CABG 4 (LIMA-LAD, SVG-DX, SVG-PDA, SVG-OM) per Dr. Prescott Gum. Postoperative course was notable for frequent PACs. He was placed on amiodarone but this was discontinued secondary to significant nausea. He otherwise remained in sinus rhythm. Otherwise, his postoperative course was fairly uneventful. Continued dyspnea post bypass. Echo April 2017 with normal LV function and no significant valvular disease.   He is here today for follow up. No chest pain or dyspnea. BP has been up at home. No LE edema.    Primary Care Physician: Eliezer Lofts, MD   Past Medical History:  Diagnosis Date  . Coronary atherosclerosis of unspecified type of vessel, native or graft    s/p stent mid LAD 2005  . Depression   . DJD (degenerative joint disease)   . DM (diabetes mellitus) ([place])   . GERD (gastroesophageal reflux disease)   . History of hiatal hernia   . HTN (hypertension)   . Hyperlipidemia     Past Surgical History:  Procedure Laterality Date  . BRAIN SURGERY    . CARDIAC CATHETERIZATION    . CORONARY ARTERY BYPASS GRAFT N/A 11/03/2014   Procedure: CORONARY ARTERY BYPASS GRAFTING (CABG), ON PUMP, TIMES FOUR, USING LEFT INTERNAL MAMMARY, RIGHT GREATER SAPHENOUS VEIN HARVESTED ENDOSCOPICALLY;  Surgeon: Ivin Poot, MD;  Location: [place];  Service: Open Heart Surgery;  Laterality: N/A;  LIMA-LAD; SVG-DIAG; SVG-OM; SVG-RCA    . CRANIOTOMY N/A 01/18/2014   Procedure: CRANIOTOMY HEMATOMA EVACUATION SUBDURAL;  Surgeon: Hosie Spangle, MD;  Location: [place];  Service: Neurosurgery;  Laterality: N/A;  . kidney stone removal    . KNEE SURGERY    . LEFT HEART CATHETERIZATION WITH CORONARY ANGIOGRAM N/A 07/27/2011   Procedure: LEFT HEART CATHETERIZATION WITH CORONARY ANGIOGRAM;  Surgeon: Burnell Blanks, MD;  Location[facility] C[facility] LAB;  Service: Cardiovascular;  Laterality: N/A;  . LEFT HEART CATHETERIZATION WITH CORONARY ANGIOGRAM N/A 10/31/2014   Procedure: LEFT HEART CATHETERIZATION WITH CORONARY ANGIOGRAM;  Surgeon: Sherren Mocha, MD;  Location: [facility] CATH LAB;  Service: Cardiovascular;  Laterality: N/A;  . PTCA     Hx of it.   Marland Kitchen SIGMOIDOSCOPY    . TEE WITHOUT CARDIOVERSION N/A 11/03/2014   Procedure: TRANSESOPHAGEAL ECHOCARDIOGRAM (TEE);  Surgeon: Ivin Poot, MD;  Location: [place];  Service: Open Heart Surgery;  Laterality: N/A;  . TOTAL KNEE ARTHROPLASTY  02/2011   Right knee    Current Outpatient Prescriptions  Medication Sig Dispense Refill  . acetaminophen (TYLENOL) 500 MG tablet Take 1,000 mg by mouth 2 (two) times daily.     . clopidogrel (PLAVIX) 75 MG tablet TAKE ONE TABLET BY MOUTH ONCE DAILY 90 tablet 2  . isosorbide mononitrate (IMDUR) 30 MG 24 hr tablet Take 1 tablet (30 mg total) by mouth daily. (Patient not taking: Reported on 03/30/2016) 30 tablet 6  . lisinopril (PRINIVIL,ZESTRIL) 10 MG tablet Take 1 tablet (10 mg total) by mouth daily.  90 tablet 3  . metoprolol tartrate (LOPRESSOR) 25 MG tablet TAKE ONE AND ONE-HALF TABLETS BY MOUTH TWICE A DAY (Patient taking differently: TAKE 1.5 TABLETS (37.5MG) BY MOUTH TWICE DAILY) 90 tablet 10  . nitroGLYCERIN (NITROSTAT) 0.4 MG SL tablet Place 0.4 mg under the tongue every 5 (five) minutes as needed for chest pain.    . pantoprazole (PROTONIX) 40 MG tablet Take 1 tablet (40 mg total) by mouth daily. 30 tablet 11  . rosuvastatin (CRESTOR) 5 MG tablet Take  1 tablet (5 mg total) by mouth daily. 90 tablet 3   No current facility-administered medications for this visit.    Facility-Administered Medications Ordered in Other Visits  Medication Dose Route Frequency Provider Last Rate Last Dose  . [START ON 04/11/2016] tranexamic acid (CYKLOKAPRON) 1,000 mg in sodium chloride 0.9 % 100 mL IVPB  1,000 mg Intravenous To OR Vickey Huger, MD        Allergies  Allergen Reactions  . Protamine Rash    Hypotension, Rash, unstable vitals.  Hypotension, Rash, unstable vitals.   . Statins Other (See Comments)    Caused soreness (Lipitor, Zocor)  . Rosuvastatin Calcium Other (See Comments)    Caused soreness (Lipitor, Zocor)  . Imodium [Loperamide] Itching and Rash    Social History   Social History  . Marital status: Married    Spouse name: N/A  . Number of children: N/A  . Years of education: N/A   Occupational History  . Not on file.   Social History Main Topics  . Smoking status: Former Research scientist (life sciences)  . Smokeless tobacco: Former Systems developer     Comment: quit in 1985. smoked 1ppd for 35 years   . Alcohol use No  . Drug use: No  . Sexual activity: No   Other Topics Concern  . Not on file   Social History Narrative   Married with children. 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 10:20 am    Has living will,  HCPOA: jerrie Dezeeuw, full code (reviewed 7)    Family History  Problem Relation Age of Onset  . Heart disease Father   . Heart attack Father   . Heart failure Mother   . COPD Mother   . Heart disease Sister   . Heart attack Sister   . Heart attack Brother     Review of Systems:  As stated in the HPI and otherwise negative.   BP (!) 162/64   Pulse 60   Ht 5' 6.5" (1.689 m)   Wt 189 lb (85.7 kg)   BMI 30.05 kg/m   Physical Examination: General: Well developed, well nourished, NAD  HEENT: OP clear, mucus membranes moist  SKIN: warm, dry. No rashes. Neuro: No focal deficits   Musculoskeletal: Muscle strength 5/5 all ext  Psychiatric: Mood and affect normal  Neck: No JVD, no carotid bruits, no thyromegaly, no lymphadenopathy.  Lungs:Clear bilaterally, no wheezes, rhonci, crackles Cardiovascular: Regular rate and rhythm. No murmurs, gallops or rubs. Abdomen:Soft. Bowel sounds present. Non-tender.  Extremities: No lower extremity edema. Pulses are 2 + in the bilateral DP/PT.  Echo April 2017: Left ventricle: The cavity size was normal. There was moderate   concentric hypertrophy. Systolic function was normal. The   estimated ejection fraction was in the range of 60% to 65%. Wall   motion was normal; there were no regional wall motion   abnormalities. Features are consistent with a pseudonormal left   ventricular filling pattern, with concomitant  abnormal relaxation   and increased filling pressure (grade 2 diastolic dysfunction). - Left atrium: The atrium was severely dilated. - Right ventricle: The cavity size was mildly dilated. Wall   thickness was normal. - Right atrium: The atrium was mildly dilated.  EKG:  EKG is ordered today. The ekg ordered today demonstrates sinus, rate 61 bpm. PACs. RBBB  Recent Labs: 01/21/2016: TSH 2.07 03/31/2016: ALT 19; BUN 14; Creatinine, Ser 0.91; Hemoglobin 12.0; Platelets 260; Potassium 3.9; Sodium 140   Lipid Panel    Component Value Date/Time   CHOL 200 01/21/2016 1435   TRIG 149.0 01/21/2016 1435   HDL 39.70 01/21/2016 1435   CHOLHDL 5 01/21/2016 1435   VLDL 29.8 01/21/2016 1435   LDLCALC 130 (H) 01/21/2016 1435     Wt Readings from Last 3 Encounters:  04/08/16 189 lb (85.7 kg)  03/31/16 187 lb 8 oz (85 kg)  02/05/16 188 lb (85.3 kg)     Other studies Reviewed: Additional studies/ records that were reviewed today include: . Review of the above records demonstrates:    Assessment and Plan:   1. Coronary artery disease:  S/P CABG 4 V April 2016. Stable. Continue beta blocker, Ace inh, Plavix. ASA  stopped due to bruising.   2. Essential hypertension: BP is elevated today and at home. Will increase Lisinopril to 10 mg daily. Check BMET in 12 weeks. (Normal BMET last week)  3. Hyperlipidemia: LDL is not at goal. He has not tolerated statins well but refuses to consider PCSK9inh therapy. He will try low dose Crestor 5 mg daily. Repeat LFTs and lipids in 12 weeks.   4. Hx of Subdural Hematoma: This was traumatic and the neurosurgeon has released him.   Current medicines are reviewed at length with the patient today.  The patient does not have concerns regarding medicines.  The following changes have been made:  no change  Labs/ tests ordered today include:   Orders Placed This Encounter  Procedures  . Lipid Profile  . Comp Met (CMET)  . EKG 12-Lead    Disposition:   FU with me in 6 months  Signed, Lauree Chandler, MD 04/08/2016 12:26 PM    [facility] [address], [place], [place]  [address] Phone: [phone]; Fax: [phone]

## 2016-04-08 NOTE — Patient Instructions (Addendum)
Medication Instructions:  Your physician has recommended you make the following change in your medication:  Start Rosuvastatin 5 mg by mouth daily.  Increase lisinopril to 10 mg by mouth daily.      Labwork: Your physician recommends that you return for lab work in: 12 weeks.  This will be fasting. The lab opens at 7:30 AM.  Scheduled for December 19,2017    Testing/Procedures: none  Follow-Up: Your physician wants you to follow-up in: 6 months.  You will receive a reminder letter in the mail two months in advance. If you don't receive a letter, please call our office to schedule the follow-up appointment.    Check blood pressure at home and keep record of readings.  Call us if top number is running greater than 140. .     If you need a refill on your cardiac medications before your next appointment, please call your pharmacy.

## 2016-04-11 ENCOUNTER — Inpatient Hospital Stay (HOSPITAL_COMMUNITY): Payer: Medicare HMO | Admitting: Anesthesiology

## 2016-04-11 ENCOUNTER — Inpatient Hospital Stay (HOSPITAL_COMMUNITY): Payer: Medicare HMO | Admitting: Vascular Surgery

## 2016-04-11 ENCOUNTER — Encounter (HOSPITAL_COMMUNITY): Payer: Self-pay | Admitting: *Deleted

## 2016-04-11 ENCOUNTER — Encounter (HOSPITAL_COMMUNITY)
Admission: RE | Disposition: A | Discharge status: Home Health | Payer: Self-pay | Source: Ambulatory Visit | Attending: Orthopedic Surgery

## 2016-04-11 ENCOUNTER — Inpatient Hospital Stay (HOSPITAL_COMMUNITY)
Admission: RE | Admit: 2016-04-11 | Discharge: 2016-04-12 | DRG: 470 | Disposition: A | Discharge status: Home Health | Payer: Medicare HMO | Source: Ambulatory Visit | Attending: Orthopedic Surgery | Admitting: Orthopedic Surgery

## 2016-04-11 DIAGNOSIS — K219 Gastro-esophageal reflux disease without esophagitis: Secondary | ICD-10-CM | POA: Diagnosis not present

## 2016-04-11 DIAGNOSIS — I10 Essential (primary) hypertension: Secondary | ICD-10-CM | POA: Diagnosis not present

## 2016-04-11 DIAGNOSIS — Z825 Family history of asthma and other chronic lower respiratory diseases: Secondary | ICD-10-CM | POA: Diagnosis not present

## 2016-04-11 DIAGNOSIS — E1140 Type 2 diabetes mellitus with diabetic neuropathy, unspecified: Secondary | ICD-10-CM | POA: Diagnosis not present

## 2016-04-11 DIAGNOSIS — I2510 Atherosclerotic heart disease of native coronary artery without angina pectoris: Secondary | ICD-10-CM | POA: Diagnosis not present

## 2016-04-11 DIAGNOSIS — M25562 Pain in left knee: Secondary | ICD-10-CM | POA: Diagnosis present

## 2016-04-11 DIAGNOSIS — Z951 Presence of aortocoronary bypass graft: Secondary | ICD-10-CM | POA: Diagnosis not present

## 2016-04-11 DIAGNOSIS — E785 Hyperlipidemia, unspecified: Secondary | ICD-10-CM | POA: Diagnosis not present

## 2016-04-11 DIAGNOSIS — M179 Osteoarthritis of knee, unspecified: Secondary | ICD-10-CM | POA: Diagnosis not present

## 2016-04-11 DIAGNOSIS — M1712 Unilateral primary osteoarthritis, left knee: Secondary | ICD-10-CM | POA: Diagnosis not present

## 2016-04-11 DIAGNOSIS — Z8249 Family history of ischemic heart disease and other diseases of the circulatory system: Secondary | ICD-10-CM

## 2016-04-11 DIAGNOSIS — Z87891 Personal history of nicotine dependence: Secondary | ICD-10-CM

## 2016-04-11 DIAGNOSIS — Z96659 Presence of unspecified artificial knee joint: Secondary | ICD-10-CM

## 2016-04-11 DIAGNOSIS — G8918 Other acute postprocedural pain: Secondary | ICD-10-CM | POA: Diagnosis not present

## 2016-04-11 HISTORY — DX: Presence of unspecified artificial knee joint: Z96.659

## 2016-04-11 HISTORY — PX: TOTAL KNEE ARTHROPLASTY: SHX125

## 2016-04-11 HISTORY — DX: Prediabetes: R73.03

## 2016-04-11 LAB — GLUCOSE, CAPILLARY
GLUCOSE-CAPILLARY: 103 mg/dL — AB (ref 65–99)
Glucose-Capillary: 95 mg/dL (ref 65–99)

## 2016-04-11 SURGERY — ARTHROPLASTY, KNEE, TOTAL
Anesthesia: Spinal | Site: Knee | Laterality: Left

## 2016-04-11 MED ORDER — PROMETHAZINE HCL 25 MG/ML IJ SOLN: 6.2500 mg | INTRAMUSCULAR | Status: DC | PRN

## 2016-04-11 MED ORDER — DEXAMETHASONE SODIUM PHOSPHATE 10 MG/ML IJ SOLN
10.0000 mg | Freq: Once | INTRAMUSCULAR | Status: AC
  Administered 2016-04-12: 10 mg via INTRAVENOUS
  Filled 2016-04-11: qty 1

## 2016-04-11 MED ORDER — CLOPIDOGREL BISULFATE 75 MG PO TABS
75.0000 mg | ORAL_TABLET | Freq: Every day | ORAL | Status: DC
Start: 2016-04-12 — End: 2016-04-12
  Administered 2016-04-12: 75 mg via ORAL
  Filled 2016-04-11: qty 1

## 2016-04-11 MED ORDER — SODIUM CHLORIDE 0.9 % IV SOLN: INTRAVENOUS | Status: DC

## 2016-04-11 MED ORDER — CEFAZOLIN IN D5W 1 GM/50ML IV SOLN
1.0000 g | Freq: Four times a day (QID) | INTRAVENOUS | Status: AC
  Administered 2016-04-11 (×2): 1 g via INTRAVENOUS
  Filled 2016-04-11 (×3): qty 50

## 2016-04-11 MED ORDER — HYDROMORPHONE HCL 1 MG/ML IJ SOLN
1.0000 mg | INTRAMUSCULAR | Status: DC | PRN
  Administered 2016-04-11: 1 mg via INTRAVENOUS
  Filled 2016-04-11: qty 1

## 2016-04-11 MED ORDER — PANTOPRAZOLE SODIUM 40 MG PO TBEC
40.0000 mg | DELAYED_RELEASE_TABLET | Freq: Every day | ORAL | Status: DC
Start: 2016-04-12 — End: 2016-04-12
  Administered 2016-04-12: 40 mg via ORAL
  Filled 2016-04-11: qty 1

## 2016-04-11 MED ORDER — HYDROMORPHONE HCL 1 MG/ML IJ SOLN: 0.2500 mg | INTRAMUSCULAR | Status: DC | PRN

## 2016-04-11 MED ORDER — METHOCARBAMOL 500 MG PO TABS: 500.0000 mg | ORAL_TABLET | Freq: Four times a day (QID) | ORAL | Status: DC | PRN

## 2016-04-11 MED ORDER — NITROGLYCERIN 0.4 MG SL SUBL: 0.4000 mg | SUBLINGUAL_TABLET | SUBLINGUAL | Status: DC | PRN

## 2016-04-11 MED ORDER — PHENOL 1.4 % MT LIQD: 1.0000 | OROMUCOSAL | Status: DC | PRN

## 2016-04-11 MED ORDER — OXYCODONE HCL 5 MG PO TABS: 5.0000 mg | ORAL_TABLET | ORAL | Status: DC | PRN

## 2016-04-11 MED ORDER — BUPIVACAINE IN DEXTROSE 0.75-8.25 % IT SOLN
INTRATHECAL | Status: DC | PRN
  Administered 2016-04-11: 15 mg via INTRATHECAL

## 2016-04-11 MED ORDER — SODIUM CHLORIDE 0.9 % IR SOLN
Status: DC | PRN
  Administered 2016-04-11: 3000 mL

## 2016-04-11 MED ORDER — METOCLOPRAMIDE HCL 5 MG/ML IJ SOLN: 5.0000 mg | Freq: Three times a day (TID) | INTRAMUSCULAR | Status: DC | PRN

## 2016-04-11 MED ORDER — FENTANYL CITRATE (PF) 100 MCG/2ML IJ SOLN
100.0000 ug | Freq: Once | INTRAMUSCULAR | Status: AC
  Administered 2016-04-11: 100 ug via INTRAVENOUS
  Filled 2016-04-11: qty 2

## 2016-04-11 MED ORDER — MIDAZOLAM HCL 2 MG/2ML IJ SOLN
INTRAMUSCULAR | Status: AC
  Administered 2016-04-11: 1 mg
  Filled 2016-04-11: qty 2

## 2016-04-11 MED ORDER — CHLORHEXIDINE GLUCONATE 4 % EX LIQD: 60.0000 mL | Freq: Once | CUTANEOUS | Status: DC

## 2016-04-11 MED ORDER — ACETAMINOPHEN 325 MG PO TABS: 650.0000 mg | ORAL_TABLET | Freq: Four times a day (QID) | ORAL | Status: DC | PRN

## 2016-04-11 MED ORDER — MEPERIDINE HCL 25 MG/ML IJ SOLN: 6.2500 mg | INTRAMUSCULAR | Status: DC | PRN

## 2016-04-11 MED ORDER — FLEET ENEMA 7-19 GM/118ML RE ENEM: 1.0000 | ENEMA | Freq: Once | RECTAL | Status: DC | PRN

## 2016-04-11 MED ORDER — MIDAZOLAM HCL 2 MG/2ML IJ SOLN: 0.5000 mg | Freq: Once | INTRAMUSCULAR | Status: DC | PRN

## 2016-04-11 MED ORDER — ROSUVASTATIN CALCIUM 5 MG PO TABS
5.0000 mg | ORAL_TABLET | Freq: Every day | ORAL | Status: DC
Start: 2016-04-12 — End: 2016-04-12
  Administered 2016-04-12: 5 mg via ORAL
  Filled 2016-04-11: qty 1

## 2016-04-11 MED ORDER — FENTANYL CITRATE (PF) 100 MCG/2ML IJ SOLN
INTRAMUSCULAR | Status: DC | PRN
  Administered 2016-04-11: 25 ug via INTRAVENOUS

## 2016-04-11 MED ORDER — METHOCARBAMOL 1000 MG/10ML IJ SOLN
500.0000 mg | Freq: Four times a day (QID) | INTRAMUSCULAR | Status: DC | PRN
  Filled 2016-04-11: qty 5

## 2016-04-11 MED ORDER — FENTANYL CITRATE (PF) 100 MCG/2ML IJ SOLN
INTRAMUSCULAR | Status: AC
  Filled 2016-04-11: qty 2

## 2016-04-11 MED ORDER — SENNOSIDES-DOCUSATE SODIUM 8.6-50 MG PO TABS: 1.0000 | ORAL_TABLET | Freq: Every evening | ORAL | Status: DC | PRN

## 2016-04-11 MED ORDER — ACETAMINOPHEN 500 MG PO TABS
1000.0000 mg | ORAL_TABLET | Freq: Once | ORAL | Status: AC
  Administered 2016-04-11: 1000 mg via ORAL
  Filled 2016-04-11: qty 2

## 2016-04-11 MED ORDER — ROPIVACAINE HCL 7.5 MG/ML IJ SOLN
INTRAMUSCULAR | Status: DC | PRN
  Administered 2016-04-11: 20 mL via PERINEURAL

## 2016-04-11 MED ORDER — BUPIVACAINE-EPINEPHRINE (PF) 0.25% -1:200000 IJ SOLN
INTRAMUSCULAR | Status: AC
  Filled 2016-04-11: qty 30

## 2016-04-11 MED ORDER — BUPIVACAINE LIPOSOME 1.3 % IJ SUSP
20.0000 mL | INTRAMUSCULAR | Status: DC
  Filled 2016-04-11: qty 20

## 2016-04-11 MED ORDER — METOCLOPRAMIDE HCL 5 MG PO TABS: 5.0000 mg | ORAL_TABLET | Freq: Three times a day (TID) | ORAL | Status: DC | PRN

## 2016-04-11 MED ORDER — DEXAMETHASONE SODIUM PHOSPHATE 10 MG/ML IJ SOLN: 8.0000 mg | Freq: Once | INTRAMUSCULAR | Status: DC

## 2016-04-11 MED ORDER — ACETAMINOPHEN 650 MG RE SUPP: 650.0000 mg | Freq: Four times a day (QID) | RECTAL | Status: DC | PRN

## 2016-04-11 MED ORDER — DIPHENHYDRAMINE HCL 50 MG/ML IJ SOLN
INTRAMUSCULAR | Status: DC | PRN
  Administered 2016-04-11: 15 mg via INTRAVENOUS

## 2016-04-11 MED ORDER — CEFAZOLIN SODIUM-DEXTROSE 2-4 GM/100ML-% IV SOLN
2.0000 g | INTRAVENOUS | Status: AC
  Administered 2016-04-11: 2 g via INTRAVENOUS
  Filled 2016-04-11: qty 100

## 2016-04-11 MED ORDER — ALUM & MAG HYDROXIDE-SIMETH 200-200-20 MG/5ML PO SUSP: 30.0000 mL | ORAL | Status: DC | PRN

## 2016-04-11 MED ORDER — DOCUSATE SODIUM 100 MG PO CAPS
100.0000 mg | ORAL_CAPSULE | Freq: Two times a day (BID) | ORAL | Status: DC
  Administered 2016-04-11 – 2016-04-12 (×2): 100 mg via ORAL
  Filled 2016-04-11 (×2): qty 1

## 2016-04-11 MED ORDER — DIPHENHYDRAMINE HCL 12.5 MG/5ML PO ELIX
12.5000 mg | ORAL_SOLUTION | ORAL | Status: DC | PRN
Start: 2016-04-11 — End: 2016-04-12

## 2016-04-11 MED ORDER — TRANEXAMIC ACID 1000 MG/10ML IV SOLN
1000.0000 mg | Freq: Once | INTRAVENOUS | Status: AC
  Administered 2016-04-11: 1000 mg via INTRAVENOUS
  Filled 2016-04-11: qty 10

## 2016-04-11 MED ORDER — LIDOCAINE 2% (20 MG/ML) 5 ML SYRINGE
INTRAMUSCULAR | Status: AC
  Filled 2016-04-11: qty 10

## 2016-04-11 MED ORDER — SODIUM CHLORIDE 0.9 % IV SOLN
INTRAVENOUS | Status: DC
  Administered 2016-04-11 – 2016-04-12 (×2): 75 mL/h via INTRAVENOUS

## 2016-04-11 MED ORDER — METOPROLOL TARTRATE 25 MG PO TABS
25.0000 mg | ORAL_TABLET | Freq: Two times a day (BID) | ORAL | Status: DC
  Administered 2016-04-11 – 2016-04-12 (×2): 25 mg via ORAL
  Filled 2016-04-11 (×2): qty 1

## 2016-04-11 MED ORDER — MENTHOL 3 MG MT LOZG: 1.0000 | LOZENGE | OROMUCOSAL | Status: DC | PRN

## 2016-04-11 MED ORDER — GABAPENTIN 300 MG PO CAPS
300.0000 mg | ORAL_CAPSULE | Freq: Three times a day (TID) | ORAL | Status: DC
  Administered 2016-04-11 – 2016-04-12 (×3): 300 mg via ORAL
  Filled 2016-04-11 (×3): qty 1

## 2016-04-11 MED ORDER — MIDAZOLAM HCL 2 MG/2ML IJ SOLN
2.0000 mg | Freq: Once | INTRAMUSCULAR | Status: DC
  Filled 2016-04-11: qty 2

## 2016-04-11 MED ORDER — SODIUM CHLORIDE 0.9 % IJ SOLN
INTRAMUSCULAR | Status: DC | PRN
  Administered 2016-04-11: 20 mL

## 2016-04-11 MED ORDER — ROCURONIUM BROMIDE 10 MG/ML (PF) SYRINGE
PREFILLED_SYRINGE | INTRAVENOUS | Status: AC
  Filled 2016-04-11: qty 10

## 2016-04-11 MED ORDER — LISINOPRIL 10 MG PO TABS
10.0000 mg | ORAL_TABLET | Freq: Every day | ORAL | Status: DC
Start: 2016-04-12 — End: 2016-04-12
  Administered 2016-04-12: 10 mg via ORAL
  Filled 2016-04-11: qty 1

## 2016-04-11 MED ORDER — ONDANSETRON HCL 4 MG PO TABS: 4.0000 mg | ORAL_TABLET | Freq: Four times a day (QID) | ORAL | Status: DC | PRN

## 2016-04-11 MED ORDER — BUPIVACAINE LIPOSOME 1.3 % IJ SUSP
INTRAMUSCULAR | Status: DC | PRN
  Administered 2016-04-11: 20 mL

## 2016-04-11 MED ORDER — ISOSORBIDE MONONITRATE ER 30 MG PO TB24
30.0000 mg | ORAL_TABLET | Freq: Every day | ORAL | Status: DC
  Administered 2016-04-11 – 2016-04-12 (×2): 30 mg via ORAL
  Filled 2016-04-11 (×2): qty 1

## 2016-04-11 MED ORDER — PROPOFOL 500 MG/50ML IV EMUL
INTRAVENOUS | Status: DC | PRN
  Administered 2016-04-11: 30 ug/kg/min via INTRAVENOUS

## 2016-04-11 MED ORDER — OXYCODONE HCL ER 10 MG PO T12A
10.0000 mg | EXTENDED_RELEASE_TABLET | Freq: Two times a day (BID) | ORAL | Status: DC
Start: 2016-04-11 — End: 2016-04-12
  Administered 2016-04-11 – 2016-04-12 (×2): 10 mg via ORAL
  Filled 2016-04-11 (×2): qty 1

## 2016-04-11 MED ORDER — BUPIVACAINE-EPINEPHRINE (PF) 0.25% -1:200000 IJ SOLN
INTRAMUSCULAR | Status: DC | PRN
  Administered 2016-04-11: 30 mL

## 2016-04-11 MED ORDER — ZOLPIDEM TARTRATE 5 MG PO TABS: 5.0000 mg | ORAL_TABLET | Freq: Every evening | ORAL | Status: DC | PRN

## 2016-04-11 MED ORDER — ASPIRIN EC 325 MG PO TBEC
325.0000 mg | DELAYED_RELEASE_TABLET | Freq: Two times a day (BID) | ORAL | Status: DC
  Administered 2016-04-11 – 2016-04-12 (×2): 325 mg via ORAL
  Filled 2016-04-11 (×2): qty 1

## 2016-04-11 MED ORDER — LACTATED RINGERS IV SOLN
INTRAVENOUS | Status: DC
  Administered 2016-04-11 (×2): via INTRAVENOUS

## 2016-04-11 MED ORDER — DIPHENHYDRAMINE HCL 50 MG/ML IJ SOLN
INTRAMUSCULAR | Status: AC
  Filled 2016-04-11: qty 2

## 2016-04-11 MED ORDER — BISACODYL 5 MG PO TBEC: 5.0000 mg | DELAYED_RELEASE_TABLET | Freq: Every day | ORAL | Status: DC | PRN

## 2016-04-11 MED ORDER — ONDANSETRON HCL 4 MG/2ML IJ SOLN: 4.0000 mg | Freq: Four times a day (QID) | INTRAMUSCULAR | Status: DC | PRN

## 2016-04-11 SURGICAL SUPPLY — 57 items
BANDAGE ELASTIC 6 VELCRO ST LF (GAUZE/BANDAGES/DRESSINGS) ×2 IMPLANT
BANDAGE ESMARK 6X9 LF (GAUZE/BANDAGES/DRESSINGS) ×1 IMPLANT
BLADE PATELLA REAM PILOT HOLE (BLADE) ×2 IMPLANT
BLADE SAGITTAL 13X1.27X60 (BLADE) ×2 IMPLANT
BLADE SAW SGTL 83.5X18.5 (BLADE) ×2 IMPLANT
BLADE SURG 10 STRL SS (BLADE) ×2 IMPLANT
BNDG CMPR 9X6 STRL LF SNTH (GAUZE/BANDAGES/DRESSINGS) ×1
BNDG ESMARK 6X9 LF (GAUZE/BANDAGES/DRESSINGS) ×2
BOWL SMART MIX CTS (DISPOSABLE) ×2 IMPLANT
CAPT KNEE TOTAL 3 ×2 IMPLANT
CEMENT BONE SIMPLEX SPEEDSET (Cement) ×2 IMPLANT
COVER SURGICAL LIGHT HANDLE (MISCELLANEOUS) ×2 IMPLANT
CUFF TOURNIQUET SINGLE 24IN (TOURNIQUET CUFF) ×2 IMPLANT
DRAPE EXTREMITY T 121X128X90 (DRAPE) ×2 IMPLANT
DRAPE INCISE IOBAN 66X45 STRL (DRAPES) ×6 IMPLANT
DRAPE U-SHAPE 47X51 STRL (DRAPES) ×2 IMPLANT
DRSG AQUACEL AG ADV 3.5X10 (GAUZE/BANDAGES/DRESSINGS) ×2 IMPLANT
DURAPREP 26ML APPLICATOR (WOUND CARE) ×2 IMPLANT
ELECT REM PT RETURN 9FT ADLT (ELECTROSURGICAL) ×2
ELECTRODE REM PT RTRN 9FT ADLT (ELECTROSURGICAL) ×1 IMPLANT
GLOVE BIOGEL M 7.0 STRL (GLOVE) ×2 IMPLANT
GLOVE BIOGEL PI IND STRL 7.5 (GLOVE) ×1 IMPLANT
GLOVE BIOGEL PI IND STRL 8.5 (GLOVE) ×2 IMPLANT
GLOVE BIOGEL PI INDICATOR 7.5 (GLOVE) ×1
GLOVE BIOGEL PI INDICATOR 8.5 (GLOVE) ×2
GLOVE SURG ORTHO 8.0 STRL STRW (GLOVE) ×8 IMPLANT
GLOVE SURG SS PI 6.5 STRL IVOR (GLOVE) ×2 IMPLANT
GLOVE SURG SS PI 7.5 STRL IVOR (GLOVE) ×4 IMPLANT
GOWN STRL REUS W/ TWL LRG LVL3 (GOWN DISPOSABLE) ×1 IMPLANT
GOWN STRL REUS W/ TWL XL LVL3 (GOWN DISPOSABLE) ×2 IMPLANT
GOWN STRL REUS W/TWL 2XL LVL3 (GOWN DISPOSABLE) ×2 IMPLANT
GOWN STRL REUS W/TWL LRG LVL3 (GOWN DISPOSABLE) ×2
GOWN STRL REUS W/TWL XL LVL3 (GOWN DISPOSABLE) ×4
HANDPIECE INTERPULSE COAX TIP (DISPOSABLE) ×2
HOOD PEEL AWAY FACE SHEILD DIS (HOOD) ×6 IMPLANT
KIT BASIN OR (CUSTOM PROCEDURE TRAY) ×2 IMPLANT
KIT ROOM TURNOVER OR (KITS) ×2 IMPLANT
KNEE CAPITATED TOTAL 3 ×1 IMPLANT
MANIFOLD NEPTUNE II (INSTRUMENTS) ×2 IMPLANT
NEEDLE 22X1 1/2 (OR ONLY) (NEEDLE) ×4 IMPLANT
NS IRRIG 1000ML POUR BTL (IV SOLUTION) ×2 IMPLANT
PACK TOTAL JOINT (CUSTOM PROCEDURE TRAY) ×2 IMPLANT
PACK UNIVERSAL I (CUSTOM PROCEDURE TRAY) ×2 IMPLANT
PAD ARMBOARD 7.5X6 YLW CONV (MISCELLANEOUS) ×4 IMPLANT
SET HNDPC FAN SPRY TIP SCT (DISPOSABLE) ×1 IMPLANT
STRIP CLOSURE SKIN 1/2X4 (GAUZE/BANDAGES/DRESSINGS) ×4 IMPLANT
SUT BONE WAX W31G (SUTURE) ×2 IMPLANT
SUT MNCRL AB 4-0 PS2 18 (SUTURE) ×2 IMPLANT
SUT VIC AB 0 CTB1 27 (SUTURE) ×4 IMPLANT
SUT VIC AB 1 CT1 27 (SUTURE) ×4
SUT VIC AB 1 CT1 27XBRD ANBCTR (SUTURE) ×2 IMPLANT
SUT VIC AB 2-0 CT1 27 (SUTURE) ×4
SUT VIC AB 2-0 CT1 TAPERPNT 27 (SUTURE) ×2 IMPLANT
SYR 20CC LL (SYRINGE) ×4 IMPLANT
TOWEL OR 17X24 6PK STRL BLUE (TOWEL DISPOSABLE) ×2 IMPLANT
TOWEL OR 17X26 10 PK STRL BLUE (TOWEL DISPOSABLE) ×2 IMPLANT
WATER STERILE IRR 1000ML POUR (IV SOLUTION) ×4 IMPLANT

## 2016-04-11 NOTE — Transfer of Care (Signed)
Immediate Anesthesia Transfer of Care Note  Patient: [name]  Procedure(s) Performed: Procedure(s): LEFT TOTAL KNEE ARTHROPLASTY (Left)  Patient Location: PACU  Anesthesia Type:Spinal  Level of Consciousness: awake, alert  and oriented  Airway & Oxygen Therapy: Patient Spontanous Breathing and Patient connected to nasal cannula oxygen  Post-op Assessment: Report given to RN and Post -op Vital signs reviewed and stable  Post vital signs: Reviewed and stable  Last Vitals:  Vitals:   04/11/16 0848 04/11/16 1156  BP: (!) 188/92 108/63  Pulse: 66 (!) 55  Resp: 18 16  Temp: 36.8 C 36.4 C    Last Pain:  Vitals:   04/11/16 0848  TempSrc: Oral      Patients Stated Pain Goal: 3 ([id number])  Complications: No apparent anesthesia complications

## 2016-04-11 NOTE — H&P (Signed)
[name] MRN:  [id number] DOB/SEX:  08-Apr-1935/male  CHIEF COMPLAINT:  Painful left Knee  HISTORY: Patient is a 81 y.o. male presented with a history of pain in the left knee. Onset of symptoms was gradual starting a few years ago with gradually worsening course since that time. Patient has been treated conservatively with over-the-counter NSAIDs and activity modification. Patient currently rates pain in the knee at 10 out of 10 with activity. There is pain at night.  PAST MEDICAL HISTORY: Patient Active Problem List   Diagnosis Date Noted  . Bilateral lower abdominal pain 02/05/2016  . Coronary artery disease involving native coronary artery of native heart without angina pectoris   . S/P CABG x 4 11/03/2014  . Exertional angina ([facility]) 10/31/2014  . Neuropathy in diabetes ([place]) 03/07/2014  . Low back pain 03/07/2014  . Subdural hematoma ([place]) 01/18/2014  . Diabetes with neurologic complications ([place]) [id number]  . Hyperlipidemia 11/26/2007  . HTN (hypertension) 11/26/2007  . CORONARY ARTERY DISEASE 11/26/2007  . GERD 11/26/2007  . DEGENERATIVE JOINT DISEASE 11/26/2007  . PERCUTANEOUS TRANSLUMINAL CORONARY ANGIOPLASTY, HX OF 11/26/2007   Past Medical History:  Diagnosis Date  . Coronary atherosclerosis of unspecified type of vessel, native or graft    s/p stent mid LAD 2005  . Depression   . DJD (degenerative joint disease)   . DM (diabetes mellitus) ([place])   . GERD (gastroesophageal reflux disease)   . History of hiatal hernia   . HTN (hypertension)   . Hyperlipidemia    Past Surgical History:  Procedure Laterality Date  . BRAIN SURGERY    . CARDIAC CATHETERIZATION    . CORONARY ARTERY BYPASS GRAFT N/A 11/03/2014   Procedure: CORONARY ARTERY BYPASS GRAFTING (CABG), ON PUMP, TIMES FOUR, USING LEFT INTERNAL MAMMARY, RIGHT GREATER SAPHENOUS VEIN HARVESTED ENDOSCOPICALLY;  Surgeon: Ivin Poot, MD;  Location: [place];  Service: Open Heart Surgery;  Laterality: N/A;   LIMA-LAD; SVG-DIAG; SVG-OM; SVG-RCA  . CRANIOTOMY N/A 01/18/2014   Procedure: CRANIOTOMY HEMATOMA EVACUATION SUBDURAL;  Surgeon: Hosie Spangle, MD;  Location: [place];  Service: Neurosurgery;  Laterality: N/A;  . kidney stone removal    . KNEE SURGERY    . LEFT HEART CATHETERIZATION WITH CORONARY ANGIOGRAM N/A 07/27/2011   Procedure: LEFT HEART CATHETERIZATION WITH CORONARY ANGIOGRAM;  Surgeon: Burnell Blanks, MD;  Location: [facility] CATH LAB;  Service: Cardiovascular;  Laterality: N/A;  . LEFT HEART CATHETERIZATION WITH CORONARY ANGIOGRAM N/A 10/31/2014   Procedure: LEFT HEART CATHETERIZATION WITH CORONARY ANGIOGRAM;  Surgeon: Sherren Mocha, MD;  Location: [facility] CATH LAB;  Service: Cardiovascular;  Laterality: N/A;  . PTCA     Hx of it.   Marland Kitchen SIGMOIDOSCOPY    . TEE WITHOUT CARDIOVERSION N/A 11/03/2014   Procedure: TRANSESOPHAGEAL ECHOCARDIOGRAM (TEE);  Surgeon: Ivin Poot, MD;  Location: [place];  Service: Open Heart Surgery;  Laterality: N/A;  . TOTAL KNEE ARTHROPLASTY  02/2011   Right knee     MEDICATIONS:   No prescriptions prior to admission.    ALLERGIES:   Allergies  Allergen Reactions  . Protamine Rash    Hypotension, Rash, unstable vitals.  Hypotension, Rash, unstable vitals.   . Statins Other (See Comments)    Caused soreness (Lipitor, Zocor)  . Rosuvastatin Calcium Other (See Comments)    Caused soreness (Lipitor, Zocor)  . Imodium [Loperamide] Itching and Rash    REVIEW OF SYSTEMS:  A comprehensive review of systems was negative except for: Musculoskeletal: positive for arthralgias  FAMILY HISTORY:   Family History  Problem Relation Age of Onset  . Heart disease Father   . Heart attack Father   . Heart failure Mother   . COPD Mother   . Heart disease Sister   . Heart attack Sister   . Heart attack Brother     SOCIAL HISTORY:   Social History  Substance Use Topics  . Smoking status: Former Research scientist (life sciences)  . Smokeless tobacco: Former Systems developer     Comment: quit in  1985. smoked 1ppd for 35 years   . Alcohol use No     EXAMINATION:  Vital signs in last 24 hours:    There were no vitals taken for this visit.  General Appearance:    Alert, cooperative, no distress, appears stated age  Head:    Normocephalic, without obvious abnormality, atraumatic  Eyes:    PERRL, conjunctiva/corneas clear, EOM's intact, fundi    benign, both eyes  Ears:    Normal TM's and external ear canals, both ears  Nose:   Nares normal, septum midline, mucosa normal, no drainage    or sinus tenderness  Throat:   Lips, mucosa, and tongue normal; teeth and gums normal  Neck:   Supple, symmetrical, trachea midline, no adenopathy;    thyroid:  no enlargement/tenderness/nodules; no carotid   bruit or JVD  Back:     Symmetric, no curvature, ROM normal, no CVA tenderness  Lungs:     Clear to auscultation bilaterally, respirations unlabored  Chest Wall:    No tenderness or deformity   Heart:    Regular rate and rhythm, S1 and S2 normal, no murmur, rub   or gallop     Abdomen:     Soft, non-tender, bowel sounds active all four quadrants,    no masses, no organomegaly  Genitalia:    Normal male without lesion, discharge or tenderness  Rectal:    Normal tone, no masses or tenderness;   guaiac negative stool  Extremities:   Extremities normal, atraumatic, no cyanosis or edema  Pulses:   2+ and symmetric all extremities  Skin:   Skin color, texture, turgor normal, no rashes or lesions  Lymph nodes:   Cervical, supraclavicular, and axillary nodes normal  Neurologic:   CNII-XII intact, normal strength, sensation and reflexes    throughout     Musculoskeletal:  ROM 0-120, Ligaments intact,  Imaging Review Plain radiographs demonstrate severe degenerative joint disease of the left knee. The overall alignment is neutral. The bone quality appears to be excellent for age and reported activity level.  Assessment/Plan: Primary osteoarthritis, left knee   The patient history,  physical examination and imaging studies are consistent with advanced degenerative joint disease of the left knee. The patient has failed conservative treatment.  The clearance notes were reviewed.  After discussion with the patient it was felt that Total Knee Replacement was indicated. The procedure,  risks, and benefits of total knee arthroplasty were presented and reviewed. The risks including but not limited to aseptic loosening, infection, blood clots, vascular injury, stiffness, patella tracking problems complications among others were discussed. The patient acknowledged the explanation, agreed to proceed with the plan. Donia Ast 04/11/2016, 6:33 AM

## 2016-04-11 NOTE — Anesthesia Procedure Notes (Signed)
Procedure Name: MAC Date/Time: 04/11/2016 9:54 AM Performed by: Mariea Clonts Pre-anesthesia Checklist: Patient identified, Emergency Drugs available, Suction available, Patient being monitored and Timeout performed Patient Re-evaluated:Patient Re-evaluated prior to inductionOxygen Delivery Method: Nasal cannula

## 2016-04-11 NOTE — Anesthesia Procedure Notes (Signed)
Anesthesia Regional Block:  Adductor canal block  Pre-Anesthetic Checklist: ,, timeout performed, Correct Patient, Correct Site, Correct Laterality, Correct Procedure, Correct Position, site marked, Risks and benefits discussed,  Surgical consent,  Pre-op evaluation,  At surgeon's request and post-op pain management  Laterality: Lower and Left  Prep: chloraprep       Needles:   Needle Type: Echogenic Needle     Needle Length: 9cm 9 cm Needle Gauge: 22 and 22 G    Additional Needles:  Procedures: ultrasound guided (picture in chart) Adductor canal block Narrative:  Start time: 04/11/2016 9:15 AM End time: 04/11/2016 9:23 AM Injection made incrementally with aspirations every 5 mL.  Performed by: Personally  Anesthesiologist: Glennon Mac, Addison Whidbee  Additional Notes: Pt identified in Holding room.  Monitors applied. Working IV access confirmed. Sterile prep, drape L thigh.  #22ga ECHOgenic needle into adductor canal with US guidance.  20cc 0.75% Ropivacaine injected incrementally after negative test dose.  Patient asymptomatic, VSS, no heme aspirated, tolerated well.  Jenita Seashore, MD

## 2016-04-11 NOTE — Progress Notes (Signed)
Patient arrived on the unit on a hospital bed from OR, assessment completed see flowsheet, patient oriented to room and staff, call light within reach, bed in low position, will continue to monitor

## 2016-04-11 NOTE — Anesthesia Procedure Notes (Signed)
Spinal  Patient location during procedure: OR End time: 04/11/2016 10:00 AM Staffing Anesthesiologist: Annye Asa Performed: anesthesiologist  Preanesthetic Checklist Completed: patient identified, site marked, surgical consent, pre-op evaluation, timeout performed, IV checked, risks and benefits discussed and monitors and equipment checked Spinal Block Patient position: sitting Prep: ChloraPrep and site prepped and draped Patient monitoring: heart rate, cardiac monitor, continuous pulse ox and blood pressure Approach: midline Location: L3-4 Injection technique: single-shot Needle Needle type: Quincke  Needle gauge: 25 G Needle length: 9 cm Additional Notes Pt identified in Operating room.  Monitors applied. Working IV access confirmed. Sterile prep, drape lumbar spine.  1% lido local L 3,4.  #25ga Quincke into clear CSF L 3,4.  15mg  0.75% Bupivacaine with dextrose injected with asp CSF beginning and end of injection.  Patient asymptomatic, VSS, no heme aspirated, tolerated well.  Jenita Seashore, MD

## 2016-04-11 NOTE — Progress Notes (Signed)
Orthopedic Tech Progress Note Patient Details:  [name] Jul 08, 1935 :[id number]  CPM Left Knee CPM Left Knee: On Left Knee Flexion (Degrees): 90 Left Knee Extension (Degrees): 0 Additional Comments: Trapeze bar and foot roll   [name] 04/11/2016, 12:23 PM

## 2016-04-11 NOTE — Anesthesia Preprocedure Evaluation (Addendum)
Anesthesia Evaluation  Patient identified by MRN, date of birth, ID band Patient awake    Reviewed: Allergy & Precautions, NPO status , Patient's Chart, lab work & pertinent test results, reviewed documented beta blocker date and time   History of Anesthesia Complications Negative for: history of anesthetic complications  Airway Mallampati: II  TM Distance: >3 FB Neck ROM: Full    Dental  (+) Edentulous Upper, Partial Lower, Dental Advisory Given   Pulmonary former smoker,    breath sounds clear to auscultation       Cardiovascular hypertension, Pt. on medications and Pt. on home beta blockers (-) angina+ CAD, + Cardiac Stents and + CABG   Rhythm:Regular Rate:Normal  4/17 ECHO: EF 60-65%, valves OK   Neuro/Psych negative neurological ROS     GI/Hepatic Neg liver ROS, GERD  Medicated and Controlled,  Endo/Other  negative endocrine ROS  Renal/GU negative Renal ROS     Musculoskeletal  (+) Arthritis , Osteoarthritis,    Abdominal   Peds  Hematology  (+) Blood dyscrasia (off plavix since 9/24), ,   Anesthesia Other Findings   Reproductive/Obstetrics                            Anesthesia Physical Anesthesia Plan  ASA: III  Anesthesia Plan: Spinal   Post-op Pain Management:  Regional for Post-op pain   Induction:   Airway Management Planned: Natural Airway and Simple Face Mask  Additional Equipment:   Intra-op Plan:   Post-operative Plan:   Informed Consent: I have reviewed the patients History and Physical, chart, labs and discussed the procedure including the risks, benefits and alternatives for the proposed anesthesia with the patient or authorized representative who has indicated his/her understanding and acceptance.   Dental advisory given  Plan Discussed with: CRNA and Surgeon  Anesthesia Plan Comments: (Plan routine monitors, SAB with adductor canal block for post op  analgesia)        Anesthesia Quick Evaluation

## 2016-04-12 ENCOUNTER — Encounter (HOSPITAL_COMMUNITY): Payer: Self-pay | Admitting: Orthopedic Surgery

## 2016-04-12 DIAGNOSIS — M1712 Unilateral primary osteoarthritis, left knee: Secondary | ICD-10-CM | POA: Diagnosis not present

## 2016-04-12 LAB — CBC
HCT: 30.8 % — ABNORMAL LOW (ref 39.0–52.0)
Hemoglobin: 9.5 g/dL — ABNORMAL LOW (ref 13.0–17.0)
MCH: 25 pg — AB (ref 26.0–34.0)
MCHC: 30.8 g/dL (ref 30.0–36.0)
MCV: 81.1 fL (ref 78.0–100.0)
PLATELETS: 209 10*3/uL (ref 150–400)
RBC: 3.8 MIL/uL — AB (ref 4.22–5.81)
RDW: 17.4 % — ABNORMAL HIGH (ref 11.5–15.5)
WBC: 9.2 10*3/uL (ref 4.0–10.5)

## 2016-04-12 LAB — BASIC METABOLIC PANEL
ANION GAP: 7 (ref 5–15)
BUN: 8 mg/dL (ref 6–20)
CO2: 25 mmol/L (ref 22–32)
Calcium: 8.4 mg/dL — ABNORMAL LOW (ref 8.9–10.3)
Chloride: 105 mmol/L (ref 101–111)
Creatinine, Ser: 0.75 mg/dL (ref 0.61–1.24)
GFR calc Af Amer: >60 mL/min (ref >60)
Glucose, Bld: 131 mg/dL — ABNORMAL HIGH (ref 65–99)
POTASSIUM: 3.5 mmol/L (ref 3.5–5.1)
SODIUM: 137 mmol/L (ref 135–145)

## 2016-04-12 MED ORDER — ONDANSETRON HCL 4 MG PO TABS: 4.0000 mg | ORAL_TABLET | Freq: Four times a day (QID) | ORAL | 0 refills | Status: DC | PRN

## 2016-04-12 MED ORDER — METHOCARBAMOL 500 MG PO TABS: 500.0000 mg | ORAL_TABLET | Freq: Four times a day (QID) | ORAL | 0 refills | Status: DC | PRN

## 2016-04-12 MED ORDER — ASPIRIN 325 MG PO TBEC: 325.0000 mg | DELAYED_RELEASE_TABLET | Freq: Two times a day (BID) | ORAL | 0 refills | Status: DC

## 2016-04-12 MED ORDER — OXYCODONE HCL 5 MG PO TABS: 5.0000 mg | ORAL_TABLET | ORAL | 0 refills | Status: DC | PRN

## 2016-04-12 NOTE — Discharge Summary (Signed)
SPORTS MEDICINE & JOINT REPLACEMENT   Lara Mulch, MD   Carlyon Shadow, PA-C [address], [place], [place]  [address]                             [phone]  PATIENT ID: [name]        MRN:  [place]:[id number]          DOB/AGE: 79/10/1934 / 81 y.o.    DISCHARGE SUMMARY  ADMISSION DATE:    04/11/2016 DISCHARGE DATE:   04/12/2016   ADMISSION DIAGNOSIS: primary osteoarthritis left knee    DISCHARGE DIAGNOSIS:  severe osteoarthritis left knee    ADDITIONAL DIAGNOSIS: Active Problems:   S/P total knee replacement  Past Medical History:  Diagnosis Date  . Coronary atherosclerosis of unspecified type of vessel, native or graft    s/p stent mid LAD 2005  . Depression   . DJD (degenerative joint disease)   . GERD (gastroesophageal reflux disease)   . History of hiatal hernia   . HTN (hypertension)   . Hyperlipidemia   . Pre-diabetes     PROCEDURE: Procedure(s): LEFT TOTAL KNEE ARTHROPLASTY on 04/11/2016  CONSULTS:    HISTORY:  See H&P in chart  HOSPITAL COURSE:  IGNACIO GIANNI is a 81 y.o. admitted on 04/11/2016 and found to have a diagnosis of severe osteoarthritis left knee.  After appropriate laboratory studies were obtained  they were taken to the operating room on 04/11/2016 and underwent Procedure(s): LEFT TOTAL KNEE ARTHROPLASTY.   They were given perioperative antibiotics:  Anti-infectives    Start     Dose/Rate Route Frequency Ordered Stop   04/11/16 1630  ceFAZolin (ANCEF) IVPB 1 g/50 mL premix     1 g 100 mL/hr over 30 Minutes Intravenous Every 6 hours 04/11/16 1622 04/11/16 2321   04/11/16 0759  ceFAZolin (ANCEF) IVPB 2g/100 mL premix     2 g 200 mL/hr over 30 Minutes Intravenous On call to O.R. 04/11/16 BY:[id number] 04/11/16 0956    .  Patient given tranexamic acid IV or topical and exparel intra-operatively.  Tolerated the procedure well.    POD# 1: Vital signs were stable.  Patient denied Chest pain, shortness of breath, or calf pain.  Patient was started  on Lovenox 30 mg subcutaneously twice daily at 8am.  Consults to PT, OT, and care management were made.  The patient was weight bearing as tolerated.  CPM was placed on the operative leg 0-90 degrees for 6-8 hours a day. When out of the CPM, patient was placed in the foam block to achieve full extension. Incentive spirometry was taught.  Dressing was changed.       POD #2, Continued  PT for ambulation and exercise program.  IV saline locked.  O2 discontinued.    The remainder of the hospital course was dedicated to ambulation and strengthening.   The patient was discharged on 1 Day Post-Op in  Good condition.  Blood products given:none  DIAGNOSTIC STUDIES: Recent vital signs: Patient Vitals for the past 24 hrs:  BP Temp Temp src Pulse Resp SpO2  04/12/16 1300 (!) 124/48 98.1 F (36.7 C) Oral 80 16 99 %  04/12/16 0525 (!) 99/51 98.4 F (36.9 C) Oral 79 - 99 %  04/11/16 2118 (!) 153/73 98 F (36.7 C) Oral 77 16 99 %  04/11/16 1729 (!) 166/79 98.1 F (36.7 C) Oral 68 16 99 %  04/11/16 1545 (!) 169/84 - -  66 18 100 %  04/11/16 1530 (!) 163/90 97.6 F (36.4 C) - 71 (!) 28 100 %  04/11/16 1515 (!) 163/88 - - 64 19 99 %       Recent laboratory studies:  Recent Labs  04/12/16 0519  WBC 9.2  HGB 9.5*  HCT 30.8*  PLT 209    Recent Labs  04/12/16 0519  NA 137  K 3.5  CL 105  CO2 25  BUN 8  CREATININE 0.75  GLUCOSE 131*  CALCIUM 8.4*   Lab Results  Component Value Date   INR 1.08 03/31/2016   INR 1.47 11/03/2014   INR 1.0 10/27/2014     Recent Radiographic Studies :  Dg Chest 2 View  Result Date: 03/31/2016 CLINICAL DATA:  Fell knee arthroplasty.  Preop testing. EXAM: CHEST  2 VIEW COMPARISON:  12/17/2014 FINDINGS: Stable normal heart size. Status post CABG and coronary stenting. Moderate hiatal hernia. There is no edema, consolidation, effusion, or pneumothorax. Remote and healed lateral left clavicle fracture. IMPRESSION: No active disease. Hiatal hernia. Coronary  atherosclerosis. Electronically Signed   By: Monte Fantasia M.D.   On: 03/31/2016 16:06    DISCHARGE INSTRUCTIONS: Discharge Instructions    CPM    Complete by:  As directed    Continuous passive motion machine (CPM):      Use the CPM from 0 to 90 for 4-6 hours per day.      You may increase by 10 per day.  You may break it up into 2 or 3 sessions per day.      Use CPM for 2 weeks or until you are told to stop.   Call MD / Call 911    Complete by:  As directed    If you experience chest pain or shortness of breath, CALL 911 and be transported to the hospital emergency room.  If you develope a fever above 101 F, pus (white drainage) or increased drainage or redness at the wound, or calf pain, call your surgeon's office.   Constipation Prevention    Complete by:  As directed    Drink plenty of fluids.  Prune juice may be helpful.  You may use a stool softener, such as Colace (over the counter) 100 mg twice a day.  Use MiraLax (over the counter) for constipation as needed.   Diet - low sodium heart healthy    Complete by:  As directed    Discharge instructions    Complete by:  As directed    INSTRUCTIONS AFTER JOINT REPLACEMENT   Remove items at home which could result in a fall. This includes throw rugs or furniture in walking pathways ICE to the affected joint every three hours while awake for 30 minutes at a time, for at least the first 3-5 days, and then as needed for pain and swelling.  Continue to use ice for pain and swelling. You may notice swelling that will progress down to the foot and ankle.  This is normal after surgery.  Elevate your leg when you are not up walking on it.   Continue to use the breathing machine you got in the hospital (incentive spirometer) which will help keep your temperature down.  It is common for your temperature to cycle up and down following surgery, especially at night when you are not up moving around and exerting yourself.  The breathing machine keeps  your lungs expanded and your temperature down.   DIET:  As you were doing prior  to hospitalization, we recommend a well-balanced diet.  DRESSING / WOUND CARE / SHOWERING  Keep the surgical dressing until follow up.  The dressing is water proof, so you can shower without any extra covering.  IF THE DRESSING FALLS OFF or the wound gets wet inside, change the dressing with sterile gauze.  Please use good hand washing techniques before changing the dressing.  Do not use any lotions or creams on the incision until instructed by your surgeon.    ACTIVITY  Increase activity slowly as tolerated, but follow the weight bearing instructions below.   No driving for 6 weeks or until further direction given by your physician.  You cannot drive while taking narcotics.  No lifting or carrying greater than 10 lbs. until further directed by your surgeon. Avoid periods of inactivity such as sitting longer than an hour when not asleep. This helps prevent blood clots.  You may return to work once you are authorized by your doctor.     WEIGHT BEARING   Weight bearing as tolerated with assist device (walker, cane, etc) as directed, use it as long as suggested by your surgeon or therapist, typically at least 4-6 weeks.   EXERCISES  Results after joint replacement surgery are often greatly improved when you follow the exercise, range of motion and muscle strengthening exercises prescribed by your doctor. Safety measures are also important to protect the joint from further injury. Any time any of these exercises cause you to have increased pain or swelling, decrease what you are doing until you are comfortable again and then slowly increase them. If you have problems or questions, call your caregiver or physical therapist for advice.   Rehabilitation is important following a joint replacement. After just a few days of immobilization, the muscles of the leg can become weakened and shrink (atrophy).  These exercises  are designed to build up the tone and strength of the thigh and leg muscles and to improve motion. Often times heat used for twenty to thirty minutes before working out will loosen up your tissues and help with improving the range of motion but do not use heat for the first two weeks following surgery (sometimes heat can increase post-operative swelling).   These exercises can be done on a training (exercise) mat, on the floor, on a table or on a bed. Use whatever works the best and is most comfortable for you.    Use music or television while you are exercising so that the exercises are a pleasant break in your day. This will make your life better with the exercises acting as a break in your routine that you can look forward to.   Perform all exercises about fifteen times, three times per day or as directed.  You should exercise both the operative leg and the other leg as well.   Exercises include:   Quad Sets - Tighten up the muscle on the front of the thigh (Quad) and hold for 5-10 seconds.   Straight Leg Raises - With your knee straight (if you were given a brace, keep it on), lift the leg to 60 degrees, hold for 3 seconds, and slowly lower the leg.  Perform this exercise against resistance later as your leg gets stronger.  Leg Slides: Lying on your back, slowly slide your foot toward your buttocks, bending your knee up off the floor (only go as far as is comfortable). Then slowly slide your foot back down until your leg is flat on the floor  again.  G[place]ard Haring Wings: Lying on your back spread your legs to the side as far apart as you can without causing discomfort.  Hamstring Strength:  Lying on your back, push your heel against the floor with your leg straight by tightening up the muscles of your buttocks.  Repeat, but this time bend your knee to a comfortable angle, and push your heel against the floor.  You may put a pillow under the heel to make it more comfortable if necessary.   A rehabilitation  program following joint replacement surgery can speed recovery and prevent re-injury in the future due to weakened muscles. Contact your doctor or a physical therapist for more information on knee rehabilitation.    CONSTIPATION  Constipation is defined medically as fewer than three stools per week and severe constipation as less than one stool per week.  Even if you have a regular bowel pattern at home, your normal regimen is likely to be disrupted due to multiple reasons following surgery.  Combination of anesthesia, postoperative narcotics, change in appetite and fluid intake all can affect your bowels.   YOU MUST use at least one of the following options; they are listed in order of increasing strength to get the job done.  They are all available over the counter, and you may need to use some, POSSIBLY even all of these options:    Drink plenty of fluids (prune juice may be helpful) and high fiber foods Colace 100 mg by mouth twice a day  Senokot for constipation as directed and as needed Dulcolax (bisacodyl), take with full glass of water  Miralax (polyethylene glycol) once or twice a day as needed.  If you have tried all these things and are unable to have a bowel movement in the first 3-4 days after surgery call either your surgeon or your primary doctor.    If you experience loose stools or diarrhea, hold the medications until you stool forms back up.  If your symptoms do not get better within 1 week or if they get worse, check with your doctor.  If you experience "the worst abdominal pain ever" or develop nausea or vomiting, please contact the office immediately for further recommendations for treatment.   ITCHING:  If you experience itching with your medications, try taking only a single pain pill, or even half a pain pill at a time.  You can also use Benadryl over the counter for itching or also to help with sleep.   TED HOSE STOCKINGS:  Use stockings on both legs until for at least 2  weeks or as directed by physician office. They may be removed at night for sleeping.  MEDICATIONS:  See your medication summary on the "After Visit Summary" that nursing will review with you.  You may have some home medications which will be placed on hold until you complete the course of blood thinner medication.  It is important for you to complete the blood thinner medication as prescribed.  PRECAUTIONS:  If you experience chest pain or shortness of breath - call 911 immediately for transfer to the hospital emergency department.   If you develop a fever greater that 101 F, purulent drainage from wound, increased redness or drainage from wound, foul odor from the wound/dressing, or calf pain - CONTACT YOUR SURGEON.  FOLLOW-UP APPOINTMENTS:  If you do not already have a post-op appointment, please call the office for an appointment to be seen by your surgeon.  Guidelines for how soon to be seen are listed in your "After Visit Summary", but are typically between 1-4 weeks after surgery.  OTHER INSTRUCTIONS:   Knee Replacement:  Do not place pillow under knee, focus on keeping the knee straight while resting. CPM instructions: 0-90 degrees, 2 hours in the morning, 2 hours in the afternoon, and 2 hours in the evening. Place foam block, curve side up under heel at all times except when in CPM or when walking.  DO NOT modify, tear, cut, or change the foam block in any way.  MAKE SURE YOU:  Understand these instructions.  Get help right away if you are not doing well or get worse.    Thank you for letting us be a part of your medical care team.  It is a privilege we respect greatly.  We hope these instructions will help you stay on track for a fast and full recovery!   Increase activity slowly as tolerated    Complete by:  As directed       DISCHARGE MEDICATIONS:     Medication List    TAKE these medications   acetaminophen 500 MG  tablet Commonly known as:  TYLENOL Take 1,000 mg by mouth 2 (two) times daily.   aspirin 325 MG EC tablet Take 1 tablet (325 mg total) by mouth 2 (two) times daily.   clopidogrel 75 MG tablet Commonly known as:  PLAVIX TAKE ONE TABLET BY MOUTH ONCE DAILY   isosorbide mononitrate 30 MG 24 hr tablet Commonly known as:  IMDUR Take 1 tablet (30 mg total) by mouth daily.   lisinopril 10 MG tablet Commonly known as:  PRINIVIL,ZESTRIL Take 1 tablet (10 mg total) by mouth daily.   methocarbamol 500 MG tablet Commonly known as:  ROBAXIN Take 1-2 tablets (500-1,000 mg total) by mouth every 6 (six) hours as needed for muscle spasms.   metoprolol tartrate 25 MG tablet Commonly known as:  LOPRESSOR TAKE ONE AND ONE-HALF TABLETS BY MOUTH TWICE A DAY What changed:  See the new instructions.   nitroGLYCERIN 0.4 MG SL tablet Commonly known as:  NITROSTAT Place 0.4 mg under the tongue every 5 (five) minutes as needed for chest pain.   ondansetron 4 MG tablet Commonly known as:  ZOFRAN Take 1 tablet (4 mg total) by mouth every 6 (six) hours as needed for nausea.   oxyCODONE 5 MG immediate release tablet Commonly known as:  Oxy IR/ROXICODONE Take 1-2 tablets (5-10 mg total) by mouth every 3 (three) hours as needed for breakthrough pain.   pantoprazole 40 MG tablet Commonly known as:  PROTONIX Take 1 tablet (40 mg total) by mouth daily.   rosuvastatin 5 MG tablet Commonly known as:  CRESTOR Take 1 tablet (5 mg total) by mouth daily.       FOLLOW UP VISIT:   Follow-up Information    [place] .   Specialty:  [place] Why:  Someone from [facility] will contact you to arrange start date and time for therapy. Contact information: [place] [address] [phone]           DISPOSITION: HOME VS. SNF  CONDITION:  Good   Donia Ast 04/12/2016, 3:03 PM

## 2016-04-12 NOTE — Progress Notes (Signed)
SPORTS MEDICINE AND JOINT REPLACEMENT  Lara Mulch, MD    Carlyon Shadow, PA-C [address], [place], [place]  [address]                             [phone]   PROGRESS NOTE  Subjective:  negative for Chest Pain  negative for Shortness of Breath  negative for Nausea/Vomiting   negative for Calf Pain  negative for Bowel Movement   Tolerating Diet: yes         Patient reports pain as 5 on 0-10 scale.    Objective: Vital signs in last 24 hours:   Patient Vitals for the past 24 hrs:  BP Temp Temp src Pulse Resp SpO2 Weight  04/12/16 0525 (!) 99/51 98.4 F (36.9 C) Oral 79 - 99 % -  04/11/16 2118 (!) 153/73 98 F (36.7 C) Oral 77 16 99 % -  04/11/16 1729 (!) 166/79 98.1 F (36.7 C) Oral 68 16 99 % -  04/11/16 1545 (!) 169/84 - - 66 18 100 % -  04/11/16 1530 (!) 163/90 97.6 F (36.4 C) - 71 (!) 28 100 % -  04/11/16 1515 (!) 163/88 - - 64 19 99 % -  04/11/16 1500 (!) 164/85 - - 61 19 100 % -  04/11/16 1445 (!) 166/89 - - (!) 59 20 100 % -  04/11/16 1430 (!) 163/88 - - (!) 59 18 100 % -  04/11/16 1415 (!) 165/87 - - (!) 57 14 100 % -  04/11/16 1400 (!) 167/91 - - (!) 56 14 100 % -  04/11/16 1345 (!) 162/89 - - (!) 59 15 100 % -  04/11/16 1330 (!) 162/92 - - 61 11 100 % -  04/11/16 1315 (!) 142/79 - - (!) 49 15 100 % -  04/11/16 1300 (!) 141/76 - - (!) 48 14 100 % -  04/11/16 1230 128/67 - - (!) 51 15 100 % -  04/11/16 1215 123/66 - - (!) 53 15 99 % -  04/11/16 1156 108/63 97.6 F (36.4 C) - (!) 55 16 100 % -  04/11/16 0945 (!) 151/76 - - (!) 54 11 98 % -  04/11/16 0930 (!) 158/68 - - (!) 56 14 93 % -  04/11/16 0915 (!) 181/78 - - (!) 59 13 99 % -  04/11/16 0848 (!) 188/92 98.2 F (36.8 C) Oral 66 18 96 % -  04/11/16 0823 - - - - - - 85.7 kg (189 lb)    @flow {1959:LAST@   Intake/Output from previous day:   10/02 0701 - 10/03 0700 In: 1020 [P.O.:120; I.V.:900] Out: 1250 [Urine:1100]   Intake/Output this shift:   10/02 1901 - 10/03 0700 In: -  Out:  1100 [Urine:1100]   Intake/Output      10/02 0701 - 10/03 0700   P.O. 120   I.V. (mL/kg) 900 (10.5)   Total Intake(mL/kg) 1020 (11.9)   Urine (mL/kg/hr) 1100   Blood 150   Total Output 1250   Net -230          LABORATORY DATA:  Recent Labs  04/12/16 0519  WBC 9.2  HGB 9.5*  HCT 30.8*  PLT 209    Recent Labs  04/12/16 0519  NA 137  K 3.5  CL 105  CO2 25  BUN 8  CREATININE 0.75  GLUCOSE 131*  CALCIUM 8.4*   Lab Results  Component Value Date  INR 1.08 03/31/2016   INR 1.47 11/03/2014   INR 1.0 10/27/2014    Examination:  General appearance: alert, cooperative and no distress Extremities: extremities normal, atraumatic, no cyanosis or edema  Wound Exam: clean, dry, intact   Drainage:  None: wound tissue dry  Motor Exam: Quadriceps and Hamstrings Intact  Sensory Exam: Superficial Peroneal, Deep Peroneal and Tibial normal   Assessment:    1 Day Post-Op  Procedure(s) (LRB): LEFT TOTAL KNEE ARTHROPLASTY (Left)  ADDITIONAL DIAGNOSIS:  Active Problems:   S/P total knee replacement     Plan: Physical Therapy as ordered Weight Bearing as Tolerated (WBAT)  DVT Prophylaxis:  Aspirin  DISCHARGE PLAN: Home  DISCHARGE NEEDS: HHPT   Patient doing great, anticipate D/C today         Donia Ast 04/12/2016, 6:57 AM

## 2016-04-12 NOTE — Care Management Note (Signed)
Case Management Note  Patient Details  Name: [name] MRN: [id number] Date of Birth: 1935/03/21  Subjective/Objective:    81 yr old male s/p left total knee arthroplasty.                Action/Plan: Case manager spoke with patient's wife concerning [place] agency, choice was offered. Patient's wife initially requested an agency that doesn't accept [place], was agreeable to [facility]. Case Manager called referral to [place], [facility] Liaison. Patient has DME  At the home and will have family support.    Expected Discharge Date:    04/12/16              Expected Discharge Plan:  [place]  In-House Referral:     Discharge planning Services  CM Consult  Post Acute Care Choice:  Home Health Choice offered to:  Patient, Spouse  DME Arranged:  N/A DME Agency:  [facility]  HH Arranged:  PT [place] Agency:  [facility]  Status of Service:  Completed, signed off  If discussed at [place] of Stay Meetings, dates discussed:    Additional Comments:  Ninfa Meeker, RN 04/12/2016, 2:15 PM

## 2016-04-12 NOTE — Evaluation (Signed)
Physical Therapy Evaluation Patient Details Name: [name] MRN: :[id number] DOB: 1935-03-28 Today's Date: 04/12/2016   History of Present Illness  Patient is a 81 y/o male with hx of CABG, SDH, HTN, HLD, depression, presents s/p Left TKA.   Clinical Impression  Patient presents with pain and post surgical deficits s/p above surgery. Tolerated taking a few steps within room to chair with Min A for balance/safety. Cues for sequencing and RW proximity. Limited by pain. Education re: knee precautions, zero degree knee, exercises etc. Pt will have support from spouse and granddaughter at home. Pt has 8 stairs to negotiate to get to bedroom and not sure pt will be ready to do those this afternoon. Will follow acutely to maximize independence and mobility prior to return home.     Follow Up Recommendations Home health PT;Supervision/Assistance - 24 hour;Supervision for mobility/OOB    Equipment Recommendations  None recommended by PT    Recommendations for Other Services       Precautions / Restrictions Precautions Precautions: Fall;Knee Precaution Booklet Issued: No Precaution Comments: Reviewed no pillow under knee and precautions Restrictions Weight Bearing Restrictions: Yes LLE Weight Bearing: Weight bearing as tolerated      Mobility  Bed Mobility Overal bed mobility: Needs Assistance Bed Mobility: Supine to Sit     Supine to sit: Min guard;HOB elevated     General bed mobility comments: No assist needed to get to EOB, use of rail. + dizziness.  Transfers Overall transfer level: Needs assistance Equipment used: Rolling walker (2 wheeled) Transfers: Sit to/from Stand Sit to Stand: Min guard         General transfer comment: Min guard for safety. Stood from [employer]. Transferred to chair post ambulation.  Ambulation/Gait   Ambulation Distance (Feet): 8 Feet Assistive device: Rolling walker (2 wheeled) Gait Pattern/deviations: Step-to pattern;Decreased stance time  - left;Decreased step length - right;Decreased step length - left;Trunk flexed Gait velocity: decreased Gait velocity interpretation: Below normal speed for age/gender General Gait Details: Cues for gait sequencing and RW positioning as pt with RW too far anterior. Limited by pain and wanting to eat breakfast.    Stairs            Wheelchair Mobility    Modified Rankin (Stroke Patients Only)       Balance Overall balance assessment: Needs assistance Sitting-balance support: Feet supported;No upper extremity supported Sitting balance-Leahy Scale: Fair     Standing balance support: During functional activity;Bilateral upper extremity supported Standing balance-Leahy Scale: Poor Standing balance comment: Reliant on BUEs for support in standing.                              Pertinent Vitals/Pain Pain Assessment: Faces Faces Pain Scale: Hurts whole lot Pain Location: left knee Pain Descriptors / Indicators: Sore;Operative site guarding;Aching Pain Intervention(s): Monitored during session;Repositioned;Limited activity within patient's tolerance;Patient requesting pain meds-RN notified    Home Living Family/patient expects to be discharged to:: Private residence Living Arrangements: Spouse/significant other;Other relatives (granddaughter) Available Help at Discharge: Family;Available 24 hours/day Type of Home: House Home Access: Stairs to enter   [employer] of Steps: 1 Home Layout: Multi-level Home Equipment: Cane - single point;Walker - 2 wheels;Bedside commode      Prior Function Level of Independence: Independent with assistive device(s)         Comments: Uses SPC for ambulation as needed; furniture walker inside     Hand Dominance  Dominant Hand: Right    Extremity/Trunk Assessment   Upper Extremity Assessment: Defer to OT evaluation           Lower Extremity Assessment: LLE deficits/detail   LLE Deficits / Details:  Limited AROM/strength secondary to pain and post op.     Communication   Communication: HOH (wears heading aides)  Cognition Arousal/Alertness: Awake/alert Behavior During Therapy: WFL for tasks assessed/performed Overall Cognitive Status: Within Functional Limits for tasks assessed                      General Comments      Exercises Total Joint Exercises Ankle Circles/Pumps: Both;10 reps;Supine Quad Sets: Both;10 reps;Supine   Assessment/Plan    PT Assessment Patient needs continued PT services  PT Problem List Decreased strength;Decreased mobility;Decreased range of motion;Decreased activity tolerance;Decreased balance;Decreased knowledge of use of DME;Pain          PT Treatment Interventions DME instruction;Therapeutic activities;Gait training;Therapeutic exercise;Patient/family education;Balance training;Stair training;Functional mobility training    PT Goals (Current goals can be found in the Care Plan section)  Acute Rehab PT Goals Patient Stated Goal: to go home today PT Goal Formulation: With patient Time For Goal Achievement: 04/26/16 Potential to Achieve Goals: Fair    Frequency 7X/week   Barriers to discharge Inaccessible home environment 8 stairs to get to bedroom.    Co-evaluation               End of Session Equipment Utilized During Treatment: Gait belt Activity Tolerance: Patient limited by pain Patient left: in chair;with call bell/phone within reach Nurse Communication: Mobility status         Time: [id number] PT Time Calculation (min) (ACUTE ONLY): 19 min   Charges:   PT Evaluation $PT Eval Moderate Complexity: 1 Procedure     PT G Codes:        Kathrina Crosley A Abygail Galeno 04/12/2016, 8:09 AM Wray Kearns, PT, DPT [phone]

## 2016-04-12 NOTE — Progress Notes (Signed)
Orthopedic Tech Progress Note Patient Details:  [name] [birth year] [place]:[id number]  [name], male   DOB: December 13, 1934, 80 y.o.   MRN: [id number] Applied cpm 0-60  Karolee Stamps 04/12/2016, 5:56 AM

## 2016-04-12 NOTE — Progress Notes (Signed)
Physical Therapy Treatment Patient Details Name: [name] MRN: [id number] DOB: Dec 03, 1934 Today's Date: 04/12/2016    History of Present Illness Patient is a 81 y/o male with hx of CABG, SDH, HTN, HLD, depression, presents s/p Left TKA.     PT Comments    Patient progressing slowly towards PT goals. Second session focused on stair training and gait training. Requires Mod A to ascend stairs due to weakness/safety. Informed family about level of assist needed and to minimize stair negotiation for the first couple days. Pt could really benefit from an additional night to improve safety with mobility but adamant about going home today. Encouraged assist with stairs at home and mobility. Will follow if still in hospital tomorrow however planning to d/c soon.    Follow Up Recommendations  Home health PT;Supervision/Assistance - 24 hour;Supervision for mobility/OOB     Equipment Recommendations  None recommended by PT    Recommendations for Other Services       Precautions / Restrictions Precautions Precautions: Fall;Knee Precaution Booklet Issued: No Precaution Comments: Reviewed no pillow under knee and precautions Restrictions Weight Bearing Restrictions: Yes LLE Weight Bearing: Weight bearing as tolerated    Mobility  Bed Mobility               General bed mobility comments: Up in chair upon PT arrival.   Transfers Overall transfer level: Needs assistance Equipment used: Rolling walker (2 wheeled) Transfers: Sit to/from Stand Sit to Stand: Min guard         General transfer comment: Min guard for safety. Cues for hand placement/technique.  Ambulation/Gait   Ambulation Distance (Feet): 75 Feet Assistive device: Rolling walker (2 wheeled) Gait Pattern/deviations: Step-to pattern;Decreased stance time - left;Decreased step length - right;Trunk flexed Gait velocity: decreased Gait velocity interpretation: Below normal speed for age/gender General Gait  Details: Cues for step through gait and knee extension during stance phase. Cues for RW proximity and upright posture. Left knee instability noted.    Stairs Stairs: Yes Stairs assistance: Mod assist Stair Management: One rail Right;Step to pattern Number of Stairs: 2 General stair comments: Mod A to ascend steps with rail on 1 side and therapist HHA on other side. Discussed with family need for assist with negotating steps at home and to minimize stairs until strength improves.   Wheelchair Mobility    Modified Rankin (Stroke Patients Only)       Balance Overall balance assessment: Needs assistance Sitting-balance support: Feet supported;No upper extremity supported Sitting balance-Leahy Scale: Good     Standing balance support: During functional activity Standing balance-Leahy Scale: Poor                      Cognition Arousal/Alertness: Awake/alert Behavior During Therapy: WFL for tasks assessed/performed Overall Cognitive Status: Within Functional Limits for tasks assessed                      Exercises Total Joint Exercises Quad Sets: Both;10 reps;Seated Heel Slides: Left;10 reps;Seated Goniometric ROM: 15-75 degrees knee AROM    General Comments General comments (skin integrity, edema, etc.): Family in room during session.      Pertinent Vitals/Pain Pain Assessment: Faces Faces Pain Scale: Hurts even more Pain Location: left knee Pain Descriptors / Indicators: Sore;Operative site guarding;Grimacing;Guarding Pain Intervention(s): Monitored during session;Repositioned;Premedicated before session    [place] expects to be discharged to:: Private residence Living Arrangements: Spouse/significant other;Other relatives (Granddaughter) Available Help at Discharge: Family;Available 24 hours/day  Type of Home: House Home Access: Stairs to enter   Home Layout: Multi-level Home Equipment: Kasandra Knudsen - single point;Walker - 2  wheels;Bedside commode      Prior Function Level of Independence: Independent with assistive device(s)      Comments: Uses SPC for ambulation as needed; furniture walker inside   PT Goals (current goals can now be found in the care plan section) Progress towards PT goals: Progressing toward goals    Frequency    7X/week      PT Plan Current plan remains appropriate    Co-evaluation             End of Session Equipment Utilized During Treatment: Gait belt Activity Tolerance: Patient limited by pain Patient left: in chair;with call bell/phone within reach;with family/visitor present     Time: [id number] PT Time Calculation (min) (ACUTE ONLY): 23 min  Charges:  $Gait Training: 23-37 mins                    G Codes:      Simi Briel A Yakelin Grenier 04/12/2016, 12:21 PM Wray Kearns, [place], DPT [phone]

## 2016-04-12 NOTE — Op Note (Signed)
TOTAL KNEE REPLACEMENT OPERATIVE NOTE:  04/11/2016  4:02 PM  PATIENT:  [name]  81 y.o. male  PRE-OPERATIVE DIAGNOSIS:  severe osteoarthritis left knee  POST-OPERATIVE DIAGNOSIS:  severe osteoarthritis left knee  PROCEDURE:  Procedure(s): LEFT TOTAL KNEE ARTHROPLASTY  SURGEON:  Surgeon(s): Vickey Huger, MD  PHYSICIAN ASSISTANT: Carlyon Shadow, [facility]   ANESTHESIA:   spinal  DRAINS: Hemovac  SPECIMEN: None  COUNTS:  Correct  TOURNIQUET:   Total Tourniquet Time Documented: Thigh (Left) - 49 minutes Total: Thigh (Left) - 49 minutes   DICTATION:  Indication for procedure:    The patient is a 81 y.o. male who has failed conservative treatment for severe osteoarthritis left knee.  Informed consent was obtained prior to anesthesia. The risks versus benefits of the operation were explain and in a way the patient can, and did, understand.   On the implant demand matching protocol, this patient scored 9.  Therefore, this patient was not receive a polyethylene insert with vitamin E which is a high demand implant.  Description of procedure:     The patient was taken to the operating room and placed under anesthesia.  The patient was positioned in the usual fashion taking care that all body parts were adequately padded and/or protected.  I foley catheter was not placed.  A tourniquet was applied and the leg prepped and draped in the usual sterile fashion.  The extremity was exsanguinated with the esmarch and tourniquet inflated to 350 mmHg.  Pre-operative range of motion was normal.  The knee was in 5 degree of mild varus.  A midline incision approximately 6-7 inches long was made with a #10 blade.  A new blade was used to make a parapatellar arthrotomy going 2-3 cm into the quadriceps tendon, over the patella, and alongside the medial aspect of the patellar tendon.  A synovectomy was then performed with the #10 blade and forceps. I then elevated the deep MCL off the medial tibial  metaphysis subperiosteally around to the semimembranosus attachment.    I everted the patella and used calipers to measure patellar thickness.  I used the reamer to ream down to appropriate thickness to recreate the native thickness.  I then removed excess bone with the rongeur and sagittal saw.  I used the appropriately sized template and drilled the three lug holes.  I then put the trial in place and measured the thickness with the calipers to ensure recreation of the native thickness.  The trial was then removed and the patella subluxed and the knee brought into flexion.  A homan retractor was place to retract and protect the patella and lateral structures.  A Z-retractor was place medially to protect the medial structures.  The extra-medullary alignment system was used to make cut the tibial articular surface perpendicular to the anamotic axis of the tibia and in 3 degrees of posterior slope.  The cut surface and alignment jig was removed.  I then used the intramedullary alignment guide to make a 6 valgus cut on the distal femur.  I then marked out the epicondylar axis on the distal femur.  The posterior condylar axis measured 3 degrees.  I then used the anterior referencing sizer and measured the femur to be a size 8.  The 4-In-1 cutting block was screwed into place in external rotation matching the posterior condylar angle, making our cuts perpendicular to the epicondylar axis.  Anterior, posterior and chamfer cuts were made with the sagittal saw.  The cutting block and cut  pieces were removed.  A lamina spreader was placed in 90 degrees of flexion.  The ACL, PCL, menisci, and posterior condylar osteophytes were removed.  A 10 mm spacer blocked was found to offer good flexion and extension gap balance after mild in degree releasing.   The scoop retractor was then placed and the femoral finishing block was pinned in place.  The small sagittal saw was used as well as the lug drill to finish the femur.   The block and cut surfaces were removed and the medullary canal hole filled with autograft bone from the cut pieces.  The tibia was delivered forward in deep flexion and external rotation.  A size E tray was selected and pinned into place centered on the medial 1/3 of the tibial tubercle.  The reamer and keel was used to prepare the tibia through the tray.    I then trialed with the size 8 femur, size E tibia, a 10 mm insert and the 32 patella.  I had excellent flexion/extension gap balance, excellent patella tracking.  Flexion was full and beyond 120 degrees; extension was zero.  These components were chosen and the staff opened them to me on the back table while the knee was lavaged copiously and the cement mixed.  The soft tissue was infiltrated with 60cc of exparel 1.3% through a 21 gauge needle.  I cemented in the components and removed all excess cement.  The polyethylene tibial component was snapped into place and the knee placed in extension while cement was hardening.  The capsule was infilltrated with 30cc of .25% Marcaine with epinephrine.  A hemovac was place in the joint exiting superolaterally.  A pain pump was place superomedially superficial to the arthrotomy.  Once the cement was hard, the tourniquet was let down.  Hemostasis was obtained.  The arthrotomy was closed with figure-8 #1 vicryl sutures.  The deep soft tissues were closed with #0 vicryls and the subcuticular layer closed with a running #2-0 vicryl.  The skin was reapproximated and closed with skin staples.  The wound was dressed with xeroform, 4 x4's, 2 ABD sponges, a single layer of webril and a TED stocking.   The patient was then awakened, extubated, and taken to the recovery room in stable condition.  BLOOD LOSS:  300cc DRAINS: 1 hemovac, 1 pain catheter COMPLICATIONS:  None.  PLAN OF CARE: Admit to inpatient   PATIENT DISPOSITION:  PACU - hemodynamically stable.   Delay start of Pharmacological VTE agent (>24hrs)  due to surgical blood loss or risk of bleeding:  not applicable  Please fax a copy of this op note to my office at [phone] (please only include page 1 and 2 of the Case Information op note)

## 2016-04-12 NOTE — Evaluation (Signed)
Occupational Therapy Evaluation and Discharge Patient Details Name: [name] MRN: [id number] DOB: 1935-02-26 Today's Date: 04/12/2016    History of Present Illness Patient is a 81 y/o male with hx of CABG, SDH, HTN, HLD, depression, presents s/p Left TKA.    Clinical Impression   PTA Pt was independent in self care tasks like bathing and dressing. Pt able to demonstrate LB dressing by don/doff socks without assistance from OT. Pt deficits listed below. Pt at level safe to d/c home with good support from family. Discussed safety (sitting for ADL with fatigue, and having assistance when transferring for bathing/showering). No acute care needs at this time.    Follow Up Recommendations  No OT follow up;Supervision/Assistance - 24 hour    Equipment Recommendations  None recommended by OT    Recommendations for Other Services       Precautions / Restrictions Precautions Precautions: Fall;Knee Precaution Comments: Reviewed no pillow under knee and precautions Restrictions Weight Bearing Restrictions: Yes LLE Weight Bearing: Weight bearing as tolerated      Mobility Bed Mobility Overal bed mobility: Needs Assistance Bed Mobility: Supine to Sit     Supine to sit: Min guard;HOB elevated     General bed mobility comments: No physical assist needed  Transfers Overall transfer level: Needs assistance Equipment used: Rolling walker (2 wheeled) Transfers: Sit to/from Stand Sit to Stand: Min guard         General transfer comment: Min guard for safety. Cues for hand placement/technique.    Balance Overall balance assessment: Needs assistance Sitting-balance support: Feet supported;No upper extremity supported Sitting balance-Leahy Scale: Good     Standing balance support: During functional activity;Bilateral upper extremity supported Standing balance-Leahy Scale: Poor Standing balance comment: use of BUE on RW for support, and edge of sink for support during  ADL                            ADL Overall ADL's : Needs assistance/impaired Eating/Feeding: Set up;Sitting   Grooming: Wash/dry hands;Min guard;Standing Grooming Details (indicate cue type and reason): sink level             Lower Body Dressing: Min guard;Sit to/from stand;With caregiver independent assisting Lower Body Dressing Details (indicate cue type and reason): Able to doff/don socks, educated in [place] (Pt's wife had knee replacement before and were familiar) Toilet Transfer: Minimal assistance;Ambulation;BSC;RW Toilet Transfer Details (indicate cue type and reason): to go slow down, with BSC over toilet for BUE support Toileting- Clothing Manipulation and Hygiene: Minimal assistance   Tub/ Shower Transfer: Walk-in shower;Min guard;Ambulation;Rolling walker Tub/Shower Transfer Details (indicate cue type and reason): simulated Functional mobility during ADLs: Min guard;Rolling walker General ADL Comments: Pt motivated to re-gain Haematologist      Pertinent Vitals/Pain Pain Assessment: Faces Faces Pain Scale: Hurts even more Pain Location: L knee Pain Descriptors / Indicators: Sore;Operative site guarding;Grimacing Pain Intervention(s): Limited activity within patient's tolerance;Repositioned;Premedicated before session     Hand Dominance Right   Extremity/Trunk Assessment Upper Extremity Assessment Upper Extremity Assessment: Overall WFL for tasks assessed   Lower Extremity Assessment Lower Extremity Assessment: LLE deficits/detail LLE Deficits / Details: Limited AROM/strength secondary to pain and post op.       Communication Communication Communication: HOH   Cognition Arousal/Alertness: Awake/alert Behavior During Therapy: WFL for tasks assessed/performed Overall Cognitive Status: Within Functional Limits for tasks  assessed                     General Comments       Exercises        Shoulder Instructions      Home Living Family/patient expects to be discharged to:: Private residence Living Arrangements: Spouse/significant other;Other relatives (Granddaughter) Available Help at Discharge: Family;Available 24 hours/day Type of Home: House Home Access: Stairs to enter [employer] of Steps: 1   Home Layout: Multi-level Alternate Level Stairs-Number of Steps: 7 Alternate Level Stairs-Rails: Right Bathroom Shower/Tub: Occupational psychologist: Handicapped height Bathroom Accessibility: Yes How Accessible: Accessible via walker Home Equipment: [place] - single point;Walker - 2 wheels;Bedside commode          Prior Functioning/Environment Level of Independence: Independent with assistive device(s)        Comments: Uses SPC for ambulation as needed; furniture walker inside        OT Problem List: Decreased strength;Decreased range of motion;Decreased activity tolerance;Impaired balance (sitting and/or standing);Pain;Decreased knowledge of use of DME or AE   OT Treatment/Interventions:      OT Goals(Current goals can be found in the care plan section) Acute Rehab OT Goals Patient Stated Goal: to go home today OT Goal Formulation: With patient/family Time For Goal Achievement: 04/19/16 Potential to Achieve Goals: Good  OT Frequency:     Barriers to D/C:            Co-evaluation              End of Session Equipment Utilized During Treatment: Gait belt;Rolling walker CPM Left Knee CPM Left Knee: Off Nurse Communication: Precautions  Activity Tolerance: Patient tolerated treatment well Patient left: in chair;with call bell/phone within reach;with family/visitor present   Time: 1000-1043 OT Time Calculation (min): 43 min Charges:  OT General Charges $OT Visit: 1 Procedure OT Evaluation $OT Eval Low Complexity: 1 Procedure OT Treatments $Self Care/Home Management : 23-37 mins G-Codes:    Merri Ray Euleta Belson 05/03/2016,  4:23 PM  Hulda Humphrey OTR/L [phone]

## 2016-04-13 DIAGNOSIS — Z9181 History of falling: Secondary | ICD-10-CM | POA: Diagnosis not present

## 2016-04-13 DIAGNOSIS — Z7982 Long term (current) use of aspirin: Secondary | ICD-10-CM | POA: Diagnosis not present

## 2016-04-13 DIAGNOSIS — E785 Hyperlipidemia, unspecified: Secondary | ICD-10-CM | POA: Diagnosis not present

## 2016-04-13 DIAGNOSIS — Z951 Presence of aortocoronary bypass graft: Secondary | ICD-10-CM | POA: Diagnosis not present

## 2016-04-13 DIAGNOSIS — K219 Gastro-esophageal reflux disease without esophagitis: Secondary | ICD-10-CM | POA: Diagnosis not present

## 2016-04-13 DIAGNOSIS — I10 Essential (primary) hypertension: Secondary | ICD-10-CM | POA: Diagnosis not present

## 2016-04-13 DIAGNOSIS — R69 Illness, unspecified: Secondary | ICD-10-CM | POA: Diagnosis not present

## 2016-04-13 DIAGNOSIS — Z96652 Presence of left artificial knee joint: Secondary | ICD-10-CM | POA: Diagnosis not present

## 2016-04-13 DIAGNOSIS — I2510 Atherosclerotic heart disease of native coronary artery without angina pectoris: Secondary | ICD-10-CM | POA: Diagnosis not present

## 2016-04-13 DIAGNOSIS — Z471 Aftercare following joint replacement surgery: Secondary | ICD-10-CM | POA: Diagnosis not present

## 2016-04-13 NOTE — Anesthesia Postprocedure Evaluation (Signed)
Anesthesia Post Note  Patient: [name]  Procedure(s) Performed: Procedure(s) (LRB): LEFT TOTAL KNEE ARTHROPLASTY (Left)  Patient location during evaluation: PACU Anesthesia Type: Spinal Level of consciousness: awake and alert, patient cooperative and oriented Pain management: pain level controlled Vital Signs Assessment: vitals unstable and post-procedure vital signs reviewed and stable Respiratory status: spontaneous breathing, nonlabored ventilation, respiratory function stable and patient connected to nasal cannula oxygen Cardiovascular status: stable and blood pressure returned to baseline Postop Assessment: spinal receding and no signs of nausea or vomiting Anesthetic complications: no    Last Vitals:  Vitals:   04/12/16 0525 04/12/16 1300  BP: (!) 99/51 (!) 124/48  Pulse: 79 80  Resp:  16  Temp: 36.9 C 36.7 C    Last Pain:  Vitals:   04/12/16 1300  TempSrc: Oral  PainSc:                  [name],[name]

## 2016-04-19 ENCOUNTER — Inpatient Hospital Stay (HOSPITAL_COMMUNITY)
Admission: EM | Admit: 2016-04-19 | Discharge: 2016-04-24 | DRG: 871 | Disposition: A | Discharge status: Home / Self Care | Payer: Medicare HMO | Attending: Internal Medicine | Admitting: Internal Medicine

## 2016-04-19 ENCOUNTER — Encounter (HOSPITAL_COMMUNITY): Payer: Self-pay

## 2016-04-19 ENCOUNTER — Emergency Department (HOSPITAL_COMMUNITY): Payer: Medicare HMO

## 2016-04-19 DIAGNOSIS — K219 Gastro-esophageal reflux disease without esophagitis: Secondary | ICD-10-CM | POA: Diagnosis present

## 2016-04-19 DIAGNOSIS — R7881 Bacteremia: Secondary | ICD-10-CM | POA: Diagnosis not present

## 2016-04-19 DIAGNOSIS — D649 Anemia, unspecified: Secondary | ICD-10-CM | POA: Diagnosis not present

## 2016-04-19 DIAGNOSIS — Z79899 Other long term (current) drug therapy: Secondary | ICD-10-CM | POA: Diagnosis not present

## 2016-04-19 DIAGNOSIS — A419 Sepsis, unspecified organism: Secondary | ICD-10-CM | POA: Diagnosis not present

## 2016-04-19 DIAGNOSIS — J9601 Acute respiratory failure with hypoxia: Secondary | ICD-10-CM | POA: Diagnosis present

## 2016-04-19 DIAGNOSIS — Z8249 Family history of ischemic heart disease and other diseases of the circulatory system: Secondary | ICD-10-CM | POA: Diagnosis not present

## 2016-04-19 DIAGNOSIS — A4151 Sepsis due to Escherichia coli [E. coli]: Secondary | ICD-10-CM | POA: Diagnosis present

## 2016-04-19 DIAGNOSIS — Z87891 Personal history of nicotine dependence: Secondary | ICD-10-CM

## 2016-04-19 DIAGNOSIS — E872 Acidosis: Secondary | ICD-10-CM | POA: Diagnosis present

## 2016-04-19 DIAGNOSIS — I2721 Secondary pulmonary arterial hypertension: Secondary | ICD-10-CM | POA: Diagnosis present

## 2016-04-19 DIAGNOSIS — D72829 Elevated white blood cell count, unspecified: Secondary | ICD-10-CM | POA: Diagnosis not present

## 2016-04-19 DIAGNOSIS — T501X5A Adverse effect of loop [high-ceiling] diuretics, initial encounter: Secondary | ICD-10-CM | POA: Diagnosis present

## 2016-04-19 DIAGNOSIS — N390 Urinary tract infection, site not specified: Secondary | ICD-10-CM | POA: Diagnosis not present

## 2016-04-19 DIAGNOSIS — R0602 Shortness of breath: Secondary | ICD-10-CM

## 2016-04-19 DIAGNOSIS — G9341 Metabolic encephalopathy: Secondary | ICD-10-CM | POA: Diagnosis not present

## 2016-04-19 DIAGNOSIS — I5032 Chronic diastolic (congestive) heart failure: Secondary | ICD-10-CM | POA: Diagnosis not present

## 2016-04-19 DIAGNOSIS — K449 Diaphragmatic hernia without obstruction or gangrene: Secondary | ICD-10-CM | POA: Diagnosis present

## 2016-04-19 DIAGNOSIS — Z951 Presence of aortocoronary bypass graft: Secondary | ICD-10-CM

## 2016-04-19 DIAGNOSIS — E785 Hyperlipidemia, unspecified: Secondary | ICD-10-CM | POA: Diagnosis present

## 2016-04-19 DIAGNOSIS — I10 Essential (primary) hypertension: Secondary | ICD-10-CM | POA: Diagnosis not present

## 2016-04-19 DIAGNOSIS — I2510 Atherosclerotic heart disease of native coronary artery without angina pectoris: Secondary | ICD-10-CM | POA: Diagnosis present

## 2016-04-19 DIAGNOSIS — I110 Hypertensive heart disease with heart failure: Secondary | ICD-10-CM | POA: Diagnosis present

## 2016-04-19 DIAGNOSIS — R079 Chest pain, unspecified: Secondary | ICD-10-CM | POA: Diagnosis not present

## 2016-04-19 DIAGNOSIS — Z23 Encounter for immunization: Secondary | ICD-10-CM | POA: Diagnosis not present

## 2016-04-19 DIAGNOSIS — Z7982 Long term (current) use of aspirin: Secondary | ICD-10-CM

## 2016-04-19 DIAGNOSIS — E119 Type 2 diabetes mellitus without complications: Secondary | ICD-10-CM | POA: Diagnosis present

## 2016-04-19 DIAGNOSIS — Z825 Family history of asthma and other chronic lower respiratory diseases: Secondary | ICD-10-CM | POA: Diagnosis not present

## 2016-04-19 DIAGNOSIS — Z9861 Coronary angioplasty status: Secondary | ICD-10-CM

## 2016-04-19 DIAGNOSIS — Z96652 Presence of left artificial knee joint: Secondary | ICD-10-CM | POA: Diagnosis present

## 2016-04-19 DIAGNOSIS — K828 Other specified diseases of gallbladder: Secondary | ICD-10-CM | POA: Diagnosis not present

## 2016-04-19 DIAGNOSIS — B961 Klebsiella pneumoniae [K. pneumoniae] as the cause of diseases classified elsewhere: Secondary | ICD-10-CM | POA: Diagnosis present

## 2016-04-19 DIAGNOSIS — G9340 Encephalopathy, unspecified: Secondary | ICD-10-CM | POA: Diagnosis present

## 2016-04-19 DIAGNOSIS — R7989 Other specified abnormal findings of blood chemistry: Secondary | ICD-10-CM | POA: Diagnosis not present

## 2016-04-19 DIAGNOSIS — Z7902 Long term (current) use of antithrombotics/antiplatelets: Secondary | ICD-10-CM

## 2016-04-19 DIAGNOSIS — R945 Abnormal results of liver function studies: Secondary | ICD-10-CM

## 2016-04-19 DIAGNOSIS — Z955 Presence of coronary angioplasty implant and graft: Secondary | ICD-10-CM

## 2016-04-19 DIAGNOSIS — E876 Hypokalemia: Secondary | ICD-10-CM | POA: Diagnosis present

## 2016-04-19 DIAGNOSIS — Z96659 Presence of unspecified artificial knee joint: Secondary | ICD-10-CM

## 2016-04-19 DIAGNOSIS — I152 Hypertension secondary to endocrine disorders: Secondary | ICD-10-CM | POA: Diagnosis present

## 2016-04-19 DIAGNOSIS — B9620 Unspecified Escherichia coli [E. coli] as the cause of diseases classified elsewhere: Secondary | ICD-10-CM | POA: Diagnosis present

## 2016-04-19 LAB — CBC WITH DIFFERENTIAL/PLATELET
BASOS PCT: 0 %
Basophils Absolute: 0 10*3/uL (ref 0.0–0.1)
EOS ABS: 0 10*3/uL (ref 0.0–0.7)
EOS PCT: 0 %
HCT: 29.3 % — ABNORMAL LOW (ref 39.0–52.0)
Hemoglobin: 9.3 g/dL — ABNORMAL LOW (ref 13.0–17.0)
Lymphocytes Relative: 5 %
Lymphs Abs: 0.5 10*3/uL — ABNORMAL LOW (ref 0.7–4.0)
MCH: 25.1 pg — ABNORMAL LOW (ref 26.0–34.0)
MCHC: 31.7 g/dL (ref 30.0–36.0)
MCV: 79.2 fL (ref 78.0–100.0)
MONO ABS: 0.3 10*3/uL (ref 0.1–1.0)
MONOS PCT: 3 %
Neutro Abs: 9.7 10*3/uL — ABNORMAL HIGH (ref 1.7–7.7)
Neutrophils Relative %: 92 %
Platelets: 386 10*3/uL (ref 150–400)
RBC: 3.7 MIL/uL — ABNORMAL LOW (ref 4.22–5.81)
RDW: 16.5 % — AB (ref 11.5–15.5)
WBC: 10.6 10*3/uL — ABNORMAL HIGH (ref 4.0–10.5)

## 2016-04-19 LAB — COMPREHENSIVE METABOLIC PANEL
ALBUMIN: 3.2 g/dL — AB (ref 3.5–5.0)
ALT: 70 U/L — ABNORMAL HIGH (ref 17–63)
ANION GAP: 10 (ref 5–15)
AST: 165 U/L — ABNORMAL HIGH (ref 15–41)
Alkaline Phosphatase: 168 U/L — ABNORMAL HIGH (ref 38–126)
BUN: 18 mg/dL (ref 6–20)
CO2: 22 mmol/L (ref 22–32)
Calcium: 8.4 mg/dL — ABNORMAL LOW (ref 8.9–10.3)
Chloride: 102 mmol/L (ref 101–111)
Creatinine, Ser: 1.1 mg/dL (ref 0.61–1.24)
GFR calc Af Amer: >60 mL/min (ref >60)
GFR calc non Af Amer: >60 mL/min (ref >60)
GLUCOSE: 170 mg/dL — AB (ref 65–99)
POTASSIUM: 4.2 mmol/L (ref 3.5–5.1)
SODIUM: 134 mmol/L — AB (ref 135–145)
TOTAL PROTEIN: 6.5 g/dL (ref 6.5–8.1)
Total Bilirubin: 2.5 mg/dL — ABNORMAL HIGH (ref 0.3–1.2)

## 2016-04-19 LAB — MAGNESIUM: MAGNESIUM: 2 mg/dL (ref 1.7–2.4)

## 2016-04-19 LAB — URINALYSIS, ROUTINE W REFLEX MICROSCOPIC
GLUCOSE, UA: NEGATIVE mg/dL
Hgb urine dipstick: NEGATIVE
Ketones, ur: NEGATIVE mg/dL
NITRITE: NEGATIVE
PH: 6 (ref 5.0–8.0)
PROTEIN: 30 mg/dL — AB
Specific Gravity, Urine: 1.02 (ref 1.005–1.030)

## 2016-04-19 LAB — I-STAT CHEM 8, ED
BUN: 23 mg/dL — AB (ref 6–20)
Calcium, Ion: 0.98 mmol/L — ABNORMAL LOW (ref 1.15–1.40)
Chloride: 100 mmol/L — ABNORMAL LOW (ref 101–111)
Creatinine, Ser: 1 mg/dL (ref 0.61–1.24)
Glucose, Bld: 166 mg/dL — ABNORMAL HIGH (ref 65–99)
HEMATOCRIT: 30 % — AB (ref 39.0–52.0)
HEMOGLOBIN: 10.2 g/dL — AB (ref 13.0–17.0)
Potassium: 4.2 mmol/L (ref 3.5–5.1)
SODIUM: 134 mmol/L — AB (ref 135–145)
TCO2: 22 mmol/L (ref 0–100)

## 2016-04-19 LAB — URINE MICROSCOPIC-ADD ON

## 2016-04-19 LAB — I-STAT CG4 LACTIC ACID, ED
LACTIC ACID, VENOUS: 1.57 mmol/L (ref 0.5–1.9)
Lactic Acid, Venous: 2.95 mmol/L (ref 0.5–1.9)

## 2016-04-19 LAB — I-STAT TROPONIN, ED: TROPONIN I, POC: 0 ng/mL (ref 0.00–0.08)

## 2016-04-19 LAB — GLUCOSE, CAPILLARY: GLUCOSE-CAPILLARY: 197 mg/dL — AB (ref 65–99)

## 2016-04-19 LAB — PHOSPHORUS: Phosphorus: 3.5 mg/dL (ref 2.5–4.6)

## 2016-04-19 MED ORDER — PANTOPRAZOLE SODIUM 40 MG PO TBEC
40.0000 mg | DELAYED_RELEASE_TABLET | Freq: Every day | ORAL | Status: DC
  Administered 2016-04-20 – 2016-04-24 (×5): 40 mg via ORAL
  Filled 2016-04-19 (×7): qty 1

## 2016-04-19 MED ORDER — SODIUM CHLORIDE 0.9 % IV BOLUS (SEPSIS)
1000.0000 mL | Freq: Once | INTRAVENOUS | Status: AC
  Administered 2016-04-19: 1000 mL via INTRAVENOUS

## 2016-04-19 MED ORDER — ONDANSETRON HCL 4 MG PO TABS
4.0000 mg | ORAL_TABLET | Freq: Four times a day (QID) | ORAL | Status: DC | PRN
  Filled 2016-04-19: qty 1

## 2016-04-19 MED ORDER — ACETAMINOPHEN 325 MG PO TABS
650.0000 mg | ORAL_TABLET | Freq: Four times a day (QID) | ORAL | Status: DC | PRN
  Administered 2016-04-19 – 2016-04-20 (×2): 650 mg via ORAL
  Filled 2016-04-19 (×2): qty 2

## 2016-04-19 MED ORDER — ISOSORBIDE MONONITRATE ER 30 MG PO TB24
15.0000 mg | ORAL_TABLET | Freq: Every morning | ORAL | Status: DC
  Administered 2016-04-20 – 2016-04-24 (×5): 15 mg via ORAL
  Filled 2016-04-19 (×5): qty 1

## 2016-04-19 MED ORDER — ASPIRIN EC 325 MG PO TBEC
325.0000 mg | DELAYED_RELEASE_TABLET | Freq: Two times a day (BID) | ORAL | Status: DC
  Administered 2016-04-19 – 2016-04-24 (×10): 325 mg via ORAL
  Filled 2016-04-19 (×10): qty 1

## 2016-04-19 MED ORDER — VANCOMYCIN HCL IN DEXTROSE 1-5 GM/200ML-% IV SOLN
1000.0000 mg | Freq: Once | INTRAVENOUS | Status: AC
  Administered 2016-04-19: 1000 mg via INTRAVENOUS
  Filled 2016-04-19: qty 200

## 2016-04-19 MED ORDER — ONDANSETRON HCL 4 MG/2ML IJ SOLN
4.0000 mg | Freq: Four times a day (QID) | INTRAMUSCULAR | Status: DC | PRN
  Administered 2016-04-20: 4 mg via INTRAVENOUS
  Filled 2016-04-19: qty 2

## 2016-04-19 MED ORDER — SODIUM CHLORIDE 0.9% FLUSH
3.0000 mL | Freq: Two times a day (BID) | INTRAVENOUS | Status: DC
  Administered 2016-04-19 – 2016-04-23 (×4): 3 mL via INTRAVENOUS

## 2016-04-19 MED ORDER — NITROGLYCERIN 0.4 MG SL SUBL: 0.4000 mg | SUBLINGUAL_TABLET | SUBLINGUAL | Status: DC | PRN

## 2016-04-19 MED ORDER — OXYCODONE HCL 5 MG PO TABS
5.0000 mg | ORAL_TABLET | ORAL | Status: DC | PRN
  Administered 2016-04-19: 10 mg via ORAL
  Administered 2016-04-20 – 2016-04-22 (×5): 5 mg via ORAL
  Filled 2016-04-19 (×4): qty 1
  Filled 2016-04-19: qty 2
  Filled 2016-04-19: qty 1

## 2016-04-19 MED ORDER — VANCOMYCIN HCL IN DEXTROSE 1-5 GM/200ML-% IV SOLN
1000.0000 mg | Freq: Two times a day (BID) | INTRAVENOUS | Status: DC
  Administered 2016-04-20: 1000 mg via INTRAVENOUS
  Filled 2016-04-19 (×2): qty 200

## 2016-04-19 MED ORDER — FAMOTIDINE IN NACL 20-0.9 MG/50ML-% IV SOLN
20.0000 mg | Freq: Once | INTRAVENOUS | Status: AC
  Administered 2016-04-19: 20 mg via INTRAVENOUS
  Filled 2016-04-19: qty 50

## 2016-04-19 MED ORDER — SODIUM CHLORIDE 0.9 % IV SOLN
INTRAVENOUS | Status: DC
  Administered 2016-04-19 – 2016-04-21 (×2): via INTRAVENOUS

## 2016-04-19 MED ORDER — SODIUM CHLORIDE 0.9 % IV SOLN: INTRAVENOUS | Status: DC

## 2016-04-19 MED ORDER — KETOROLAC TROMETHAMINE 15 MG/ML IJ SOLN: 15.0000 mg | Freq: Once | INTRAMUSCULAR | Status: DC

## 2016-04-19 MED ORDER — ONDANSETRON HCL 4 MG/2ML IJ SOLN
INTRAMUSCULAR | Status: AC
  Filled 2016-04-19: qty 2

## 2016-04-19 MED ORDER — KETOROLAC TROMETHAMINE 30 MG/ML IJ SOLN
30.0000 mg | Freq: Once | INTRAMUSCULAR | Status: AC
  Administered 2016-04-19: 30 mg via INTRAVENOUS
  Filled 2016-04-19: qty 1

## 2016-04-19 MED ORDER — SODIUM CHLORIDE 0.9 % IV SOLN
500.0000 mg | Freq: Once | INTRAVENOUS | Status: AC
  Administered 2016-04-19: 500 mg via INTRAVENOUS
  Filled 2016-04-19: qty 500

## 2016-04-19 MED ORDER — LISINOPRIL 10 MG PO TABS
10.0000 mg | ORAL_TABLET | Freq: Every day | ORAL | Status: DC
  Administered 2016-04-20 – 2016-04-24 (×5): 10 mg via ORAL
  Filled 2016-04-19 (×5): qty 1

## 2016-04-19 MED ORDER — PIPERACILLIN-TAZOBACTAM 3.375 G IVPB
3.3750 g | Freq: Three times a day (TID) | INTRAVENOUS | Status: DC
  Administered 2016-04-20 – 2016-04-22 (×7): 3.375 g via INTRAVENOUS
  Filled 2016-04-19 (×9): qty 50

## 2016-04-19 MED ORDER — CLOPIDOGREL BISULFATE 75 MG PO TABS
75.0000 mg | ORAL_TABLET | Freq: Every day | ORAL | Status: DC
  Administered 2016-04-20 – 2016-04-24 (×5): 75 mg via ORAL
  Filled 2016-04-19 (×5): qty 1

## 2016-04-19 MED ORDER — ACETAMINOPHEN 650 MG RE SUPP: 650.0000 mg | Freq: Four times a day (QID) | RECTAL | Status: DC | PRN

## 2016-04-19 MED ORDER — METHOCARBAMOL 500 MG PO TABS: 500.0000 mg | ORAL_TABLET | Freq: Four times a day (QID) | ORAL | Status: DC | PRN

## 2016-04-19 MED ORDER — METOPROLOL TARTRATE 25 MG PO TABS
37.5000 mg | ORAL_TABLET | Freq: Two times a day (BID) | ORAL | Status: DC
  Administered 2016-04-19 – 2016-04-24 (×10): 37.5 mg via ORAL
  Filled 2016-04-19 (×10): qty 1

## 2016-04-19 MED ORDER — PIPERACILLIN-TAZOBACTAM 3.375 G IVPB 30 MIN
3.3750 g | Freq: Once | INTRAVENOUS | Status: AC
  Administered 2016-04-19: 3.375 g via INTRAVENOUS
  Filled 2016-04-19: qty 50

## 2016-04-19 MED ORDER — ENOXAPARIN SODIUM 40 MG/0.4ML ~~LOC~~ SOLN
40.0000 mg | SUBCUTANEOUS | Status: DC
  Administered 2016-04-20: 40 mg via SUBCUTANEOUS
  Filled 2016-04-19: qty 0.4

## 2016-04-19 NOTE — H&P (Signed)
History and Physical    [name] [id number] DOB: Nov 25, 1934 DOA: 04/19/2016  PCP: Eliezer Lofts, MD   Patient coming from: Home.  Chief Complaint: Chest Pain.  HPI: [name] is a 81 y.o. male with medical history significant of CAD, hyperlipidemia, hypertension, pre-diabetes, DJD, GERD, hiatal hernia who was brought to the ER via EMS due to chest pain.  Per patient, he had been doing well since he was discharged earlier this month following his left TKA. He will call this morning do not feel any symptoms. He had soup around noon time and states that around 1500 he felt indigestion and abdominal discomfort. He took antiacids and then some Rolaids without any significant relief. He states that the pain got a lot worse and radiates to his chest. By that time, his wife had already arrived home and called 911. When EMS arrived his systolic blood pressure was 89 mmHg and following a normal saline bolus and his blood pressure improved to 100/50 mmHg. They also gave the patient 324 mg of aspirin and the patient reported that his pain was resolved when being seen by the tree etch nurse. At that moment, the triage nursing staff reports the patient was having basal rigors and a temperature 99.75F. His temperature was rechecked and noticed them to be 103.42F.  ED Course: The patient received supplemental oxygen,  IVF boluses, IV vancomycin and Zosyn visibly improving. Workup showed WBC of 10.4, hemoglobin of 9.6 g/dL, glucose of 170 mg/dL, lactic acid of 2.95. Urinalysis and chest radiograph were nondiagnostic  Review of Systems: As per HPI otherwise 10 point review of systems negative.    Past Medical History:  Diagnosis Date  . Coronary atherosclerosis of unspecified type of vessel, native or graft    s/p stent mid LAD 2005  . Depression   . DJD (degenerative joint disease)   . GERD (gastroesophageal reflux disease)   . History of hiatal hernia   . HTN (hypertension)   . Hyperlipidemia    . Pre-diabetes     Past Surgical History:  Procedure Laterality Date  . BRAIN SURGERY    . CARDIAC CATHETERIZATION    . CORONARY ARTERY BYPASS GRAFT N/A 11/03/2014   Procedure: CORONARY ARTERY BYPASS GRAFTING (CABG), ON PUMP, TIMES FOUR, USING LEFT INTERNAL MAMMARY, RIGHT GREATER SAPHENOUS VEIN HARVESTED ENDOSCOPICALLY;  Surgeon: Ivin Poot, MD;  Location: [place];  Service: Open Heart Surgery;  Laterality: N/A;  LIMA-LAD; SVG-DIAG; SVG-OM; SVG-RCA  . CRANIOTOMY N/A 01/18/2014   Procedure: CRANIOTOMY HEMATOMA EVACUATION SUBDURAL;  Surgeon: Hosie Spangle, MD;  Location: [place];  Service: Neurosurgery;  Laterality: N/A;  . kidney stone removal    . KNEE SURGERY    . LEFT HEART CATHETERIZATION WITH CORONARY ANGIOGRAM N/A 07/27/2011   Procedure: LEFT HEART CATHETERIZATION WITH CORONARY ANGIOGRAM;  Surgeon: Burnell Blanks, MD;  Location: [facility] CATH LAB;  Service: Cardiovascular;  Laterality: N/A;  . LEFT HEART CATHETERIZATION WITH CORONARY ANGIOGRAM N/A 10/31/2014   Procedure: LEFT HEART CATHETERIZATION WITH CORONARY ANGIOGRAM;  Surgeon: Sherren Mocha, MD;  Location: [facility] CATH LAB;  Service: Cardiovascular;  Laterality: N/A;  . PTCA     Hx of it.   Marland Kitchen SIGMOIDOSCOPY    . TEE WITHOUT CARDIOVERSION N/A 11/03/2014   Procedure: TRANSESOPHAGEAL ECHOCARDIOGRAM (TEE);  Surgeon: Ivin Poot, MD;  Location: [place];  Service: Open Heart Surgery;  Laterality: N/A;  . TOTAL KNEE ARTHROPLASTY  02/2011   Right knee  . TOTAL KNEE ARTHROPLASTY Left 04/11/2016  Procedure: LEFT TOTAL KNEE ARTHROPLASTY;  Surgeon: Vickey Huger, MD;  Location: [place];  Service: Orthopedics;  Laterality: Left;     reports that he has quit smoking. He has quit using smokeless tobacco. He reports that he does not drink alcohol or use drugs.  Allergies  Allergen Reactions  . Protamine Rash    Hypotension, Rash, unstable vitals.    . Statins Other (See Comments)    Caused soreness (Lipitor, Zocor)  . Rosuvastatin Calcium  Other (See Comments)    Caused soreness (Lipitor, Zocor)  . Imodium [Loperamide] Itching and Rash    Family History  Problem Relation Age of Onset  . Heart disease Father   . Heart attack Father   . Heart failure Mother   . COPD Mother   . Heart disease Sister   . Heart attack Sister   . Heart attack Brother     Prior to Admission medications   Medication Sig Start Date End Date Taking? Authorizing Provider  acetaminophen (TYLENOL) 500 MG tablet Take 500-1,000 mg by mouth 2 (two) times daily as needed (for pain).    Yes Historical Provider, MD  aspirin EC 325 MG EC tablet Take 1 tablet (325 mg total) by mouth 2 (two) times daily. 04/12/16  Yes Donia Ast, PA  clopidogrel (PLAVIX) 75 MG tablet TAKE ONE TABLET BY MOUTH ONCE DAILY Patient taking differently: Take 75 mg by mouth in the morning 01/11/16  Yes Burnell Blanks, MD  isosorbide mononitrate (IMDUR) 30 MG 24 hr tablet Take 1 tablet (30 mg total) by mouth daily. Patient taking differently: Take 15 mg by mouth every morning.  10/12/15  Yes Almyra Deforest, PA  lisinopril (PRINIVIL,ZESTRIL) 10 MG tablet Take 1 tablet (10 mg total) by mouth daily. 04/08/16  Yes Burnell Blanks, MD  methocarbamol (ROBAXIN) 500 MG tablet Take 1-2 tablets (500-1,000 mg total) by mouth every 6 (six) hours as needed for muscle spasms. Patient taking differently: Take 500 mg by mouth every 6 (six) hours as needed for muscle spasms.  04/12/16  Yes Donia Ast, PA  metoprolol tartrate (LOPRESSOR) 25 MG tablet TAKE ONE AND ONE-HALF TABLETS BY MOUTH TWICE A DAY Patient taking differently: Take 37.5 mg by mouth two times a day 07/14/15  Yes Burnell Blanks, MD  nitroGLYCERIN (NITROSTAT) 0.4 MG SL tablet Place 0.4 mg under the tongue every 5 (five) minutes as needed for chest pain.   Yes Historical Provider, MD  oxyCODONE (OXY IR/ROXICODONE) 5 MG immediate release tablet Take 1-2 tablets (5-10 mg total) by mouth every 3 (three) hours as needed  for breakthrough pain. 04/12/16  Yes Donia Ast, PA  pantoprazole (PROTONIX) 40 MG tablet Take 1 tablet (40 mg total) by mouth daily. 10/12/15  Yes Almyra Deforest, PA  rosuvastatin (CRESTOR) 5 MG tablet Take 1 tablet (5 mg total) by mouth daily. 04/08/16 07/07/16 Yes Burnell Blanks, MD  ondansetron (ZOFRAN) 4 MG tablet Take 1 tablet (4 mg total) by mouth every 6 (six) hours as needed for nausea. 04/12/16   Donia Ast, PA    Physical Exam:  Constitutional: Mildly febrile, NAD otherwise. Vitals:   04/19/16 2215 04/19/16 2230 04/19/16 2245 04/19/16 2300  BP: 147/75 136/72 130/63 129/60  Pulse: 110 107 106 111  Resp: (!) 31 16 19 16   Temp:      TempSrc:      SpO2: 97% 96% 97% 97%  Weight:      Height:  Eyes: PERRL, lids and conjunctivae normal ENMT: Mucous membranes are moist. Posterior pharynx clear of any exudate or lesions.  Neck: normal, supple, no masses, no thyromegaly Respiratory: clear to auscultation bilaterally, no wheezing, no crackles. Normal respiratory effort. No accessory muscle use.  Cardiovascular: Tachycardic with a regular rhythm at 108 BPM, no murmurs / rubs / gallops. No extremity edema. 2+ pedal pulses. No carotid bruits.  Abdomen: Soft, mild epigastric tenderness, no masses palpated. No hepatosplenomegaly. Bowel sounds positive.  Musculoskeletal: no clubbing / cyanosis. No joint deformity upper and lower extremities. Good ROM on left knee, no contractures. Normal muscle tone.  Skin: Left knee surgical wound appears to be healing uneventfully.  Neurologic: CN 2-12 grossly intact. Sensation intact, DTR normal. Strength 5/5 in all 4.  Psychiatric: Normal judgment and insight. Alert and oriented x 4. Normal mood.    Labs on Admission: I have personally reviewed following labs and imaging studies  CBC:  Recent Labs Lab 04/19/16 1850 04/19/16 1856  WBC 10.6*  --   NEUTROABS 9.7*  --   HGB 9.3* 10.2*  HCT 29.3* 30.0*  MCV 79.2  --   PLT 386   --    Basic Metabolic Panel:  Recent Labs Lab 04/19/16 1850 04/19/16 1856  NA 134* 134*  K 4.2 4.2  CL 102 100*  CO2 22  --   GLUCOSE 170* 166*  BUN 18 23*  CREATININE 1.10 1.00  CALCIUM 8.4*  --   MG 2.0  --   PHOS 3.5  --    GFR: Estimated Creatinine Clearance: 62.8 mL/min (by C-G formula based on SCr of 1 mg/dL). Liver Function Tests:  Recent Labs Lab 04/19/16 1850  AST 165*  ALT 70*  ALKPHOS 168*  BILITOT 2.5*  PROT 6.5  ALBUMIN 3.2*   No results for input(s): LIPASE, AMYLASE in the last 168 hours. No results for input(s): AMMONIA in the last 168 hours. Coagulation Profile: No results for input(s): INR, PROTIME in the last 168 hours. Cardiac Enzymes: No results for input(s): CKTOTAL, CKMB, CKMBINDEX, TROPONINI in the last 168 hours. BNP (last 3 results) No results for input(s): PROBNP in the last 8760 hours. HbA1C: No results for input(s): HGBA1C in the last 72 hours. CBG: No results for input(s): GLUCAP in the last 168 hours. Lipid Profile: No results for input(s): CHOL, HDL, LDLCALC, TRIG, CHOLHDL, LDLDIRECT in the last 72 hours. Thyroid Function Tests: No results for input(s): TSH, T4TOTAL, FREET4, T3FREE, THYROIDAB in the last 72 hours. Anemia Panel: No results for input(s): VITAMINB12, FOLATE, FERRITIN, TIBC, IRON, RETICCTPCT in the last 72 hours. Urine analysis:    Component Value Date/Time   COLORURINE ORANGE (A) 04/19/2016 1935   APPEARANCEUR CLEAR 04/19/2016 1935   LABSPEC 1.020 04/19/2016 1935   PHURINE 6.0 04/19/2016 1935   GLUCOSEU NEGATIVE 04/19/2016 [place] NEGATIVE 04/19/2016 1935   BILIRUBINUR SMALL (A) 04/19/2016 [place] NEGATIVE 04/19/2016 1935   PROTEINUR 30 (A) 04/19/2016 1935   UROBILINOGEN 0.2 10/31/2014 2254   NITRITE NEGATIVE 04/19/2016 1935   LEUKOCYTESUR SMALL (A) 04/19/2016 1935   Radiological Exams on Admission: Dg Chest Port 1 View  Result Date: 04/19/2016 CLINICAL DATA:  Chest pain central in  nature, nonradiating EXAM: PORTABLE CHEST 1 VIEW COMPARISON:  03/31/2016 FINDINGS: AP portable view chest. Median sternotomy wires again evident. Low lung volumes. Mild cardiomegaly. Mild central vascular congestion and suggestion of mild perihilar edema. Atherosclerosis of the aortic arch. No pneumothorax. Old left clavicular fracture. Hiatal hernia  is less apparent. IMPRESSION: 1. Low lung volumes. 2. Mild cardiomegaly with central vascular congestion and findings suggestive of mild perihilar edema. 3. No acute consolidation 4. Atherosclerotic vascular calcification of the aorta Electronically Signed   By: Donavan Foil M.D.   On: 04/19/2016 20:03   Echocardiogram: 10/15/2015 ------------------------------------------------------------------- LV EF: 60% -   65%  ------------------------------------------------------------------- Indications:      Dyspnea 786.09.  ------------------------------------------------------------------- History:   PMH:  Chronic DOE. CAD S/P CABG. Hypertension. Hyperlipidemia. H/O traumatic subdural hematoma. GERD.  ------------------------------------------------------------------- Study Conclusions  - Left ventricle: The cavity size was normal. There was moderate   concentric hypertrophy. Systolic function was normal. The   estimated ejection fraction was in the range of 60% to 65%. Wall   motion was normal; there were no regional wall motion   abnormalities. Features are consistent with a pseudonormal left   ventricular filling pattern, with concomitant abnormal relaxation   and increased filling pressure (grade 2 diastolic dysfunction). - Left atrium: The atrium was severely dilated. - Right ventricle: The cavity size was mildly dilated. Wall   thickness was normal. - Right atrium: The atrium was mildly dilated  EKG: Independently reviewed. Vent. rate 109 BPM PR interval * ms QRS duration 131 ms QT/QTc 359/484 ms P-R-T axes -4 -55 28 Sinus  tachycardia Atrial premature complexes RBBB and LAFB Baseline wander in lead(s) V3  Assessment/Plan Principal Problem:   Sepsis due to undetermined organism ([place]) Admit to stepdown/Inpatient. Continue supplemental oxygen. Continue IV fluids. Continue antipyretics for fever. Continue vancomycin and Zosyn. Follow-up blood cultures and sensitivity. Will check CT chest to rule out PE/Pneumonia.  Active Problems:   Diabetes with neurologic complications (HCC) Carbohydrate modified diet. CBG monitoring with regular insulin sliding scale while in the hospital.    Hyperlipidemia Continue heart healthy diet. Hold Crestor due to abnormal LFTs.    HTN (hypertension) Continue metoprolol 37.5 mg po BID. Continue lisinopril 10 mg po daily. Monitor BP, renal function and electrolytes periodically.    Coronary atherosclerosis Continue aspirin, beta blocker,clopidogrel and isosorbide. Holding statin due to abnormal LFT's    GERD Continue Pantoprazole 40 mg po daily.    S/P total knee replacement Continue Oxycodone for pain management. Monitor surgical wound for any signs of infection.   DVT prophylaxis: Lovenox SQ. Code Status: Full code.  Family Communication: His wife was present in the room. Disposition Plan: Admit to stepdown for Iv antibiotic therapy and further evaluation. Consults called:  Admission status: inpatient/Stepdown.    Reubin Milan MD [facility] Pager [phone].  If 7PM-7AM, please contact night-coverage [email or web address] Password [email or web address]  04/19/2016, 11:14 PM

## 2016-04-19 NOTE — ED Provider Notes (Addendum)
[facility] DEPT Provider Note   CSN: [id number] Arrival date & time: 04/19/16  1751     History   Chief Complaint Chief Complaint  Patient presents with  . Chest Pain    HPI [name] is a 81 y.o. male.  Patient brought in by EMS with the complaint of chest pain that was substernal central in nature nonradiating but shortly after arrival patient was noted to have significant chills. EMS reported slight hypotension at home with his systolic blood pressure of 89. But upon arrival here blood pressures were all above 90. He was tachycardic and his respiratory rate was up. Patient was given 324 mg of aspirin. Chest pain resolved. Patient on October 2 status post left knee arthroplasty by Dr. Lorre Nick. Patient denies any increased pain with knee. Wound and been observed by nurses home and was deemed to be healing properly. Patient felt fine yesterday. All the symptoms came on suddenly today.      Past Medical History:  Diagnosis Date  . Coronary atherosclerosis of unspecified type of vessel, native or graft    s/p stent mid LAD 2005  . Depression   . DJD (degenerative joint disease)   . GERD (gastroesophageal reflux disease)   . History of hiatal hernia   . HTN (hypertension)   . Hyperlipidemia   . Pre-diabetes     Patient Active Problem List   Diagnosis Date Noted  . S/P total knee replacement 04/11/2016  . Bilateral lower abdominal pain 02/05/2016  . Coronary artery disease involving native coronary artery of native heart without angina pectoris   . S/P CABG x 4 11/03/2014  . Exertional angina (HCC) 10/31/2014  . Neuropathy in diabetes ([place]) 03/07/2014  . Low back pain 03/07/2014  . Subdural hematoma ([place]) 01/18/2014  . Diabetes with neurologic complications ([place]) [id number]  . Hyperlipidemia 11/26/2007  . HTN (hypertension) 11/26/2007  . CORONARY ARTERY DISEASE 11/26/2007  . GERD 11/26/2007  . DEGENERATIVE JOINT DISEASE 11/26/2007  . PERCUTANEOUS TRANSLUMINAL  CORONARY ANGIOPLASTY, HX OF 11/26/2007    Past Surgical History:  Procedure Laterality Date  . BRAIN SURGERY    . CARDIAC CATHETERIZATION    . CORONARY ARTERY BYPASS GRAFT N/A 11/03/2014   Procedure: CORONARY ARTERY BYPASS GRAFTING (CABG), ON PUMP, TIMES FOUR, USING LEFT INTERNAL MAMMARY, RIGHT GREATER SAPHENOUS VEIN HARVESTED ENDOSCOPICALLY;  Surgeon: Ivin Poot, MD;  Location: [facility];  Service: Open Heart Surgery;  Laterality: N/A;  LIMA-LAD; SVG-DIAG; SVG-OM; SVG-RCA  . CRANIOTOMY N/A 01/18/2014   Procedure: CRANIOTOMY HEMATOMA EVACUATION SUBDURAL;  Surgeon: Hosie Spangle, MD;  Location: [facility];  Service: Neurosurgery;  Laterality: N/A;  . kidney stone removal    . KNEE SURGERY    . LEFT HEART CATHETERIZATION WITH CORONARY ANGIOGRAM N/A 07/27/2011   Procedure: LEFT HEART CATHETERIZATION WITH CORONARY ANGIOGRAM;  Surgeon: Burnell Blanks, MD;  Location[facility] C[facility] LAB;  Service: Cardiovascular;  Laterality: N/A;  . LEFT HEART CATHETERIZATION WITH CORONARY ANGIOGRAM N/A 10/31/2014   Procedure: LEFT HEART CATHETERIZATION WITH CORONARY ANGIOGRAM;  Surgeon: Sherren Mocha, MD;  Location: [facility] CATH LAB;  Service: Cardiovascular;  Laterality: N/A;  . PTCA     Hx of it.   Marland Kitchen SIGMOIDOSCOPY    . TEE WITHOUT CARDIOVERSION N/A 11/03/2014   Procedure: TRANSESOPHAGEAL ECHOCARDIOGRAM (TEE);  Surgeon: Ivin Poot, MD;  Location: [facility];  Service: Open Heart Surgery;  Laterality: N/A;  . TOTAL KNEE ARTHROPLASTY  02/2011   Right knee  . TOTAL KNEE ARTHROPLASTY Left 04/11/2016  Procedure: LEFT TOTAL KNEE ARTHROPLASTY;  Surgeon: Vickey Huger, MD;  Location: [place];  Service: Orthopedics;  Laterality: Left;       Home Medications    Prior to Admission medications   Medication Sig Start Date End Date Taking? Authorizing Provider  acetaminophen (TYLENOL) 500 MG tablet Take 1,000 mg by mouth 2 (two) times daily.     Historical Provider, MD  aspirin EC 325 MG EC tablet Take 1 tablet (325 mg total)  by mouth 2 (two) times daily. 04/12/16   Donia Ast, PA  clopidogrel (PLAVIX) 75 MG tablet TAKE ONE TABLET BY MOUTH ONCE DAILY 01/11/16   Burnell Blanks, MD  isosorbide mononitrate (IMDUR) 30 MG 24 hr tablet Take 1 tablet (30 mg total) by mouth daily. Patient not taking: Reported on 03/30/2016 10/12/15   Almyra Deforest, PA  lisinopril (PRINIVIL,ZESTRIL) 10 MG tablet Take 1 tablet (10 mg total) by mouth daily. 04/08/16   Burnell Blanks, MD  methocarbamol (ROBAXIN) 500 MG tablet Take 1-2 tablets (500-1,000 mg total) by mouth every 6 (six) hours as needed for muscle spasms. 04/12/16   Donia Ast, PA  metoprolol tartrate (LOPRESSOR) 25 MG tablet TAKE ONE AND ONE-HALF TABLETS BY MOUTH TWICE A DAY Patient taking differently: TAKE 1.5 TABLETS (37.5MG ) BY MOUTH TWICE DAILY 07/14/15   Burnell Blanks, MD  nitroGLYCERIN (NITROSTAT) 0.4 MG SL tablet Place 0.4 mg under the tongue every 5 (five) minutes as needed for chest pain.    Historical Provider, MD  ondansetron (ZOFRAN) 4 MG tablet Take 1 tablet (4 mg total) by mouth every 6 (six) hours as needed for nausea. 04/12/16   Donia Ast, PA  oxyCODONE (OXY IR/ROXICODONE) 5 MG immediate release tablet Take 1-2 tablets (5-10 mg total) by mouth every 3 (three) hours as needed for breakthrough pain. 04/12/16   Donia Ast, PA  pantoprazole (PROTONIX) 40 MG tablet Take 1 tablet (40 mg total) by mouth daily. 10/12/15   Almyra Deforest, PA  rosuvastatin (CRESTOR) 5 MG tablet Take 1 tablet (5 mg total) by mouth daily. 04/08/16 07/07/16  Burnell Blanks, MD    Family History Family History  Problem Relation Age of Onset  . Heart disease Father   . Heart attack Father   . Heart failure Mother   . COPD Mother   . Heart disease Sister   . Heart attack Sister   . Heart attack Brother     Social History Social History  Substance Use Topics  . Smoking status: Former Research scientist (life sciences)  . Smokeless tobacco: Former Systems developer     Comment: quit in  1985. smoked 1ppd for 35 years   . Alcohol use No     Allergies   Protamine; Statins; Rosuvastatin calcium; and Imodium [loperamide]   Review of Systems Review of Systems  Constitutional: Positive for chills.  HENT: Negative for congestion.   Eyes: Negative for visual disturbance.  Respiratory: Positive for shortness of breath. Negative for cough.   Cardiovascular: Positive for chest pain.  Gastrointestinal: Positive for nausea. Negative for abdominal pain and diarrhea.  Genitourinary: Negative for dysuria.  Musculoskeletal: Negative for back pain.  Skin: Negative for rash.  Neurological: Negative for headaches.  Hematological: Does not bruise/bleed easily.  Psychiatric/Behavioral: Negative for confusion.     Physical Exam Updated Vital Signs BP 138/73   Pulse 101   Temp (S) (!) 103.4 F (39.7 C) (Rectal) Comment: Dr. Rogene Houston informed.  Resp 14   Ht 5\' 7"  (1.702 m)  Wt 86.2 kg   SpO2 99%   BMI 29.76 kg/m   Physical Exam  Constitutional: He is oriented to person, place, and time. He appears well-developed and well-nourished. No distress.  HENT:  Head: Normocephalic and atraumatic.  Mouth/Throat: Oropharynx is clear and moist.  Eyes: EOM are normal. Pupils are equal, round, and reactive to light.  Neck: Normal range of motion.  Cardiovascular: Regular rhythm.   Tachycardic  Pulmonary/Chest: Breath sounds normal.  Increased respiratory rate  Abdominal: Soft. Bowel sounds are normal. There is no tenderness.  Musculoskeletal: He exhibits edema. He exhibits no tenderness.  Normal except for left knee surgical wound appears normal. Under dressing. No significant tenderness no significant erythema.  Neurological: He is alert and oriented to person, place, and time. No cranial nerve deficit. He exhibits normal muscle tone. Coordination normal.  Skin: Skin is warm. No rash noted.  Nursing note and vitals reviewed.    ED Treatments / Results  Labs (all labs  ordered are listed, but only abnormal results are displayed) Labs Reviewed  CBC WITH DIFFERENTIAL/PLATELET - Abnormal; Notable for the following:       Result Value   WBC 10.6 (*)    RBC 3.70 (*)    Hemoglobin 9.3 (*)    HCT 29.3 (*)    MCH 25.1 (*)    RDW 16.5 (*)    Neutro Abs 9.7 (*)    Lymphs Abs 0.5 (*)    All other components within normal limits  COMPREHENSIVE METABOLIC PANEL - Abnormal; Notable for the following:    Sodium 134 (*)    Glucose, Bld 170 (*)    Calcium 8.4 (*)    Albumin 3.2 (*)    AST 165 (*)    ALT 70 (*)    Alkaline Phosphatase 168 (*)    Total Bilirubin 2.5 (*)    All other components within normal limits  I-STAT CHEM 8, ED - Abnormal; Notable for the following:    Sodium 134 (*)    Chloride 100 (*)    BUN 23 (*)    Glucose, Bld 166 (*)    Calcium, Ion 0.98 (*)    Hemoglobin 10.2 (*)    HCT 30.0 (*)    All other components within normal limits  I-STAT CG4 LACTIC ACID, ED - Abnormal; Notable for the following:    Lactic Acid, Venous 2.95 (*)    All other components within normal limits  CULTURE, BLOOD (ROUTINE X 2)  CULTURE, BLOOD (ROUTINE X 2)  URINE CULTURE  URINALYSIS, ROUTINE W REFLEX MICROSCOPIC (NOT AT [facility])  CBG MONITORING, ED  I-STAT TROPOININ, ED  I-STAT CG4 LACTIC ACID, ED    EKG  EKG Interpretation  Date/Time:  Tuesday April 19 2016 18:47:00 EDT Ventricular Rate:  109 PR Interval:    QRS Duration: 131 QT Interval:  359 QTC Calculation: 484 R Axis:   -55 Text Interpretation:  Sinus tachycardia Atrial premature complexes RBBB and LAFB Baseline wander in lead(s) V3 Confirmed by Larone Kliethermes  MD, [place] [id number]) on 04/19/2016 6:52:28 PM       Radiology Dg Chest Port 1 View  Result Date: 04/19/2016 CLINICAL DATA:  Chest pain central in nature, nonradiating EXAM: PORTABLE CHEST 1 VIEW COMPARISON:  03/31/2016 FINDINGS: AP portable view chest. Median sternotomy wires again evident. Low lung volumes. Mild cardiomegaly. Mild central  vascular congestion and suggestion of mild perihilar edema. Atherosclerosis of the aortic arch. No pneumothorax. Old left clavicular fracture. Hiatal hernia is less apparent. IMPRESSION:  1. Low lung volumes. 2. Mild cardiomegaly with central vascular congestion and findings suggestive of mild perihilar edema. 3. No acute consolidation 4. Atherosclerotic vascular calcification of the aorta Electronically Signed   By: Donavan Foil M.D.   On: 04/19/2016 20:03    Procedures Procedures (including critical care time)  CRITICAL CARE Performed by: Fredia Sorrow Total critical care time: 30 minutes Critical care time was exclusive of separately billable procedures and treating other patients. Critical care was necessary to treat or prevent imminent or life-threatening deterioration. Critical care was time spent personally by me on the following activities: development of treatment plan with patient and/or surrogate as well as nursing, discussions with consultants, evaluation of patient's response to treatment, examination of patient, obtaining history from patient or surrogate, ordering and performing treatments and interventions, ordering and review of laboratory studies, ordering and review of radiographic studies, pulse oximetry and re-evaluation of patient's condition.   Medications Ordered in ED Medications  sodium chloride 0.9 % bolus 1,000 mL (0 mLs Intravenous Stopped 04/19/16 2044)    And  sodium chloride 0.9 % bolus 1,000 mL (1,000 mLs Intravenous New Bag/Given 04/19/16 2045)    And  sodium chloride 0.9 % bolus 1,000 mL (not administered)  vancomycin (VANCOCIN) 500 mg in sodium chloride 0.9 % 100 mL IVPB (500 mg Intravenous New Bag/Given 04/19/16 2030)  piperacillin-tazobactam (ZOSYN) IVPB 3.375 g (not administered)  vancomycin (VANCOCIN) IVPB 1000 mg/200 mL premix (not administered)  piperacillin-tazobactam (ZOSYN) IVPB 3.375 g (0 g Intravenous Stopped 04/19/16 2045)  vancomycin  (VANCOCIN) IVPB 1000 mg/200 mL premix (0 mg Intravenous Stopped 04/19/16 2045)     Initial Impression / Assessment and Plan / ED Course  I have reviewed the triage vital signs and the nursing notes.  Pertinent labs & imaging results that were available during my care of the patient were reviewed by me and considered in my medical decision making (see chart for details).  Clinical Course   Patient brought in by EMS. Initial complaint was chest pain but patient arrives with chills and shakes and felt very warm. Chest pain was substernal little bit to the left side. Patient's troponin negative however vital signs showed tachycardia respiratory rate was up and eventually a documented fever to 103. Patient started on septic protocol medications. Blood pressure technically never got below 90. However patient was given 3 L of fluid which brought his heart rate down to the low 100s and patient feels much better. Chest x-ray without acute finding of pneumonia but some suspicious changes could be early pneumonia. Patient the urinalysis is still pending. Patient with recent left arthroplasty of the knee. The wound appears normal this was done by Dr. Lorre Nick. No increase pain to that area. Bandage was recently evaluated by home nurse and was felt to be fine.   Final Clinical Impressions(s) / ED Diagnoses   Final diagnoses:  Sepsis, due to unspecified organism [facility])    New Prescriptions New Prescriptions   No medications on file     Fredia Sorrow, MD 04/19/16 2155    Fredia Sorrow, MD 04/19/16 2156

## 2016-04-19 NOTE — ED Triage Notes (Signed)
Patient comes by EMS for chest pain that was central in nature and non radiating.  Patient was slightly hypotensive for EMS with a BP systolic of 89.  Gave him the start of a bolus and pressures got back to 100/50.  Gave 324 ASA and pain was gone.  Patient has a temp of 99.9 and is visibly shaking in bed.  A&Ox4

## 2016-04-19 NOTE — Progress Notes (Signed)
Pharmacy Antibiotic Note  [name] is a 81 y.o. male admitted on 04/19/2016 with sepsis.  Pharmacy has been consulted for vancomycin and zosyn dosing. Pt CC of CP, also hypotensive and febrile, lactic 2.95. CP resolved with ASA 324 mg. Had knee replacement last week.  Plan: Vancomycin 1000 mg IV x1 plus 500 mg IV x1 Vancomycin 1000 mg IV q12h Zosyn 3.375 g IV 30 minute infusion Zosyn 3.375 g IV q8h extended infusion  Monitor clinical course and renal function  Height: 5\' 7"  (170.2 cm) Weight: 190 lb (86.2 kg) IBW/kg (Calculated) : 66.1  Temp (24hrs), Avg:103.4 F (39.7 C), Min:103.4 F (39.7 C), Max:103.4 F (39.7 C)   Recent Labs Lab 04/19/16 1850 04/19/16 1856 04/19/16 1857  WBC 10.6*  --   --   CREATININE 1.10 1.00  --   LATICACIDVEN  --   --  2.95*    Estimated Creatinine Clearance: 62.8 mL/min (by C-G formula based on SCr of 1 mg/dL).    Allergies  Allergen Reactions  . Protamine Rash    Hypotension, Rash, unstable vitals.  Hypotension, Rash, unstable vitals.   . Statins Other (See Comments)    Caused soreness (Lipitor, Zocor)  . Rosuvastatin Calcium Other (See Comments)    Caused soreness (Lipitor, Zocor)  . Imodium [Loperamide] Itching and Rash    Antimicrobials this admission:  Vanc 10/10 >> Zosyn 10/10 >>  Dose adjustments this admission:  N/A  Microbiology results:  10/10 BCx: Sent 10/10 UCx: Sent  Thank you for allowing pharmacy to be a part of this patient's care.  Cheral Almas, PharmD Candidate 04/19/2016 8:22 PM

## 2016-04-19 NOTE — ED Triage Notes (Deleted)
Patient Alert and oriented X4. Stable and ambulatory. Patient verbalized understanding of the discharge instructions.  Patient belongings were taken by the patient.  

## 2016-04-20 ENCOUNTER — Inpatient Hospital Stay (HOSPITAL_COMMUNITY): Payer: Medicare HMO

## 2016-04-20 ENCOUNTER — Encounter (HOSPITAL_COMMUNITY): Payer: Self-pay | Admitting: *Deleted

## 2016-04-20 DIAGNOSIS — E1159 Type 2 diabetes mellitus with other circulatory complications: Secondary | ICD-10-CM | POA: Diagnosis present

## 2016-04-20 DIAGNOSIS — D649 Anemia, unspecified: Secondary | ICD-10-CM | POA: Diagnosis present

## 2016-04-20 DIAGNOSIS — A4151 Sepsis due to Escherichia coli [E. coli]: Secondary | ICD-10-CM

## 2016-04-20 DIAGNOSIS — B9620 Unspecified Escherichia coli [E. coli] as the cause of diseases classified elsewhere: Secondary | ICD-10-CM

## 2016-04-20 DIAGNOSIS — G9340 Encephalopathy, unspecified: Secondary | ICD-10-CM | POA: Diagnosis present

## 2016-04-20 DIAGNOSIS — R7881 Bacteremia: Secondary | ICD-10-CM

## 2016-04-20 DIAGNOSIS — I10 Essential (primary) hypertension: Secondary | ICD-10-CM

## 2016-04-20 DIAGNOSIS — I152 Hypertension secondary to endocrine disorders: Secondary | ICD-10-CM | POA: Diagnosis present

## 2016-04-20 DIAGNOSIS — D72829 Elevated white blood cell count, unspecified: Secondary | ICD-10-CM

## 2016-04-20 HISTORY — DX: Bacteremia: R78.81

## 2016-04-20 HISTORY — DX: Essential (primary) hypertension: I10

## 2016-04-20 HISTORY — DX: Sepsis due to Escherichia coli (e. coli): A41.51

## 2016-04-20 HISTORY — DX: Unspecified Escherichia coli (E. coli) as the cause of diseases classified elsewhere: B96.20

## 2016-04-20 LAB — COMPREHENSIVE METABOLIC PANEL
ALBUMIN: 2.7 g/dL — AB (ref 3.5–5.0)
ALT: 128 U/L — AB (ref 17–63)
ANION GAP: 8 (ref 5–15)
AST: 200 U/L — ABNORMAL HIGH (ref 15–41)
Alkaline Phosphatase: 201 U/L — ABNORMAL HIGH (ref 38–126)
BUN: 17 mg/dL (ref 6–20)
CHLORIDE: 107 mmol/L (ref 101–111)
CO2: 21 mmol/L — AB (ref 22–32)
CREATININE: 1.04 mg/dL (ref 0.61–1.24)
Calcium: 7.9 mg/dL — ABNORMAL LOW (ref 8.9–10.3)
GFR calc non Af Amer: >60 mL/min (ref >60)
GLUCOSE: 162 mg/dL — AB (ref 65–99)
Potassium: 3.9 mmol/L (ref 3.5–5.1)
SODIUM: 136 mmol/L (ref 135–145)
Total Bilirubin: 4.2 mg/dL — ABNORMAL HIGH (ref 0.3–1.2)
Total Protein: 5.2 g/dL — ABNORMAL LOW (ref 6.5–8.1)

## 2016-04-20 LAB — CBC WITH DIFFERENTIAL/PLATELET
BASOS PCT: 0 %
Basophils Absolute: 0 10*3/uL (ref 0.0–0.1)
EOS PCT: 0 %
Eosinophils Absolute: 0 10*3/uL (ref 0.0–0.7)
HCT: 26.9 % — ABNORMAL LOW (ref 39.0–52.0)
HEMOGLOBIN: 8.6 g/dL — AB (ref 13.0–17.0)
LYMPHS PCT: 3 %
Lymphs Abs: 0.4 10*3/uL — ABNORMAL LOW (ref 0.7–4.0)
MCH: 25.3 pg — AB (ref 26.0–34.0)
MCHC: 32 g/dL (ref 30.0–36.0)
MCV: 79.1 fL (ref 78.0–100.0)
Monocytes Absolute: 1.2 10*3/uL — ABNORMAL HIGH (ref 0.1–1.0)
Monocytes Relative: 8 %
NEUTROS PCT: 89 %
Neutro Abs: 13.1 10*3/uL — ABNORMAL HIGH (ref 1.7–7.7)
Platelets: 353 10*3/uL (ref 150–400)
RBC: 3.4 MIL/uL — ABNORMAL LOW (ref 4.22–5.81)
RDW: 16.7 % — ABNORMAL HIGH (ref 11.5–15.5)
WBC MORPHOLOGY: INCREASED
WBC: 14.7 10*3/uL — ABNORMAL HIGH (ref 4.0–10.5)

## 2016-04-20 LAB — BLOOD CULTURE ID PANEL (REFLEXED)
ACINETOBACTER BAUMANNII: NOT DETECTED
CANDIDA ALBICANS: NOT DETECTED
CANDIDA GLABRATA: NOT DETECTED
CANDIDA PARAPSILOSIS: NOT DETECTED
CANDIDA TROPICALIS: NOT DETECTED
Candida krusei: NOT DETECTED
Carbapenem resistance: NOT DETECTED
ENTEROBACTER CLOACAE COMPLEX: NOT DETECTED
ESCHERICHIA COLI: DETECTED — AB
Enterobacteriaceae species: DETECTED — AB
Enterococcus species: NOT DETECTED
Haemophilus influenzae: NOT DETECTED
KLEBSIELLA PNEUMONIAE: NOT DETECTED
Klebsiella oxytoca: NOT DETECTED
Listeria monocytogenes: NOT DETECTED
NEISSERIA MENINGITIDIS: NOT DETECTED
PROTEUS SPECIES: NOT DETECTED
PSEUDOMONAS AERUGINOSA: NOT DETECTED
STREPTOCOCCUS AGALACTIAE: NOT DETECTED
STREPTOCOCCUS PYOGENES: NOT DETECTED
Serratia marcescens: NOT DETECTED
Staphylococcus aureus (BCID): NOT DETECTED
Staphylococcus species: NOT DETECTED
Streptococcus pneumoniae: NOT DETECTED
Streptococcus species: NOT DETECTED

## 2016-04-20 LAB — LACTIC ACID, PLASMA
Lactic Acid, Venous: 1.5 mmol/L (ref 0.5–1.9)
Lactic Acid, Venous: 2.4 mmol/L (ref 0.5–1.9)
Lactic Acid, Venous: 4 mmol/L (ref 0.5–1.9)

## 2016-04-20 LAB — GLUCOSE, CAPILLARY
GLUCOSE-CAPILLARY: 153 mg/dL — AB (ref 65–99)
GLUCOSE-CAPILLARY: 136 mg/dL — AB (ref 65–99)
Glucose-Capillary: 122 mg/dL — ABNORMAL HIGH (ref 65–99)
Glucose-Capillary: 154 mg/dL — ABNORMAL HIGH (ref 65–99)

## 2016-04-20 MED ORDER — INFLUENZA VAC SPLIT QUAD 0.5 ML IM SUSY: 0.5000 mL | PREFILLED_SYRINGE | INTRAMUSCULAR | Status: DC

## 2016-04-20 MED ORDER — ALUM & MAG HYDROXIDE-SIMETH 200-200-20 MG/5ML PO SUSP
30.0000 mL | ORAL | Status: DC | PRN
Start: 2016-04-20 — End: 2016-04-24
  Administered 2016-04-20 (×2): 30 mL via ORAL
  Filled 2016-04-20 (×2): qty 30

## 2016-04-20 MED ORDER — IOPAMIDOL (ISOVUE-370) INJECTION 76%
80.0000 mL | Freq: Once | INTRAVENOUS | Status: AC | PRN
  Administered 2016-04-20: 80 mL via INTRAVENOUS

## 2016-04-20 MED ORDER — IOPAMIDOL (ISOVUE-370) INJECTION 76%
INTRAVENOUS | Status: AC
  Filled 2016-04-20: qty 100

## 2016-04-20 MED ORDER — ORAL CARE MOUTH RINSE
15.0000 mL | Freq: Two times a day (BID) | OROMUCOSAL | Status: DC
  Administered 2016-04-20: 15 mL via OROMUCOSAL

## 2016-04-20 MED ORDER — SODIUM CHLORIDE 0.9 % IV BOLUS (SEPSIS)
500.0000 mL | Freq: Once | INTRAVENOUS | Status: AC
  Administered 2016-04-20: 500 mL via INTRAVENOUS

## 2016-04-20 NOTE — Progress Notes (Addendum)
Patient ID: [name], male   DOB: [birth year], 81 y.o.   MRN: [id number]  PROGRESS NOTE    [name]  [id number] DOB: 1935/05/13 DOA: 04/19/2016  PCP: Eliezer Lofts, MD   Brief Narrative:  81 y.o. male with past medical history significant of CAD, hyperlipidemia, hypertension, DJD, GERD, hiatal hernia, recent left total knee replacement. He presented to [facility] due to abdominal discomfort for which he took antiacids and then Rolaids without any significant relief. He reported pain radiating to chest. EMS arrived to his home and patient's BP was in 80's but has improved to 100/50 with bolus of NS. Pt was also given aspirin 325 mg and his chest pain improved.  In ED, T max was 103.4 F, HR 51-134, RR 14-31, oxygen saturation 86% on room air but has improved to 100% with [place] oxygen support. Blood work was notable for for WBC count 10.6, hemoglobin 9.3, lactic acid 4.0. CXR showed low lung volumes, no acute consolidation. CT angio chest ruled out PE but there was suspected minimal pulmonary edema and distended gallbladder. UA showed some leukocytes and WBC. Pt was started on vanco and zosyn for sepsis.   Assessment & Plan:   Sepsis due to E.Coli ([place]) / Leukocytosis / E.Coli bacteremia - Sepsis crietria emt on admission with fever, tachycardia, tachynea, hypoxia, leukocytosi and lactic acidosis - Source of infection E.Coli bacteremia, likely form possible UTI - Urine culture pending - Repeat blood cultures in am to ensure clearance of bacteremia - Continue zosyn, stop vanco  - Continue to monitor in SDU   Active Problems: Acute encephalopathy - Initially oriented this am, later this afternoon mildly disoriented to place - His mental status subsequently improved - If further changes in mental status will obtain CT head   Acute respiratory failure with hypoxia - Stable resp status - CT angio chest ruled out PE  Dyslipidemia associated with type 2 DM - Holding Crestor due to elevated  LFT's   Essential hypertension  - Continue metoprolol 37.5 mg po BID. - Continue lisinopril 10 mg po daily - Continue imdur 15 mg daily   Coronary atherosclerosis - Continue aspirin, clopidogrel   Normocytic anemia - Monitor daily CBC - Hemoglobin 8.6 this am  GERD - Continue Pantoprazole 40 mg po daily.  S/P total knee replacement - Stable from ortho standpoint and less likely source of infection - Continue pain management efforts    DVT prophylaxis: Lovenox subQ Code Status: full code  Family Communication: left message on son's cell number to call me back for updates  Disposition Plan: remains in SDU due to sepsis    Consultants:   None   Procedures:   None   Antimicrobials:   Vanco 04/19/2016 --> 04/20/2016   Zosyn 04/19/2016 -->    Subjective: No overnight events. This am more confused but mental status resolved to within baseline spontaneously in 20 minutes or so.   Objective: Vitals:   04/20/16 0144 04/20/16 0345 04/20/16 0724 04/20/16 1203  BP: 131/69 117/70 127/64 (!) 180/72  Pulse: 96 90 79 86  Resp:  20 19 (!) 24  Temp:  97.9 F (36.6 C) 97.6 F (36.4 C) 98.4 F (36.9 C)  TempSrc:  Oral Oral Oral  SpO2: 93% 98% 97% 96%  Weight:      Height:        Intake/Output Summary (Last 24 hours) at 04/20/16 1217 Last data filed at 04/20/16 0900  Gross per 24 hour  Intake  3538.75 ml  Output              950 ml  Net          2588.75 ml   Filed Weights   04/19/16 1931 04/19/16 2330  Weight: 86.2 kg (190 lb) 88 kg (194 lb 0.1 oz)    Examination:  General exam: Appears calm and comfortable  Respiratory system: Clear to auscultation. Respiratory effort normal. Cardiovascular system: S1 & S2 heard, Rate controlled  Gastrointestinal system: Abdomen is nondistended, soft and nontender. No organomegaly or masses felt. Normal bowel sounds heard. Central nervous system: Alert and oriented. No focal neurological deficits. Extremities:  Symmetric 5 x 5 power. Skin: No rashes, lesions or ulcers Psychiatry: Judgement and insight appear normal. Mood & affect appropriate.   Data Reviewed: I have personally reviewed following labs and imaging studies  CBC:  Recent Labs Lab 04/19/16 1850 04/19/16 1856 04/20/16 0252  WBC 10.6*  --  14.7*  NEUTROABS 9.7*  --  13.1*  HGB 9.3* 10.2* 8.6*  HCT 29.3* 30.0* 26.9*  MCV 79.2  --  79.1  PLT 386  --  0000000   Basic Metabolic Panel:  Recent Labs Lab 04/19/16 1850 04/19/16 1856 04/20/16 0252  NA 134* 134* 136  K 4.2 4.2 3.9  CL 102 100* 107  CO2 22  --  21*  GLUCOSE 170* 166* 162*  BUN 18 23* 17  CREATININE 1.10 1.00 1.04  CALCIUM 8.4*  --  7.9*  MG 2.0  --   --   PHOS 3.5  --   --    GFR: Estimated Creatinine Clearance: 61 mL/min (by C-G formula based on SCr of 1.04 mg/dL). Liver Function Tests:  Recent Labs Lab 04/19/16 1850 04/20/16 0252  AST 165* 200*  ALT 70* 128*  ALKPHOS 168* 201*  BILITOT 2.5* 4.2*  PROT 6.5 5.2*  ALBUMIN 3.2* 2.7*   No results for input(s): LIPASE, AMYLASE in the last 168 hours. No results for input(s): AMMONIA in the last 168 hours. Coagulation Profile: No results for input(s): INR, PROTIME in the last 168 hours. Cardiac Enzymes: No results for input(s): CKTOTAL, CKMB, CKMBINDEX, TROPONINI in the last 168 hours. BNP (last 3 results) No results for input(s): PROBNP in the last 8760 hours. HbA1C: No results for input(s): HGBA1C in the last 72 hours. CBG:  Recent Labs Lab 04/19/16 2359 04/20/16 0833 04/20/16 1201  GLUCAP 197* 122* 153*   Lipid Profile: No results for input(s): CHOL, HDL, LDLCALC, TRIG, CHOLHDL, LDLDIRECT in the last 72 hours. Thyroid Function Tests: No results for input(s): TSH, T4TOTAL, FREET4, T3FREE, THYROIDAB in the last 72 hours. Anemia Panel: No results for input(s): VITAMINB12, FOLATE, FERRITIN, TIBC, IRON, RETICCTPCT in the last 72 hours. Urine analysis:    Component Value Date/Time    COLORURINE ORANGE (A) 04/19/2016 1935   APPEARANCEUR CLEAR 04/19/2016 1935   LABSPEC 1.020 04/19/2016 1935   PHURINE 6.0 04/19/2016 1935   GLUCOSEU NEGATIVE 04/19/2016 [place] NEGATIVE 04/19/2016 1935   BILIRUBINUR SMALL (A) 04/19/2016 [place] NEGATIVE 04/19/2016 1935   PROTEINUR 30 (A) 04/19/2016 1935   UROBILINOGEN 0.2 10/31/2014 2254   NITRITE NEGATIVE 04/19/2016 1935   LEUKOCYTESUR SMALL (A) 04/19/2016 1935   Sepsis Labs: @LABRCNTIP (procalcitonin:4,lacticidven:4)   ) Recent Results (from the past 240 hour(s))  Culture, blood (Routine X 2) w Reflex to ID Panel     Status: None (Preliminary result)   Collection Time: 04/19/16  7:20 PM  Result Value  Ref Range Status   Specimen Description BLOOD RIGHT ANTECUBITAL  Final   Special Requests BOTTLES DRAWN AEROBIC AND ANAEROBIC 5CC  Final   Culture  Setup Time   Final    GRAM NEGATIVE RODS IN BOTH AEROBIC AND ANAEROBIC BOTTLES Organism ID to follow    Culture GRAM NEGATIVE RODS  Final   Report Status PENDING  Incomplete      Radiology Studies: Ct Angio Chest Pe W Or Wo Contrast Result Date: 04/20/2016 1. No pulmonary embolus. 2. Suspect minimal pulmonary edema. 3. Moderate-sized hiatal hernia. 4. Distended gallbladder is partially included in the upper abdomen. If there are any right upper quadrant symptoms or clinical concern for biliary pathology, recommend ultrasound evaluation. Electronically Signed   By: Jeb Levering M.D.   On: 04/20/2016 05:37   Dg Chest Port 1 View Result Date: 04/19/2016 1. Low lung volumes. 2. Mild cardiomegaly with central vascular congestion and findings suggestive of mild perihilar edema. 3. No acute consolidation 4. Atherosclerotic vascular calcification of the aorta Electronically Signed   By: Donavan Foil M.D.   On: 04/19/2016 20:03     Scheduled Meds: . aspirin  325 mg Oral BID  . clopidogrel  75 mg Oral Daily  . enoxaparin (LOVENOX) injection  40 mg Subcutaneous Q24H  .  [START ON 04/21/2016] Influenza vac split quadrivalent PF  0.5 mL Intramuscular Tomorrow-1000  . iopamidol      . isosorbide mononitrate  15 mg Oral q morning - 10a  . lisinopril  10 mg Oral Daily  . mouth rinse  15 mL Mouth Rinse BID  . metoprolol tartrate  37.5 mg Oral BID  . pantoprazole  40 mg Oral Daily  . piperacillin-tazobactam (ZOSYN)  IV  3.375 g Intravenous Q8H  . sodium chloride flush  3 mL Intravenous Q12H  . vancomycin  1,000 mg Intravenous Q12H   Continuous Infusions: . sodium chloride 125 mL/hr at 04/20/16 0615     LOS: 1 day    Time spent: 25 minutes  Greater than 50% of the time spent on counseling and coordinating the care.   Leisa Lenz, MD [facility] Pager [phone]  If 7PM-7AM, please contact night-coverage [email or web address] Password [email or web address] 04/20/2016, 12:17 PM

## 2016-04-20 NOTE — Care Management Note (Signed)
Case Management Note  Patient Details  Name: [name] MRN: [id number] Date of Birth: January 26, 1935  Subjective/Objective:   Presents with sepsis, Patient is s/p TKR recently, went home with HHPT and has DME at home, he is active with [facility] for [place] per Dian Situ at [place] ([facility] was not in network with patient's insurance).  Patient will like to resume HHPT with [facility] at dc.  NCM  Will cont to follow for dc needs.                Action/Plan:   Expected Discharge Date:                  Expected Discharge Plan:  [place]  In-House Referral:     Discharge planning Services  CM Consult  Post Acute Care Choice:  Home Health, Resumption of Svcs/PTA Provider Choice offered to:  Patient  DME Arranged:    DME Agency:     HH Arranged:  PT [place]:  [facility]  Status of Service:  In process, will continue to follow  If discussed at Long Length of Stay Meetings, dates discussed:    Additional Comments:  Zenon Mayo, RN 04/20/2016, 4:46 PM

## 2016-04-20 NOTE — Progress Notes (Signed)
PHARMACY - PHYSICIAN COMMUNICATION CRITICAL VALUE ALERT - BLOOD CULTURE IDENTIFICATION (BCID)  Results for orders placed or performed during the hospital encounter of 04/19/16  Blood Culture ID Panel (Reflexed) (Collected: 04/19/2016  7:20 PM)  Result Value Ref Range   Enterococcus species NOT DETECTED NOT DETECTED   Listeria monocytogenes NOT DETECTED NOT DETECTED   Staphylococcus species NOT DETECTED NOT DETECTED   Staphylococcus aureus NOT DETECTED NOT DETECTED   Streptococcus species NOT DETECTED NOT DETECTED   Streptococcus agalactiae NOT DETECTED NOT DETECTED   Streptococcus pneumoniae NOT DETECTED NOT DETECTED   Streptococcus pyogenes NOT DETECTED NOT DETECTED   Acinetobacter baumannii NOT DETECTED NOT DETECTED   Enterobacteriaceae species DETECTED (A) NOT DETECTED   Enterobacter cloacae complex NOT DETECTED NOT DETECTED   Escherichia coli DETECTED (A) NOT DETECTED   Klebsiella oxytoca NOT DETECTED NOT DETECTED   Klebsiella pneumoniae NOT DETECTED NOT DETECTED   Proteus species NOT DETECTED NOT DETECTED   Serratia marcescens NOT DETECTED NOT DETECTED   Carbapenem resistance NOT DETECTED NOT DETECTED   Haemophilus influenzae NOT DETECTED NOT DETECTED   Neisseria meningitidis NOT DETECTED NOT DETECTED   Pseudomonas aeruginosa NOT DETECTED NOT DETECTED   Candida albicans NOT DETECTED NOT DETECTED   Candida glabrata NOT DETECTED NOT DETECTED   Candida krusei NOT DETECTED NOT DETECTED   Candida parapsilosis NOT DETECTED NOT DETECTED   Candida tropicalis NOT DETECTED NOT DETECTED    Name of physician (or Provider) Contacted: Dr Charlies Silvers  Changes to prescribed antibiotics required: DC Vanc  Levester Fresh, PharmD, BCPS, [facility] Clinical Pharmacist Pager [phone] 04/20/2016 1:12 PM

## 2016-04-21 ENCOUNTER — Inpatient Hospital Stay (HOSPITAL_COMMUNITY): Payer: Medicare HMO

## 2016-04-21 DIAGNOSIS — R945 Abnormal results of liver function studies: Secondary | ICD-10-CM | POA: Diagnosis present

## 2016-04-21 DIAGNOSIS — D72828 Other elevated white blood cell count: Secondary | ICD-10-CM

## 2016-04-21 DIAGNOSIS — Z951 Presence of aortocoronary bypass graft: Secondary | ICD-10-CM

## 2016-04-21 DIAGNOSIS — R7989 Other specified abnormal findings of blood chemistry: Secondary | ICD-10-CM | POA: Diagnosis present

## 2016-04-21 DIAGNOSIS — D649 Anemia, unspecified: Secondary | ICD-10-CM

## 2016-04-21 DIAGNOSIS — Z96659 Presence of unspecified artificial knee joint: Secondary | ICD-10-CM

## 2016-04-21 DIAGNOSIS — G9340 Encephalopathy, unspecified: Secondary | ICD-10-CM

## 2016-04-21 LAB — HEMOGLOBIN A1C
HEMOGLOBIN A1C: 6.6 % — AB (ref 4.8–5.6)
MEAN PLASMA GLUCOSE: 143 mg/dL

## 2016-04-21 LAB — CBC WITH DIFFERENTIAL/PLATELET
BASOS PCT: 0 %
Basophils Absolute: 0 10*3/uL (ref 0.0–0.1)
EOS ABS: 0.1 10*3/uL (ref 0.0–0.7)
EOS PCT: 0 %
HCT: 27.2 % — ABNORMAL LOW (ref 39.0–52.0)
HEMOGLOBIN: 8.7 g/dL — AB (ref 13.0–17.0)
LYMPHS ABS: 0.8 10*3/uL (ref 0.7–4.0)
Lymphocytes Relative: 6 %
MCH: 25.4 pg — AB (ref 26.0–34.0)
MCHC: 32 g/dL (ref 30.0–36.0)
MCV: 79.3 fL (ref 78.0–100.0)
MONO ABS: 1.3 10*3/uL — AB (ref 0.1–1.0)
MONOS PCT: 10 %
NEUTROS PCT: 84 %
Neutro Abs: 10.7 10*3/uL — ABNORMAL HIGH (ref 1.7–7.7)
Platelets: 379 10*3/uL (ref 150–400)
RBC: 3.43 MIL/uL — ABNORMAL LOW (ref 4.22–5.81)
RDW: 17.1 % — AB (ref 11.5–15.5)
WBC: 12.9 10*3/uL — ABNORMAL HIGH (ref 4.0–10.5)

## 2016-04-21 LAB — COMPREHENSIVE METABOLIC PANEL
ALK PHOS: 159 U/L — AB (ref 38–126)
ALT: 94 U/L — AB (ref 17–63)
AST: 85 U/L — AB (ref 15–41)
Albumin: 2.8 g/dL — ABNORMAL LOW (ref 3.5–5.0)
Anion gap: 8 (ref 5–15)
BUN: 14 mg/dL (ref 6–20)
CALCIUM: 8.1 mg/dL — AB (ref 8.9–10.3)
CHLORIDE: 109 mmol/L (ref 101–111)
CO2: 20 mmol/L — AB (ref 22–32)
CREATININE: 0.81 mg/dL (ref 0.61–1.24)
GFR calc non Af Amer: >60 mL/min (ref >60)
GLUCOSE: 118 mg/dL — AB (ref 65–99)
Potassium: 3.5 mmol/L (ref 3.5–5.1)
SODIUM: 137 mmol/L (ref 135–145)
Total Bilirubin: 4.7 mg/dL — ABNORMAL HIGH (ref 0.3–1.2)
Total Protein: 5.8 g/dL — ABNORMAL LOW (ref 6.5–8.1)

## 2016-04-21 LAB — GLUCOSE, CAPILLARY
GLUCOSE-CAPILLARY: 127 mg/dL — AB (ref 65–99)
GLUCOSE-CAPILLARY: 141 mg/dL — AB (ref 65–99)
GLUCOSE-CAPILLARY: 116 mg/dL — AB (ref 65–99)

## 2016-04-21 MED ORDER — FUROSEMIDE 10 MG/ML IJ SOLN
40.0000 mg | Freq: Every day | INTRAMUSCULAR | Status: DC
  Administered 2016-04-21 – 2016-04-23 (×3): 40 mg via INTRAVENOUS
  Filled 2016-04-21 (×3): qty 4

## 2016-04-21 MED ORDER — INFLUENZA VAC SPLIT QUAD 0.5 ML IM SUSY: 0.5000 mL | PREFILLED_SYRINGE | INTRAMUSCULAR | Status: AC | PRN

## 2016-04-21 NOTE — Progress Notes (Signed)
Orthopedic Tech Progress Note Patient Details:  [name] 03/12/1935 [id number]  CPM Left Knee CPM Left Knee: On Left Knee Flexion (Degrees): 90 Left Knee Extension (Degrees): 0   [name] 04/21/2016, 12:55 PM

## 2016-04-21 NOTE — Progress Notes (Addendum)
Patient ID: [name], male   DOB: [birth year], 81 y.o.   MRN: [id number]  PROGRESS NOTE    MOSTYN BUETER  [id number] DOB: [birth year] DOA: 04/19/2016  PCP: Eliezer Lofts, MD   Brief Narrative:  81 y.o. male with past medical history significant of CAD, hyperlipidemia, hypertension, DJD, GERD, hiatal hernia, recent left total knee replacement. He presented to [facility] due to abdominal discomfort for which he took antiacids and then Rolaids without any significant relief. He reported pain radiating to chest. EMS arrived to his home and patient's BP was in 80's but has improved to 100/50 with bolus of NS. Pt was also given aspirin 325 mg and his chest pain improved.  In ED, T max was 103.4 F, HR 51-134, RR 14-31, oxygen saturation 86% on room air but has improved to 100% with [place] oxygen support. Blood work was notable for for WBC count 10.6, hemoglobin 9.3, lactic acid 4.0. CXR showed low lung volumes, no acute consolidation. CT angio chest ruled out PE but there was suspected minimal pulmonary edema and distended gallbladder. UA showed some leukocytes and WBC. Pt was started on vanco and zosyn for sepsis.   Assessment & Plan:   Sepsis due to E.Coli ([place]) / Leukocytosis / E.Coli bacteremia - Sepsis crietria emt on admission with fever, tachycardia, tachynea, hypoxia, leukocytosi and lactic acidosis - Source of infection E.Coli bacteremia, likely form possible UTI - Urine culture preliminary report - gram negative rods  - Repeat blood cultures in this am to ensure clearance of bacteremia - Continue zosyn, stopped vanco 10/11 - Continue to monitor in SDU   Active Problems: Acute encephalopathy - Good mental status this am - If further changes in mental status will obtain CT head   Acute respiratory failure with hypoxia - Little more short of breath this am, likely from fluids received per sepsis protocol - CXR this am with CHF - Will start lasix 40 mg IV daily - 2 D ECHO in 10/2015 showed EF  60%, grade 2 DD - CT angio chest ruled out PE  Dyslipidemia associated with type 2 DM - Holding Crestor due to elevated LFT's   Elevated LFT's - AST 85, ALT 94, ALP 159 and TB 4.7 this am, improved since yesterday - Will obtain abd [place] for further evaluation of abn LFT's   Essential hypertension  - Continue metoprolol 37.5 mg po BID. - Continue lisinopril 10 mg po daily - Continue imdur 15 mg daily   Coronary atherosclerosis - Continue aspirin, clopidogrel   Normocytic anemia - Monitor daily CBC - Hemoglobin 8.7 this am, overall stable   GERD - Continue Pantoprazole 40 mg po daily.  S/P total knee replacement - Stable from ortho standpoint and less likely source of infection - Continue pain management efforts    DVT prophylaxis: Lovenox subQ Code Status: full code  Family Communication: spoke with pt son over the phone today  Disposition Plan: remains in SDU due to sepsis    Consultants:   None   Procedures:   None   Antimicrobials:   Vanco 04/19/2016 --> 04/20/2016   Zosyn 04/19/2016 -->    Subjective: Short of breath this am but not in distress.   Objective: Vitals:   04/20/16 2338 04/21/16 0251 04/21/16 0700 04/21/16 0805  BP:  (!) 146/75 (!) 185/95 (!) 176/88  Pulse:  84 93 96  Resp:   (!) 23 (!) 29  Temp: 98 F (36.7 C) 98.1 F (36.7 C) 97.8 F (36.6 C)  TempSrc: Oral Oral Oral   SpO2:  97% 95% 95%  Weight:      Height:        Intake/Output Summary (Last 24 hours) at 04/21/16 0944 Last data filed at 04/21/16 0900  Gross per 24 hour  Intake          3598.75 ml  Output              625 ml  Net          2973.75 ml   Filed Weights   04/19/16 1931 04/19/16 2330  Weight: 86.2 kg (190 lb) 88 kg (194 lb 0.1 oz)    Examination:  General exam: Appears calm and comfortable, no distress  Respiratory system: Clear to auscultation. No wheezing  Cardiovascular system: S1 & S2 heard, Rate controlled  Gastrointestinal system: (+) BS, non  tender  Central nervous system: No focal neurological deficits. Extremities: Symmetric 5 x 5 power. No edema  Skin: warm, dry Psychiatry: Mood & affect normal   Data Reviewed: I have personally reviewed following labs and imaging studies  CBC:  Recent Labs Lab 04/19/16 1850 04/19/16 1856 04/20/16 0252 04/21/16 0522  WBC 10.6*  --  14.7* 12.9*  NEUTROABS 9.7*  --  13.1* 10.7*  HGB 9.3* 10.2* 8.6* 8.7*  HCT 29.3* 30.0* 26.9* 27.2*  MCV 79.2  --  79.1 79.3  PLT 386  --  353 [id number]   Basic Metabolic Panel:  Recent Labs Lab 04/19/16 1850 04/19/16 1856 04/20/16 0252 04/21/16 0522  NA 134* 134* 136 137  K 4.2 4.2 3.9 3.5  CL 102 100* 107 109  CO2 22  --  21* 20*  GLUCOSE 170* 166* 162* 118*  BUN 18 23* 17 14  CREATININE 1.10 1.00 1.04 0.81  CALCIUM 8.4*  --  7.9* 8.1*  MG 2.0  --   --   --   PHOS 3.5  --   --   --    GFR: Estimated Creatinine Clearance: 78.3 mL/min (by C-G formula based on SCr of 0.81 mg/dL). Liver Function Tests:  Recent Labs Lab 04/19/16 1850 04/20/16 0252 04/21/16 0522  AST 165* 200* 85*  ALT 70* 128* 94*  ALKPHOS 168* 201* 159*  BILITOT 2.5* 4.2* 4.7*  PROT 6.5 5.2* 5.8*  ALBUMIN 3.2* 2.7* 2.8*   No results for input(s): LIPASE, AMYLASE in the last 168 hours. No results for input(s): AMMONIA in the last 168 hours. Coagulation Profile: No results for input(s): INR, PROTIME in the last 168 hours. Cardiac Enzymes: No results for input(s): CKTOTAL, CKMB, CKMBINDEX, TROPONINI in the last 168 hours. BNP (last 3 results) No results for input(s): PROBNP in the last 8760 hours. HbA1C:  Recent Labs  04/20/16 0252  HGBA1C 6.6*   CBG:  Recent Labs Lab 04/20/16 0833 04/20/16 1201 04/20/16 1715 04/20/16 2156 04/21/16 0800  GLUCAP 122* 153* 136* 154* 127*   Lipid Profile: No results for input(s): CHOL, HDL, LDLCALC, TRIG, CHOLHDL, LDLDIRECT in the last 72 hours. Thyroid Function Tests: No results for input(s): TSH, T4TOTAL, FREET4,  T3FREE, THYROIDAB in the last 72 hours. Anemia Panel: No results for input(s): VITAMINB12, FOLATE, FERRITIN, TIBC, IRON, RETICCTPCT in the last 72 hours. Urine analysis:    Component Value Date/Time   COLORURINE ORANGE (A) 04/19/2016 1935   APPEARANCEUR CLEAR 04/19/2016 1935   LABSPEC 1.020 04/19/2016 1935   PHURINE 6.0 04/19/2016 1935   GLUCOSEU NEGATIVE 04/19/2016 [place] NEGATIVE 04/19/2016 1935   BILIRUBINUR SMALL (A)  04/19/2016 [place] 04/19/2016 1935   PROTEINUR 30 (A) 04/19/2016 1935   UROBILINOGEN 0.2 10/31/2014 2254   NITRITE NEGATIVE 04/19/2016 1935   LEUKOCYTESUR SMALL (A) 04/19/2016 1935   Sepsis Labs: @LABRCNTIP (procalcitonin:4,lacticidven:4)    Recent Results (from the past 240 hour(s))  Culture, blood (Routine X 2) w Reflex to ID Panel     Status: None (Preliminary result)   Collection Time: 04/19/16  7:20 PM  Result Value Ref Range Status   Specimen Description BLOOD RIGHT ANTECUBITAL  Final   Special Requests BOTTLES DRAWN AEROBIC AND ANAEROBIC 5CC  Final   Culture  Setup Time   Final    GRAM NEGATIVE RODS IN BOTH AEROBIC AND ANAEROBIC BOTTLES Organism ID to follow CRITICAL RESULT CALLED TO, READ BACK BY AND VERIFIED WITH: Hughie Closs Pharm.D. 12:20 04/20/16 ([email or web address])    Culture GRAM NEGATIVE RODS  Final   Report Status PENDING  Incomplete  Blood Culture ID Panel (Reflexed)     Status: Abnormal   Collection Time: 04/19/16  7:20 PM  Result Value Ref Range Status   Enterococcus species NOT DETECTED NOT DETECTED Final   Listeria monocytogenes NOT DETECTED NOT DETECTED Final   Staphylococcus species NOT DETECTED NOT DETECTED Final   Staphylococcus aureus NOT DETECTED NOT DETECTED Final   Streptococcus species NOT DETECTED NOT DETECTED Final   Streptococcus agalactiae NOT DETECTED NOT DETECTED Final   Streptococcus pneumoniae NOT DETECTED NOT DETECTED Final   Streptococcus pyogenes NOT DETECTED NOT DETECTED Final   Acinetobacter  baumannii NOT DETECTED NOT DETECTED Final   Enterobacteriaceae species DETECTED (A) NOT DETECTED Final    Comment: CRITICAL RESULT CALLED TO, READ BACK BY AND VERIFIED WITH: Hughie Closs Pharm.D. 12:20 04/20/16 ([email or web address])    Enterobacter cloacae complex NOT DETECTED NOT DETECTED Final   Escherichia coli DETECTED (A) NOT DETECTED Final    Comment: CRITICAL RESULT CALLED TO, READ BACK BY AND VERIFIED WITH: Hughie Closs Pharm.D. 12:20 04/20/16 ([email or web address])    Klebsiella oxytoca NOT DETECTED NOT DETECTED Final   Klebsiella pneumoniae NOT DETECTED NOT DETECTED Final   Proteus species NOT DETECTED NOT DETECTED Final   Serratia marcescens NOT DETECTED NOT DETECTED Final   Carbapenem resistance NOT DETECTED NOT DETECTED Final   Haemophilus influenzae NOT DETECTED NOT DETECTED Final   Neisseria meningitidis NOT DETECTED NOT DETECTED Final   Pseudomonas aeruginosa NOT DETECTED NOT DETECTED Final   Candida albicans NOT DETECTED NOT DETECTED Final   Candida glabrata NOT DETECTED NOT DETECTED Final   Candida krusei NOT DETECTED NOT DETECTED Final   Candida parapsilosis NOT DETECTED NOT DETECTED Final   Candida tropicalis NOT DETECTED NOT DETECTED Final  Culture, blood (Routine X 2) w Reflex to ID Panel     Status: None (Preliminary result)   Collection Time: 04/19/16  7:22 PM  Result Value Ref Range Status   Specimen Description BLOOD LEFT ANTECUBITAL  Final   Special Requests BOTTLES DRAWN AEROBIC AND ANAEROBIC 5CC  Final   Culture NO GROWTH < 24 HOURS  Final   Report Status PENDING  Incomplete  Urine culture     Status: Abnormal (Preliminary result)   Collection Time: 04/19/16  7:35 PM  Result Value Ref Range Status   Specimen Description URINE, RANDOM  Final   Special Requests NONE  Final   Culture 30,000 COLONIES/mL GRAM NEGATIVE RODS (A)  Final   Report Status PENDING  Incomplete      Radiology Studies: Dg Chest Port 1 View Result Date: 04/21/2016 Congestive  heart failure with overall  increase in pulmonary edema. No airspace consolidation. There is aortic atherosclerosis.   Ct Angio Chest Pe W Or Wo Contrast Result Date: 04/20/2016 1. No pulmonary embolus. 2. Suspect minimal pulmonary edema. 3. Moderate-sized hiatal hernia. 4. Distended gallbladder is partially included in the upper abdomen. If there are any right upper quadrant symptoms or clinical concern for biliary pathology, recommend ultrasound evaluation. Electronically Signed   By: Jeb Levering M.D.   On: 04/20/2016 05:37   Dg Chest Port 1 View Result Date: 04/19/2016 1. Low lung volumes. 2. Mild cardiomegaly with central vascular congestion and findings suggestive of mild perihilar edema. 3. No acute consolidation 4. Atherosclerotic vascular calcification of the aorta     Scheduled Meds: . aspirin  325 mg Oral BID  . clopidogrel  75 mg Oral Daily  . enoxaparin (LOVENOX) injection  40 mg Subcutaneous Q24H  . furosemide  40 mg Intravenous Daily  . Influenza vac split quadrivalent PF  0.5 mL Intramuscular Tomorrow-1000  . isosorbide mononitrate  15 mg Oral q morning - 10a  . lisinopril  10 mg Oral Daily  . metoprolol tartrate  37.5 mg Oral BID  . pantoprazole  40 mg Oral Daily  . piperacillin-tazobactam (ZOSYN)  IV  3.375 g Intravenous Q8H  . sodium chloride flush  3 mL Intravenous Q12H   Continuous Infusions:     LOS: 2 days    Time spent: 25 minutes  Greater than 50% of the time spent on counseling and coordinating the care.   Leisa Lenz, MD [facility] Pager [phone]  If 7PM-7AM, please contact night-coverage [email or web address] Password [facility] 04/21/2016, 9:44 AM

## 2016-04-22 DIAGNOSIS — R7989 Other specified abnormal findings of blood chemistry: Secondary | ICD-10-CM

## 2016-04-22 DIAGNOSIS — I2510 Atherosclerotic heart disease of native coronary artery without angina pectoris: Secondary | ICD-10-CM

## 2016-04-22 DIAGNOSIS — Z9861 Coronary angioplasty status: Secondary | ICD-10-CM

## 2016-04-22 LAB — COMPREHENSIVE METABOLIC PANEL
ALBUMIN: 2.7 g/dL — AB (ref 3.5–5.0)
ALT: 72 U/L — ABNORMAL HIGH (ref 17–63)
ANION GAP: 8 (ref 5–15)
AST: 49 U/L — AB (ref 15–41)
Alkaline Phosphatase: 163 U/L — ABNORMAL HIGH (ref 38–126)
BILIRUBIN TOTAL: 2.6 mg/dL — AB (ref 0.3–1.2)
BUN: 12 mg/dL (ref 6–20)
CHLORIDE: 104 mmol/L (ref 101–111)
CO2: 26 mmol/L (ref 22–32)
Calcium: 8.2 mg/dL — ABNORMAL LOW (ref 8.9–10.3)
Creatinine, Ser: 0.85 mg/dL (ref 0.61–1.24)
GFR calc Af Amer: >60 mL/min (ref >60)
Glucose, Bld: 139 mg/dL — ABNORMAL HIGH (ref 65–99)
POTASSIUM: 3 mmol/L — AB (ref 3.5–5.1)
Sodium: 138 mmol/L (ref 135–145)
TOTAL PROTEIN: 5.8 g/dL — AB (ref 6.5–8.1)

## 2016-04-22 LAB — CULTURE, BLOOD (ROUTINE X 2)

## 2016-04-22 LAB — GLUCOSE, CAPILLARY
GLUCOSE-CAPILLARY: 103 mg/dL — AB (ref 65–99)
Glucose-Capillary: 173 mg/dL — ABNORMAL HIGH (ref 65–99)

## 2016-04-22 LAB — CBC
HEMATOCRIT: 26.8 % — AB (ref 39.0–52.0)
HEMOGLOBIN: 8.5 g/dL — AB (ref 13.0–17.0)
MCH: 24.9 pg — ABNORMAL LOW (ref 26.0–34.0)
MCHC: 31.7 g/dL (ref 30.0–36.0)
MCV: 78.4 fL (ref 78.0–100.0)
Platelets: 372 10*3/uL (ref 150–400)
RBC: 3.42 MIL/uL — ABNORMAL LOW (ref 4.22–5.81)
RDW: 17 % — AB (ref 11.5–15.5)
WBC: 9.7 10*3/uL (ref 4.0–10.5)

## 2016-04-22 LAB — URINE CULTURE: Culture: 30000 — AB

## 2016-04-22 MED ORDER — POTASSIUM CHLORIDE CRYS ER 20 MEQ PO TBCR
40.0000 meq | EXTENDED_RELEASE_TABLET | Freq: Once | ORAL | Status: AC
  Administered 2016-04-22: 40 meq via ORAL
  Filled 2016-04-22: qty 2

## 2016-04-22 MED ORDER — POTASSIUM CHLORIDE CRYS ER 10 MEQ PO TBCR
30.0000 meq | EXTENDED_RELEASE_TABLET | Freq: Once | ORAL | Status: AC
  Administered 2016-04-22: 30 meq via ORAL
  Filled 2016-04-22: qty 1

## 2016-04-22 MED ORDER — DEXTROSE 5 % IV SOLN
2.0000 g | INTRAVENOUS | Status: DC
  Administered 2016-04-22 – 2016-04-23 (×2): 2 g via INTRAVENOUS
  Filled 2016-04-22 (×4): qty 2

## 2016-04-22 NOTE — Progress Notes (Addendum)
[name], male   DOB: 1934-10-30, 81 y.o.   MRN: [place]:[id number]  PROGRESS NOTE    ODYN SIAM  [id number] DOB: May 23, 1935 DOA: 04/19/2016  PCP: Eliezer Lofts, MD   Brief Narrative:  81 y.o. male with past medical history significant of CAD, hyperlipidemia, hypertension, DJD, GERD, hiatal hernia, recent left total knee replacement. He presented to [facility] due to abdominal discomfort for which he took antiacids and then Rolaids without any significant relief. He reported pain radiating to chest. EMS arrived to his home and patient's BP was in 80's but has improved to 100/50 with bolus of NS. Pt was also given aspirin 325 mg and his chest pain improved.  In ED, T max was 103.4 F, HR 51-134, RR 14-31, oxygen saturation 86% on room air but has improved to 100% with [place] oxygen support. Blood work was notable for for WBC count 10.6, hemoglobin 9.3, lactic acid 4.0. CXR showed low lung volumes, no acute consolidation. CT angio chest ruled out PE but there was suspected minimal pulmonary edema and distended gallbladder. UA showed some leukocytes and WBC. Pt was started on vanco and zosyn for sepsis.  Patient was subsequently found to have Escherichia coli bacteremia. We also did workup for elevated LFTs but ultrasound did not show acute cholecystitis or gallstones.  Assessment & Plan:   Sepsis due to E.Coli ([place]) / Leukocytosis / E.Coli bacteremia / Klebsiella UTI - Sepsis crietria emt on admission with fever, tachycardia, tachynea, hypoxia, leukocytosi and lactic acidosis - Source of infection E.Coli bacteremia, likely form possible UTI - Urine culture is growing Klebsiella species - Repeat blood cultures 04/21/2016 are pending - Continue zosyn, stopped vanco 10/11 - Patient is hemodynamically stable at this point and can be transferred to telemetry floor today   Active Problems: Acute encephalopathy - Mental status at baseline, oriented to time, place and person - If further changes in  mental status will obtain CT head   Acute respiratory failure with hypoxia - More short of breath at 04/21/2016 so order placed for chest x-ray which showed congestive heart failure with increase in pulmonary edema but no airspace consolidation - Started IV Lasix 40 mg daily 04/21/2016. He feels better in regards to respiratory status - 2 D ECHO in 10/2015 showed EF 60%, grade 2 DD - CT angio chest ruled out PE  Dyslipidemia associated with type 2 DM - Holding Crestor due to elevated LFT's   Elevated LFT's - LFTs improving - Abdominal ultrasound did not show acute cholecystitis or gallstones. There is gallbladder distention however and septations versus cysts, per radiology report both conditions are rare but may be congenital or acquired or sequela of prior acute cholecystitis. - Patient is on Zosyn for sepsis due to Escherichia coli bacteremia.  Essential hypertension  - Continue metoprolol 37.5 mg po BID. - Continue lisinopril 10 mg po daily - Continue imdur 15 mg daily   Coronary atherosclerosis - Continue aspirin, clopidogrel   Normocytic anemia - Hemoglobin is 8.5, overall stable  Hypokalemia - Secondary to Lasix - Supplemented  GERD - Continue Pantoprazole 40 mg po daily.  S/P total knee replacement - Stable from ortho standpoint and less likely source of infection - Continue pain management efforts    DVT prophylaxis: Lovenox subQ Code Status: full code  Family Communication: spoke with pt son over the phone 10/12; left VM on pt wife cell phone with my cell number to call me back for updates  Disposition Plan: Transfer to telemetry  floor today   Consultants:   None   Procedures:   None   Antimicrobials:   Vanco 04/19/2016 --> 04/20/2016   Zosyn 04/19/2016 -->    Subjective: Feels better this morning.   Objective: Vitals:   04/21/16 2336 04/22/16 0333 04/22/16 0700 04/22/16 0742  BP: (!) 155/103 (!) 157/69 (!) 155/75 (!) 147/89  Pulse: 85  (!) 51 87 94  Resp: (!) 25 20 20  (!) 23  Temp: 99.5 F (37.5 C) 98.5 F (36.9 C) 98.5 F (36.9 C)   TempSrc: Axillary Oral Oral   SpO2: 97% 98% 97% 96%  Weight:      Height:        Intake/Output Summary (Last 24 hours) at 04/22/16 0946 Last data filed at 04/22/16 0600  Gross per 24 hour  Intake              510 ml  Output             4075 ml  Net            -3565 ml   Filed Weights   04/19/16 1931 04/19/16 2330  Weight: 86.2 kg (190 lb) 88 kg (194 lb 0.1 oz)    Examination:  General exam: No distress  Respiratory system: No wheezing, no rhonchi  Cardiovascular system: S1 & S2 heard, Rate controlled  Gastrointestinal system: (+) BS, Nontender and nondistended  Central nervous system: Nonfocal  Extremities: No swelling, pulses palpable  Skin: warm, dry, no lesions or ulcers  Psychiatry: Normal mood and behavior  Data Reviewed: I have personally reviewed following labs and imaging studies  CBC:  Recent Labs Lab 04/19/16 1850 04/19/16 1856 04/20/16 0252 04/21/16 0522 04/22/16 0337  WBC 10.6*  --  14.7* 12.9* 9.7  NEUTROABS 9.7*  --  13.1* 10.7*  --   HGB 9.3* 10.2* 8.6* 8.7* 8.5*  HCT 29.3* 30.0* 26.9* 27.2* 26.8*  MCV 79.2  --  79.1 79.3 78.4  PLT 386  --  353 379 [id number]   Basic Metabolic Panel:  Recent Labs Lab 04/19/16 1850 04/19/16 1856 04/20/16 0252 04/21/16 0522 04/22/16 0337  NA 134* 134* 136 137 138  K 4.2 4.2 3.9 3.5 3.0*  CL 102 100* 107 109 104  CO2 22  --  21* 20* 26  GLUCOSE 170* 166* 162* 118* 139*  BUN 18 23* 17 14 12   CREATININE 1.10 1.00 1.04 0.81 0.85  CALCIUM 8.4*  --  7.9* 8.1* 8.2*  MG 2.0  --   --   --   --   PHOS 3.5  --   --   --   --    GFR: Estimated Creatinine Clearance: 74.7 mL/min (by C-G formula based on SCr of 0.85 mg/dL). Liver Function Tests:  Recent Labs Lab 04/19/16 1850 04/20/16 0252 04/21/16 0522 04/22/16 0337  AST 165* 200* 85* 49*  ALT 70* 128* 94* 72*  ALKPHOS 168* 201* 159* 163*  BILITOT 2.5* 4.2*  4.7* 2.6*  PROT 6.5 5.2* 5.8* 5.8*  ALBUMIN 3.2* 2.7* 2.8* 2.7*   No results for input(s): LIPASE, AMYLASE in the last 168 hours. No results for input(s): AMMONIA in the last 168 hours. Coagulation Profile: No results for input(s): INR, PROTIME in the last 168 hours. Cardiac Enzymes: No results for input(s): CKTOTAL, CKMB, CKMBINDEX, TROPONINI in the last 168 hours. BNP (last 3 results) No results for input(s): PROBNP in the last 8760 hours. HbA1C:  Recent Labs  04/20/16 0252  HGBA1C 6.6*  CBG:  Recent Labs Lab 04/20/16 2156 04/21/16 0800 04/21/16 1211 04/21/16 1635 04/22/16 0748  GLUCAP 154* 127* 141* 116* 173*   Lipid Profile: No results for input(s): CHOL, HDL, LDLCALC, TRIG, CHOLHDL, LDLDIRECT in the last 72 hours. Thyroid Function Tests: No results for input(s): TSH, T4TOTAL, FREET4, T3FREE, THYROIDAB in the last 72 hours. Anemia Panel: No results for input(s): VITAMINB12, FOLATE, FERRITIN, TIBC, IRON, RETICCTPCT in the last 72 hours. Urine analysis:    Component Value Date/Time   COLORURINE ORANGE (A) 04/19/2016 1935   APPEARANCEUR CLEAR 04/19/2016 1935   LABSPEC 1.020 04/19/2016 1935   PHURINE 6.0 04/19/2016 1935   GLUCOSEU NEGATIVE 04/19/2016 [place] NEGATIVE 04/19/2016 1935   BILIRUBINUR SMALL (A) 04/19/2016 [place] NEGATIVE 04/19/2016 1935   PROTEINUR 30 (A) 04/19/2016 1935   UROBILINOGEN 0.2 10/31/2014 2254   NITRITE NEGATIVE 04/19/2016 1935   LEUKOCYTESUR SMALL (A) 04/19/2016 1935   Sepsis Labs: @LABRCNTIP (procalcitonin:4,lacticidven:4)    Recent Results (from the past 240 hour(s))  Culture, blood (Routine X 2) w Reflex to ID Panel     Status: Abnormal   Collection Time: 04/19/16  7:20 PM  Result Value Ref Range Status   Specimen Description BLOOD RIGHT ANTECUBITAL  Final   Special Requests BOTTLES DRAWN AEROBIC AND ANAEROBIC 5CC  Final   Culture  Setup Time   Final    GRAM NEGATIVE RODS IN BOTH AEROBIC AND ANAEROBIC  BOTTLES Organism ID to follow CRITICAL RESULT CALLED TO, READ BACK BY AND VERIFIED WITH: Hughie Closs Pharm.D. 12:20 04/20/16 ([email or web address])    Culture ESCHERICHIA COLI (A)  Final   Report Status 04/22/2016 FINAL  Final   Organism ID, Bacteria ESCHERICHIA COLI  Final      Susceptibility   Escherichia coli - MIC*    AMPICILLIN 8 SENSITIVE Sensitive     CEFAZOLIN <=4 SENSITIVE Sensitive     CEFEPIME <=1 SENSITIVE Sensitive     CEFTAZIDIME <=1 SENSITIVE Sensitive     CEFTRIAXONE <=1 SENSITIVE Sensitive     CIPROFLOXACIN <=0.25 SENSITIVE Sensitive     GENTAMICIN <=1 SENSITIVE Sensitive     IMIPENEM <=0.25 SENSITIVE Sensitive     TRIMETH/SULFA <=20 SENSITIVE Sensitive     AMPICILLIN/SULBACTAM <=2 SENSITIVE Sensitive     PIP/TAZO <=4 SENSITIVE Sensitive     Extended ESBL NEGATIVE Sensitive     * ESCHERICHIA COLI  Blood Culture ID Panel (Reflexed)     Status: Abnormal   Collection Time: 04/19/16  7:20 PM  Result Value Ref Range Status   Enterococcus species NOT DETECTED NOT DETECTED Final   Listeria monocytogenes NOT DETECTED NOT DETECTED Final   Staphylococcus species NOT DETECTED NOT DETECTED Final   Staphylococcus aureus NOT DETECTED NOT DETECTED Final   Streptococcus species NOT DETECTED NOT DETECTED Final   Streptococcus agalactiae NOT DETECTED NOT DETECTED Final   Streptococcus pneumoniae NOT DETECTED NOT DETECTED Final   Streptococcus pyogenes NOT DETECTED NOT DETECTED Final   Acinetobacter baumannii NOT DETECTED NOT DETECTED Final   Enterobacteriaceae species DETECTED (A) NOT DETECTED Final    Comment: CRITICAL RESULT CALLED TO, READ BACK BY AND VERIFIED WITH: Hughie Closs Pharm.D. 12:20 04/20/16 ([email or web address])    Enterobacter cloacae complex NOT DETECTED NOT DETECTED Final   Escherichia coli DETECTED (A) NOT DETECTED Final    Comment: CRITICAL RESULT CALLED TO, READ BACK BY AND VERIFIED WITH: Hughie Closs Pharm.D. 12:20 04/20/16 ([email or web address])    Klebsiella oxytoca NOT DETECTED NOT DETECTED  Final   Klebsiella pneumoniae NOT  DETECTED NOT DETECTED Final   Proteus species NOT DETECTED NOT DETECTED Final   Serratia marcescens NOT DETECTED NOT DETECTED Final   Carbapenem resistance NOT DETECTED NOT DETECTED Final   Haemophilus influenzae NOT DETECTED NOT DETECTED Final   Neisseria meningitidis NOT DETECTED NOT DETECTED Final   Pseudomonas aeruginosa NOT DETECTED NOT DETECTED Final   Candida albicans NOT DETECTED NOT DETECTED Final   Candida glabrata NOT DETECTED NOT DETECTED Final   Candida krusei NOT DETECTED NOT DETECTED Final   Candida parapsilosis NOT DETECTED NOT DETECTED Final   Candida tropicalis NOT DETECTED NOT DETECTED Final  Culture, blood (Routine X 2) w Reflex to ID Panel     Status: None (Preliminary result)   Collection Time: 04/19/16  7:22 PM  Result Value Ref Range Status   Specimen Description BLOOD LEFT ANTECUBITAL  Final   Special Requests BOTTLES DRAWN AEROBIC AND ANAEROBIC 5CC  Final   Culture NO GROWTH 2 DAYS  Final   Report Status PENDING  Incomplete  Urine culture     Status: Abnormal   Collection Time: 04/19/16  7:35 PM  Result Value Ref Range Status   Specimen Description URINE, RANDOM  Final   Special Requests NONE  Final   Culture 30,000 COLONIES/mL KLEBSIELLA OXYTOCA (A)  Final   Report Status 04/22/2016 FINAL  Final   Organism ID, Bacteria KLEBSIELLA OXYTOCA (A)  Final      Susceptibility   Klebsiella oxytoca - MIC*    AMPICILLIN >=32 RESISTANT Resistant     CEFAZOLIN 8 SENSITIVE Sensitive     CEFTRIAXONE <=1 SENSITIVE Sensitive     CIPROFLOXACIN <=0.25 SENSITIVE Sensitive     GENTAMICIN <=1 SENSITIVE Sensitive     IMIPENEM <=0.25 SENSITIVE Sensitive     NITROFURANTOIN <=16 SENSITIVE Sensitive     TRIMETH/SULFA <=20 SENSITIVE Sensitive     AMPICILLIN/SULBACTAM 8 SENSITIVE Sensitive     PIP/TAZO <=4 SENSITIVE Sensitive     Extended ESBL NEGATIVE Sensitive     * 30,000 COLONIES/mL KLEBSIELLA OXYTOCA      Radiology Studies:  US  Abdomen Complete Result Date: 04/22/2016 1. Gallbladder distention. Intraluminal gallbladder septations versus cysts. Both conditions are rare, however may be congenital or acquired. This can be a sequela of prior acute cholecystitis. No solid component or vascularity. No gallstones are visualized. No findings of acute cholecystitis. 2. Normal sonographic appearance of the liver. No biliary dilatation. 3. Mild splenomegaly. 4. Bilateral renal cysts. Electronically Signed   By: Jeb Levering M.D.   On: 04/22/2016 01:33    Dg Chest Port 1 View Result Date: 04/21/2016 Congestive heart failure with overall increase in pulmonary edema. No airspace consolidation. There is aortic atherosclerosis.   Ct Angio Chest Pe W Or Wo Contrast Result Date: 04/20/2016 1. No pulmonary embolus. 2. Suspect minimal pulmonary edema. 3. Moderate-sized hiatal hernia. 4. Distended gallbladder is partially included in the upper abdomen. If there are any right upper quadrant symptoms or clinical concern for biliary pathology, recommend ultrasound evaluation. Electronically Signed   By: Jeb Levering M.D.   On: 04/20/2016 05:37   Dg Chest Port 1 View Result Date: 04/19/2016 1. Low lung volumes. 2. Mild cardiomegaly with central vascular congestion and findings suggestive of mild perihilar edema. 3. No acute consolidation 4. Atherosclerotic vascular calcification of the aorta     Scheduled Meds: . aspirin  325 mg Oral BID  . clopidogrel  75 mg Oral Daily  . furosemide  40 mg Intravenous Daily  . isosorbide mononitrate  15 mg Oral q morning - 10a  . lisinopril  10 mg Oral Daily  . metoprolol tartrate  37.5 mg Oral BID  . pantoprazole  40 mg Oral Daily  . piperacillin-tazobactam (ZOSYN)  IV  3.375 g Intravenous Q8H  . sodium chloride flush  3 mL Intravenous Q12H   Continuous Infusions:     LOS: 3 days    Time spent: 25 minutes  Greater than 50% of the time spent on counseling and coordinating the  care.   Leisa Lenz, MD [facility] Pager [phone]  If 7PM-7AM, please contact night-coverage [email or web address] Password [email or web address] 04/22/2016, 9:46 AM

## 2016-04-22 NOTE — Progress Notes (Signed)
Report called to Alethia Berthold, receiving RN on  6E. VSS. Transferred to [phone] via wheelchair with personal belongings. Patients family at bedside.  [facility]

## 2016-04-22 NOTE — Care Management Important Message (Signed)
Important Message  Patient Details  Name: [name] MRN: [id number]:[id number] Date of Birth: March 07, 1935   Medicare Important Message Given:  Yes    [name] Abena 04/22/2016, 10:15 AM

## 2016-04-23 LAB — COMPREHENSIVE METABOLIC PANEL
ALBUMIN: 2.7 g/dL — AB (ref 3.5–5.0)
ALK PHOS: 180 U/L — AB (ref 38–126)
ALT: 57 U/L (ref 17–63)
ANION GAP: 7 (ref 5–15)
AST: 31 U/L (ref 15–41)
BILIRUBIN TOTAL: 1.8 mg/dL — AB (ref 0.3–1.2)
BUN: 14 mg/dL (ref 6–20)
CALCIUM: 8.4 mg/dL — AB (ref 8.9–10.3)
CO2: 27 mmol/L (ref 22–32)
CREATININE: 0.82 mg/dL (ref 0.61–1.24)
Chloride: 102 mmol/L (ref 101–111)
GFR calc non Af Amer: >60 mL/min (ref >60)
GLUCOSE: 152 mg/dL — AB (ref 65–99)
Potassium: 3.4 mmol/L — ABNORMAL LOW (ref 3.5–5.1)
Sodium: 136 mmol/L (ref 135–145)
TOTAL PROTEIN: 6.1 g/dL — AB (ref 6.5–8.1)

## 2016-04-23 LAB — CBC
HEMATOCRIT: 30.1 % — AB (ref 39.0–52.0)
Hemoglobin: 9.4 g/dL — ABNORMAL LOW (ref 13.0–17.0)
MCH: 24.4 pg — ABNORMAL LOW (ref 26.0–34.0)
MCHC: 31.2 g/dL (ref 30.0–36.0)
MCV: 78.2 fL (ref 78.0–100.0)
PLATELETS: 385 10*3/uL (ref 150–400)
RBC: 3.85 MIL/uL — ABNORMAL LOW (ref 4.22–5.81)
RDW: 17.1 % — AB (ref 11.5–15.5)
WBC: 8.4 10*3/uL (ref 4.0–10.5)

## 2016-04-23 LAB — GLUCOSE, CAPILLARY
Glucose-Capillary: 141 mg/dL — ABNORMAL HIGH (ref 65–99)
Glucose-Capillary: 134 mg/dL — ABNORMAL HIGH (ref 65–99)
Glucose-Capillary: 153 mg/dL — ABNORMAL HIGH (ref 65–99)
Glucose-Capillary: 160 mg/dL — ABNORMAL HIGH (ref 65–99)

## 2016-04-23 LAB — MAGNESIUM: Magnesium: 2.1 mg/dL (ref 1.7–2.4)

## 2016-04-23 MED ORDER — POTASSIUM CHLORIDE CRYS ER 20 MEQ PO TBCR
40.0000 meq | EXTENDED_RELEASE_TABLET | Freq: Once | ORAL | Status: AC
  Administered 2016-04-23: 40 meq via ORAL
  Filled 2016-04-23: qty 2

## 2016-04-23 NOTE — Progress Notes (Signed)
[name], male   DOB: [birth year], 81 y.o.   MRN: [place]:[id number]  PROGRESS NOTE    GERMAIN VANKOOTEN  [id number] DOB: 1934-08-10 DOA: 04/19/2016  PCP: Eliezer Lofts, MD   Brief Narrative:  81 y.o. male with past medical history significant of CAD, hyperlipidemia, hypertension, DJD, GERD, hiatal hernia, recent left total knee replacement. He presented to [facility] due to abdominal discomfort for which he took antiacids and then Rolaids without any significant relief. He reported pain radiating to chest. EMS arrived to his home and patient's BP was in 80's but has improved to 100/50 with bolus of NS. Pt was also given aspirin 325 mg and his chest pain improved.  In ED, T max was 103.4 F, HR 51-134, RR 14-31, oxygen saturation 86% on room air but has improved to 100% with [place] oxygen support. Blood work was notable for for WBC count 10.6, hemoglobin 9.3, lactic acid 4.0. CXR showed low lung volumes, no acute consolidation. CT angio chest ruled out PE but there was suspected minimal pulmonary edema and distended gallbladder. UA showed some leukocytes and WBC. Pt was started on vanco and zosyn for sepsis.  Patient was subsequently found to have Escherichia coli bacteremia. We also did workup for elevated LFTs but ultrasound did not show acute cholecystitis or gallstones.  Assessment & Plan:   Sepsis due to E.Coli ([place]) / Leukocytosis / E.Coli bacteremia / Klebsiella UTI - Sepsis crietria emt on admission with fever, tachycardia, tachynea, hypoxia, leukocytosi and lactic acidosis - Source of infection E.Coli bacteremia, likely form possible UTI - Urine culture is growing Klebsiella species - Repeat blood cultures 04/21/2016 show no growth  - Sttopped vanco 10/11, stopped zosyn 10/13 and started rocephin 10/13   Active Problems: Acute encephalopathy - Mental status at baseline, oriented to time, place and person  Acute respiratory failure with hypoxia - More short of breath at 04/21/2016 so  order placed for chest x-ray which showed congestive heart failure with increase in pulmonary edema but no airspace consolidation - Started IV Lasix 40 mg daily 04/21/2016.Resp status better - Continue lasix through today  - 2 D ECHO in 10/2015 showed EF 60%, grade 2 DD - CT angio chest ruled out PE  Dyslipidemia associated with type 2 DM - Holding Crestor due to elevated LFT's   Elevated LFT's - LFTs improving - Abdominal ultrasound did not show acute cholecystitis or gallstones. There is gallbladder distention however and septations versus cysts, per radiology report both conditions are rare but may be congenital or acquired or sequela of prior acute cholecystitis.  Essential hypertension  - Continue metoprolol 37.5 mg po BID. - Continue lisinopril 10 mg po daily - Continue imdur 15 mg daily   Coronary atherosclerosis - Continue aspirin, clopidogrel   Normocytic anemia - Hemoglobin overall stable  Hypokalemia - Secondary to Lasix - Supplemented - Magnesium WNL  GERD - Continue Pantoprazole 40 mg po daily.  S/P total knee replacement - Stable from ortho standpoint and less likely source of infection - Continue pain management efforts    DVT prophylaxis: Lovenox subQ Code Status: full code  Family Communication: spoke with pt son over the phone 10/12; spoke with pt wife 10/13 over the phone  Disposition Plan: home in am   Consultants:   None   Procedures:   None   Antimicrobials:   Vanco 04/19/2016 --> 04/20/2016   Zosyn 04/19/2016 --> 10/13  Rocephin 10/13 -->   Subjective: Feels better.  Objective: Vitals:  04/22/16 1721 04/22/16 2037 04/23/16 0542 04/23/16 0850  BP: (!) 161/95 (!) 160/82 (!) 158/74 (!) 173/86  Pulse: (!) 58 82 96 94  Resp: 19 20 18 18   Temp: 99.1 F (37.3 C) 98.7 F (37.1 C) 99.6 F (37.6 C) 98.4 F (36.9 C)  TempSrc: Oral Oral Oral Oral  SpO2: 95% 96% 97% 98%  Weight:  88.2 kg (194 lb 7.1 oz)    Height:         Intake/Output Summary (Last 24 hours) at 04/23/16 1238 Last data filed at 04/23/16 0940  Gross per 24 hour  Intake              810 ml  Output             2700 ml  Net            -1890 ml   Filed Weights   04/19/16 1931 04/19/16 2330 04/22/16 2037  Weight: 86.2 kg (190 lb) 88 kg (194 lb 0.1 oz) 88.2 kg (194 lb 7.1 oz)    Examination:  General exam: No distress, calm  Respiratory system: No wheezing, no rhonchi, bilateral air entry  Cardiovascular system: S1 & S2 heard, RRR Gastrointestinal system: (+) BS, no distention  Central nervous system: No focal deficits  Extremities: No swelling, pulses palpable  Skin: warm, dry Psychiatry: Normal mood and behavior, no agitation  Data Reviewed: I have personally reviewed following labs and imaging studies  CBC:  Recent Labs Lab 04/19/16 1850 04/19/16 1856 04/20/16 0252 04/21/16 0522 04/22/16 0337 04/23/16 1018  WBC 10.6*  --  14.7* 12.9* 9.7 8.4  NEUTROABS 9.7*  --  13.1* 10.7*  --   --   HGB 9.3* 10.2* 8.6* 8.7* 8.5* 9.4*  HCT 29.3* 30.0* 26.9* 27.2* 26.8* 30.1*  MCV 79.2  --  79.1 79.3 78.4 78.2  PLT 386  --  353 379 372 0000000   Basic Metabolic Panel:  Recent Labs Lab 04/19/16 1850 04/19/16 1856 04/20/16 0252 04/21/16 0522 04/22/16 0337 04/23/16 0615  NA 134* 134* 136 137 138 136  K 4.2 4.2 3.9 3.5 3.0* 3.4*  CL 102 100* 107 109 104 102  CO2 22  --  21* 20* 26 27  GLUCOSE 170* 166* 162* 118* 139* 152*  BUN 18 23* 17 14 12 14   CREATININE 1.10 1.00 1.04 0.81 0.85 0.82  CALCIUM 8.4*  --  7.9* 8.1* 8.2* 8.4*  MG 2.0  --   --   --   --  2.1  PHOS 3.5  --   --   --   --   --    GFR: Estimated Creatinine Clearance: 77.4 mL/min (by C-G formula based on SCr of 0.82 mg/dL). Liver Function Tests:  Recent Labs Lab 04/19/16 1850 04/20/16 0252 04/21/16 0522 04/22/16 0337 04/23/16 0615  AST 165* 200* 85* 49* 31  ALT 70* 128* 94* 72* 57  ALKPHOS 168* 201* 159* 163* 180*  BILITOT 2.5* 4.2* 4.7* 2.6* 1.8*   PROT 6.5 5.2* 5.8* 5.8* 6.1*  ALBUMIN 3.2* 2.7* 2.8* 2.7* 2.7*   No results for input(s): LIPASE, AMYLASE in the last 168 hours. No results for input(s): AMMONIA in the last 168 hours. Coagulation Profile: No results for input(s): INR, PROTIME in the last 168 hours. Cardiac Enzymes: No results for input(s): CKTOTAL, CKMB, CKMBINDEX, TROPONINI in the last 168 hours. BNP (last 3 results) No results for input(s): PROBNP in the last 8760 hours. HbA1C: No results for input(s): HGBA1C in the  last 72 hours. CBG:  Recent Labs Lab 04/21/16 1635 04/22/16 0748 04/22/16 1719 04/23/16 0757 04/23/16 1140  GLUCAP 116* 173* 103* 141* 134*   Lipid Profile: No results for input(s): CHOL, HDL, LDLCALC, TRIG, CHOLHDL, LDLDIRECT in the last 72 hours. Thyroid Function Tests: No results for input(s): TSH, T4TOTAL, FREET4, T3FREE, THYROIDAB in the last 72 hours. Anemia Panel: No results for input(s): VITAMINB12, FOLATE, FERRITIN, TIBC, IRON, RETICCTPCT in the last 72 hours. Urine analysis:    Component Value Date/Time   COLORURINE ORANGE (A) 04/19/2016 1935   APPEARANCEUR CLEAR 04/19/2016 1935   LABSPEC 1.020 04/19/2016 1935   PHURINE 6.0 04/19/2016 1935   GLUCOSEU NEGATIVE 04/19/2016 [place] NEGATIVE 04/19/2016 1935   BILIRUBINUR SMALL (A) 04/19/2016 [place] NEGATIVE 04/19/2016 1935   PROTEINUR 30 (A) 04/19/2016 1935   UROBILINOGEN 0.2 10/31/2014 2254   NITRITE NEGATIVE 04/19/2016 1935   LEUKOCYTESUR SMALL (A) 04/19/2016 1935   Sepsis Labs: @LABRCNTIP (procalcitonin:4,lacticidven:4)    Recent Results (from the past 240 hour(s))  Culture, blood (Routine X 2) w Reflex to ID Panel     Status: Abnormal   Collection Time: 04/19/16  7:20 PM  Result Value Ref Range Status   Specimen Description BLOOD RIGHT ANTECUBITAL  Final   Special Requests BOTTLES DRAWN AEROBIC AND ANAEROBIC 5CC  Final   Culture  Setup Time   Final    GRAM NEGATIVE RODS IN BOTH AEROBIC AND ANAEROBIC  BOTTLES Organism ID to follow CRITICAL RESULT CALLED TO, READ BACK BY AND VERIFIED WITH: Hughie Closs Pharm.D. 12:20 04/20/16 ([email or web address])    Culture ESCHERICHIA COLI (A)  Final   Report Status 04/22/2016 FINAL  Final   Organism ID, Bacteria ESCHERICHIA COLI  Final      Susceptibility   Escherichia coli - MIC*    AMPICILLIN 8 SENSITIVE Sensitive     CEFAZOLIN <=4 SENSITIVE Sensitive     CEFEPIME <=1 SENSITIVE Sensitive     CEFTAZIDIME <=1 SENSITIVE Sensitive     CEFTRIAXONE <=1 SENSITIVE Sensitive     CIPROFLOXACIN <=0.25 SENSITIVE Sensitive     GENTAMICIN <=1 SENSITIVE Sensitive     IMIPENEM <=0.25 SENSITIVE Sensitive     TRIMETH/SULFA <=20 SENSITIVE Sensitive     AMPICILLIN/SULBACTAM <=2 SENSITIVE Sensitive     PIP/TAZO <=4 SENSITIVE Sensitive     Extended ESBL NEGATIVE Sensitive     * ESCHERICHIA COLI  Blood Culture ID Panel (Reflexed)     Status: Abnormal   Collection Time: 04/19/16  7:20 PM  Result Value Ref Range Status   Enterococcus species NOT DETECTED NOT DETECTED Final   Listeria monocytogenes NOT DETECTED NOT DETECTED Final   Staphylococcus species NOT DETECTED NOT DETECTED Final   Staphylococcus aureus NOT DETECTED NOT DETECTED Final   Streptococcus species NOT DETECTED NOT DETECTED Final   Streptococcus agalactiae NOT DETECTED NOT DETECTED Final   Streptococcus pneumoniae NOT DETECTED NOT DETECTED Final   Streptococcus pyogenes NOT DETECTED NOT DETECTED Final   Acinetobacter baumannii NOT DETECTED NOT DETECTED Final   Enterobacteriaceae species DETECTED (A) NOT DETECTED Final    Comment: CRITICAL RESULT CALLED TO, READ BACK BY AND VERIFIED WITH: Hughie Closs Pharm.D. 12:20 04/20/16 ([email or web address])    Enterobacter cloacae complex NOT DETECTED NOT DETECTED Final   Escherichia coli DETECTED (A) NOT DETECTED Final    Comment: CRITICAL RESULT CALLED TO, READ BACK BY AND VERIFIED WITH: Hughie Closs Pharm.D. 12:20 04/20/16 ([email or web address])    Klebsiella oxytoca NOT DETECTED NOT DETECTED  Final  Klebsiella pneumoniae NOT DETECTED NOT DETECTED Final   Proteus species NOT DETECTED NOT DETECTED Final   Serratia marcescens NOT DETECTED NOT DETECTED Final   Carbapenem resistance NOT DETECTED NOT DETECTED Final   Haemophilus influenzae NOT DETECTED NOT DETECTED Final   Neisseria meningitidis NOT DETECTED NOT DETECTED Final   Pseudomonas aeruginosa NOT DETECTED NOT DETECTED Final   Candida albicans NOT DETECTED NOT DETECTED Final   Candida glabrata NOT DETECTED NOT DETECTED Final   Candida krusei NOT DETECTED NOT DETECTED Final   Candida parapsilosis NOT DETECTED NOT DETECTED Final   Candida tropicalis NOT DETECTED NOT DETECTED Final  Culture, blood (Routine X 2) w Reflex to ID Panel     Status: None (Preliminary result)   Collection Time: 04/19/16  7:22 PM  Result Value Ref Range Status   Specimen Description BLOOD LEFT ANTECUBITAL  Final   Special Requests BOTTLES DRAWN AEROBIC AND ANAEROBIC 5CC  Final   Culture NO GROWTH 4 DAYS  Final   Report Status PENDING  Incomplete  Urine culture     Status: Abnormal   Collection Time: 04/19/16  7:35 PM  Result Value Ref Range Status   Specimen Description URINE, RANDOM  Final   Special Requests NONE  Final   Culture 30,000 COLONIES/mL KLEBSIELLA OXYTOCA (A)  Final   Report Status 04/22/2016 FINAL  Final   Organism ID, Bacteria KLEBSIELLA OXYTOCA (A)  Final      Susceptibility   Klebsiella oxytoca - MIC*    AMPICILLIN >=32 RESISTANT Resistant     CEFAZOLIN 8 SENSITIVE Sensitive     CEFTRIAXONE <=1 SENSITIVE Sensitive     CIPROFLOXACIN <=0.25 SENSITIVE Sensitive     GENTAMICIN <=1 SENSITIVE Sensitive     IMIPENEM <=0.25 SENSITIVE Sensitive     NITROFURANTOIN <=16 SENSITIVE Sensitive     TRIMETH/SULFA <=20 SENSITIVE Sensitive     AMPICILLIN/SULBACTAM 8 SENSITIVE Sensitive     PIP/TAZO <=4 SENSITIVE Sensitive     Extended ESBL NEGATIVE Sensitive     * 30,000 COLONIES/mL KLEBSIELLA OXYTOCA  Culture, blood (routine x 2)      Status: None (Preliminary result)   Collection Time: 04/21/16 11:17 AM  Result Value Ref Range Status   Specimen Description BLOOD LEFT ARM  Final   Special Requests IN PEDIATRIC BOTTLE 3CC  Final   Culture NO GROWTH 2 DAYS  Final   Report Status PENDING  Incomplete  Culture, blood (routine x 2)     Status: None (Preliminary result)   Collection Time: 04/21/16 11:21 AM  Result Value Ref Range Status   Specimen Description BLOOD RIGHT HAND  Final   Special Requests IN PEDIATRIC BOTTLE 3CC  Final   Culture NO GROWTH 2 DAYS  Final   Report Status PENDING  Incomplete      Radiology Studies:  US Abdomen Complete Result Date: 04/22/2016 1. Gallbladder distention. Intraluminal gallbladder septations versus cysts. Both conditions are rare, however may be congenital or acquired. This can be a sequela of prior acute cholecystitis. No solid component or vascularity. No gallstones are visualized. No findings of acute cholecystitis. 2. Normal sonographic appearance of the liver. No biliary dilatation. 3. Mild splenomegaly. 4. Bilateral renal cysts. Electronically Signed   By: Jeb Levering M.D.   On: 04/22/2016 01:33    Dg Chest Port 1 View Result Date: 04/21/2016 Congestive heart failure with overall increase in pulmonary edema. No airspace consolidation. There is aortic atherosclerosis.   Ct Angio Chest Pe W Or Wo Contrast Result Date:  04/20/2016 1. No pulmonary embolus. 2. Suspect minimal pulmonary edema. 3. Moderate-sized hiatal hernia. 4. Distended gallbladder is partially included in the upper abdomen. If there are any right upper quadrant symptoms or clinical concern for biliary pathology, recommend ultrasound evaluation. Electronically Signed   By: Jeb Levering M.D.   On: 04/20/2016 05:37   Dg Chest Port 1 View Result Date: 04/19/2016 1. Low lung volumes. 2. Mild cardiomegaly with central vascular congestion and findings suggestive of mild perihilar edema. 3. No acute  consolidation 4. Atherosclerotic vascular calcification of the aorta     Scheduled Meds: . aspirin  325 mg Oral BID  . cefTRIAXone (ROCEPHIN)  IV  2 g Intravenous Q24H  . clopidogrel  75 mg Oral Daily  . furosemide  40 mg Intravenous Daily  . isosorbide mononitrate  15 mg Oral q morning - 10a  . lisinopril  10 mg Oral Daily  . metoprolol tartrate  37.5 mg Oral BID  . pantoprazole  40 mg Oral Daily  . sodium chloride flush  3 mL Intravenous Q12H   Continuous Infusions:     LOS: 4 days    Time spent: 25 minutes  Greater than 50% of the time spent on counseling and coordinating the care.   Leisa Lenz, MD [facility] Pager [phone]  If 7PM-7AM, please contact night-coverage [email or web address] Password [facility] 04/23/2016, 12:38 PM

## 2016-04-24 LAB — BASIC METABOLIC PANEL
ANION GAP: 9 (ref 5–15)
BUN: 12 mg/dL (ref 6–20)
CALCIUM: 8.8 mg/dL — AB (ref 8.9–10.3)
CO2: 23 mmol/L (ref 22–32)
Chloride: 104 mmol/L (ref 101–111)
Creatinine, Ser: 0.74 mg/dL (ref 0.61–1.24)
Glucose, Bld: 136 mg/dL — ABNORMAL HIGH (ref 65–99)
Potassium: 3.5 mmol/L (ref 3.5–5.1)
Sodium: 136 mmol/L (ref 135–145)

## 2016-04-24 LAB — CULTURE, BLOOD (ROUTINE X 2): Culture: NO GROWTH

## 2016-04-24 LAB — GLUCOSE, CAPILLARY: Glucose-Capillary: 136 mg/dL — ABNORMAL HIGH (ref 65–99)

## 2016-04-24 MED ORDER — CIPROFLOXACIN HCL 500 MG PO TABS: 500.0000 mg | ORAL_TABLET | Freq: Two times a day (BID) | ORAL | 0 refills | Status: DC

## 2016-04-24 NOTE — Discharge Summary (Signed)
Physician Discharge Summary  [name] [id number] DOB: 11/15/1934 DOA: 04/19/2016  PCP: Eliezer Lofts, MD  Admit date: 04/19/2016 Discharge date: 04/24/2016  Recommendations for Outpatient Follow-up:  Continue cipro for 9 days on discharge as prescribed  Discharge Diagnoses:  Principal Problem:   Sepsis due to Escherichia coli (E. coli) ([place]) Active Problems:   E coli bacteremia   Acute encephalopathy   PERCUTANEOUS TRANSLUMINAL CORONARY ANGIOPLASTY, HX OF   S/P CABG x 4   Coronary artery disease involving native coronary artery of native heart without angina pectoris   S/P total knee replacement   Leukocytosis   Normocytic anemia   Benign essential HTN   Abnormal LFTs    Discharge Condition: stable   Diet recommendation: as tolerated   History of present illness:  81 y.o.malewith past medical history significant of CAD, hyperlipidemia, hypertension, DJD, GERD, hiatal hernia, recent left total knee replacement. He presented to [facility] due to abdominal discomfort for which he took antiacids and then Rolaids without any significant relief. He reported pain radiating to chest. EMS arrived to his home and patient's BP was in 80's but has improved to 100/50 with bolus of NS. Pt was also given aspirin 325 mg and his chest pain improved.  In ED, T max was 103.4 F, HR 51-134, RR 14-31, oxygen saturation 86% on room air but has improved to 100% with [place] oxygen support. Blood work was notable for for WBC count 10.6, hemoglobin 9.3, lactic acid 4.0. CXR showed low lung volumes, no acute consolidation. CT angio chest ruled out PE but there was suspected minimal pulmonary edema and distended gallbladder. UA showed some leukocytes and WBC. Pt was started on vanco and zosyn for sepsis.  Patient was subsequently found to have Escherichia coli bacteremia. We also did workup for elevated LFTs but ultrasound did not show acute cholecystitis or gallstones.  Hospital Course:    Assessment &  Plan:   Sepsis due to E.Coli ([place]) / Leukocytosis / E.Coli bacteremia / Klebsiella UTI - Sepsis crietria emt on admission with fever, tachycardia, tachynea, hypoxia, leukocytosi and lactic acidosis - Source of infection E.Coli bacteremia, likely form possible UTI - Urine culture is growing Klebsiella species - Repeat blood cultures 04/21/2016 show no growth  - Sttopped vanco 10/11, stopped zosyn 10/13 and started rocephin 10/13   Active Problems: Acute metabolic encephalopathy - Altered mental status on the admission likely in the setting of sepsis - Mental status at baseline, oriented to time, place and person  Acute respiratory failure with hypoxia - More short of breath at 04/21/2016 so order placed for chest x-ray which showed congestive heart failure with increase in pulmonary edema but no airspace consolidation - Started IV Lasix 40 mg daily 04/21/2016.Resp status better - Continue lasix through today  - 2 D ECHO in 10/2015 showed EF 60%, grade 2 DD - CT angio chest ruled out PE  Dyslipidemia associated with type 2 DM - Holding Crestor due to elevated LFT's   Elevated LFT's - LFTs improving - Abdominal ultrasound did not show acute cholecystitis or gallstones. There is gallbladder distention however and septations versus cysts, per radiology report both conditions are rare but may be congenital or acquired or sequela of prior acute cholecystitis.  Essential hypertension  - Continue metoprolol 37.5 mg po BID. - Continue lisinopril 10 mg po daily - Continue imdur 15 mg daily   Coronary atherosclerosis - Continue aspirin, clopidogrel   Normocytic anemia - Hemoglobin overall stable  Hypokalemia - Secondary to Lasix -  Supplemented - Magnesium WNL  GERD - Continue Pantoprazole 40 mg po daily.  S/P total knee replacement - Stable from ortho standpoint and less likely source of infection - Continue pain management efforts   Chronic diastolic CHF -  Compensated  - 2 D ECHO in 10/2015 with grade 2 DD and preserved EF    DVT prophylaxis: Lovenox subQ Code Status: full code  Family Communication: spoke with pt son over the phone 10/12; spoke with pt wife 10/13 over the phone  Disposition Plan: home in am   Consultants:   None   Procedures:   None   Antimicrobials:   Vanco 04/19/2016 --> 04/20/2016   Zosyn 04/19/2016 --> 10/13  Rocephin 10/13 --> 04/24/2016   Signed:  Leisa Lenz, MD  [facility] 04/24/2016, 8:31 AM  Pager #: [phone]  Time spent in minutes: more than 30 minutes   Discharge Exam: Vitals:   04/23/16 2109 04/24/16 0432  BP: (!) 151/82 (!) 168/80  Pulse: (!) 108 82  Resp: 16 18  Temp: 98.7 F (37.1 C) 97.9 F (36.6 C)   Vitals:   04/23/16 0850 04/23/16 1700 04/23/16 2109 04/24/16 0432  BP: (!) 173/86 131/63 (!) 151/82 (!) 168/80  Pulse: 94 89 (!) 108 82  Resp: 18 18 16 18   Temp: 98.4 F (36.9 C) 98.1 F (36.7 C) 98.7 F (37.1 C) 97.9 F (36.6 C)  TempSrc: Oral Oral Oral Oral  SpO2: 98% 99% 95% 97%  Weight:      Height:        General: Pt is alert, follows commands appropriately, not in acute distress Cardiovascular: Regular rate and rhythm, S1/S2 +, no murmurs Respiratory: Clear to auscultation bilaterally, no wheezing, no crackles, no rhonchi Abdominal: Soft, non tender, non distended, bowel sounds +, no guarding Extremities: no edema, no cyanosis, pulses palpable bilaterally DP and PT Neuro: Grossly nonfocal  Discharge Instructions  Discharge Instructions    Call MD for:  persistant nausea and vomiting    Complete by:  As directed    Call MD for:  severe uncontrolled pain    Complete by:  As directed    Call MD for:  temperature >100.4    Complete by:  As directed    Diet - low sodium heart healthy    Complete by:  As directed    Discharge instructions    Complete by:  As directed    Continue cipro for 9 days on discharge as prescribed   Increase  activity slowly    Complete by:  As directed        Medication List    TAKE these medications   acetaminophen 500 MG tablet Commonly known as:  TYLENOL Take 500-1,000 mg by mouth 2 (two) times daily as needed (for pain).   aspirin 325 MG EC tablet Take 1 tablet (325 mg total) by mouth 2 (two) times daily.   ciprofloxacin 500 MG tablet Commonly known as:  CIPRO Take 1 tablet (500 mg total) by mouth 2 (two) times daily.   clopidogrel 75 MG tablet Commonly known as:  PLAVIX TAKE ONE TABLET BY MOUTH ONCE DAILY What changed:  See the new instructions.   isosorbide mononitrate 30 MG 24 hr tablet Commonly known as:  IMDUR Take 1 tablet (30 mg total) by mouth daily. What changed:  how much to take  when to take this   lisinopril 10 MG tablet Commonly known as:  PRINIVIL,ZESTRIL Take 1 tablet (10 mg total) by mouth daily.  methocarbamol 500 MG tablet Commonly known as:  ROBAXIN Take 1-2 tablets (500-1,000 mg total) by mouth every 6 (six) hours as needed for muscle spasms. What changed:  how much to take   metoprolol tartrate 25 MG tablet Commonly known as:  LOPRESSOR TAKE ONE AND ONE-HALF TABLETS BY MOUTH TWICE A DAY What changed:  See the new instructions.   nitroGLYCERIN 0.4 MG SL tablet Commonly known as:  NITROSTAT Place 0.4 mg under the tongue every 5 (five) minutes as needed for chest pain.   ondansetron 4 MG tablet Commonly known as:  ZOFRAN Take 1 tablet (4 mg total) by mouth every 6 (six) hours as needed for nausea.   oxyCODONE 5 MG immediate release tablet Commonly known as:  Oxy IR/ROXICODONE Take 1-2 tablets (5-10 mg total) by mouth every 3 (three) hours as needed for breakthrough pain.   pantoprazole 40 MG tablet Commonly known as:  PROTONIX Take 1 tablet (40 mg total) by mouth daily.   rosuvastatin 5 MG tablet Commonly known as:  CRESTOR Take 1 tablet (5 mg total) by mouth daily.      Follow-up Information    Eliezer Lofts, MD. Schedule an  appointment as soon as possible for a visit in 1 week(s).   Specialty:  Family Medicine Contact information: [place] [address] [phone]            The results of significant diagnostics from this hospitalization (including imaging, microbiology, ancillary and laboratory) are listed below for reference.    Significant Diagnostic Studies: Dg Chest 2 View  Result Date: 03/31/2016 CLINICAL DATA:  Fell knee arthroplasty.  Preop testing. EXAM: CHEST  2 VIEW COMPARISON:  12/17/2014 FINDINGS: Stable normal heart size. Status post CABG and coronary stenting. Moderate hiatal hernia. There is no edema, consolidation, effusion, or pneumothorax. Remote and healed lateral left clavicle fracture. IMPRESSION: No active disease. Hiatal hernia. Coronary atherosclerosis. Electronically Signed   By: Monte Fantasia M.D.   On: 03/31/2016 16:06   Ct Angio Chest Pe W Or Wo Contrast  Result Date: 04/20/2016 CLINICAL DATA:  Left-sided chest pain.  Shortness of breath. EXAM: CT ANGIOGRAPHY CHEST WITH CONTRAST TECHNIQUE: Multidetector CT imaging of the chest was performed using the standard protocol during bolus administration of intravenous contrast. Multiplanar CT image reconstructions and MIPs were obtained to evaluate the vascular anatomy. CONTRAST:  80 cc Isovue 370 IV COMPARISON:  Chest radiograph yesterday.  Chest CT 10/31/2014 FINDINGS: Cardiovascular: There are no filling defects within the pulmonary arteries to suggest pulmonary embolus. Atherosclerosis of the thoracic aorta with ectasia, no aneurysm. Cardiomegaly with coronary artery calcifications, patient is post CABG. No pericardial effusion. Mediastinum/Nodes: Multiple small mediastinal and hilar nodes, not enlarged by size criteria. Moderate-sized hiatal hernia. More proximal esophagus is patulous. Trachea is patent. No evidence of thyroid nodule. Lungs/Pleura: Dependent atelectasis in the lower lobes, right greater than  left. Minimal smooth septal thickening. No confluent airspace disease. Calcified granuloma in the right upper lobe. No pulmonary mass. No pleural effusion. Upper Abdomen: Hepatic steatosis.  Gallbladder appears distended. Musculoskeletal: There are no acute or suspicious osseous abnormalities. Review of the MIP images confirms the above findings. IMPRESSION: 1. No pulmonary embolus. 2. Suspect minimal pulmonary edema. 3. Moderate-sized hiatal hernia. 4. Distended gallbladder is partially included in the upper abdomen. If there are any right upper quadrant symptoms or clinical concern for biliary pathology, recommend ultrasound evaluation. Electronically Signed   By: Jeb Levering M.D.   On: 04/20/2016 05:37  US Abdomen Complete  Result Date: 04/22/2016 CLINICAL DATA:  Elevated LFTs.  Chest pain. EXAM: ABDOMEN ULTRASOUND COMPLETE COMPARISON:  Chest CT yesterday. FINDINGS: Gallbladder: Distended. There are intraluminal septations versus intraluminal cysts. No nodular or soft tissue component is visualized. No flow on color Doppler imaging. No gallstones or wall thickening visualized. No sonographic Murphy sign noted by sonographer. Common bile duct: Diameter: 3 mm, normal. Liver: No focal lesion identified. Within normal limits in parenchymal echogenicity. Normal directional flow in the main portal vein. IVC: No abnormality visualized. Pancreas: Not visualized due to bowel gas. Spleen: Enlarged measuring 13 cm. Right Kidney: Length: 13.0 cm. Echogenicity within normal limits. There is a 1.4 cm cyst in the interpolar kidney. No solid mass or hydronephrosis visualized. Left Kidney: Length: 15.0 cm. Probable duplicated renal collecting system. Echogenicity within normal limits. There is a 2.2 cm cyst in the mid kidney. No solid mass or hydronephrosis visualized. Abdominal aorta: No aneurysm visualized. Other findings: No ascites.  Right pleural fluid is visualized. IMPRESSION: 1. Gallbladder distention.  Intraluminal gallbladder septations versus cysts. Both conditions are rare, however may be congenital or acquired. This can be a sequela of prior acute cholecystitis. No solid component or vascularity. No gallstones are visualized. No findings of acute cholecystitis. 2. Normal sonographic appearance of the liver. No biliary dilatation. 3. Mild splenomegaly. 4. Bilateral renal cysts. Electronically Signed   By: Jeb Levering M.D.   On: 04/22/2016 01:33   Dg Chest Port 1 View  Result Date: 04/21/2016 CLINICAL DATA:  Shortness of breath for 2 weeks EXAM: PORTABLE CHEST 1 VIEW COMPARISON:  Chest radiograph April 19, 2016; chest CT April 20, 2016 FINDINGS: There is generalized interstitial edema. No airspace consolidation. There is cardiomegaly with pulmonary venous hypertension. There is atherosclerotic calcification in the aorta. Patient is status post coronary artery bypass grafting. No adenopathy. IMPRESSION: Congestive heart failure with overall increase in pulmonary edema. No airspace consolidation. There is aortic atherosclerosis. Electronically Signed   By: Lowella Grip III M.D.   On: 04/21/2016 08:51   Dg Chest Port 1 View  Result Date: 04/19/2016 CLINICAL DATA:  Chest pain central in nature, nonradiating EXAM: PORTABLE CHEST 1 VIEW COMPARISON:  03/31/2016 FINDINGS: AP portable view chest. Median sternotomy wires again evident. Low lung volumes. Mild cardiomegaly. Mild central vascular congestion and suggestion of mild perihilar edema. Atherosclerosis of the aortic arch. No pneumothorax. Old left clavicular fracture. Hiatal hernia is less apparent. IMPRESSION: 1. Low lung volumes. 2. Mild cardiomegaly with central vascular congestion and findings suggestive of mild perihilar edema. 3. No acute consolidation 4. Atherosclerotic vascular calcification of the aorta Electronically Signed   By: Donavan Foil M.D.   On: 04/19/2016 20:03    Microbiology: Recent Results (from the past 240  hour(s))  Culture, blood (Routine X 2) w Reflex to ID Panel     Status: Abnormal   Collection Time: 04/19/16  7:20 PM  Result Value Ref Range Status   Specimen Description BLOOD RIGHT ANTECUBITAL  Final   Special Requests BOTTLES DRAWN AEROBIC AND ANAEROBIC 5CC  Final   Culture  Setup Time   Final    GRAM NEGATIVE RODS IN BOTH AEROBIC AND ANAEROBIC BOTTLES Organism ID to follow CRITICAL RESULT CALLED TO, READ BACK BY AND VERIFIED WITH: Hughie Closs Pharm.D. 12:20 04/20/16 ([email or web address])    Culture ESCHERICHIA COLI (A)  Final   Report Status 04/22/2016 FINAL  Final   Organism ID, Bacteria ESCHERICHIA COLI  Final      Susceptibility  Escherichia coli - MIC*    AMPICILLIN 8 SENSITIVE Sensitive     CEFAZOLIN <=4 SENSITIVE Sensitive     CEFEPIME <=1 SENSITIVE Sensitive     CEFTAZIDIME <=1 SENSITIVE Sensitive     CEFTRIAXONE <=1 SENSITIVE Sensitive     CIPROFLOXACIN <=0.25 SENSITIVE Sensitive     GENTAMICIN <=1 SENSITIVE Sensitive     IMIPENEM <=0.25 SENSITIVE Sensitive     TRIMETH/SULFA <=20 SENSITIVE Sensitive     AMPICILLIN/SULBACTAM <=2 SENSITIVE Sensitive     PIP/TAZO <=4 SENSITIVE Sensitive     Extended ESBL NEGATIVE Sensitive     * ESCHERICHIA COLI  Blood Culture ID Panel (Reflexed)     Status: Abnormal   Collection Time: 04/19/16  7:20 PM  Result Value Ref Range Status   Enterococcus species NOT DETECTED NOT DETECTED Final   Listeria monocytogenes NOT DETECTED NOT DETECTED Final   Staphylococcus species NOT DETECTED NOT DETECTED Final   Staphylococcus aureus NOT DETECTED NOT DETECTED Final   Streptococcus species NOT DETECTED NOT DETECTED Final   Streptococcus agalactiae NOT DETECTED NOT DETECTED Final   Streptococcus pneumoniae NOT DETECTED NOT DETECTED Final   Streptococcus pyogenes NOT DETECTED NOT DETECTED Final   Acinetobacter baumannii NOT DETECTED NOT DETECTED Final   Enterobacteriaceae species DETECTED (A) NOT DETECTED Final    Comment: CRITICAL RESULT CALLED TO, READ  BACK BY AND VERIFIED WITH: Hughie Closs Pharm.D. 12:20 04/20/16 ([email or web address])    Enterobacter cloacae complex NOT DETECTED NOT DETECTED Final   Escherichia coli DETECTED (A) NOT DETECTED Final    Comment: CRITICAL RESULT CALLED TO, READ BACK BY AND VERIFIED WITH: Hughie Closs Pharm.D. 12:20 04/20/16 ([email or web address])    Klebsiella oxytoca NOT DETECTED NOT DETECTED Final   Klebsiella pneumoniae NOT DETECTED NOT DETECTED Final   Proteus species NOT DETECTED NOT DETECTED Final   Serratia marcescens NOT DETECTED NOT DETECTED Final   Carbapenem resistance NOT DETECTED NOT DETECTED Final   Haemophilus influenzae NOT DETECTED NOT DETECTED Final   Neisseria meningitidis NOT DETECTED NOT DETECTED Final   Pseudomonas aeruginosa NOT DETECTED NOT DETECTED Final   Candida albicans NOT DETECTED NOT DETECTED Final   Candida glabrata NOT DETECTED NOT DETECTED Final   Candida krusei NOT DETECTED NOT DETECTED Final   Candida parapsilosis NOT DETECTED NOT DETECTED Final   Candida tropicalis NOT DETECTED NOT DETECTED Final  Culture, blood (Routine X 2) w Reflex to ID Panel     Status: None (Preliminary result)   Collection Time: 04/19/16  7:22 PM  Result Value Ref Range Status   Specimen Description BLOOD LEFT ANTECUBITAL  Final   Special Requests BOTTLES DRAWN AEROBIC AND ANAEROBIC 5CC  Final   Culture NO GROWTH 4 DAYS  Final   Report Status PENDING  Incomplete  Urine culture     Status: Abnormal   Collection Time: 04/19/16  7:35 PM  Result Value Ref Range Status   Specimen Description URINE, RANDOM  Final   Special Requests NONE  Final   Culture 30,000 COLONIES/mL KLEBSIELLA OXYTOCA (A)  Final   Report Status 04/22/2016 FINAL  Final   Organism ID, Bacteria KLEBSIELLA OXYTOCA (A)  Final      Susceptibility   Klebsiella oxytoca - MIC*    AMPICILLIN >=32 RESISTANT Resistant     CEFAZOLIN 8 SENSITIVE Sensitive     CEFTRIAXONE <=1 SENSITIVE Sensitive     CIPROFLOXACIN <=0.25 SENSITIVE Sensitive     GENTAMICIN <=1  SENSITIVE Sensitive     IMIPENEM <=0.25 SENSITIVE Sensitive  NITROFURANTOIN <=16 SENSITIVE Sensitive     TRIMETH/SULFA <=20 SENSITIVE Sensitive     AMPICILLIN/SULBACTAM 8 SENSITIVE Sensitive     PIP/TAZO <=4 SENSITIVE Sensitive     Extended ESBL NEGATIVE Sensitive     * 30,000 COLONIES/mL KLEBSIELLA OXYTOCA  Culture, blood (routine x 2)     Status: None (Preliminary result)   Collection Time: 04/21/16 11:17 AM  Result Value Ref Range Status   Specimen Description BLOOD LEFT ARM  Final   Special Requests IN PEDIATRIC BOTTLE 3CC  Final   Culture NO GROWTH 2 DAYS  Final   Report Status PENDING  Incomplete  Culture, blood (routine x 2)     Status: None (Preliminary result)   Collection Time: 04/21/16 11:21 AM  Result Value Ref Range Status   Specimen Description BLOOD RIGHT HAND  Final   Special Requests IN PEDIATRIC BOTTLE 3CC  Final   Culture NO GROWTH 2 DAYS  Final   Report Status PENDING  Incomplete     Labs: Basic Metabolic Panel:  Recent Labs Lab 04/19/16 1850  04/20/16 0252 04/21/16 0522 04/22/16 0337 04/23/16 0615 04/24/16 0541  NA 134*  < > 136 137 138 136 136  K 4.2  < > 3.9 3.5 3.0* 3.4* 3.5  CL 102  < > 107 109 104 102 104  CO2 22  --  21* 20* 26 27 23   GLUCOSE 170*  < > 162* 118* 139* 152* 136*  BUN 18  < > 17 14 12 14 12   CREATININE 1.10  < > 1.04 0.81 0.85 0.82 0.74  CALCIUM 8.4*  --  7.9* 8.1* 8.2* 8.4* 8.8*  MG 2.0  --   --   --   --  2.1  --   PHOS 3.5  --   --   --   --   --   --   < > = values in this interval not displayed. Liver Function Tests:  Recent Labs Lab 04/19/16 1850 04/20/16 0252 04/21/16 0522 04/22/16 0337 04/23/16 0615  AST 165* 200* 85* 49* 31  ALT 70* 128* 94* 72* 57  ALKPHOS 168* 201* 159* 163* 180*  BILITOT 2.5* 4.2* 4.7* 2.6* 1.8*  PROT 6.5 5.2* 5.8* 5.8* 6.1*  ALBUMIN 3.2* 2.7* 2.8* 2.7* 2.7*   No results for input(s): LIPASE, AMYLASE in the last 168 hours. No results for input(s): AMMONIA in the last 168  hours. CBC:  Recent Labs Lab 04/19/16 1850 04/19/16 1856 04/20/16 0252 04/21/16 0522 04/22/16 0337 04/23/16 1018  WBC 10.6*  --  14.7* 12.9* 9.7 8.4  NEUTROABS 9.7*  --  13.1* 10.7*  --   --   HGB 9.3* 10.2* 8.6* 8.7* 8.5* 9.4*  HCT 29.3* 30.0* 26.9* 27.2* 26.8* 30.1*  MCV 79.2  --  79.1 79.3 78.4 78.2  PLT 386  --  353 379 372 385   Cardiac Enzymes: No results for input(s): CKTOTAL, CKMB, CKMBINDEX, TROPONINI in the last 168 hours. BNP: BNP (last 3 results) No results for input(s): BNP in the last 8760 hours.  ProBNP (last 3 results) No results for input(s): PROBNP in the last 8760 hours.  CBG:  Recent Labs Lab 04/23/16 0757 04/23/16 1140 04/23/16 1649 04/23/16 2107 04/24/16 0740  GLUCAP 141* 134* 153* 160* 136*

## 2016-04-25 ENCOUNTER — Telehealth: Payer: Self-pay

## 2016-04-25 NOTE — Telephone Encounter (Signed)
Transition Care Management Follow-up Telephone Call    Date discharged? 04/24/2016  How have you been since you were released from the hospital? [name].   Any patient concerns? None at this time.    Items Reviewed:  Medications reviewed: Yes   Allergies reviewed: Yes  Dietary changes reviewed: N/A  Referrals reviewed: N/A   Functional Questionnaire:  Independent - I Dependent - D    Activities of Daily Living (ADLs):    Personal hygiene - I Dressing - I Eating - I Maintaining continence -I  Transferring - I (uses a cane and walker for ambulation and transfers)  Independent Activities of Daily Living (iADLs): Basic communication skills - I Transportation - D (does not drive; temporary due to recent TKR) Meal preparation  - D (spouse prepares meals) Shopping - D Housework - D  Managing medications - I (spouse may assist as needed) Managing personal finances - I (joint task with spouse)   Confirmed importance and date/time of follow-up visits scheduled N/A  Provider Appointment booked with PCP  Confirmed with patient if condition begins to worsen call PCP or go to the ER.  Patient was given the office number and encouraged to call back with question or concerns: YES

## 2016-04-26 LAB — CULTURE, BLOOD (ROUTINE X 2)
CULTURE: NO GROWTH
CULTURE: NO GROWTH

## 2016-04-27 DIAGNOSIS — Z96652 Presence of left artificial knee joint: Secondary | ICD-10-CM | POA: Diagnosis not present

## 2016-04-27 DIAGNOSIS — Z471 Aftercare following joint replacement surgery: Secondary | ICD-10-CM | POA: Diagnosis not present

## 2016-04-28 DIAGNOSIS — R262 Difficulty in walking, not elsewhere classified: Secondary | ICD-10-CM | POA: Diagnosis not present

## 2016-04-28 DIAGNOSIS — M1712 Unilateral primary osteoarthritis, left knee: Secondary | ICD-10-CM | POA: Diagnosis not present

## 2016-04-28 DIAGNOSIS — M25662 Stiffness of left knee, not elsewhere classified: Secondary | ICD-10-CM | POA: Diagnosis not present

## 2016-04-28 DIAGNOSIS — Z96652 Presence of left artificial knee joint: Secondary | ICD-10-CM | POA: Diagnosis not present

## 2016-05-03 ENCOUNTER — Encounter: Payer: Self-pay | Admitting: Family Medicine

## 2016-05-03 ENCOUNTER — Ambulatory Visit (INDEPENDENT_AMBULATORY_CARE_PROVIDER_SITE_OTHER): Payer: Medicare HMO | Admitting: Family Medicine

## 2016-05-03 VITALS — BP 109/57 | HR 86 | Temp 98.3°F | Ht 66.5 in | Wt 178.2 lb

## 2016-05-03 DIAGNOSIS — R262 Difficulty in walking, not elsewhere classified: Secondary | ICD-10-CM | POA: Diagnosis not present

## 2016-05-03 DIAGNOSIS — R945 Abnormal results of liver function studies: Secondary | ICD-10-CM

## 2016-05-03 DIAGNOSIS — M1712 Unilateral primary osteoarthritis, left knee: Secondary | ICD-10-CM | POA: Diagnosis not present

## 2016-05-03 DIAGNOSIS — R7989 Other specified abnormal findings of blood chemistry: Secondary | ICD-10-CM

## 2016-05-03 DIAGNOSIS — A4151 Sepsis due to Escherichia coli [E. coli]: Secondary | ICD-10-CM

## 2016-05-03 DIAGNOSIS — Z8669 Personal history of other diseases of the nervous system and sense organs: Secondary | ICD-10-CM

## 2016-05-03 DIAGNOSIS — Z8619 Personal history of other infectious and parasitic diseases: Secondary | ICD-10-CM

## 2016-05-03 DIAGNOSIS — R7881 Bacteremia: Secondary | ICD-10-CM

## 2016-05-03 DIAGNOSIS — D649 Anemia, unspecified: Secondary | ICD-10-CM

## 2016-05-03 DIAGNOSIS — G9340 Encephalopathy, unspecified: Secondary | ICD-10-CM

## 2016-05-03 DIAGNOSIS — M25662 Stiffness of left knee, not elsewhere classified: Secondary | ICD-10-CM | POA: Diagnosis not present

## 2016-05-03 DIAGNOSIS — Z96652 Presence of left artificial knee joint: Secondary | ICD-10-CM | POA: Diagnosis not present

## 2016-05-03 DIAGNOSIS — Z96659 Presence of unspecified artificial knee joint: Secondary | ICD-10-CM

## 2016-05-03 LAB — CBC WITH DIFFERENTIAL/PLATELET
BASOS ABS: 0.1 10*3/uL (ref 0.0–0.1)
Basophils Relative: 0.6 % (ref 0.0–3.0)
Eosinophils Absolute: 0.3 10*3/uL (ref 0.0–0.7)
Eosinophils Relative: 2.8 % (ref 0.0–5.0)
HEMATOCRIT: 31.8 % — AB (ref 39.0–52.0)
HEMOGLOBIN: 10.4 g/dL — AB (ref 13.0–17.0)
LYMPHS PCT: 20.9 % (ref 12.0–46.0)
Lymphs Abs: 2.2 10*3/uL (ref 0.7–4.0)
MCHC: 32.6 g/dL (ref 30.0–36.0)
MCV: 78.4 fl (ref 78.0–100.0)
MONOS PCT: 7.6 % (ref 3.0–12.0)
Monocytes Absolute: 0.8 10*3/uL (ref 0.1–1.0)
Neutro Abs: 7.3 10*3/uL (ref 1.4–7.7)
Neutrophils Relative %: 68.1 % (ref 43.0–77.0)
Platelets: 524 10*3/uL — ABNORMAL HIGH (ref 150.0–400.0)
RBC: 4.06 Mil/uL — AB (ref 4.22–5.81)
RDW: 17.4 % — ABNORMAL HIGH (ref 11.5–15.5)
WBC: 10.7 10*3/uL — AB (ref 4.0–10.5)

## 2016-05-03 LAB — COMPREHENSIVE METABOLIC PANEL
ALK PHOS: 130 U/L — AB (ref 39–117)
ALT: 23 U/L (ref 0–53)
AST: 17 U/L (ref 0–37)
Albumin: 3.9 g/dL (ref 3.5–5.2)
BILIRUBIN TOTAL: 0.8 mg/dL (ref 0.2–1.2)
BUN: 18 mg/dL (ref 6–23)
CALCIUM: 9.5 mg/dL (ref 8.4–10.5)
CO2: 27 meq/L (ref 19–32)
Chloride: 102 mEq/L (ref 96–112)
Creatinine, Ser: 0.91 mg/dL (ref 0.40–1.50)
GFR: 85.34 mL/min (ref >60.00)
Glucose, Bld: 165 mg/dL — ABNORMAL HIGH (ref 70–99)
Potassium: 4.4 mEq/L (ref 3.5–5.1)
Sodium: 136 mEq/L (ref 135–145)
TOTAL PROTEIN: 7.1 g/dL (ref 6.0–8.3)

## 2016-05-03 NOTE — Assessment & Plan Note (Signed)
Resolved after antibitoics

## 2016-05-03 NOTE — Assessment & Plan Note (Signed)
Likely due to recent op.Megan Mans today.

## 2016-05-03 NOTE — Patient Instructions (Addendum)
Stop at lab on way out. Hold crestor for now.

## 2016-05-03 NOTE — Progress Notes (Signed)
Subjective:    Patient ID: [name], male    DOB: September 17, 1934, 81 y.o.   MRN: [id number]  HPI   81 year old male presents for hospital follow up. Admitted on 04/19/2016 for sepsis Discharged 10/15 Principal Problem:   Sepsis due to Escherichia coli (E. coli) ([place]) Active Problems:   E coli bacteremia   Acute encephalopathy  Sepsis due to E.Coli ([place]) / Leukocytosis / E.Coli bacteremia / Klebsiella UTI - Sepsis crietria emt on admission with fever, tachycardia, tachynea, hypoxia, leukocytosi and lactic acidosis - Source of infection E.Coli bacteremia, likely form possible UTI - Urine culture is growing Klebsiella species - Repeat blood cultures 04/21/2016 show no growth  - Stopped vanco 10/11, stopped zosyn 10/13 and started rocephin 10/13  Had knee surgery,? Cath in hospital post op?   Hospital Course: Acute metabolic encephalopathy - Altered mental status on the admission likely in the setting of sepsis - Mental status at baseline, oriented to time, place and person  Acute respiratory failure with hypoxia - More short of breath at 04/21/2016 so order placed for chest x-ray which showed congestive heart failure with increase in pulmonary edema but no airspace consolidation - Started IV Lasix 40 mg daily 04/21/2016.Resp status better - 2 D ECHO in 10/2015 showed EF 60%, grade 2 DD - CT angio chest ruled out PE  Dyslipidemia associated with type 2 DM - Holding Crestor due to elevated LFT's   Elevated LFT's - LFTs improving - Abdominal ultrasound did not show acute cholecystitis or gallstones. There is gallbladder distention however and septations versus cysts, per radiology report both conditions are rare but may be congenital or acquired or sequela of prior acute cholecystitis.   TODAY pt reports:  Feels really tired. No N/V, no fever. No abdominal pain. Drinking liquids, decreased po overall.  No urinary symptoms other than urine was dark and odor. No further  confusion. Finished antibiotics 3 days ago.  Still requiring pain medicine daily for knee.. Causing decreased appetite. Doing PT.  BP Readings from Last 3 Encounters:  05/03/16 (!) 109/57  04/24/16 (!) 168/80  04/12/16 (!) 124/48   Wt Readings from Last 3 Encounters:  05/03/16 178 lb 4 oz (80.9 kg)  04/22/16 194 lb 7.1 oz (88.2 kg)  04/11/16 189 lb (85.7 kg)   Social History /Family History/Past Medical History reviewed and updated if needed.  He does take take crestor 5 mg daily.   Review of Systems  Constitutional: Positive for fatigue. Negative for fever.  HENT: Negative for ear pain.   Eyes: Negative for pain.  Respiratory: Negative for shortness of breath.   Cardiovascular: Negative for chest pain.  Gastrointestinal: Negative for abdominal distention and abdominal pain.       Objective:   Physical Exam  Constitutional: Vital signs are normal. He appears well-developed and well-nourished.  HENT:  Head: Normocephalic.  Right Ear: Hearing normal.  Left Ear: Hearing normal.  Nose: Nose normal.  Mouth/Throat: Oropharynx is clear and moist and mucous membranes are normal.  Neck: Trachea normal. Carotid bruit is not present. No thyroid mass and no thyromegaly present.  Cardiovascular: Normal rate, regular rhythm and normal pulses.  Exam reveals no gallop, no distant heart sounds and no friction rub.   No murmur heard. No peripheral edema  Pulmonary/Chest: Effort normal and breath sounds normal. No respiratory distress.  Abdominal: There is no hepatosplenomegaly. There is no tenderness. There is no rebound, no CVA tenderness, no tenderness at McBurney's point and negative Murphy's  sign. No hernia.  Skin: Skin is warm, dry and intact. No rash noted.  Psychiatric: He has a normal mood and affect. His speech is normal and behavior is normal. Thought content normal.    MSK: antalgic gait,  knee scar healing well.         Assessment & Plan:

## 2016-05-03 NOTE — Assessment & Plan Note (Signed)
Resolved after antibitoics.

## 2016-05-03 NOTE — Assessment & Plan Note (Signed)
Resolved

## 2016-05-03 NOTE — Assessment & Plan Note (Signed)
Hold statin. ? Cause.  Galbbladder with septations vs cyst... Re-eval LFTs today  If still labnormal.. Consider further work up.

## 2016-05-04 DIAGNOSIS — A4151 Sepsis due to Escherichia coli [E. coli]: Secondary | ICD-10-CM | POA: Diagnosis not present

## 2016-05-04 NOTE — Addendum Note (Signed)
Addended by: Carter Kitten on: 05/04/2016 03:20 PM   Modules accepted: Orders

## 2016-05-04 NOTE — Addendum Note (Signed)
Addended by: Carter Kitten on: 05/04/2016 10:37 AM   Modules accepted: Orders

## 2016-05-05 LAB — URINALYSIS
BILIRUBIN URINE: NEGATIVE
Hgb urine dipstick: NEGATIVE
Ketones, ur: NEGATIVE
Leukocytes, UA: NEGATIVE
NITRITE: NEGATIVE
PH: 6 (ref 5.0–8.0)
Specific Gravity, Urine: 1.015 (ref 1.000–1.030)
TOTAL PROTEIN, URINE-UPE24: NEGATIVE
URINE GLUCOSE: NEGATIVE
Urobilinogen, UA: 0.2 (ref 0.0–1.0)

## 2016-05-05 LAB — URINALYSIS, MICROSCOPIC ONLY: RBC / HPF: NONE SEEN (ref 0–?)

## 2016-05-06 ENCOUNTER — Other Ambulatory Visit: Payer: Self-pay | Admitting: Family Medicine

## 2016-05-06 DIAGNOSIS — D72829 Elevated white blood cell count, unspecified: Secondary | ICD-10-CM

## 2016-05-07 LAB — URINE CULTURE

## 2016-05-10 DIAGNOSIS — M25662 Stiffness of left knee, not elsewhere classified: Secondary | ICD-10-CM | POA: Diagnosis not present

## 2016-05-10 DIAGNOSIS — R262 Difficulty in walking, not elsewhere classified: Secondary | ICD-10-CM | POA: Diagnosis not present

## 2016-05-10 DIAGNOSIS — Z96652 Presence of left artificial knee joint: Secondary | ICD-10-CM | POA: Diagnosis not present

## 2016-05-13 ENCOUNTER — Other Ambulatory Visit (INDEPENDENT_AMBULATORY_CARE_PROVIDER_SITE_OTHER): Payer: Medicare HMO

## 2016-05-13 DIAGNOSIS — D72829 Elevated white blood cell count, unspecified: Secondary | ICD-10-CM | POA: Diagnosis not present

## 2016-05-13 LAB — CBC WITH DIFFERENTIAL/PLATELET
BASOS ABS: 0 10*3/uL (ref 0.0–0.1)
Basophils Relative: 0.6 % (ref 0.0–3.0)
EOS ABS: 0.3 10*3/uL (ref 0.0–0.7)
EOS PCT: 4.8 % (ref 0.0–5.0)
HCT: 35.4 % — ABNORMAL LOW (ref 39.0–52.0)
HEMOGLOBIN: 11.4 g/dL — AB (ref 13.0–17.0)
Lymphocytes Relative: 28.9 % (ref 12.0–46.0)
Lymphs Abs: 2 10*3/uL (ref 0.7–4.0)
MCHC: 32.1 g/dL (ref 30.0–36.0)
MCV: 79.5 fl (ref 78.0–100.0)
MONO ABS: 0.6 10*3/uL (ref 0.1–1.0)
Monocytes Relative: 9.1 % (ref 3.0–12.0)
Neutro Abs: 3.9 10*3/uL (ref 1.4–7.7)
Neutrophils Relative %: 56.6 % (ref 43.0–77.0)
Platelets: 322 10*3/uL (ref 150.0–400.0)
RBC: 4.45 Mil/uL (ref 4.22–5.81)
RDW: 17.8 % — ABNORMAL HIGH (ref 11.5–15.5)
WBC: 6.9 10*3/uL (ref 4.0–10.5)

## 2016-05-17 DIAGNOSIS — Z96652 Presence of left artificial knee joint: Secondary | ICD-10-CM | POA: Diagnosis not present

## 2016-05-17 DIAGNOSIS — M25662 Stiffness of left knee, not elsewhere classified: Secondary | ICD-10-CM | POA: Diagnosis not present

## 2016-05-17 DIAGNOSIS — R262 Difficulty in walking, not elsewhere classified: Secondary | ICD-10-CM | POA: Diagnosis not present

## 2016-05-17 DIAGNOSIS — M1712 Unilateral primary osteoarthritis, left knee: Secondary | ICD-10-CM | POA: Diagnosis not present

## 2016-05-24 ENCOUNTER — Other Ambulatory Visit: Payer: Self-pay | Admitting: *Deleted

## 2016-05-24 MED ORDER — METOPROLOL TARTRATE 25 MG PO TABS: ORAL_TABLET | ORAL | 2 refills | Status: DC

## 2016-05-26 DIAGNOSIS — M25662 Stiffness of left knee, not elsewhere classified: Secondary | ICD-10-CM | POA: Diagnosis not present

## 2016-06-08 DIAGNOSIS — M1712 Unilateral primary osteoarthritis, left knee: Secondary | ICD-10-CM | POA: Diagnosis not present

## 2016-06-08 DIAGNOSIS — Z96652 Presence of left artificial knee joint: Secondary | ICD-10-CM | POA: Diagnosis not present

## 2016-06-08 DIAGNOSIS — M25662 Stiffness of left knee, not elsewhere classified: Secondary | ICD-10-CM | POA: Diagnosis not present

## 2016-06-08 DIAGNOSIS — R262 Difficulty in walking, not elsewhere classified: Secondary | ICD-10-CM | POA: Diagnosis not present

## 2016-06-28 ENCOUNTER — Other Ambulatory Visit: Payer: Medicare HMO | Admitting: *Deleted

## 2016-06-28 DIAGNOSIS — E785 Hyperlipidemia, unspecified: Secondary | ICD-10-CM

## 2016-06-28 LAB — LIPID PANEL
CHOL/HDL RATIO: 5.9 ratio — AB (ref <5.0)
Cholesterol: 184 mg/dL (ref <200)
HDL: 31 mg/dL — AB (ref >40)
LDL CALC: 130 mg/dL — AB (ref <100)
TRIGLYCERIDES: 115 mg/dL (ref <150)
VLDL: 23 mg/dL (ref <30)

## 2016-06-28 LAB — COMPREHENSIVE METABOLIC PANEL
ALT: 10 U/L (ref 9–46)
AST: 13 U/L (ref 10–35)
Albumin: 4.1 g/dL (ref 3.6–5.1)
Alkaline Phosphatase: 48 U/L (ref 40–115)
BUN: 17 mg/dL (ref 7–25)
CHLORIDE: 109 mmol/L (ref 98–110)
CO2: 26 mmol/L (ref 20–31)
Calcium: 9 mg/dL (ref 8.6–10.3)
Creat: 0.86 mg/dL (ref 0.70–1.18)
Glucose, Bld: 123 mg/dL — ABNORMAL HIGH (ref 65–99)
POTASSIUM: 3.9 mmol/L (ref 3.5–5.3)
Sodium: 143 mmol/L (ref 135–146)
TOTAL PROTEIN: 6.7 g/dL (ref 6.1–8.1)
Total Bilirubin: 0.4 mg/dL (ref 0.2–1.2)

## 2016-06-29 ENCOUNTER — Other Ambulatory Visit: Payer: Self-pay | Admitting: *Deleted

## 2016-06-29 DIAGNOSIS — E785 Hyperlipidemia, unspecified: Secondary | ICD-10-CM

## 2016-06-29 NOTE — Progress Notes (Signed)
lipid

## 2016-08-03 ENCOUNTER — Telehealth: Payer: Self-pay | Admitting: Cardiovascular Disease

## 2016-08-03 NOTE — Telephone Encounter (Signed)
°*  STAT* If patient is at the pharmacy, call can be transferred to refill team.   1. Which medications need to be refilled? (please list name of each medication and dose if known)new prescription for Isosorbide  2. Which pharmacy/location (including street and city if local pharmacy) is medication to be sent to?[facility] [phone] 3. Do they need a 30 day or 90 day supply?90 and refills

## 2016-08-04 ENCOUNTER — Other Ambulatory Visit: Payer: Self-pay | Admitting: *Deleted

## 2016-08-04 ENCOUNTER — Other Ambulatory Visit: Payer: Self-pay

## 2016-08-04 MED ORDER — ISOSORBIDE MONONITRATE ER 30 MG PO TB24: 30.0000 mg | ORAL_TABLET | Freq: Every day | ORAL | 0 refills | Status: DC

## 2016-08-04 NOTE — Telephone Encounter (Signed)
I spoke with pt who reports he has been taking Imdur 30 mg daily. Will send refill to pharmacy.  He is aware to keep scheduled appt with Dr. Angelena Form in March.

## 2016-09-21 ENCOUNTER — Other Ambulatory Visit: Payer: Medicare HMO

## 2016-09-22 ENCOUNTER — Other Ambulatory Visit: Payer: Medicare HMO | Admitting: *Deleted

## 2016-09-22 DIAGNOSIS — I10 Essential (primary) hypertension: Secondary | ICD-10-CM

## 2016-09-22 DIAGNOSIS — I2510 Atherosclerotic heart disease of native coronary artery without angina pectoris: Secondary | ICD-10-CM

## 2016-09-22 DIAGNOSIS — E785 Hyperlipidemia, unspecified: Secondary | ICD-10-CM

## 2016-09-22 LAB — HEPATIC FUNCTION PANEL
ALBUMIN: 4.4 g/dL (ref 3.5–4.8)
ALK PHOS: 49 IU/L (ref 39–117)
ALT: 15 IU/L (ref 0–44)
AST: 21 IU/L (ref 0–40)
BILIRUBIN, DIRECT: 0.13 mg/dL (ref 0.00–0.40)
Bilirubin Total: 0.4 mg/dL (ref 0.0–1.2)
TOTAL PROTEIN: 6.8 g/dL (ref 6.0–8.5)

## 2016-09-22 LAB — LIPID PANEL
CHOLESTEROL TOTAL: 122 mg/dL (ref 100–199)
Chol/HDL Ratio: 3.1 ratio units (ref 0.0–5.0)
HDL: 39 mg/dL — ABNORMAL LOW (ref >39)
LDL Calculated: 63 mg/dL (ref 0–99)
Triglycerides: 98 mg/dL (ref 0–149)
VLDL CHOLESTEROL CAL: 20 mg/dL (ref 5–40)

## 2016-09-27 ENCOUNTER — Encounter: Payer: Self-pay | Admitting: Cardiovascular Disease

## 2016-10-07 ENCOUNTER — Encounter: Payer: Self-pay | Admitting: Cardiovascular Disease

## 2016-10-07 ENCOUNTER — Ambulatory Visit (INDEPENDENT_AMBULATORY_CARE_PROVIDER_SITE_OTHER): Payer: Medicare HMO | Admitting: Cardiovascular Disease

## 2016-10-07 VITALS — BP 160/84 | HR 66 | Ht 66.5 in | Wt 192.1 lb

## 2016-10-07 DIAGNOSIS — I10 Essential (primary) hypertension: Secondary | ICD-10-CM

## 2016-10-07 DIAGNOSIS — I2510 Atherosclerotic heart disease of native coronary artery without angina pectoris: Secondary | ICD-10-CM | POA: Diagnosis not present

## 2016-10-07 DIAGNOSIS — E7800 Pure hypercholesterolemia, unspecified: Secondary | ICD-10-CM | POA: Diagnosis not present

## 2016-10-07 NOTE — Progress Notes (Signed)
Chief Complaint  Patient presents with  . Follow-up     History of Present Illness: 82 yo WM with history of CAD s/p CABG 2016, prior coronary stent placement before his CABG, HTN, Hyperlipidemia and GERD who presents today for cardiac follow up. He has been followed for CAD since 2005 when he had a LAD stent placed. He has not tolerated statins in the past secondary to myalgias. He was evaluated April 2016 for chest pain. Cardiac catheterization 10/31/14 with multivessel disease with severe 95% distal left main stenosis. He was admitted 4/22-11/09/14 and underwent CABG 4 (LIMA-LAD, SVG-DX, SVG-PDA, SVG-OM) per Dr. Prescott Gum. Postoperative course was notable for frequent PACs. He was placed on amiodarone but this was discontinued secondary to significant nausea. He otherwise remained in sinus rhythm. Otherwise, his postoperative course was fairly uneventful. Continued dyspnea post bypass. Echo April 2017 with normal LV function and no significant valvular disease.   He is here today for follow up. No chest pain or dyspnea. He lost his daughter in June 2017 to cancer. No LE edema. He has been active  Primary Care Physician: Eliezer Lofts, MD   Past Medical History:  Diagnosis Date  . Coronary atherosclerosis of unspecified type of vessel, native or graft    s/p stent mid LAD 2005  . Depression   . DJD (degenerative joint disease)   . GERD (gastroesophageal reflux disease)   . History of hiatal hernia   . HTN (hypertension)   . Hyperlipidemia   . Pre-diabetes     Past Surgical History:  Procedure Laterality Date  . BRAIN SURGERY    . CARDIAC CATHETERIZATION    . CORONARY ARTERY BYPASS GRAFT N/A 11/03/2014   Procedure: CORONARY ARTERY BYPASS GRAFTING (CABG), ON PUMP, TIMES FOUR, USING LEFT INTERNAL MAMMARY, RIGHT GREATER SAPHENOUS VEIN HARVESTED ENDOSCOPICALLY;  Surgeon: Ivin Poot, MD;  Location: [facility];  Service: Open Heart Surgery;  Laterality: N/A;  LIMA-LAD; SVG-DIAG; SVG-OM;  SVG-RCA  . CRANIOTOMY N/A 01/18/2014   Procedure: CRANIOTOMY HEMATOMA EVACUATION SUBDURAL;  Surgeon: Hosie Spangle, MD;  Location: [facility];  Service: Neurosurgery;  Laterality: N/A;  . kidney stone removal    . KNEE SURGERY    . LEFT HEART CATHETERIZATION WITH CORONARY ANGIOGRAM N/A 07/27/2011   Procedure: LEFT HEART CATHETERIZATION WITH CORONARY ANGIOGRAM;  Surgeon: Burnell Blanks, MD;  Location[facility] C[facility] LAB;  Service: Cardiovascular;  Laterality: N/A;  . LEFT HEART CATHETERIZATION WITH CORONARY ANGIOGRAM N/A 10/31/2014   Procedure: LEFT HEART CATHETERIZATION WITH CORONARY ANGIOGRAM;  Surgeon: Sherren Mocha, MD;  Location: [facility] CATH LAB;  Service: Cardiovascular;  Laterality: N/A;  . PTCA     Hx of it.   Marland Kitchen SIGMOIDOSCOPY    . TEE WITHOUT CARDIOVERSION N/A 11/03/2014   Procedure: TRANSESOPHAGEAL ECHOCARDIOGRAM (TEE);  Surgeon: Ivin Poot, MD;  Location: [place];  Service: Open Heart Surgery;  Laterality: N/A;  . TOTAL KNEE ARTHROPLASTY  02/2011   Right knee  . TOTAL KNEE ARTHROPLASTY Left 04/11/2016   Procedure: LEFT TOTAL KNEE ARTHROPLASTY;  Surgeon: Vickey Huger, MD;  Location: [facility];  Service: Orthopedics;  Laterality: Left;    Current Outpatient Prescriptions  Medication Sig Dispense Refill  . acetaminophen (TYLENOL) 500 MG tablet Take 500-1,000 mg by mouth 2 (two) times daily as needed (for pain).     Marland Kitchen clopidogrel (PLAVIX) 75 MG tablet TAKE ONE TABLET BY MOUTH ONCE DAILY (Patient taking differently: Take 75 mg by mouth in the morning) 90 tablet 2  .  isosorbide mononitrate (IMDUR) 30 MG 24 hr tablet Take 1 tablet (30 mg total) by mouth daily. Please keep 10/07/16 appt for further refills. 90 tablet 0  . lisinopril (PRINIVIL,ZESTRIL) 10 MG tablet Take 1 tablet (10 mg total) by mouth daily. 90 tablet 3  . metoprolol tartrate (LOPRESSOR) 25 MG tablet TAKE ONE AND ONE-HALF TABLETS BY MOUTH TWICE A DAY 270 tablet 2  . nitroGLYCERIN (NITROSTAT) 0.4 MG SL tablet Place 0.4 mg under the  tongue every 5 (five) minutes as needed for chest pain.    . pantoprazole (PROTONIX) 40 MG tablet Take 1 tablet (40 mg total) by mouth daily. 30 tablet 11  . rosuvastatin (CRESTOR) 5 MG tablet Take 1 tablet (5 mg total) by mouth daily. 90 tablet 3   No current facility-administered medications for this visit.     Allergies  Allergen Reactions  . Protamine Rash    Hypotension, Rash, unstable vitals.    . Statins Other (See Comments)    Caused soreness (Lipitor, Zocor)  . Rosuvastatin Calcium Other (See Comments)    Caused soreness (Lipitor, Zocor)  . Imodium [Loperamide] Itching and Rash    Social History   Social History  . Marital status: Married    Spouse name: N/A  . Number of children: N/A  . Years of education: N/A   Occupational History  . Not on file.   Social History Main Topics  . Smoking status: Former Research scientist (life sciences)  . Smokeless tobacco: Former Systems developer     Comment: quit in 1985. smoked 1ppd for 35 years   . Alcohol use No  . Drug use: No  . Sexual activity: No   Other Topics Concern  . Not on file   Social History Narrative   Married with children. 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 10:20 am    Has living will,  HCPOA: jerrie Gossett, full code (reviewed 77)    Family History  Problem Relation Age of Onset  . Heart disease Father   . Heart attack Father   . Heart failure Mother   . COPD Mother   . Heart disease Sister   . Heart attack Sister   . Heart attack Brother     Review of Systems:  As stated in the HPI and otherwise negative.   BP (!) 160/84   Pulse 66   Ht 5' 6.5" (1.689 m)   Wt 192 lb 1.6 oz (87.1 kg)   SpO2 99%   BMI 30.54 kg/m   Physical Examination: General: Well developed, well nourished, NAD  HEENT: OP clear, mucus membranes moist  SKIN: warm, dry. No rashes. Neuro: No focal deficits  Musculoskeletal: Muscle strength 5/5 all ext  Psychiatric: Mood and affect normal  Neck: No JVD, no  carotid bruits, no thyromegaly, no lymphadenopathy.  Lungs:Clear bilaterally, no wheezes, rhonci, crackles Cardiovascular: Regular rate and rhythm with ectopy. No murmurs, gallops or rubs. Abdomen:Soft. Bowel sounds present. Non-tender.  Extremities: No lower extremity edema. Pulses are 2 + in the bilateral DP/PT.  Echo April 2017: Left ventricle: The cavity size was normal. There was moderate   concentric hypertrophy. Systolic function was normal. The   estimated ejection fraction was in the range of 60% to 65%. Wall   motion was normal; there were no regional wall motion   abnormalities. Features are consistent with a pseudonormal left   ventricular filling pattern, with concomitant abnormal relaxation   and increased filling pressure (grade 2 diastolic dysfunction). -  Left atrium: The atrium was severely dilated. - Right ventricle: The cavity size was mildly dilated. Wall   thickness was normal. - Right atrium: The atrium was mildly dilated.  EKG:  EKG is  ordered today. The ekg ordered today demonstrates   Recent Labs: 01/21/2016: TSH 2.07 04/23/2016: Magnesium 2.1 05/13/2016: Hemoglobin 11.4; Platelets 322.0 06/28/2016: BUN 17; Creat 0.86; Potassium 3.9; Sodium 143 09/22/2016: ALT 15   Lipid Panel    Component Value Date/Time   CHOL 122 09/22/2016 0912   TRIG 98 09/22/2016 0912   HDL 39 (L) 09/22/2016 0912   CHOLHDL 3.1 09/22/2016 0912   CHOLHDL 5.9 (H) 06/28/2016 0905   VLDL 23 06/28/2016 0905   LDLCALC 63 09/22/2016 0912     Wt Readings from Last 3 Encounters:  10/07/16 192 lb 1.6 oz (87.1 kg)  05/03/16 178 lb 4 oz (80.9 kg)  04/22/16 194 lb 7.1 oz (88.2 kg)     Other studies Reviewed: Additional studies/ records that were reviewed today include: . Review of the above records demonstrates:    Assessment and Plan:   1. CAD without angina: He is s/p CABG in 2016 and doing well. He is having no chest pain suggestive of angina. Will continue beta blocker, Ace  inh, Plavix.    2. HTN: BP is elevated today and at home. I increased his Lisinopril at the last visit. He will follow BP at home for the next two weeks and call back. If still elevated, will increase Lisinopril to 20 mg daily.  3. Hyperlipidemia: LDL is at goal of 70. Will continue statin.    Current medicines are reviewed at length with the patient today.  The patient does not have concerns regarding medicines.  The following changes have been made:  no change  Labs/ tests ordered today include:   No orders of the defined types were placed in this encounter.   Disposition:   FU with me in 6 months  Signed, Lauree Chandler, MD 10/07/2016 3:52 PM    [facility] [address], [place], [place]  [address] Phone: [phone]; Fax: [phone]

## 2016-10-07 NOTE — Patient Instructions (Signed)

## 2016-10-11 ENCOUNTER — Ambulatory Visit (INDEPENDENT_AMBULATORY_CARE_PROVIDER_SITE_OTHER): Payer: Medicare HMO | Admitting: Family Medicine

## 2016-10-11 ENCOUNTER — Encounter: Payer: Self-pay | Admitting: Family Medicine

## 2016-10-11 DIAGNOSIS — R1013 Epigastric pain: Secondary | ICD-10-CM

## 2016-10-11 LAB — COMPREHENSIVE METABOLIC PANEL
ALBUMIN: 4.3 g/dL (ref 3.5–5.2)
ALK PHOS: 51 U/L (ref 39–117)
ALT: 18 U/L (ref 0–53)
AST: 20 U/L (ref 0–37)
BUN: 19 mg/dL (ref 6–23)
CO2: 26 mEq/L (ref 19–32)
CREATININE: 0.93 mg/dL (ref 0.40–1.50)
Calcium: 9.2 mg/dL (ref 8.4–10.5)
Chloride: 107 mEq/L (ref 96–112)
GFR: 83.13 mL/min (ref >60.00)
GLUCOSE: 108 mg/dL — AB (ref 70–99)
Potassium: 3.9 mEq/L (ref 3.5–5.1)
SODIUM: 139 meq/L (ref 135–145)
TOTAL PROTEIN: 7.2 g/dL (ref 6.0–8.3)
Total Bilirubin: 0.6 mg/dL (ref 0.2–1.2)

## 2016-10-11 LAB — CBC WITH DIFFERENTIAL/PLATELET
BASOS ABS: 0.1 10*3/uL (ref 0.0–0.1)
Basophils Relative: 0.8 % (ref 0.0–3.0)
EOS ABS: 0.2 10*3/uL (ref 0.0–0.7)
EOS PCT: 1.9 % (ref 0.0–5.0)
HCT: 37.4 % — ABNORMAL LOW (ref 39.0–52.0)
HEMOGLOBIN: 12.4 g/dL — AB (ref 13.0–17.0)
LYMPHS ABS: 2.4 10*3/uL (ref 0.7–4.0)
Lymphocytes Relative: 26.3 % (ref 12.0–46.0)
MCHC: 33.3 g/dL (ref 30.0–36.0)
MCV: 83.1 fl (ref 78.0–100.0)
MONO ABS: 1.5 10*3/uL — AB (ref 0.1–1.0)
Monocytes Relative: 16.9 % — ABNORMAL HIGH (ref 3.0–12.0)
NEUTROS PCT: 54.1 % (ref 43.0–77.0)
Neutro Abs: 4.8 10*3/uL (ref 1.4–7.7)
Platelets: 240 10*3/uL (ref 150.0–400.0)
RBC: 4.5 Mil/uL (ref 4.22–5.81)
RDW: 17.5 % — ABNORMAL HIGH (ref 11.5–15.5)
WBC: 9 10*3/uL (ref 4.0–10.5)

## 2016-10-11 LAB — LIPASE: LIPASE: 14 U/L (ref 11.0–59.0)

## 2016-10-11 LAB — H. PYLORI ANTIBODY, IGG: H Pylori IgG: NEGATIVE

## 2016-10-11 MED ORDER — SUCRALFATE 1 GM/10ML PO SUSP: 1.0000 g | Freq: Three times a day (TID) | ORAL | 0 refills | Status: DC

## 2016-10-11 NOTE — Progress Notes (Signed)
   Subjective:    Patient ID: [name], male    DOB: 1934/12/19, 82 y.o.   MRN: [id number]  Abdominal Pain  This is a new problem. The current episode started in the past 7 days. The onset quality is gradual. The problem occurs constantly. The problem has been gradually worsening (keeping him up at night). The pain is located in the epigastric region. The quality of the pain is burning. The abdominal pain radiates to the LUQ and back. Associated symptoms include anorexia and belching. Pertinent negatives include no constipation, diarrhea, dysuria, fever, hematochezia, melena, nausea or vomiting. Associated symptoms comments: Keeping him up at night.. The pain is aggravated by eating and belching. Relieved by: no change with movement. He has tried proton pump inhibitors (tried adding gavascon last night) for the symptoms.    Changed to pantoprazole as other PPI interacted with plavix in 2016.  He does occ take Goody powder..but only every three months. No NSAIDs.  No ETOH.    Social History /Family History/Past Medical History reviewed and updated if needed. Treated for E.coli bacteremia and sepsis in 04/2016. S/P CABG Hx of hiatal hernia, GERD.. Treated with prevacid.  Last OV with cards.. Stable check up.   Changed to Blood pressure (!) 110/58, pulse (!) 57, temperature 97.6 F (36.4 C), temperature source Oral, height 5' 6.5" (1.689 m), weight 188 lb 12 oz (85.6 kg).  Review of Systems  Constitutional: Negative for fever.  Gastrointestinal: Positive for abdominal pain and anorexia. Negative for constipation, diarrhea, hematochezia, melena, nausea and vomiting.  Genitourinary: Negative for dysuria.       Objective:   Physical Exam  Constitutional: Vital signs are normal. He appears well-developed and well-nourished.  HENT:  Head: Normocephalic.  Right Ear: Hearing normal.  Left Ear: Hearing normal.  Nose: Nose normal.  Mouth/Throat: Oropharynx is clear and moist and mucous  membranes are normal.  Neck: Trachea normal. Carotid bruit is not present. No thyroid mass and no thyromegaly present.  Cardiovascular: Normal rate, regular rhythm and normal pulses.  Exam reveals no gallop, no distant heart sounds and no friction rub.   No murmur heard. No peripheral edema  Pulmonary/Chest: Effort normal and breath sounds normal. No respiratory distress.  Abdominal: There is tenderness in the epigastric area. There is no rigidity, no rebound, no guarding and no CVA tenderness. No hernia.  Skin: Skin is warm, dry and intact. No rash noted.  Psychiatric: He has a normal mood and affect. His speech is normal and behavior is normal. Thought content normal.          Assessment & Plan:

## 2016-10-11 NOTE — Addendum Note (Signed)
Addended by: Marchia Bond on: 10/11/2016 02:39 PM   Modules accepted: Orders

## 2016-10-11 NOTE — Progress Notes (Signed)
Pre visit review using our clinic review tool, if applicable. No additional management support is needed unless otherwise documented below in the visit note. 

## 2016-10-11 NOTE — Patient Instructions (Signed)
Consider change to Rabeprazole which appears to also be a lower risk alternative.  For now continue pantoprazole daily.  Start by adding pepcid AC twice daily.   Can try carafate for breakthrough symptoms. Avoid  acidic foods.  If not improving in 1-2 weeks call for further recommendaitons.

## 2016-10-11 NOTE — Assessment & Plan Note (Signed)
Gastritis vs PUD vs pancreatitis. Not suggestive of CVD. Eval with labs including CMET, lipase and cbc. Eval with Hpylori test.  He feels pantoprazole has not been helping since changed to this for interaction with  plavix.  Consider change to Rabeprazole which per uptodate appears to also be a lower risk alternative.  Start by adding pepcid AC twice daily.   Can try carafate for breakthrough symptoms. Avoid  Acidic foods.  if not improving in 2 weeks call for further recommendaitons.

## 2016-10-26 ENCOUNTER — Other Ambulatory Visit: Payer: Self-pay | Admitting: Cardiovascular Disease

## 2016-11-08 ENCOUNTER — Other Ambulatory Visit: Payer: Self-pay | Admitting: Physician Assistant

## 2016-11-09 NOTE — Telephone Encounter (Signed)
Please review for refill, Thanks !  

## 2016-11-09 NOTE — Telephone Encounter (Signed)
Okay to refill? Please advise. Thanks, MI 

## 2016-11-09 NOTE — Telephone Encounter (Signed)
Please send to primary care. Pt recently saw primary care for epigastric pain

## 2016-11-13 ENCOUNTER — Other Ambulatory Visit: Payer: Self-pay | Admitting: Physician Assistant

## 2016-11-14 NOTE — Telephone Encounter (Signed)
Refill Request.  

## 2016-11-15 ENCOUNTER — Other Ambulatory Visit: Payer: Self-pay

## 2016-11-15 NOTE — Telephone Encounter (Signed)
Pt left v/m requesting refill for med not prescribed by Dr Diona Browner; pt did not leave name of med. Left v/m requesting pt to cb.

## 2016-11-16 MED ORDER — PANTOPRAZOLE SODIUM 40 MG PO TBEC: 40.0000 mg | DELAYED_RELEASE_TABLET | Freq: Every day | ORAL | 3 refills | Status: DC

## 2016-11-16 NOTE — Telephone Encounter (Signed)
Pt not at home but left message for pt to ck with [facility] [address] because refill done.

## 2016-11-16 NOTE — Telephone Encounter (Signed)
Pt request refill pantoprazole; cardiology previously filled but on 10/11/16 pt was advised to continue pantoprazole for now; pt has f/u appt on 11/22/16 but is out of pantoprazole. [facility] [address].

## 2016-11-22 ENCOUNTER — Encounter: Payer: Self-pay | Admitting: Family Medicine

## 2016-11-22 ENCOUNTER — Ambulatory Visit (INDEPENDENT_AMBULATORY_CARE_PROVIDER_SITE_OTHER): Payer: Medicare HMO | Admitting: Family Medicine

## 2016-11-22 DIAGNOSIS — R1013 Epigastric pain: Secondary | ICD-10-CM

## 2016-11-22 NOTE — Assessment & Plan Note (Signed)
Likely  GERD, gastritis.  Restart pepcid AC along with pantoprazole.. If not improving refer to GI for further eval.

## 2016-11-22 NOTE — Progress Notes (Signed)
   Subjective:    Patient ID: [name], male    DOB: 02-Dec-1934, 82 y.o.   MRN: [id number]  HPI  82 year old male presents for follow up of epigastric abdominal pain.   Summary of 10/11/2016 OV recommendations are as follows: Gastritis vs PUD vs pancreatitis. Not suggestive of CVD. Eval with labs including CMET, lipase and cbc. Eval with Hpylori test.  He feels pantoprazole has not been helping since changed to this for interaction with  plavix.  Consider change to Rabeprazole which per uptodate appears to also be a lower risk alternative.  Start by adding pepcid AC twice daily.   Can try carafate for breakthrough symptoms. Avoid  Acidic foods.  Lab eval showed neg Hpylori, nml CMET, nml lipase, slight anemia  Also had ECHO per Cards on 4/6 nml EF, but grade 2 diastolic dysfunction due to elevated BP. Now on imdur.  11/22/16 Today pt reports carafate helps decrease stomach upset. He still takes pantoprazole. He started on pepcid [facility] but has stopped now... It did help but has not refilled yet.  He is still having daily chest pain, epigastric pain.    Blood pressure 130/70, pulse 70, temperature 98.3 F (36.8 C), temperature source Oral, height 5' 6.5" (1.689 m), weight 192 lb (87.1 kg).  Review of Systems  Constitutional: Negative for fatigue.  HENT: Negative for ear pain.   Eyes: Negative for pain.  Respiratory: Negative for shortness of breath.   Cardiovascular: Negative for chest pain and leg swelling.       Objective:   Physical Exam  Constitutional: Vital signs are normal. He appears well-developed and well-nourished.  HENT:  Head: Normocephalic.  Right Ear: Hearing normal.  Left Ear: Hearing normal.  Nose: Nose normal.  Mouth/Throat: Oropharynx is clear and moist and mucous membranes are normal.  Neck: Trachea normal. Carotid bruit is not present. No thyroid mass and no thyromegaly present.  Cardiovascular: Normal rate, regular rhythm and normal pulses.  Exam  reveals no gallop, no distant heart sounds and no friction rub.   No murmur heard. No peripheral edema  Pulmonary/Chest: Effort normal and breath sounds normal. No respiratory distress.  Skin: Skin is warm, dry and intact. No rash noted.  Psychiatric: He has a normal mood and affect. His speech is normal and behavior is normal. Thought content normal.          Assessment & Plan:

## 2016-11-22 NOTE — Patient Instructions (Signed)
Continue pantoprazole, add Pepcid AC twice daily.  Decrease acidic foods citrus, tomatoes, caffeine, greasy food  in diet.  Call in 2-4 weeks if you have continue symptoms... For a referral to GI.

## 2016-12-27 ENCOUNTER — Telehealth: Payer: Self-pay

## 2016-12-27 NOTE — Telephone Encounter (Signed)
Left pt message asking to call Allison back directly at [phone] to schedule AWV + labs with Lesia and CPE with PCP. °

## 2016-12-27 NOTE — Telephone Encounter (Signed)
NOTE* Please schedule after 01/20/17

## 2016-12-27 NOTE — Telephone Encounter (Signed)
Patient is on the list for [employer] 2018 and may be a good candidate for an AWV. Please let me know if/when appt is scheduled.   

## 2017-01-12 ENCOUNTER — Other Ambulatory Visit: Payer: Self-pay | Admitting: Cardiovascular Disease

## 2017-01-12 MED ORDER — ISOSORBIDE MONONITRATE ER 30 MG PO TB24: 30.0000 mg | ORAL_TABLET | Freq: Every day | ORAL | 2 refills | Status: DC

## 2017-02-03 NOTE — Telephone Encounter (Signed)
Scheduled 03/03/17

## 2017-03-02 ENCOUNTER — Telehealth: Payer: Self-pay | Admitting: Family Medicine

## 2017-03-02 DIAGNOSIS — E1149 Type 2 diabetes mellitus with other diabetic neurological complication: Secondary | ICD-10-CM

## 2017-03-02 DIAGNOSIS — I2510 Atherosclerotic heart disease of native coronary artery without angina pectoris: Secondary | ICD-10-CM

## 2017-03-02 DIAGNOSIS — D649 Anemia, unspecified: Secondary | ICD-10-CM

## 2017-03-02 NOTE — Telephone Encounter (Signed)
-----   Message from Eustace Pen, LPN sent at 09/01/7979  3:13 PM EDT ----- Regarding: Labs 8/24 Please place lab orders.  [employer]

## 2017-03-03 ENCOUNTER — Ambulatory Visit (INDEPENDENT_AMBULATORY_CARE_PROVIDER_SITE_OTHER): Payer: Medicare HMO

## 2017-03-03 VITALS — BP 140/82 | HR 66 | Temp 97.6°F | Ht 66.5 in | Wt 181.8 lb

## 2017-03-03 DIAGNOSIS — Z0000 Encounter for general adult medical examination without abnormal findings: Secondary | ICD-10-CM

## 2017-03-03 DIAGNOSIS — D649 Anemia, unspecified: Secondary | ICD-10-CM

## 2017-03-03 DIAGNOSIS — E1149 Type 2 diabetes mellitus with other diabetic neurological complication: Secondary | ICD-10-CM

## 2017-03-03 DIAGNOSIS — I2510 Atherosclerotic heart disease of native coronary artery without angina pectoris: Secondary | ICD-10-CM

## 2017-03-03 LAB — CBC WITH DIFFERENTIAL/PLATELET
BASOS ABS: 0 10*3/uL (ref 0.0–0.1)
Basophils Relative: 0.6 % (ref 0.0–3.0)
Eosinophils Absolute: 0.4 10*3/uL (ref 0.0–0.7)
Eosinophils Relative: 5.5 % — ABNORMAL HIGH (ref 0.0–5.0)
HEMATOCRIT: 40 % (ref 39.0–52.0)
Hemoglobin: 13.2 g/dL (ref 13.0–17.0)
LYMPHS PCT: 29 % (ref 12.0–46.0)
Lymphs Abs: 2 10*3/uL (ref 0.7–4.0)
MCHC: 33 g/dL (ref 30.0–36.0)
MCV: 88.1 fl (ref 78.0–100.0)
MONO ABS: 0.8 10*3/uL (ref 0.1–1.0)
Monocytes Relative: 11.1 % (ref 3.0–12.0)
NEUTROS ABS: 3.7 10*3/uL (ref 1.4–7.7)
Neutrophils Relative %: 53.8 % (ref 43.0–77.0)
Platelets: 230 10*3/uL (ref 150.0–400.0)
RBC: 4.54 Mil/uL (ref 4.22–5.81)
RDW: 14.6 % (ref 11.5–15.5)
WBC: 7 10*3/uL (ref 4.0–10.5)

## 2017-03-03 LAB — COMPREHENSIVE METABOLIC PANEL
ALBUMIN: 4.1 g/dL (ref 3.5–5.2)
ALK PHOS: 48 U/L (ref 39–117)
ALT: 16 U/L (ref 0–53)
AST: 18 U/L (ref 0–37)
BILIRUBIN TOTAL: 0.6 mg/dL (ref 0.2–1.2)
BUN: 18 mg/dL (ref 6–23)
CALCIUM: 9.2 mg/dL (ref 8.4–10.5)
CHLORIDE: 106 meq/L (ref 96–112)
CO2: 28 mEq/L (ref 19–32)
CREATININE: 0.81 mg/dL (ref 0.40–1.50)
GFR: 97.41 mL/min (ref >60.00)
Glucose, Bld: 117 mg/dL — ABNORMAL HIGH (ref 70–99)
Potassium: 3.7 mEq/L (ref 3.5–5.1)
Sodium: 141 mEq/L (ref 135–145)
TOTAL PROTEIN: 6.9 g/dL (ref 6.0–8.3)

## 2017-03-03 LAB — LIPID PANEL
CHOLESTEROL: 91 mg/dL (ref 0–200)
HDL: 26.2 mg/dL — ABNORMAL LOW (ref >39.00)
LDL Cholesterol: 47 mg/dL (ref 0–99)
NonHDL: 64.88
TRIGLYCERIDES: 91 mg/dL (ref 0.0–149.0)
Total CHOL/HDL Ratio: 3
VLDL: 18.2 mg/dL (ref 0.0–40.0)

## 2017-03-03 LAB — HEMOGLOBIN A1C: HEMOGLOBIN A1C: 6.8 % — AB (ref 4.6–6.5)

## 2017-03-03 NOTE — Progress Notes (Signed)
Subjective:   [name] is a 82 y.o. male who presents for Medicare Annual/Subsequent preventive examination.  Review of Systems:  N/A Cardiac Risk Factors include: advanced age (>3men, >58 women);male gender;dyslipidemia;hypertension     Objective:    Vitals: BP 140/82 (BP Location: Right Arm, Patient Position: Sitting, Cuff Size: Normal)   Pulse 66   Temp 97.6 F (36.4 C) (Oral)   Ht 5' 6.5" (1.689 m) Comment: no shoes  Wt 181 lb 12 oz (82.4 kg)   SpO2 96%   BMI 28.90 kg/m   Body mass index is 28.9 kg/m.  Tobacco History  Smoking Status  . Former Smoker  Smokeless Tobacco  . Former Systems developer    Comment: quit in 1985. smoked 1ppd for 35 years      Counseling given: No   Past Medical History:  Diagnosis Date  . Coronary atherosclerosis of unspecified type of vessel, native or graft    s/p stent mid LAD 2005  . Depression   . DJD (degenerative joint disease)   . GERD (gastroesophageal reflux disease)   . History of hiatal hernia   . HTN (hypertension)   . Hyperlipidemia   . Pre-diabetes    Past Surgical History:  Procedure Laterality Date  . BRAIN SURGERY    . CARDIAC CATHETERIZATION    . CORONARY ARTERY BYPASS GRAFT N/A 11/03/2014   Procedure: CORONARY ARTERY BYPASS GRAFTING (CABG), ON PUMP, TIMES FOUR, USING LEFT INTERNAL MAMMARY, RIGHT GREATER SAPHENOUS VEIN HARVESTED ENDOSCOPICALLY;  Surgeon: Ivin Poot, MD;  Location: [facility];  Service: Open Heart Surgery;  Laterality: N/A;  LIMA-LAD; SVG-DIAG; SVG-OM; SVG-RCA  . CRANIOTOMY N/A 01/18/2014   Procedure: CRANIOTOMY HEMATOMA EVACUATION SUBDURAL;  Surgeon: Hosie Spangle, MD;  Location: [place];  Service: Neurosurgery;  Laterality: N/A;  . kidney stone removal    . KNEE SURGERY    . LEFT HEART CATHETERIZATION WITH CORONARY ANGIOGRAM N/A 07/27/2011   Procedure: LEFT HEART CATHETERIZATION WITH CORONARY ANGIOGRAM;  Surgeon: Burnell Blanks, MD;  Location: [facility] CATH LAB;  Service: Cardiovascular;   Laterality: N/A;  . LEFT HEART CATHETERIZATION WITH CORONARY ANGIOGRAM N/A 10/31/2014   Procedure: LEFT HEART CATHETERIZATION WITH CORONARY ANGIOGRAM;  Surgeon: Sherren Mocha, MD;  Location: [facility] CATH LAB;  Service: Cardiovascular;  Laterality: N/A;  . PTCA     Hx of it.   Marland Kitchen SIGMOIDOSCOPY    . TEE WITHOUT CARDIOVERSION N/A 11/03/2014   Procedure: TRANSESOPHAGEAL ECHOCARDIOGRAM (TEE);  Surgeon: Ivin Poot, MD;  Location: [place];  Service: Open Heart Surgery;  Laterality: N/A;  . TOTAL KNEE ARTHROPLASTY  02/2011   Right knee  . TOTAL KNEE ARTHROPLASTY Left 04/11/2016   Procedure: LEFT TOTAL KNEE ARTHROPLASTY;  Surgeon: Vickey Huger, MD;  Location: [place];  Service: Orthopedics;  Laterality: Left;   Family History  Problem Relation Age of Onset  . Heart disease Father   . Heart attack Father   . Heart failure Mother   . COPD Mother   . Heart disease Sister   . Heart attack Sister   . Heart attack Brother    History  Sexual Activity  . Sexual activity: No    Outpatient Encounter Prescriptions as of 03/03/2017  Medication Sig  . acetaminophen (TYLENOL) 500 MG tablet Take 500-1,000 mg by mouth 2 (two) times daily as needed (for pain).   Marland Kitchen clopidogrel (PLAVIX) 75 MG tablet TAKE ONE TABLET BY MOUTH ONCE DAILY  . isosorbide mononitrate (IMDUR) 30 MG 24 hr tablet Take  1 tablet (30 mg total) by mouth daily.  Marland Kitchen lisinopril (PRINIVIL,ZESTRIL) 10 MG tablet Take 1 tablet (10 mg total) by mouth daily.  . metoprolol tartrate (LOPRESSOR) 25 MG tablet TAKE ONE AND ONE-HALF TABLETS BY MOUTH TWICE A DAY  . nitroGLYCERIN (NITROSTAT) 0.4 MG SL tablet Place 0.4 mg under the tongue every 5 (five) minutes as needed for chest pain.  . pantoprazole (PROTONIX) 40 MG tablet Take 1 tablet (40 mg total) by mouth daily.  . [DISCONTINUED] sucralfate (CARAFATE) 1 GM/10ML suspension Take 10 mLs (1 g total) by mouth 4 (four) times daily -  with meals and at bedtime.  . rosuvastatin (CRESTOR) 5 MG tablet Take 1 tablet (5  mg total) by mouth daily.   No facility-administered encounter medications on file as of 03/03/2017.     Activities of Daily Living In your present state of health, do you have any difficulty performing the following activities: 03/03/2017 04/20/2016  Hearing? Y N  Vision? N N  Difficulty concentrating or making decisions? N N  Walking or climbing stairs? Y Y  Dressing or bathing? N N  Doing errands, shopping? N N  Preparing Food and eating ? N -  Using the Toilet? N -  In the past six months, have you accidently leaked urine? N -  Do you have problems with loss of bowel control? N -  Managing your Medications? N -  Managing your Finances? N -  Housekeeping or managing your Housekeeping? N -  Some recent data might be hidden    Patient Care Team: Jinny Sanders, MD as PCP - General (Family Medicine) Rutherford Guys, MD as Consulting Physician (Ophthalmology)   Assessment:    Hearing Screening Comments: Bilateral hearing aids Vision Screening Comments: Last vision exam in Feb 2018 with Dr. Gershon Crane  Exercise Activities and Dietary recommendations Current Exercise Habits: The patient does not participate in regular exercise at present, Exercise limited by: None identified  Goals    . Increase physical activity          Starting 03/03/2017, I will resume walking for 30 min 3 days per week.        Fall Risk Fall Risk  03/03/2017 01/21/2016 12/06/2013  Falls in the past year? No No Yes  Number falls in past yr: - - 2 or more  Risk Factor Category  - - High Fall Risk  Risk for fall due to : - - Impaired balance/gait   Depression Screen PHQ 2/9 Scores 03/03/2017 01/21/2016 12/06/2013  PHQ - 2 Score 0 0 0  PHQ- 9 Score 1 - -    Cognitive Function MMSE - Mini Mental State Exam 03/03/2017 01/21/2016  Orientation to time 5 5  Orientation to Place 5 5  Registration 3 3  Attention/ Calculation 0 0  Recall 3 3  Language- name 2 objects 0 0  Language- repeat 1 1  Language- follow  3 step command 3 3  Language- read & follow direction 0 0  Write a sentence 0 0  Copy design 0 0  Total score 20 20       PLEASE NOTE: A Mini-Cog screen was completed. Maximum score is 20. A value of 0 denotes this part of Folstein MMSE was not completed or the patient failed this part of the Mini-Cog screening.   Mini-Cog Screening Orientation to Time - Max 5 pts Orientation to Place - Max 5 pts Registration - Max 3 pts Recall - Max 3 pts Language Repeat - Max  1 pts Language Follow 3 Step Command - Max 3 pts   Immunization History  Administered Date(s) Administered  . Pneumococcal Conjugate-13 01/21/2016  . Pneumococcal Polysaccharide-23 06/19/2013   Screening Tests Health Maintenance  Topic Date Due  . FOOT EXAM  03/10/2017 (Originally 01/21/2017)  . INFLUENZA VACCINE  10/08/2017 (Originally 02/08/2017)  . TETANUS/TDAP  03/04/2027 (Originally 12/25/1953)  . OPHTHALMOLOGY EXAM  08/25/2017  . HEMOGLOBIN A1C  09/03/2017  . PNA vac Low Risk Adult  Completed      Plan:     I have personally reviewed and addressed the Medicare Annual Wellness questionnaire and have noted the following in the patient's chart:  A. Medical and social history B. Use of alcohol, tobacco or illicit drugs  C. Current medications and supplements D. Functional ability and status E.  Nutritional status F.  Physical activity G. Advance directives H. List of other physicians I.  Hospitalizations, surgeries, and ER visits in previous 12 months J.  [place] to include hearing, vision, cognitive, depression L. Referrals and appointments - none  In addition, I have reviewed and discussed with patient certain preventive protocols, quality metrics, and best practice recommendations. A written personalized care plan for preventive services as well as general preventive health recommendations were provided to patient.  See attached scanned questionnaire for additional information.   Signed,    Lindell Noe, MHA, BS, LPN Health Coach

## 2017-03-03 NOTE — Progress Notes (Signed)
PCP notes:   Health maintenance:  Foot exam - PCP please address at next appt Flu vaccine - addressed Tetanus - postponed/insurance Eye exam - per patient, exam in Feb 2018 A1C - completed  Abnormal screenings:   Depression score: 1  Patient concerns:   Patient reports sensation of "dried mud" on both feet.  Nurse concerns:  None  Next PCP appt:   03/10/17 @ 1400

## 2017-03-03 NOTE — Progress Notes (Signed)
I reviewed health advisor's note, was available for consultation, and agree with documentation and plan.  

## 2017-03-03 NOTE — Patient Instructions (Signed)
[name] , Thank you for taking time to come for your Medicare Wellness Visit. I appreciate your ongoing commitment to your health goals. Please review the following plan we discussed and let me know if I can assist you in the future.   These are the goals we discussed: Goals    . Increase physical activity          Starting 03/03/2017, I will resume walking for 30 min 3 days per week.         This is a list of the screening recommended for you and due dates:  Health Maintenance  Topic Date Due  . Complete foot exam   03/10/2017*  . Flu Shot  10/08/2017*  . Tetanus Vaccine  03/04/2027*  . Eye exam for diabetics  08/25/2017  . Hemoglobin A1C  09/03/2017  . Pneumonia vaccines  Completed  *Topic was postponed. The date shown is not the original due date.   Preventive Care for Adults  A healthy lifestyle and preventive care can promote health and wellness. Preventive health guidelines for adults include the following key practices.  . A routine yearly physical is a good way to check with your health care provider about your health and preventive screening. It is a chance to share any concerns and updates on your health and to receive a thorough exam.  . Visit your dentist for a routine exam and preventive care every 6 months. Brush your teeth twice a day and floss once a day. Good oral hygiene prevents tooth decay and gum disease.  . The frequency of eye exams is based on your age, health, family medical history, use  of contact lenses, and other factors. Follow your health care provider's ecommendations for frequency of eye exams.  . Eat a healthy diet. Foods like vegetables, fruits, whole grains, low-fat dairy products, and lean protein foods contain the nutrients you need without too many calories. Decrease your intake of foods high in solid fats, added sugars, and salt. Eat the right amount of calories for you. Get information about a proper diet from your health care provider,  if necessary.  . Regular physical exercise is one of the most important things you can do for your health. Most adults should get at least 150 minutes of moderate-intensity exercise (any activity that increases your heart rate and causes you to sweat) each week. In addition, most adults need muscle-strengthening exercises on 2 or more days a week.  Silver Sneakers may be a benefit available to you. To determine eligibility, you may visit the website: www.silversneakers.com or contact program at 510-779-2004 Mon-Fri between 8AM-8PM.   . Maintain a healthy weight. The body mass index (BMI) is a screening tool to identify possible weight problems. It provides an estimate of body fat based on height and weight. Your health care provider can find your BMI and can help you achieve or maintain a healthy weight.   For adults 20 years and older: ? A BMI below 18.5 is considered underweight. ? A BMI of 18.5 to 24.9 is normal. ? A BMI of 25 to 29.9 is considered overweight. ? A BMI of 30 and above is considered obese.   . Maintain normal blood lipids and cholesterol levels by exercising and minimizing your intake of saturated fat. Eat a balanced diet with plenty of fruit and vegetables. Blood tests for lipids and cholesterol should begin at age 84 and be repeated every 5 years. If your lipid or cholesterol levels are high, you  are over 65, or you are at high risk for heart disease, you may need your cholesterol levels checked more frequently. Ongoing high lipid and cholesterol levels should be treated with medicines if diet and exercise are not working.  . If you smoke, find out from your health care provider how to quit. If you do not use tobacco, please do not start.  . If you choose to drink alcohol, please do not consume more than 2 drinks per day. One drink is considered to be 12 ounces (355 mL) of beer, 5 ounces (148 mL) of wine, or 1.5 ounces (44 mL) of liquor.  . If you are 23-94 years old, ask  your health care provider if you should take aspirin to prevent strokes.  . Use sunscreen. Apply sunscreen liberally and repeatedly throughout the day. You should seek shade when your shadow is shorter than you. Protect yourself by wearing long sleeves, pants, a wide-brimmed hat, and sunglasses year round, whenever you are outdoors.  . Once a month, do a whole body skin exam, using a mirror to look at the skin on your back. Tell your health care provider of new moles, moles that have irregular borders, moles that are larger than a pencil eraser, or moles that have changed in shape or color.

## 2017-03-10 ENCOUNTER — Ambulatory Visit (INDEPENDENT_AMBULATORY_CARE_PROVIDER_SITE_OTHER): Payer: Medicare HMO | Admitting: Family Medicine

## 2017-03-10 ENCOUNTER — Encounter: Payer: Self-pay | Admitting: Family Medicine

## 2017-03-10 VITALS — BP 124/64 | HR 60 | Temp 97.7°F | Ht 66.5 in | Wt 186.2 lb

## 2017-03-10 DIAGNOSIS — I10 Essential (primary) hypertension: Secondary | ICD-10-CM

## 2017-03-10 DIAGNOSIS — E1142 Type 2 diabetes mellitus with diabetic polyneuropathy: Secondary | ICD-10-CM | POA: Insufficient documentation

## 2017-03-10 DIAGNOSIS — E1140 Type 2 diabetes mellitus with diabetic neuropathy, unspecified: Secondary | ICD-10-CM | POA: Diagnosis not present

## 2017-03-10 DIAGNOSIS — Z0001 Encounter for general adult medical examination with abnormal findings: Secondary | ICD-10-CM

## 2017-03-10 DIAGNOSIS — I2510 Atherosclerotic heart disease of native coronary artery without angina pectoris: Secondary | ICD-10-CM | POA: Diagnosis not present

## 2017-03-10 DIAGNOSIS — Z0000 Encounter for general adult medical examination without abnormal findings: Secondary | ICD-10-CM

## 2017-03-10 DIAGNOSIS — E782 Mixed hyperlipidemia: Secondary | ICD-10-CM

## 2017-03-10 DIAGNOSIS — E119 Type 2 diabetes mellitus without complications: Secondary | ICD-10-CM

## 2017-03-10 HISTORY — DX: Type 2 diabetes mellitus with diabetic neuropathy, unspecified: E11.40

## 2017-03-10 HISTORY — DX: Type 2 diabetes mellitus with diabetic polyneuropathy: E11.42

## 2017-03-10 LAB — HM DIABETES FOOT EXAM

## 2017-03-10 NOTE — Assessment & Plan Note (Signed)
Well controlled. Continue current medication.  

## 2017-03-10 NOTE — Assessment & Plan Note (Signed)
Followed by Cardiology 

## 2017-03-10 NOTE — Patient Instructions (Addendum)
Get back to regular exercise, low carb diet and weight loss.  Call if foot symptoms worsen.

## 2017-03-10 NOTE — Assessment & Plan Note (Signed)
Mild symptoms. Not interested in med to treat.

## 2017-03-10 NOTE — Progress Notes (Signed)
Subjective:    Patient ID: [name], male    DOB: Mar 03, 1935, 82 y.o.   MRN: [id number]  HPI  The patient presents for complete physical and review of chronic health problems. He/She also has the following acute concerns today: sensation of "dried mud" on both feet. Does not bother him much, no burning.  The patient saw Candis Musa, LPN for medicare wellness. Note reviewed in detail and important notes copied below.  Health maintenance: Foot exam - PCP please address at next appt Flu vaccine - addressed Tetanus - postponed/insurance Eye exam - per patient, exam in Feb 2018 A1C - completed  Abnormal screenings:  Depression score: 1 Patient concerns:  Patient reports sensation of "dried mud" on both feet.  Diabetes:   Diet controlled Lab Results  Component Value Date   HGBA1C 6.8 (H) 03/03/2017  Using medications without difficulties: Feet problems: non ulcers Blood Sugars averaging: wants to check eye exam within last year: yes  Hypertension:   Good control on lisinopril and metoprolol  Using medication without problems or lightheadedness:  none Chest pain with exertion: none Edema:none Short of breath: none Average home BPs: Other issues:  Elevated Cholesterol:  LDL at  oal on low dose crestor Lab Results  Component Value Date   CHOL 91 03/03/2017   HDL 26.20 (L) 03/03/2017   LDLCALC 47 03/03/2017   TRIG 91.0 03/03/2017   CHOLHDL 3 03/03/2017  Using medications without problems: none Muscle aches: none Diet compliance: moderate Exercise: none Other complaints:  GERD: stable on pantoprazole and pepcid AC.  he does have flatulence with PPI.   Dr. Glennie Hawk Cardiologist for CAD, S/P CABG, angioplasty, on plavix  Dr. Gershon Crane Opthamologist ORTHO Dr. Ronnie Derby 2017 left TKR  Social History /Family History/Past Medical History reviewed in detail and updated in EMR if needed. Blood pressure 124/64, pulse 60, temperature 97.7 F (36.5 C), temperature  source Oral, height 5' 6.5" (1.689 m), weight 186 lb 4 oz (84.5 kg). Body mass index is 29.61 kg/m.  Review of Systems  Constitutional: Negative for fatigue and fever.  HENT: Negative for ear pain.   Eyes: Negative for pain.  Respiratory: Negative for cough and shortness of breath.   Cardiovascular: Negative for chest pain, palpitations and leg swelling.  Gastrointestinal: Negative for abdominal pain.  Genitourinary: Negative for dysuria.  Musculoskeletal: Negative for arthralgias.  Neurological: Negative for syncope, light-headedness and headaches.  Psychiatric/Behavioral: Negative for dysphoric mood.       Objective:   Physical Exam  Constitutional: He appears well-developed and well-nourished.  Non-toxic appearance. He does not appear ill. No distress.  HENT:  Head: Normocephalic and atraumatic.  Right Ear: Hearing, tympanic membrane, external ear and ear canal normal.  Left Ear: Hearing, tympanic membrane, external ear and ear canal normal.  Nose: Nose normal.  Mouth/Throat: Uvula is midline, oropharynx is clear and moist and mucous membranes are normal.  Eyes: Pupils are equal, round, and reactive to light. Conjunctivae, EOM and lids are normal. Lids are everted and swept, no foreign bodies found.  Neck: Trachea normal, normal range of motion and phonation normal. Neck supple. Carotid bruit is not present. No thyroid mass and no thyromegaly present.  Cardiovascular: Normal rate, regular rhythm, S1 normal, S2 normal, intact distal pulses and normal pulses.  Exam reveals no gallop.   No murmur heard. Pulmonary/Chest: Breath sounds normal. He has no wheezes. He has no rhonchi. He has no rales.  Abdominal: Soft. Normal appearance and bowel sounds are normal. There  is no hepatosplenomegaly. There is no tenderness. There is no rebound, no guarding and no CVA tenderness. No hernia.  Lymphadenopathy:    He has no cervical adenopathy.  Neurological: He is alert. He has normal strength  and normal reflexes. No cranial nerve deficit or sensory deficit. Gait normal.  Skin: Skin is warm, dry and intact. No rash noted.  Psychiatric: He has a normal mood and affect. His speech is normal and behavior is normal. Judgment normal.    Diabetic foot exam: Normal inspection No skin breakdown Heel calluses  Normal DP pulses Decreased sensation to light touch and monofilament Nails normal       Assessment & Plan:  The patient's preventative maintenance and recommended screening tests for an annual wellness exam were reviewed in full today. Brought up to date unless services declined.  Counselled on the importance of diet, exercise, and its role in overall health and mortality. The patient's FH and SH was reviewed, including their home life, tobacco status, and drug and alcohol status.   Vaccines: UPTODATE  colonoscopy not indicated  former smoker, remote

## 2017-03-10 NOTE — Assessment & Plan Note (Signed)
Diet controlled. Rx for glucometer and strips given.. Check CBGs daily to every few days.

## 2017-03-11 DIAGNOSIS — R69 Illness, unspecified: Secondary | ICD-10-CM | POA: Diagnosis not present

## 2017-03-17 DIAGNOSIS — R69 Illness, unspecified: Secondary | ICD-10-CM | POA: Diagnosis not present

## 2017-03-30 ENCOUNTER — Other Ambulatory Visit: Payer: Self-pay | Admitting: Cardiovascular Disease

## 2017-04-11 ENCOUNTER — Other Ambulatory Visit: Payer: Self-pay | Admitting: Cardiovascular Disease

## 2017-04-11 NOTE — Telephone Encounter (Signed)
Follow up     *STAT* If patient is at the pharmacy, call can be transferred to refill team.   1. Which medications need to be refilled? (please list name of each medication and dose if known) lisinopril (PRINIVIL,ZESTRIL) 10 MG tablet  2. Which pharmacy/location (including street and city if local pharmacy) is medication to be sent to? [place]  3. Do they need a 30 day or 90 day supply? [place]

## 2017-04-26 ENCOUNTER — Encounter: Payer: Self-pay | Admitting: Cardiovascular Disease

## 2017-04-27 ENCOUNTER — Encounter: Payer: Self-pay | Admitting: Cardiovascular Disease

## 2017-04-27 ENCOUNTER — Ambulatory Visit (INDEPENDENT_AMBULATORY_CARE_PROVIDER_SITE_OTHER): Payer: Medicare HMO | Admitting: Cardiovascular Disease

## 2017-04-27 VITALS — BP 132/70 | HR 56 | Ht 66.5 in | Wt 186.0 lb

## 2017-04-27 DIAGNOSIS — E7800 Pure hypercholesterolemia, unspecified: Secondary | ICD-10-CM

## 2017-04-27 DIAGNOSIS — I10 Essential (primary) hypertension: Secondary | ICD-10-CM | POA: Diagnosis not present

## 2017-04-27 DIAGNOSIS — I2510 Atherosclerotic heart disease of native coronary artery without angina pectoris: Secondary | ICD-10-CM

## 2017-04-27 NOTE — Progress Notes (Signed)
Chief Complaint  Patient presents with  . Follow-up    CAD     History of Present Illness: 82 yo male with history of CAD s/p CABG 2016, prior coronary stent placement before his CABG, HTN, Hyperlipidemia and GERD who presents today for cardiac follow up. He has been followed for CAD since 2005 when he had a LAD stent placed. He has not tolerated statins in the past secondary to myalgias. He was evaluated April 2016 for chest pain. Cardiac catheterization 10/31/14 with multivessel disease with severe 95% distal left main stenosis. He underwent 4V CABG (LIMA-LAD, SVG-DX, SVG-PDA, SVG-OM) April 2016. Echo April 2017 with normal LV function and no significant valvular disease.   He is here today for follow up. The patient denies any chest pain, dyspnea, palpitations, lower extremity edema, orthopnea, PND, dizziness, near syncope or syncope. Some muscle aches on Crestor.   Primary Care Physician: Jinny Sanders, MD   Past Medical History:  Diagnosis Date  . Coronary atherosclerosis of unspecified type of vessel, native or graft    s/p stent mid LAD 2005  . Depression   . DJD (degenerative joint disease)   . GERD (gastroesophageal reflux disease)   . History of hiatal hernia   . HTN (hypertension)   . Hyperlipidemia   . Pre-diabetes     Past Surgical History:  Procedure Laterality Date  . BRAIN SURGERY    . CARDIAC CATHETERIZATION    . CORONARY ARTERY BYPASS GRAFT N/A 11/03/2014   Procedure: CORONARY ARTERY BYPASS GRAFTING (CABG), ON PUMP, TIMES FOUR, USING LEFT INTERNAL MAMMARY, RIGHT GREATER SAPHENOUS VEIN HARVESTED ENDOSCOPICALLY;  Surgeon: Ivin Poot, MD;  Location: [facility];  Service: Open Heart Surgery;  Laterality: N/A;  LIMA-LAD; SVG-DIAG; SVG-OM; SVG-RCA  . CRANIOTOMY N/A 01/18/2014   Procedure: CRANIOTOMY HEMATOMA EVACUATION SUBDURAL;  Surgeon: Hosie Spangle, MD;  Location: [facility];  Service: Neurosurgery;  Laterality: N/A;  . kidney stone removal    . KNEE SURGERY      . LEFT HEART CATHETERIZATION WITH CORONARY ANGIOGRAM N/A 07/27/2011   Procedure: LEFT HEART CATHETERIZATION WITH CORONARY ANGIOGRAM;  Surgeon: Burnell Blanks, MD;  [facility];  Service: Cardiovascular;  Laterality: N/A;  . LEFT HEART CATHETERIZATION WITH CORONARY ANGIOGRAM N/A 10/31/2014   Procedure: LEFT HEART CATHETERIZATION WITH CORONARY ANGIOGRAM;  Surgeon: Sherren Mocha, MD;  Location: [facility] CATH LAB;  Service: Cardiovascular;  Laterality: N/A;  . PTCA     Hx of it.   Marland Kitchen SIGMOIDOSCOPY    . TEE WITHOUT CARDIOVERSION N/A 11/03/2014   Procedure: TRANSESOPHAGEAL ECHOCARDIOGRAM (TEE);  Surgeon: Ivin Poot, MD;  Location: [facility];  Service: Open Heart Surgery;  Laterality: N/A;  . TOTAL KNEE ARTHROPLASTY  02/2011   Right knee  . TOTAL KNEE ARTHROPLASTY Left 04/11/2016   Procedure: LEFT TOTAL KNEE ARTHROPLASTY;  Surgeon: Vickey Huger, MD;  Location: [facility];  Service: Orthopedics;  Laterality: Left;    Current Outpatient Prescriptions  Medication Sig Dispense Refill  . acetaminophen (TYLENOL) 500 MG tablet Take 500-1,000 mg by mouth 2 (two) times daily as needed (for pain).     Marland Kitchen clopidogrel (PLAVIX) 75 MG tablet TAKE ONE TABLET BY MOUTH ONCE DAILY 90 tablet 3  . isosorbide mononitrate (IMDUR) 30 MG 24 hr tablet Take 1 tablet (30 mg total) by mouth daily. 90 tablet 2  . lisinopril (PRINIVIL,ZESTRIL) 10 MG tablet TAKE ONE TABLET BY MOUTH ONCE DAILY 90 tablet 1  . metoprolol tartrate (LOPRESSOR) 25 MG  tablet TAKE ONE & ONE-HALF TABLETS BY MOUTH TWICE DAILY 270 tablet 1  . nitroGLYCERIN (NITROSTAT) 0.4 MG SL tablet Place 0.4 mg under the tongue every 5 (five) minutes as needed for chest pain.    . pantoprazole (PROTONIX) 40 MG tablet Take 1 tablet (40 mg total) by mouth daily. 90 tablet 3  . rosuvastatin (CRESTOR) 5 MG tablet Take 1 tablet (5 mg total) by mouth daily. 90 tablet 3   No current facility-administered medications for this visit.     Allergies  Allergen Reactions  .  Protamine Rash    Hypotension, Rash, unstable vitals.    . Statins Other (See Comments)    Caused soreness (Lipitor, Zocor)  . Rosuvastatin Calcium Other (See Comments)    Caused soreness (Lipitor, Zocor)  . Imodium [Loperamide] Itching and Rash    Social History   Social History  . Marital status: Married    Spouse name: N/A  . Number of children: N/A  . Years of education: N/A   Occupational History  . Not on file.   Social History Main Topics  . Smoking status: Former Research scientist (life sciences)  . Smokeless tobacco: Former Systems developer     Comment: quit in 1985. smoked 1ppd for 35 years   . Alcohol use No  . Drug use: No  . Sexual activity: No   Other Topics Concern  . Not on file   Social History Narrative   Married with children. 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 [name] 05/18/10. 10:20 am    Has living will,  HCPOA: jerrie Parrales, full code (reviewed 22)    Family History  Problem Relation Age of Onset  . Heart disease Father   . Heart attack Father   . Heart failure Mother   . COPD Mother   . Heart disease Sister   . Heart attack Sister   . Heart attack Brother     Review of Systems:  As stated in the HPI and otherwise negative.   BP 132/70   Pulse (!) 56   Ht 5' 6.5" (1.689 m)   Wt 186 lb (84.4 kg)   SpO2 97%   BMI 29.57 kg/m   Physical Examination:  General: Well developed, well nourished, NAD  HEENT: OP clear, mucus membranes moist  SKIN: warm, dry. No rashes. Neuro: No focal deficits  Musculoskeletal: Muscle strength 5/5 all ext  Psychiatric: Mood and affect normal  Neck: No JVD, no carotid bruits, no thyromegaly, no lymphadenopathy.  Lungs:Clear bilaterally, no wheezes, rhonci, crackles Cardiovascular: Regular rate and rhythm. No murmurs, gallops or rubs. Abdomen:Soft. Bowel sounds present. Non-tender.  Extremities: No lower extremity edema. Pulses are 2 + in the bilateral DP/PT.  Echo April 2017: Left ventricle: The cavity size  was normal. There was moderate   concentric hypertrophy. Systolic function was normal. The   estimated ejection fraction was in the range of 60% to 65%. Wall   motion was normal; there were no regional wall motion   abnormalities. Features are consistent with a pseudonormal left   ventricular filling pattern, with concomitant abnormal relaxation   and increased filling pressure (grade 2 diastolic dysfunction). - Left atrium: The atrium was severely dilated. - Right ventricle: The cavity size was mildly dilated. Wall   thickness was normal. - Right atrium: The atrium was mildly dilated.  EKG:  EKG is not ordered today. The ekg ordered today demonstrates   Recent Labs: 03/03/2017: ALT 16; BUN 18; Creatinine, Ser 0.81; Hemoglobin 13.2;  Platelets 230.0; Potassium 3.7; Sodium 141   Lipid Panel    Component Value Date/Time   CHOL 91 03/03/2017 0919   CHOL 122 09/22/2016 0912   TRIG 91.0 03/03/2017 0919   HDL 26.20 (L) 03/03/2017 0919   HDL 39 (L) 09/22/2016 0912   CHOLHDL 3 03/03/2017 0919   VLDL 18.2 03/03/2017 0919   LDLCALC 47 03/03/2017 0919   LDLCALC 63 09/22/2016 0912     Wt Readings from Last 3 Encounters:  04/27/17 186 lb (84.4 kg)  03/10/17 186 lb 4 oz (84.5 kg)  03/03/17 181 lb 12 oz (82.4 kg)     Other studies Reviewed: Additional studies/ records that were reviewed today include: . Review of the above records demonstrates:    Assessment and Plan:   1. CAD without angina: He is s/p 4V CABG in 2016. He is having no chest pain suggestive of angina. Continue Plavix, beta blocker and Ace-inh.     2. HTN: BP is controlled. No changes today.   3. Hyperlipidemia: Lipids at goal. Continue statin. He is having some muscle aches. Will try Crestor 5 mg every other day.   Current medicines are reviewed at length with the patient today.  The patient does not have concerns regarding medicines.  The following changes have been made:  no change  Labs/ tests ordered today  include:   No orders of the defined types were placed in this encounter.   Disposition:   FU with me in 12  months  Signed, Lauree Chandler, MD 04/27/2017 4:20 PM    [facility] [place], [place], [place]  [address] Phone: [phone]; Fax: [phone]

## 2017-04-27 NOTE — Patient Instructions (Signed)

## 2017-07-17 ENCOUNTER — Other Ambulatory Visit: Payer: Self-pay | Admitting: Cardiovascular Disease

## 2017-07-17 MED ORDER — ROSUVASTATIN CALCIUM 5 MG PO TABS: 5.0000 mg | ORAL_TABLET | ORAL | 3 refills | Status: DC

## 2017-07-17 NOTE — Telephone Encounter (Signed)
Per last office visit with Dr Angelena Form-  3. Hyperlipidemia: Lipids at goal. Continue statin. He is having some muscle aches. Will try Crestor 5 mg every other day.   Should this still be sent in for 5 mg qd?  Please advise. Thanks, MI

## 2017-07-17 NOTE — Telephone Encounter (Signed)
I spoke with pt who reports he is taking Rosuvastatin every other day.  Will send refill for this dose to [facility]

## 2017-09-01 ENCOUNTER — Telehealth: Payer: Self-pay | Admitting: Family Medicine

## 2017-09-01 ENCOUNTER — Other Ambulatory Visit (INDEPENDENT_AMBULATORY_CARE_PROVIDER_SITE_OTHER): Payer: Medicare HMO

## 2017-09-01 DIAGNOSIS — E1142 Type 2 diabetes mellitus with diabetic polyneuropathy: Secondary | ICD-10-CM | POA: Diagnosis not present

## 2017-09-01 LAB — LIPID PANEL
CHOLESTEROL: 140 mg/dL (ref 0–200)
HDL: 42.3 mg/dL (ref >39.00)
LDL Cholesterol: 76 mg/dL (ref 0–99)
NonHDL: 97.29
Total CHOL/HDL Ratio: 3
Triglycerides: 108 mg/dL (ref 0.0–149.0)
VLDL: 21.6 mg/dL (ref 0.0–40.0)

## 2017-09-01 LAB — COMPREHENSIVE METABOLIC PANEL
ALBUMIN: 4.2 g/dL (ref 3.5–5.2)
ALK PHOS: 49 U/L (ref 39–117)
ALT: 14 U/L (ref 0–53)
AST: 17 U/L (ref 0–37)
BILIRUBIN TOTAL: 0.6 mg/dL (ref 0.2–1.2)
BUN: 18 mg/dL (ref 6–23)
CO2: 28 mEq/L (ref 19–32)
Calcium: 9.7 mg/dL (ref 8.4–10.5)
Chloride: 106 mEq/L (ref 96–112)
Creatinine, Ser: 0.97 mg/dL (ref 0.40–1.50)
GFR: 79.01 mL/min (ref >60.00)
Glucose, Bld: 132 mg/dL — ABNORMAL HIGH (ref 70–99)
POTASSIUM: 4.2 meq/L (ref 3.5–5.1)
Sodium: 142 mEq/L (ref 135–145)
Total Protein: 6.9 g/dL (ref 6.0–8.3)

## 2017-09-01 LAB — HEMOGLOBIN A1C: HEMOGLOBIN A1C: 6.9 % — AB (ref 4.6–6.5)

## 2017-09-01 NOTE — Telephone Encounter (Signed)
-----   Message from Ellamae Sia sent at 08/22/2017  4:21 PM EST ----- Regarding: Lab orders for Friday, 2.22.19 Lab orders for a DM f/u

## 2017-09-08 ENCOUNTER — Other Ambulatory Visit: Payer: Self-pay

## 2017-09-08 ENCOUNTER — Ambulatory Visit (INDEPENDENT_AMBULATORY_CARE_PROVIDER_SITE_OTHER): Payer: Medicare HMO | Admitting: Family Medicine

## 2017-09-08 ENCOUNTER — Encounter: Payer: Self-pay | Admitting: Family Medicine

## 2017-09-08 VITALS — BP 132/70 | HR 66 | Temp 98.3°F | Ht 66.5 in | Wt 191.8 lb

## 2017-09-08 DIAGNOSIS — E1142 Type 2 diabetes mellitus with diabetic polyneuropathy: Secondary | ICD-10-CM

## 2017-09-08 DIAGNOSIS — B353 Tinea pedis: Secondary | ICD-10-CM

## 2017-09-08 DIAGNOSIS — I10 Essential (primary) hypertension: Secondary | ICD-10-CM

## 2017-09-08 DIAGNOSIS — E1140 Type 2 diabetes mellitus with diabetic neuropathy, unspecified: Secondary | ICD-10-CM

## 2017-09-08 HISTORY — DX: Tinea pedis: B35.3

## 2017-09-08 LAB — HM DIABETES FOOT EXAM

## 2017-09-08 MED ORDER — TERBINAFINE HCL 1 % EX CREA: 1.0000 "application " | TOPICAL_CREAM | Freq: Two times a day (BID) | CUTANEOUS | 0 refills | Status: DC

## 2017-09-08 NOTE — Assessment & Plan Note (Signed)
BID lamisil

## 2017-09-08 NOTE — Patient Instructions (Addendum)
Work on regular exercise and decrease carbhydrate in diet. Work on low cholesterol diet as well. Increase crestor back to daily  Complete 4 weeks of Lamisil twice daily for fungal infeciton of feet.. Follow up if it is not improving.

## 2017-09-08 NOTE — Assessment & Plan Note (Signed)
Worsening control.. Recheck in 6 months.. May need medication.

## 2017-09-08 NOTE — Assessment & Plan Note (Signed)
Stable control. 

## 2017-09-08 NOTE — Assessment & Plan Note (Signed)
Well controlled. Continue current medication.  

## 2017-09-08 NOTE — Progress Notes (Signed)
   Subjective:    Patient ID: [name], male    DOB: 12-12-1934, 83 y.o.   MRN: [id number]  HPI  83 year old male presents for 6 month DM  follow up.  Diabetes:   Lab Results  Component Value Date   HGBA1C 6.9 (H) 09/01/2017  Using medications without difficulties: Hypoglycemic episodes:none Hyperglycemic episodes: none Feet problems: neuropathy, no ulcers.. Has been treating athletes foot with cream. Blood Sugars averaging: FBS 120 eye exam within last year: thinks yes.. Will get records.   Elevated Cholesterol:  Hx of CABG, CAD ( on plavix) worsened control with every other day crestor down from daily. He states decreased has not changed back pain.. Will restart  LDL almost at goal on crestor 5 mg daily.. Lab Results  Component Value Date   CHOL 140 09/01/2017   HDL 42.30 09/01/2017   LDLCALC 76 09/01/2017   TRIG 108.0 09/01/2017   CHOLHDL 3 09/01/2017  Using medications without problems: Muscle aches:  Low back apin Diet compliance: moderate Exercise: none lately Other complaints:  Hypertension:    Good control on lisinopril, metoprolol, imdur BP Readings from Last 3 Encounters:  09/08/17 132/70  04/27/17 132/70  03/10/17 124/64  Using medication without problems or lightheadedness:  none Chest pain with exertion:none Edema:none Short of breath:none Average home BPs: Other issues:  Wt Readings from Last 3 Encounters:  09/08/17 191 lb 12 oz (87 kg)  04/27/17 186 lb (84.4 kg)  03/10/17 186 lb 4 oz (84.5 kg)     Social History /Family History/Past Medical History reviewed in detail and updated in EMR if needed. Blood pressure 132/70, pulse 66, temperature 98.3 F (36.8 C), temperature source Oral, height 5' 6.5" (1.689 m), weight 191 lb 12 oz (87 kg).   Review of Systems  Constitutional: Negative for fatigue and fever.  HENT: Negative for ear pain.   Eyes: Negative for pain.  Respiratory: Negative for cough and shortness of breath.   Cardiovascular:  Negative for chest pain, palpitations and leg swelling.  Gastrointestinal: Negative for abdominal pain.  Genitourinary: Negative for dysuria.  Musculoskeletal: Negative for arthralgias.  Neurological: Negative for syncope, light-headedness and headaches.  Psychiatric/Behavioral: Negative for dysphoric mood.       Objective:   Physical Exam  Constitutional: Vital signs are normal. He appears well-developed and well-nourished.  HENT:  Head: Normocephalic.  Right Ear: Hearing normal.  Left Ear: Hearing normal.  Nose: Nose normal.  Mouth/Throat: Oropharynx is clear and moist and mucous membranes are normal.  Neck: Trachea normal. Carotid bruit is not present. No thyroid mass and no thyromegaly present.  Cardiovascular: Normal rate, regular rhythm and normal pulses. Exam reveals no gallop, no distant heart sounds and no friction rub.  No murmur heard. No peripheral edema  Pulmonary/Chest: Effort normal and breath sounds normal. No respiratory distress.  Skin: Skin is warm, dry and intact. No rash noted.  Psychiatric: He has a normal mood and affect. His speech is normal and behavior is normal. Thought content normal.    Diabetic foot exam:  inspection: dry flaky skin, erythematous papules on dorsal foot. No skin breakdown No calluses  Normal DP pulses Normal sensation to light touch and monofilament Nails normal t      Assessment & Plan:

## 2017-09-25 ENCOUNTER — Other Ambulatory Visit: Payer: Self-pay | Admitting: Cardiovascular Disease

## 2017-10-12 ENCOUNTER — Other Ambulatory Visit: Payer: Self-pay | Admitting: Cardiovascular Disease

## 2017-10-16 ENCOUNTER — Other Ambulatory Visit: Payer: Self-pay | Admitting: Cardiovascular Disease

## 2017-10-23 ENCOUNTER — Other Ambulatory Visit: Payer: Self-pay | Admitting: Cardiovascular Disease

## 2017-11-21 ENCOUNTER — Other Ambulatory Visit: Payer: Self-pay | Admitting: Family Medicine

## 2017-11-21 ENCOUNTER — Telehealth: Payer: Self-pay | Admitting: Family Medicine

## 2017-11-21 NOTE — Telephone Encounter (Signed)
Copied from [place] [phone] [id number]. Topic: Quick Communication - Rx Refill/Question >> Nov 21, 2017  3:34 PM Waylan Rocher, Lumin L wrote: Medication: rosuvastatin (CRESTOR) 5 MG tablet (90 days supply) Has the patient contacted their pharmacy? Yes.   (Agent: If no, request that the patient contact the pharmacy for the refill.) Preferred Pharmacy (with phone number or street name): [facility] [address] [place] [address] Phone: [phone] Fax: [phone] Agent: Please be advised that RX refills may take up to 3 business days. We ask that you follow-up with your pharmacy.  Patient needs a new script for a 90 day supply because his dosages were changed by Dr. Diona Browner.

## 2017-11-22 MED ORDER — ROSUVASTATIN CALCIUM 5 MG PO TABS: 5.0000 mg | ORAL_TABLET | Freq: Every day | ORAL | 2 refills | Status: DC

## 2017-12-24 IMAGING — CR DG CHEST 2V
2 series · 2 of 2 positions shown · non-contrast
Comparison: 12/17/2014

CLINICAL DATA: Fell knee arthroplasty.  Preop testing.

EXAM:
CHEST  2 VIEW

[w chest pa]
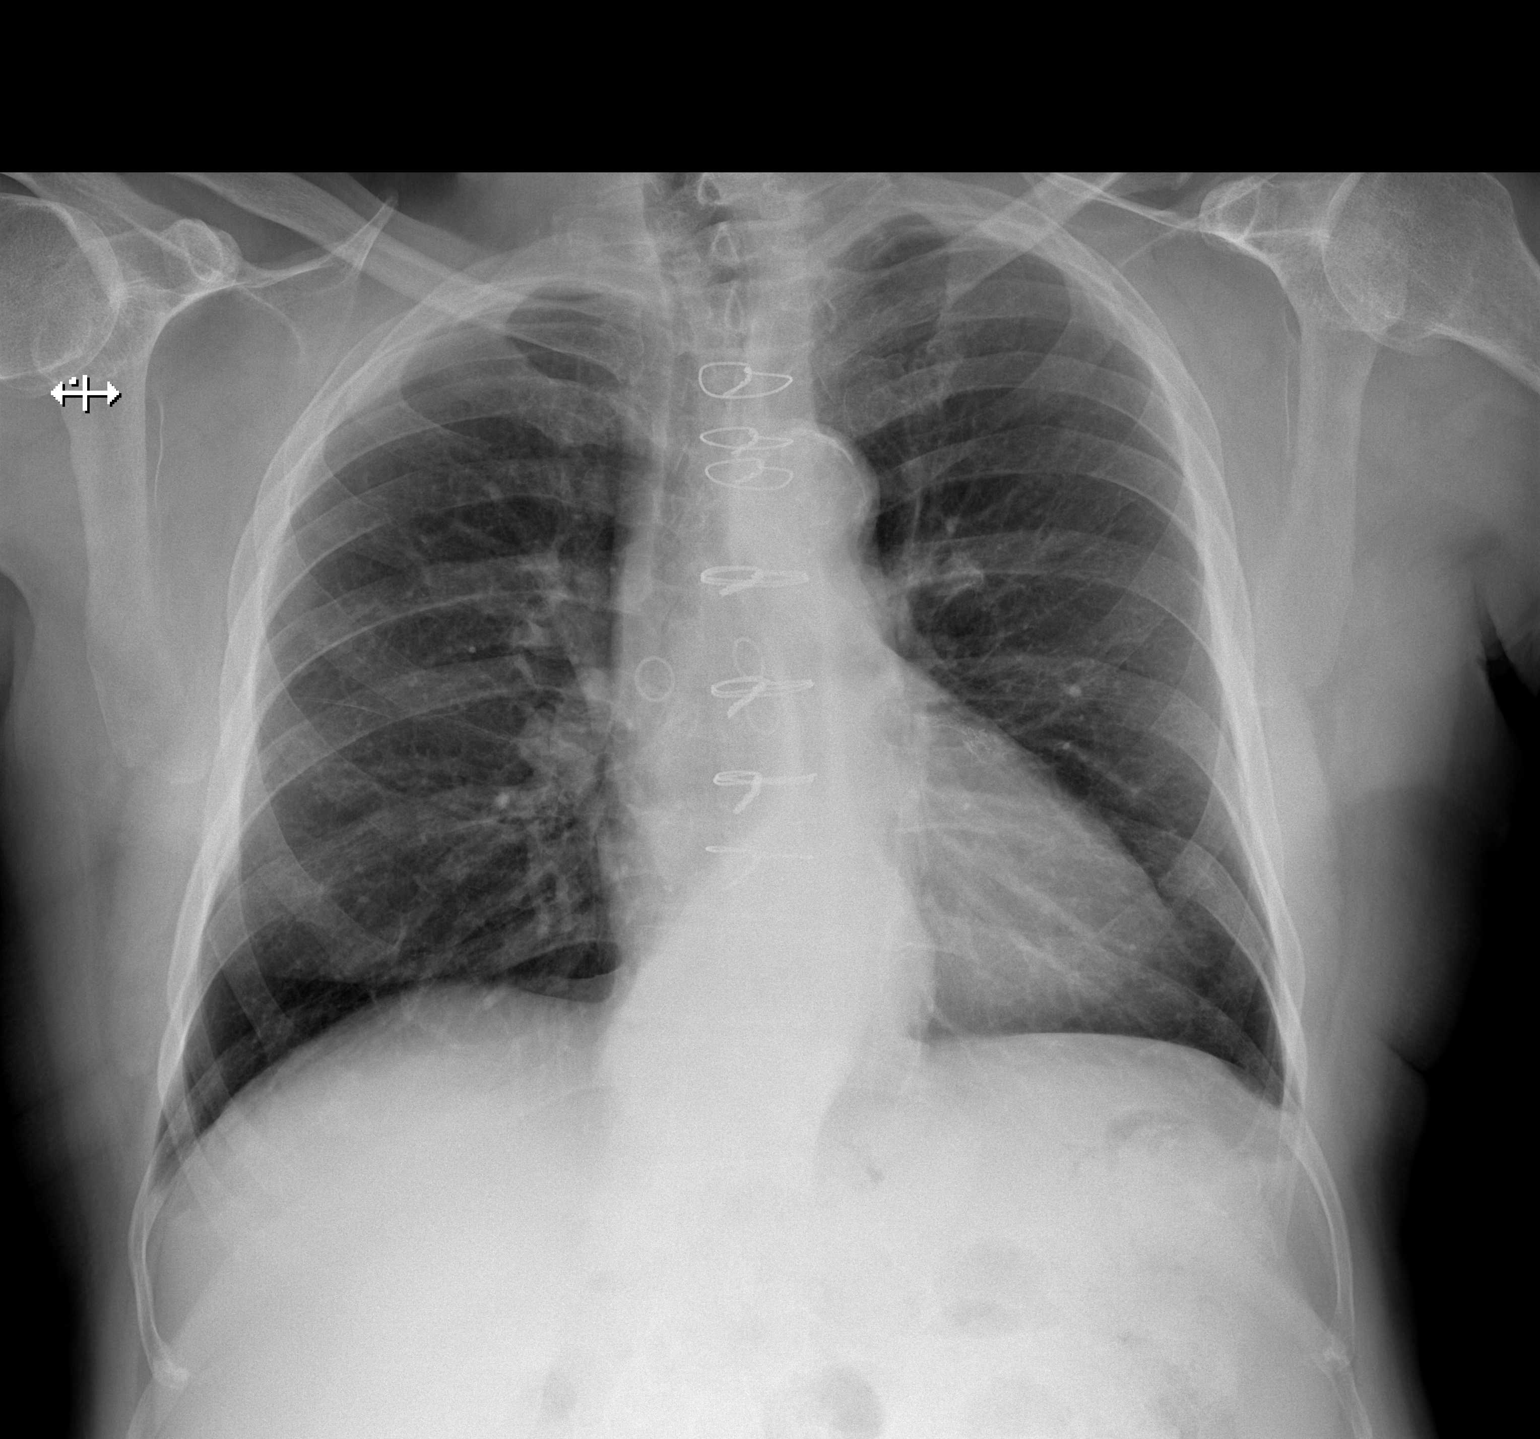

[w chest lat]
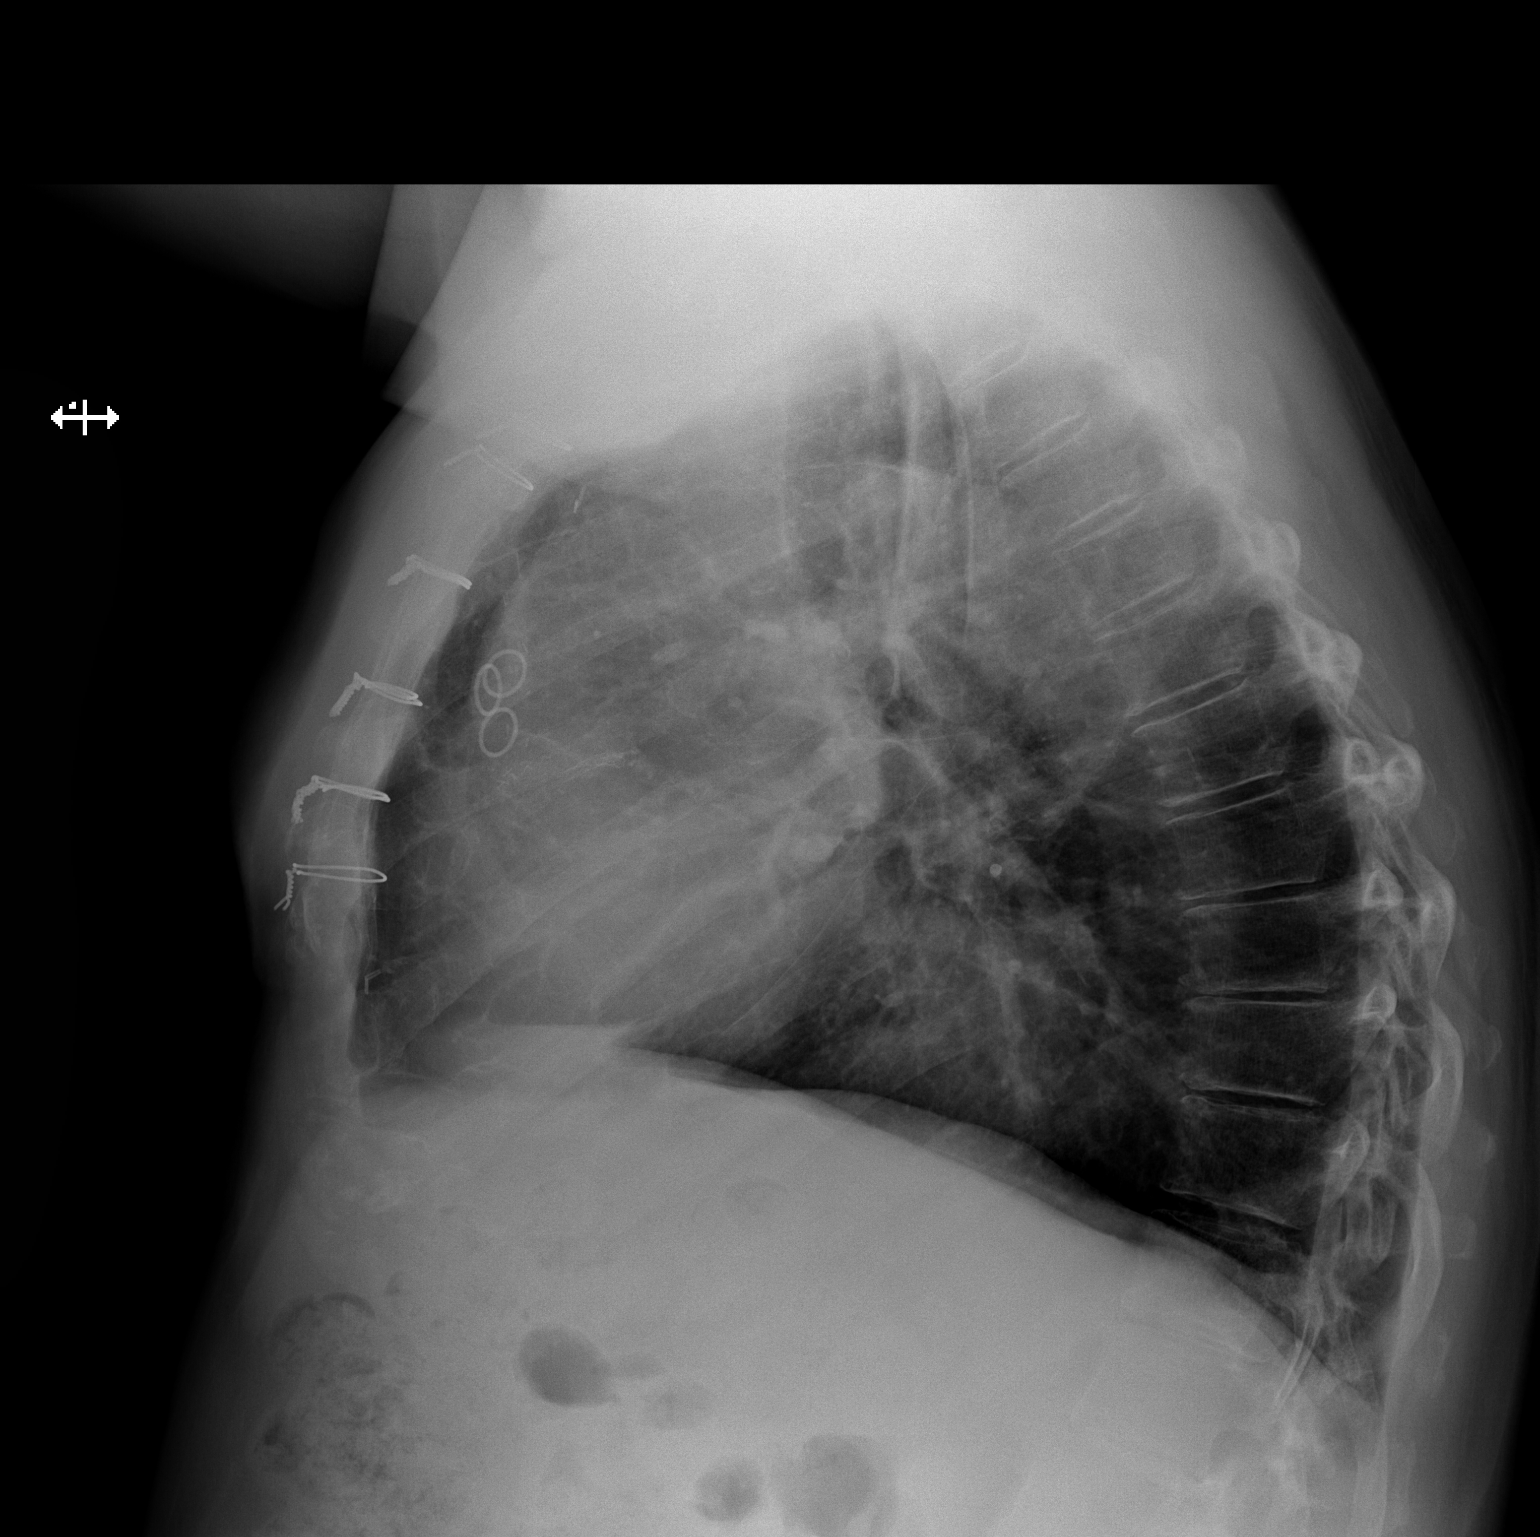

[2 of 2 positions shown; findings below may reference images not displayed]

FINDINGS: Stable normal heart size. Status post CABG and coronary stenting.
Moderate hiatal hernia. There is no edema, consolidation, effusion,
or pneumothorax. Remote and healed lateral left clavicle fracture.
IMPRESSION: No active disease.

Hiatal hernia.

Coronary atherosclerosis.

## 2018-01-13 IMAGING — CT CT ANGIO CHEST
1 of 8 series · 17 of 36 positions shown · IV contrast (Iohexol (Omnipaque 350))
Comparison: Chest radiograph yesterday.  Chest CT 10/31/2014

CLINICAL DATA: Left-sided chest pain.  Shortness of breath.

EXAM:
CT ANGIOGRAPHY CHEST WITH CONTRAST
TECHNIQUE: Multidetector CT imaging of the chest was performed using the
standard protocol during bolus administration of intravenous
contrast. Multiplanar CT image reconstructions and MIPs were
obtained to evaluate the vascular anatomy.
CONTRAST:  80 cc Isovue 370 IV

[Series 406: thins pacs · axial · 0.68mm/px · z∈[+49,+305]mm · 17 of 290 slices shown]
[im 17/290  lung]
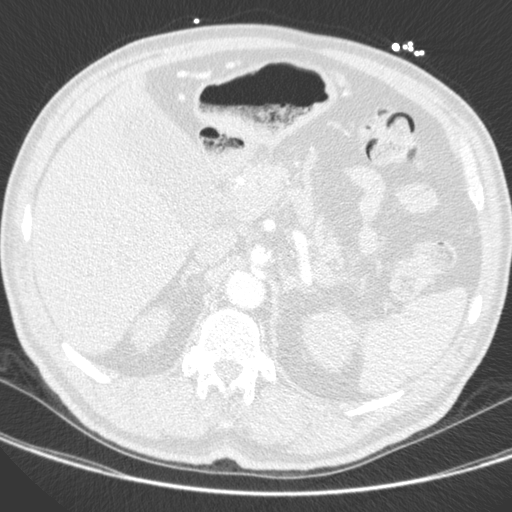
[im 33/290  mediastinal]
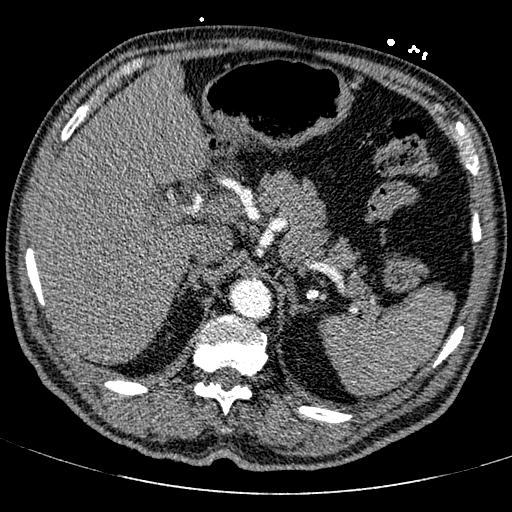
[im 49/290  lung]
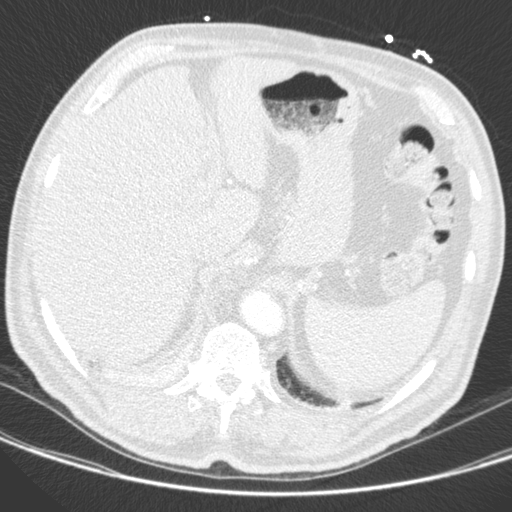
[im 65/290  mediastinal]
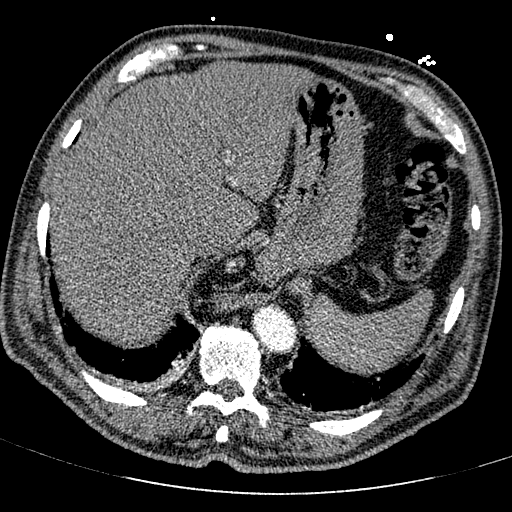
[im 81/290  lung]
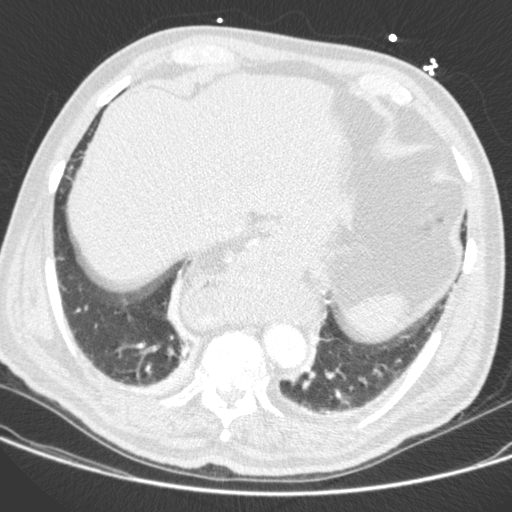
[im 97/290  mediastinal]
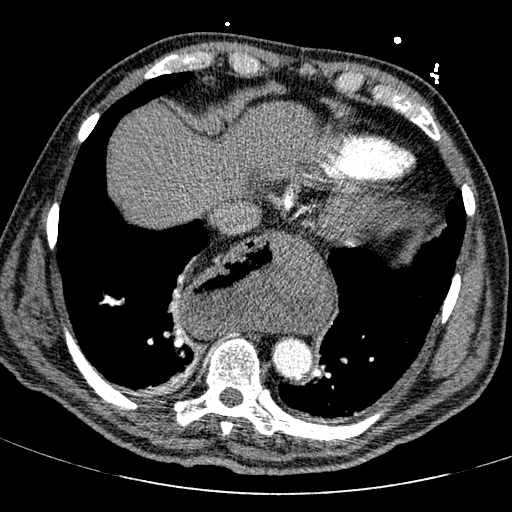
[im 113/290  lung]
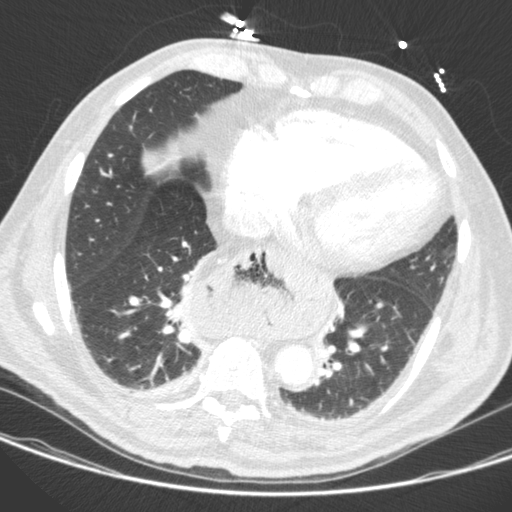
[im 129/290  mediastinal]
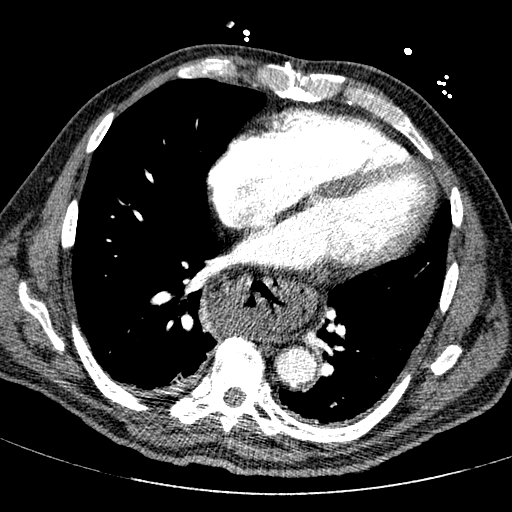
[im 145/290  lung]
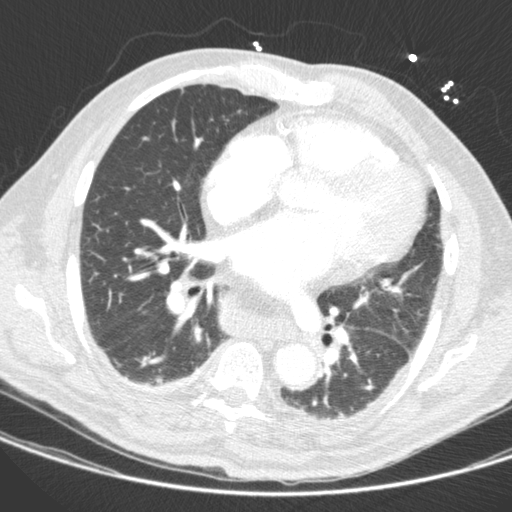
[im 161/290  mediastinal]
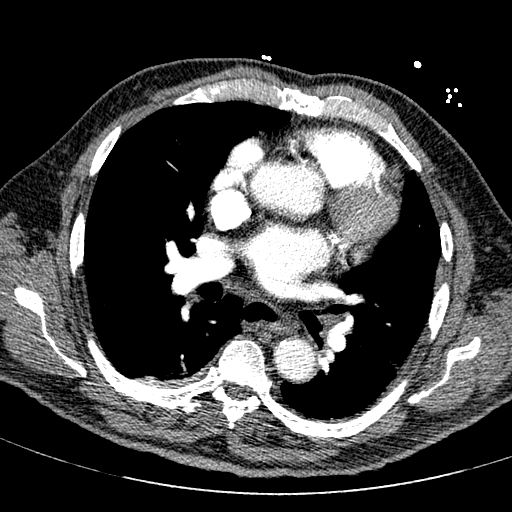
[im 177/290  lung]
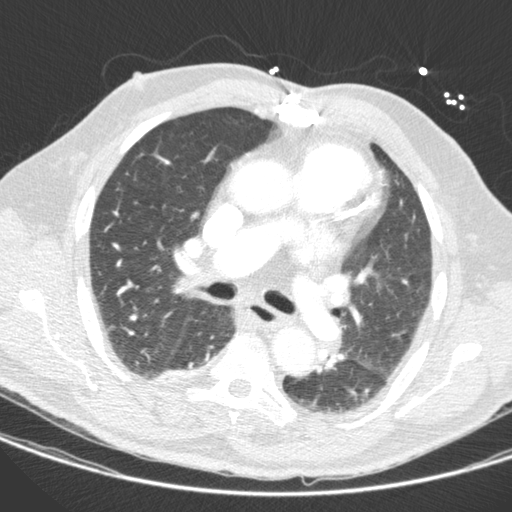
[im 193/290  mediastinal]
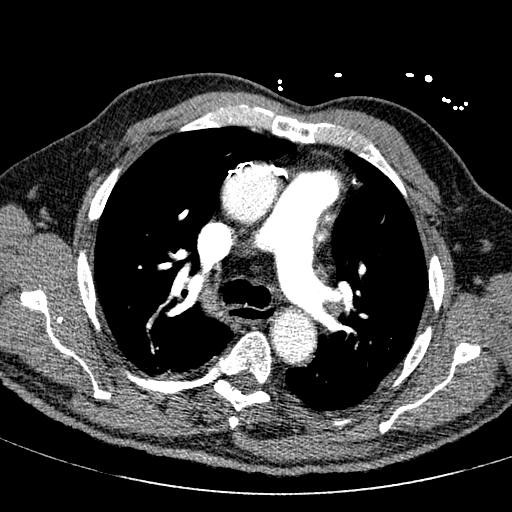
[im 209/290  lung]
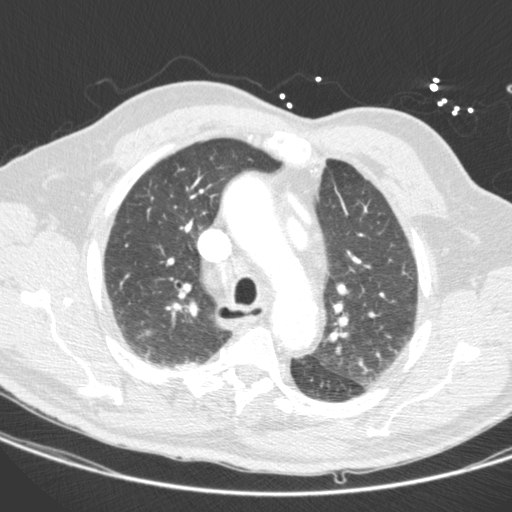
[im 225/290  mediastinal]
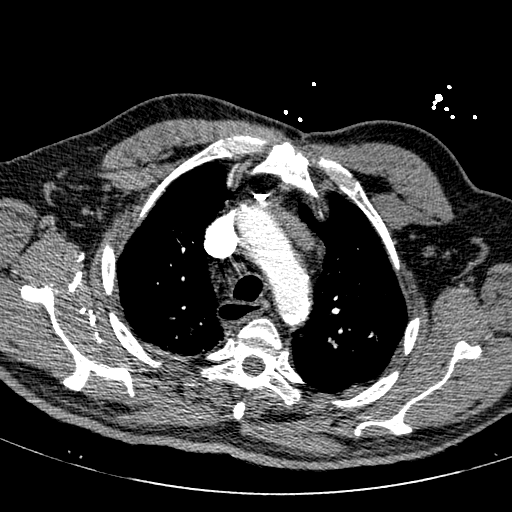
[im 241/290  lung]
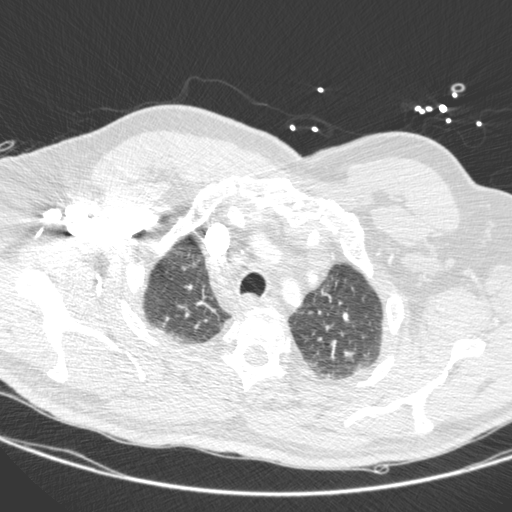
[im 257/290  mediastinal]
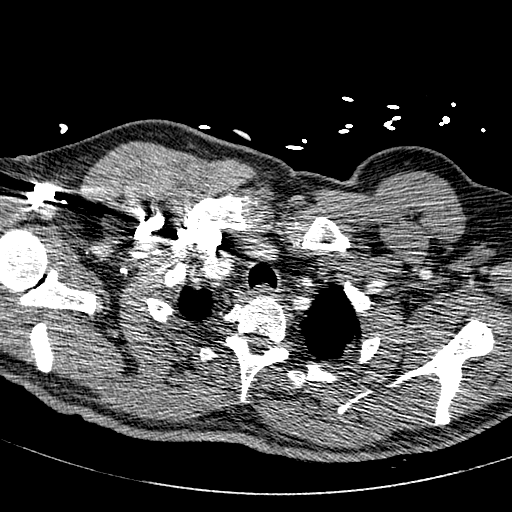
[im 273/290  lung]
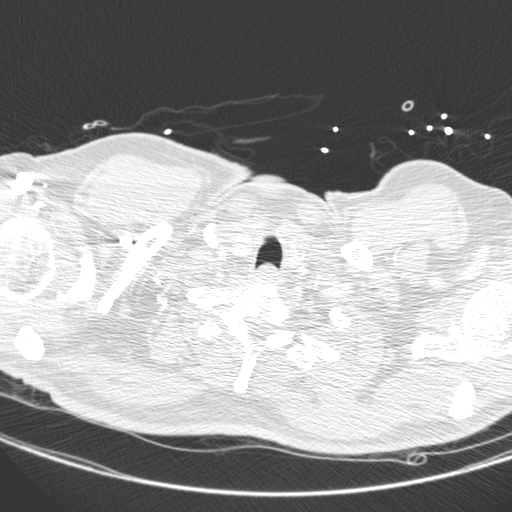

[17 of 36 positions shown; findings below may reference images not displayed]

FINDINGS: Cardiovascular: There are no filling defects within the pulmonary
arteries to suggest pulmonary embolus. Atherosclerosis of the
thoracic aorta with ectasia, no aneurysm. Cardiomegaly with coronary
artery calcifications, patient is post CABG. No pericardial
effusion.

Mediastinum/Nodes: Multiple small mediastinal and hilar nodes, not
enlarged by size criteria. Moderate-sized hiatal hernia. More
proximal esophagus is patulous. Trachea is patent. No evidence of
thyroid nodule.

Lungs/Pleura: Dependent atelectasis in the lower lobes, right
greater than left. Minimal smooth septal thickening. No confluent
airspace disease. Calcified granuloma in the right upper lobe. No
pulmonary mass. No pleural effusion.

Upper Abdomen: Hepatic steatosis.  Gallbladder appears distended.

Musculoskeletal: There are no acute or suspicious osseous
abnormalities.

Review of the MIP images confirms the above findings.
IMPRESSION: 1. No pulmonary embolus.
2. Suspect minimal pulmonary edema.
3. Moderate-sized hiatal hernia.
4. Distended gallbladder is partially included in the upper abdomen.
If there are any right upper quadrant symptoms or clinical concern
for biliary pathology, recommend ultrasound evaluation.

## 2018-02-07 ENCOUNTER — Encounter: Payer: Self-pay | Admitting: *Deleted

## 2018-02-07 ENCOUNTER — Encounter: Payer: Self-pay | Admitting: Physician Assistant

## 2018-02-07 ENCOUNTER — Telehealth: Payer: Self-pay | Admitting: Cardiovascular Disease

## 2018-02-07 ENCOUNTER — Ambulatory Visit: Payer: Medicare HMO | Admitting: Physician Assistant

## 2018-02-07 VITALS — BP 130/66 | HR 55 | Ht 68.0 in | Wt 184.8 lb

## 2018-02-07 DIAGNOSIS — I2510 Atherosclerotic heart disease of native coronary artery without angina pectoris: Secondary | ICD-10-CM | POA: Diagnosis not present

## 2018-02-07 DIAGNOSIS — E785 Hyperlipidemia, unspecified: Secondary | ICD-10-CM | POA: Insufficient documentation

## 2018-02-07 DIAGNOSIS — E118 Type 2 diabetes mellitus with unspecified complications: Secondary | ICD-10-CM | POA: Diagnosis not present

## 2018-02-07 DIAGNOSIS — R0789 Other chest pain: Secondary | ICD-10-CM

## 2018-02-07 DIAGNOSIS — R001 Bradycardia, unspecified: Secondary | ICD-10-CM

## 2018-02-07 DIAGNOSIS — E1169 Type 2 diabetes mellitus with other specified complication: Secondary | ICD-10-CM | POA: Insufficient documentation

## 2018-02-07 DIAGNOSIS — I10 Essential (primary) hypertension: Secondary | ICD-10-CM | POA: Diagnosis not present

## 2018-02-07 MED ORDER — LISINOPRIL 20 MG PO TABS: 20.0000 mg | ORAL_TABLET | Freq: Every day | ORAL | 3 refills | Status: DC

## 2018-02-07 MED ORDER — METOPROLOL TARTRATE 25 MG PO TABS: 25.0000 mg | ORAL_TABLET | Freq: Two times a day (BID) | ORAL | 3 refills | Status: DC

## 2018-02-07 NOTE — Telephone Encounter (Signed)
Received call transferred directly from operator and spoke with pt's wife. She reports pt has noted irregular heart rate for at least the last 10 days.  He can feel irregular beats.  BP earlier today was 193/94. Rechecked and is now 158/74.  Heart rate in 50's.  Had one episode of chest pain while at church on Sunday.  Relived with 1 NTG and TUMS.  No pain since. He has chronic lightheadedness.  This has not increased.  I scheduled pt to see Richardson Dopp, PA today at 12:15.

## 2018-02-07 NOTE — Progress Notes (Signed)
Cardiology Office Note:    Date:  02/07/2018   ID:  [name], DOB 1935/02/04, MRN [id number]  PCP:  Jinny Sanders, MD  Cardiologist:  Lauree Chandler, MD   Referring MD: Jinny Sanders, MD   Chief Complaint  Patient presents with  . Palpitations  . Chest Pain    History of Present Illness:    [name] is a 83 y.o. male with coronary artery disease s/p drug eluting stent to the mid LAD in 2005 and CABG and 4/16 (LIMA-LAD, SVG-DX, SVG-PDA, SVG-OM), hypertension, hyperlipidemia, diabetes.  He had a traumatic subdural hematoma in 2015.  He is intolerant to statins.  Last seen by Dr. Angelena Form 10/18.  [name] returns for evaluation of palpitations, chest pain, uncontrolled blood pressure.  He is here with his wife.  About 10 days ago, while at church, he developed substernal/epigastric discomfort.  He feels like this was a severe pain.  He had his wife drive home from church.  After getting home, he took Tums and nitroglycerin x1.  His symptoms resolved shortly after this.  He has not had a recurrence.  However, he has had an irregular pulse since this episode of chest discomfort.  He has a long history of PACs.  However, his current symptoms are different.  His heart rate feels fast at times.  He also feels an irregular heartbeat in his chest.  He is been dizzy at times and also has lost his balance.  He denies syncope near syncope.  He denies orthopnea, PND or edema.  He has some shortness of breath with activity without significant change.  He denies decreased exercise tolerance.  Blood pressures at home have ranged 858-850 systolic.  CHADS2-VASc=5 (age x 2, HTN, CAD, DM).     Prior CV studies:   The following studies were reviewed today:  Echo 10/15/2015 Moderate concentric LVH, EF 60-65, normal wall motion, grade 2 diastolic dysfunction, severe LAE, mild RV dilation, mild RAE   LHC 10/31/14 LM:  distal left main has severe 95% stenosis at the bifurcation of the LAD and  left circumflex. LAD:  severely diseased vessel.  The stented segment mid LAD has mild diffuse in-stent restenosis. Prox 30-40%. Diffuse 80% mid LAD  LCx:  Prox 20-30%, mid 50% stenosis.  RCA: Ostial 50%, mid 50%, mid PDA 50-60% stenosis. LVEF is estimated at 65%, there is no significant mitral regurgitation    Echo 11/01/14 - Mild LVH. EF 60% to 65%. Wall motion was normal; there were no regional wall motion abnormalities. - Left atrium: The atrium was mildly to moderately dilated.   Past Medical History:  Diagnosis Date  . Coronary atherosclerosis of unspecified type of vessel, native or graft    s/p stent mid LAD 2005  . Depression   . DJD (degenerative joint disease)   . GERD (gastroesophageal reflux disease)   . History of hiatal hernia   . HTN (hypertension)   . Hyperlipidemia   . Pre-diabetes    Surgical Hx: The patient  has a past surgical history that includes Mitral valve replacement; Knee surgery; kidney stone removal; Sigmoidoscopy; Total knee arthroplasty (02/2011); Craniotomy (N/A, 01/18/2014); left heart catheterization with coronary angiogram (N/A, 07/27/2011); Cardiac catheterization; Brain surgery; left heart catheterization with coronary angiogram (N/A, 10/31/2014); Coronary artery bypass graft (N/A, 11/03/2014); TEE without cardioversion (N/A, 11/03/2014); and Total knee arthroplasty (Left, 04/11/2016).   Current Medications: Current Meds  Medication Sig  . acetaminophen (TYLENOL) 500 MG tablet Take 500-1,000 mg by  mouth 2 (two) times daily as needed (for pain).   Marland Kitchen clopidogrel (PLAVIX) 75 MG tablet TAKE 1 TABLET BY MOUTH ONCE DAILY  . isosorbide mononitrate (IMDUR) 30 MG 24 hr tablet TAKE 1 TABLET BY MOUTH ONCE DAILY  . nitroGLYCERIN (NITROSTAT) 0.4 MG SL tablet Place 0.4 mg under the tongue every 5 (five) minutes as needed for chest pain.  . pantoprazole (PROTONIX) 40 MG tablet TAKE 1 TABLET BY MOUTH ONCE DAILY  . rosuvastatin (CRESTOR) 5 MG tablet Take 1 tablet (5 mg  total) by mouth daily.  . [DISCONTINUED] lisinopril (PRINIVIL,ZESTRIL) 10 MG tablet TAKE 1 TABLET BY MOUTH ONCE DAILY  . [DISCONTINUED] metoprolol tartrate (LOPRESSOR) 25 MG tablet TAKE 1 & 1/2 (ONE & ONE-HALF) TABLETS BY MOUTH TWICE DAILY     Allergies:   Protamine; Statins; Rosuvastatin calcium; and Imodium [loperamide]   Social History   Tobacco Use  . Smoking status: Former Research scientist (life sciences)  . Smokeless tobacco: Former Systems developer  . Tobacco comment: quit in 1985. smoked 1ppd for 35 years   Substance Use Topics  . Alcohol use: No  . Drug use: No     Family Hx: The patient's family history includes COPD in his mother; Heart attack in his brother, father, and sister; Heart disease in his father and sister; Heart failure in his mother.  ROS:   Please see the history of present illness.    Review of Systems  Constitution: Positive for diaphoresis.  HENT: Positive for hearing loss.   Cardiovascular: Positive for chest pain and irregular heartbeat.  Hematologic/Lymphatic: Bruises/bleeds easily.  Musculoskeletal: Positive for back pain.  Genitourinary: Positive for incomplete emptying.  Neurological: Positive for dizziness and loss of balance.   All other systems reviewed and are negative.   EKGs/Labs/Other Test Reviewed:    EKG:  EKG is  ordered today.  The ekg ordered today demonstrates sinus bradycardia, heart rate 55, left axis deviation, right bundle branch block, QTC 428, PAC; rate after PAC much slower (30s)  Recent Labs: 03/03/2017: Hemoglobin 13.2; Platelets 230.0 09/01/2017: ALT 14; BUN 18; Creatinine, Ser 0.97; Potassium 4.2; Sodium 142   Recent Lipid Panel Lab Results  Component Value Date/Time   CHOL 140 09/01/2017 10:10 AM   CHOL 122 09/22/2016 09:12 AM   TRIG 108.0 09/01/2017 10:10 AM   HDL 42.30 09/01/2017 10:10 AM   HDL 39 (L) 09/22/2016 09:12 AM   CHOLHDL 3 09/01/2017 10:10 AM   LDLCALC 76 09/01/2017 10:10 AM   LDLCALC 63 09/22/2016 09:12 AM    Physical Exam:     VS:  BP 130/66   Pulse (!) 55   Ht 5\' 8"  (1.727 m)   Wt 184 lb 12.8 oz (83.8 kg)   SpO2 93%   BMI 28.10 kg/m     Wt Readings from Last 3 Encounters:  02/07/18 184 lb 12.8 oz (83.8 kg)  09/08/17 191 lb 12 oz (87 kg)  04/27/17 186 lb (84.4 kg)     Physical Exam  Constitutional: He is oriented to person, place, and time. He appears well-developed and well-nourished. No distress.  HENT:  Head: Normocephalic and atraumatic.  Eyes: No scleral icterus.  Neck: No JVD present.  Cardiovascular: Normal rate and regular rhythm.  No murmur heard. Pulmonary/Chest: Effort normal. He has no rales.  Abdominal: Soft. There is no hepatomegaly.  Musculoskeletal: He exhibits no edema.  Neurological: He is alert and oriented to person, place, and time.  Skin: Skin is warm and dry.  Psychiatric: He has a normal  mood and affect.    ASSESSMENT & PLAN:    Other chest pain He had one episode of chest pain almost 2 weeks ago.  It occurred at rest while at church.  He had relief with TUMS and NTG x 1.  He has not had a recurrence.  He denies exertional chest pain.  He denies symptoms reminiscent of his prior angina.  His ECG is unchanged.  He does note that his irregular pulse has been present since his chest pain.  It has been 3 years since his CABG.  He has diabetes.    -Arrange Lexiscan Myoview to rule out ischemia and scar  -Arrange Echo to rule out new wall motion abnormalities, assess EF  Bradycardia I suspect his bradycardia is leading to some of his symptoms.  However, he has not had any syncope or near syncope.  His HR is slower after a PAC on his ECG.  He has evidence of conduction system disease with RBBB.  I have suggested that he decrease his beta-blocker dose and wear a 48 Hr Holter to rule out pauses, etc.  -Decrease Metoprolol to 25 mg Twice daily  -48 Hr Holter  Coronary artery disease involving native coronary artery of native heart without angina pectoris Hx of prior DES to the  LAD in 2005 and CABG in 2016.  As noted, he had recent chest pain without recurrence.  He also has a hiatal hernia which may be contributing to some of his symptoms.  He is already on a PPI.  Proceed with Nuclear stress test as noted.  Continue Clopidogrel, statin.  Essential hypertension His blood pressure tends to run higher at home.  He notes his BP used to be well controlled on a beta-blocker/thiazide diuretic combination.  I will increase his Lisinopril as I am decreasing his Metoprolol.  We may need to consider low dose HCTZ if his BP remains above target.  -Increase Lisinopril to 20 mg QD  -BMET 1-2 weeks  Type 2 diabetes mellitus with complication, without long-term current use of insulin ([place])   Continue follow up with PCP for management.   Dispo:  Return in about 1 month (around 03/07/2018) for Follow up after testing w/ Dr. Angelena Form or Richardson Dopp, PA-C.   Medication Adjustments/Labs and Tests Ordered: Current medicines are reviewed at length with the patient today.  Concerns regarding medicines are outlined above.  Tests Ordered: Orders Placed This Encounter  Procedures  . Basic Metabolic Panel (BMET)  . Holter monitor - 48 hour  . Myocardial Perfusion Imaging  . EKG 12-Lead  . ECHOCARDIOGRAM COMPLETE   Medication Changes: Meds ordered this encounter  Medications  . lisinopril (PRINIVIL,ZESTRIL) 20 MG tablet    Sig: Take 1 tablet (20 mg total) by mouth daily.    Dispense:  90 tablet    Refill:  3    DOSE INCREASE  . metoprolol tartrate (LOPRESSOR) 25 MG tablet    Sig: Take 1 tablet (25 mg total) by mouth 2 (two) times daily.    Dispense:  180 tablet    Refill:  3    Signed, Richardson Dopp, PA-C  02/07/2018 1:23 PM    [facility] [place], [place], [place]  [address] Phone: [phone]; Fax: [phone]

## 2018-02-07 NOTE — Patient Instructions (Signed)
Medication Instructions:  1. DECREASE METOPROLOL TARTRATE TO 25 MG TWICE DAILY; NEW RX HAS BEEN SENT IN WITH THE NEW DIRECTIONS  2. INCREASE LISINOPRIL TO 20 MG DAILY; NEW RX HAS BEEN SENT IN WITH THE NEW DIRECTIONS    Labwork: 1. BMET TO BE DONE IN 1-2 WEEKS   Testing/Procedures: 1. Your physician has requested that you have an echocardiogram. Echocardiography is a painless test that uses sound waves to create images of your heart. It provides your doctor with information about the size and shape of your heart and how well your heart's chambers and valves are working. This procedure takes approximately one hour. There are no restrictions for this procedure.  2. Your physician has requested that you have a lexiscan myoview. For further information please visit [email or web address]. Please follow instruction sheet, as given.  3. Your physician has recommended that you wear a 48 HOUR holter monitor. Holter monitors are medical devices that record the heart's electrical activity. Doctors most often use these monitors to diagnose arrhythmias. Arrhythmias are problems with the speed or rhythm of the heartbeat. The monitor is a small, portable device. You can wear one while you do your normal daily activities. This is usually used to diagnose what is causing palpitations/syncope (passing out).    Follow-Up: SCOTT WEAVER, PAC 3-4 WEEKS SAME DAY DR. Angelena Form IS IN THE OFFICE IF POSSIBLE   Any Other Special Instructions Will Be Listed Below (If Applicable).     If you need a refill on your cardiac medications before your next appointment, please call your pharmacy.

## 2018-02-07 NOTE — Telephone Encounter (Signed)
New Message        Patient c/o Palpitations:  High priority if patient c/o lightheadedness, shortness of breath, or chest pain  1) How long have you had palpitations/irregular HR/ Afib? Are you having the symptoms now? Yes  2) Are you currently experiencing lightheadedness, SOB or CP? Yes  3) Do you have a history of afib (atrial fibrillation) or irregular heart rhythm? No  4) Have you checked your BP or HR? (document readings if available): 173/94 BP 54 HR  5) Are you experiencing any other symptoms? Lightheadness

## 2018-02-13 ENCOUNTER — Telehealth (HOSPITAL_COMMUNITY): Payer: Self-pay | Admitting: *Deleted

## 2018-02-13 NOTE — Telephone Encounter (Signed)
Patient given detailed instructions per Myocardial Perfusion Study Information Sheet for the test on 02/15/18. Patient notified to arrive 15 minutes early and that it is imperative to arrive on time for appointment to keep from having the test rescheduled.  If you need to cancel or reschedule your appointment, please call the office within 24 hours of your appointment. . Patient verbalized understanding. Kirstie Peri

## 2018-02-15 ENCOUNTER — Ambulatory Visit (INDEPENDENT_AMBULATORY_CARE_PROVIDER_SITE_OTHER): Payer: Medicare HMO

## 2018-02-15 ENCOUNTER — Other Ambulatory Visit: Payer: Medicare HMO | Admitting: *Deleted

## 2018-02-15 ENCOUNTER — Encounter (HOSPITAL_BASED_OUTPATIENT_CLINIC_OR_DEPARTMENT_OTHER): Payer: Medicare HMO

## 2018-02-15 ENCOUNTER — Encounter: Payer: Self-pay | Admitting: Physician Assistant

## 2018-02-15 ENCOUNTER — Ambulatory Visit (HOSPITAL_COMMUNITY): Payer: Medicare HMO | Attending: Cardiovascular Disease

## 2018-02-15 ENCOUNTER — Other Ambulatory Visit: Payer: Self-pay

## 2018-02-15 ENCOUNTER — Telehealth: Payer: Self-pay | Admitting: *Deleted

## 2018-02-15 DIAGNOSIS — R001 Bradycardia, unspecified: Secondary | ICD-10-CM

## 2018-02-15 DIAGNOSIS — R0789 Other chest pain: Secondary | ICD-10-CM

## 2018-02-15 DIAGNOSIS — R002 Palpitations: Secondary | ICD-10-CM | POA: Diagnosis not present

## 2018-02-15 DIAGNOSIS — I10 Essential (primary) hypertension: Secondary | ICD-10-CM

## 2018-02-15 DIAGNOSIS — I119 Hypertensive heart disease without heart failure: Secondary | ICD-10-CM | POA: Insufficient documentation

## 2018-02-15 DIAGNOSIS — R0609 Other forms of dyspnea: Secondary | ICD-10-CM | POA: Diagnosis not present

## 2018-02-15 DIAGNOSIS — R079 Chest pain, unspecified: Secondary | ICD-10-CM | POA: Insufficient documentation

## 2018-02-15 DIAGNOSIS — R42 Dizziness and giddiness: Secondary | ICD-10-CM | POA: Diagnosis not present

## 2018-02-15 DIAGNOSIS — I2510 Atherosclerotic heart disease of native coronary artery without angina pectoris: Secondary | ICD-10-CM | POA: Diagnosis not present

## 2018-02-15 DIAGNOSIS — I4510 Unspecified right bundle-branch block: Secondary | ICD-10-CM | POA: Diagnosis not present

## 2018-02-15 DIAGNOSIS — I081 Rheumatic disorders of both mitral and tricuspid valves: Secondary | ICD-10-CM | POA: Diagnosis not present

## 2018-02-15 LAB — MYOCARDIAL PERFUSION IMAGING
CHL CUP NUCLEAR SDS: 3
CHL CUP NUCLEAR SRS: 2
CHL CUP NUCLEAR SSS: 5
LHR: 0.26
LV dias vol: 102 mL (ref 62–150)
LV sys vol: 36 mL
NUC STRESS TID: 1
Peak HR: 61 {beats}/min
Rest HR: 44 {beats}/min

## 2018-02-15 LAB — BASIC METABOLIC PANEL
BUN / CREAT RATIO: 16 (ref 10–24)
BUN: 16 mg/dL (ref 8–27)
CHLORIDE: 106 mmol/L (ref 96–106)
CO2: 21 mmol/L (ref 20–29)
Calcium: 9.1 mg/dL (ref 8.6–10.2)
Creatinine, Ser: 0.97 mg/dL (ref 0.76–1.27)
GFR, EST AFRICAN AMERICAN: 84 mL/min/{1.73_m2} (ref >59)
GFR, EST NON AFRICAN AMERICAN: 73 mL/min/{1.73_m2} (ref >59)
Glucose: 137 mg/dL — ABNORMAL HIGH (ref 65–99)
POTASSIUM: 3.9 mmol/L (ref 3.5–5.2)
Sodium: 142 mmol/L (ref 134–144)

## 2018-02-15 MED ORDER — TECHNETIUM TC 99M TETROFOSMIN IV KIT
32.0000 | PACK | Freq: Once | INTRAVENOUS | Status: AC | PRN
  Administered 2018-02-15: 32 via INTRAVENOUS
  Filled 2018-02-15: qty 32

## 2018-02-15 MED ORDER — TECHNETIUM TC 99M TETROFOSMIN IV KIT
10.1000 | PACK | Freq: Once | INTRAVENOUS | Status: AC | PRN
  Administered 2018-02-15: 10.1 via INTRAVENOUS
  Filled 2018-02-15: qty 11

## 2018-02-15 MED ORDER — REGADENOSON 0.4 MG/5ML IV SOLN
0.4000 mg | Freq: Once | INTRAVENOUS | Status: AC
  Administered 2018-02-15: 0.4 mg via INTRAVENOUS

## 2018-02-15 NOTE — Telephone Encounter (Signed)
-----   Message from Liliane Shi, [place] sent at 02/15/2018  5:09 PM EDT ----- All values are normal or within acceptable limits.   Medication changes / Follow up labs / Other changes or recommendations:   - Continue current medications and follow up as planned.  Richardson Dopp, PA-C 02/15/2018 5:08 PM

## 2018-02-15 NOTE — Telephone Encounter (Signed)
Pt has been notified of all test results by phone with verbal understanding. I will forward results to PCP Dr. Eliezer Lofts. Pt thanked me for the call.

## 2018-02-21 ENCOUNTER — Telehealth: Payer: Self-pay | Admitting: *Deleted

## 2018-02-21 NOTE — Telephone Encounter (Signed)
Based on monitor results Richardson Dopp, PA would like pt to stop metoprolol.  I called pt and gave him this information.  Monitor to be reviewed with EP physician.  I told pt we would call him back with any additional recommendations.

## 2018-02-21 NOTE — Telephone Encounter (Signed)
Agree Richardson Dopp, PA-C    02/21/2018 5:36 PM

## 2018-02-21 NOTE — Telephone Encounter (Signed)
Per Richardson Dopp, PA pt should follow up with him as planned on 03/14/18. Pt should call if syncope, presyncope or dizziness prior to this appointment. I spoke with pt.  He has chronic lightheadedness especially upon changing positions. He report it is not bad and has not worsened.  I asked him to let us know if this worsens or he passes out or feels like he may pass out.  He is aware of follow up appointment on 03/14/18. Pt aware he should remain off metoprolol.

## 2018-03-07 ENCOUNTER — Ambulatory Visit (INDEPENDENT_AMBULATORY_CARE_PROVIDER_SITE_OTHER): Payer: Medicare HMO

## 2018-03-07 ENCOUNTER — Ambulatory Visit: Payer: Medicare HMO

## 2018-03-07 ENCOUNTER — Telehealth: Payer: Self-pay | Admitting: Family Medicine

## 2018-03-07 VITALS — BP 142/84 | HR 64 | Temp 98.4°F | Ht 67.5 in | Wt 182.2 lb

## 2018-03-07 DIAGNOSIS — E1142 Type 2 diabetes mellitus with diabetic polyneuropathy: Secondary | ICD-10-CM | POA: Diagnosis not present

## 2018-03-07 DIAGNOSIS — Z0000 Encounter for general adult medical examination without abnormal findings: Secondary | ICD-10-CM

## 2018-03-07 LAB — COMPREHENSIVE METABOLIC PANEL
ALBUMIN: 4.3 g/dL (ref 3.5–5.2)
ALK PHOS: 46 U/L (ref 39–117)
ALT: 13 U/L (ref 0–53)
AST: 17 U/L (ref 0–37)
BILIRUBIN TOTAL: 0.8 mg/dL (ref 0.2–1.2)
BUN: 21 mg/dL (ref 6–23)
CO2: 26 mEq/L (ref 19–32)
CREATININE: 1.05 mg/dL (ref 0.40–1.50)
Calcium: 9.2 mg/dL (ref 8.4–10.5)
Chloride: 107 mEq/L (ref 96–112)
GFR: 72.01 mL/min (ref >60.00)
GLUCOSE: 129 mg/dL — AB (ref 70–99)
POTASSIUM: 3.8 meq/L (ref 3.5–5.1)
Sodium: 141 mEq/L (ref 135–145)
Total Protein: 6.9 g/dL (ref 6.0–8.3)

## 2018-03-07 LAB — LIPID PANEL
CHOLESTEROL: 118 mg/dL (ref 0–200)
HDL: 36.6 mg/dL — ABNORMAL LOW (ref >39.00)
LDL Cholesterol: 60 mg/dL (ref 0–99)
NONHDL: 81.65
Total CHOL/HDL Ratio: 3
Triglycerides: 108 mg/dL (ref 0.0–149.0)
VLDL: 21.6 mg/dL (ref 0.0–40.0)

## 2018-03-07 LAB — HEMOGLOBIN A1C: HEMOGLOBIN A1C: 6.7 % — AB (ref 4.6–6.5)

## 2018-03-07 NOTE — Telephone Encounter (Signed)
-----   Message from Eustace Pen, LPN sent at 6/37/8588 12:25 PM EDT ----- Regarding: Labs 8/28 Lab orders needed. Thank you.  Insurance:  [employer]

## 2018-03-07 NOTE — Progress Notes (Signed)
I reviewed health advisor's note, was available for consultation, and agree with documentation and plan.   Signed,  Ekaterina Denise T. Justa Hatchell, MD  

## 2018-03-07 NOTE — Progress Notes (Signed)
Subjective:   [name] is a 83 y.o. male who presents for Medicare Annual/Subsequent preventive examination.  Review of Systems:  N/A Cardiac Risk Factors include: advanced age (>75men, >46 women);male gender;hypertension;obesity (BMI >30kg/m2);diabetes mellitus;dyslipidemia     Objective:    Vitals: BP (!) 142/84 (BP Location: Right Arm, Patient Position: Sitting, Cuff Size: Normal)   Pulse 64   Temp 98.4 F (36.9 C) (Oral)   Ht 5' 7.5" (1.715 m) Comment: shoes  Wt 182 lb 4 oz (82.7 kg)   SpO2 97%   BMI 28.12 kg/m   Body mass index is 28.12 kg/m.  Advanced Directives 03/07/2018 03/03/2017 04/20/2016 04/19/2016 03/31/2016 01/21/2016 10/31/2014  Does Patient Have a Medical Advance Directive? Yes Yes Yes Yes Yes Yes Yes  Type of Paramedic of [place];Living will Living will Living will;Healthcare Power of Attorney Living will;Healthcare Power of Attorney Living will;Healthcare Power of [place];Living will Living will  Does patient want to make changes to medical advance directive? - - No - Patient declined - No - Patient declined No - Patient declined -  Copy of [place] in Chart? No - copy requested - No - copy requested - No - copy requested No - copy requested No - copy requested  Pre-existing out of facility DNR order (yellow form or pink MOST form) - - - - - - -    Tobacco Social History   Tobacco Use  Smoking Status Former Smoker  Smokeless Tobacco Former Systems developer  Tobacco Comment   quit in 1985. smoked 1ppd for 35 years      Counseling given: No Comment: quit in 1985. smoked 1ppd for 35 years    Clinical Intake:  Pre-visit preparation completed: Yes  Pain : No/denies pain Pain Score: 0-No pain     Diabetes: Yes CBG done?: No Did pt. bring in CBG monitor from home?: No           Past Medical History:  Diagnosis Date  . CAD (coronary artery disease)    s/p stent mid LAD 2005  // s/p CABG 2016 // Myoview 8/19:  EF 64, no ischemia  . Coronary artery disease involving native coronary artery of native heart without angina pectoris   . Depression   . DJD (degenerative joint disease)   . DM type 2 with diabetic peripheral neuropathy ([place]) 03/10/2017  . E coli bacteremia 04/20/2016  . Essential hypertension 04/20/2016  . GERD (gastroesophageal reflux disease)   . History of echocardiogram    Echo 8/19: Moderate LVH, EF 60-65, normal wall motion, grade 1 diastolic dysfunction, mild MR, mild LAE, normal RVSF, mild TR, PASP 25  . History of hiatal hernia   . HTN (hypertension)   . Hyperlipidemia   . Neuropathy due to type 2 diabetes mellitus ([place]) 03/10/2017  . PERCUTANEOUS TRANSLUMINAL CORONARY ANGIOPLASTY, HX OF 11/26/2007   Annotation: With CYPHER STENT. Qualifier: Diagnosis of  By: [place], [place]    . Pre-diabetes   . S/P CABG x 4 11/03/2014  . S/P total knee replacement 04/11/2016  . Sepsis due to Escherichia coli (E. coli) ([place]) 04/20/2016  . Tinea pedis 09/08/2017   Past Surgical History:  Procedure Laterality Date  . BRAIN SURGERY    . CARDIAC CATHETERIZATION    . CORONARY ARTERY BYPASS GRAFT N/A 11/03/2014   Procedure: CORONARY ARTERY BYPASS GRAFTING (CABG), ON PUMP, TIMES FOUR, USING LEFT INTERNAL MAMMARY, RIGHT GREATER SAPHENOUS VEIN HARVESTED ENDOSCOPICALLY;  Surgeon: Collier Salina  Prescott Gum, MD;  Location: [facility];  Service: Open Heart Surgery;  Laterality: N/A;  LIMA-LAD; SVG-DIAG; SVG-OM; SVG-RCA  . CRANIOTOMY N/A 01/18/2014   Procedure: CRANIOTOMY HEMATOMA EVACUATION SUBDURAL;  Surgeon: Hosie Spangle, MD;  Location: [facility];  Service: Neurosurgery;  Laterality: N/A;  . kidney stone removal    . KNEE SURGERY    . LEFT HEART CATHETERIZATION WITH CORONARY ANGIOGRAM N/A 07/27/2011   Procedure: LEFT HEART CATHETERIZATION WITH CORONARY ANGIOGRAM;  Surgeon: Burnell Blanks, MD;  Location[facility] C[facility] LAB;  Service: Cardiovascular;  Laterality: N/A;  . LEFT HEART  CATHETERIZATION WITH CORONARY ANGIOGRAM N/A 10/31/2014   Procedure: LEFT HEART CATHETERIZATION WITH CORONARY ANGIOGRAM;  Surgeon: Sherren Mocha, MD;  Location: [facility] CATH LAB;  Service: Cardiovascular;  Laterality: N/A;  . PTCA     Hx of it.   Marland Kitchen SIGMOIDOSCOPY    . TEE WITHOUT CARDIOVERSION N/A 11/03/2014   Procedure: TRANSESOPHAGEAL ECHOCARDIOGRAM (TEE);  Surgeon: Ivin Poot, MD;  Location: [facility];  Service: Open Heart Surgery;  Laterality: N/A;  . TOTAL KNEE ARTHROPLASTY  02/2011   Right knee  . TOTAL KNEE ARTHROPLASTY Left 04/11/2016   Procedure: LEFT TOTAL KNEE ARTHROPLASTY;  Surgeon: Vickey Huger, MD;  Location: [facility];  Service: Orthopedics;  Laterality: Left;   Family History  Problem Relation Age of Onset  . Heart disease Father   . Heart attack Father   . Heart failure Mother   . COPD Mother   . Heart disease Sister   . Heart attack Sister   . Heart attack Brother    Social History   Socioeconomic History  . Marital status: Married    Spouse name: Not on file  . Number of children: Not on file  . Years of education: Not on file  . Highest education level: Not on file  Occupational History  . Not on file  Social Needs  . Financial resource strain: Not on file  . Food insecurity:    Worry: Not on file    Inability: Not on file  . Transportation needs:    Medical: Not on file    Non-medical: Not on file  Tobacco Use  . Smoking status: Former Research scientist (life sciences)  . Smokeless tobacco: Former Systems developer  . Tobacco comment: quit in 1985. smoked 1ppd for 35 years   Substance and Sexual Activity  . Alcohol use: No  . Drug use: No  . Sexual activity: Never  Lifestyle  . Physical activity:    Days per week: Not on file    Minutes per session: Not on file  . Stress: Not on file  Relationships  . Social connections:    Talks on phone: Not on file    Gets together: Not on file    Attends religious service: Not on file    Active member of club or organization: Not on file    Attends  meetings of clubs or organizations: Not on file    Relationship status: Not on file  Other Topics Concern  . Not on file  Social History Narrative   Married with children. 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 10:20 am    Has living will,  HCPOA: jerrie Jaffer, full code (reviewed 2015)    Outpatient Encounter Medications as of 03/07/2018  Medication Sig  . acetaminophen (TYLENOL) 500 MG tablet Take 500-1,000 mg by mouth 2 (two) times daily as needed (for pain).   Marland Kitchen clopidogrel (PLAVIX) 75 MG tablet  TAKE 1 TABLET BY MOUTH ONCE DAILY  . isosorbide mononitrate (IMDUR) 30 MG 24 hr tablet TAKE 1 TABLET BY MOUTH ONCE DAILY  . lisinopril (PRINIVIL,ZESTRIL) 20 MG tablet Take 1 tablet (20 mg total) by mouth daily.  . nitroGLYCERIN (NITROSTAT) 0.4 MG SL tablet Place 0.4 mg under the tongue every 5 (five) minutes as needed for chest pain.  . pantoprazole (PROTONIX) 40 MG tablet TAKE 1 TABLET BY MOUTH ONCE DAILY  . rosuvastatin (CRESTOR) 5 MG tablet Take 1 tablet (5 mg total) by mouth daily.   No facility-administered encounter medications on file as of 03/07/2018.     Activities of Daily Living In your present state of health, do you have any difficulty performing the following activities: 03/07/2018  Hearing? Y  Vision? N  Difficulty concentrating or making decisions? N  Walking or climbing stairs? N  Dressing or bathing? N  Doing errands, shopping? N  Preparing Food and eating ? N  Using the Toilet? N  In the past six months, have you accidently leaked urine? N  Do you have problems with loss of bowel control? N  Managing your Medications? N  Managing your Finances? N  Housekeeping or managing your Housekeeping? N  Some recent data might be hidden    Patient Care Team: Jinny Sanders, MD as PCP - General (Family Medicine) Burnell Blanks, MD as PCP - Cardiology (Cardiology) Rutherford Guys, MD as Consulting Physician (Ophthalmology)     Assessment:   This is a routine wellness examination for Ah[place].  Exercise Activities and Dietary recommendations Current Exercise Habits: Home exercise routine, Type of exercise: stretching, Time (Minutes): 25, Frequency (Times/Week): 7, Weekly Exercise (Minutes/Week): 175, Intensity: Mild, Exercise limited by: None identified  Goals    . Increase physical activity     Starting 03/07/2018, I will continue to stretch for 20-30 minutes daily.        Fall Risk Fall Risk  03/07/2018 03/03/2017 01/21/2016 12/06/2013  Falls in the past year? No No No Yes  Number falls in past yr: - - - 2 or more  Risk Factor Category  - - - High Fall Risk  Risk for fall due to : - - - Impaired balance/gait   Depression Screen PHQ 2/9 Scores 03/07/2018 03/03/2017 01/21/2016 12/06/2013  PHQ - 2 Score 0 0 0 0  PHQ- 9 Score 0 1 - -    Cognitive Function MMSE - Mini Mental State Exam 03/07/2018 03/03/2017 01/21/2016  Orientation to time 5 5 5   Orientation to Place 5 5 5   Registration 3 3 3   Attention/ Calculation 0 0 0  Recall 3 3 3   Language- name 2 objects 0 0 0  Language- repeat 1 1 1   Language- follow 3 step command 3 3 3   Language- read & follow direction 0 0 0  Write a sentence 0 0 0  Copy design 0 0 0  Total score 20 20 20      PLEASE NOTE: A Mini-Cog screen was completed. Maximum score is 20. A value of 0 denotes this part of [place] MMSE was not completed or the patient failed this part of the Mini-Cog screening.   Mini-Cog Screening Orientation to Time - Max 5 pts Orientation to Place - Max 5 pts Registration - Max 3 pts Recall - Max 3 pts Language Repeat - Max 1 pts Language Follow 3 Step Command - Max 3 pts     Immunization History  Administered Date(s) Administered  . Pneumococcal Conjugate-13 01/21/2016  .  Pneumococcal Polysaccharide-23 06/19/2013    Screening Tests Health Maintenance  Topic Date Due  . OPHTHALMOLOGY EXAM  08/11/2018 (Originally 08/25/2017)  . INFLUENZA VACCINE   10/10/2018 (Originally 02/08/2018)  . TETANUS/TDAP  03/04/2027 (Originally 12/25/1953)  . HEMOGLOBIN A1C  09/07/2018  . FOOT EXAM  09/09/2018  . PNA vac Low Risk Adult  Completed     Plan:     I have personally reviewed, addressed, and noted the following in the patient's chart:  A. Medical and social history B. Use of alcohol, tobacco or illicit drugs  C. Current medications and supplements D. Functional ability and status E.  Nutritional status F.  Physical activity G. Advance directives H. List of other physicians I.  Hospitalizations, surgeries, and ER visits in previous 12 months J.  [place] to include hearing, vision, cognitive, depression L. Referrals and appointments - none  In addition, I have reviewed and discussed with patient certain preventive protocols, quality metrics, and best practice recommendations. A written personalized care plan for preventive services as well as general preventive health recommendations were provided to patient.  See attached scanned questionnaire for additional information.   Signed,   Lindell Noe, MHA, BS, LPN Health Coach

## 2018-03-07 NOTE — Patient Instructions (Signed)
[name] , Thank you for taking time to come for your Medicare Wellness Visit. I appreciate your ongoing commitment to your health goals. Please review the following plan we discussed and let me know if I can assist you in the future.   These are the goals we discussed: Goals    . Increase physical activity     Starting 03/07/2018, I will continue to stretch for 20-30 minutes daily.        This is a list of the screening recommended for you and due dates:  Health Maintenance  Topic Date Due  . Eye exam for diabetics  08/11/2018*  . Flu Shot  10/10/2018*  . Tetanus Vaccine  03/04/2027*  . Hemoglobin A1C  09/07/2018  . Complete foot exam   09/09/2018  . Pneumonia vaccines  Completed  *Topic was postponed. The date shown is not the original due date.   Preventive Care for Adults  A healthy lifestyle and preventive care can promote health and wellness. Preventive health guidelines for adults include the following key practices.  . A routine yearly physical is a good way to check with your health care provider about your health and preventive screening. It is a chance to share any concerns and updates on your health and to receive a thorough exam.  . Visit your dentist for a routine exam and preventive care every 6 months. Brush your teeth twice a day and floss once a day. Good oral hygiene prevents tooth decay and gum disease.  . The frequency of eye exams is based on your age, health, family medical history, use  of contact lenses, and other factors. Follow your health care provider's recommendations for frequency of eye exams.  . Eat a healthy diet. Foods like vegetables, fruits, whole grains, low-fat dairy products, and lean protein foods contain the nutrients you need without too many calories. Decrease your intake of foods high in solid fats, added sugars, and salt. Eat the right amount of calories for you. Get information about a proper diet from your health care provider, if  necessary.  . Regular physical exercise is one of the most important things you can do for your health. Most adults should get at least 150 minutes of moderate-intensity exercise (any activity that increases your heart rate and causes you to sweat) each week. In addition, most adults need muscle-strengthening exercises on 2 or more days a week.  Silver Sneakers may be a benefit available to you. To determine eligibility, you may visit the website: www.silversneakers.com or contact program at 762-373-3407 Mon-Fri between 8AM-8PM.   . Maintain a healthy weight. The body mass index (BMI) is a screening tool to identify possible weight problems. It provides an estimate of body fat based on height and weight. Your health care provider can find your BMI and can help you achieve or maintain a healthy weight.   For adults 20 years and older: ? A BMI below 18.5 is considered underweight. ? A BMI of 18.5 to 24.9 is normal. ? A BMI of 25 to 29.9 is considered overweight. ? A BMI of 30 and above is considered obese.   . Maintain normal blood lipids and cholesterol levels by exercising and minimizing your intake of saturated fat. Eat a balanced diet with plenty of fruit and vegetables. Blood tests for lipids and cholesterol should begin at age 45 and be repeated every 5 years. If your lipid or cholesterol levels are high, you are over 50, or you are at high  risk for heart disease, you may need your cholesterol levels checked more frequently. Ongoing high lipid and cholesterol levels should be treated with medicines if diet and exercise are not working.  . If you smoke, find out from your health care provider how to quit. If you do not use tobacco, please do not start.  . If you choose to drink alcohol, please do not consume more than 2 drinks per day. One drink is considered to be 12 ounces (355 mL) of beer, 5 ounces (148 mL) of wine, or 1.5 ounces (44 mL) of liquor.  . If you are 10-62 years old, ask your  health care provider if you should take aspirin to prevent strokes.  . Use sunscreen. Apply sunscreen liberally and repeatedly throughout the day. You should seek shade when your shadow is shorter than you. Protect yourself by wearing long sleeves, pants, a wide-brimmed hat, and sunglasses year round, whenever you are outdoors.  . Once a month, do a whole body skin exam, using a mirror to look at the skin on your back. Tell your health care provider of new moles, moles that have irregular borders, moles that are larger than a pencil eraser, or moles that have changed in shape or color.

## 2018-03-07 NOTE — Progress Notes (Signed)
PCP notes:   Health maintenance:  Eye exam - adddressed Flu Vaccine - addressed A1C - completed  Abnormal screenings:   None  Patient concerns:   None  Nurse concerns:  None  Next PCP appt:   03/16/18 @ 0930

## 2018-03-09 ENCOUNTER — Encounter: Payer: Medicare HMO | Admitting: Family Medicine

## 2018-03-14 ENCOUNTER — Encounter: Payer: Self-pay | Admitting: Physician Assistant

## 2018-03-14 ENCOUNTER — Ambulatory Visit: Payer: Medicare HMO | Admitting: Physician Assistant

## 2018-03-14 VITALS — BP 142/84 | HR 68 | Ht 67.0 in | Wt 184.0 lb

## 2018-03-14 DIAGNOSIS — I2510 Atherosclerotic heart disease of native coronary artery without angina pectoris: Secondary | ICD-10-CM

## 2018-03-14 DIAGNOSIS — I10 Essential (primary) hypertension: Secondary | ICD-10-CM | POA: Diagnosis not present

## 2018-03-14 DIAGNOSIS — R001 Bradycardia, unspecified: Secondary | ICD-10-CM | POA: Diagnosis not present

## 2018-03-14 DIAGNOSIS — R0683 Snoring: Secondary | ICD-10-CM | POA: Diagnosis not present

## 2018-03-14 DIAGNOSIS — E7800 Pure hypercholesterolemia, unspecified: Secondary | ICD-10-CM

## 2018-03-14 NOTE — Progress Notes (Signed)
Cardiology Office Note:    Date:  03/14/2018   ID:  [name], DOB 22-Apr-1935, MRN [id number]  PCP:  Jinny Sanders, MD  Cardiologist:  Lauree Chandler, MD   Referring MD: Jinny Sanders, MD   Chief Complaint  Patient presents with  . Follow-up    bradycardia, CAD    History of Present Illness:    [name] is a 83 y.o. male with coronary artery disease s/p drug eluting stent to the mid LAD in 2005 and CABG and 4/16 (LIMA-LAD, SVG-DX, SVG-PDA, SVG-OM), hypertension, hyperlipidemia, diabetes.  He had a traumatic subdural hematoma in 2015.  He is intolerant to statins.  He was last seen 02/07/18 for chest pain, palpitations and uncontrolled blood pressure.  Nuclear stress test was obtained and demonstrated no ischemia.  An echocardiogram demonstrated normal LV function with mild diastolic dysfunction.  Holter monitor did demonstrate episodes of type II second-degree AV block and junctional bradycardia with frequent PACs.  His beta-blocker was stopped.  [name] returns for follow-up.  He is here with his wife.  Since stopping metoprolol, he feels better.  He has chronic dizziness that is mainly related to positional changes.  He feels like he loses his balance.  He questions if this is been present since his subdural hematoma.  He denies any spinning sensation.  He denies syncope or near syncope.  He denies chest discomfort shortness of breath.  He denies orthopnea, PND or edema.  He still has occasional palpitations.  His wife notes that he has what appears to be apneic episodes when he sleeps.  He does admit to daytime hypersomnolence.  CHADS2-VASc=5 (age x 2, HTN, CAD, DM).    Prior CV studies:   The following studies were reviewed today:  Echo 02/07/18 Moderate LVH, EF 60-65, normal wall motion, grade 1 diastolic dysfunction, trivial AI, mild MR, mild LAE, mild TR, PASP 25  48 Hr Holter 02/07/18 Type 2 second degree AV block Junctional bradycardia.  Frequent Premature  atrial contractions (3898 during monitoring period) Rare premature ventricular contractions   Nuclear stress test 01/10/18  Nuclear stress EF: 64% with septal wall hypokinesis  There was no ST segment deviation noted during stress.  The study is normal.  This is a low risk study. No ischemia identified.  Echo 10/15/2015 Moderate concentric LVH, EF 60-65, normal wall motion, grade 2 diastolic dysfunction, severe LAE, mild RV dilation, mild RAE   LHC 10/31/14 LM: distal left main has severe 95% stenosis at the bifurcation of the LAD and left circumflex. LAD: severely diseased vessel. The stented segment mid LAD has mild diffuse in-stent restenosis. Prox 30-40%. Diffuse 80% mid LAD  LCx: Prox 20-30%, mid 50% stenosis.  RCA: Ostial 50%, mid 50%, mid PDA 50-60% stenosis. LVEF is estimated at 65%, there is no significant mitral regurgitation   Echo 11/01/14 - Mild LVH. EF 60% to 65%.Wall motion was normal; there were no regional wall motionabnormalities. - Left atrium: The atrium was mildly to moderately dilated.  Past Medical History:  Diagnosis Date  . CAD (coronary artery disease)    s/p stent mid LAD 2005 // s/p CABG 2016 // Myoview 8/19:  EF 64, no ischemia  . Coronary artery disease involving native coronary artery of native heart without angina pectoris   . Depression   . DJD (degenerative joint disease)   . DM type 2 with diabetic peripheral neuropathy ([place]) 03/10/2017  . E coli bacteremia 04/20/2016  . Essential hypertension 04/20/2016  .  GERD (gastroesophageal reflux disease)   . History of echocardiogram    Echo 8/19: Moderate LVH, EF 60-65, normal wall motion, grade 1 diastolic dysfunction, mild MR, mild LAE, normal RVSF, mild TR, PASP 25  . History of hiatal hernia   . HTN (hypertension)   . Hyperlipidemia   . Neuropathy due to type 2 diabetes mellitus ([place]) 03/10/2017  . PERCUTANEOUS TRANSLUMINAL CORONARY ANGIOPLASTY, HX OF 11/26/2007   Annotation: With CYPHER  STENT. Qualifier: Diagnosis of  By: [place], [place]    . Pre-diabetes   . S/P CABG x 4 11/03/2014  . S/P total knee replacement 04/11/2016  . Sepsis due to Escherichia coli (E. coli) ([place]) 04/20/2016  . Tinea pedis 09/08/2017   Surgical Hx: The patient  has a past surgical history that includes Mitral valve replacement; Knee surgery; kidney stone removal; Sigmoidoscopy; Total knee arthroplasty (02/2011); Craniotomy (N/A, 01/18/2014); left heart catheterization with coronary angiogram (N/A, 07/27/2011); Cardiac catheterization; Brain surgery; left heart catheterization with coronary angiogram (N/A, 10/31/2014); Coronary artery bypass graft (N/A, 11/03/2014); TEE without cardioversion (N/A, 11/03/2014); and Total knee arthroplasty (Left, 04/11/2016).   Current Medications: Current Meds  Medication Sig  . acetaminophen (TYLENOL) 500 MG tablet Take 500-1,000 mg by mouth 2 (two) times daily as needed (for pain).   Marland Kitchen clopidogrel (PLAVIX) 75 MG tablet TAKE 1 TABLET BY MOUTH ONCE DAILY  . isosorbide mononitrate (IMDUR) 30 MG 24 hr tablet TAKE 1 TABLET BY MOUTH ONCE DAILY  . lisinopril (PRINIVIL,ZESTRIL) 20 MG tablet Take 1 tablet (20 mg total) by mouth daily.  . nitroGLYCERIN (NITROSTAT) 0.4 MG SL tablet Place 0.4 mg under the tongue every 5 (five) minutes as needed for chest pain.  . pantoprazole (PROTONIX) 40 MG tablet TAKE 1 TABLET BY MOUTH ONCE DAILY     Allergies:   Protamine; Statins; Rosuvastatin calcium; and Imodium [loperamide]   Social History   Tobacco Use  . Smoking status: Former Research scientist (life sciences)  . Smokeless tobacco: Former Systems developer  . Tobacco comment: quit in 1985. smoked 1ppd for 35 years   Substance Use Topics  . Alcohol use: No  . Drug use: No     Family Hx: The patient's family history includes COPD in his mother; Heart attack in his brother, father, and sister; Heart disease in his father and sister; Heart failure in his mother.  ROS:   Please see the history of present illness.      Review of Systems  Neurological: Positive for dizziness.   All other systems reviewed and are negative.   EKGs/Labs/Other Test Reviewed:    EKG:  EKG is  ordered today.  The ekg ordered today demonstrates normal sinus rhythm, heart rate 68, leftward axis, right bundle branch block, PR 166, QRS 132, similar to old EKG  Recent Labs: 03/07/2018: ALT 13; BUN 21; Creatinine, Ser 1.05; Potassium 3.8; Sodium 141   Recent Lipid Panel Lab Results  Component Value Date/Time   CHOL 118 03/07/2018 09:16 AM   CHOL 122 09/22/2016 09:12 AM   TRIG 108.0 03/07/2018 09:16 AM   HDL 36.60 (L) 03/07/2018 09:16 AM   HDL 39 (L) 09/22/2016 09:12 AM   CHOLHDL 3 03/07/2018 09:16 AM   LDLCALC 60 03/07/2018 09:16 AM   LDLCALC 63 09/22/2016 09:12 AM    Physical Exam:    VS:  BP (!) 142/84   Pulse 68   Ht 5\' 7"  (1.702 m)   Wt 184 lb (83.5 kg)   BMI 28.82 kg/m     Wt Readings  from Last 3 Encounters:  03/14/18 184 lb (83.5 kg)  03/07/18 182 lb 4 oz (82.7 kg)  02/15/18 184 lb (83.5 kg)     Physical Exam  Constitutional: He is oriented to person, place, and time. He appears well-developed and well-nourished. No distress.  HENT:  Head: Normocephalic and atraumatic.  Eyes: No scleral icterus.  Neck: No JVD present. No thyromegaly present.  Cardiovascular: Normal rate and regular rhythm.  No murmur heard. Pulmonary/Chest: Effort normal. He has no rales.  Abdominal: Soft.  Musculoskeletal: He exhibits no edema.  Lymphadenopathy:    He has no cervical adenopathy.  Neurological: He is alert and oriented to person, place, and time.  Skin: Skin is warm and dry.  Psychiatric: He has a normal mood and affect.    ASSESSMENT & PLAN:    Bradycardia He did have recent second-degree AV block type II on Holter monitor as well as junctional bradycardia.  This was in the setting of beta-blocker therapy.  His beta-blocker has been stopped since that time.  He feels that he has more energy.  He denies any  syncope or near syncope.  He does have chronic dizziness related to old head injury.  This does not seem to have changed.  No further testing is indicated at this time.  If he has episodes of syncope or near syncope in the future, he will need to monitor possible referral to EP.  Coronary artery disease involving native coronary artery of native heart without angina pectoris Hx of prior DES to the LAD in 2005 and CABG in 2016.  Recent nuclear stress test was low risk.  He denies anginal symptoms.  Continue Plavix, nitrates, Crestor.  Essential hypertension Blood pressure somewhat above target.  Given his chronic history of dizziness and poor balance, I will not make any further adjustments in his medical therapy at this time.  Pure hypercholesterolemia LDL optimal on most recent lab work.  Continue current Rx.    Snoring His wife notes a history of snoring which is fairly mild.  However, she has noted apneic episodes in the past.  We discussed possible home sleep study.  He would like to hold off on this for now.   Dispo:  Return in about 3 months (around 06/13/2018) for Routine Follow Up w/ Dr. Angelena Form.   Medication Adjustments/Labs and Tests Ordered: Current medicines are reviewed at length with the patient today.  Concerns regarding medicines are outlined above.  Tests Ordered: Orders Placed This Encounter  Procedures  . EKG 12-Lead   Medication Changes: No orders of the defined types were placed in this encounter.   Signed, Richardson Dopp, PA-C  03/14/2018 5:04 PM    [facility] [address], [place], [place]  [address] Phone: [phone]; Fax: [phone]

## 2018-03-14 NOTE — Patient Instructions (Addendum)
Medication Instructions:  1. Your physician recommends that you continue on your current medications as directed. Please refer to the Current Medication list given to you today.   Labwork: NONE ORDERED TODAY  Testing/Procedures: NONE ORDERED TODAY  Follow-Up: 06/13/18 @ 10 AM WITH DR. Angelena Form   Any Other Special Instructions Will Be Listed Below (If Applicable).  MONITOR BLOOD PRESSURE AND BRING READINGS TO NEXT APPT 06/13/18   If you need a refill on your cardiac medications before your next appointment, please call your pharmacy.

## 2018-03-16 ENCOUNTER — Ambulatory Visit (INDEPENDENT_AMBULATORY_CARE_PROVIDER_SITE_OTHER): Payer: Medicare HMO | Admitting: Family Medicine

## 2018-03-16 ENCOUNTER — Encounter: Payer: Self-pay | Admitting: Family Medicine

## 2018-03-16 VITALS — BP 143/71 | HR 76 | Temp 98.6°F | Ht 67.5 in | Wt 184.0 lb

## 2018-03-16 DIAGNOSIS — G5601 Carpal tunnel syndrome, right upper limb: Secondary | ICD-10-CM | POA: Diagnosis not present

## 2018-03-16 DIAGNOSIS — E1142 Type 2 diabetes mellitus with diabetic polyneuropathy: Secondary | ICD-10-CM | POA: Diagnosis not present

## 2018-03-16 DIAGNOSIS — I10 Essential (primary) hypertension: Secondary | ICD-10-CM | POA: Diagnosis not present

## 2018-03-16 DIAGNOSIS — Z0000 Encounter for general adult medical examination without abnormal findings: Secondary | ICD-10-CM | POA: Diagnosis not present

## 2018-03-16 DIAGNOSIS — I2510 Atherosclerotic heart disease of native coronary artery without angina pectoris: Secondary | ICD-10-CM

## 2018-03-16 DIAGNOSIS — E782 Mixed hyperlipidemia: Secondary | ICD-10-CM

## 2018-03-16 DIAGNOSIS — E1140 Type 2 diabetes mellitus with diabetic neuropathy, unspecified: Secondary | ICD-10-CM

## 2018-03-16 LAB — HM DIABETES FOOT EXAM

## 2018-03-16 NOTE — Assessment & Plan Note (Signed)
Good control with diet.

## 2018-03-16 NOTE — Assessment & Plan Note (Signed)
Well controlled. Continue current medication.  

## 2018-03-16 NOTE — Assessment & Plan Note (Signed)
Trial of right carpal tunnel brace at night.

## 2018-03-16 NOTE — Assessment & Plan Note (Signed)
Due to DM. Stable, no pain.

## 2018-03-16 NOTE — Progress Notes (Addendum)
Subjective:    Patient ID: [name], male    DOB: [birth year], 83 y.o.   MRN: [id number]  HPI The patient presents for  complete physical and review of chronic health problems. He/She also has the following acute concerns today: finger numbness  The patient saw Candis Musa, LPN for medicare wellness. Note reviewed in detail and important notes copied below. Health maintenance:  Eye exam - adddressed Flu Vaccine - addressed A1C - completed  Abnormal screenings:  None  Patient concerns:  None   Today 03/16/18 Hypertension:   Borderline control in office on lisinopril, imdur. No longer on metoprolol given caused bradycardia.. Dizziness not much better BP Readings from Last 3 Encounters:  03/16/18 (!) 143/71  03/14/18 (!) 142/84  03/07/18 (!) 142/84  Using medication without problems or lightheadedness: none Chest pain with exertion:none Edema:none Short of breath:none Average home BPs: Other issues: CAD Dr. Glennie Hawk Cardiologist for CAD, S/P CABG, angioplasty, on plavix  Dr. Gershon Crane Opthamologist ORTHO Dr. Ronnie Derby 2017 left TKR  Diabetes:  Good control with diet. Lab Results  Component Value Date   HGBA1C 6.7 (H) 03/07/2018  Using medications without difficulties: Hypoglycemic episodes: none Hyperglycemic episodes:none Feet problems: no ucler Blood Sugars averaging:  occ eye exam within last year:  neuropathy associated... tolerable  LDL cholesterol at goal < 70 .Marland Kitchen On crestor   numbness in third digit on right hand, occ in thumb.. Read nostop all day holding tablet. Social History /Family History/Past Medical History reviewed in detail and updated in EMR if needed. Blood pressure (!) 143/71, pulse 76, temperature 98.6 F (37 C), temperature source Oral, height 5' 7.5" (1.715 m), weight 184 lb (83.5 kg).   Review of Systems  Constitutional: Negative for fatigue and fever.  HENT: Negative for ear pain.   Eyes: Negative for pain.  Respiratory:  Negative for cough and shortness of breath.   Cardiovascular: Negative for chest pain, palpitations and leg swelling.  Gastrointestinal: Negative for abdominal pain.  Genitourinary: Negative for dysuria.  Musculoskeletal: Negative for arthralgias.  Neurological: Negative for syncope, light-headedness and headaches.  Psychiatric/Behavioral: Negative for dysphoric mood.       Objective:   Physical Exam  Constitutional: He appears well-developed and well-nourished.  Non-toxic appearance. He does not appear ill. No distress.  HENT:  Head: Normocephalic and atraumatic.  Right Ear: Hearing, tympanic membrane, external ear and ear canal normal.  Left Ear: Hearing, tympanic membrane, external ear and ear canal normal.  Nose: Nose normal.  Mouth/Throat: Uvula is midline, oropharynx is clear and moist and mucous membranes are normal.  Eyes: Pupils are equal, round, and reactive to light. Conjunctivae, EOM and lids are normal. Lids are everted and swept, no foreign bodies found.  Neck: Trachea normal, normal range of motion and phonation normal. Neck supple. Carotid bruit is not present. No thyroid mass and no thyromegaly present.  Cardiovascular: Normal rate, regular rhythm, S1 normal, S2 normal, intact distal pulses and normal pulses. Exam reveals no gallop.  No murmur heard. Pulmonary/Chest: Breath sounds normal. He has no wheezes. He has no rhonchi. He has no rales.  Abdominal: Soft. Normal appearance and bowel sounds are normal. There is no hepatosplenomegaly. There is no tenderness. There is no rebound, no guarding and no CVA tenderness. No hernia.  Lymphadenopathy:    He has no cervical adenopathy.  Neurological: He is alert. He has normal strength and normal reflexes. No cranial nerve deficit or sensory deficit. Gait normal.  Skin: Skin is warm, dry and  intact. No rash noted.  Psychiatric: He has a normal mood and affect. His speech is normal and behavior is normal. Judgment normal.      Positive tinel and phalen on right wrist Diabetic foot exam: Normal inspection No skin breakdown No calluses  Normal DP pulses Decreased nsation to light touch and monofilament in toes bilaterally. Nails normal      Assessment & Plan:  The patient's preventative maintenance and recommended screening tests for an annual wellness exam were reviewed in full today. Brought up to date unless services declined.  Counselled on the importance of diet, exercise, and its role in overall health and mortality. The patient's FH and SH was reviewed, including their home life, tobacco status, and drug and alcohol status.   Vaccines: UPTODATE except refused flu.  colonoscopy not indicated  former smoker, remote

## 2018-03-16 NOTE — Patient Instructions (Signed)
Trail of right carpal tunnel brace at night.  Keep up the great  with healthy

## 2018-04-19 ENCOUNTER — Other Ambulatory Visit: Payer: Self-pay | Admitting: Cardiovascular Disease

## 2018-05-04 ENCOUNTER — Other Ambulatory Visit: Payer: Self-pay | Admitting: Cardiovascular Disease

## 2018-05-23 DIAGNOSIS — X32XXXA Exposure to sunlight, initial encounter: Secondary | ICD-10-CM | POA: Diagnosis not present

## 2018-05-23 DIAGNOSIS — L821 Other seborrheic keratosis: Secondary | ICD-10-CM | POA: Diagnosis not present

## 2018-05-23 DIAGNOSIS — L570 Actinic keratosis: Secondary | ICD-10-CM | POA: Diagnosis not present

## 2018-06-01 ENCOUNTER — Other Ambulatory Visit: Payer: Self-pay | Admitting: Family Medicine

## 2018-06-13 ENCOUNTER — Ambulatory Visit: Payer: Medicare HMO | Admitting: Cardiovascular Disease

## 2018-06-13 ENCOUNTER — Encounter: Payer: Self-pay | Admitting: Cardiovascular Disease

## 2018-06-13 VITALS — BP 144/84 | HR 88 | Ht 67.5 in | Wt 186.4 lb

## 2018-06-13 DIAGNOSIS — R001 Bradycardia, unspecified: Secondary | ICD-10-CM

## 2018-06-13 DIAGNOSIS — I2510 Atherosclerotic heart disease of native coronary artery without angina pectoris: Secondary | ICD-10-CM

## 2018-06-13 DIAGNOSIS — E7800 Pure hypercholesterolemia, unspecified: Secondary | ICD-10-CM

## 2018-06-13 DIAGNOSIS — I10 Essential (primary) hypertension: Secondary | ICD-10-CM

## 2018-06-13 NOTE — Patient Instructions (Signed)

## 2018-06-13 NOTE — Progress Notes (Signed)
Chief Complaint  Patient presents with  . Follow-up    CAD     History of Present Illness: 83 yo male with history of CAD s/p CABG 2016, prior coronary stent placement before his CABG, HTN, hyperlipidemia and GERD who presents today for cardiac follow up. He has been followed for CAD since 2005 when he had a LAD stent placed. He has not tolerated statins in the past secondary to myalgias. He was evaluated April 2016 for chest pain. Cardiac catheterization 10/31/14 with multivessel disease with severe 95% distal left main stenosis. He underwent 4V CABG (LIMA-LAD, SVG-DX, SVG-PDA, SVG-OM) April 2016. Echo April 2017 with normal LV function and no significant valvular disease. He was seen in July 2019 for chest pain, palpitations and uncontrolled blood pressure. Nuclear stress test July 2019 with no ischemia. Echo July 2019 with LVEF=60-65% with grade 1 diastolic dysfunction. Mild MR. Cardiac monitor July 2019 with type 2 second degree AV block and junctional bradycardia with frequent PACs. HIs beta blocker was stopped. He felt better after stopping the beta blocker.   He is here today for follow up. The patient denies any chest pain, dyspnea, palpitations, lower extremity edema, orthopnea, PND, near syncope or syncope. He has rare dizziness.    Primary Care Physician: Jinny Sanders, MD  Past Medical History:  Diagnosis Date  . CAD (coronary artery disease)    s/p stent mid LAD 2005 // s/p CABG 2016 // Myoview 8/19:  EF 64, no ischemia  . Coronary artery disease involving native coronary artery of native heart without angina pectoris   . Depression   . DJD (degenerative joint disease)   . DM type 2 with diabetic peripheral neuropathy ([place]) 03/10/2017  . E coli bacteremia 04/20/2016  . Essential hypertension 04/20/2016  . GERD (gastroesophageal reflux disease)   . History of echocardiogram    Echo 8/19: Moderate LVH, EF 60-65, normal wall motion, grade 1 diastolic dysfunction, mild MR, mild  LAE, normal RVSF, mild TR, PASP 25  . History of hiatal hernia   . HTN (hypertension)   . Hyperlipidemia   . Neuropathy due to type 2 diabetes mellitus ([place]) 03/10/2017  . PERCUTANEOUS TRANSLUMINAL CORONARY ANGIOPLASTY, HX OF 11/26/2007   Annotation: With CYPHER STENT. Qualifier: Diagnosis of  By: [place], [place]    . Pre-diabetes   . S/P CABG x 4 11/03/2014  . S/P total knee replacement 04/11/2016  . Sepsis due to Escherichia coli (E. coli) ([place]) 04/20/2016  . Tinea pedis 09/08/2017    Past Surgical History:  Procedure Laterality Date  . BRAIN SURGERY    . CARDIAC CATHETERIZATION    . CORONARY ARTERY BYPASS GRAFT N/A 11/03/2014   Procedure: CORONARY ARTERY BYPASS GRAFTING (CABG), ON PUMP, TIMES FOUR, USING LEFT INTERNAL MAMMARY, RIGHT GREATER SAPHENOUS VEIN HARVESTED ENDOSCOPICALLY;  Surgeon: Ivin Poot, MD;  Location: [facility];  Service: Open Heart Surgery;  Laterality: N/A;  LIMA-LAD; SVG-DIAG; SVG-OM; SVG-RCA  . CRANIOTOMY N/A 01/18/2014   Procedure: CRANIOTOMY HEMATOMA EVACUATION SUBDURAL;  Surgeon: Hosie Spangle, MD;  Location: [facility];  Service: Neurosurgery;  Laterality: N/A;  . kidney stone removal    . KNEE SURGERY    . LEFT HEART CATHETERIZATION WITH CORONARY ANGIOGRAM N/A 07/27/2011   Procedure: LEFT HEART CATHETERIZATION WITH CORONARY ANGIOGRAM;  Surgeon: Burnell Blanks, MD;  Location: [facility] CATH LAB;  Service: Cardiovascular;  Laterality: N/A;  . LEFT HEART CATHETERIZATION WITH CORONARY ANGIOGRAM N/A 10/31/2014   Procedure: LEFT HEART CATHETERIZATION WITH  CORONARY ANGIOGRAM;  Surgeon: Sherren Mocha, MD;  Location: [facility] CATH LAB;  Service: Cardiovascular;  Laterality: N/A;  . PTCA     Hx of it.   Marland Kitchen SIGMOIDOSCOPY    . TEE WITHOUT CARDIOVERSION N/A 11/03/2014   Procedure: TRANSESOPHAGEAL ECHOCARDIOGRAM (TEE);  Surgeon: Ivin Poot, MD;  Location: [facility];  Service: Open Heart Surgery;  Laterality: N/A;  . TOTAL KNEE ARTHROPLASTY  02/2011   Right knee  . TOTAL KNEE  ARTHROPLASTY Left 04/11/2016   Procedure: LEFT TOTAL KNEE ARTHROPLASTY;  Surgeon: Vickey Huger, MD;  Location: [facility];  Service: Orthopedics;  Laterality: Left;    Current Outpatient Medications  Medication Sig Dispense Refill  . acetaminophen (TYLENOL) 500 MG tablet Take 500-1,000 mg by mouth 2 (two) times daily as needed (for pain).     Marland Kitchen clopidogrel (PLAVIX) 75 MG tablet TAKE 1 TABLET BY MOUTH ONCE DAILY 90 tablet 3  . isosorbide mononitrate (IMDUR) 30 MG 24 hr tablet TAKE 1 TABLET BY MOUTH ONCE DAILY 90 tablet 3  . nitroGLYCERIN (NITROSTAT) 0.4 MG SL tablet Place 0.4 mg under the tongue every 5 (five) minutes as needed for chest pain.    . pantoprazole (PROTONIX) 40 MG tablet TAKE 1 TABLET BY MOUTH ONCE DAILY 90 tablet 1  . lisinopril (PRINIVIL,ZESTRIL) 20 MG tablet Take 1 tablet (20 mg total) by mouth daily. 90 tablet 3  . rosuvastatin (CRESTOR) 5 MG tablet Take 1 tablet (5 mg total) by mouth daily. 90 tablet 2   No current facility-administered medications for this visit.     Allergies  Allergen Reactions  . Protamine Rash    Hypotension, Rash, unstable vitals.    . Statins Other (See Comments)    Caused soreness (Lipitor, Zocor)  . Rosuvastatin Calcium Other (See Comments)    Caused soreness (Lipitor, Zocor)  . Imodium [Loperamide] Itching and Rash    Social History   Socioeconomic History  . Marital status: Married    Spouse name: Not on file  . Number of children: Not on file  . Years of education: Not on file  . Highest education level: Not on file  Occupational History  . Not on file  Social Needs  . Financial resource strain: Not on file  . Food insecurity:    Worry: Not on file    Inability: Not on file  . Transportation needs:    Medical: Not on file    Non-medical: Not on file  Tobacco Use  . Smoking status: Former Research scientist (life sciences)  . Smokeless tobacco: Former Systems developer  . Tobacco comment: quit in 1985. smoked 1ppd for 35 years   Substance and Sexual Activity  .  Alcohol use: No  . Drug use: No  . Sexual activity: Never  Lifestyle  . Physical activity:    Days per week: Not on file    Minutes per session: Not on file  . Stress: Not on file  Relationships  . Social connections:    Talks on phone: Not on file    Gets together: Not on file    Attends religious service: Not on file    Active member of club or organization: Not on file    Attends meetings of clubs or organizations: Not on file    Relationship status: Not on file  . Intimate partner violence:    Fear of current or ex partner: Not on file    Emotionally abused: Not on file    Physically abused: Not on file  Forced sexual activity: Not on file  Other Topics Concern  . Not on file  Social History Narrative   Married with children. 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 10:20 am    Has living will,  HCPOA: jerrie Sohm, full code (reviewed 11)    Family History  Problem Relation Age of Onset  . Heart disease Father   . Heart attack Father   . Heart failure Mother   . COPD Mother   . Heart disease Sister   . Heart attack Sister   . Heart attack Brother     Review of Systems:  As stated in the HPI and otherwise negative.   BP (!) 144/84   Pulse 88   Ht 5' 7.5" (1.715 m)   Wt 186 lb 6.4 oz (84.6 kg)   SpO2 96%   BMI 28.76 kg/m   Physical Examination:  General: Well developed, well nourished, NAD  HEENT: OP clear, mucus membranes moist  SKIN: warm, dry. No rashes. Neuro: No focal deficits  Musculoskeletal: Muscle strength 5/5 all ext  Psychiatric: Mood and affect normal  Neck: No JVD, no carotid bruits, no thyromegaly, no lymphadenopathy.  Lungs:Clear bilaterally, no wheezes, rhonci, crackles Cardiovascular: Regular rate and rhythm. No murmurs, gallops or rubs. Abdomen:Soft. Bowel sounds present. Non-tender.  Extremities: No lower extremity edema. Pulses are 2 + in the bilateral DP/PT.  Echo 02/07/18 Moderate LVH,  EF 60-65, normal wall motion, grade 1 diastolic dysfunction, trivial AI, mild MR, mild LAE, mild TR, PASP 25  48 Hr Holter 02/07/18 Type 2 second degree AV block Junctional bradycardia.  Frequent Premature atrial contractions (3898 during monitoring period) Rare premature ventricular contractions   Nuclear stress test 01/10/18  Nuclear stress EF: 64% with septal wall hypokinesis  There was no ST segment deviation noted during stress.  The study is normal.  This is a low risk study. No ischemia identified.  EKG:  EKG is not ordered today. The ekg ordered today demonstrates   Recent Labs: 03/07/2018: ALT 13; BUN 21; Creatinine, Ser 1.05; Potassium 3.8; Sodium 141   Lipid Panel    Component Value Date/Time   CHOL 118 03/07/2018 0916   CHOL 122 09/22/2016 0912   TRIG 108.0 03/07/2018 0916   HDL 36.60 (L) 03/07/2018 0916   HDL 39 (L) 09/22/2016 0912   CHOLHDL 3 03/07/2018 0916   VLDL 21.6 03/07/2018 0916   LDLCALC 60 03/07/2018 0916   LDLCALC 63 09/22/2016 0912     Wt Readings from Last 3 Encounters:  06/13/18 186 lb 6.4 oz (84.6 kg)  03/16/18 184 lb (83.5 kg)  03/14/18 184 lb (83.5 kg)     Other studies Reviewed: Additional studies/ records that were reviewed today include: . Review of the above records demonstrates:    Assessment and Plan:   1. CAD without angina: He is s/p 4V CABG in 2016. Nuclear stress test July 2019 with no ischemia. He has no chest pain. Will continue Plavix, Imdur and Crestor.      2. HTN: BP is slightly elevated today. He will follow at home over next week and will call with readings. No changes  3. Hyperlipidemia: Lipids are well controlled. Continue statin.    4. Bradycardia/Second Degree AV block: He has rare dizziness since stopping his beta blocker. If he were to have near syncope or syncope, would need another cardiac monitor and EP referral.   5. Snoring/Daytime Somnolence: He does not wish to arrange a sleep  study. We discussed  this today.   Current medicines are reviewed at length with the patient today.  The patient does not have concerns regarding medicines.  The following changes have been made:  no change  Labs/ tests ordered today include:   No orders of the defined types were placed in this encounter.   Disposition:   FU with me in 12  months  Signed, Lauree Chandler, MD 06/13/2018 10:23 AM    [facility] [place], [place], [place]  [address] Phone: [phone]; Fax: [phone]

## 2018-08-17 DIAGNOSIS — Z7902 Long term (current) use of antithrombotics/antiplatelets: Secondary | ICD-10-CM | POA: Diagnosis not present

## 2018-08-17 DIAGNOSIS — R269 Unspecified abnormalities of gait and mobility: Secondary | ICD-10-CM | POA: Diagnosis not present

## 2018-08-17 DIAGNOSIS — K08409 Partial loss of teeth, unspecified cause, unspecified class: Secondary | ICD-10-CM | POA: Diagnosis not present

## 2018-08-17 DIAGNOSIS — K219 Gastro-esophageal reflux disease without esophagitis: Secondary | ICD-10-CM | POA: Diagnosis not present

## 2018-08-17 DIAGNOSIS — M1990 Unspecified osteoarthritis, unspecified site: Secondary | ICD-10-CM | POA: Diagnosis not present

## 2018-08-17 DIAGNOSIS — G629 Polyneuropathy, unspecified: Secondary | ICD-10-CM | POA: Diagnosis not present

## 2018-08-17 DIAGNOSIS — E785 Hyperlipidemia, unspecified: Secondary | ICD-10-CM | POA: Diagnosis not present

## 2018-08-17 DIAGNOSIS — G8929 Other chronic pain: Secondary | ICD-10-CM | POA: Diagnosis not present

## 2018-08-17 DIAGNOSIS — I10 Essential (primary) hypertension: Secondary | ICD-10-CM | POA: Diagnosis not present

## 2018-08-17 DIAGNOSIS — I25119 Atherosclerotic heart disease of native coronary artery with unspecified angina pectoris: Secondary | ICD-10-CM | POA: Diagnosis not present

## 2018-08-24 ENCOUNTER — Other Ambulatory Visit: Payer: Self-pay | Admitting: *Deleted

## 2018-08-24 MED ORDER — ROSUVASTATIN CALCIUM 5 MG PO TABS: 5.0000 mg | ORAL_TABLET | Freq: Every day | ORAL | 1 refills | Status: DC

## 2018-09-11 ENCOUNTER — Telehealth: Payer: Self-pay | Admitting: Family Medicine

## 2018-09-11 ENCOUNTER — Other Ambulatory Visit (INDEPENDENT_AMBULATORY_CARE_PROVIDER_SITE_OTHER): Payer: Medicare HMO

## 2018-09-11 ENCOUNTER — Telehealth: Payer: Self-pay | Admitting: Cardiovascular Disease

## 2018-09-11 DIAGNOSIS — E1142 Type 2 diabetes mellitus with diabetic polyneuropathy: Secondary | ICD-10-CM

## 2018-09-11 DIAGNOSIS — E785 Hyperlipidemia, unspecified: Secondary | ICD-10-CM

## 2018-09-11 LAB — MICROALBUMIN / CREATININE URINE RATIO
Creatinine,U: 368.1 mg/dL
Microalb Creat Ratio: 5 mg/g (ref 0.0–30.0)
Microalb, Ur: 18.5 mg/dL — ABNORMAL HIGH (ref 0.0–1.9)

## 2018-09-11 LAB — COMPREHENSIVE METABOLIC PANEL
ALT: 13 U/L (ref 0–53)
AST: 15 U/L (ref 0–37)
Albumin: 4.3 g/dL (ref 3.5–5.2)
Alkaline Phosphatase: 54 U/L (ref 39–117)
BUN: 19 mg/dL (ref 6–23)
CALCIUM: 9.2 mg/dL (ref 8.4–10.5)
CO2: 28 mEq/L (ref 19–32)
Chloride: 106 mEq/L (ref 96–112)
Creatinine, Ser: 0.93 mg/dL (ref 0.40–1.50)
GFR: 77.84 mL/min (ref >60.00)
Glucose, Bld: 167 mg/dL — ABNORMAL HIGH (ref 70–99)
Potassium: 3.6 mEq/L (ref 3.5–5.1)
Sodium: 141 mEq/L (ref 135–145)
Total Bilirubin: 0.6 mg/dL (ref 0.2–1.2)
Total Protein: 6.7 g/dL (ref 6.0–8.3)

## 2018-09-11 LAB — LIPID PANEL
Cholesterol: 119 mg/dL (ref 0–200)
HDL: 41.7 mg/dL (ref >39.00)
LDL Cholesterol: 50 mg/dL (ref 0–99)
NonHDL: 76.96
Total CHOL/HDL Ratio: 3
Triglycerides: 135 mg/dL (ref 0.0–149.0)
VLDL: 27 mg/dL (ref 0.0–40.0)

## 2018-09-11 LAB — HEMOGLOBIN A1C: Hgb A1c MFr Bld: 6.7 % — ABNORMAL HIGH (ref 4.6–6.5)

## 2018-09-11 NOTE — Telephone Encounter (Addendum)
Patient calling to request appt. Appt made for 3/5 @4pm . Patient wants to discuss BP  Pt c/o BP issue: STAT if pt c/o blurred vision, one-sided weakness or slurred speech  1. What are your last 5 BP readings? 199/102, 180/89  2. Are you having any other symptoms (ex. Dizziness, headache, blurred vision, passed out)? dizziness  3. What is your BP issue? High BP      1. Are you having CP right now? No, patient states it is not pain, but a "weird feeling" that comes and goes  2. Are you experiencing any other symptoms (ex. SOB, nausea, vomiting, sweating)? No  3. How long have you been experiencing CP? 2-3 months getting worse  4. Is your CP continuous or coming and going? Coming and going  5. Have you taken Nitroglycerin? No ?

## 2018-09-11 NOTE — Telephone Encounter (Signed)
I spoke with pt. He reports chest pain is more of an "abnormal feeling" in his chest. Not really pain.  Occurring off and on over last couple of months. BP has been elevated over the last few weeks.  Higher before he takes medicines. I asked pt to check BP about 2 hours after taking medicine tomorrow and bring this reading to office visit on Thursday.

## 2018-09-11 NOTE — Telephone Encounter (Signed)
-----   Message from Ellamae Sia sent at 09/03/2018  2:32 PM EST ----- Regarding: Lab orders for Tuesday, 3.3.20 Lab orders for a f/u

## 2018-09-11 NOTE — Telephone Encounter (Signed)
Left message to call office

## 2018-09-13 ENCOUNTER — Encounter: Payer: Self-pay | Admitting: Cardiovascular Disease

## 2018-09-13 ENCOUNTER — Ambulatory Visit: Payer: Medicare HMO | Admitting: Cardiovascular Disease

## 2018-09-13 VITALS — BP 150/78 | HR 86 | Ht 65.0 in | Wt 188.0 lb

## 2018-09-13 DIAGNOSIS — R001 Bradycardia, unspecified: Secondary | ICD-10-CM | POA: Diagnosis not present

## 2018-09-13 DIAGNOSIS — I10 Essential (primary) hypertension: Secondary | ICD-10-CM

## 2018-09-13 DIAGNOSIS — I2510 Atherosclerotic heart disease of native coronary artery without angina pectoris: Secondary | ICD-10-CM | POA: Diagnosis not present

## 2018-09-13 DIAGNOSIS — E7800 Pure hypercholesterolemia, unspecified: Secondary | ICD-10-CM

## 2018-09-13 MED ORDER — LISINOPRIL 40 MG PO TABS: 40.0000 mg | ORAL_TABLET | Freq: Every day | ORAL | 3 refills | Status: DC

## 2018-09-13 NOTE — Progress Notes (Signed)
Chief Complaint  Patient presents with  . Follow-up    CAD   History of Present Illness: 84 yo male with history of CAD s/p CABG 2016, prior coronary stent placement before his CABG, HTN, hyperlipidemia and GERD who presents today for cardiac follow up. He has been followed for CAD since 2005 when he had a LAD stent placed. He has not tolerated statins in the past secondary to myalgias. He was evaluated April 2016 for chest pain. Cardiac catheterization 10/31/14 with multivessel disease with severe 95% distal left main stenosis. He underwent 4V CABG (LIMA-LAD, SVG-DX, SVG-PDA, SVG-OM) April 2016. Echo April 2017 with normal LV function and no significant valvular disease. He was seen in July 2019 for chest pain, palpitations and uncontrolled blood pressure. Nuclear stress test July 2019 with no ischemia. Echo July 2019 with LVEF=60-65% with grade 1 diastolic dysfunction. Mild MR. Cardiac monitor July 2019 with type 2 second degree AV block and junctional bradycardia with frequent PACs. HIs beta blocker was stopped. He felt better after stopping the beta blocker.   He is here today for follow up. The patient denies any chest pain, dyspnea, palpitations, lower extremity edema, orthopnea, PND, dizziness, near syncope or syncope. His BP has been high at home running in the 190s.    Primary Care Physician: Jinny Sanders, MD  Past Medical History:  Diagnosis Date  . CAD (coronary artery disease)    s/p stent mid LAD 2005 // s/p CABG 2016 // Myoview 8/19:  EF 64, no ischemia  . Coronary artery disease involving native coronary artery of native heart without angina pectoris   . Depression   . DJD (degenerative joint disease)   . DM type 2 with diabetic peripheral neuropathy ([place]) 03/10/2017  . E coli bacteremia 04/20/2016  . Essential hypertension 04/20/2016  . GERD (gastroesophageal reflux disease)   . History of echocardiogram    Echo 8/19: Moderate LVH, EF 60-65, normal wall motion, grade 1  diastolic dysfunction, mild MR, mild LAE, normal RVSF, mild TR, PASP 25  . History of hiatal hernia   . HTN (hypertension)   . Hyperlipidemia   . Neuropathy due to type 2 diabetes mellitus ([place]) 03/10/2017  . PERCUTANEOUS TRANSLUMINAL CORONARY ANGIOPLASTY, HX OF 11/26/2007   Annotation: With CYPHER STENT. Qualifier: Diagnosis of  By: [place], [place]    . Pre-diabetes   . S/P CABG x 4 11/03/2014  . S/P total knee replacement 04/11/2016  . Sepsis due to Escherichia coli (E. coli) ([place]) 04/20/2016  . Tinea pedis 09/08/2017    Past Surgical History:  Procedure Laterality Date  . BRAIN SURGERY    . CARDIAC CATHETERIZATION    . CORONARY ARTERY BYPASS GRAFT N/A 11/03/2014   Procedure: CORONARY ARTERY BYPASS GRAFTING (CABG), ON PUMP, TIMES FOUR, USING LEFT INTERNAL MAMMARY, RIGHT GREATER SAPHENOUS VEIN HARVESTED ENDOSCOPICALLY;  Surgeon: Ivin Poot, MD;  Location: [facility] [facility];  Service: Open Heart Surgery;  Laterality: N/A;  LIMA-LAD; SVG-DIAG; SVG-OM; SVG-RCA  . CRANIOTOMY N/A 01/18/2014   Procedure: CRANIOTOMY HEMATOMA EVACUATION SUBDURAL;  Surgeon: Hosie Spangle, MD;  Location: [facility];  Service: Neurosurgery;  Laterality: N/A;  . kidney stone removal    . KNEE SURGERY    . LEFT HEART CATHETERIZATION WITH CORONARY ANGIOGRAM N/A 07/27/2011   Procedure: LEFT HEART CATHETERIZATION WITH CORONARY ANGIOGRAM;  Surgeon: Burnell Blanks, MD;  Location: [facility] CATH LAB;  Service: Cardiovascular;  Laterality: N/A;  . LEFT HEART CATHETERIZATION WITH CORONARY ANGIOGRAM N/A 10/31/2014  Procedure: LEFT HEART CATHETERIZATION WITH CORONARY ANGIOGRAM;  Surgeon: Sherren Mocha, MD;  Location: [facility] CATH LAB;  Service: Cardiovascular;  Laterality: N/A;  . PTCA     Hx of it.   Marland Kitchen SIGMOIDOSCOPY    . TEE WITHOUT CARDIOVERSION N/A 11/03/2014   Procedure: TRANSESOPHAGEAL ECHOCARDIOGRAM (TEE);  Surgeon: Ivin Poot, MD;  Location: [facility];  Service: Open Heart Surgery;  Laterality: N/A;  . TOTAL KNEE ARTHROPLASTY   02/2011   Right knee  . TOTAL KNEE ARTHROPLASTY Left 04/11/2016   Procedure: LEFT TOTAL KNEE ARTHROPLASTY;  Surgeon: Vickey Huger, MD;  Location: [facility];  Service: Orthopedics;  Laterality: Left;    Current Outpatient Medications  Medication Sig Dispense Refill  . acetaminophen (TYLENOL) 500 MG tablet Take 500-1,000 mg by mouth 2 (two) times daily as needed (for pain).     Marland Kitchen clopidogrel (PLAVIX) 75 MG tablet TAKE 1 TABLET BY MOUTH ONCE DAILY 90 tablet 3  . isosorbide mononitrate (IMDUR) 30 MG 24 hr tablet TAKE 1 TABLET BY MOUTH ONCE DAILY 90 tablet 3  . nitroGLYCERIN (NITROSTAT) 0.4 MG SL tablet Place 0.4 mg under the tongue every 5 (five) minutes as needed for chest pain.    . pantoprazole (PROTONIX) 40 MG tablet TAKE 1 TABLET BY MOUTH ONCE DAILY 90 tablet 1  . rosuvastatin (CRESTOR) 5 MG tablet Take 1 tablet (5 mg total) by mouth daily. 90 tablet 1  . lisinopril (PRINIVIL,ZESTRIL) 40 MG tablet Take 1 tablet (40 mg total) by mouth daily. 90 tablet 3   No current facility-administered medications for this visit.     Allergies  Allergen Reactions  . Protamine Rash    Hypotension, Rash, unstable vitals.    . Statins Other (See Comments)    Caused soreness (Lipitor, Zocor)  . Rosuvastatin Calcium Other (See Comments)    Caused soreness (Lipitor, Zocor)  . Imodium [Loperamide] Itching and Rash    Social History   Socioeconomic History  . Marital status: Married    Spouse name: Not on file  . Number of children: Not on file  . Years of education: Not on file  . Highest education level: Not on file  Occupational History  . Not on file  Social Needs  . Financial resource strain: Not on file  . Food insecurity:    Worry: Not on file    Inability: Not on file  . Transportation needs:    Medical: Not on file    Non-medical: Not on file  Tobacco Use  . Smoking status: Former Research scientist (life sciences)  . Smokeless tobacco: Former Systems developer  . Tobacco comment: quit in 1985. smoked 1ppd for 35 years     Substance and Sexual Activity  . Alcohol use: No  . Drug use: No  . Sexual activity: Never  Lifestyle  . Physical activity:    Days per week: Not on file    Minutes per session: Not on file  . Stress: Not on file  Relationships  . Social connections:    Talks on phone: Not on file    Gets together: Not on file    Attends religious service: Not on file    Active member of club or organization: Not on file    Attends meetings of clubs or organizations: Not on file    Relationship status: Not on file  . Intimate partner violence:    Fear of current or ex partner: Not on file    Emotionally abused: Not on file    Physically  abused: Not on file    Forced sexual activity: Not on file  Other Topics Concern  . Not on file  Social History Narrative   Married with children. 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 Heart disease Father   . Heart attack Father   . Heart failure Mother   . COPD Mother   . Heart disease Sister   . Heart attack Sister   . Heart attack Brother     Review of Systems:  As stated in the HPI and otherwise negative.   BP (!) 150/78   Pulse 86   Ht 5\' 5"  (1.651 m)   Wt 188 lb (85.3 kg)   SpO2 97%   BMI 31.28 kg/m   Physical Examination:  General: Well developed, well nourished, NAD  HEENT: OP clear, mucus membranes moist  SKIN: warm, dry. No rashes. Neuro: No focal deficits  Musculoskeletal: Muscle strength 5/5 all ext  Psychiatric: Mood and affect normal  Neck: No JVD, no carotid bruits, no thyromegaly, no lymphadenopathy.  Lungs:Clear bilaterally, no wheezes, rhonci, crackles Cardiovascular: Regular rate and rhythm. No murmurs, gallops or rubs. Abdomen:Soft. Bowel sounds present. Non-tender.  Extremities: No lower extremity edema. Pulses are 2 + in the bilateral  DP/PT.  Echo 02/07/18 Moderate LVH, EF 60-65, normal wall motion, grade 1 diastolic dysfunction, trivial AI, mild MR, mild LAE, mild TR, PASP 25  48 Hr Holter 02/07/18 Type 2 second degree AV block Junctional bradycardia.  Frequent Premature atrial contractions (3898 during monitoring period) Rare premature ventricular contractions   Nuclear stress test 01/10/18  Nuclear stress EF: 64% with septal wall hypokinesis  There was no ST segment deviation noted during stress.  The study is normal.  This is a low risk study. No ischemia identified.  EKG:  EKG is ordered today. The ekg ordered today demonstrates NSR, rate 84 bpm. RBBB. LAFB  Recent Labs: 09/11/2018: ALT 13; BUN 19; Creatinine, Ser 0.93; Potassium 3.6; Sodium 141   Lipid Panel    Component Value Date/Time   CHOL 119 09/11/2018 1104   CHOL 122 09/22/2016 0912   TRIG 135.0 09/11/2018 1104   HDL 41.70 09/11/2018 1104   HDL 39 (L) 09/22/2016 0912   CHOLHDL 3 09/11/2018 1104   VLDL 27.0 09/11/2018 1104   LDLCALC 50 09/11/2018 1104   LDLCALC 63 09/22/2016 0912     Wt Readings from Last 3 Encounters:  09/13/18 188 lb (85.3 kg)  06/13/18 186 lb 6.4 oz (84.6 kg)  03/16/18 184 lb (83.5 kg)     Other studies Reviewed: Additional studies/ records that were reviewed today include: . Review of the above records demonstrates:    Assessment and Plan:   1. CAD without angina: He is s/p 4V CABG in 2016. Nuclear stress test July 2019 with no ischemia. No chest pain. Continue Plavix, Imdur and Crestor.     2. HTN: BP is elevated at home and today. Will increase LIsinopril to 40 mg daily. BMET one week. If we are unable to get his BP to goal, will add HCTZ or Norvasc.   3. Hyperlipidemia: LDL at goal. Continue statin.     4. Bradycardia/Second Degree AV block: No dizziness off of the beta blocker. If he were to have near syncope or syncope, would need another cardiac monitor and  EP referral.   5. Snoring/Daytime  Somnolence: He does not wish to arrange a sleep study.  Current medicines are reviewed at length with the patient today.  The patient does not have concerns regarding medicines.  The following changes have been made:  no change  Labs/ tests ordered today include:   Orders Placed This Encounter  Procedures  . Basic Metabolic Panel (BMET)  . EKG 12-Lead    Disposition:   FU with me in 12  months  Signed, Lauree Chandler, MD 09/13/2018 4:36 PM    [facility] [place], [place], [place]  [address] Phone: [phone]; Fax: [phone]

## 2018-09-13 NOTE — Patient Instructions (Signed)
Medication Instructions:  Your physician has recommended you make the following change in your medication: Increase lisinopril to 40 mg by mouth daily.   If you need a refill on your cardiac medications before your next appointment, please call your pharmacy.   Lab work: Your physician recommends that you return for lab work on March 13,2020.  The lab opens at 7:30 AM.  (BMP)  If you have labs (blood work) drawn today and your tests are completely normal, you will receive your results only by: Marland Kitchen MyChart Message (if you have MyChart) OR . A paper copy in the mail If you have any lab test that is abnormal or we need to change your treatment, we will call you to review the results.  Testing/Procedures: none  Follow-Up: At [facility], you and your health needs are our priority.  As part of our continuing mission to provide you with exceptional heart care, we have created designated Provider Care Teams.  These Care Teams include your primary Cardiologist (physician) and Advanced Practice Providers (APPs -  Physician Assistants and Nurse Practitioners) who all work together to provide you with the care you need, when you need it. You will need a follow up appointment in 12 months.  Please call our office 2 months in advance to schedule this appointment.  You may see Lauree Chandler, MD or one of the following Advanced Practice Providers on your designated Care Team:   [place], PA-C Melina Copa, PA-C . Ermalinda Barrios, PA-C  Any Other Special Instructions Will Be Listed Below (If Applicable). Check blood pressure at home and keep record of readings.  Call us if it remains elevated.

## 2018-09-18 ENCOUNTER — Ambulatory Visit (INDEPENDENT_AMBULATORY_CARE_PROVIDER_SITE_OTHER): Payer: Medicare HMO | Admitting: Family Medicine

## 2018-09-18 ENCOUNTER — Encounter: Payer: Self-pay | Admitting: Family Medicine

## 2018-09-18 VITALS — BP 140/80 | HR 81 | Resp 16 | Ht 67.5 in | Wt 185.2 lb

## 2018-09-18 DIAGNOSIS — E1140 Type 2 diabetes mellitus with diabetic neuropathy, unspecified: Secondary | ICD-10-CM | POA: Diagnosis not present

## 2018-09-18 DIAGNOSIS — E1142 Type 2 diabetes mellitus with diabetic polyneuropathy: Secondary | ICD-10-CM | POA: Diagnosis not present

## 2018-09-18 DIAGNOSIS — I10 Essential (primary) hypertension: Secondary | ICD-10-CM

## 2018-09-18 DIAGNOSIS — H6983 Other specified disorders of Eustachian tube, bilateral: Secondary | ICD-10-CM | POA: Diagnosis not present

## 2018-09-18 DIAGNOSIS — E785 Hyperlipidemia, unspecified: Secondary | ICD-10-CM

## 2018-09-18 LAB — HM DIABETES FOOT EXAM

## 2018-09-18 NOTE — Assessment & Plan Note (Signed)
Improved control on higher dose of lisinopril. No change in SE, not any worse on higher dose.. dizziness not likely due to BP med.

## 2018-09-18 NOTE — Assessment & Plan Note (Signed)
Well controlled. Continue current medication.  

## 2018-09-18 NOTE — Progress Notes (Signed)
Subjective:    Patient ID: [name], male    DOB: 10/02/1934, 84 y.o.   MRN: [id number]  HPI  84 year old male pt presents for follow up 6 month of DM, HTN, chol.  Diabetes:   Good control on no med. Lab Results  Component Value Date   HGBA1C 6.7 (H) 09/11/2018  Using medications without difficulties: Hypoglycemic episodes:none Hyperglycemic episodes: none Feet problems: no ulcers Blood Sugars averaging: FBS 109-140 eye exam within last year: due  Hypertension:   Good control for age on lisinopril now that lisinopril 40 mg. (Reviewed Cardiology note from 09/13/2018. Recent headaches are now better. BP Readings from Last 3 Encounters:  09/18/18 140/80  09/13/18 (!) 150/78  06/13/18 (!) 144/84  Using medication without problems or lightheadedness:  Off and on.. this has been going on fo a while. Chest pain with exertion:none Edema:none Short of breath:none Average home BPs: Other issues:  Elevated Cholesterol:  Hx of CAD, LDL at goal < 70 on  crestor 5 Lab Results  Component Value Date   CHOL 119 09/11/2018   HDL 41.70 09/11/2018   LDLCALC 50 09/11/2018   TRIG 135.0 09/11/2018   CHOLHDL 3 09/11/2018  Using medications without problems: Muscle aches:  Diet compliance: moderate Exercise: walking Other complaints:    Social History /Family History/Past Medical History reviewed in detail and updated in EMR if needed. Blood pressure 140/80, pulse 81, resp. rate 16, height 5' 7.5" (1.715 m), weight 185 lb 4 oz (84 kg), SpO2 97 %.  Review of Systems  Constitutional: Negative for fatigue and fever.  HENT: Negative for ear pain.   Eyes: Negative for pain.  Respiratory: Negative for cough and shortness of breath.   Cardiovascular: Negative for chest pain, palpitations and leg swelling.  Gastrointestinal: Negative for abdominal pain.  Genitourinary: Negative for dysuria.  Musculoskeletal: Negative for arthralgias.  Neurological: Positive for dizziness. Negative for  syncope, light-headedness and headaches.  Psychiatric/Behavioral: Negative for dysphoric mood.       Objective:   Physical Exam Constitutional:      Appearance: He is well-developed.     Comments: Elderly male in NAD  HENT:     Head: Normocephalic.     Right Ear: Hearing normal.     Left Ear: Hearing normal.     Ears:     Comments: Tube in right TM  wax bilaterally    Nose: Nose normal.  Neck:     Thyroid: No thyroid mass or thyromegaly.     Vascular: No carotid bruit.     Trachea: Trachea normal.  Cardiovascular:     Rate and Rhythm: Normal rate and regular rhythm.     Pulses: Normal pulses.     Heart sounds: Heart sounds not distant. No murmur. No friction rub. No gallop.      Comments: No peripheral edema Pulmonary:     Effort: Pulmonary effort is normal. No respiratory distress.     Breath sounds: Normal breath sounds.  Skin:    General: Skin is warm and dry.     Findings: No rash.  Psychiatric:        Speech: Speech normal.        Behavior: Behavior normal.        Thought Content: Thought content normal.     Diabetic foot exam: Normal inspection No skin breakdown No calluses  Normal DP pulses Normal sensation to light touch and monofilament Nails normal       Assessment &  Plan:

## 2018-09-18 NOTE — Assessment & Plan Note (Signed)
Has Etube in right ear.. not draining at times, pops.  Start flonase and cerumnex. ? If dizziness related to inner ear issues and not BP med SE as pt thinks.

## 2018-09-18 NOTE — Assessment & Plan Note (Signed)
Good control with diet. 

## 2018-09-18 NOTE — Patient Instructions (Addendum)
Due for yearly eye exam.  Follow blood pressure at home, get a new BP cuff.  Can try flonase 2 sprays per nostril daily  For fluid in ears and post nasal drip.  May help with dizziness.

## 2018-09-21 ENCOUNTER — Other Ambulatory Visit: Payer: Medicare HMO

## 2018-09-24 ENCOUNTER — Other Ambulatory Visit: Payer: Medicare HMO | Admitting: *Deleted

## 2018-09-24 ENCOUNTER — Other Ambulatory Visit: Payer: Self-pay

## 2018-09-24 DIAGNOSIS — I10 Essential (primary) hypertension: Secondary | ICD-10-CM | POA: Diagnosis not present

## 2018-09-24 LAB — BASIC METABOLIC PANEL
BUN/Creatinine Ratio: 17 (ref 10–24)
BUN: 15 mg/dL (ref 8–27)
CO2: 21 mmol/L (ref 20–29)
Calcium: 8.9 mg/dL (ref 8.6–10.2)
Chloride: 106 mmol/L (ref 96–106)
Creatinine, Ser: 0.9 mg/dL (ref 0.76–1.27)
GFR calc Af Amer: 92 mL/min/{1.73_m2} (ref >59)
GFR calc non Af Amer: 80 mL/min/{1.73_m2} (ref >59)
Glucose: 123 mg/dL — ABNORMAL HIGH (ref 65–99)
Potassium: 3.9 mmol/L (ref 3.5–5.2)
Sodium: 144 mmol/L (ref 134–144)

## 2018-11-27 ENCOUNTER — Other Ambulatory Visit: Payer: Self-pay | Admitting: Family Medicine

## 2019-02-19 ENCOUNTER — Other Ambulatory Visit: Payer: Self-pay | Admitting: Family Medicine

## 2019-03-12 ENCOUNTER — Telehealth: Payer: Self-pay | Admitting: Family Medicine

## 2019-03-12 DIAGNOSIS — E1142 Type 2 diabetes mellitus with diabetic polyneuropathy: Secondary | ICD-10-CM

## 2019-03-12 DIAGNOSIS — H524 Presbyopia: Secondary | ICD-10-CM | POA: Diagnosis not present

## 2019-03-12 DIAGNOSIS — H52203 Unspecified astigmatism, bilateral: Secondary | ICD-10-CM | POA: Diagnosis not present

## 2019-03-12 DIAGNOSIS — H04123 Dry eye syndrome of bilateral lacrimal glands: Secondary | ICD-10-CM | POA: Diagnosis not present

## 2019-03-12 NOTE — Telephone Encounter (Signed)
-----   Message from Cloyd Stagers, RT sent at 03/05/2019  5:18 PM EDT ----- Regarding: Lab Orders for Wednesday 9.2.2020 Please place lab orders for Wednesday 9.2.2020, office visit for physical on Friday 9.11.2020 Thank you, Dyke Maes RT(R)

## 2019-03-13 ENCOUNTER — Other Ambulatory Visit (INDEPENDENT_AMBULATORY_CARE_PROVIDER_SITE_OTHER): Payer: Medicare HMO

## 2019-03-13 ENCOUNTER — Ambulatory Visit: Payer: Medicare HMO

## 2019-03-13 DIAGNOSIS — E1142 Type 2 diabetes mellitus with diabetic polyneuropathy: Secondary | ICD-10-CM | POA: Diagnosis not present

## 2019-03-13 LAB — LIPID PANEL
Cholesterol: 113 mg/dL (ref 0–200)
HDL: 38.9 mg/dL — ABNORMAL LOW (ref >39.00)
LDL Cholesterol: 60 mg/dL (ref 0–99)
NonHDL: 74
Total CHOL/HDL Ratio: 3
Triglycerides: 70 mg/dL (ref 0.0–149.0)
VLDL: 14 mg/dL (ref 0.0–40.0)

## 2019-03-13 LAB — COMPREHENSIVE METABOLIC PANEL
ALT: 12 U/L (ref 0–53)
AST: 17 U/L (ref 0–37)
Albumin: 4.1 g/dL (ref 3.5–5.2)
Alkaline Phosphatase: 45 U/L (ref 39–117)
BUN: 19 mg/dL (ref 6–23)
CO2: 27 mEq/L (ref 19–32)
Calcium: 8.9 mg/dL (ref 8.4–10.5)
Chloride: 108 mEq/L (ref 96–112)
Creatinine, Ser: 0.84 mg/dL (ref 0.40–1.50)
GFR: 87.44 mL/min (ref >60.00)
Glucose, Bld: 118 mg/dL — ABNORMAL HIGH (ref 70–99)
Potassium: 3.6 mEq/L (ref 3.5–5.1)
Sodium: 142 mEq/L (ref 135–145)
Total Bilirubin: 0.6 mg/dL (ref 0.2–1.2)
Total Protein: 6.6 g/dL (ref 6.0–8.3)

## 2019-03-13 LAB — HEMOGLOBIN A1C: Hgb A1c MFr Bld: 6.7 % — ABNORMAL HIGH (ref 4.6–6.5)

## 2019-03-13 NOTE — Progress Notes (Signed)
No critical labs need to be addressed urgently. We will discuss labs in detail at upcoming office visit.   

## 2019-03-22 ENCOUNTER — Encounter: Payer: Self-pay | Admitting: Family Medicine

## 2019-03-22 ENCOUNTER — Ambulatory Visit (INDEPENDENT_AMBULATORY_CARE_PROVIDER_SITE_OTHER): Payer: Medicare HMO | Admitting: Family Medicine

## 2019-03-22 ENCOUNTER — Other Ambulatory Visit: Payer: Self-pay

## 2019-03-22 VITALS — BP 140/74 | HR 87 | Temp 98.7°F | Ht 66.0 in | Wt 178.2 lb

## 2019-03-22 DIAGNOSIS — E1142 Type 2 diabetes mellitus with diabetic polyneuropathy: Secondary | ICD-10-CM | POA: Diagnosis not present

## 2019-03-22 DIAGNOSIS — I2510 Atherosclerotic heart disease of native coronary artery without angina pectoris: Secondary | ICD-10-CM

## 2019-03-22 DIAGNOSIS — E785 Hyperlipidemia, unspecified: Secondary | ICD-10-CM | POA: Diagnosis not present

## 2019-03-22 DIAGNOSIS — E1140 Type 2 diabetes mellitus with diabetic neuropathy, unspecified: Secondary | ICD-10-CM | POA: Diagnosis not present

## 2019-03-22 DIAGNOSIS — Z0000 Encounter for general adult medical examination without abnormal findings: Secondary | ICD-10-CM

## 2019-03-22 DIAGNOSIS — Z23 Encounter for immunization: Secondary | ICD-10-CM | POA: Diagnosis not present

## 2019-03-22 DIAGNOSIS — I10 Essential (primary) hypertension: Secondary | ICD-10-CM

## 2019-03-22 LAB — HM DIABETES FOOT EXAM

## 2019-03-22 NOTE — Assessment & Plan Note (Signed)
Dr. Angelena Form Cardiology following. On imdur, plavix and nitroprn.

## 2019-03-22 NOTE — Progress Notes (Signed)
Chief Complaint  Patient presents with  . Medicare Wellness    History of Present Illness: HPI  The patient presents for annual medicare wellness, complete physical and review of chronic health problems. He/She also has the following acute concerns today:  I have personally reviewed the Medicare Annual Wellness questionnaire and have noted 1. The patient's medical and social history 2. Their use of alcohol, tobacco or illicit drugs 3. Their current medications and supplements 4. The patient's functional ability including ADL's, fall risks, home safety risks and hearing or visual             impairment. 5. Diet and physical activities 6. Evidence for depression or mood disorders 7.         Updated provider list Cognitive evaluation was performed and recorded on pt medicare questionnaire form. The patients weight, height, BMI and visual acuity have been recorded in the chart  I have made referrals, counseling and provided education to the patient based review of the above and I have provided the pt with a written personalized care plan for preventive services.   Documentation of this information was scanned into the electronic record under the media tab.   Advance directives and end of life planning reviewed in detail with patient and documented in EMR. Patient given handout on advance care directives if needed. HCPOA and living will updated if needed.   Hearing Screening   125Hz  250Hz  500Hz  1000Hz  2000Hz  3000Hz  4000Hz  6000Hz  8000Hz   Right ear:           Left ear:           Comments: Wears bilateral hearing aides  Vision Screening Comments: Eye Exam with Dr. Gershon Crane 03/12/2019.  Report in Epic.  Fall Risk  03/22/2019 03/07/2018 03/03/2017 01/21/2016 12/06/2013  Falls in the past year? 0 No No No Yes  Number falls in past yr: - - - - 2 or more  Risk Factor Category  - - - - High Fall Risk  Risk for fall due to : - - - - Impaired balance/gait     Office Visit from 03/22/2019 in [place] [facility] Total Score  0      Hypertension:   Tolerable control for age on current meds. BP Readings from Last 3 Encounters:  03/22/19 140/74  09/18/18 140/80  09/13/18 (!) 150/78  Using medication without problems or lightheadedness:  none Chest pain with exertion: occ when walking in heat.. not new Edema:none Short of breath:none Average home BPs: Other issues:  Elevated Cholesterol:  Hx of CAD, LDL at goal < 70  On crestor. Lab Results  Component Value Date   CHOL 113 03/13/2019   HDL 38.90 (L) 03/13/2019   LDLCALC 60 03/13/2019   TRIG 70.0 03/13/2019   CHOLHDL 3 03/13/2019  Using medications without problems: Muscle aches:  Diet compliance: healthy Exercise: walking daily Other complaints:   Diabetes:   Good control with diet. Lab Results  Component Value Date   HGBA1C 6.7 (H) 03/13/2019  Using medications without difficulties: Hypoglycemic episodes:none Hyperglycemic episodes:none Feet problems: no ulcers Blood Sugars averaging: FBS 100-120 eye exam within last year: 03/12/2019  Urine flow has decreased with age. Only wakes once a night with nocturia  COVID 19 screen No recent travel or known exposure to [place] The patient denies respiratory symptoms of COVID 19 at this time.  The importance of social distancing was discussed today.   Review of Systems  Constitutional: Negative for chills and  fever.  HENT: Negative for congestion and ear pain.   Eyes: Negative for pain and redness.  Respiratory: Negative for cough and shortness of breath.   Cardiovascular: Negative for chest pain, palpitations and leg swelling.  Gastrointestinal: Negative for abdominal pain, blood in stool, constipation, diarrhea, nausea and vomiting.  Genitourinary: Negative for dysuria.  Musculoskeletal: Negative for falls and myalgias.  Skin: Negative for rash.  Neurological: Negative for dizziness.  Psychiatric/Behavioral: Negative for depression. The patient  is not nervous/anxious.       Past Medical History:  Diagnosis Date  . CAD (coronary artery disease)    s/p stent mid LAD 2005 // s/p CABG 2016 // Myoview 8/19:  EF 64, no ischemia  . Coronary artery disease involving native coronary artery of native heart without angina pectoris   . Depression   . DJD (degenerative joint disease)   . DM type 2 with diabetic peripheral neuropathy ([place]) 03/10/2017  . E coli bacteremia 04/20/2016  . Essential hypertension 04/20/2016  . GERD (gastroesophageal reflux disease)   . History of echocardiogram    Echo 8/19: Moderate LVH, EF 60-65, normal wall motion, grade 1 diastolic dysfunction, mild MR, mild LAE, normal RVSF, mild TR, PASP 25  . History of hiatal hernia   . HTN (hypertension)   . Hyperlipidemia   . Neuropathy due to type 2 diabetes mellitus ([place]) 03/10/2017  . PERCUTANEOUS TRANSLUMINAL CORONARY ANGIOPLASTY, HX OF 11/26/2007   Annotation: With CYPHER STENT. Qualifier: Diagnosis of  By: [place], [place]    . Pre-diabetes   . S/P CABG x 4 11/03/2014  . S/P total knee replacement 04/11/2016  . Sepsis due to Escherichia coli (E. coli) ([place]) 04/20/2016  . Tinea pedis 09/08/2017    reports that he has quit smoking. He has quit using smokeless tobacco. He reports that he does not drink alcohol or use drugs.   Current Outpatient Medications:  .  acetaminophen (TYLENOL) 500 MG tablet, Take 500-1,000 mg by mouth 2 (two) times daily as needed (for pain). , Disp: , Rfl:  .  clopidogrel (PLAVIX) 75 MG tablet, TAKE 1 TABLET BY MOUTH ONCE DAILY, Disp: 90 tablet, Rfl: 3 .  isosorbide mononitrate (IMDUR) 30 MG 24 hr tablet, TAKE 1 TABLET BY MOUTH ONCE DAILY, Disp: 90 tablet, Rfl: 3 .  lisinopril (PRINIVIL,ZESTRIL) 40 MG tablet, Take 1 tablet (40 mg total) by mouth daily., Disp: 90 tablet, Rfl: 3 .  nitroGLYCERIN (NITROSTAT) 0.4 MG SL tablet, Place 0.4 mg under the tongue every 5 (five) minutes as needed for chest pain., Disp: , Rfl:  .  pantoprazole  (PROTONIX) 40 MG tablet, Take 1 tablet by mouth once daily, Disp: 90 tablet, Rfl: 1 .  rosuvastatin (CRESTOR) 5 MG tablet, Take 1 tablet by mouth once daily, Disp: 90 tablet, Rfl: 1   Patient Care Team: Jinny Sanders, MD as PCP - General (Family Medicine) Burnell Blanks, MD as PCP - Cardiology (Cardiology) Rutherford Guys, MD as Consulting Physician (Ophthalmology) Observations/Objective: Blood pressure 140/74, pulse 87, temperature 98.7 F (37.1 C), temperature source Temporal, height 5\' 6"  (1.676 m), weight 178 lb 4 oz (80.9 kg), SpO2 95 %.  Physical Exam Constitutional:      General: He is not in acute distress.    Appearance: Normal appearance. He is well-developed. He is not ill-appearing or toxic-appearing.  HENT:     Head: Normocephalic and atraumatic.     Right Ear: Hearing, tympanic membrane, ear canal and external ear normal.  Left Ear: Hearing, tympanic membrane, ear canal and external ear normal.     Nose: Nose normal.     Mouth/Throat:     Pharynx: Uvula midline.  Eyes:     General: Lids are normal. Lids are everted, no foreign bodies appreciated.     Conjunctiva/sclera: Conjunctivae normal.     Pupils: Pupils are equal, round, and reactive to light.  Neck:     Musculoskeletal: Normal range of motion and neck supple.     Thyroid: No thyroid mass or thyromegaly.     Vascular: No carotid bruit.     Trachea: Trachea and phonation normal.  Cardiovascular:     Rate and Rhythm: Normal rate and regular rhythm.     Pulses: Normal pulses.     Heart sounds: S1 normal and S2 normal. No murmur. No gallop.   Pulmonary:     Breath sounds: Normal breath sounds. No wheezing, rhonchi or rales.  Abdominal:     General: Bowel sounds are normal.     Palpations: Abdomen is soft.     Tenderness: There is no abdominal tenderness. There is no guarding or rebound.     Hernia: No hernia is present. There is no hernia in the left inguinal area.  Genitourinary:    Penis:  Normal. No paraphimosis or tenderness.      Scrotum/Testes: Normal.        Right: Mass or tenderness not present.        Left: Mass or tenderness not present.     Prostate: Normal. Not enlarged and not tender.     Rectum: Guaiac result negative. No mass, tenderness, anal fissure, external hemorrhoid or internal hemorrhoid. Normal anal tone.  Lymphadenopathy:     Cervical: No cervical adenopathy.  Skin:    General: Skin is warm and dry.     Findings: No rash.  Neurological:     Mental Status: He is alert.     Cranial Nerves: No cranial nerve deficit.     Sensory: No sensory deficit.     Gait: Gait normal.     Deep Tendon Reflexes: Reflexes are normal and symmetric.  Psychiatric:        Speech: Speech normal.        Behavior: Behavior normal.        Judgment: Judgment normal.      Diabetic foot exam: Normal inspection No skin breakdown No calluses  Normal DP pulses No sensation to light touch and monofilament bilaterally. Nails normal Assessment and Plan   The patient's preventative maintenance and recommended screening tests for an annual wellness exam were reviewed in full today. Brought up to date unless services declined.  Counselled on the importance of diet, exercise, and its role in overall health and mortality. The patient's FH and SH was reviewed, including their home life, tobacco status, and drug and alcohol status.   Vaccines: UPTODATE except  Tdap, given flu colonoscopy not indicated former smoker, remote  Essential hypertension Tolerable control for age on current meds.  Hyperlipidemia Hx of CAD, LDL at goal < 70  On crestor.  DM type 2 with diabetic peripheral neuropathy (HCC) Good control with diet.  Neuropathy due to type 2 diabetes mellitus (HCC) Stable control on no meds. Due to DM.  Coronary artery disease involving native coronary artery of native heart without angina pectoris Dr. Angelena Form Cardiology following. On imdur, plavix and  nitroprn.    Eliezer Lofts, MD

## 2019-03-22 NOTE — Patient Instructions (Addendum)
Call if urinary symptoms increasing, if pain in feet.  Call if interested in referral to balance retraining with physical therapy.  continue healthy eating and regular exercise.

## 2019-03-22 NOTE — Assessment & Plan Note (Signed)
Good control with diet.

## 2019-03-22 NOTE — Assessment & Plan Note (Signed)
Hx of CAD, LDL at goal < 70  On crestor.

## 2019-03-22 NOTE — Assessment & Plan Note (Signed)
Tolerable control for age on current meds.

## 2019-03-22 NOTE — Assessment & Plan Note (Signed)
Stable control on no meds. Due to DM.

## 2019-04-02 DIAGNOSIS — D485 Neoplasm of uncertain behavior of skin: Secondary | ICD-10-CM | POA: Diagnosis not present

## 2019-04-02 DIAGNOSIS — D23122 Other benign neoplasm of skin of left lower eyelid, including canthus: Secondary | ICD-10-CM | POA: Diagnosis not present

## 2019-04-02 DIAGNOSIS — L570 Actinic keratosis: Secondary | ICD-10-CM | POA: Diagnosis not present

## 2019-04-02 DIAGNOSIS — L821 Other seborrheic keratosis: Secondary | ICD-10-CM | POA: Diagnosis not present

## 2019-04-02 DIAGNOSIS — L538 Other specified erythematous conditions: Secondary | ICD-10-CM | POA: Diagnosis not present

## 2019-04-02 DIAGNOSIS — L820 Inflamed seborrheic keratosis: Secondary | ICD-10-CM | POA: Diagnosis not present

## 2019-04-02 DIAGNOSIS — X32XXXA Exposure to sunlight, initial encounter: Secondary | ICD-10-CM | POA: Diagnosis not present

## 2019-04-02 DIAGNOSIS — D044 Carcinoma in situ of skin of scalp and neck: Secondary | ICD-10-CM | POA: Diagnosis not present

## 2019-05-09 ENCOUNTER — Other Ambulatory Visit: Payer: Self-pay | Admitting: Cardiovascular Disease

## 2019-06-15 ENCOUNTER — Other Ambulatory Visit: Payer: Self-pay | Admitting: Family Medicine

## 2019-07-06 DIAGNOSIS — I10 Essential (primary) hypertension: Secondary | ICD-10-CM | POA: Diagnosis not present

## 2019-07-06 DIAGNOSIS — I4510 Unspecified right bundle-branch block: Secondary | ICD-10-CM | POA: Diagnosis not present

## 2019-07-06 DIAGNOSIS — G4489 Other headache syndrome: Secondary | ICD-10-CM | POA: Diagnosis not present

## 2019-07-06 DIAGNOSIS — R410 Disorientation, unspecified: Secondary | ICD-10-CM | POA: Diagnosis not present

## 2019-07-18 DIAGNOSIS — D044 Carcinoma in situ of skin of scalp and neck: Secondary | ICD-10-CM | POA: Diagnosis not present

## 2019-08-06 NOTE — Progress Notes (Signed)
Cardiology Office Note    Date:  08/07/2019   ID:  [name], DOB November 05, 1934, MRN [id number]  PCP:  Jinny Sanders, MD  Cardiologist: Lauree Chandler, MD EPS: None  No chief complaint on file.   History of Present Illness:  [name] is a 85 y.o. male with history of CAD with history of stent to the LAD in 2005, CABG x4 in 2016, no ischemia on NST 01/2018 echo 01/2018 LVEF 60 to 65% with grade 1 DD mild MR, cardiac monitor at that time second-degree AV block and junctional bradycardia with frequent PACs beta-blocker was stopped, hypertension, HLD, GERD.  Last saw Dr. Angelena Form 09/2018 and blood pressure was elevated.  Lisinopril was increased to 40 mg daily with plans to add HCTZ or Norvasc if that did not control it.  Christmas night patient had an episode of unable to finish sentences. BP was 180/98, 209/99, BS 199. EMS said he was in/out of Afib and EKG brought from EMS showed NSR with PAC's, RBBB and inf Q waves. BP has been fine since. Patient does eat salt.  Says he has chest pain that feels like acute indigestion usually relieved with antacids and took one NTG with last week. Occurs once or twice a week.Not active for past 4 months.   Past Medical History:  Diagnosis Date  . CAD (coronary artery disease)    s/p stent mid LAD 2005 // s/p CABG 2016 // Myoview 8/19:  EF 64, no ischemia  . Coronary artery disease involving native coronary artery of native heart without angina pectoris   . Depression   . DJD (degenerative joint disease)   . DM type 2 with diabetic peripheral neuropathy ([place]) 03/10/2017  . E coli bacteremia 04/20/2016  . Essential hypertension 04/20/2016  . GERD (gastroesophageal reflux disease)   . History of echocardiogram    Echo 8/19: Moderate LVH, EF 60-65, normal wall motion, grade 1 diastolic dysfunction, mild MR, mild LAE, normal RVSF, mild TR, PASP 25  . History of hiatal hernia   . HTN (hypertension)   . Hyperlipidemia   . Neuropathy due to  type 2 diabetes mellitus ([place]) 03/10/2017  . PERCUTANEOUS TRANSLUMINAL CORONARY ANGIOPLASTY, HX OF 11/26/2007   Annotation: With CYPHER STENT. Qualifier: Diagnosis of  By: Collins, [place]    . Pre-diabetes   . S/P CABG x 4 11/03/2014  . S/P total knee replacement 04/11/2016  . Sepsis due to Escherichia coli (E. coli) ([place]) 04/20/2016  . Tinea pedis 09/08/2017    Past Surgical History:  Procedure Laterality Date  . BRAIN SURGERY    . CARDIAC CATHETERIZATION    . CORONARY ARTERY BYPASS GRAFT N/A 11/03/2014   Procedure: CORONARY ARTERY BYPASS GRAFTING (CABG), ON PUMP, TIMES FOUR, USING LEFT INTERNAL MAMMARY, RIGHT GREATER SAPHENOUS VEIN HARVESTED ENDOSCOPICALLY;  Surgeon: Ivin Poot, MD;  Location: [place];  Service: Open Heart Surgery;  Laterality: N/A;  LIMA-LAD; SVG-DIAG; SVG-OM; SVG-RCA  . CRANIOTOMY N/A 01/18/2014   Procedure: CRANIOTOMY HEMATOMA EVACUATION SUBDURAL;  Surgeon: Hosie Spangle, MD;  Location: [place];  Service: Neurosurgery;  Laterality: N/A;  . kidney stone removal    . KNEE SURGERY    . LEFT HEART CATHETERIZATION WITH CORONARY ANGIOGRAM N/A 07/27/2011   Procedure: LEFT HEART CATHETERIZATION WITH CORONARY ANGIOGRAM;  Surgeon: Burnell Blanks, MD;  Location: [facility] CATH LAB;  Service: Cardiovascular;  Laterality: N/A;  . LEFT HEART CATHETERIZATION WITH CORONARY ANGIOGRAM N/A 10/31/2014   Procedure: LEFT HEART CATHETERIZATION WITH  CORONARY ANGIOGRAM;  Surgeon: Sherren Mocha, MD;  Location: [facility] CATH LAB;  Service: Cardiovascular;  Laterality: N/A;  . PTCA     Hx of it.   Marland Kitchen SIGMOIDOSCOPY    . TEE WITHOUT CARDIOVERSION N/A 11/03/2014   Procedure: TRANSESOPHAGEAL ECHOCARDIOGRAM (TEE);  Surgeon: Ivin Poot, MD;  Location: [place];  Service: Open Heart Surgery;  Laterality: N/A;  . TOTAL KNEE ARTHROPLASTY  02/2011   Right knee  . TOTAL KNEE ARTHROPLASTY Left 04/11/2016   Procedure: LEFT TOTAL KNEE ARTHROPLASTY;  Surgeon: Vickey Huger, MD;  Location: [place];  Service:  Orthopedics;  Laterality: Left;    Current Medications: Current Meds  Medication Sig  . acetaminophen (TYLENOL) 500 MG tablet Take 500-1,000 mg by mouth 2 (two) times daily as needed (for pain).   Marland Kitchen clopidogrel (PLAVIX) 75 MG tablet Take 1 tablet by mouth once daily  . isosorbide mononitrate (IMDUR) 30 MG 24 hr tablet Take 1 tablet by mouth once daily  . lisinopril (PRINIVIL,ZESTRIL) 40 MG tablet Take 1 tablet (40 mg total) by mouth daily.  . Multiple Vitamins-Minerals (ONE-A-DAY MENS HEALTH FORMULA PO) Take by mouth.  . nitroGLYCERIN (NITROSTAT) 0.4 MG SL tablet Place 0.4 mg under the tongue every 5 (five) minutes as needed for chest pain.  . pantoprazole (PROTONIX) 40 MG tablet Take 1 tablet by mouth once daily  . rosuvastatin (CRESTOR) 5 MG tablet Take 1 tablet by mouth once daily     Allergies:   Protamine, Statins, Rosuvastatin calcium, and Imodium [loperamide]   Social History   Socioeconomic History  . Marital status: Married    Spouse name: Not on file  . Number of children: Not on file  . Years of education: Not on file  . Highest education level: Not on file  Occupational History  . Not on file  Tobacco Use  . Smoking status: Former Research scientist (life sciences)  . Smokeless tobacco: Former Systems developer  . Tobacco comment: quit in 1985. smoked 1ppd for 35 years   Substance and Sexual Activity  . Alcohol use: No  . Drug use: No  . Sexual activity: Never  Other Topics Concern  . Not on file  Social History Narrative   Married with children. 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 10:20 am    Has living will,  HCPOA: jerrie [place], full code (reviewed 76)   Social Determinants of Health   Financial Resource Strain:   . Difficulty of Paying Living Expenses: Not on file  Food Insecurity:   . Worried About Charity fundraiser in the Last Year: Not on file  . Ran Out of Food in the Last Year: Not on file  Transportation Needs:   . Lack of  Transportation (Medical): Not on file  . Lack of Transportation (Non-Medical): Not on file  Physical Activity:   . Days of Exercise per Week: Not on file  . Minutes of Exercise per Session: Not on file  Stress:   . Feeling of Stress : Not on file  Social Connections:   . Frequency of Communication with Friends and Family: Not on file  . Frequency of Social Gatherings with Friends and Family: Not on file  . Attends Religious Services: Not on file  . Active Member of Clubs or Organizations: Not on file  . Attends Archivist Meetings: Not on file  . Marital Status: Not on file     Family History:  The patient's family history includes COPD in  his mother; Heart attack in his brother, father, and sister; Heart disease in his father and sister; Heart failure in his mother.   ROS:   Please see the history of present illness.    ROS All other systems reviewed and are negative.   PHYSICAL EXAM:   VS:  BP (!) 154/88   Pulse 78   Ht 5\' 6"  (1.676 m)   Wt 184 lb (83.5 kg)   SpO2 96%   BMI 29.70 kg/m   Physical Exam  GEN: Well nourished, well developed, in no acute distress  Neck: no JVD, carotid bruits, or masses Cardiac:RRR; no murmurs, rubs, or gallops  Respiratory:  clear to auscultation bilaterally, normal work of breathing GI: soft, nontender, nondistended, + BS Ext: without cyanosis, clubbing, or edema, Good distal pulses bilaterally Neuro:  Alert and Oriented x 3 Psych: euthymic mood, full affect  Wt Readings from Last 3 Encounters:  08/07/19 184 lb (83.5 kg)  03/22/19 178 lb 4 oz (80.9 kg)  09/18/18 185 lb 4 oz (84 kg)      Studies/Labs Reviewed:   EKG:  EKG is ordered today.  The ekg ordered today demonstrates normal sinus rhythm inferior Q waves right bundle branch block, no acute change  Recent Labs: 03/13/2019: ALT 12; BUN 19; Creatinine, Ser 0.84; Potassium 3.6; Sodium 142   Lipid Panel    Component Value Date/Time   CHOL 113 03/13/2019 0902   CHOL  122 09/22/2016 0912   TRIG 70.0 03/13/2019 0902   HDL 38.90 (L) 03/13/2019 0902   HDL 39 (L) 09/22/2016 0912   CHOLHDL 3 03/13/2019 0902   VLDL 14.0 03/13/2019 0902   LDLCALC 60 03/13/2019 0902   LDLCALC 63 09/22/2016 0912    Additional studies/ records that were reviewed today include:  Echo 02/07/18 Moderate LVH, EF 60-65, normal wall motion, grade 1 diastolic dysfunction, trivial AI, mild MR, mild LAE, mild TR, PASP 25   48 Hr Holter 02/07/18 Type 2 second degree AV block Junctional bradycardia.  Frequent Premature atrial contractions (3898 during monitoring period) Rare premature ventricular contractions    Nuclear stress test 01/10/18  Nuclear stress EF: 64% with septal wall hypokinesis  There was no ST segment deviation noted during stress.  The study is normal.  This is a low risk study. No ischemia identified.       ASSESSMENT:    1. Coronary artery disease involving coronary bypass graft of native heart without angina pectoris   2. Essential hypertension   3. Hyperlipidemia, unspecified hyperlipidemia type   4. History of bradycardia   5. Second degree AV block      PLAN:  In order of problems listed above:  CAD status post stenting into the LAD in 2005, CABG times 10/2014, NST 01/2018 no ischemia, on Plavix Imdur Crestor and aspirin-patient does have some chest pain but difficult to discern whether it is indigestion or cardiac.  Always associated with elevated blood pressures which have been high.  Will add amlodipine 5 mg once daily.  Check 2D echo.  Follow-up with Dr. Angelena Form as scheduled in March.  Essential hypertension blood pressure has been running high.  He does get extra salt in his diet.  Add amlodipine 5 mg once daily.  2 g sodium diet.  Hyperlipidemia LDL 60 03/2019  History of bradycardia and second-degree AV block stabilized off beta-blocker-palpitations.  Patient had EMS come out at Christmas and was told he was going in and out of atrial  fibrillation.  EKG they  brought today shows normal sinus rhythm with PACs.  Will place Holter monitor to make sure he is not having A. fib.     Medication Adjustments/Labs and Tests Ordered: Current medicines are reviewed at length with the patient today.  Concerns regarding medicines are outlined above.  Medication changes, Labs and Tests ordered today are listed in the Patient Instructions below. Patient Instructions   Medication Instructions:  Your physician has recommended you make the following change in your medication:  1.  START Amlodipine   *If you need a refill on your cardiac medications before your next appointment, please call your pharmacy*  Lab Work: None ordered  If you have labs (blood work) drawn today and your tests are completely normal, you will receive your results only by: Marland Kitchen MyChart Message (if you have MyChart) OR . A paper copy in the mail If you have any lab test that is abnormal or we need to change your treatment, we will call you to review the results.  Testing/Procedures: Your physician has requested that you have an echocardiogram. Echocardiography is a painless test that uses sound waves to create images of your heart. It provides your doctor with information about the size and shape of your heart and how well your heart's chambers and valves are working. This procedure takes approximately one hour. There are no restrictions for this procedure.  Your physician has recommended that you wear an event monitor. Event monitors are medical devices that record the heart's electrical activity. Doctors most often [place] these monitors to diagnose arrhythmias. Arrhythmias are problems with the speed or rhythm of the heartbeat. The monitor is a small, portable device. You can wear one while you do your normal daily activities. This is usually used to diagnose what is causing palpitations/syncope (passing out).    Follow-Up: At [facility], you and your health needs  are our priority.  As part of our continuing mission to provide you with exceptional heart care, we have created designated Provider Care Teams.  These Care Teams include your primary Cardiologist (physician) and Advanced Practice Providers (APPs -  Physician Assistants and Nurse Practitioners) who all work together to provide you with the care you need, when you need it.  Your next appointment:    09/13/2019 ARRIVE AT 1:25 TO SEE DR. [facility]  The format for your next appointment:   In Person  Provider:   Lauree Chandler, MD  Other Instructions  Two Gram Sodium Diet 2000 mg  What is Sodium? Sodium is a mineral found naturally in many foods. The most significant source of sodium in the diet is table salt, which is about 40% sodium.  Processed, convenience, and preserved foods also contain a large amount of sodium.  The body needs only 500 mg of sodium daily to function,  A normal diet provides more than enough sodium even if you do not use salt.  Why Limit Sodium? A build up of sodium in the body can cause thirst, increased blood pressure, shortness of breath, and water retention.  Decreasing sodium in the diet can reduce edema and risk of heart attack or stroke associated with high blood pressure.  Keep in mind that there are many other factors involved in these health problems.  Heredity, obesity, lack of exercise, cigarette smoking, stress and what you eat all play a role.  General Guidelines:  Do not add salt at the table or in cooking.  One teaspoon of salt contains over 2 grams of sodium.  Read food  labels  Avoid processed and convenience foods  Ask your dietitian before eating any foods not dicussed in the menu planning guidelines  Consult your physician if you wish to use a salt substitute or a sodium containing medication such as antacids.  Limit milk and milk products to 16 oz (2 cups) per day.  Shopping Hints:  READ LABELS!! "Dietetic" does not necessarily mean low  sodium.  Salt and other sodium ingredients are often added to foods during processing.   Menu Planning Guidelines Food Group Choose More Often Avoid  Beverages (see also the milk group All fruit juices, low-sodium, salt-free vegetables juices, low-sodium carbonated beverages Regular vegetable or tomato juices, commercially softened water used for drinking or cooking  Breads and Cereals Enriched white, wheat, rye and pumpernickel bread, hard rolls and dinner rolls; muffins, cornbread and waffles; most dry cereals, cooked cereal without added salt; unsalted crackers and breadsticks; low sodium or homemade bread crumbs Bread, rolls and crackers with salted tops; quick breads; instant hot cereals; pancakes; commercial bread stuffing; self-rising flower and biscuit mixes; regular bread crumbs or cracker crumbs  Desserts and Sweets Desserts and sweets mad with mild should be within allowance Instant pudding mixes and cake mixes  Fats Butter or margarine; vegetable oils; unsalted salad dressings, regular salad dressings limited to 1 Tbs; light, sour and heavy cream Regular salad dressings containing bacon fat, bacon bits, and salt pork; snack dips made with instant soup mixes or processed cheese; salted nuts  Fruits Most fresh, frozen and canned fruits Fruits processed with salt or sodium-containing ingredient (some dried fruits are processed with sodium sulfites        Vegetables Fresh, frozen vegetables and low- sodium canned vegetables Regular canned vegetables, sauerkraut, pickled vegetables, and others prepared in brine; frozen vegetables in sauces; vegetables seasoned with ham, bacon or salt pork  Condiments, Sauces, Miscellaneous  Salt substitute with physician's approval; pepper, herbs, spices; vinegar, lemon or lime juice; hot pepper sauce; garlic powder, onion powder, low sodium soy sauce (1 Tbs.); low sodium condiments (ketchup, chili sauce, mustard) in limited amounts (1 tsp.) fresh ground  horseradish; unsalted tortilla chips, pretzels, potato chips, popcorn, salsa (1/4 cup) Any seasoning made with salt including garlic salt, celery salt, onion salt, and seasoned salt; sea salt, rock salt, kosher salt; meat tenderizers; monosodium glutamate; mustard, regular soy sauce, barbecue, sauce, chili sauce, teriyaki sauce, steak sauce, Worcestershire sauce, and most flavored vinegars; canned gravy and mixes; regular condiments; salted snack foods, olives, picles, relish, horseradish sauce, catsup   Food preparation: Try these seasonings Meats:    Pork Sage, onion Serve with applesauce  Chicken Poultry seasoning, thyme, parsley Serve with cranberry sauce  Lamb Curry powder, rosemary, garlic, thyme Serve with mint sauce or jelly  Veal Marjoram, basil Serve with current jelly, cranberry sauce  Beef Pepper, bay leaf Serve with dry mustard, unsalted chive butter  Fish Bay leaf, dill Serve with unsalted lemon butter, unsalted parsley butter  Vegetables:    Asparagus Lemon juice   Broccoli Lemon juice   Carrots Mustard dressing parsley, mint, nutmeg, glazed with unsalted butter and sugar   Green beans Marjoram, lemon juice, nutmeg,dill seed   Tomatoes Basil, marjoram, onion   Spice /blend for [employer]" 4 tsp ground thyme 1 tsp ground sage 3 tsp ground rosemary 4 tsp ground marjoram   Test your knowledge 1. A product that says "Salt Free" may still contain sodium. True or False 2. Garlic Powder and Hot Pepper Sauce an be used as  alternative seasonings.True or False 3. Processed foods have more sodium than fresh foods.  True or False 4. Canned Vegetables have less sodium than froze True or False  WAYS TO DECREASE YOUR SODIUM INTAKE 1. Avoid the use of added salt in cooking and at the table.  Table salt (and other prepared seasonings which contain salt) is probably one of the greatest sources of sodium in the diet.  Unsalted foods can gain flavor from the sweet, sour, and butter taste  sensations of herbs and spices.  Instead of using salt for seasoning, try the following seasonings with the foods listed.  Remember: how you use them to enhance natural food flavors is limited only by your creativity... Allspice-Meat, fish, eggs, fruit, peas, red and yellow vegetables Almond Extract-Fruit baked goods Anise Seed-Sweet breads, fruit, carrots, beets, cottage cheese, cookies (tastes like licorice) Basil-Meat, fish, eggs, vegetables, rice, vegetables salads, soups, sauces Bay Leaf-Meat, fish, stews, poultry Burnet-Salad, vegetables (cucumber-like flavor) Caraway Seed-Bread, cookies, cottage cheese, meat, vegetables, cheese, rice Cardamon-Baked goods, fruit, soups Celery Powder or seed-Salads, salad dressings, sauces, meatloaf, soup, bread.Do not use  celery salt Chervil-Meats, salads, fish, eggs, vegetables, cottage cheese (parsley-like flavor) Chili Power-Meatloaf, chicken cheese, corn, eggplant, egg dishes Chives-Salads cottage cheese, egg dishes, soups, vegetables, sauces Cilantro-Salsa, casseroles Cinnamon-Baked goods, fruit, pork, lamb, chicken, carrots Cloves-Fruit, baked goods, fish, pot roast, green beans, beets, carrots Coriander-Pastry, cookies, meat, salads, cheese (lemon-orange flavor) Cumin-Meatloaf, fish,cheese, eggs, cabbage,fruit pie (caraway flavor) [employer], fruit, eggs, fish, poultry, cottage cheese, vegetables Dill Seed-Meat, cottage cheese, poultry, vegetables, fish, salads, bread Fennel Seed-Bread, cookies, apples, pork, eggs, fish, beets, cabbage, cheese, Licorice-like flavor Garlic-(buds or powder) Salads, meat, poultry, fish, bread, butter, vegetables, potatoes.Do not  use garlic salt Ginger-Fruit, vegetables, baked goods, meat, fish, poultry Horseradish Root-Meet, vegetables, butter Lemon Juice or Extract-Vegetables, fruit, tea, baked goods, fish salads Mace-Baked goods fruit, vegetables, fish, poultry (taste like nutmeg) Maple  Extract-Syrups Marjoram-Meat, chicken, fish, vegetables, breads, green salads (taste like Sage) Mint-Tea, lamb, sherbet, vegetables, desserts, carrots, cabbage Mustard, Dry or Seed-Cheese, eggs, meats, vegetables, poultry Nutmeg-Baked goods, fruit, chicken, eggs, vegetables, desserts Onion Powder-Meat, fish, poultry, vegetables, cheese, eggs, bread, rice salads (Do not use   Onion salt) Orange Extract-Desserts, baked goods Oregano-Pasta, eggs, cheese, onions, pork, lamb, fish, chicken, vegetables, green salads Paprika-Meat, fish, poultry, eggs, cheese, vegetables Parsley Flakes-Butter, vegetables, meat fish, poultry, eggs, bread, salads (certain forms may   Contain sodium Pepper-Meat fish, poultry, vegetables, eggs Peppermint Extract-Desserts, baked goods Poppy Seed-Eggs, bread, cheese, fruit dressings, baked goods, noodles, vegetables, cottage  [employer], poultry, meat, fish, cauliflower, turnips,eggs bread Saffron-Rice, bread, veal, chicken, fish, eggs Sage-Meat, fish, poultry, onions, eggplant, tomateos, pork, stews Savory-Eggs, salads, poultry, meat, rice, vegetables, soups, pork Tarragon-Meat, poultry, fish, eggs, butter, vegetables (licorice-like flavor)  Thyme-Meat, poultry, fish, eggs, vegetables, (clover-like flavor), sauces, soups Tumeric-Salads, butter, eggs, fish, rice, vegetables (saffron-like flavor) Vanilla Extract-Baked goods, candy Vinegar-Salads, vegetables, meat marinades Walnut Extract-baked goods, candy  2. Choose your Foods Wisely   The following is a list of foods to avoid which are high in sodium:  Meats-Avoid all smoked, canned, salt cured, dried and kosher meat and fish as well as Anchovies   [employer] meats:Bologna, Liverwurst, Pastrami Canned meat or fish  Marinated herring Caviar    Pepperoni Corned Beef   Pizza Dried chipped beef  Salami Frozen breaded fish or meat Salt pork Frankfurters or hot  dogs  Sardines Gefilte fish   Sausage Ham (boiled ham,  Proscuitto Smoked butt    spiced ham)   Spam      TV Dinners Vegetables Canned vegetables (Regular) Relish Canned mushrooms  Sauerkraut Olives    Tomato juice Pickles  Bakery and Dessert Products Canned puddings  Cream pies Cheesecake   Decorated cakes Cookies  Beverages/Juices Tomato juice, regular  Gatorade   V-8 vegetable juice, regular  Breads and Cereals Biscuit mixes   Salted potato chips, corn chips, pretzels Bread stuffing mixes  Salted crackers and rolls Pancake and waffle mixes Self-rising flour  Seasonings Accent    Meat sauces Barbecue sauce  Meat tenderizer Catsup    Monosodium glutamate (MSG) Celery salt   Onion salt Chili sauce   Prepared mustard Garlic salt   Salt, seasoned salt, sea salt Gravy mixes   Soy sauce Horseradish   Steak sauce Ketchup   Tartar sauce Lite salt    Teriyaki sauce Marinade mixes   Worcestershire sauce  Others Baking powder   Cocoa and cocoa mixes Baking soda   Commercial casserole mixes Candy-caramels, chocolate  Dehydrated soups    Bars, fudge,nougats  Instant rice and pasta mixes Canned broth or soup  Maraschino cherries Cheese, aged and processed cheese and cheese spreads  Learning Assessment Quiz  Indicated T (for True) or F (for False) for each of the following statements:  1. _____ Fresh fruits and vegetables and unprocessed grains are generally low in sodium 2. _____ Water may contain a considerable amount of sodium, depending on the source 3. _____ You can always tell if a food is high in sodium by tasting it 4. _____ Certain laxatives my be high in sodium and should be avoided unless prescribed   by a physician or pharmacist 5. _____ Salt substitutes may be used freely by anyone on a sodium restricted diet 6. _____ Sodium is present in table salt, food additives and as a natural component of   most foods 7. _____ Table salt is approximately 90%  sodium 8. _____ Limiting sodium intake may help prevent excess fluid accumulation in the body 9. _____ On a sodium-restricted diet, seasonings such as bouillon soy sauce, and    cooking wine should be used in place of table salt 10. _____ On an ingredient list, a product which lists monosodium glutamate as the first   ingredient is an appropriate food to include on a low sodium diet  Circle the best answer(s) to the following statements (Hint: there may be more than one correct answer)  11. On a low-sodium diet, some acceptable snack items are:    A. Olives  F. Bean dip   K. Grapefruit juice    B. Salted Pretzels G. Commercial Popcorn   L. Canned peaches    C. Carrot Sticks  H. Bouillon   M. Unsalted nuts   D. [place] fries  I. Peanut butter crackers N. Salami   E. Sweet pickles J. Tomato Juice   O. Pizza  12.  Seasonings that may be used freely on a reduced - sodium diet include   A. Lemon wedges F.Monosodium glutamate K. Celery seed    B.Soysauce   G. Pepper   L. Mustard powder   C. Sea salt  H. Cooking wine  M. Onion flakes   D. Vinegar  E. Prepared horseradish N. Salsa   E. Sage   J. Worcestershire sauce  O. Chutney      Signed, Ermalinda Barrios, PA-C  08/07/2019 10:29 AM    [facility] [id number]  [address], [place], [place]  [address] Phone: [phone]; Fax: [phone]

## 2019-08-07 ENCOUNTER — Telehealth: Payer: Self-pay | Admitting: Radiology

## 2019-08-07 ENCOUNTER — Other Ambulatory Visit: Payer: Self-pay

## 2019-08-07 ENCOUNTER — Encounter: Payer: Self-pay | Admitting: Physician Assistant

## 2019-08-07 ENCOUNTER — Ambulatory Visit: Payer: Medicare HMO | Admitting: Physician Assistant

## 2019-08-07 VITALS — BP 154/88 | HR 78 | Ht 66.0 in | Wt 184.0 lb

## 2019-08-07 DIAGNOSIS — I10 Essential (primary) hypertension: Secondary | ICD-10-CM | POA: Diagnosis not present

## 2019-08-07 DIAGNOSIS — Z87898 Personal history of other specified conditions: Secondary | ICD-10-CM | POA: Diagnosis not present

## 2019-08-07 DIAGNOSIS — I25810 Atherosclerosis of coronary artery bypass graft(s) without angina pectoris: Secondary | ICD-10-CM | POA: Diagnosis not present

## 2019-08-07 DIAGNOSIS — E785 Hyperlipidemia, unspecified: Secondary | ICD-10-CM | POA: Diagnosis not present

## 2019-08-07 DIAGNOSIS — I441 Atrioventricular block, second degree: Secondary | ICD-10-CM

## 2019-08-07 NOTE — Telephone Encounter (Signed)
Enrolled patient for a 30 day [employer] Event monitor to be mailed to patients home.  

## 2019-08-07 NOTE — Patient Instructions (Addendum)
Medication Instructions:  Your physician has recommended you make the following change in your medication:  1.  START Amlodipine   *If you need a refill on your cardiac medications before your next appointment, please call your pharmacy*  Lab Work: None ordered  If you have labs (blood work) drawn today and your tests are completely normal, you will receive your results only by: Marland Kitchen MyChart Message (if you have MyChart) OR . A paper copy in the mail If you have any lab test that is abnormal or we need to change your treatment, we will call you to review the results.  Testing/Procedures: Your physician has requested that you have an echocardiogram. Echocardiography is a painless test that uses sound waves to create images of your heart. It provides your doctor with information about the size and shape of your heart and how well your heart's chambers and valves are working. This procedure takes approximately one hour. There are no restrictions for this procedure.  Your physician has recommended that you wear an event monitor. Event monitors are medical devices that record the heart's electrical activity. Doctors most often [place] these monitors to diagnose arrhythmias. Arrhythmias are problems with the speed or rhythm of the heartbeat. The monitor is a small, portable device. You can wear one while you do your normal daily activities. This is usually used to diagnose what is causing palpitations/syncope (passing out).    Follow-Up: At [facility], you and your health needs are our priority.  As part of our continuing mission to provide you with exceptional heart care, we have created designated Provider Care Teams.  These Care Teams include your primary Cardiologist (physician) and Advanced Practice Providers (APPs -  Physician Assistants and Nurse Practitioners) who all work together to provide you with the care you need, when you need it.  Your next appointment:    09/13/2019 ARRIVE AT 1:25 TO  SEE [facility]. [facility]  The format for your next appointment:   In Person  Provider:   Lauree Chandler, MD  Other Instructions  Two Gram Sodium Diet 2000 mg  What is Sodium? Sodium is a mineral found naturally in many foods. The most significant source of sodium in the diet is table salt, which is about 40% sodium.  Processed, convenience, and preserved foods also contain a large amount of sodium.  The body needs only 500 mg of sodium daily to function,  A normal diet provides more than enough sodium even if you do not use salt.  Why Limit Sodium? A build up of sodium in the body can cause thirst, increased blood pressure, shortness of breath, and water retention.  Decreasing sodium in the diet can reduce edema and risk of heart attack or stroke associated with high blood pressure.  Keep in mind that there are many other factors involved in these health problems.  Heredity, obesity, lack of exercise, cigarette smoking, stress and what you eat all play a role.  General Guidelines:  Do not add salt at the table or in cooking.  One teaspoon of salt contains over 2 grams of sodium.  Read food labels  Avoid processed and convenience foods  Ask your dietitian before eating any foods not dicussed in the menu planning guidelines  Consult your physician if you wish to use a salt substitute or a sodium containing medication such as antacids.  Limit milk and milk products to 16 oz (2 cups) per day.  Shopping Hints:  READ LABELS!! "Dietetic" does not necessarily mean low  sodium.  Salt and other sodium ingredients are often added to foods during processing.   Menu Planning Guidelines Food Group Choose More Often Avoid  Beverages (see also the milk group All fruit juices, low-sodium, salt-free vegetables juices, low-sodium carbonated beverages Regular vegetable or tomato juices, commercially softened water used for drinking or cooking  Breads and Cereals Enriched white, wheat, rye and  pumpernickel bread, hard rolls and dinner rolls; muffins, cornbread and waffles; most dry cereals, cooked cereal without added salt; unsalted crackers and breadsticks; low sodium or homemade bread crumbs Bread, rolls and crackers with salted tops; quick breads; instant hot cereals; pancakes; commercial bread stuffing; self-rising flower and biscuit mixes; regular bread crumbs or cracker crumbs  Desserts and Sweets Desserts and sweets mad with mild should be within allowance Instant pudding mixes and cake mixes  Fats Butter or margarine; vegetable oils; unsalted salad dressings, regular salad dressings limited to 1 Tbs; light, sour and heavy cream Regular salad dressings containing bacon fat, bacon bits, and salt pork; snack dips made with instant soup mixes or processed cheese; salted nuts  Fruits Most fresh, frozen and canned fruits Fruits processed with salt or sodium-containing ingredient (some dried fruits are processed with sodium sulfites        Vegetables Fresh, frozen vegetables and low- sodium canned vegetables Regular canned vegetables, sauerkraut, pickled vegetables, and others prepared in brine; frozen vegetables in sauces; vegetables seasoned with ham, bacon or salt pork  Condiments, Sauces, Miscellaneous  Salt substitute with physician's approval; pepper, herbs, spices; vinegar, lemon or lime juice; hot pepper sauce; garlic powder, onion powder, low sodium soy sauce (1 Tbs.); low sodium condiments (ketchup, chili sauce, mustard) in limited amounts (1 tsp.) fresh ground horseradish; unsalted tortilla chips, pretzels, potato chips, popcorn, salsa (1/4 cup) Any seasoning made with salt including garlic salt, celery salt, onion salt, and seasoned salt; sea salt, rock salt, kosher salt; meat tenderizers; monosodium glutamate; mustard, regular soy sauce, barbecue, sauce, chili sauce, teriyaki sauce, steak sauce, Worcestershire sauce, and most flavored vinegars; canned gravy and mixes; regular  condiments; salted snack foods, olives, picles, relish, horseradish sauce, catsup   Food preparation: Try these seasonings Meats:    Pork Sage, onion Serve with applesauce  Chicken Poultry seasoning, thyme, parsley Serve with cranberry sauce  Lamb Curry powder, rosemary, garlic, thyme Serve with mint sauce or jelly  Veal Marjoram, basil Serve with current jelly, cranberry sauce  Beef Pepper, bay leaf Serve with dry mustard, unsalted chive butter  Fish Bay leaf, dill Serve with unsalted lemon butter, unsalted parsley butter  Vegetables:    Asparagus Lemon juice   Broccoli Lemon juice   Carrots Mustard dressing parsley, mint, nutmeg, glazed with unsalted butter and sugar   Green beans Marjoram, lemon juice, nutmeg,dill seed   Tomatoes Basil, marjoram, onion   Spice /blend for [employer]" 4 tsp ground thyme 1 tsp ground sage 3 tsp ground rosemary 4 tsp ground marjoram   Test your knowledge 1. A product that says "Salt Free" may still contain sodium. True or False 2. Garlic Powder and Hot Pepper Sauce an be used as alternative seasonings.True or False 3. Processed foods have more sodium than fresh foods.  True or False 4. Canned Vegetables have less sodium than froze True or False  WAYS TO DECREASE YOUR SODIUM INTAKE 1. Avoid the use of added salt in cooking and at the table.  Table salt (and other prepared seasonings which contain salt) is probably one of the greatest sources of sodium  in the diet.  Unsalted foods can gain flavor from the sweet, sour, and butter taste sensations of herbs and spices.  Instead of using salt for seasoning, try the following seasonings with the foods listed.  Remember: how you use them to enhance natural food flavors is limited only by your creativity... Allspice-Meat, fish, eggs, fruit, peas, red and yellow vegetables Almond Extract-Fruit baked goods Anise Seed-Sweet breads, fruit, carrots, beets, cottage cheese, cookies (tastes like licorice) Basil-Meat,  fish, eggs, vegetables, rice, vegetables salads, soups, sauces Bay Leaf-Meat, fish, stews, poultry Burnet-Salad, vegetables (cucumber-like flavor) Caraway Seed-Bread, cookies, cottage cheese, meat, vegetables, cheese, rice Cardamon-Baked goods, fruit, soups Celery Powder or seed-Salads, salad dressings, sauces, meatloaf, soup, bread.Do not use  celery salt Chervil-Meats, salads, fish, eggs, vegetables, cottage cheese (parsley-like flavor) Chili Power-Meatloaf, chicken cheese, corn, eggplant, egg dishes Chives-Salads cottage cheese, egg dishes, soups, vegetables, sauces Cilantro-Salsa, casseroles Cinnamon-Baked goods, fruit, pork, lamb, chicken, carrots Cloves-Fruit, baked goods, fish, pot roast, green beans, beets, carrots Coriander-Pastry, cookies, meat, salads, cheese (lemon-orange flavor) Cumin-Meatloaf, fish,cheese, eggs, cabbage,fruit pie (caraway flavor) [employer], fruit, eggs, fish, poultry, cottage cheese, vegetables Dill Seed-Meat, cottage cheese, poultry, vegetables, fish, salads, bread Fennel Seed-Bread, cookies, apples, pork, eggs, fish, beets, cabbage, cheese, Licorice-like flavor Garlic-(buds or powder) Salads, meat, poultry, fish, bread, butter, vegetables, potatoes.Do not  use garlic salt Ginger-Fruit, vegetables, baked goods, meat, fish, poultry Horseradish Root-Meet, vegetables, butter Lemon Juice or Extract-Vegetables, fruit, tea, baked goods, fish salads Mace-Baked goods fruit, vegetables, fish, poultry (taste like nutmeg) Maple Extract-Syrups Marjoram-Meat, chicken, fish, vegetables, breads, green salads (taste like Sage) Mint-Tea, lamb, sherbet, vegetables, desserts, carrots, cabbage Mustard, Dry or Seed-Cheese, eggs, meats, vegetables, poultry Nutmeg-Baked goods, fruit, chicken, eggs, vegetables, desserts Onion Powder-Meat, fish, poultry, vegetables, cheese, eggs, bread, rice salads (Do not use   Onion salt) Orange Extract-Desserts, baked  goods Oregano-Pasta, eggs, cheese, onions, pork, lamb, fish, chicken, vegetables, green salads Paprika-Meat, fish, poultry, eggs, cheese, vegetables Parsley Flakes-Butter, vegetables, meat fish, poultry, eggs, bread, salads (certain forms may   Contain sodium Pepper-Meat fish, poultry, vegetables, eggs Peppermint Extract-Desserts, baked goods Poppy Seed-Eggs, bread, cheese, fruit dressings, baked goods, noodles, vegetables, cottage  [employer], poultry, meat, fish, cauliflower, turnips,eggs bread Saffron-Rice, bread, veal, chicken, fish, eggs Sage-Meat, fish, poultry, onions, eggplant, tomateos, pork, stews Savory-Eggs, salads, poultry, meat, rice, vegetables, soups, pork Tarragon-Meat, poultry, fish, eggs, butter, vegetables (licorice-like flavor)  Thyme-Meat, poultry, fish, eggs, vegetables, (clover-like flavor), sauces, soups Tumeric-Salads, butter, eggs, fish, rice, vegetables (saffron-like flavor) Vanilla Extract-Baked goods, candy Vinegar-Salads, vegetables, meat marinades Walnut Extract-baked goods, candy  2. Choose your Foods Wisely   The following is a list of foods to avoid which are high in sodium:  Meats-Avoid all smoked, canned, salt cured, dried and kosher meat and fish as well as Anchovies   [employer] meats:Bologna, Liverwurst, Pastrami Canned meat or fish  Marinated herring Caviar    Pepperoni Corned Beef   Pizza Dried chipped beef  Salami Frozen breaded fish or meat Salt pork Frankfurters or hot dogs  Sardines Gefilte fish   Sausage Ham (boiled ham, Proscuitto Smoked butt    spiced ham)   Spam      TV Dinners Vegetables Canned vegetables (Regular) Relish Canned mushrooms  Sauerkraut Olives    Tomato juice Pickles  Bakery and Dessert Products Canned puddings  Cream pies Cheesecake   Decorated cakes Cookies  Beverages/Juices Tomato juice, regular  Gatorade   V-8 vegetable juice, regular  Breads and  Cereals  Biscuit mixes   Salted potato chips, corn chips, pretzels Bread stuffing mixes  Salted crackers and rolls Pancake and waffle mixes Self-rising flour  Seasonings Accent    Meat sauces Barbecue sauce  Meat tenderizer Catsup    Monosodium glutamate (MSG) Celery salt   Onion salt Chili sauce   Prepared mustard Garlic salt   Salt, seasoned salt, sea salt Gravy mixes   Soy sauce Horseradish   Steak sauce Ketchup   Tartar sauce Lite salt    Teriyaki sauce Marinade mixes   Worcestershire sauce  Others Baking powder   Cocoa and cocoa mixes Baking soda   Commercial casserole mixes Candy-caramels, chocolate  Dehydrated soups    Bars, fudge,nougats  Instant rice and pasta mixes Canned broth or soup  Maraschino cherries Cheese, aged and processed cheese and cheese spreads  Learning Assessment Quiz  Indicated T (for True) or F (for False) for each of the following statements:  1. _____ Fresh fruits and vegetables and unprocessed grains are generally low in sodium 2. _____ Water may contain a considerable amount of sodium, depending on the source 3. _____ You can always tell if a food is high in sodium by tasting it 4. _____ Certain laxatives my be high in sodium and should be avoided unless prescribed   by a physician or pharmacist 5. _____ Salt substitutes may be used freely by anyone on a sodium restricted diet 6. _____ Sodium is present in table salt, food additives and as a natural component of   most foods 7. _____ Table salt is approximately 90% sodium 8. _____ Limiting sodium intake may help prevent excess fluid accumulation in the body 9. _____ On a sodium-restricted diet, seasonings such as bouillon soy sauce, and    cooking wine should be used in place of table salt 10. _____ On an ingredient list, a product which lists monosodium glutamate as the first   ingredient is an appropriate food to include on a low sodium diet  Circle the best answer(s) to the following  statements (Hint: there may be more than one correct answer)  11. On a low-sodium diet, some acceptable snack items are:    A. Olives  F. Bean dip   K. Grapefruit juice    B. Salted Pretzels G. Commercial Popcorn   L. Canned peaches    C. Carrot Sticks  H. Bouillon   M. Unsalted nuts   D. [place] fries  I. Peanut butter crackers N. Salami   E. Sweet pickles J. Tomato Juice   O. Pizza  12.  Seasonings that may be used freely on a reduced - sodium diet include   A. Lemon wedges F.Monosodium glutamate K. Celery seed    B.Soysauce   G. Pepper   L. Mustard powder   C. Sea salt  H. Cooking wine  M. Onion flakes   D. Vinegar  E. Prepared horseradish N. Salsa   E. Sage   J. Worcestershire sauce  O. Chutney

## 2019-08-13 ENCOUNTER — Telehealth: Payer: Self-pay | Admitting: Physician Assistant

## 2019-08-13 MED ORDER — AMLODIPINE BESYLATE 5 MG PO TABS: 5.0000 mg | ORAL_TABLET | Freq: Every day | ORAL | 3 refills | Status: DC

## 2019-08-13 NOTE — Telephone Encounter (Signed)
Called patient and informed that amlodipine 5 mg once day has been sent to pharmacy.

## 2019-08-13 NOTE — Telephone Encounter (Signed)
Pt calling stating at [place] on 08/07/19 with Estella Husk, PA, that she was going to start him on Amlodipine 5 mg tablet. This medication was not sent to pt's pharmacy. Pt would like a call concerning this matter. Please address

## 2019-08-13 NOTE — Telephone Encounter (Signed)
Thank you :)

## 2019-08-15 ENCOUNTER — Ambulatory Visit (INDEPENDENT_AMBULATORY_CARE_PROVIDER_SITE_OTHER): Payer: Medicare HMO

## 2019-08-15 DIAGNOSIS — I441 Atrioventricular block, second degree: Secondary | ICD-10-CM

## 2019-08-15 DIAGNOSIS — E785 Hyperlipidemia, unspecified: Secondary | ICD-10-CM

## 2019-08-15 DIAGNOSIS — I25810 Atherosclerosis of coronary artery bypass graft(s) without angina pectoris: Secondary | ICD-10-CM | POA: Diagnosis not present

## 2019-08-15 DIAGNOSIS — I10 Essential (primary) hypertension: Secondary | ICD-10-CM

## 2019-08-15 DIAGNOSIS — Z87898 Personal history of other specified conditions: Secondary | ICD-10-CM

## 2019-08-16 ENCOUNTER — Other Ambulatory Visit: Payer: Self-pay

## 2019-08-16 ENCOUNTER — Ambulatory Visit (HOSPITAL_COMMUNITY): Payer: Medicare HMO | Attending: Physician Assistant

## 2019-08-16 DIAGNOSIS — E785 Hyperlipidemia, unspecified: Secondary | ICD-10-CM | POA: Insufficient documentation

## 2019-08-16 DIAGNOSIS — I25810 Atherosclerosis of coronary artery bypass graft(s) without angina pectoris: Secondary | ICD-10-CM | POA: Insufficient documentation

## 2019-08-16 DIAGNOSIS — I441 Atrioventricular block, second degree: Secondary | ICD-10-CM | POA: Diagnosis not present

## 2019-08-16 DIAGNOSIS — I10 Essential (primary) hypertension: Secondary | ICD-10-CM | POA: Diagnosis not present

## 2019-08-16 DIAGNOSIS — Z87898 Personal history of other specified conditions: Secondary | ICD-10-CM | POA: Diagnosis not present

## 2019-08-20 ENCOUNTER — Telehealth: Payer: Self-pay

## 2019-08-20 DIAGNOSIS — Z9622 Myringotomy tube(s) status: Secondary | ICD-10-CM

## 2019-08-20 NOTE — Telephone Encounter (Signed)
Patient contacted the office and states that during his last office visit with Dr. Diona Browner, she had stated that there was still tubes in his ears. He states these were placed back in 2015 by Dr. Wilburn Cornelia at [facility], and he had advised him that these would fall out on there own. He states he contacted Dr. Wilburn Cornelia about this and he wanted him to come in for an appointment, but he will need a referral from Dr. Diona Browner.  Dr. Diona Browner, would you mind putting in a referral to [facility]? Thanks!

## 2019-08-20 NOTE — Telephone Encounter (Signed)
Referral placed.

## 2019-08-30 ENCOUNTER — Telehealth: Payer: Self-pay | Admitting: Family Medicine

## 2019-08-30 ENCOUNTER — Telehealth: Payer: Self-pay | Admitting: Cardiovascular Disease

## 2019-08-30 NOTE — Telephone Encounter (Signed)
I spoke to the patient who called because he starting wearing this 30 day Event monitor on 2/1 and has noticed the adhesive causing a "rash" on his skin and was wondering if he could discontinue wearing.    I advised him to reach out to the company for better advisement on the adhesive, they may have solutions to resolve this.  He verbalized understanding and will call them.

## 2019-08-30 NOTE — Telephone Encounter (Signed)
Patient called about his referral.  Patient stated that he would like to see Dr Wilburn Cornelia in [place]

## 2019-08-30 NOTE — Telephone Encounter (Signed)
Per pt call the adhesive on the HeartMonitor is causing his skin to be raw with a rash he would like to know wether the time he had it on is enough for the study. Pt is very concerned. Please give him a call back on what to do.

## 2019-09-02 NOTE — Telephone Encounter (Signed)
Can he get the  hyperallergenic electrodes?  Reviewing for Ermalinda Barrios, PA  Cecille Rubin

## 2019-09-02 NOTE — Telephone Encounter (Signed)
Called and spoke to patient. He called [employer] and they are sending him the hypoallergenic stickers.

## 2019-09-08 ENCOUNTER — Ambulatory Visit: Payer: Medicare HMO | Attending: Internal Medicine

## 2019-09-08 DIAGNOSIS — Z23 Encounter for immunization: Secondary | ICD-10-CM

## 2019-09-08 NOTE — Progress Notes (Signed)
   Covid-19 Vaccination Clinic  Name:  [name]    MRN: [place]:[id number] DOB: 03/12/1935  09/08/2019  Mr. [name] was observed post Covid-19 immunization for 15 minutes without incidence. He was provided with Vaccine Information Sheet and instruction to access the V-Safe system.   Mr. [name] was instructed to call 911 with any severe reactions post vaccine: Marland Kitchen Difficulty breathing  . Swelling of your face and throat  . A fast heartbeat  . A bad rash all over your body  . Dizziness and weakness    Immunizations Administered    Name Date Dose VIS Date Route   Pfizer COVID-19 Vaccine 09/08/2019 11:49 AM 0.3 mL 06/21/2019 Intramuscular   Manufacturer: [place]   Lot: [id number]   [place]: [id number]

## 2019-09-09 DIAGNOSIS — H9193 Unspecified hearing loss, bilateral: Secondary | ICD-10-CM | POA: Diagnosis not present

## 2019-09-09 DIAGNOSIS — Z974 Presence of external hearing-aid: Secondary | ICD-10-CM | POA: Diagnosis not present

## 2019-09-09 DIAGNOSIS — Z9622 Myringotomy tube(s) status: Secondary | ICD-10-CM | POA: Diagnosis not present

## 2019-09-09 DIAGNOSIS — H6122 Impacted cerumen, left ear: Secondary | ICD-10-CM | POA: Diagnosis not present

## 2019-09-09 DIAGNOSIS — Z8669 Personal history of other diseases of the nervous system and sense organs: Secondary | ICD-10-CM | POA: Diagnosis not present

## 2019-09-13 ENCOUNTER — Other Ambulatory Visit: Payer: Self-pay

## 2019-09-13 ENCOUNTER — Ambulatory Visit: Payer: Medicare HMO | Admitting: Cardiovascular Disease

## 2019-09-13 ENCOUNTER — Encounter: Payer: Self-pay | Admitting: Cardiovascular Disease

## 2019-09-13 VITALS — BP 152/84 | HR 83 | Ht 66.0 in | Wt 185.8 lb

## 2019-09-13 DIAGNOSIS — I25810 Atherosclerosis of coronary artery bypass graft(s) without angina pectoris: Secondary | ICD-10-CM | POA: Diagnosis not present

## 2019-09-13 DIAGNOSIS — I441 Atrioventricular block, second degree: Secondary | ICD-10-CM

## 2019-09-13 DIAGNOSIS — I10 Essential (primary) hypertension: Secondary | ICD-10-CM

## 2019-09-13 DIAGNOSIS — E785 Hyperlipidemia, unspecified: Secondary | ICD-10-CM | POA: Diagnosis not present

## 2019-09-13 NOTE — Patient Instructions (Signed)

## 2019-09-13 NOTE — Progress Notes (Signed)
Chief Complaint  Patient presents with  . Follow-up    CAD   History of Present Illness: 85 yo male with history of CAD s/p CABG 2016, prior coronary stent placement before his CABG, HTN, hyperlipidemia, atrial fibrillation and GERD who presents today for cardiac follow up. He has been followed for CAD since 2005 when he had a LAD stent placed. He has not tolerated statins in the past secondary to myalgias. He was evaluated April 2016 for chest pain. Cardiac catheterization 10/31/14 with multivessel disease with severe 95% distal left main stenosis. He underwent 4V CABG (LIMA-LAD, SVG-DX, SVG-PDA, SVG-OM) April 2016. Echo April 2017 with normal LV function and no significant valvular disease. He was seen in July 2019 for chest pain, palpitations and uncontrolled blood pressure. Nuclear stress test July 2019 with no ischemia. Echo July 2019 with LVEF=60-65% with grade 1 diastolic dysfunction. Mild MR. Cardiac monitor July 2019 with type 2 second degree AV block and junctional bradycardia with frequent PACs. HIs beta blocker was stopped. He felt better after stopping the beta blocker. He had trouble speaking in December 2020 and EMS strips showed sinus with PACs.  EKG in ED with sinus. He is still wearing the cardiac monitor and my review of preliminary strips shows no evidence of atrial fibrillation. He is in sinus with rare PACs. Echo February 2021 with LVEF=60-65%. Mild LVH. No significant valve disease.   He is here today for follow up. The patient denies any chest pain, dyspnea, palpitations, lower extremity edema, orthopnea, PND, dizziness, near syncope or syncope. He feels great.     Primary Care Physician: Jinny Sanders, MD  Past Medical History:  Diagnosis Date  . CAD (coronary artery disease)    s/p stent mid LAD 2005 // s/p CABG 2016 // Myoview 8/19:  EF 64, no ischemia  . Coronary artery disease involving native coronary artery of native heart without angina pectoris   . Depression    . DJD (degenerative joint disease)   . DM type 2 with diabetic peripheral neuropathy ([place]) 03/10/2017  . E coli bacteremia 04/20/2016  . Essential hypertension 04/20/2016  . GERD (gastroesophageal reflux disease)   . History of echocardiogram    Echo 8/19: Moderate LVH, EF 60-65, normal wall motion, grade 1 diastolic dysfunction, mild MR, mild LAE, normal RVSF, mild TR, PASP 25  . History of hiatal hernia   . HTN (hypertension)   . Hyperlipidemia   . Neuropathy due to type 2 diabetes mellitus ([place]) 03/10/2017  . PERCUTANEOUS TRANSLUMINAL CORONARY ANGIOPLASTY, HX OF 11/26/2007   Annotation: With CYPHER STENT. Qualifier: Diagnosis of  By: [place], [place]    . Pre-diabetes   . S/P CABG x 4 11/03/2014  . S/P total knee replacement 04/11/2016  . Sepsis due to Escherichia coli (E. coli) ([place]) 04/20/2016  . Tinea pedis 09/08/2017    Past Surgical History:  Procedure Laterality Date  . BRAIN SURGERY    . CARDIAC CATHETERIZATION    . CORONARY ARTERY BYPASS GRAFT N/A 11/03/2014   Procedure: CORONARY ARTERY BYPASS GRAFTING (CABG), ON PUMP, TIMES FOUR, USING LEFT INTERNAL MAMMARY, RIGHT GREATER SAPHENOUS VEIN HARVESTED ENDOSCOPICALLY;  Surgeon: Ivin Poot, MD;  Location: [place];  Service: Open Heart Surgery;  Laterality: N/A;  LIMA-LAD; SVG-DIAG; SVG-OM; SVG-RCA  . CRANIOTOMY N/A 01/18/2014   Procedure: CRANIOTOMY HEMATOMA EVACUATION SUBDURAL;  Surgeon: Hosie Spangle, MD;  Location: [place];  Service: Neurosurgery;  Laterality: N/A;  . kidney stone removal    .  KNEE SURGERY    . LEFT HEART CATHETERIZATION WITH CORONARY ANGIOGRAM N/A 07/27/2011   Procedure: LEFT HEART CATHETERIZATION WITH CORONARY ANGIOGRAM;  Surgeon: Burnell Blanks, MD;  Location: [facility] CATH LAB;  Service: Cardiovascular;  Laterality: N/A;  . LEFT HEART CATHETERIZATION WITH CORONARY ANGIOGRAM N/A 10/31/2014   Procedure: LEFT HEART CATHETERIZATION WITH CORONARY ANGIOGRAM;  Surgeon: Sherren Mocha, MD;  Location[facility] CATH  LAB;  Service: Cardiovascular;  Laterality: N/A;  . PTCA     Hx of it.   Marland Kitchen SIGMOIDOSCOPY    . TEE WITHOUT CARDIOVERSION N/A 11/03/2014   Procedure: TRANSESOPHAGEAL ECHOCARDIOGRAM (TEE);  Surgeon: Ivin Poot, MD;  Location: [place];  Service: Open Heart Surgery;  Laterality: N/A;  . TOTAL KNEE ARTHROPLASTY  02/2011   Right knee  . TOTAL KNEE ARTHROPLASTY Left 04/11/2016   Procedure: LEFT TOTAL KNEE ARTHROPLASTY;  Surgeon: Vickey Huger, MD;  Location: [place];  Service: Orthopedics;  Laterality: Left;    Current Outpatient Medications  Medication Sig Dispense Refill  . acetaminophen (TYLENOL) 500 MG tablet Take 500-1,000 mg by mouth 2 (two) times daily as needed (for pain).     Marland Kitchen amLODipine (NORVASC) 5 MG tablet Take 1 tablet (5 mg total) by mouth daily. 90 tablet 3  . clopidogrel (PLAVIX) 75 MG tablet Take 1 tablet by mouth once daily 90 tablet 1  . isosorbide mononitrate (IMDUR) 30 MG 24 hr tablet Take 1 tablet by mouth once daily 90 tablet 1  . lisinopril (PRINIVIL,ZESTRIL) 40 MG tablet Take 1 tablet (40 mg total) by mouth daily. 90 tablet 3  . Multiple Vitamins-Minerals (ONE-A-DAY MENS HEALTH FORMULA PO) Take by mouth.    . nitroGLYCERIN (NITROSTAT) 0.4 MG SL tablet Place 0.4 mg under the tongue every 5 (five) minutes as needed for chest pain.    . pantoprazole (PROTONIX) 40 MG tablet Take 1 tablet by mouth once daily 90 tablet 1   No current facility-administered medications for this visit.    Allergies  Allergen Reactions  . Protamine Rash    Hypotension, Rash, unstable vitals.    . Statins Other (See Comments)    Caused soreness (Lipitor, Zocor)  . Adhesive [Tape] Other (See Comments)  . Rosuvastatin Calcium Other (See Comments)    Caused soreness (Lipitor, Zocor)  . Imodium [Loperamide] Itching and Rash    Social History   Socioeconomic History  . Marital status: Married    Spouse name: Not on file  . Number of children: Not on file  . Years of education: Not on file   . Highest education level: Not on file  Occupational History  . Not on file  Tobacco Use  . Smoking status: Former Research scientist (life sciences)  . Smokeless tobacco: Former Systems developer  . Tobacco comment: quit in 1985. smoked 1ppd for 35 years   Substance and Sexual Activity  . Alcohol use: No  . Drug use: No  . Sexual activity: Never  Other Topics Concern  . Not on file  Social History Narrative   Married with children. 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 10:20 am    Has living will,  HCPOA: jerrie Wisman, full code (reviewed 20)   Social Determinants of Health   Financial Resource Strain:   . Difficulty of Paying Living Expenses: Not on file  Food Insecurity:   . Worried About Charity fundraiser in the Last Year: Not on file  . Ran Out of Food in the Last Year: Not on  file  Transportation Needs:   . Film/video editor (Medical): Not on file  . Lack of Transportation (Non-Medical): Not on file  Physical Activity:   . Days of Exercise per Week: Not on file  . Minutes of Exercise per Session: Not on file  Stress:   . Feeling of Stress : Not on file  Social Connections:   . Frequency of Communication with Friends and Family: Not on file  . Frequency of Social Gatherings with Friends and Family: Not on file  . Attends Religious Services: Not on file  . Active Member of Clubs or Organizations: Not on file  . Attends Archivist Meetings: Not on file  . Marital Status: Not on file  Intimate Partner Violence:   . Fear of Current or Ex-Partner: Not on file  . Emotionally Abused: Not on file  . Physically Abused: Not on file  . Sexually Abused: Not on file    Family History  Problem Relation Age of Onset  . Heart disease Father   . Heart attack Father   . Heart failure Mother   . COPD Mother   . Heart disease Sister   . Heart attack Sister   . Heart attack Brother     Review of Systems:  As stated in the HPI and otherwise negative.    BP (!) 152/84   Pulse 83   Ht 5\' 6"  (1.676 m)   Wt 185 lb 12.8 oz (84.3 kg)   SpO2 97%   BMI 29.99 kg/m   Physical Examination:  General: Well developed, well nourished, NAD  HEENT: OP clear, mucus membranes moist  SKIN: warm, dry. No rashes. Neuro: No focal deficits  Musculoskeletal: Muscle strength 5/5 all ext  Psychiatric: Mood and affect normal  Neck: No JVD, no carotid bruits, no thyromegaly, no lymphadenopathy.  Lungs:Clear bilaterally, no wheezes, rhonci, crackles Cardiovascular: Regular rate and rhythm. No murmurs, gallops or rubs. Abdomen:Soft. Bowel sounds present. Non-tender.  Extremities: No lower extremity edema. Pulses are 2 + in the bilateral DP/PT.  Echo 02/07/18 Moderate LVH, EF 60-65, normal wall motion, grade 1 diastolic dysfunction, trivial AI, mild MR, mild LAE, mild TR, PASP 25  48 Hr Holter 02/07/18 Type 2 second degree AV block Junctional bradycardia.  Frequent Premature atrial contractions (3898 during monitoring period) Rare premature ventricular contractions   Nuclear stress test 01/10/18  Nuclear stress EF: 64% with septal wall hypokinesis  There was no ST segment deviation noted during stress.  The study is normal.  This is a low risk study. No ischemia identified.  EKG:  EKG is not ordered today. The ekg ordered today demonstrates   Recent Labs: 03/13/2019: ALT 12; BUN 19; Creatinine, Ser 0.84; Potassium 3.6; Sodium 142   Lipid Panel    Component Value Date/Time   CHOL 113 03/13/2019 0902   CHOL 122 09/22/2016 0912   TRIG 70.0 03/13/2019 0902   HDL 38.90 (L) 03/13/2019 0902   HDL 39 (L) 09/22/2016 0912   CHOLHDL 3 03/13/2019 0902   VLDL 14.0 03/13/2019 0902   LDLCALC 60 03/13/2019 0902   LDLCALC 63 09/22/2016 0912     Wt Readings from Last 3 Encounters:  09/13/19 185 lb 12.8 oz (84.3 kg)  08/07/19 184 lb (83.5 kg)  03/22/19 178 lb 4 oz (80.9 kg)     Other studies Reviewed: Additional studies/ records that were reviewed  today include: . Review of the above records demonstrates:    Assessment and Plan:   1. CAD  without angina: He is s/p 4V CABG in 2016. Nuclear stress test July 2019 with no ischemia. He has no chest pain. Will continue Plavix, Imdur and Crestor.   2. HTN: BP is well controlled. No changes today.   3. Hyperlipidemia: LDL at goal in September 2020. Continue statin.      4. Bradycardia/Second Degree AV block: No recent dizziness.   5. Snoring/Daytime Somnolence: He does not wish to arrange a sleep study.  Current medicines are reviewed at length with the patient today.  The patient does not have concerns regarding medicines.  The following changes have been made:  no change  Labs/ tests ordered today include:   No orders of the defined types were placed in this encounter.   Disposition:   FU with me in 12  months  Signed, Lauree Chandler, MD 09/13/2019 2:17 PM    [facility] [place], [place], [place]  [address] Phone: [phone]; Fax: [phone]

## 2019-09-17 ENCOUNTER — Encounter: Payer: Self-pay | Admitting: Family Medicine

## 2019-09-17 ENCOUNTER — Other Ambulatory Visit: Payer: Self-pay

## 2019-09-17 ENCOUNTER — Ambulatory Visit (INDEPENDENT_AMBULATORY_CARE_PROVIDER_SITE_OTHER): Payer: Medicare HMO | Admitting: Family Medicine

## 2019-09-17 VITALS — BP 130/78 | HR 85 | Temp 97.6°F | Ht 66.0 in | Wt 182.3 lb

## 2019-09-17 DIAGNOSIS — E1142 Type 2 diabetes mellitus with diabetic polyneuropathy: Secondary | ICD-10-CM

## 2019-09-17 DIAGNOSIS — I10 Essential (primary) hypertension: Secondary | ICD-10-CM | POA: Diagnosis not present

## 2019-09-17 DIAGNOSIS — E1140 Type 2 diabetes mellitus with diabetic neuropathy, unspecified: Secondary | ICD-10-CM

## 2019-09-17 DIAGNOSIS — E785 Hyperlipidemia, unspecified: Secondary | ICD-10-CM

## 2019-09-17 LAB — POCT GLYCOSYLATED HEMOGLOBIN (HGB A1C): Hemoglobin A1C: 6.4 % — AB (ref 4.0–5.6)

## 2019-09-17 MED ORDER — ROSUVASTATIN CALCIUM 5 MG PO TABS: 5.0000 mg | ORAL_TABLET | Freq: Every day | ORAL | 3 refills | Status: DC

## 2019-09-17 MED ORDER — NITROGLYCERIN 0.4 MG SL SUBL: 0.4000 mg | SUBLINGUAL_TABLET | SUBLINGUAL | 0 refills | Status: DC | PRN

## 2019-09-17 NOTE — Assessment & Plan Note (Signed)
Stable , nonpainful

## 2019-09-17 NOTE — Assessment & Plan Note (Signed)
Does not think felt any better off crestor.. restart.  Previously at goal.

## 2019-09-17 NOTE — Progress Notes (Signed)
Chief Complaint  Patient presents with  . Diabetes    History of Present Illness: HPI   85 year old male presents for follow up D  Diabetes:   Good control on no med Lab Results  Component Value Date   HGBA1C 6.4 (A) 09/17/2019  Using medications without difficulties: Hypoglycemic episodes:none Hyperglycemic episodes: none Feet problems: no ulcers Blood Sugars averaging:  84-140 eye exam within last year: uptodate  Stable chest pain, no SOB.  Reviewed OV 09/13/2019 Dr. Glennie Hawk  Excellent blood pressure control on lisinopril and amlodipine. BP Readings from Last 3 Encounters:  09/17/19 130/78  09/13/19 (!) 152/84  08/07/19 (!) 154/88     Off crestor in last 6 months given thought it gave him SE.   This visit occurred during the SARS-CoV-2 public health emergency.  Safety protocols were in place, including screening questions prior to the visit, additional usage of staff PPE, and extensive cleaning of exam room while observing appropriate contact time as indicated for disinfecting solutions.   COVID 19 screen:  No recent travel or known exposure to COVID19 The patient denies respiratory symptoms of COVID 19 at this time. The importance of social distancing was discussed today.     Review of Systems  Constitutional: Negative for chills and fever.  HENT: Negative for congestion and ear pain.   Eyes: Negative for pain and redness.  Respiratory: Negative for cough and shortness of breath.   Cardiovascular: Negative for chest pain, palpitations and leg swelling.  Gastrointestinal: Negative for abdominal pain, blood in stool, constipation, diarrhea, nausea and vomiting.  Genitourinary: Negative for dysuria.  Musculoskeletal: Negative for falls and myalgias.  Skin: Negative for rash.  Neurological: Negative for dizziness.  Psychiatric/Behavioral: Negative for depression. The patient is not nervous/anxious.       Past Medical History:  Diagnosis Date  . CAD (coronary  artery disease)    s/p stent mid LAD 2005 // s/p CABG 2016 // Myoview 8/19:  EF 64, no ischemia  . Coronary artery disease involving native coronary artery of native heart without angina pectoris   . Depression   . DJD (degenerative joint disease)   . DM type 2 with diabetic peripheral neuropathy ([place]) 03/10/2017  . E coli bacteremia 04/20/2016  . Essential hypertension 04/20/2016  . GERD (gastroesophageal reflux disease)   . History of echocardiogram    Echo 8/19: Moderate LVH, EF 60-65, normal wall motion, grade 1 diastolic dysfunction, mild MR, mild LAE, normal RVSF, mild TR, PASP 25  . History of hiatal hernia   . HTN (hypertension)   . Hyperlipidemia   . Neuropathy due to type 2 diabetes mellitus ([place]) 03/10/2017  . PERCUTANEOUS TRANSLUMINAL CORONARY ANGIOPLASTY, HX OF 11/26/2007   Annotation: With CYPHER STENT. Qualifier: Diagnosis of  By: [place], [place]    . Pre-diabetes   . S/P CABG x 4 11/03/2014  . S/P total knee replacement 04/11/2016  . Sepsis due to Escherichia coli (E. coli) ([place]) 04/20/2016  . Tinea pedis 09/08/2017    reports that he has quit smoking. He has quit using smokeless tobacco. He reports that he does not drink alcohol or use drugs.   Current Outpatient Medications:  .  acetaminophen (TYLENOL) 500 MG tablet, Take 500-1,000 mg by mouth 2 (two) times daily as needed (for pain). , Disp: , Rfl:  .  amLODipine (NORVASC) 5 MG tablet, Take 1 tablet (5 mg total) by mouth daily., Disp: 90 tablet, Rfl: 3 .  clopidogrel (PLAVIX) 75 MG tablet, Take  1 tablet by mouth once daily, Disp: 90 tablet, Rfl: 1 .  isosorbide mononitrate (IMDUR) 30 MG 24 hr tablet, Take 1 tablet by mouth once daily, Disp: 90 tablet, Rfl: 1 .  lisinopril (PRINIVIL,ZESTRIL) 40 MG tablet, Take 1 tablet (40 mg total) by mouth daily., Disp: 90 tablet, Rfl: 3 .  Multiple Vitamins-Minerals (ONE-A-DAY MENS HEALTH FORMULA PO), Take by mouth., Disp: , Rfl:  .  nitroGLYCERIN (NITROSTAT) 0.4 MG SL tablet, Place  0.4 mg under the tongue every 5 (five) minutes as needed for chest pain., Disp: , Rfl:  .  pantoprazole (PROTONIX) 40 MG tablet, Take 1 tablet by mouth once daily, Disp: 90 tablet, Rfl: 1   Observations/Objective: Blood pressure 130/78, pulse 85, temperature 97.6 F (36.4 C), temperature source Temporal, height 5\' 6"  (1.676 m), weight 182 lb 5 oz (82.7 kg), SpO2 97 %.  Physical Exam Constitutional:      Appearance: He is well-developed.  HENT:     Head: Normocephalic.     Right Ear: Hearing normal.     Left Ear: Hearing normal.     Nose: Nose normal.  Neck:     Thyroid: No thyroid mass or thyromegaly.     Vascular: No carotid bruit.     Trachea: Trachea normal.  Cardiovascular:     Rate and Rhythm: Normal rate and regular rhythm.     Pulses: Normal pulses.     Heart sounds: Heart sounds not distant. No murmur. No friction rub. No gallop.      Comments: No peripheral edema Pulmonary:     Effort: Pulmonary effort is normal. No respiratory distress.     Breath sounds: Normal breath sounds.  Skin:    General: Skin is warm and dry.     Findings: No rash.  Psychiatric:        Speech: Speech normal.        Behavior: Behavior normal.        Thought Content: Thought content normal.      Diabetic foot exam: Normal inspection No skin breakdown No calluses  Normal DP pulses Decreased sensation to light touch and monofilament in toes Nails normal  Assessment and Plan   DM type 2 with diabetic peripheral neuropathy (HCC)  Good control with diet. Encouraged exercise, weight loss, healthy eating habits.   Essential hypertension Well controlled. Continue current medication.   Neuropathy due to type 2 diabetes mellitus (HCC)  Stable , nonpainful  Hyperlipidemia Does not think felt any better off crestor.. restart.  Previously at goal.     Eliezer Lofts, MD

## 2019-09-17 NOTE — Patient Instructions (Addendum)
Work on low carbohydrate diet.  Work on regular exercise.  Restart crestor.. call if issues.

## 2019-09-17 NOTE — Assessment & Plan Note (Signed)
Good control with diet. Encouraged exercise, weight loss, healthy eating habits.

## 2019-09-17 NOTE — Assessment & Plan Note (Signed)
Well controlled. Continue current medication.  

## 2019-09-26 ENCOUNTER — Other Ambulatory Visit: Payer: Self-pay | Admitting: Cardiovascular Disease

## 2019-10-08 ENCOUNTER — Ambulatory Visit: Payer: Medicare HMO | Attending: Internal Medicine

## 2019-10-08 DIAGNOSIS — Z23 Encounter for immunization: Secondary | ICD-10-CM

## 2019-10-08 NOTE — Progress Notes (Signed)
   Covid-19 Vaccination Clinic  Name:  [name]    MRN: [place]:[id number] DOB: 1934/10/07  10/08/2019  [name] was observed post Covid-19 immunization for 15 minutes without incident. He was provided with Vaccine Information Sheet and instruction to access the V-Safe system.   [name] was instructed to call 911 with any severe reactions post vaccine: Marland Kitchen Difficulty breathing  . Swelling of face and throat  . A fast heartbeat  . A bad rash all over body  . Dizziness and weakness   Immunizations Administered    Name Date Dose VIS Date Route   Pfizer COVID-19 Vaccine 10/08/2019  3:56 PM 0.3 mL 06/21/2019 Intramuscular   Manufacturer: [place]   Lot: [id number]   [place]: [id number]

## 2019-11-12 ENCOUNTER — Other Ambulatory Visit: Payer: Self-pay | Admitting: Cardiovascular Disease

## 2019-11-15 DIAGNOSIS — D2261 Melanocytic nevi of right upper limb, including shoulder: Secondary | ICD-10-CM | POA: Diagnosis not present

## 2019-11-15 DIAGNOSIS — C44619 Basal cell carcinoma of skin of left upper limb, including shoulder: Secondary | ICD-10-CM | POA: Diagnosis not present

## 2019-11-15 DIAGNOSIS — D225 Melanocytic nevi of trunk: Secondary | ICD-10-CM | POA: Diagnosis not present

## 2019-11-15 DIAGNOSIS — X32XXXA Exposure to sunlight, initial encounter: Secondary | ICD-10-CM | POA: Diagnosis not present

## 2019-11-15 DIAGNOSIS — D2271 Melanocytic nevi of right lower limb, including hip: Secondary | ICD-10-CM | POA: Diagnosis not present

## 2019-11-15 DIAGNOSIS — L570 Actinic keratosis: Secondary | ICD-10-CM | POA: Diagnosis not present

## 2019-11-15 DIAGNOSIS — Z85828 Personal history of other malignant neoplasm of skin: Secondary | ICD-10-CM | POA: Diagnosis not present

## 2019-11-15 DIAGNOSIS — D485 Neoplasm of uncertain behavior of skin: Secondary | ICD-10-CM | POA: Diagnosis not present

## 2019-11-15 DIAGNOSIS — D2262 Melanocytic nevi of left upper limb, including shoulder: Secondary | ICD-10-CM | POA: Diagnosis not present

## 2019-12-11 DIAGNOSIS — C44619 Basal cell carcinoma of skin of left upper limb, including shoulder: Secondary | ICD-10-CM | POA: Diagnosis not present

## 2019-12-27 ENCOUNTER — Other Ambulatory Visit: Payer: Self-pay | Admitting: Family Medicine

## 2020-03-12 DIAGNOSIS — H524 Presbyopia: Secondary | ICD-10-CM | POA: Diagnosis not present

## 2020-03-12 DIAGNOSIS — H52203 Unspecified astigmatism, bilateral: Secondary | ICD-10-CM | POA: Diagnosis not present

## 2020-04-21 DIAGNOSIS — C44519 Basal cell carcinoma of skin of other part of trunk: Secondary | ICD-10-CM | POA: Diagnosis not present

## 2020-04-21 DIAGNOSIS — D2261 Melanocytic nevi of right upper limb, including shoulder: Secondary | ICD-10-CM | POA: Diagnosis not present

## 2020-04-21 DIAGNOSIS — D2262 Melanocytic nevi of left upper limb, including shoulder: Secondary | ICD-10-CM | POA: Diagnosis not present

## 2020-04-21 DIAGNOSIS — D044 Carcinoma in situ of skin of scalp and neck: Secondary | ICD-10-CM | POA: Diagnosis not present

## 2020-04-21 DIAGNOSIS — L570 Actinic keratosis: Secondary | ICD-10-CM | POA: Diagnosis not present

## 2020-04-21 DIAGNOSIS — Z85828 Personal history of other malignant neoplasm of skin: Secondary | ICD-10-CM | POA: Diagnosis not present

## 2020-04-21 DIAGNOSIS — X32XXXA Exposure to sunlight, initial encounter: Secondary | ICD-10-CM | POA: Diagnosis not present

## 2020-04-21 DIAGNOSIS — D225 Melanocytic nevi of trunk: Secondary | ICD-10-CM | POA: Diagnosis not present

## 2020-04-21 DIAGNOSIS — D485 Neoplasm of uncertain behavior of skin: Secondary | ICD-10-CM | POA: Diagnosis not present

## 2020-04-21 DIAGNOSIS — D2271 Melanocytic nevi of right lower limb, including hip: Secondary | ICD-10-CM | POA: Diagnosis not present

## 2020-05-26 DIAGNOSIS — C44519 Basal cell carcinoma of skin of other part of trunk: Secondary | ICD-10-CM | POA: Diagnosis not present

## 2020-06-08 DIAGNOSIS — D044 Carcinoma in situ of skin of scalp and neck: Secondary | ICD-10-CM | POA: Diagnosis not present

## 2020-06-08 DIAGNOSIS — L570 Actinic keratosis: Secondary | ICD-10-CM | POA: Diagnosis not present

## 2020-06-12 ENCOUNTER — Telehealth: Payer: Self-pay | Admitting: Family Medicine

## 2020-06-12 DIAGNOSIS — E1142 Type 2 diabetes mellitus with diabetic polyneuropathy: Secondary | ICD-10-CM

## 2020-06-12 NOTE — Telephone Encounter (Signed)
-----   Message from Cloyd Stagers, RT sent at 06/02/2020  7:53 AM EST ----- Regarding: Lab Orders for Monday 12.6.2021 Please place lab orders for Monday 12.6.2021, office visit for physical on Tuesday 12.14.2021 Thank you, Dyke Maes RT(R)

## 2020-06-15 ENCOUNTER — Other Ambulatory Visit (INDEPENDENT_AMBULATORY_CARE_PROVIDER_SITE_OTHER): Payer: Medicare HMO

## 2020-06-15 ENCOUNTER — Other Ambulatory Visit: Payer: Self-pay

## 2020-06-15 DIAGNOSIS — E1142 Type 2 diabetes mellitus with diabetic polyneuropathy: Secondary | ICD-10-CM

## 2020-06-15 LAB — COMPREHENSIVE METABOLIC PANEL
ALT: 14 U/L (ref 0–53)
AST: 17 U/L (ref 0–37)
Albumin: 4.4 g/dL (ref 3.5–5.2)
Alkaline Phosphatase: 44 U/L (ref 39–117)
BUN: 15 mg/dL (ref 6–23)
CO2: 29 mEq/L (ref 19–32)
Calcium: 9.5 mg/dL (ref 8.4–10.5)
Chloride: 104 mEq/L (ref 96–112)
Creatinine, Ser: 0.86 mg/dL (ref 0.40–1.50)
GFR: 80.09 mL/min (ref >60.00)
Glucose, Bld: 124 mg/dL — ABNORMAL HIGH (ref 70–99)
Potassium: 3.9 mEq/L (ref 3.5–5.1)
Sodium: 142 mEq/L (ref 135–145)
Total Bilirubin: 0.6 mg/dL (ref 0.2–1.2)
Total Protein: 7.1 g/dL (ref 6.0–8.3)

## 2020-06-15 LAB — HEMOGLOBIN A1C: Hgb A1c MFr Bld: 6.7 % — ABNORMAL HIGH (ref 4.6–6.5)

## 2020-06-15 LAB — LIPID PANEL
Cholesterol: 132 mg/dL (ref 0–200)
HDL: 42.6 mg/dL (ref >39.00)
LDL Cholesterol: 69 mg/dL (ref 0–99)
NonHDL: 89.19
Total CHOL/HDL Ratio: 3
Triglycerides: 101 mg/dL (ref 0.0–149.0)
VLDL: 20.2 mg/dL (ref 0.0–40.0)

## 2020-06-16 NOTE — Progress Notes (Signed)
No critical labs need to be addressed urgently. We will discuss labs in detail at upcoming office visit.   

## 2020-06-17 ENCOUNTER — Ambulatory Visit (INDEPENDENT_AMBULATORY_CARE_PROVIDER_SITE_OTHER): Payer: Medicare HMO

## 2020-06-17 ENCOUNTER — Other Ambulatory Visit: Payer: Self-pay

## 2020-06-17 DIAGNOSIS — Z0000 Encounter for general adult medical examination without abnormal findings: Secondary | ICD-10-CM | POA: Diagnosis not present

## 2020-06-17 NOTE — Patient Instructions (Signed)
[name] , Thank you for taking time to come for your Medicare Wellness Visit. I appreciate your ongoing commitment to your health goals. Please review the following plan we discussed and let me know if I can assist you in the future.   Screening recommendations/referrals: Colonoscopy: no longer required  Recommended yearly ophthalmology/optometry visit for glaucoma screening and checkup Recommended yearly dental visit for hygiene and checkup  Vaccinations: Influenza vaccine: declined Pneumococcal vaccine: Completed series Tdap vaccine: decline-insurance Shingles vaccine: due, check with your insurance regarding coverage/cost if interested    Covid-19: Completed series  Advanced directives: Please bring a copy of your POA (Power of Attorney) and/or Living Will to your next appointment.   Conditions/risks identified: hypertension, hyperlipidemia, Diabetes  Next appointment: Follow up in one year for your annual wellness visit.   Preventive Care 83 Years and Older, Male Preventive care refers to lifestyle choices and visits with your health care provider that can promote health and wellness. What does preventive care include?  A yearly physical exam. This is also called an annual well check.  Dental exams once or twice a year.  Routine eye exams. Ask your health care provider how often you should have your eyes checked.  Personal lifestyle choices, including:  Daily care of your teeth and gums.  Regular physical activity.  Eating a healthy diet.  Avoiding tobacco and drug use.  Limiting alcohol use.  Practicing safe sex.  Taking low doses of aspirin every day.  Taking vitamin and mineral supplements as recommended by your health care provider. What happens during an annual well check? The services and screenings done by your health care provider during your annual well check will depend on your age, overall health, lifestyle risk factors, and family history of  disease. Counseling  Your health care provider may ask you questions about your:  Alcohol use.  Tobacco use.  Drug use.  Emotional well-being.  Home and relationship well-being.  Sexual activity.  Eating habits.  History of falls.  Memory and ability to understand (cognition).  Work and work Statistician. Screening  You may have the following tests or measurements:  Height, weight, and BMI.  Blood pressure.  Lipid and cholesterol levels. These may be checked every 5 years, or more frequently if you are over 83 years old.  Skin check.  Lung cancer screening. You may have this screening every year starting at age 83 if you have a 30-pack-year history of smoking and currently smoke or have quit within the past 15 years.  Fecal occult blood test (FOBT) of the stool. You may have this test every year starting at age 83.  Flexible sigmoidoscopy or colonoscopy. You may have a sigmoidoscopy every 5 years or a colonoscopy every 10 years starting at age 83.  Prostate cancer screening. Recommendations will vary depending on your family history and other risks.  Hepatitis C blood test.  Hepatitis B blood test.  Sexually transmitted disease (STD) testing.  Diabetes screening. This is done by checking your blood sugar (glucose) after you have not eaten for a while (fasting). You may have this done every 1-3 years.  Abdominal aortic aneurysm (AAA) screening. You may need this if you are a current or former smoker.  Osteoporosis. You may be screened starting at age 3 if you are at high risk. Talk with your health care provider about your test results, treatment options, and if necessary, the need for more tests. Vaccines  Your health care provider may recommend certain vaccines, such  as:  Influenza vaccine. This is recommended every year.  Tetanus, diphtheria, and acellular pertussis (Tdap, Td) vaccine. You may need a Td booster every 10 years.  Zoster vaccine. You may  need this after age 21.  Pneumococcal 13-valent conjugate (PCV13) vaccine. One dose is recommended after age 50.  Pneumococcal polysaccharide (PPSV23) vaccine. One dose is recommended after age 45. Talk to your health care provider about which screenings and vaccines you need and how often you need them. This information is not intended to replace advice given to you by your health care provider. Make sure you discuss any questions you have with your health care provider. Document Released: 07/24/2015 Document Revised: 03/16/2016 Document Reviewed: 04/28/2015 Elsevier Interactive Patient Education  2017 [place] Prevention in the Home Falls can cause injuries. They can happen to people of all ages. There are many things you can do to make your home safe and to help prevent falls. What can I do on the outside of my home?  Regularly fix the edges of walkways and driveways and fix any cracks.  Remove anything that might make you trip as you walk through a door, such as a raised step or threshold.  Trim any bushes or trees on the path to your home.  Use bright outdoor lighting.  Clear any walking paths of anything that might make someone trip, such as rocks or tools.  Regularly check to see if handrails are loose or broken. Make sure that both sides of any steps have handrails.  Any raised decks and porches should have guardrails on the edges.  Have any leaves, snow, or ice cleared regularly.  Use sand or salt on walking paths during winter.  Clean up any spills in your garage right away. This includes oil or grease spills. What can I do in the bathroom?  Use night lights.  Install grab bars by the toilet and in the tub and shower. Do not use towel bars as grab bars.  Use non-skid mats or decals in the tub or shower.  If you need to sit down in the shower, use a plastic, non-slip stool.  Keep the floor dry. Clean up any water that spills on the floor as soon as it  happens.  Remove soap buildup in the tub or shower regularly.  Attach bath mats securely with double-sided non-slip rug tape.  Do not have throw rugs and other things on the floor that can make you trip. What can I do in the bedroom?  Use night lights.  Make sure that you have a light by your bed that is easy to reach.  Do not use any sheets or blankets that are too big for your bed. They should not hang down onto the floor.  Have a firm chair that has side arms. You can use this for support while you get dressed.  Do not have throw rugs and other things on the floor that can make you trip. What can I do in the kitchen?  Clean up any spills right away.  Avoid walking on wet floors.  Keep items that you use a lot in easy-to-reach places.  If you need to reach something above you, use a strong step stool that has a grab bar.  Keep electrical cords out of the way.  Do not use floor polish or wax that makes floors slippery. If you must use wax, use non-skid floor wax.  Do not have throw rugs and other things on the  floor that can make you trip. What can I do with my stairs?  Do not leave any items on the stairs.  Make sure that there are handrails on both sides of the stairs and use them. Fix handrails that are broken or loose. Make sure that handrails are as long as the stairways.  Check any carpeting to make sure that it is firmly attached to the stairs. Fix any carpet that is loose or worn.  Avoid having throw rugs at the top or bottom of the stairs. If you do have throw rugs, attach them to the floor with carpet tape.  Make sure that you have a light switch at the top of the stairs and the bottom of the stairs. If you do not have them, ask someone to add them for you. What else can I do to help prevent falls?  Wear shoes that:  Do not have high heels.  Have rubber bottoms.  Are comfortable and fit you well.  Are closed at the toe. Do not wear sandals.  If you  use a stepladder:  Make sure that it is fully opened. Do not climb a closed stepladder.  Make sure that both sides of the stepladder are locked into place.  Ask someone to hold it for you, if possible.  Clearly mark and make sure that you can see:  Any grab bars or handrails.  First and last steps.  Where the edge of each step is.  Use tools that help you move around (mobility aids) if they are needed. These include:  Canes.  Walkers.  Scooters.  Crutches.  Turn on the lights when you go into a dark area. Replace any light bulbs as soon as they burn out.  Set up your furniture so you have a clear path. Avoid moving your furniture around.  If any of your floors are uneven, fix them.  If there are any pets around you, be aware of where they are.  Review your medicines with your doctor. Some medicines can make you feel dizzy. This can increase your chance of falling. Ask your doctor what other things that you can do to help prevent falls. This information is not intended to replace advice given to you by your health care provider. Make sure you discuss any questions you have with your health care provider. Document Released: 04/23/2009 Document Revised: 12/03/2015 Document Reviewed: 08/01/2014 Elsevier Interactive Patient Education  2017 [employer].

## 2020-06-17 NOTE — Progress Notes (Signed)
Subjective:   [name] is a 85 y.o. male who presents for Medicare Annual/Subsequent preventive examination.  Review of Systems: N/A      I connected with the patient today by telephone and verified that I am speaking with the correct person using two identifiers. Location patient: home Location nurse: work Persons participating in the telephone visit: patient, nurse.   I discussed the limitations, risks, security and privacy concerns of performing an evaluation and management service by telephone and the availability of in person appointments. I also discussed with the patient that there may be a patient responsible charge related to this service. The patient expressed understanding and verbally consented to this telephonic visit.        Cardiac Risk Factors include: advanced age (>56men, >74 women);hypertension;male gender;diabetes mellitus;Other (see comment), Risk factor comments: hyperlipidemia     Objective:    Today's Vitals   There is no height or weight on file to calculate BMI.  Advanced Directives 06/17/2020 03/07/2018 03/03/2017 04/20/2016 04/19/2016 03/31/2016 01/21/2016  Does Patient Have a Medical Advance Directive? Yes Yes Yes Yes Yes Yes Yes  Type of Paramedic of [place];Living will [place];Living will Living will Living will;Healthcare Power of Attorney Living will;Healthcare Power of Attorney Living will;Healthcare Power of [place];Living will  Does patient want to make changes to medical advance directive? - - - No - Patient declined - No - Patient declined No - Patient declined  Copy of [place] in Chart? No - copy requested No - copy requested - No - copy requested - No - copy requested No - copy requested  Pre-existing out of facility DNR order (yellow form or pink MOST form) - - - - - - -    Current Medications (verified) Outpatient Encounter Medications as of  06/17/2020  Medication Sig  . acetaminophen (TYLENOL) 500 MG tablet Take 500-1,000 mg by mouth 2 (two) times daily as needed (for pain).   Marland Kitchen amLODipine (NORVASC) 5 MG tablet Take 1 tablet (5 mg total) by mouth daily.  . clopidogrel (PLAVIX) 75 MG tablet Take 1 tablet by mouth once daily  . isosorbide mononitrate (IMDUR) 30 MG 24 hr tablet Take 1 tablet by mouth once daily  . lisinopril (ZESTRIL) 40 MG tablet Take 1 tablet by mouth once daily  . Multiple Vitamins-Minerals (ONE-A-DAY MENS HEALTH FORMULA PO) Take by mouth.  . nitroGLYCERIN (NITROSTAT) 0.4 MG SL tablet Place 1 tablet (0.4 mg total) under the tongue every 5 (five) minutes as needed for chest pain.  . pantoprazole (PROTONIX) 40 MG tablet Take 1 tablet by mouth once daily  . rosuvastatin (CRESTOR) 5 MG tablet Take 1 tablet (5 mg total) by mouth daily.   No facility-administered encounter medications on file as of 06/17/2020.    Allergies (verified) Protamine, Statins, Adhesive [tape], Rosuvastatin calcium, and Imodium [loperamide]   History: Past Medical History:  Diagnosis Date  . CAD (coronary artery disease)    s/p stent mid LAD 2005 // s/p CABG 2016 // Myoview 8/19:  EF 64, no ischemia  . Coronary artery disease involving native coronary artery of native heart without angina pectoris   . Depression   . DJD (degenerative joint disease)   . DM type 2 with diabetic peripheral neuropathy ([place]) 03/10/2017  . E coli bacteremia 04/20/2016  . Essential hypertension 04/20/2016  . GERD (gastroesophageal reflux disease)   . History of echocardiogram    Echo 8/19: Moderate  LVH, EF 60-65, normal wall motion, grade 1 diastolic dysfunction, mild MR, mild LAE, normal RVSF, mild TR, PASP 25  . History of hiatal hernia   . HTN (hypertension)   . Hyperlipidemia   . Neuropathy due to type 2 diabetes mellitus ([place]) 03/10/2017  . PERCUTANEOUS TRANSLUMINAL CORONARY ANGIOPLASTY, HX OF 11/26/2007   Annotation: With CYPHER STENT. Qualifier:  Diagnosis of  By: [place], [place]    . Pre-diabetes   . S/P CABG x 4 11/03/2014  . S/P total knee replacement 04/11/2016  . Sepsis due to Escherichia coli (E. coli) ([place]) 04/20/2016  . Tinea pedis 09/08/2017   Past Surgical History:  Procedure Laterality Date  . BRAIN SURGERY    . CARDIAC CATHETERIZATION    . CORONARY ARTERY BYPASS GRAFT N/A 11/03/2014   Procedure: CORONARY ARTERY BYPASS GRAFTING (CABG), ON PUMP, TIMES FOUR, USING LEFT INTERNAL MAMMARY, RIGHT GREATER SAPHENOUS VEIN HARVESTED ENDOSCOPICALLY;  Surgeon: Ivin Poot, MD;  Location: [facility];  Service: Open Heart Surgery;  Laterality: N/A;  LIMA-LAD; SVG-DIAG; SVG-OM; SVG-RCA  . CRANIOTOMY N/A 01/18/2014   Procedure: CRANIOTOMY HEMATOMA EVACUATION SUBDURAL;  Surgeon: Hosie Spangle, MD;  Location: [facility];  Service: Neurosurgery;  Laterality: N/A;  . kidney stone removal    . KNEE SURGERY    . LEFT HEART CATHETERIZATION WITH CORONARY ANGIOGRAM N/A 07/27/2011   Procedure: LEFT HEART CATHETERIZATION WITH CORONARY ANGIOGRAM;  Surgeon: Burnell Blanks, MD;  Location[facility] C[facility] LAB;  Service: Cardiovascular;  Laterality: N/A;  . LEFT HEART CATHETERIZATION WITH CORONARY ANGIOGRAM N/A 10/31/2014   Procedure: LEFT HEART CATHETERIZATION WITH CORONARY ANGIOGRAM;  Surgeon: Sherren Mocha, MD;  Location: [facility] CATH LAB;  Service: Cardiovascular;  Laterality: N/A;  . PTCA     Hx of it.   Marland Kitchen SIGMOIDOSCOPY    . TEE WITHOUT CARDIOVERSION N/A 11/03/2014   Procedure: TRANSESOPHAGEAL ECHOCARDIOGRAM (TEE);  Surgeon: Ivin Poot, MD;  Location: [facility];  Service: Open Heart Surgery;  Laterality: N/A;  . TOTAL KNEE ARTHROPLASTY  02/2011   Right knee  . TOTAL KNEE ARTHROPLASTY Left 04/11/2016   Procedure: LEFT TOTAL KNEE ARTHROPLASTY;  Surgeon: Vickey Huger, MD;  Location: [facility];  Service: Orthopedics;  Laterality: Left;   Family History  Problem Relation Age of Onset  . Heart disease Father   . Heart attack Father   . Heart failure Mother   .  COPD Mother   . Heart disease Sister   . Heart attack Sister   . Heart attack Brother    Social History   Socioeconomic History  . Marital status: Married    Spouse name: Not on file  . Number of children: Not on file  . Years of education: Not on file  . Highest education level: Not on file  Occupational History  . Not on file  Tobacco Use  . Smoking status: Former Research scientist (life sciences)  . Smokeless tobacco: Former Systems developer  . Tobacco comment: quit in 1985. smoked 1ppd for 35 years   Vaping Use  . Vaping Use: Never used  Substance and Sexual Activity  . Alcohol use: No  . Drug use: No  . Sexual activity: Never  Other Topics Concern  . Not on file  Social History Narrative   Married with children. 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 10:20 am    Has living will,  HCPOA: jerrie Detlefsen, full code (reviewed 65)   Social Determinants of Health   Financial Resource Strain: Low  Risk   . Difficulty of Paying Living Expenses: Not hard at all  Food Insecurity: No Food Insecurity  . Worried About Charity fundraiser in the Last Year: Never true  . Ran Out of Food in the Last Year: Never true  Transportation Needs: No Transportation Needs  . Lack of Transportation (Medical): No  . Lack of Transportation (Non-Medical): No  Physical Activity: Insufficiently Active  . Days of Exercise per Week: 7 days  . Minutes of Exercise per Session: 10 min  Stress: No Stress Concern Present  . Feeling of Stress : Not at all  Social Connections:   . Frequency of Communication with Friends and Family: Not on file  . Frequency of Social Gatherings with Friends and Family: Not on file  . Attends Religious Services: Not on file  . Active Member of Clubs or Organizations: Not on file  . Attends Archivist Meetings: Not on file  . Marital Status: Not on file    Tobacco Counseling Counseling given: Not Answered Comment: quit in 1985. smoked 1ppd for 35 years     Clinical Intake:  Pre-visit preparation completed: Yes  Pain : No/denies pain     Nutritional Risks: None Diabetes: Yes CBG done?: No Did pt. bring in CBG monitor from home?: No  How often do you need to have someone help you when you read instructions, pamphlets, or other written materials from your doctor or pharmacy?: 1 - Never What is the last grade level you completed in school?: 12th  Diabetic: Yes Nutrition Risk Assessment:  Has the patient had any N/V/D within the last 2 months?  No  Does the patient have any non-healing wounds?  No  Has the patient had any unintentional weight loss or weight gain?  No   Diabetes:  Is the patient diabetic?  Yes  If diabetic, was a CBG obtained today?  No telephone visit Did the patient bring in their glucometer from home?  No telephone visit  How often do you monitor your CBG's? Once a month.   Financial Strains and Diabetes Management:  Are you having any financial strains with the device, your supplies or your medication? No .  Does the patient want to be seen by Chronic Care Management for management of their diabetes?  No  Would the patient like to be referred to a Nutritionist or for Diabetic Management?  No   Diabetic eye exam: completed 03/11/2020 Diabetic Foot exam: due, scheduled for 06/23/2020 Interpreter Needed?: No  Information entered by :: CJohnson, LPN   Activities of Daily Living In your present state of health, do you have any difficulty performing the following activities: 06/17/2020  Hearing? Y  Comment wears hearing aids  Vision? N  Difficulty concentrating or making decisions? N  Walking or climbing stairs? N  Dressing or bathing? N  Doing errands, shopping? N  Preparing Food and eating ? N  Using the Toilet? N  In the past six months, have you accidently leaked urine? N  Do you have problems with loss of bowel control? N  Managing your Medications? N  Managing your Finances? N  Housekeeping or  managing your Housekeeping? N  Some recent data might be hidden    Patient Care Team: Jinny Sanders, MD as PCP - General (Family Medicine) Burnell Blanks, MD as PCP - Cardiology (Cardiology) Rutherford Guys, MD as Consulting Physician (Ophthalmology)  Indicate any recent Medical Services you may have received from other than [facility] providers in  the past year (date may be approximate).     Assessment:   This is a routine wellness examination for [place].  Hearing/Vision screen  Hearing Screening   125Hz  250Hz  500Hz  1000Hz  2000Hz  3000Hz  4000Hz  6000Hz  8000Hz   Right ear:           Left ear:           Vision Screening Comments: Patient gets annual eye exams.   Dietary issues and exercise activities discussed: Current Exercise Habits: Home exercise routine, Type of exercise: stretching, Time (Minutes): 10, Frequency (Times/Week): 7, Weekly Exercise (Minutes/Week): 70, Intensity: Mild, Exercise limited by: None identified  Goals    . Increase physical activity     Starting 03/07/2018, I will continue to stretch for 20-30 minutes daily.     . Patient Stated     06/17/2020, I will continue to stretch for about 10 minutes daily.       Depression Screen PHQ 2/9 Scores 06/17/2020 03/22/2019 03/07/2018 03/03/2017 01/21/2016 12/06/2013  PHQ - 2 Score 0 0 0 0 0 0  PHQ- 9 Score 0 - 0 1 - -    Fall Risk Fall Risk  06/17/2020 03/22/2019 03/07/2018 03/03/2017 01/21/2016  Falls in the past year? 0 0 No No No  Number falls in past yr: 0 - - - -  Injury with Fall? 0 - - - -  Risk Factor Category  - - - - -  Risk for fall due to : Medication side effect - - - -  Follow up Falls evaluation completed;Falls prevention discussed - - - -    FALL RISK PREVENTION PERTAINING TO THE HOME:  Any stairs in or around the home? Yes  If so, are there any without handrails? No  Home free of loose throw rugs in walkways, pet beds, electrical cords, etc? Yes  Adequate lighting in your home to reduce risk of  falls? Yes   ASSISTIVE DEVICES UTILIZED TO PREVENT FALLS:  Life alert? No  Use of a cane, walker or w/c? Yes  Grab bars in the bathroom? No  Shower chair or bench in shower? No  Elevated toilet seat or a handicapped toilet? No   TIMED UP AND GO:  Was the test performed? N/A, telephone visit.   Cognitive Function: MMSE - Mini Mental State Exam 06/17/2020 03/07/2018 03/03/2017 01/21/2016  Orientation to time 5 5 5 5   Orientation to Place 5 5 5 5   Registration 3 3 3 3   Attention/ Calculation 5 0 0 0  Recall 3 3 3 3   Language- name 2 objects - 0 0 0  Language- repeat - 1 1 1   Language- follow 3 step command - 3 3 3   Language- read & follow direction - 0 0 0  Write a sentence - 0 0 0  Copy design - 0 0 0  Total score - 20 20 20   Mini Cog  Mini-Cog screen was completed. Maximum score is 22. A value of 0 denotes this part of the MMSE was not completed or the patient failed this part of the Mini-Cog screening.       Immunizations Immunization History  Administered Date(s) Administered  . Fluad Quad(high Dose 65+) 03/22/2019  . PFIZER SARS-COV-2 Vaccination 09/08/2019, 10/08/2019  . Pneumococcal Conjugate-13 01/21/2016  . Pneumococcal Polysaccharide-23 06/19/2013    TDAP status: Due, Education has been provided regarding the importance of this vaccine. Advised may receive this vaccine at local pharmacy or Health Dept. Aware to provide a copy of the vaccination  record if obtained from local pharmacy or Health Dept. Verbalized acceptance and understanding.  Flu Vaccine status: Declined, Education has been provided regarding the importance of this vaccine but patient still declined. Advised may receive this vaccine at local pharmacy or Health Dept. Aware to provide a copy of the vaccination record if obtained from local pharmacy or Health Dept. Verbalized acceptance and understanding.  Pneumococcal vaccine status: Up to date  Covid-19 vaccine status: Completed vaccines  Qualifies  for Shingles Vaccine? Yes   Zostavax completed No   Shingrix Completed?: No.    Education has been provided regarding the importance of this vaccine. Patient has been advised to call insurance company to determine out of pocket expense if they have not yet received this vaccine. Advised may also receive vaccine at local pharmacy or Health Dept. Verbalized acceptance and understanding.  Screening Tests Health Maintenance  Topic Date Due  . FOOT EXAM  03/21/2020  . INFLUENZA VACCINE  10/08/2020 (Originally 02/09/2020)  . TETANUS/TDAP  03/04/2027 (Originally 12/25/1953)  . HEMOGLOBIN A1C  12/14/2020  . OPHTHALMOLOGY EXAM  03/11/2021  . COVID-19 Vaccine  Completed  . PNA vac Low Risk Adult  Completed    Health Maintenance  Health Maintenance Due  Topic Date Due  . FOOT EXAM  03/21/2020    Colorectal cancer screening: No longer required.   Lung Cancer Screening: (Low Dose CT Chest recommended if Age 78-80 years, 30 pack-year currently smoking OR have quit w/in 15years.) does not qualify  Additional Screening:  Hepatitis C Screening: does not qualify; Completed N/A  Vision Screening: Recommended annual ophthalmology exams for early detection of glaucoma and other disorders of the eye. Is the patient up to date with their annual eye exam?  Yes  Who is the provider or what is the name of the office in which the patient attends annual eye exams? Dr. Gershon Crane If pt is not established with a provider, would they like to be referred to a provider to establish care? No .   Dental Screening: Recommended annual dental exams for proper oral hygiene  Community Resource Referral / Chronic Care Management: CRR required this visit?  No   CCM required this visit?  No      Plan:     I have personally reviewed and noted the following in the patient's chart:   . Medical and social history . Use of alcohol, tobacco or illicit drugs  . Current medications and supplements . Functional ability  and status . Nutritional status . Physical activity . Advanced directives . List of other physicians . Hospitalizations, surgeries, and ER visits in previous 12 months . Vitals . Screenings to include cognitive, depression, and falls . Referrals and appointments  In addition, I have reviewed and discussed with patient certain preventive protocols, quality metrics, and best practice recommendations. A written personalized care plan for preventive services as well as general preventive health recommendations were provided to patient.   Due to this being a telephonic visit, the after visit summary with patients personalized plan was offered to patient via office or my-chart. Patient preferred to pick up at office at next visit or via mychart.   Andrez Grime, LPN   [id number]/11/[id number]

## 2020-06-17 NOTE — Progress Notes (Signed)
PCP notes:  Health Maintenance: Flu- declined Foot exam- due   Abnormal Screenings: none   Patient concerns: Needs something to help with low energy level   Nurse concerns: none   Next PCP appt.: 06/23/2020 @ 9:40 am

## 2020-06-23 ENCOUNTER — Other Ambulatory Visit: Payer: Self-pay

## 2020-06-23 ENCOUNTER — Encounter: Payer: Self-pay | Admitting: Family Medicine

## 2020-06-23 ENCOUNTER — Ambulatory Visit: Payer: Medicare HMO | Admitting: Family Medicine

## 2020-06-23 ENCOUNTER — Ambulatory Visit (INDEPENDENT_AMBULATORY_CARE_PROVIDER_SITE_OTHER): Payer: Medicare HMO | Admitting: Family Medicine

## 2020-06-23 VITALS — BP 118/68 | HR 89 | Temp 97.5°F | Resp 16 | Ht 66.0 in | Wt 185.0 lb

## 2020-06-23 DIAGNOSIS — M7918 Myalgia, other site: Secondary | ICD-10-CM | POA: Diagnosis not present

## 2020-06-23 DIAGNOSIS — E1169 Type 2 diabetes mellitus with other specified complication: Secondary | ICD-10-CM | POA: Diagnosis not present

## 2020-06-23 DIAGNOSIS — E785 Hyperlipidemia, unspecified: Secondary | ICD-10-CM | POA: Diagnosis not present

## 2020-06-23 DIAGNOSIS — E1159 Type 2 diabetes mellitus with other circulatory complications: Secondary | ICD-10-CM | POA: Diagnosis not present

## 2020-06-23 DIAGNOSIS — R5383 Other fatigue: Secondary | ICD-10-CM | POA: Diagnosis not present

## 2020-06-23 DIAGNOSIS — I152 Hypertension secondary to endocrine disorders: Secondary | ICD-10-CM

## 2020-06-23 DIAGNOSIS — Z0000 Encounter for general adult medical examination without abnormal findings: Secondary | ICD-10-CM

## 2020-06-23 DIAGNOSIS — E1142 Type 2 diabetes mellitus with diabetic polyneuropathy: Secondary | ICD-10-CM | POA: Diagnosis not present

## 2020-06-23 DIAGNOSIS — E1140 Type 2 diabetes mellitus with diabetic neuropathy, unspecified: Secondary | ICD-10-CM | POA: Diagnosis not present

## 2020-06-23 LAB — CBC WITH DIFFERENTIAL/PLATELET
Basophils Absolute: 0.1 10*3/uL (ref 0.0–0.1)
Basophils Relative: 0.8 % (ref 0.0–3.0)
Eosinophils Absolute: 0.3 10*3/uL (ref 0.0–0.7)
Eosinophils Relative: 3.9 % (ref 0.0–5.0)
HCT: 40.3 % (ref 39.0–52.0)
Hemoglobin: 13.4 g/dL (ref 13.0–17.0)
Lymphocytes Relative: 30.9 % (ref 12.0–46.0)
Lymphs Abs: 2.2 10*3/uL (ref 0.7–4.0)
MCHC: 33.4 g/dL (ref 30.0–36.0)
MCV: 87 fl (ref 78.0–100.0)
Monocytes Absolute: 0.7 10*3/uL (ref 0.1–1.0)
Monocytes Relative: 9.5 % (ref 3.0–12.0)
Neutro Abs: 3.9 10*3/uL (ref 1.4–7.7)
Neutrophils Relative %: 54.9 % (ref 43.0–77.0)
Platelets: 250 10*3/uL (ref 150.0–400.0)
RBC: 4.63 Mil/uL (ref 4.22–5.81)
RDW: 13.7 % (ref 11.5–15.5)
WBC: 7.2 10*3/uL (ref 4.0–10.5)

## 2020-06-23 LAB — VITAMIN D 25 HYDROXY (VIT D DEFICIENCY, FRACTURES): VITD: 23.81 ng/mL — ABNORMAL LOW (ref 30.00–100.00)

## 2020-06-23 LAB — VITAMIN B12: Vitamin B-12: 215 pg/mL (ref 211–911)

## 2020-06-23 LAB — HM DIABETES FOOT EXAM

## 2020-06-23 LAB — TSH: TSH: 1.24 u[IU]/mL (ref 0.35–4.50)

## 2020-06-23 NOTE — Patient Instructions (Signed)
Please stop at the lab to have labs drawn. We recommend COVID booster for protection from [place] 19 virus. Use tylenol 500 mg 2 tabs three times a day for pain in left buttock. Start home low back stretching.  Call if not improving.

## 2020-06-23 NOTE — Assessment & Plan Note (Signed)
Due to DM, stable control on no medication.

## 2020-06-23 NOTE — Assessment & Plan Note (Addendum)
Stable, chronic.  Continue current medication.  Crestor 5 mg daily... fatigue did not improve off this medication.

## 2020-06-23 NOTE — Assessment & Plan Note (Addendum)
Stable control on recheck in office, chronic.  Continue current medication. Lisinopril 40 mg daily, amlodipine  mg daily

## 2020-06-23 NOTE — Progress Notes (Signed)
Patient ID: [name], male    DOB: June 16, 1935, 85 y.o.   MRN: [id number]  This visit was conducted in person.  BP (!) 160/78 (BP Location: Left Arm, Patient Position: Sitting, Cuff Size: Normal)   Pulse 89   Temp (!) 97.5 F (36.4 C) (Oral)   Resp 16   Ht 5\' 6"  (1.676 m)   Wt 185 lb (83.9 kg)   SpO2 98%   BMI 29.86 kg/m    CC: Annual physical and review of chronic heath issues Subjective:   HPI: [name] is a 85 y.o. male presenting on 06/23/2020  The patient presents for complete physical and review of chronic health problems. He/She also has the following acute concerns today:  1. Left hip pain, in left buttock, off and on for years, worse in last few months. occ radiates to left thigh, chronic numbness in feet, no weakness Using tylenol  3 in AM... helps some.  Hx of low back pain  MRI 20355 IMPRESSION:  1. Severe multifactorial spinal stenosis at L4-5. A broad-based  foraminal disc protrusion on the right contributes to asymmetric  right foraminal stenosis and probable right L4 nerve root  encroachment.  2. Mild lateral recess and foraminal stenosis bilaterally at L5-S1  secondary to chronic disc degeneration and facet hypertrophy.  3. Disc degeneration at L1-2 contributes to mild narrowing of the  left lateral recess but no definite nerve root encroachment.   2. Fatigue, low energy for months  He feels it is from his medication side effects.   The patient saw a LPN or RN for medicare wellness visit on 06/17/2020.  Prevention and wellness was reviewed in detail. Note reviewed and important notes copied below. Health Maintenance: Flu- declined Foot exam- due Abnormal Screenings: none Patient concerns: Needs something to help with low energy level  Diabetes: Diet controlled, stable Using medications without difficulties: Hypoglycemic episodes:none Hyperglycemic episodes:none Feet problems:neuropathy due to DM Blood Sugars averaging: FBS 130 eye  exam within last year:  Hypertension:   Elevated in office today despite lisinopril, amlodipine .. per cards note in 3.2021 tolerating median of 140/90. On recheck BP at goal. BP Readings from Last 3 Encounters:  06/23/20 (!) 160/78  09/17/19 130/78  09/13/19 (!) 152/84  Using medication without problems or lightheadedness:  none Chest pain with exertion: none Edema:none Short of breath:none Average home BPs: At home 130-140/90 Other issues: Reviewed last note with Dr. Glennie Hawk  Elevated Cholesterol: LDL at goal on crestor Using medications without problems: Muscle aches:  Diet compliance: moderate Exercise: minimal walking Other complaints:      Relevant past medical, surgical, family and social history reviewed and updated as indicated. Interim medical history since our last visit reviewed. Allergies and medications reviewed and updated. Outpatient Medications Prior to Visit  Medication Sig Dispense Refill  . acetaminophen (TYLENOL) 500 MG tablet Take 500-1,000 mg by mouth 2 (two) times daily as needed (for pain).     Marland Kitchen amLODipine (NORVASC) 5 MG tablet Take 1 tablet (5 mg total) by mouth daily. 90 tablet 3  . clopidogrel (PLAVIX) 75 MG tablet Take 1 tablet by mouth once daily 90 tablet 3  . isosorbide mononitrate (IMDUR) 30 MG 24 hr tablet Take 1 tablet by mouth once daily 90 tablet 3  . lisinopril (ZESTRIL) 40 MG tablet Take 1 tablet by mouth once daily 90 tablet 3  . Multiple Vitamins-Minerals (ONE-A-DAY MENS HEALTH FORMULA PO) Take by mouth.    . nitroGLYCERIN (NITROSTAT)  0.4 MG SL tablet Place 1 tablet (0.4 mg total) under the tongue every 5 (five) minutes as needed for chest pain. 10 tablet 0  . pantoprazole (PROTONIX) 40 MG tablet Take 1 tablet by mouth once daily 90 tablet 1  . rosuvastatin (CRESTOR) 5 MG tablet Take 1 tablet (5 mg total) by mouth daily. 90 tablet 3   No facility-administered medications prior to visit.     Per HPI unless specifically indicated in ROS  section below Review of Systems Objective:  BP (!) 160/78 (BP Location: Left Arm, Patient Position: Sitting, Cuff Size: Normal)   Pulse 89   Temp (!) 97.5 F (36.4 C) (Oral)   Resp 16   Ht 5\' 6"  (1.676 m)   Wt 185 lb (83.9 kg)   SpO2 98%   BMI 29.86 kg/m   Wt Readings from Last 3 Encounters:  06/23/20 185 lb (83.9 kg)  09/17/19 182 lb 5 oz (82.7 kg)  09/13/19 185 lb 12.8 oz (84.3 kg)      Physical Exam Constitutional:      General: He is not in acute distress.    Appearance: Normal appearance. He is well-developed and well-nourished. He is not ill-appearing or toxic-appearing.  HENT:     Head: Normocephalic and atraumatic.     Right Ear: Hearing, tympanic membrane, ear canal and external ear normal.     Left Ear: Hearing, tympanic membrane, ear canal and external ear normal.     Nose: Nose normal.     Mouth/Throat:     Mouth: Oropharynx is clear and moist and mucous membranes are normal.     Pharynx: Uvula midline.  Eyes:     General: Lids are normal. Lids are everted, no foreign bodies appreciated.     Extraocular Movements: EOM normal.     Conjunctiva/sclera: Conjunctivae normal.     Pupils: Pupils are equal, round, and reactive to light.  Neck:     Thyroid: No thyroid mass or thyromegaly.     Vascular: No carotid bruit.     Trachea: Trachea and phonation normal.  Cardiovascular:     Rate and Rhythm: Normal rate and regular rhythm.     Pulses: Normal pulses and intact distal pulses.     Heart sounds: S1 normal and S2 normal. No murmur heard. No gallop.   Pulmonary:     Breath sounds: Normal breath sounds. No wheezing, rhonchi or rales.  Abdominal:     General: Bowel sounds are normal.     Palpations: Abdomen is soft. There is no hepatosplenomegaly.     Tenderness: There is no abdominal tenderness. There is no CVA tenderness, guarding or rebound.     Hernia: No hernia is present.  Musculoskeletal:     Cervical back: Normal, normal range of motion and neck supple.      Thoracic back: Normal.     Lumbar back: Tenderness present. No swelling, deformity or bony tenderness. Normal range of motion. Negative right straight leg raise test and negative left straight leg raise test.  Lymphadenopathy:     Cervical: No cervical adenopathy.  Skin:    General: Skin is warm, dry and intact.     Findings: No rash.  Neurological:     Mental Status: He is alert.     Cranial Nerves: No cranial nerve deficit.     Sensory: No sensory deficit.     Gait: Gait normal.     Deep Tendon Reflexes: Strength normal and reflexes are normal and symmetric.  Psychiatric:        Mood and Affect: Mood and affect normal.        Speech: Speech normal.        Behavior: Behavior normal.        Judgment: Judgment normal.      Diabetic foot exam: Normal inspection No skin breakdown No calluses  Normal DP pulses Normal sensation to light touch and monofilament Nails normal     Results for orders placed or performed in visit on 06/15/20  Comprehensive metabolic panel  Result Value Ref Range   Sodium 142 135 - 145 mEq/L   Potassium 3.9 3.5 - 5.1 mEq/L   Chloride 104 96 - 112 mEq/L   CO2 29 19 - 32 mEq/L   Glucose, Bld 124 (H) 70 - 99 mg/dL   BUN 15 6 - 23 mg/dL   Creatinine, Ser 0.86 0.40 - 1.50 mg/dL   Total Bilirubin 0.6 0.2 - 1.2 mg/dL   Alkaline Phosphatase 44 39 - 117 U/L   AST 17 0 - 37 U/L   ALT 14 0 - 53 U/L   Total Protein 7.1 6.0 - 8.3 g/dL   Albumin 4.4 3.5 - 5.2 g/dL   GFR 80.09 >60.00 mL/min   Calcium 9.5 8.4 - 10.5 mg/dL  Lipid panel  Result Value Ref Range   Cholesterol 132 0 - 200 mg/dL   Triglycerides 101.0 0.0 - 149.0 mg/dL   HDL 42.60 >39.00 mg/dL   VLDL 20.2 0.0 - 40.0 mg/dL   LDL Cholesterol 69 0 - 99 mg/dL   Total CHOL/HDL Ratio 3    NonHDL 89.19   Hemoglobin A1c  Result Value Ref Range   Hgb A1c MFr Bld 6.7 (H) 4.6 - 6.5 %    This visit occurred during the SARS-CoV-2 public health emergency.  Safety protocols were in place, including  screening questions prior to the visit, additional usage of staff PPE, and extensive cleaning of exam room while observing appropriate contact time as indicated for disinfecting solutions.   COVID 19 screen:  No recent travel or known exposure to COVID19 The patient denies respiratory symptoms of COVID 19 at this time. The importance of social distancing was discussed today.   Assessment and Plan The patient's preventative maintenance and recommended screening tests for an annual wellness exam were reviewed in full today. Brought up to date unless services declined.  Counselled on the importance of diet, exercise, and its role in overall health and mortality. The patient's FH and SH was reviewed, including their home life, tobacco status, and drug and alcohol status.    Vaccines: UPTODATEwith  Tdap, flu,  Due for COVID booster colonoscopy not indicated former smoker, remote  Problem List Items Addressed This Visit    DM type 2 with diabetic peripheral neuropathy (HCC) (Chronic)    Chronic, diet controlled, stable      Fatigue    Chronic, new worsening  Eval further with labs eval. No clear cardio pulmonary changes.  not on any clearly sedating medications.      Relevant Orders   VITAMIN D 25 Hydroxy (Vit-D Deficiency, Fractures)   TSH   CBC with Differential/Platelet   Vitamin B12   Hyperlipidemia associated with type 2 diabetes mellitus (HCC) (Chronic)    Stable, chronic.  Continue current medication.  Crestor 5 mg daily... fatigue did not improve off this medication.         Hypertension associated with diabetes ([place]) (Chronic)    Stable control on recheck in  office, chronic.  Continue current medication. Lisinopril 40 mg daily, amlodipine  mg daily         Left buttock pain     Acute,new Likely due to degenerative cahnges in lumbar spine see in 2015... can increase dosing of tylenol. Start home PT, info given. If not improving he will call Dr. Sherwood Gambler to  discuss eval of spine further, possible steroid injections.       Neuropathy due to type 2 diabetes mellitus (HCC) (Chronic)    Due to DM, stable control on no medication.       Other Visit Diagnoses    Routine general medical examination at a health care facility    -  Primary        Eliezer Lofts, MD

## 2020-06-23 NOTE — Assessment & Plan Note (Addendum)
Acute,new Likely due to degenerative cahnges in lumbar spine see in 2015... can increase dosing of tylenol. Start home PT, info given. If not improving he will call Dr. Sherwood Gambler to discuss eval of spine further, possible steroid injections.

## 2020-06-23 NOTE — Assessment & Plan Note (Addendum)
Chronic, new worsening  Eval further with labs eval. No clear cardio pulmonary changes.  not on any clearly sedating medications.

## 2020-06-23 NOTE — Assessment & Plan Note (Signed)
Chronic, diet controlled, stable.  °

## 2020-07-10 ENCOUNTER — Other Ambulatory Visit: Payer: Self-pay | Admitting: Family Medicine

## 2020-09-04 ENCOUNTER — Ambulatory Visit: Payer: Medicare HMO | Admitting: Cardiovascular Disease

## 2020-09-04 ENCOUNTER — Other Ambulatory Visit: Payer: Self-pay

## 2020-09-04 ENCOUNTER — Encounter: Payer: Self-pay | Admitting: Cardiovascular Disease

## 2020-09-04 VITALS — BP 140/60 | HR 71 | Ht 66.0 in | Wt 185.0 lb

## 2020-09-04 DIAGNOSIS — E7800 Pure hypercholesterolemia, unspecified: Secondary | ICD-10-CM

## 2020-09-04 DIAGNOSIS — I10 Essential (primary) hypertension: Secondary | ICD-10-CM | POA: Diagnosis not present

## 2020-09-04 DIAGNOSIS — I441 Atrioventricular block, second degree: Secondary | ICD-10-CM

## 2020-09-04 DIAGNOSIS — I25810 Atherosclerosis of coronary artery bypass graft(s) without angina pectoris: Secondary | ICD-10-CM

## 2020-09-04 NOTE — Patient Instructions (Signed)

## 2020-09-04 NOTE — Progress Notes (Signed)
Chief Complaint  Patient presents with  . Follow-up    CAD    History of Present Illness: 86 yo male with history of CAD s/p CABG 2016, prior coronary stent placement before his CABG, HTN, hyperlipidemia, atrial fibrillation and GERD who presents today for cardiac follow up. He has been followed for CAD since 2005 when he had a LAD stent placed. He has not tolerated statins in the past secondary to myalgias. He was evaluated April 2016 for chest pain. Cardiac catheterization 10/31/14 with multivessel disease with severe 95% distal left main stenosis. He underwent 4V CABG (LIMA-LAD, SVG-DX, SVG-PDA, SVG-OM) April 2016. Echo April 2017 with normal LV function and no significant valvular disease. He was seen in July 2019 for chest pain, palpitations and uncontrolled blood pressure. Nuclear stress test July 2019 with no ischemia. Echo July 2019 with LVEF=60-65% with grade 1 diastolic dysfunction. Mild MR. Cardiac monitor July 2019 with type 2 second degree AV block and junctional bradycardia with frequent PACs. HIs beta blocker was stopped. He felt better after stopping the beta blocker. He had trouble speaking in December 2020 and EMS strips showed sinus with PACs.  EKG in ED with sinus. He is still wearing the cardiac monitor and my review of preliminary strips shows no evidence of atrial fibrillation. He is in sinus with rare PACs. Echo February 2021 with LVEF=60-65%. Mild LVH. No significant valve disease. Cardiac monitor march 2021 with Sinus, Mobitz 1 block, no atrial fib.   He is here today for follow up. The patient denies any chest pain, dyspnea, palpitations, lower extremity edema, orthopnea, PND, dizziness, near syncope or syncope.    Primary Care Physician: Jinny Sanders, MD  Past Medical History:  Diagnosis Date  . CAD (coronary artery disease)    s/p stent mid LAD 2005 // s/p CABG 2016 // Myoview 8/19:  EF 64, no ischemia  . Coronary artery disease involving native coronary artery of  native heart without angina pectoris   . Depression   . DJD (degenerative joint disease)   . DM type 2 with diabetic peripheral neuropathy ([place]) 03/10/2017  . E coli bacteremia 04/20/2016  . Essential hypertension 04/20/2016  . GERD (gastroesophageal reflux disease)   . History of echocardiogram    Echo 8/19: Moderate LVH, EF 60-65, normal wall motion, grade 1 diastolic dysfunction, mild MR, mild LAE, normal RVSF, mild TR, PASP 25  . History of hiatal hernia   . HTN (hypertension)   . Hyperlipidemia   . Neuropathy due to type 2 diabetes mellitus ([place]) 03/10/2017  . PERCUTANEOUS TRANSLUMINAL CORONARY ANGIOPLASTY, HX OF 11/26/2007   Annotation: With CYPHER STENT. Qualifier: Diagnosis of  By: [place], [place]    . Pre-diabetes   . S/P CABG x 4 11/03/2014  . S/P total knee replacement 04/11/2016  . Sepsis due to Escherichia coli (E. coli) ([place]) 04/20/2016  . Tinea pedis 09/08/2017    Past Surgical History:  Procedure Laterality Date  . BRAIN SURGERY    . CARDIAC CATHETERIZATION    . CORONARY ARTERY BYPASS GRAFT N/A 11/03/2014   Procedure: CORONARY ARTERY BYPASS GRAFTING (CABG), ON PUMP, TIMES FOUR, USING LEFT INTERNAL MAMMARY, RIGHT GREATER SAPHENOUS VEIN HARVESTED ENDOSCOPICALLY;  Surgeon: Ivin Poot, MD;  Location: [facility];  Service: Open Heart Surgery;  Laterality: N/A;  LIMA-LAD; SVG-DIAG; SVG-OM; SVG-RCA  . CRANIOTOMY N/A 01/18/2014   Procedure: CRANIOTOMY HEMATOMA EVACUATION SUBDURAL;  Surgeon: Hosie Spangle, MD;  Location: [facility];  Service: Neurosurgery;  Laterality:  N/A;  . kidney stone removal    . KNEE SURGERY    . LEFT HEART CATHETERIZATION WITH CORONARY ANGIOGRAM N/A 07/27/2011   Procedure: LEFT HEART CATHETERIZATION WITH CORONARY ANGIOGRAM;  Surgeon: Burnell Blanks, MD;  Location: [facility] CATH LAB;  Service: Cardiovascular;  Laterality: N/A;  . LEFT HEART CATHETERIZATION WITH CORONARY ANGIOGRAM N/A 10/31/2014   Procedure: LEFT HEART CATHETERIZATION WITH CORONARY  ANGIOGRAM;  Surgeon: Sherren Mocha, MD;  Location: [facility] CATH LAB;  Service: Cardiovascular;  Laterality: N/A;  . PTCA     Hx of it.   Marland Kitchen SIGMOIDOSCOPY    . TEE WITHOUT CARDIOVERSION N/A 11/03/2014   Procedure: TRANSESOPHAGEAL ECHOCARDIOGRAM (TEE);  Surgeon: Ivin Poot, MD;  Location: [facility];  Service: Open Heart Surgery;  Laterality: N/A;  . TOTAL KNEE ARTHROPLASTY  02/2011   Right knee  . TOTAL KNEE ARTHROPLASTY Left 04/11/2016   Procedure: LEFT TOTAL KNEE ARTHROPLASTY;  Surgeon: Vickey Huger, MD;  Location: [facility];  Service: Orthopedics;  Laterality: Left;    Current Outpatient Medications  Medication Sig Dispense Refill  . acetaminophen (TYLENOL) 500 MG tablet Take 500-1,000 mg by mouth 2 (two) times daily as needed (for pain).     Marland Kitchen amLODipine (NORVASC) 5 MG tablet Take 1 tablet (5 mg total) by mouth daily. 90 tablet 3  . clopidogrel (PLAVIX) 75 MG tablet Take 1 tablet by mouth once daily 90 tablet 3  . isosorbide mononitrate (IMDUR) 30 MG 24 hr tablet Take 1 tablet by mouth once daily 90 tablet 3  . lisinopril (ZESTRIL) 40 MG tablet Take 1 tablet by mouth once daily 90 tablet 3  . Multiple Vitamins-Minerals (ONE-A-DAY MENS HEALTH FORMULA PO) Take by mouth.    . nitroGLYCERIN (NITROSTAT) 0.4 MG SL tablet Place 1 tablet (0.4 mg total) under the tongue every 5 (five) minutes as needed for chest pain. 10 tablet 0  . pantoprazole (PROTONIX) 40 MG tablet Take 1 tablet by mouth once daily 90 tablet 3  . rosuvastatin (CRESTOR) 5 MG tablet Take 1 tablet (5 mg total) by mouth daily. 90 tablet 3   No current facility-administered medications for this visit.    Allergies  Allergen Reactions  . Protamine Rash    Hypotension, Rash, unstable vitals.    . Statins Other (See Comments)    Caused soreness (Lipitor, Zocor)  . Adhesive [Tape] Other (See Comments)  . Rosuvastatin Calcium Other (See Comments)    Caused soreness (Lipitor, Zocor)  . Imodium [Loperamide] Itching and Rash    Social  History   Socioeconomic History  . Marital status: Married    Spouse name: Not on file  . Number of children: Not on file  . Years of education: Not on file  . Highest education level: Not on file  Occupational History  . Not on file  Tobacco Use  . Smoking status: Former Research scientist (life sciences)  . Smokeless tobacco: Former Systems developer  . Tobacco comment: quit in 1985. smoked 1ppd for 35 years   Vaping Use  . Vaping Use: Never used  Substance and Sexual Activity  . Alcohol use: No  . Drug use: No  . Sexual activity: Never  Other Topics Concern  . Not on file  Social History Narrative   Married with children. 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 10:20 am    Has living will,  HCPOA: jerrie Galyon, full code (reviewed 51)   Social Determinants of Health   Financial Resource Strain: Low Risk   .  Difficulty of Paying Living Expenses: Not hard at all  Food Insecurity: No Food Insecurity  . Worried About Charity fundraiser in the Last Year: Never true  . Ran Out of Food in the Last Year: Never true  Transportation Needs: No Transportation Needs  . Lack of Transportation (Medical): No  . Lack of Transportation (Non-Medical): No  Physical Activity: Insufficiently Active  . Days of Exercise per Week: 7 days  . Minutes of Exercise per Session: 10 min  Stress: No Stress Concern Present  . Feeling of Stress : Not at all  Social Connections: Not on file  Intimate Partner Violence: Not At Risk  . Fear of Current or Ex-Partner: No  . Emotionally Abused: No  . Physically Abused: No  . Sexually Abused: No    Family History  Problem Relation Age of Onset  . Heart disease Father   . Heart attack Father   . Heart failure Mother   . COPD Mother   . Heart disease Sister   . Heart attack Sister   . Heart attack Brother     Review of Systems:  As stated in the HPI and otherwise negative.   BP 140/60   Pulse 71   Ht 5\' 6"  (1.676 m)   Wt 185 lb (83.9 kg)    SpO2 96%   BMI 29.86 kg/m   Physical Examination:  General: Well developed, well nourished, NAD  HEENT: OP clear, mucus membranes moist  SKIN: warm, dry. No rashes. Neuro: No focal deficits  Musculoskeletal: Muscle strength 5/5 all ext  Psychiatric: Mood and affect normal  Neck: No JVD, no carotid bruits, no thyromegaly, no lymphadenopathy.  Lungs:Clear bilaterally, no wheezes, rhonci, crackles Cardiovascular: Regular rate and rhythm. No murmurs, gallops or rubs. Abdomen:Soft. Bowel sounds present. Non-tender.  Extremities: No lower extremity edema. Pulses are 2 + in the bilateral DP/PT.  Echo February 2021:  1. Left ventricular ejection fraction, by visual estimation, is 60 to  65%. The left ventricle has normal function. Left ventricular septal wall  thickness was mildly increased. Mildly increased left ventricular  posterior wall thickness. There is mildly  increased left ventricular hypertrophy.  2. Left ventricular diastolic parameters are consistent with Grade I  diastolic dysfunction (impaired relaxation).  3. Intracavitary gradient noted. Peak velocity 1.45 m/s. Peak gradient  8.4 mmHg.  4. Global right ventricle has normal systolic function.The right  ventricular size is normal. No increase in right ventricular wall  thickness.  5. Left atrial size was normal.  6. Right atrial size was normal.  7. The mitral valve is normal in structure. No evidence of mitral valve  regurgitation. No evidence of mitral stenosis.  8. The tricuspid valve is normal in structure.  9. The tricuspid valve is normal in structure. Tricuspid valve  regurgitation is mild.  10. The aortic valve is tricuspid. Aortic valve regurgitation is not  visualized. No evidence of aortic valve sclerosis or stenosis.  11. The pulmonic valve was normal in structure. Pulmonic valve  regurgitation is not visualized.  12. Normal pulmonary artery systolic pressure.  13. The inferior vena cava is  normal in size with greater than 50%  respiratory variability, suggesting right atrial pressure of 3 mmHg.   48 Hr Holter 02/07/18 Type 2 second degree AV block Junctional bradycardia.  Frequent Premature atrial contractions (3898 during monitoring period) Rare premature ventricular contractions   Nuclear stress test 01/10/18  Nuclear stress EF: 64% with septal wall hypokinesis  There was no ST  segment deviation noted during stress.  The study is normal.  This is a low risk study. No ischemia identified.  EKG:  EKG is  ordered today. The ekg ordered today demonstrates sinus, RBBB, blocked PAC vs 2nd degree AV block  Recent Labs: 06/15/2020: ALT 14; BUN 15; Creatinine, Ser 0.86; Potassium 3.9; Sodium 142 06/23/2020: Hemoglobin 13.4; Platelets 250.0; TSH 1.24   Lipid Panel    Component Value Date/Time   CHOL 132 06/15/2020 0938   CHOL 122 09/22/2016 0912   TRIG 101.0 06/15/2020 0938   HDL 42.60 06/15/2020 0938   HDL 39 (L) 09/22/2016 0912   CHOLHDL 3 06/15/2020 0938   VLDL 20.2 06/15/2020 0938   LDLCALC 69 06/15/2020 0938   LDLCALC 63 09/22/2016 0912     Wt Readings from Last 3 Encounters:  09/04/20 185 lb (83.9 kg)  06/23/20 185 lb (83.9 kg)  09/17/19 182 lb 5 oz (82.7 kg)     Other studies Reviewed: Additional studies/ records that were reviewed today include: . Review of the above records demonstrates:    Assessment and Plan:   1. CAD without angina: He is s/p 4V CABG in 2016. Nuclear stress test July 2019 with no ischemia. LV function normal by echo in February 2021. No chest pain. Continue Imdur, Plavix and Crestor.    2. HTN: BP is controlled. No changes today  3. Hyperlipidemia: LDL at goal in December 2021. Continue statin      4. Bradycardia/Second Degree AV block: No dizziness  5. Snoring/Daytime Somnolence: He does not wish to arrange a sleep study.  Current medicines are reviewed at length with the patient today.  The patient does not have  concerns regarding medicines.  The following changes have been made:  no change  Labs/ tests ordered today include:   Orders Placed This Encounter  Procedures  . EKG 12-Lead    Disposition:   FU with me in 12  months  Signed, Lauree Chandler, MD 09/04/2020 3:09 PM    [facility] [place], [place], [place]  [address] Phone: [phone]; Fax: [phone]

## 2020-09-07 ENCOUNTER — Other Ambulatory Visit: Payer: Self-pay | Admitting: Cardiovascular Disease

## 2020-09-07 ENCOUNTER — Other Ambulatory Visit: Payer: Self-pay | Admitting: Physician Assistant

## 2020-10-14 ENCOUNTER — Telehealth: Payer: Self-pay | Admitting: Family Medicine

## 2020-10-14 NOTE — Chronic Care Management (AMB) (Signed)
  Chronic Care Management   Note  10/14/2020 Name: [name] MRN: [id number] DOB: August 25, 1934  [name] is a 86 y.o. year old male who is a primary care patient of Bedsole, Amy E, MD. I reached out to [name] by phone today in response to a referral sent by Mr. Judy Pimple PCP, Jinny Sanders, MD.   [name] was given information about Chronic Care Management services today including:  1. CCM service includes personalized support from designated clinical staff supervised by his physician, including individualized plan of care and coordination with other care providers 2. 24/7 contact phone numbers for assistance for urgent and routine care needs. 3. Service will only be billed when office clinical staff spend 20 minutes or more in a month to coordinate care. 4. Only one practitioner may furnish and bill the service in a calendar month. 5. The patient may stop CCM services at any time (effective at the end of the month) by phone call to the office staff.   Patient agreed to services and verbal consent obtained.   Follow up plan:   Lauretta Grill Upstream Scheduler

## 2020-10-15 ENCOUNTER — Other Ambulatory Visit: Payer: Self-pay | Admitting: Cardiovascular Disease

## 2020-10-15 DIAGNOSIS — I152 Hypertension secondary to endocrine disorders: Secondary | ICD-10-CM

## 2020-10-15 DIAGNOSIS — E1159 Type 2 diabetes mellitus with other circulatory complications: Secondary | ICD-10-CM

## 2020-10-16 ENCOUNTER — Other Ambulatory Visit: Payer: Self-pay | Admitting: Cardiovascular Disease

## 2020-10-16 DIAGNOSIS — I152 Hypertension secondary to endocrine disorders: Secondary | ICD-10-CM

## 2020-10-16 DIAGNOSIS — E1159 Type 2 diabetes mellitus with other circulatory complications: Secondary | ICD-10-CM

## 2020-10-26 ENCOUNTER — Telehealth: Payer: Self-pay

## 2020-10-26 NOTE — Chronic Care Management (AMB) (Addendum)
Chronic Care Management Pharmacy Assistant   Name: [name]  MRN: [id number] DOB: Sep 08, 1934  [name] is an 86 y.o. year old male who presents for his initial CCM visit with the clinical pharmacist.  Reason for Encounter: Initial Questions and Chart Review for CCM Appointment 11/03/20   Conditions to be addressed/monitored: CAD, HTN, HLD, DMII and Peripheral neuropathy  Recent office visits:  06/23/20- Dr. Eliezer Lofts- PCP- Labs ordered Vitamin D, CBC with diff, TSH, Vitamin B12. Can increase tylenol dosage due to left buttock pain. Start home PT. Referral to Dr. Sherwood Gambler if not improving. Continue taking Crestor since fatigue did not improve. No medication changes.   06/17/20- [name]- NP- No data available   Recent consult visits:  09/04/20- Dr. Lauree Chandler- Cardiology- EKG. No medication changes. Follow up in 12 months. 06/08/20- Dr. Kirkland Hun- Dermatology- No data available 05/26/20- Dr. Kirkland Hun- Dermatology- No data available  Hospital visits:  None in previous 6 months  Medications: Outpatient Encounter Medications as of 10/26/2020  Medication Sig   acetaminophen (TYLENOL) 500 MG tablet Take 500-1,000 mg by mouth 2 (two) times daily as needed (for pain).    amLODipine (NORVASC) 5 MG tablet Take 1 tablet by mouth once daily   clopidogrel (PLAVIX) 75 MG tablet Take 1 tablet by mouth once daily   isosorbide mononitrate (IMDUR) 30 MG 24 hr tablet Take 1 tablet by mouth once daily   lisinopril (ZESTRIL) 40 MG tablet Take 1 tablet by mouth once daily   Multiple Vitamins-Minerals (ONE-A-DAY MENS HEALTH FORMULA PO) Take by mouth.   nitroGLYCERIN (NITROSTAT) 0.4 MG SL tablet Place 1 tablet (0.4 mg total) under the tongue every 5 (five) minutes as needed for chest pain.   pantoprazole (PROTONIX) 40 MG tablet Take 1 tablet by mouth once daily   rosuvastatin (CRESTOR) 5 MG tablet Take 1 tablet (5 mg total) by mouth daily.   No facility-administered  encounter medications on file as of 10/26/2020.   Have you seen any other providers since your last visit? Yes 09/04/20- Dr. Lauree Chandler- Cardiology   Any changes in your medications or health? No  Any side effects from any medications? No  Do you have an symptoms or problems not managed by your medications? States his balance is off. He states it has been this way since having surgery on his head about 5 years ago.   Any concerns about your health right now? Concerned about his balance.   Has your provider asked that you check blood pressure, blood sugar, or follow special diet at home? Yes states he checks his blood pressure and sugar but not as frequently as he should.   Do you get any type of exercise on a regular basis? He is active. States he does his chores. Sold house so he will be moving soon and cleaning out house.   Can you think of a goal you would like to reach for your health? He wants to maintain a good blood pressure, blood sugar and avoid having back surgery.   Do you have any problems getting your medications? Not usually.   Is there anything that you would like to discuss during the appointment? No.   Patient reminded to have all medications, supplements and any BG or BP readings available for review with Debbora Dus at his 11/03/20 telephone appointment at 11:00  Star Rating Drugs: Lisinopril 40 mg 10/16/20  90 DS Rosuvastatin 5 mg 03/19/20 90 DS   Follow-Up:  Pharmacist Review  Debbora Dus, CPP notified  Margaretmary Dys, [place] Assistant [phone]  I have reviewed the care management and care coordination activities outlined in this encounter and I am certifying that I agree with the content of this note. No further action required.  Debbora Dus, PharmD Clinical Pharmacist [facility] at [facility] [phone]

## 2020-11-03 ENCOUNTER — Other Ambulatory Visit: Payer: Self-pay

## 2020-11-03 ENCOUNTER — Ambulatory Visit (INDEPENDENT_AMBULATORY_CARE_PROVIDER_SITE_OTHER): Payer: Medicare HMO

## 2020-11-03 DIAGNOSIS — E1169 Type 2 diabetes mellitus with other specified complication: Secondary | ICD-10-CM

## 2020-11-03 DIAGNOSIS — E1142 Type 2 diabetes mellitus with diabetic polyneuropathy: Secondary | ICD-10-CM | POA: Diagnosis not present

## 2020-11-03 DIAGNOSIS — I152 Hypertension secondary to endocrine disorders: Secondary | ICD-10-CM

## 2020-11-03 DIAGNOSIS — E785 Hyperlipidemia, unspecified: Secondary | ICD-10-CM | POA: Diagnosis not present

## 2020-11-03 DIAGNOSIS — E1159 Type 2 diabetes mellitus with other circulatory complications: Secondary | ICD-10-CM

## 2020-11-03 NOTE — Patient Instructions (Signed)
Dear [name],  Below is a summary of the goals we discussed during our follow up appointment on November 03, 2020. Please contact me anytime with questions or concerns.   Visit Information  Patient Care Plan: CCM Pharmacy Care Plan    Problem Identified: CH[facility] AMB "PATIENT-SPECIFIC PROBLEM"     Long-Range Goal: Disease Management   Start Date: 11/03/2020  Priority: High  Note:   Current Barriers:  . Home blood pressure elevated  Pharmacist Clinical Goal(s):  Marland Kitchen Patient will achieve adherence to monitoring guidelines and medication adherence to achieve therapeutic efficacy through collaboration with PharmD and provider.   Interventions: . 1:1 collaboration with Jinny Sanders, MD regarding development and update of comprehensive plan of care as evidenced by provider attestation and co-signature . Inter-disciplinary care team collaboration (see longitudinal plan of care) . Comprehensive medication review performed; medication list updated in electronic medical record  Hypertension (BP goal <140/90) -Not ideally controlled - Clinic readings within goal, but home readings elevated -Current treatment: . Amlodipine 5 mg - 1 tablet daily (not taking - patient does not recall last dose) . Lisinopril 40 mg - 1 tablet daily (bedtime) -Medications previously tried: metoprolol - pt reports stopped due to low heart rate  -Using automatic arm cuff for home checks -States his BP is fluctuating a lot -Current home readings: 195/102, recheck third time 156/76 (last week, in the morning) -Reports doctor wants BP about 140/90 -Current dietary habits: none specific, limits sweets -Current exercise habits: gardening, no formal exercise  -Reports hypotensive (dizziness, imbalance)/ denies hypertensive symptoms -We discussed dizziness/imbalance, no hypotension. I do not believe this is related to his BP medications.  -Reports he has chest pain at times and sob with certain activities. None out of the  normal. -Educated on BP goals and benefits of medications for prevention of heart attack, stroke and kidney damage; -Counseled to monitor BP at home daily for 5 days, document, and provide log at future appointments -Recommended to continue current medication; Patient called pharmacy to refill amlodipine. Realized he has some at home just hadn't been taking. Resume amlodipine 5 mg daily per medication list.  Coronary Artery Disease (Control symptoms/prevention) -Controlled - patient reports no abnormal chest pain or cardiac symptoms  Current treatment:  . Imdur 30 mg - 1 tablet daily (bedtime) . Nitroglycerin 0.4 mg SL as needed (none needed recently) . Plavix 75 mg - 1 tablet daily (morning) -Medications previously tried: aspirin -Recommend continue current medications  Hyperlipidemia: (LDL goal < 70) -Controlled - LDL 69 -Current treatment: . Rosuvastatin 5 mg - 1 tablet daily (bedtime) -Medications previously tried: none  -He has this on hand. Refill history behind as it was stopped short term due to fatigue, joint pain. This did not resolve off medication. Restarted at last PCP visit 06/2020. -Educated on Cholesterol goals;  -Recommended to continue current medication  Diabetes (A1c goal <7%) -Controlled - A1c 6.7% -Current medications: . None -Medications previously tried: none  -Current home glucose readings - not checking routinely . fasting glucose: 100-110 (before lunch) . post prandial glucose: n/a -Denies hypoglycemic/hyperglycemic symptoms -Current meal patterns: limit sweets; biggest problem is bread and potatoes -Educated on A1c and blood sugar goals; -Counseled to check feet daily and get yearly eye exams - patient reports annual eye exam completed 2021, foot exam up to date -Recommended to continue current medication  Patient Goals/Self-Care Activities . Patient will:  - focus on medication adherence by using pillbox, comparing to med list  Follow Up  Plan:  Telephone follow up appointment with care management team member scheduled for: 6 months for pharmacist review, 2 weeks blood pressure log     [name] was given information about Chronic Care Management services today including:  1. CCM service includes personalized support from designated clinical staff supervised by his physician, including individualized plan of care and coordination with other care providers 2. 24/7 contact phone numbers for assistance for urgent and routine care needs. 3. Standard insurance, coinsurance, copays and deductibles apply for chronic care management only during months in which we provide at least 20 minutes of these services. Most insurances cover these services at 100%, however patients may be responsible for any copay, coinsurance and/or deductible if applicable. This service may help you avoid the need for more expensive face-to-face services. 4. Only one practitioner may furnish and bill the service in a calendar month. 5. The patient may stop CCM services at any time (effective at the end of the month) by phone call to the office staff.  Patient agreed to services and verbal consent obtained.   The patient verbalized understanding of instructions, educational materials, and care plan provided today and agreed to receive a mailed copy of patient instructions, educational materials, and care plan.   Debbora Dus, PharmD Clinical Pharmacist [facility] at [facility] [phone]   Blood Pressure Record Sheet To take your blood pressure, you will need a blood pressure machine. You can buy a blood pressure machine (blood pressure monitor) at your clinic, drug store, or online. When choosing one, consider:  An automatic monitor that has an arm cuff.  A cuff that wraps snugly around your upper arm. You should be able to fit only one finger between your arm and the cuff.  A device that stores blood pressure reading results.  Do not choose a  monitor that measures your blood pressure from your wrist or finger. Follow your health care provider's instructions for how to take your blood pressure. To use this form:  Get one reading in the morning (a.m.) before you take any medicines.  Get one reading in the evening (p.m.) before supper.  Take at least 2 readings with each blood pressure check. This makes sure the results are correct. Wait 1-2 minutes between measurements.  Write down the results in the spaces on this form.  Repeat this once a week, or as told by your health care provider.  Make a follow-up appointment with your health care provider to discuss the results. Blood pressure log Date: _______________________  a.m. _____________________(1st reading) _____________________(2nd reading)  p.m. _____________________(1st reading) _____________________(2nd reading) Date: _______________________  a.m. _____________________(1st reading) _____________________(2nd reading)  p.m. _____________________(1st reading) _____________________(2nd reading) Date: _______________________  a.m. _____________________(1st reading) _____________________(2nd reading)  p.m. _____________________(1st reading) _____________________(2nd reading) Date: _______________________  a.m. _____________________(1st reading) _____________________(2nd reading)  p.m. _____________________(1st reading) _____________________(2nd reading) Date: _______________________  a.m. _____________________(1st reading) _____________________(2nd reading)  p.m. _____________________(1st reading) _____________________(2nd reading) This information is not intended to replace advice given to you by your health care provider. Make sure you discuss any questions you have with your health care provider. Document Revised: 10/16/2019 Document Reviewed: 10/16/2019 Elsevier Patient Education  2021 [employer].

## 2020-11-03 NOTE — Progress Notes (Signed)
Chronic Care Management Pharmacy Note  11/03/2020 Name:  [name] MRN:  [id number] DOB:  [birth year]  Subjective: [name] is an 86 y.o. year old male who is a primary patient of Bedsole, Amy E, MD.  The CCM team was consulted for assistance with disease management and care coordination needs.    Engaged with patient by telephone for initial visit in response to provider referral for pharmacy case management and/or care coordination services.   Consent to Services:  The patient was given the following information about Chronic Care Management services today, agreed to services, and gave verbal consent: 1. CCM service includes personalized support from designated clinical staff supervised by the primary care provider, including individualized plan of care and coordination with other care providers 2. 24/7 contact phone numbers for assistance for urgent and routine care needs. 3. Service will only be billed when office clinical staff spend 20 minutes or more in a month to coordinate care. 4. Only one practitioner may furnish and bill the service in a calendar month. 5.The patient may stop CCM services at any time (effective at the end of the month) by phone call to the office staff. 6. The patient will be responsible for cost sharing (co-pay) of up to 20% of the service fee (after annual deductible is met). Patient agreed to services and consent obtained.  Patient Care Team: Jinny Sanders, MD as PCP - General (Family Medicine) Burnell Blanks, MD as PCP - Cardiology (Cardiology) Rutherford Guys, MD as Consulting Physician (Ophthalmology) Debbora Dus, [facility] as Pharmacist (Pharmacist)  Recent office visits: 06/23/20- Dr. Eliezer Lofts- PCP- Labs ordered Vitamin D, CBC with diff, TSH, Vitamin B12. Can increase tylenol dosage due to left buttock pain. Start home PT. Referral to Dr. Sherwood Gambler if not improving. Continue taking Crestor since fatigue did not improve. Start vitamin D 400 IU  twice daily and B12 1000 mg daily.    Recent consult visits:  09/04/20- Dr. Lauree Chandler- Cardiology- EKG. No medication changes. Follow up in 12 months.  Hospital visits: None in previous 6 months  Objective:  Lab Results  Component Value Date   CREATININE 0.86 06/15/2020   BUN 15 06/15/2020   GFR 80.09 06/15/2020   GFRNONAA 80 09/24/2018   GFRAA 92 09/24/2018   NA 142 06/15/2020   K 3.9 06/15/2020   CALCIUM 9.5 06/15/2020   CO2 29 06/15/2020   GLUCOSE 124 (H) 06/15/2020    Lab Results  Component Value Date/Time   HGBA1C 6.7 (H) 06/15/2020 09:38 AM   HGBA1C 6.4 (A) 09/17/2019 02:14 PM   HGBA1C 6.7 (H) 03/13/2019 09:02 AM   GFR 80.09 06/15/2020 09:38 AM   GFR 87.44 03/13/2019 09:02 AM   MICROALBUR 18.5 (H) 09/11/2018 11:09 AM    Last diabetic Eye exam: None per chart  Last diabetic Foot exam:  Lab Results  Component Value Date/Time   HMDIABFOOTEX done 06/23/2020 12:00 AM     Lab Results  Component Value Date   CHOL 132 06/15/2020   HDL 42.60 06/15/2020   LDLCALC 69 06/15/2020   TRIG 101.0 06/15/2020   CHOLHDL 3 06/15/2020    Hepatic Function Latest Ref Rng & Units 06/15/2020 03/13/2019 09/11/2018  Total Protein 6.0 - 8.3 g/dL 7.1 6.6 6.7  Albumin 3.5 - 5.2 g/dL 4.4 4.1 4.3  AST 0 - 37 U/L _0 ALT 0 - 53 U/L _1 Alk Phosphatase 39 - 117 U/L 44 45 54  Total Bilirubin  0.2 - 1.2 mg/dL 0.6 0.6 0.6  Bilirubin, Direct 0.00 - 0.40 mg/dL - - -    Lab Results  Component Value Date/Time   TSH 1.24 06/23/2020 11:00 AM   TSH 2.07 01/21/2016 02:35 PM    CBC Latest Ref Rng & Units 06/23/2020 03/03/2017 10/11/2016  WBC 4.0 - 10.5 K/uL 7.2 7.0 9.0  Hemoglobin 13.0 - 17.0 g/dL 13.4 13.2 12.4(L)  Hematocrit 39.0 - 52.0 % 40.3 40.0 37.4(L)  Platelets 150.0 - 400.0 K/uL 250.0 230.0 240.0    Lab Results  Component Value Date/Time   VD25OH 23.81 (L) 06/23/2020 11:00 AM    Clinical ASCVD: Yes  - CAD The ASCVD Risk score Mikey Bussing DC Jr., et al., 2013)  failed to calculate for the following reasons:   The 2013 ASCVD risk score is only valid for ages 66 to 95    Depression screen PHQ 2/9 06/17/2020 03/22/2019 03/07/2018  Decreased Interest 0 0 0  Down, Depressed, Hopeless 0 0 0  PHQ - 2 Score 0 0 0  Altered sleeping 0 - 0  Tired, decreased energy 0 - 0  Change in appetite 0 - 0  Feeling bad or failure about yourself  0 - 0  Trouble concentrating 0 - 0  Moving slowly or fidgety/restless 0 - 0  Suicidal thoughts 0 - 0  PHQ-9 Score 0 - 0  Difficult doing work/chores Not difficult at all - Not difficult at all    Social History   Tobacco Use  Smoking Status Former Smoker  Smokeless Tobacco Former Systems developer  Tobacco Comment   quit in 1985. smoked 1ppd for 35 years    BP Readings from Last 3 Encounters:  09/04/20 140/60  06/23/20 118/68  09/17/19 130/78   Pulse Readings from Last 3 Encounters:  09/04/20 71  06/23/20 89  09/17/19 85   Wt Readings from Last 3 Encounters:  09/04/20 185 lb (83.9 kg)  06/23/20 185 lb (83.9 kg)  09/17/19 182 lb 5 oz (82.7 kg)   BMI Readings from Last 3 Encounters:  09/04/20 29.86 kg/m  06/23/20 29.86 kg/m  09/17/19 29.43 kg/m    Assessment/Interventions: Review of patient past medical history, allergies, medications, health status, including review of consultants reports, laboratory and other test data, was performed as part of comprehensive evaluation and provision of chronic care management services.   SDOH:  (Social Determinants of Health) assessments and interventions performed: Yes SDOH Interventions   Flowsheet Row Most Recent Value  SDOH Interventions   Financial Strain Interventions Intervention Not Indicated     SDOH Screenings   Alcohol Screen: Low Risk   . Last Alcohol Screening Score (AUDIT): 0  Depression (PHQ2-9): Low Risk   . PHQ-2 Score: 0  Financial Resource Strain: Low Risk   . Difficulty of Paying Living Expenses: Not very hard  Food Insecurity: No Food Insecurity  .  Worried About Charity fundraiser in the Last Year: Never true  . Ran Out of Food in the Last Year: Never true  Housing: Low Risk   . Last Housing Risk Score: 0  Physical Activity: Insufficiently Active  . Days of Exercise per Week: 7 days  . Minutes of Exercise per Session: 10 min  Social Connections: Not on file  Stress: No Stress Concern Present  . Feeling of Stress : Not at all  Tobacco Use: Medium Risk  . Smoking Tobacco Use: Former Smoker  . Smokeless Tobacco Use: Former Soil scientist Needs: No Transportation Needs  . Lack  of Transportation (Medical): No  . Lack of Transportation (Non-Medical): No    CCM Care Plan  Allergies  Allergen Reactions  . Protamine Rash    Hypotension, Rash, unstable vitals.    . Statins Other (See Comments)    Caused soreness (Lipitor, Zocor)  . Adhesive [Tape] Other (See Comments)  . Rosuvastatin Calcium Other (See Comments)    Caused soreness (Lipitor, Zocor)  . Imodium [Loperamide] Itching and Rash    Medications Reviewed Today    Reviewed by Debbora Dus, [facility] (Pharmacist) on 11/03/20 at 1134  Med List Status: <None>  Medication Order Taking? Sig Documenting Provider Last Dose Status Informant  acetaminophen (TYLENOL) 500 MG tablet [id number] Yes Take 500-1,000 mg by mouth 2 (two) times daily as needed (for pain).  [provider] Taking Active Multiple Informants  amLODipine (NORVASC) 5 MG tablet [id number] No Take 1 tablet by mouth once daily  Patient not taking: Reported on 11/03/2020   Burnell Blanks, MD Not Taking Active   cholecalciferol (VITAMIN D3) 25 MCG (1000 UNIT) tablet [id number] Yes Take 1,000 Units by mouth daily. [provider] Taking Active Self  clopidogrel (PLAVIX) 75 MG tablet [id number] Yes Take 1 tablet by mouth once daily Burnell Blanks, MD Taking Active   isosorbide mononitrate (IMDUR) 30 MG 24 hr tablet [id number] Yes Take 1 tablet by mouth once daily Burnell Blanks, MD Taking Active   lisinopril (ZESTRIL) 40 MG tablet [id number] Yes Take 1 tablet by mouth once daily Burnell Blanks, MD Taking Active   Multiple Vitamins-Minerals (ONE-A-DAY MENS HEALTH FORMULA PO) [id number] No Take by mouth.  Patient not taking: Reported on 11/03/2020   [provider] Not Taking Active   nitroGLYCERIN (NITROSTAT) 0.4 MG SL tablet [id number] Yes Place 1 tablet (0.4 mg total) under the tongue every 5 (five) minutes as needed for chest pain. Jinny Sanders, MD Taking Active   pantoprazole (PROTONIX) 40 MG tablet [id number] Yes Take 1 tablet by mouth once daily Bedsole, Amy E, MD Taking Active   rosuvastatin (CRESTOR) 5 MG tablet [id number] Yes Take 1 tablet (5 mg total) by mouth daily. Jinny Sanders, MD Taking Active   vitamin B-12 (CYANOCOBALAMIN) 1000 MCG tablet [id number] Yes Take 1,000 mcg by mouth daily. [provider] Taking Active           Patient Active Problem List   Diagnosis Date Noted  . Fatigue 06/23/2020  . Left buttock pain 06/23/2020  . Right carpal tunnel syndrome 03/16/2018  . Hyperlipidemia associated with type 2 diabetes mellitus ([place]) 02/07/2018  . DM type 2 with diabetic peripheral neuropathy ([place]) 03/10/2017  . Neuropathy due to type 2 diabetes mellitus ([place]) 03/10/2017  . Hypertension associated with diabetes ([place]) 04/20/2016  . S/P total knee replacement 04/11/2016  . Coronary artery disease involving native coronary artery of native heart without angina pectoris   . S/P CABG x 4 11/03/2014  . PERCUTANEOUS TRANSLUMINAL CORONARY ANGIOPLASTY, HX OF 11/26/2007    Immunization History  Administered Date(s) Administered  . Fluad Quad(high Dose 65+) 03/22/2019  . PFIZER(Purple Top)SARS-COV-2 Vaccination 09/08/2019, 10/08/2019  . Pneumococcal Conjugate-13 01/21/2016  . Pneumococcal Polysaccharide-23 06/19/2013    Conditions to be addressed/monitored:  Hypertension, Hyperlipidemia and Diabetes  Care Plan : [place]  Updates made by Debbora Dus, [place] since 11/03/2020 12:00 AM    Problem: CHL AMB "PATIENT-SPECIFIC PROBLEM"     Long-Range Goal: Disease Management   Start Date: 11/03/2020  Priority: High  Note:   Current Barriers:  . Home blood pressure elevated  Pharmacist Clinical Goal(s):  Marland Kitchen Patient will achieve adherence to monitoring guidelines and medication adherence to achieve therapeutic efficacy through collaboration with PharmD and provider.   Interventions: . 1:1 collaboration with Jinny Sanders, MD regarding development and update of comprehensive plan of care as evidenced by provider attestation and co-signature . Inter-disciplinary care team collaboration (see longitudinal plan of care) . Comprehensive medication review performed; medication list updated in electronic medical record  Hypertension (BP goal <140/90) -Not ideally controlled - Clinic readings within goal, but home readings elevated -Current treatment: . Amlodipine 5 mg - 1 tablet daily (not taking - patient does not recall last dose) . Lisinopril 40 mg - 1 tablet daily (bedtime) -Medications previously tried: metoprolol - pt reports stopped due to low heart rate  -Using automatic arm cuff for home checks -States his BP is fluctuating a lot -Current home readings: 195/102, recheck third time 156/76 (last week, in the morning) -Reports doctor wants BP about 140/90 -Current dietary habits: none specific, limits sweets -Current exercise habits: gardening, no formal exercise  -Reports hypotensive (dizziness, imbalance)/ denies hypertensive symptoms -We discussed dizziness/imbalance, no hypotension. I do not believe this is related to his BP medications.  -Reports he has chest pain at times and sob with certain activities. None out of the normal. -Educated on BP goals and benefits of medications for prevention of heart attack, stroke and kidney damage; -Counseled to monitor BP at home daily for 5 days,  document, and provide log at future appointments -Recommended to continue current medication; Patient called pharmacy to refill amlodipine. Realized he has some at home just hadn't been taking. Resume amlodipine 5 mg daily per medication list.  Coronary Artery Disease (Control symptoms/prevention) -Controlled - patient reports no abnormal chest pain or cardiac symptoms  Current treatment:  . Imdur 30 mg - 1 tablet daily (bedtime) . Nitroglycerin 0.4 mg SL as needed (none needed recently) . Plavix 75 mg - 1 tablet daily (morning) -Medications previously tried: aspirin -Recommend continue current medications  Hyperlipidemia: (LDL goal < 70) -Controlled - LDL 69 -Current treatment: . Rosuvastatin 5 mg - 1 tablet daily (bedtime) -Medications previously tried: none  -He has this on hand. Refill history behind as it was stopped short term due to fatigue, joint pain. This did not resolve off medication. Restarted at last PCP visit 06/2020. -Educated on Cholesterol goals;  -Recommended to continue current medication  Diabetes (A1c goal <7%) -Controlled - A1c 6.7% -Current medications: . None -Medications previously tried: none  -Current home glucose readings - not checking routinely . fasting glucose: 100-110 (before lunch) . post prandial glucose: n/a -Denies hypoglycemic/hyperglycemic symptoms -Current meal patterns: limit sweets; biggest problem is bread and potatoes -Educated on A1c and blood sugar goals; -Counseled to check feet daily and get yearly eye exams - patient reports annual eye exam completed 2021, foot exam up to date -Recommended to continue current medication  Patient Goals/Self-Care Activities . Patient will:  - focus on medication adherence by using pillbox, comparing to med list  Follow Up Plan: Telephone follow up appointment with care management team member scheduled for: 6 months for pharmacist review, 2 weeks blood pressure log     Medication Assistance:  None required.  Patient affirms current coverage meets needs.  No indication on diagnosis list Pantoprazole - 1 every morning  OTCs:  Tylenol 500 mg - 3 tabs twice a day Vitamin D - 1 daily (no dose reported)  Vitamin B12 - 1 daily (no dose reported) Denies NSAIDs  Star Rating Drugs: Lisinopril 40 mg           10/16/20  90 DS Rosuvastatin 5 mg      03/19/20  90 DS  Patient's preferred pharmacy is:  [facility] [address] [place] - [place] [address] Phone: [phone] Fax: [phone]  Uses pill box? Yes - 1 for morning and 1 for bedtime  Pt endorses 95% compliance - wife puts them on table for him, occasionally forgets and usually takes a few hours later  We discussed: Benefits of medication synchronization, packaging and delivery as well as enhanced pharmacist oversight with [employer]. Patient decided to: Continue current medication management strategy  Care Plan and Follow Up Patient Decision:  Patient agrees to Care Plan and Follow-up.  Debbora Dus, PharmD Clinical Pharmacist [facility] at [facility] [phone]

## 2020-11-17 ENCOUNTER — Telehealth: Payer: Self-pay

## 2020-11-17 NOTE — Chronic Care Management (AMB) (Addendum)
Chronic Care Management Pharmacy Assistant   Name: [name]  MRN: [id number] DOB: 1935/02/02  Reason for Encounter: Disease State    Conditions to be addressed/monitored: HTN  Recent office visits:  Last CCM visit - take amlodipine daily as prescribed for BP  Recent consult visits:  None since last CCM contact  Hospital visits:  None in previous 6 months  Medications: Outpatient Encounter Medications as of 11/17/2020  Medication Sig   acetaminophen (TYLENOL) 500 MG tablet Take 500-1,000 mg by mouth 2 (two) times daily as needed (for pain).    amLODipine (NORVASC) 5 MG tablet Take 1 tablet by mouth once daily (Patient not taking: Reported on 11/03/2020)   cholecalciferol (VITAMIN D3) 25 MCG (1000 UNIT) tablet Take 1,000 Units by mouth daily.   clopidogrel (PLAVIX) 75 MG tablet Take 1 tablet by mouth once daily   isosorbide mononitrate (IMDUR) 30 MG 24 hr tablet Take 1 tablet by mouth once daily   lisinopril (ZESTRIL) 40 MG tablet Take 1 tablet by mouth once daily   Multiple Vitamins-Minerals (ONE-A-DAY MENS HEALTH FORMULA PO) Take by mouth. (Patient not taking: Reported on 11/03/2020)   nitroGLYCERIN (NITROSTAT) 0.4 MG SL tablet Place 1 tablet (0.4 mg total) under the tongue every 5 (five) minutes as needed for chest pain.   pantoprazole (PROTONIX) 40 MG tablet Take 1 tablet by mouth once daily   rosuvastatin (CRESTOR) 5 MG tablet Take 1 tablet (5 mg total) by mouth daily.   vitamin B-12 (CYANOCOBALAMIN) 1000 MCG tablet Take 1,000 mcg by mouth daily.   No facility-administered encounter medications on file as of 11/17/2020.    Recent Office Vitals: BP Readings from Last 3 Encounters:  09/04/20 140/60  06/23/20 118/68  09/17/19 130/78   Pulse Readings from Last 3 Encounters:  09/04/20 71  06/23/20 89  09/17/19 85    Wt Readings from Last 3 Encounters:  09/04/20 185 lb (83.9 kg)  06/23/20 185 lb (83.9 kg)  09/17/19 182 lb 5 oz (82.7 kg)     Kidney Function Lab  Results  Component Value Date/Time   CREATININE 0.86 06/15/2020 09:38 AM   CREATININE 0.84 03/13/2019 09:02 AM   CREATININE 0.86 06/28/2016 09:05 AM   GFR 80.09 06/15/2020 09:38 AM   GFRNONAA 80 09/24/2018 11:48 AM   GFRAA 92 09/24/2018 11:48 AM    BMP Latest Ref Rng & Units 06/15/2020 03/13/2019 09/24/2018  Glucose 70 - 99 mg/dL 124(H) 118(H) 123(H)  BUN 6 - 23 mg/dL 15 19 15   Creatinine 0.40 - 1.50 mg/dL 0.86 0.84 0.90  BUN/Creat Ratio 10 - 24 - - 17  Sodium 135 - 145 mEq/L 142 142 144  Potassium 3.5 - 5.1 mEq/L 3.9 3.6 3.9  Chloride 96 - 112 mEq/L 104 108 106  CO2 19 - 32 mEq/L 29 27 21   Calcium 8.4 - 10.5 mg/dL 9.5 8.9 8.9   Attempted to reach patient by telephone for CCM hypertension review. Unsuccessful outreach. Chart reviewed.  Current antihypertensive regimen:  Amlodipine 5 mg - 1 tablet daily Lisinopril 40 mg - 1 tablet daily   Adherence Review: Is the patient currently on ACE/ARB medication? Yes Does the patient have >5 day gap between last estimated fill dates?  Yes - statin  Star Rating Drugs:  Medication:  Last Fill: Day Supply Lisinopril 40mg  10/16/2020 90ds Rosuvastatin 5mg  03/19/2020 90ds  Follow-Up:  Pharmacist Review  Debbora Dus, CPP notified  Avel Sensor, [place] Assistant [phone]  I have reviewed the care  management and care coordination activities outlined in this encounter and I am certifying that I agree with the content of this note. Will try to connect again next month.  Debbora Dus, PharmD Clinical Pharmacist [facility] at [facility] [phone]

## 2020-12-04 ENCOUNTER — Other Ambulatory Visit: Payer: Self-pay | Admitting: Cardiovascular Disease

## 2020-12-23 ENCOUNTER — Telehealth: Payer: Self-pay

## 2020-12-23 NOTE — Chronic Care Management (AMB) (Addendum)
Chronic Care Management Pharmacy Assistant   Name: [name]  MRN: [id number] DOB: Feb 04, 1935  Reason for Encounter: Disease State HTN    Recent office visits:  None since last CCM contact  Recent consult visits:  None since last CCM contact  Hospital visits:  None in previous 6 months  Medications: Outpatient Encounter Medications as of 12/23/2020  Medication Sig   acetaminophen (TYLENOL) 500 MG tablet Take 500-1,000 mg by mouth 2 (two) times daily as needed (for pain).    amLODipine (NORVASC) 5 MG tablet Take 1 tablet by mouth once daily (Patient not taking: Reported on 11/03/2020)   cholecalciferol (VITAMIN D3) 25 MCG (1000 UNIT) tablet Take 1,000 Units by mouth daily.   clopidogrel (PLAVIX) 75 MG tablet Take 1 tablet by mouth once daily   isosorbide mononitrate (IMDUR) 30 MG 24 hr tablet Take 1 tablet by mouth once daily   lisinopril (ZESTRIL) 40 MG tablet Take 1 tablet by mouth once daily   Multiple Vitamins-Minerals (ONE-A-DAY MENS HEALTH FORMULA PO) Take by mouth. (Patient not taking: Reported on 11/03/2020)   nitroGLYCERIN (NITROSTAT) 0.4 MG SL tablet Place 1 tablet (0.4 mg total) under the tongue every 5 (five) minutes as needed for chest pain.   pantoprazole (PROTONIX) 40 MG tablet Take 1 tablet by mouth once daily   rosuvastatin (CRESTOR) 5 MG tablet Take 1 tablet (5 mg total) by mouth daily.   vitamin B-12 (CYANOCOBALAMIN) 1000 MCG tablet Take 1,000 mcg by mouth daily.   No facility-administered encounter medications on file as of 12/23/2020.    Recent Office Vitals: BP Readings from Last 3 Encounters:  09/04/20 140/60  06/23/20 118/68  09/17/19 130/78   Pulse Readings from Last 3 Encounters:  09/04/20 71  06/23/20 89  09/17/19 85    Wt Readings from Last 3 Encounters:  09/04/20 185 lb (83.9 kg)  06/23/20 185 lb (83.9 kg)  09/17/19 182 lb 5 oz (82.7 kg)     Kidney Function Lab Results  Component Value Date/Time   CREATININE 0.86 06/15/2020 09:38  AM   CREATININE 0.84 03/13/2019 09:02 AM   CREATININE 0.86 06/28/2016 09:05 AM   GFR 80.09 06/15/2020 09:38 AM   GFRNONAA 80 09/24/2018 11:48 AM   GFRAA 92 09/24/2018 11:48 AM    BMP Latest Ref Rng & Units 06/15/2020 03/13/2019 09/24/2018  Glucose 70 - 99 mg/dL 124(H) 118(H) 123(H)  BUN 6 - 23 mg/dL 15 19 15   Creatinine 0.40 - 1.50 mg/dL 0.86 0.84 0.90  BUN/Creat Ratio 10 - 24 - - 17  Sodium 135 - 145 mEq/L 142 142 144  Potassium 3.5 - 5.1 mEq/L 3.9 3.6 3.9  Chloride 96 - 112 mEq/L 104 108 106  CO2 19 - 32 mEq/L 29 27 21   Calcium 8.4 - 10.5 mg/dL 9.5 8.9 8.9    Current antihypertensive regimen:  Amlodipine 5 mg - 1 tablet daily (morning) Lisinopril 40 mg - 1 tablet daily (bedtime)   Patient verbally confirms he is taking the above medications as directed. Yes the patient confirmed both medications and dose  How often are you checking your Blood Pressure?  The patient reports he just moved his residence several weeks ago and has not resumed taking his BP. He will start taking and recording the numbers  Wrist or arm cuff: arm cuff  Caffeine intake: drinks 1 cup of regular coffee in the am  Salt intake: uses and limits sea salt OTC medications including pseudoephedrine or NSAIDs?no  What recent interventions/DTPs  have been made by any provider to improve Blood Pressure control since last CPP Visit: none identified  Any recent hospitalizations or ED visits since last visit with CPP? No  What diet changes have been made to improve Blood Pressure Control? None identiied  What exercise is being done to improve your Blood Pressure Control? The patient does try to stay active with doing yard work, mow the grass   Adherence Review: Is the patient currently on ACE/ARB medication? Yes Does the patient have >5 day gap between last estimated fill dates? Yes - statin  Star Rating Drugs: Medication:  Last Fill: Day Supply Rosuvastatin 5mg   03/19/20  90 Lisinopril 40mg  10/16/20  90  The  patient confirms he takes rosuvastatin 5mg  every evening. The patient was reminded of follow up appointments.  Follow-Up:  Pharmacist Review  Debbora Dus, CPP notified  Avel Sensor, [place] Assistant [phone]  I have reviewed the care management and care coordination activities outlined in this encounter and I am certifying that I agree with the content of this note. No further action required.  Debbora Dus, PharmD Clinical Pharmacist [facility] at [facility] [phone]

## 2020-12-27 ENCOUNTER — Emergency Department (HOSPITAL_COMMUNITY): Payer: Medicare HMO

## 2020-12-27 ENCOUNTER — Inpatient Hospital Stay (HOSPITAL_COMMUNITY)
Admission: EM | Admit: 2020-12-27 | Discharge: 2021-01-01 | DRG: 871 | Disposition: A | Discharge status: Home Health | Payer: Medicare HMO | Attending: Internal Medicine | Admitting: Internal Medicine

## 2020-12-27 DIAGNOSIS — I5032 Chronic diastolic (congestive) heart failure: Secondary | ICD-10-CM | POA: Diagnosis present

## 2020-12-27 DIAGNOSIS — I110 Hypertensive heart disease with heart failure: Secondary | ICD-10-CM | POA: Diagnosis present

## 2020-12-27 DIAGNOSIS — I452 Bifascicular block: Secondary | ICD-10-CM | POA: Diagnosis present

## 2020-12-27 DIAGNOSIS — J9601 Acute respiratory failure with hypoxia: Secondary | ICD-10-CM

## 2020-12-27 DIAGNOSIS — A403 Sepsis due to Streptococcus pneumoniae: Secondary | ICD-10-CM | POA: Diagnosis not present

## 2020-12-27 DIAGNOSIS — R509 Fever, unspecified: Secondary | ICD-10-CM | POA: Diagnosis not present

## 2020-12-27 DIAGNOSIS — E872 Acidosis: Secondary | ICD-10-CM | POA: Diagnosis present

## 2020-12-27 DIAGNOSIS — R059 Cough, unspecified: Secondary | ICD-10-CM | POA: Diagnosis not present

## 2020-12-27 DIAGNOSIS — E785 Hyperlipidemia, unspecified: Secondary | ICD-10-CM | POA: Diagnosis present

## 2020-12-27 DIAGNOSIS — Z951 Presence of aortocoronary bypass graft: Secondary | ICD-10-CM

## 2020-12-27 DIAGNOSIS — Z8249 Family history of ischemic heart disease and other diseases of the circulatory system: Secondary | ICD-10-CM

## 2020-12-27 DIAGNOSIS — Z87820 Personal history of traumatic brain injury: Secondary | ICD-10-CM

## 2020-12-27 DIAGNOSIS — Z87891 Personal history of nicotine dependence: Secondary | ICD-10-CM

## 2020-12-27 DIAGNOSIS — E1142 Type 2 diabetes mellitus with diabetic polyneuropathy: Secondary | ICD-10-CM | POA: Diagnosis present

## 2020-12-27 DIAGNOSIS — Y92239 Unspecified place in hospital as the place of occurrence of the external cause: Secondary | ICD-10-CM | POA: Diagnosis not present

## 2020-12-27 DIAGNOSIS — I2510 Atherosclerotic heart disease of native coronary artery without angina pectoris: Secondary | ICD-10-CM | POA: Diagnosis present

## 2020-12-27 DIAGNOSIS — Z825 Family history of asthma and other chronic lower respiratory diseases: Secondary | ICD-10-CM

## 2020-12-27 DIAGNOSIS — K219 Gastro-esophageal reflux disease without esophagitis: Secondary | ICD-10-CM | POA: Diagnosis present

## 2020-12-27 DIAGNOSIS — I480 Paroxysmal atrial fibrillation: Secondary | ICD-10-CM | POA: Diagnosis present

## 2020-12-27 DIAGNOSIS — K449 Diaphragmatic hernia without obstruction or gangrene: Secondary | ICD-10-CM | POA: Diagnosis not present

## 2020-12-27 DIAGNOSIS — Z7902 Long term (current) use of antithrombotics/antiplatelets: Secondary | ICD-10-CM

## 2020-12-27 DIAGNOSIS — Z79899 Other long term (current) drug therapy: Secondary | ICD-10-CM

## 2020-12-27 DIAGNOSIS — E871 Hypo-osmolality and hyponatremia: Secondary | ICD-10-CM | POA: Diagnosis present

## 2020-12-27 DIAGNOSIS — U071 COVID-19: Secondary | ICD-10-CM | POA: Diagnosis present

## 2020-12-27 DIAGNOSIS — Z96653 Presence of artificial knee joint, bilateral: Secondary | ICD-10-CM | POA: Diagnosis present

## 2020-12-27 DIAGNOSIS — R6520 Severe sepsis without septic shock: Secondary | ICD-10-CM | POA: Diagnosis present

## 2020-12-27 DIAGNOSIS — R0689 Other abnormalities of breathing: Secondary | ICD-10-CM | POA: Diagnosis not present

## 2020-12-27 DIAGNOSIS — J1282 Pneumonia due to coronavirus disease 2019: Secondary | ICD-10-CM | POA: Diagnosis present

## 2020-12-27 DIAGNOSIS — E1165 Type 2 diabetes mellitus with hyperglycemia: Secondary | ICD-10-CM | POA: Diagnosis not present

## 2020-12-27 DIAGNOSIS — H9190 Unspecified hearing loss, unspecified ear: Secondary | ICD-10-CM | POA: Diagnosis present

## 2020-12-27 DIAGNOSIS — Z743 Need for continuous supervision: Secondary | ICD-10-CM | POA: Diagnosis not present

## 2020-12-27 DIAGNOSIS — R0602 Shortness of breath: Secondary | ICD-10-CM | POA: Diagnosis not present

## 2020-12-27 DIAGNOSIS — T380X5A Adverse effect of glucocorticoids and synthetic analogues, initial encounter: Secondary | ICD-10-CM | POA: Diagnosis not present

## 2020-12-27 DIAGNOSIS — E876 Hypokalemia: Secondary | ICD-10-CM | POA: Diagnosis present

## 2020-12-27 DIAGNOSIS — R7881 Bacteremia: Secondary | ICD-10-CM

## 2020-12-27 DIAGNOSIS — Z888 Allergy status to other drugs, medicaments and biological substances status: Secondary | ICD-10-CM

## 2020-12-27 DIAGNOSIS — J189 Pneumonia, unspecified organism: Secondary | ICD-10-CM

## 2020-12-27 DIAGNOSIS — J13 Pneumonia due to Streptococcus pneumoniae: Secondary | ICD-10-CM | POA: Diagnosis present

## 2020-12-27 DIAGNOSIS — R0902 Hypoxemia: Secondary | ICD-10-CM | POA: Diagnosis not present

## 2020-12-27 DIAGNOSIS — B955 Unspecified streptococcus as the cause of diseases classified elsewhere: Secondary | ICD-10-CM

## 2020-12-27 LAB — COMPREHENSIVE METABOLIC PANEL
ALT: 19 U/L (ref 0–44)
AST: 25 U/L (ref 15–41)
Albumin: 3 g/dL — ABNORMAL LOW (ref 3.5–5.0)
Alkaline Phosphatase: 37 U/L — ABNORMAL LOW (ref 38–126)
Anion gap: 11 (ref 5–15)
BUN: 26 mg/dL — ABNORMAL HIGH (ref 8–23)
CO2: 20 mmol/L — ABNORMAL LOW (ref 22–32)
Calcium: 8.7 mg/dL — ABNORMAL LOW (ref 8.9–10.3)
Chloride: 103 mmol/L (ref 98–111)
Creatinine, Ser: 1 mg/dL (ref 0.61–1.24)
GFR, Estimated: >60 mL/min (ref >60)
Glucose, Bld: 151 mg/dL — ABNORMAL HIGH (ref 70–99)
Potassium: 3.4 mmol/L — ABNORMAL LOW (ref 3.5–5.1)
Sodium: 134 mmol/L — ABNORMAL LOW (ref 135–145)
Total Bilirubin: 1.3 mg/dL — ABNORMAL HIGH (ref 0.3–1.2)
Total Protein: 6.3 g/dL — ABNORMAL LOW (ref 6.5–8.1)

## 2020-12-27 LAB — LACTATE DEHYDROGENASE: LDH: 178 U/L (ref 98–192)

## 2020-12-27 LAB — C-REACTIVE PROTEIN: CRP: 41.5 mg/dL — ABNORMAL HIGH (ref <1.0)

## 2020-12-27 LAB — LACTIC ACID, PLASMA: Lactic Acid, Venous: 4.3 mmol/L (ref 0.5–1.9)

## 2020-12-27 LAB — D-DIMER, QUANTITATIVE: D-Dimer, Quant: 6.05 ug/mL-FEU — ABNORMAL HIGH (ref 0.00–0.50)

## 2020-12-27 LAB — TRIGLYCERIDES: Triglycerides: 111 mg/dL (ref <150)

## 2020-12-27 LAB — FERRITIN: Ferritin: 339 ng/mL — ABNORMAL HIGH (ref 24–336)

## 2020-12-27 LAB — FIBRINOGEN: Fibrinogen: >800 mg/dL — ABNORMAL HIGH (ref 210–475)

## 2020-12-27 MED ORDER — ONDANSETRON HCL 4 MG/2ML IJ SOLN
4.0000 mg | Freq: Once | INTRAMUSCULAR | Status: AC
  Administered 2020-12-27: 4 mg via INTRAVENOUS
  Filled 2020-12-27: qty 2

## 2020-12-27 MED ORDER — SODIUM CHLORIDE 0.9 % IV SOLN
1.0000 g | Freq: Once | INTRAVENOUS | Status: AC
  Administered 2020-12-27: 1 g via INTRAVENOUS
  Filled 2020-12-27: qty 10

## 2020-12-27 MED ORDER — SODIUM CHLORIDE 0.9 % IV BOLUS
1000.0000 mL | Freq: Once | INTRAVENOUS | Status: AC
  Administered 2020-12-27: 1000 mL via INTRAVENOUS

## 2020-12-27 MED ORDER — SODIUM CHLORIDE 0.9 % IV SOLN
500.0000 mg | Freq: Once | INTRAVENOUS | Status: AC
  Administered 2020-12-27: 500 mg via INTRAVENOUS
  Filled 2020-12-27: qty 500

## 2020-12-27 MED ORDER — DEXAMETHASONE SODIUM PHOSPHATE 10 MG/ML IJ SOLN
6.0000 mg | Freq: Once | INTRAMUSCULAR | Status: AC
  Administered 2020-12-27: 6 mg via INTRAVENOUS
  Filled 2020-12-27: qty 1

## 2020-12-27 NOTE — ED Triage Notes (Signed)
Pt bib gems c/o generalized fatigue, N/V, and loss of appetite. Pt self tested postitive for covid 6/10. Pt took tylenol at home around 1600, unknown dose. A&Ox4. 400 ml NS given PTA.   BP: 120/76  HR: 110  RR: 35  Spo2: 96% 6L  CBG: 170

## 2020-12-27 NOTE — ED Provider Notes (Signed)
[facility] EMERGENCY DEPARTMENT Provider Note   CSN: [id number] Arrival date & time: 12/27/20  2102     History Chief Complaint  Patient presents with   Fatigue   Covid Positive    [name] is a 86 y.o. male.  HPI 86 year old male presents with generalized weakness and nausea.  Patient has had COVID since 6/10.  He was getting better but starting 2 days ago he has started to get worse.  He is having nausea, decreased appetite, generalized weakness.  He states he is not short of breath but he was hypoxic and is currently on 6 L.  He does not normally wear oxygen.  He had 2 COVID vaccines.  He denies headache or chest pain.  Most recently had a fever either today or yesterday, he is not sure.  Past Medical History:  Diagnosis Date   CAD (coronary artery disease)    s/p stent mid LAD 2005 // s/p CABG 2016 // Myoview 8/19:  EF 64, no ischemia   Coronary artery disease involving native coronary artery of native heart without angina pectoris    Depression    DJD (degenerative joint disease)    DM type 2 with diabetic peripheral neuropathy ([place]) 03/10/2017   E coli bacteremia 04/20/2016   Essential hypertension 04/20/2016   GERD (gastroesophageal reflux disease)    History of echocardiogram    Echo 8/19: Moderate LVH, EF 60-65, normal wall motion, grade 1 diastolic dysfunction, mild MR, mild LAE, normal RVSF, mild TR, PASP 25   History of hiatal hernia    HTN (hypertension)    Hyperlipidemia    Neuropathy due to type 2 diabetes mellitus ([place]) 03/10/2017   PERCUTANEOUS TRANSLUMINAL CORONARY ANGIOPLASTY, HX OF 11/26/2007   Annotation: With CYPHER STENT. Qualifier: Diagnosis of  By: Danny Lawless CMA, [place]     Pre-diabetes    S/P CABG x 4 11/03/2014   S/P total knee replacement 04/11/2016   Sepsis due to Escherichia coli (E. coli) ([place]) 04/20/2016   Tinea pedis 09/08/2017    Patient Active Problem List   Diagnosis Date Noted   Fatigue 06/23/2020   Left buttock pain  06/23/2020   Right carpal tunnel syndrome 03/16/2018   Hyperlipidemia associated with type 2 diabetes mellitus ([place]) 02/07/2018   DM type 2 with diabetic peripheral neuropathy ([place]) 03/10/2017   Neuropathy due to type 2 diabetes mellitus ([place]) 03/10/2017   Hypertension associated with diabetes ([place]) 04/20/2016   S/P total knee replacement 04/11/2016   Coronary artery disease involving native coronary artery of native heart without angina pectoris    S/P CABG x 4 11/03/2014   PERCUTANEOUS TRANSLUMINAL CORONARY ANGIOPLASTY, HX OF 11/26/2007    Past Surgical History:  Procedure Laterality Date   BRAIN SURGERY     CARDIAC CATHETERIZATION     CORONARY ARTERY BYPASS GRAFT N/A 11/03/2014   Procedure: CORONARY ARTERY BYPASS GRAFTING (CABG), ON PUMP, TIMES FOUR, USING LEFT INTERNAL MAMMARY, RIGHT GREATER SAPHENOUS VEIN HARVESTED ENDOSCOPICALLY;  Surgeon: Ivin Poot, MD;  Location: [place];  Service: Open Heart Surgery;  Laterality: N/A;  LIMA-LAD; SVG-DIAG; SVG-OM; SVG-RCA   CRANIOTOMY N/A 01/18/2014   Procedure: CRANIOTOMY HEMATOMA EVACUATION SUBDURAL;  Surgeon: Hosie Spangle, MD;  Location: [place];  Service: Neurosurgery;  Laterality: N/A;   kidney stone removal     KNEE SURGERY     LEFT HEART CATHETERIZATION WITH CORONARY ANGIOGRAM N/A 07/27/2011   Procedure: LEFT HEART CATHETERIZATION WITH CORONARY ANGIOGRAM;  Surgeon: Burnell Blanks, MD;  Location: [place] CATH LAB;  Service: Cardiovascular;  Laterality: N/A;   LEFT HEART CATHETERIZATION WITH CORONARY ANGIOGRAM N/A 10/31/2014   Procedure: LEFT HEART CATHETERIZATION WITH CORONARY ANGIOGRAM;  Surgeon: Sherren Mocha, MD;  Location: [facility] CATH LAB;  Service: Cardiovascular;  Laterality: N/A;   PTCA     Hx of it.    SIGMOIDOSCOPY     TEE WITHOUT CARDIOVERSION N/A 11/03/2014   Procedure: TRANSESOPHAGEAL ECHOCARDIOGRAM (TEE);  Surgeon: Ivin Poot, MD;  Location: [place];  Service: Open Heart Surgery;  Laterality: N/A;   TOTAL KNEE  ARTHROPLASTY  02/2011   Right knee   TOTAL KNEE ARTHROPLASTY Left 04/11/2016   Procedure: LEFT TOTAL KNEE ARTHROPLASTY;  Surgeon: Vickey Huger, MD;  Location: [place];  Service: Orthopedics;  Laterality: Left;       Family History  Problem Relation Age of Onset   Heart disease Father    Heart attack Father    Heart failure Mother    COPD Mother    Heart disease Sister    Heart attack Sister    Heart attack Brother     Social History   Tobacco Use   Smoking status: Former    Pack years: 0.00   Smokeless tobacco: Former   Tobacco comments:    quit in 1985. smoked 1ppd for 35 years   Vaping Use   Vaping Use: Never used  Substance Use Topics   Alcohol use: No   Drug use: No    Home Medications Prior to Admission medications   Medication Sig Start Date End Date Taking? Authorizing Provider  acetaminophen (TYLENOL) 500 MG tablet Take 500-1,000 mg by mouth 2 (two) times daily as needed (for pain).     [provider]  amLODipine (NORVASC) 5 MG tablet Take 1 tablet by mouth once daily Patient not taking: Reported on 11/03/2020 09/08/20   Burnell Blanks, MD  cholecalciferol (VITAMIN D3) 25 MCG (1000 UNIT) tablet Take 1,000 Units by mouth daily.    [provider]  clopidogrel (PLAVIX) 75 MG tablet Take 1 tablet by mouth once daily 09/08/20   Burnell Blanks, MD  isosorbide mononitrate (IMDUR) 30 MG 24 hr tablet Take 1 tablet by mouth once daily 12/04/20   Burnell Blanks, MD  lisinopril (ZESTRIL) 40 MG tablet Take 1 tablet by mouth once daily 10/16/20   Burnell Blanks, MD  Multiple Vitamins-Minerals (ONE-A-DAY MENS HEALTH FORMULA PO) Take by mouth. Patient not taking: Reported on 11/03/2020    [provider]  nitroGLYCERIN (NITROSTAT) 0.4 MG SL tablet Place 1 tablet (0.4 mg total) under the tongue every 5 (five) minutes as needed for chest pain. 09/17/19   Bedsole, Amy E, MD  pantoprazole (PROTONIX) 40 MG tablet Take 1 tablet by  mouth once daily 07/13/20   Bedsole, Amy E, MD  rosuvastatin (CRESTOR) 5 MG tablet Take 1 tablet (5 mg total) by mouth daily. 09/17/19   Bedsole, Amy E, MD  vitamin B-12 (CYANOCOBALAMIN) 1000 MCG tablet Take 1,000 mcg by mouth daily.    [provider]    Allergies    Protamine, Statins, Adhesive [tape], Rosuvastatin calcium, and Imodium [loperamide]  Review of Systems   Review of Systems  Constitutional:  Positive for fever.  Respiratory:  Positive for cough and shortness of breath.   Cardiovascular:  Negative for chest pain.  Gastrointestinal:  Positive for nausea and vomiting. Negative for abdominal pain.  Neurological:  Positive for weakness.  All other systems reviewed and are negative.  Physical Exam Updated Vital Signs BP (!) 159/81   Pulse (!) 106   Temp 98.4 F (36.9 C) (Oral)   Resp (!) 25   Ht 5\' 7"  (1.702 m)   Wt 79.4 kg   SpO2 (!) 89%   BMI 27.41 kg/m   Physical Exam Vitals and nursing note reviewed.  Constitutional:      Appearance: He is well-developed.  HENT:     Head: Normocephalic and atraumatic.     Right Ear: External ear normal.     Left Ear: External ear normal.     Nose: Nose normal.  Eyes:     General:        Right eye: No discharge.        Left eye: No discharge.  Cardiovascular:     Rate and Rhythm: Normal rate and regular rhythm.     Heart sounds: Normal heart sounds.  Pulmonary:     Effort: Pulmonary effort is normal. Tachypnea present. No accessory muscle usage.     Breath sounds: Rales (bilateral, basilar) present.  Abdominal:     General: There is no distension.     Palpations: Abdomen is soft.     Tenderness: There is no abdominal tenderness.  Musculoskeletal:     Cervical back: Neck supple.  Skin:    General: Skin is warm and dry.  Neurological:     Mental Status: He is alert.     Comments: 5/5 strength in all 4 extremities  Psychiatric:        Mood and Affect: Mood is not anxious.    ED Results / Procedures /  Treatments   Labs (all labs ordered are listed, but only abnormal results are displayed) Labs Reviewed  RESP PANEL BY RT-PCR (FLU A&B, COVID) ARPGX2 - Abnormal; Notable for the following components:      Result Value   SARS Coronavirus 2 by RT PCR POSITIVE (*)    All other components within normal limits  LACTIC ACID, PLASMA - Abnormal; Notable for the following components:   Lactic Acid, Venous 4.3 (*)    All other components within normal limits  COMPREHENSIVE METABOLIC PANEL - Abnormal; Notable for the following components:   Sodium 134 (*)    Potassium 3.4 (*)    CO2 20 (*)    Glucose, Bld 151 (*)    BUN 26 (*)    Calcium 8.7 (*)    Total Protein 6.3 (*)    Albumin 3.0 (*)    Alkaline Phosphatase 37 (*)    Total Bilirubin 1.3 (*)    All other components within normal limits  D-DIMER, QUANTITATIVE - Abnormal; Notable for the following components:   D-Dimer, Quant 6.05 (*)    All other components within normal limits  FERRITIN - Abnormal; Notable for the following components:   Ferritin 339 (*)    All other components within normal limits  FIBRINOGEN - Abnormal; Notable for the following components:   Fibrinogen >800 (*)    All other components within normal limits  C-REACTIVE PROTEIN - Abnormal; Notable for the following components:   CRP 41.5 (*)    All other components within normal limits  CULTURE, BLOOD (ROUTINE X 2)  CULTURE, BLOOD (ROUTINE X 2)  CBC WITH DIFFERENTIAL/PLATELET  PROCALCITONIN  LACTATE DEHYDROGENASE  TRIGLYCERIDES  LACTIC ACID, PLASMA    EKG EKG Interpretation  Date/Time:  Sunday December 27 2020 21:18:40 EDT Ventricular Rate:  104 PR Interval:    QRS Duration: 145 QT Interval:  364 QTC Calculation: 479 R Axis:   -38 Text Interpretation: Atrial fibrillation RBBB and LAFB Confirmed by Sherwood Gambler [phone]) on 12/27/2020 9:55:57 PM  Radiology DG Chest Port 1 View  Result Date: 12/27/2020 CLINICAL DATA:  Hypoxia and cough, COVID-19  positivity EXAM: PORTABLE CHEST 1 VIEW COMPARISON:  04/21/2016 FINDINGS: Cardiac shadow is prominent but stable. Large hiatal hernia is again identified. Postsurgical changes are seen. Patchy airspace opacities are noted in the bases bilaterally likely related to the patient's given clinical history. No bony abnormality is noted. IMPRESSION: Patchy bibasilar airspace opacities consistent with the given clinical history. Electronically Signed   By: Inez Catalina M.D.   On: 12/27/2020 22:45    Procedures .Critical Care  Date/Time: 12/28/2020 12:10 AM Performed by: Sherwood Gambler, MD Authorized by: Sherwood Gambler, MD   Critical care provider statement:    Critical care time (minutes):  30   Critical care time was exclusive of:  Separately billable procedures and treating other patients   Critical care was necessary to treat or prevent imminent or life-threatening deterioration of the following conditions:  Respiratory failure and sepsis   Critical care was time spent personally by me on the following activities:  Discussions with consultants, evaluation of patient's response to treatment, examination of patient, ordering and performing treatments and interventions, ordering and review of laboratory studies, ordering and review of radiographic studies, pulse oximetry, re-evaluation of patient's condition, obtaining history from patient or surrogate and review of old charts   Medications Ordered in ED Medications  sodium chloride 0.9 % bolus 1,000 mL (0 mLs Intravenous Stopped 12/27/20 2348)  ondansetron (ZOFRAN) injection 4 mg (4 mg Intravenous Given 12/27/20 2218)  cefTRIAXone (ROCEPHIN) 1 g in sodium chloride 0.9 % 100 mL IVPB (0 g Intravenous Stopped 12/27/20 2348)  azithromycin (ZITHROMAX) 500 mg in sodium chloride 0.9 % 250 mL IVPB (0 mg Intravenous Stopped 12/27/20 2348)  dexamethasone (DECADRON) injection 6 mg (6 mg Intravenous Given 12/27/20 2348)    ED Course  I have reviewed the triage  vital signs and the nursing notes.  Pertinent labs & imaging results that were available during my care of the patient were reviewed by me and considered in my medical decision making (see chart for details).    MDM Rules/Calculators/A&P                          Unclear if patient's worsening is due to COVID itself or pneumonia on top of COVID.  Thus he was treated with both antibiotics and also Decadron.  He is requiring 4-5 L.  He is tachypneic but not in distress.  His lactate is probably more likely due to hypoxia rather than sepsis.  He was given some IV fluids.  He is not hypotensive but is actually hypertensive.  Overall, I have discussed his presentation and admission with Dr. [place] Crane, who will admit. Final Clinical Impression(s) / ED Diagnoses Final diagnoses:  Acute respiratory failure with hypoxia ([place])  Pneumonia due to COVID-19 virus    Rx / DC Orders ED Discharge Orders     None        Sherwood Gambler, MD 12/28/20 [phone]

## 2020-12-27 NOTE — ED Notes (Signed)
MD made aware of critical lactic of 4.3 and BP of 189/78.

## 2020-12-28 ENCOUNTER — Telehealth: Payer: Self-pay

## 2020-12-28 ENCOUNTER — Other Ambulatory Visit: Payer: Self-pay

## 2020-12-28 DIAGNOSIS — J189 Pneumonia, unspecified organism: Secondary | ICD-10-CM | POA: Diagnosis not present

## 2020-12-28 DIAGNOSIS — Y92239 Unspecified place in hospital as the place of occurrence of the external cause: Secondary | ICD-10-CM | POA: Diagnosis not present

## 2020-12-28 DIAGNOSIS — R6520 Severe sepsis without septic shock: Secondary | ICD-10-CM | POA: Diagnosis not present

## 2020-12-28 DIAGNOSIS — R7881 Bacteremia: Secondary | ICD-10-CM | POA: Diagnosis not present

## 2020-12-28 DIAGNOSIS — Z951 Presence of aortocoronary bypass graft: Secondary | ICD-10-CM | POA: Diagnosis not present

## 2020-12-28 DIAGNOSIS — J1282 Pneumonia due to coronavirus disease 2019: Secondary | ICD-10-CM

## 2020-12-28 DIAGNOSIS — I452 Bifascicular block: Secondary | ICD-10-CM | POA: Diagnosis present

## 2020-12-28 DIAGNOSIS — R7989 Other specified abnormal findings of blood chemistry: Secondary | ICD-10-CM | POA: Diagnosis not present

## 2020-12-28 DIAGNOSIS — U071 COVID-19: Secondary | ICD-10-CM | POA: Diagnosis present

## 2020-12-28 DIAGNOSIS — I5032 Chronic diastolic (congestive) heart failure: Secondary | ICD-10-CM | POA: Diagnosis not present

## 2020-12-28 DIAGNOSIS — I2510 Atherosclerotic heart disease of native coronary artery without angina pectoris: Secondary | ICD-10-CM | POA: Diagnosis present

## 2020-12-28 DIAGNOSIS — E871 Hypo-osmolality and hyponatremia: Secondary | ICD-10-CM | POA: Diagnosis not present

## 2020-12-28 DIAGNOSIS — Z96653 Presence of artificial knee joint, bilateral: Secondary | ICD-10-CM | POA: Diagnosis present

## 2020-12-28 DIAGNOSIS — A403 Sepsis due to Streptococcus pneumoniae: Secondary | ICD-10-CM | POA: Diagnosis not present

## 2020-12-28 DIAGNOSIS — B955 Unspecified streptococcus as the cause of diseases classified elsewhere: Secondary | ICD-10-CM | POA: Diagnosis not present

## 2020-12-28 DIAGNOSIS — T380X5A Adverse effect of glucocorticoids and synthetic analogues, initial encounter: Secondary | ICD-10-CM | POA: Diagnosis not present

## 2020-12-28 DIAGNOSIS — Z87891 Personal history of nicotine dependence: Secondary | ICD-10-CM | POA: Diagnosis not present

## 2020-12-28 DIAGNOSIS — J9601 Acute respiratory failure with hypoxia: Secondary | ICD-10-CM | POA: Diagnosis not present

## 2020-12-28 DIAGNOSIS — E872 Acidosis: Secondary | ICD-10-CM | POA: Diagnosis not present

## 2020-12-28 DIAGNOSIS — I4891 Unspecified atrial fibrillation: Secondary | ICD-10-CM | POA: Diagnosis not present

## 2020-12-28 DIAGNOSIS — E785 Hyperlipidemia, unspecified: Secondary | ICD-10-CM | POA: Diagnosis present

## 2020-12-28 DIAGNOSIS — I480 Paroxysmal atrial fibrillation: Secondary | ICD-10-CM | POA: Diagnosis present

## 2020-12-28 DIAGNOSIS — Z825 Family history of asthma and other chronic lower respiratory diseases: Secondary | ICD-10-CM | POA: Diagnosis not present

## 2020-12-28 DIAGNOSIS — Z87820 Personal history of traumatic brain injury: Secondary | ICD-10-CM | POA: Diagnosis not present

## 2020-12-28 DIAGNOSIS — Z888 Allergy status to other drugs, medicaments and biological substances status: Secondary | ICD-10-CM | POA: Diagnosis not present

## 2020-12-28 DIAGNOSIS — E1142 Type 2 diabetes mellitus with diabetic polyneuropathy: Secondary | ICD-10-CM | POA: Diagnosis not present

## 2020-12-28 DIAGNOSIS — I110 Hypertensive heart disease with heart failure: Secondary | ICD-10-CM | POA: Diagnosis not present

## 2020-12-28 DIAGNOSIS — J13 Pneumonia due to Streptococcus pneumoniae: Secondary | ICD-10-CM | POA: Diagnosis not present

## 2020-12-28 DIAGNOSIS — I10 Essential (primary) hypertension: Secondary | ICD-10-CM | POA: Diagnosis not present

## 2020-12-28 DIAGNOSIS — Z8249 Family history of ischemic heart disease and other diseases of the circulatory system: Secondary | ICD-10-CM | POA: Diagnosis not present

## 2020-12-28 DIAGNOSIS — E1165 Type 2 diabetes mellitus with hyperglycemia: Secondary | ICD-10-CM | POA: Diagnosis not present

## 2020-12-28 LAB — CBC WITH DIFFERENTIAL/PLATELET
Abs Immature Granulocytes: 0.01 10*3/uL (ref 0.00–0.07)
Basophils Absolute: 0 10*3/uL (ref 0.0–0.1)
Basophils Relative: 0 %
Eosinophils Absolute: 0 10*3/uL (ref 0.0–0.5)
Eosinophils Relative: 0 %
HCT: 41.6 % (ref 39.0–52.0)
Hemoglobin: 13.8 g/dL (ref 13.0–17.0)
Immature Granulocytes: 0 %
Lymphocytes Relative: 8 %
Lymphs Abs: 0.4 10*3/uL — ABNORMAL LOW (ref 0.7–4.0)
MCH: 29.3 pg (ref 26.0–34.0)
MCHC: 33.2 g/dL (ref 30.0–36.0)
MCV: 88.3 fL (ref 80.0–100.0)
Monocytes Absolute: 0.1 10*3/uL (ref 0.1–1.0)
Monocytes Relative: 2 %
Neutro Abs: 4.1 10*3/uL (ref 1.7–7.7)
Neutrophils Relative %: 90 %
Platelets: 175 10*3/uL (ref 150–400)
RBC: 4.71 MIL/uL (ref 4.22–5.81)
RDW: 13.5 % (ref 11.5–15.5)
WBC Morphology: INCREASED
WBC: 4.7 10*3/uL (ref 4.0–10.5)
nRBC: 0 % (ref 0.0–0.2)

## 2020-12-28 LAB — BLOOD CULTURE ID PANEL (REFLEXED) - BCID2

## 2020-12-28 LAB — CBG MONITORING, ED
Glucose-Capillary: 154 mg/dL — ABNORMAL HIGH (ref 70–99)
Glucose-Capillary: 181 mg/dL — ABNORMAL HIGH (ref 70–99)
Glucose-Capillary: 166 mg/dL — ABNORMAL HIGH (ref 70–99)

## 2020-12-28 LAB — LACTIC ACID, PLASMA
Lactic Acid, Venous: 3 mmol/L (ref 0.5–1.9)
Lactic Acid, Venous: 3.1 mmol/L (ref 0.5–1.9)
Lactic Acid, Venous: 3.4 mmol/L (ref 0.5–1.9)

## 2020-12-28 LAB — GLUCOSE, CAPILLARY
Glucose-Capillary: 192 mg/dL — ABNORMAL HIGH (ref 70–99)
Glucose-Capillary: 171 mg/dL — ABNORMAL HIGH (ref 70–99)

## 2020-12-28 LAB — PROCALCITONIN
Procalcitonin: 7.56 ng/mL
Procalcitonin: 6.7 ng/mL
Procalcitonin: 6.42 ng/mL

## 2020-12-28 LAB — RESP PANEL BY RT-PCR (FLU A&B, COVID) ARPGX2
Influenza A by PCR: NEGATIVE
Influenza B by PCR: NEGATIVE
SARS Coronavirus 2 by RT PCR: POSITIVE — AB

## 2020-12-28 MED ORDER — PROCHLORPERAZINE EDISYLATE 10 MG/2ML IJ SOLN
5.0000 mg | Freq: Four times a day (QID) | INTRAMUSCULAR | Status: DC | PRN
Start: 2020-12-28 — End: 2021-01-01

## 2020-12-28 MED ORDER — INSULIN ASPART 100 UNIT/ML IJ SOLN
0.0000 [IU] | Freq: Every day | INTRAMUSCULAR | Status: DC
  Administered 2020-12-29 – 2020-12-31 (×3): 3 [IU] via SUBCUTANEOUS

## 2020-12-28 MED ORDER — PREDNISONE 5 MG PO TABS: 50.0000 mg | ORAL_TABLET | Freq: Every day | ORAL | Status: DC

## 2020-12-28 MED ORDER — ACETAMINOPHEN 325 MG PO TABS: 650.0000 mg | ORAL_TABLET | Freq: Four times a day (QID) | ORAL | Status: DC | PRN

## 2020-12-28 MED ORDER — CLOPIDOGREL BISULFATE 75 MG PO TABS
75.0000 mg | ORAL_TABLET | Freq: Every day | ORAL | Status: DC
  Administered 2020-12-28 – 2020-12-31 (×4): 75 mg via ORAL
  Filled 2020-12-28 (×4): qty 1

## 2020-12-28 MED ORDER — THIAMINE HCL 100 MG PO TABS
100.0000 mg | ORAL_TABLET | Freq: Every day | ORAL | Status: DC
  Administered 2020-12-28 – 2021-01-01 (×5): 100 mg via ORAL
  Filled 2020-12-28 (×5): qty 1

## 2020-12-28 MED ORDER — POTASSIUM CHLORIDE CRYS ER 20 MEQ PO TBCR
40.0000 meq | EXTENDED_RELEASE_TABLET | Freq: Once | ORAL | Status: AC
  Administered 2020-12-28: 40 meq via ORAL
  Filled 2020-12-28: qty 2

## 2020-12-28 MED ORDER — ENSURE ENLIVE PO LIQD
237.0000 mL | Freq: Two times a day (BID) | ORAL | Status: DC
  Administered 2020-12-29: 237 mL via ORAL

## 2020-12-28 MED ORDER — SODIUM CHLORIDE 0.9 % IV SOLN
1.0000 g | INTRAVENOUS | Status: DC
  Filled 2020-12-28: qty 10

## 2020-12-28 MED ORDER — INSULIN ASPART 100 UNIT/ML IJ SOLN
0.0000 [IU] | Freq: Three times a day (TID) | INTRAMUSCULAR | Status: DC
  Administered 2020-12-28 – 2020-12-29 (×5): 2 [IU] via SUBCUTANEOUS
  Administered 2020-12-30: 3 [IU] via SUBCUTANEOUS
  Administered 2020-12-30 (×2): 5 [IU] via SUBCUTANEOUS
  Administered 2020-12-31: 7 [IU] via SUBCUTANEOUS
  Administered 2020-12-31: 5 [IU] via SUBCUTANEOUS
  Administered 2020-12-31 – 2021-01-01 (×2): 2 [IU] via SUBCUTANEOUS
  Administered 2021-01-01: 3 [IU] via SUBCUTANEOUS

## 2020-12-28 MED ORDER — ROSUVASTATIN CALCIUM 5 MG PO TABS
5.0000 mg | ORAL_TABLET | Freq: Every day | ORAL | Status: DC
  Administered 2020-12-28 – 2021-01-01 (×5): 5 mg via ORAL
  Filled 2020-12-28 (×5): qty 1

## 2020-12-28 MED ORDER — IPRATROPIUM-ALBUTEROL 20-100 MCG/ACT IN AERS
1.0000 | INHALATION_SPRAY | Freq: Four times a day (QID) | RESPIRATORY_TRACT | Status: DC
  Administered 2020-12-28 – 2021-01-01 (×16): 1 via RESPIRATORY_TRACT
  Filled 2020-12-28 (×2): qty 4

## 2020-12-28 MED ORDER — SODIUM CHLORIDE 0.9 % IV SOLN
200.0000 mg | Freq: Once | INTRAVENOUS | Status: AC
  Administered 2020-12-28: 200 mg via INTRAVENOUS
  Filled 2020-12-28: qty 40

## 2020-12-28 MED ORDER — FOLIC ACID 1 MG PO TABS
1.0000 mg | ORAL_TABLET | Freq: Every day | ORAL | Status: DC
  Administered 2020-12-28 – 2021-01-01 (×5): 1 mg via ORAL
  Filled 2020-12-28 (×5): qty 1

## 2020-12-28 MED ORDER — LOPERAMIDE HCL 2 MG PO CAPS: 2.0000 mg | ORAL_CAPSULE | ORAL | Status: DC | PRN

## 2020-12-28 MED ORDER — SODIUM CHLORIDE 0.9 % IV SOLN
100.0000 mg | Freq: Every day | INTRAVENOUS | Status: AC
  Administered 2020-12-29 – 2021-01-01 (×4): 100 mg via INTRAVENOUS
  Filled 2020-12-28 (×4): qty 20

## 2020-12-28 MED ORDER — ADULT MULTIVITAMIN W/MINERALS CH
1.0000 | ORAL_TABLET | Freq: Every day | ORAL | Status: DC
  Administered 2020-12-28 – 2021-01-01 (×5): 1 via ORAL
  Filled 2020-12-28 (×5): qty 1

## 2020-12-28 MED ORDER — ZINC SULFATE 220 (50 ZN) MG PO CAPS
220.0000 mg | ORAL_CAPSULE | Freq: Every day | ORAL | Status: DC
  Administered 2020-12-28 – 2021-01-01 (×5): 220 mg via ORAL
  Filled 2020-12-28 (×5): qty 1

## 2020-12-28 MED ORDER — SODIUM CHLORIDE 0.9 % IV SOLN
2.0000 g | INTRAVENOUS | Status: DC
  Administered 2020-12-28 – 2020-12-31 (×4): 2 g via INTRAVENOUS
  Filled 2020-12-28 (×4): qty 20

## 2020-12-28 MED ORDER — ISOSORBIDE MONONITRATE ER 30 MG PO TB24
30.0000 mg | ORAL_TABLET | Freq: Every day | ORAL | Status: DC
  Administered 2020-12-28 – 2021-01-01 (×5): 30 mg via ORAL
  Filled 2020-12-28 (×5): qty 1

## 2020-12-28 MED ORDER — VITAMIN D 25 MCG (1000 UNIT) PO TABS
1000.0000 [IU] | ORAL_TABLET | Freq: Every day | ORAL | Status: DC
  Administered 2020-12-28 – 2021-01-01 (×5): 1000 [IU] via ORAL
  Filled 2020-12-28 (×5): qty 1

## 2020-12-28 MED ORDER — ORAL CARE MOUTH RINSE
15.0000 mL | Freq: Two times a day (BID) | OROMUCOSAL | Status: DC
  Administered 2020-12-28 – 2021-01-01 (×8): 15 mL via OROMUCOSAL

## 2020-12-28 MED ORDER — ASCORBIC ACID 500 MG PO TABS
500.0000 mg | ORAL_TABLET | Freq: Every day | ORAL | Status: DC
  Administered 2020-12-28 – 2021-01-01 (×5): 500 mg via ORAL
  Filled 2020-12-28 (×5): qty 1

## 2020-12-28 MED ORDER — METHYLPREDNISOLONE SODIUM SUCC 125 MG IJ SOLR
80.0000 mg | Freq: Two times a day (BID) | INTRAMUSCULAR | Status: DC
  Administered 2020-12-28 (×2): 80 mg via INTRAVENOUS
  Filled 2020-12-28 (×2): qty 2

## 2020-12-28 MED ORDER — SODIUM CHLORIDE 0.9 % IV BOLUS
1000.0000 mL | Freq: Once | INTRAVENOUS | Status: AC
  Administered 2020-12-28: 1000 mL via INTRAVENOUS

## 2020-12-28 MED ORDER — ENOXAPARIN SODIUM 40 MG/0.4ML IJ SOSY
40.0000 mg | PREFILLED_SYRINGE | Freq: Every day | INTRAMUSCULAR | Status: DC
  Administered 2020-12-28 – 2020-12-31 (×4): 40 mg via SUBCUTANEOUS
  Filled 2020-12-28 (×4): qty 0.4

## 2020-12-28 MED ORDER — SODIUM CHLORIDE 0.9 % IV SOLN
500.0000 mg | INTRAVENOUS | Status: DC
  Filled 2020-12-28: qty 500

## 2020-12-28 MED ORDER — GUAIFENESIN-DM 100-10 MG/5ML PO SYRP
10.0000 mL | ORAL_SOLUTION | ORAL | Status: DC | PRN
  Administered 2020-12-28 – 2020-12-29 (×2): 10 mL via ORAL
  Filled 2020-12-28 (×2): qty 10

## 2020-12-28 NOTE — Telephone Encounter (Signed)
Pt's son, Reita Cliche, left a message on triage stating pt is in the ER right now and they are not doing anything or him and wanted to know if Dr Diona Browner or another provider could call and have them do something for him. I called back and advised him he is not on the DPR but that we have no rights at the hospital, so there is nothing our providers can do for a pt in the ER.

## 2020-12-28 NOTE — Progress Notes (Signed)
Patient was admitted to [phone]. Wife at bedside. On 3L O2 [place]. No complaints of pain. A&Ox4. Belongings include wedding band and watch. Have oriented patient to the unit and placed a call bell and working phone within reach.

## 2020-12-28 NOTE — ED Notes (Signed)
Breakfast orders placed 

## 2020-12-28 NOTE — Progress Notes (Addendum)
PHARMACY - PHYSICIAN COMMUNICATION CRITICAL VALUE ALERT - BLOOD CULTURE IDENTIFICATION (BCID)  GERMAIN KOOPMANN is an 86 y.o. male who presented to [facility] on 12/27/2020 with a chief complaint of COVID pneumonia now found to have strep pneumoniae bacteremia   Assessment:  4 of 4 blood cultures growing Strep penumoniae, no resistance on BCID. Patient is on ceftriaxone and azithromycin.    Name of physician (or Provider) Contacted: Dr. Nevada Crane  Current antibiotics: ceftriaxone and azithromycin.  Changes to prescribed antibiotics recommended:  Patient is on recommended antibiotics - No changes needed. Increase ceftriaxone to 2g given bacteremia   No results found for this or any previous visit.  Benetta Spar, PharmD, BCPS, BCCP Clinical Pharmacist  Please check AMION for all [place] phone numbers After 10:00 PM, call [place] [phone]

## 2020-12-28 NOTE — Progress Notes (Signed)
PROGRESS NOTE    [name]  TIR:[id number] DOB: [birth year] DOA: 12/27/2020 PCP: Jinny Sanders, MD      Brief Narrative:  [name] is a 86 y.o. 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  Diagnosed with COVID 10 days ago, was dealing with some mild cough and weakness, but was getting better until the last few days, started to develop worsening cough, malaise.    Then got very weak, temp 101F at home, had nausea, vomiting, weakness, and came to the ER.  There, CXR with multifocal pneumonia, appeared septic.          Assessment & Plan:  Severe sepsis due to Strep pneumonia bacteremia and pneumonia Presented with tachcyardia, fever, tachypnea and acute respiratory failure.  -Continue Rocephin -Continue IV fluids -Trend Lactate to normal -Hold aithromycin - Follow cultures   COVID Resolving, incidental.  Symptoms were improving prior to getting sick again, I feel there is low utility in this circumstance for Remdesivir or steroids. -Stop steroids, resmdesivir   Acute hypoxic respiratory failure Here SpO2 86% on room air, not on O2 at home.  Due to multifocal pneumonia   Coronary disease Hypertension BP elevated -Hold amloipdine and lisinopril until hemodynamics clearer -Continue Imdur -Continue Crsetor  Paroxysmal atrial fibrillation In sinus here, rate fast due to sepsis Not on AC or rate control  Diabetes Glucoses here high normal - Stop steroids - Continue sliding scale corrections -Continue Crestor  Hyponatremia Mild, asymptomatic  Hypokalemia Potassium supplemented            MDM: This is a no charge note.  For further details, please see H&P by my partner Dr. Nevada Crane from earlier today.  The below labs and imaging reports were reviewed and summarized above.    DVT prophylaxis: enoxaparin (LOVENOX) injection 40 mg Start: 12/28/20 1000  Code Status: FULL Family Communication:  Wife at bedside, son by phonoe              Subjective:         Objective: Vitals:   12/28/20 1000 12/28/20 1300 12/28/20 1415 12/28/20 1636  BP: (!) 159/121 (!) 192/87 (!) 149/77 (!) 160/97  Pulse: (!) 108 (!) 102 (!) 108 (!) 105  Resp: (!) 29 (!) 22 (!) 23 18  Temp:    98.4 F (36.9 C)  TempSrc:    Oral  SpO2: 92% 90% 91% 91%  Weight:      Height:       No intake or output data in the 24 hours ending 12/28/20 1745 Filed Weights   12/27/20 2130  Weight: 79.4 kg    Examination: The patient was seen and examined.      Data Reviewed: I have personally reviewed following labs and imaging studies:  CBC: Recent Labs  Lab 12/27/20 2155  WBC 4.7  NEUTROABS 4.1  HGB 13.8  HCT 41.6  MCV 88.3  PLT 676   Basic Metabolic Panel: Recent Labs  Lab 12/27/20 2155  NA 134*  K 3.4*  CL 103  CO2 20*  GLUCOSE 151*  BUN 26*  CREATININE 1.00  CALCIUM 8.7*   GFR: Estimated Creatinine Clearance: 55.5 mL/min (by C-G formula based on SCr of 1 mg/dL). Liver Function Tests: Recent Labs  Lab 12/27/20 2155  AST 25  ALT 19  ALKPHOS 37*  BILITOT 1.3*  PROT 6.3*  ALBUMIN 3.0*   No results for input(s): LIPASE, AMYLASE in the  last 168 hours. No results for input(s): AMMONIA in the last 168 hours. Coagulation Profile: No results for input(s): INR, PROTIME in the last 168 hours. Cardiac Enzymes: No results for input(s): CKTOTAL, CKMB, CKMBINDEX, TROPONINI in the last 168 hours. BNP (last 3 results) No results for input(s): PROBNP in the last 8760 hours. HbA1C: No results for input(s): HGBA1C in the last 72 hours. CBG: Recent Labs  Lab 12/28/20 0221 12/28/20 0815 12/28/20 1204 12/28/20 1730  GLUCAP 154* 181* 166* 192*   Lipid Profile: Recent Labs    12/27/20 2155  TRIG 111   Thyroid Function Tests: No results for input(s): TSH, T4TOTAL, FREET4, T3FREE, THYROIDAB in the last 72 hours. Anemia Panel: Recent Labs    12/27/20 2155  FERRITIN  339*   Urine analysis:    Component Value Date/Time   COLORURINE YELLOW 05/04/2016 [place] 05/04/2016 1608   LABSPEC 1.015 05/04/2016 1608   PHURINE 6.0 05/04/2016 1608   GLUCOSEU NEGATIVE 05/04/2016 1608   HGBUR NEGATIVE 05/04/2016 1608   BILIRUBINUR NEGATIVE 05/04/2016 1608   KETONESUR NEGATIVE 05/04/2016 1608   PROTEINUR 30 (A) 04/19/2016 1935   UROBILINOGEN 0.2 05/04/2016 1608   NITRITE NEGATIVE 05/04/2016 1608   LEUKOCYTESUR NEGATIVE 05/04/2016 1608   Sepsis Labs: @LABRCNTIP (procalcitonin:4,lacticacidven:4)  ) Recent Results (from the past 240 hour(s))  Blood Culture (routine x 2)     Status: None (Preliminary result)   Collection Time: 12/27/20 10:30 PM   Specimen: BLOOD  Result Value Ref Range Status   Specimen Description BLOOD RIGHT ANTECUBITAL  Final   Special Requests   Final    BOTTLES DRAWN AEROBIC AND ANAEROBIC Blood Culture adequate volume   Culture  Setup Time   Final    GRAM POSITIVE COCCI IN BOTH AEROBIC AND ANAEROBIC BOTTLES CRITICAL VALUE NOTED.  VALUE IS CONSISTENT WITH PREVIOUSLY REPORTED AND CALLED VALUE. Performed at [facility], [place] [address], [place], [place] [address]    Culture GRAM POSITIVE COCCI  Final   Report Status PENDING  Incomplete  Blood Culture (routine x 2)     Status: None (Preliminary result)   Collection Time: 12/27/20 10:40 PM   Specimen: BLOOD RIGHT HAND  Result Value Ref Range Status   Specimen Description BLOOD RIGHT HAND  Final   Special Requests   Final    BOTTLES DRAWN AEROBIC AND ANAEROBIC Blood Culture results may not be optimal due to an inadequate volume of blood received in culture bottles   Culture  Setup Time   Final    GRAM POSITIVE COCCI IN BOTH AEROBIC AND ANAEROBIC BOTTLES CRITICAL RESULT CALLED TO, READ BACK BY AND VERIFIED WITH: PHARMD L CHEN [id number] FCP Performed at [facility], [place] [address] [place], [place] [address]    Culture GRAM POSITIVE COCCI  Final   Report  Status PENDING  Incomplete  Blood Culture ID Panel (Reflexed)     Status: Abnormal   Collection Time: 12/27/20 10:40 PM  Result Value Ref Range Status   Enterococcus faecalis NOT DETECTED NOT DETECTED Final   Enterococcus Faecium NOT DETECTED NOT DETECTED Final   Listeria monocytogenes NOT DETECTED NOT DETECTED Final   Staphylococcus species NOT DETECTED NOT DETECTED Final   Staphylococcus aureus (BCID) NOT DETECTED NOT DETECTED Final   Staphylococcus epidermidis NOT DETECTED NOT DETECTED Final   Staphylococcus lugdunensis NOT DETECTED NOT DETECTED Final   Streptococcus species DETECTED (A) NOT DETECTED Final    Comment: CRITICAL RESULT CALLED TO, READ BACK  BY AND VERIFIED WITH: PHARMD L CHEN [id number] FCP    Streptococcus agalactiae NOT DETECTED NOT DETECTED Final   Streptococcus pneumoniae DETECTED (A) NOT DETECTED Final    Comment: CRITICAL RESULT CALLED TO, READ BACK BY AND VERIFIED WITH: PHARMD L CHEN [id number] FCP    Streptococcus pyogenes NOT DETECTED NOT DETECTED Final   A.calcoaceticus-baumannii NOT DETECTED NOT DETECTED Final   Bacteroides fragilis NOT DETECTED NOT DETECTED Final   Enterobacterales NOT DETECTED NOT DETECTED Final   Enterobacter cloacae complex NOT DETECTED NOT DETECTED Final   Escherichia coli NOT DETECTED NOT DETECTED Final   Klebsiella aerogenes NOT DETECTED NOT DETECTED Final   Klebsiella oxytoca NOT DETECTED NOT DETECTED Final   Klebsiella pneumoniae NOT DETECTED NOT DETECTED Final   Proteus species NOT DETECTED NOT DETECTED Final   Salmonella species NOT DETECTED NOT DETECTED Final   Serratia marcescens NOT DETECTED NOT DETECTED Final   Haemophilus influenzae NOT DETECTED NOT DETECTED Final   Neisseria meningitidis NOT DETECTED NOT DETECTED Final   Pseudomonas aeruginosa NOT DETECTED NOT DETECTED Final   Stenotrophomonas maltophilia NOT DETECTED NOT DETECTED Final   Candida albicans NOT DETECTED NOT DETECTED Final   Candida auris NOT DETECTED  NOT DETECTED Final   Candida glabrata NOT DETECTED NOT DETECTED Final   Candida krusei NOT DETECTED NOT DETECTED Final   Candida parapsilosis NOT DETECTED NOT DETECTED Final   Candida tropicalis NOT DETECTED NOT DETECTED Final   Cryptococcus neoformans/gattii NOT DETECTED NOT DETECTED Final    Comment: Performed at [facility], [address], [place], [place] [address]  Resp Panel by RT-PCR (Flu A&B, Covid) Nasopharyngeal Swab     Status: Abnormal   Collection Time: 12/27/20 10:58 PM   Specimen: Nasopharyngeal Swab; Nasopharyngeal(NP) swabs in vial transport medium  Result Value Ref Range Status   SARS Coronavirus 2 by RT PCR POSITIVE (A) NEGATIVE Final    Comment: RESULT CALLED TO, READ BACK BY AND VERIFIED WITH: RN TATE GRACE BY MESSAN H. AT 2358 ON 6 19 2022 (NOTE) SARS-CoV-2 target nucleic acids are DETECTED.  The SARS-CoV-2 RNA is generally detectable in upper respiratory specimens during the acute phase of infection. Positive results are indicative of the presence of the identified virus, but do not rule out bacterial infection or co-infection with other pathogens not detected by the test. Clinical correlation with patient history and other diagnostic information is necessary to determine patient infection status. The expected result is Negative.  Fact Sheet for Patients: EntrepreneurPulse.com.au  Fact Sheet for Healthcare Providers: IncredibleEmployment.be  This test is not yet approved or cleared by the [place] FDA and  has been authorized for detection and/or diagnosis of SARS-CoV-2 by FDA under an Emergency Use Authorization (EUA).  This EUA will remain in effect (meaning th is test can be used) for the duration of  the COVID-19 declaration under Section 564(b)(1) of the Act, 21 U.S.C. section 360bbb-3(b)(1), unless the authorization is terminated or revoked sooner.     Influenza A by PCR NEGATIVE NEGATIVE Final    Influenza B by PCR NEGATIVE NEGATIVE Final    Comment: (NOTE) The Xpert Xpress SARS-CoV-2/FLU/RSV plus assay is intended as an aid in the diagnosis of influenza from Nasopharyngeal swab specimens and should not be used as a sole basis for treatment. Nasal washings and aspirates are unacceptable for Xpert Xpress SARS-CoV-2/FLU/RSV testing.  Fact Sheet for Patients: EntrepreneurPulse.com.au  Fact Sheet for Healthcare Providers: IncredibleEmployment.be  This test is not yet approved or cleared  by the [place] and has been authorized for detection and/or diagnosis of SARS-CoV-2 by FDA under an Emergency Use Authorization (EUA). This EUA will remain in effect (meaning this test can be used) for the duration of the COVID-19 declaration under Section 564(b)(1) of the Act, 21 U.S.C. section 360bbb-3(b)(1), unless the authorization is terminated or revoked.  Performed at [facility], [place] [address] [place], [place] [address]          Radiology Studies: DG Chest Port 1 View  Result Date: 12/27/2020 CLINICAL DATA:  Hypoxia and cough, COVID-19 positivity EXAM: PORTABLE CHEST 1 VIEW COMPARISON:  04/21/2016 FINDINGS: Cardiac shadow is prominent but stable. Large hiatal hernia is again identified. Postsurgical changes are seen. Patchy airspace opacities are noted in the bases bilaterally likely related to the patient's given clinical history. No bony abnormality is noted. IMPRESSION: Patchy bibasilar airspace opacities consistent with the given clinical history. Electronically Signed   By: Inez Catalina M.D.   On: 12/27/2020 22:45        Scheduled Meds:  vitamin C  500 mg Oral Daily   cholecalciferol  1,000 Units Oral Daily   enoxaparin (LOVENOX) injection  40 mg Subcutaneous Daily   [START ON 12/29/2020] feeding supplement  237 mL Oral BID BM   folic acid  1 mg Oral Daily   insulin aspart  0-5 Units Subcutaneous QHS   insulin aspart   0-9 Units Subcutaneous TID WC   Ipratropium-Albuterol  1 puff Inhalation Q6H   methylPREDNISolone (SOLU-MEDROL) injection  80 mg Intravenous BID   Followed by   Derrill Memo ON 12/31/2020] predniSONE  50 mg Oral Daily   multivitamin with minerals  1 tablet Oral Daily   thiamine  100 mg Oral Daily   zinc sulfate  220 mg Oral Daily   Continuous Infusions:  azithromycin     cefTRIAXone (ROCEPHIN)  IV     [START ON 12/29/2020] remdesivir 100 mg in NS 100 mL       LOS: 0 days    Time spent: 15 minutes    Edwin Dada, MD [facility] 12/28/2020, 5:45 PM     Please page though [place] or Epic secure chat:  For password, contact charge nurse

## 2020-12-28 NOTE — H&P (Addendum)
History and Physical  [name]:[id number] DOB: June 12, 1935 DOA: 12/27/2020  Referring physician: Dr. Regenia Skeeter, [place]. PCP: Jinny Sanders, MD  Outpatient Specialists: Cardiology. Patient coming from: Home.   Chief Complaint: Worsening weakness and cough  HPI: [name] is a 86 y.o. male with medical history significant for coronary artery disease status post CABG in 2016, essential hypertension, hyperlipidemia, paroxysmal atrial fibrillation, GERD who presented to [facility] ED from home due to worsening global weakness associated with worsening productive cough and intermittent nausea and vomiting.  Due to similar symptoms 10 days ago, he took a home COVID screening test which came back positive on 12/18/2020.  His symptomatology was improving until 2 days prior to presentation when he started having chills, fever with Tmax 101.7 at home, nausea, vomiting, diarrhea, and poor appetite.  He presented to the ED via EMS for further evaluation.  In the ED work-up revealed multifocal pneumonia secondary to COVID-19 viral infection and likely superimposed bacterial pulmonary infection.  He was started on COVID-19 directed therapies and Rocephin/IV azithromycin.  Patient is vaccinated against covid, has received 2 doses of Pfizzer vaccine.  TRH, hospitalist team, was asked to admit.  ED Course:  Temperature 98.4.  BP 159/81, pulse 106, respiratory 25, O2 saturation 89% on 3 L.  Lab studies remarkable for serum sodium 134, potassium 3.4, serum bicarb 20, glucose 151, BUN 26, creatinine 1.0, anion gap 11, total bilirubin 1.3, GFR greater than 60.  Ferritin 339, lactic acid 3.4, 4.3.  Procalcitonin 7.56.  Chest x-ray patchy bibasilar airspace opacity consistent with the given clinical history.  Review of Systems: Review of systems as noted in the HPI. All other systems reviewed and are negative.   Past Medical History:  Diagnosis Date   CAD (coronary artery disease)    s/p stent mid LAD 2005 // s/p CABG  2016 // Myoview 8/19:  EF 64, no ischemia   Coronary artery disease involving native coronary artery of native heart without angina pectoris    Depression    DJD (degenerative joint disease)    DM type 2 with diabetic peripheral neuropathy ([place]) 03/10/2017   E coli bacteremia 04/20/2016   Essential hypertension 04/20/2016   GERD (gastroesophageal reflux disease)    History of echocardiogram    Echo 8/19: Moderate LVH, EF 60-65, normal wall motion, grade 1 diastolic dysfunction, mild MR, mild LAE, normal RVSF, mild TR, PASP 25   History of hiatal hernia    HTN (hypertension)    Hyperlipidemia    Neuropathy due to type 2 diabetes mellitus ([place]) 03/10/2017   PERCUTANEOUS TRANSLUMINAL CORONARY ANGIOPLASTY, HX OF 11/26/2007   Annotation: With CYPHER STENT. Qualifier: Diagnosis of  By: Danny Lawless CMA, [place]     Pre-diabetes    S/P CABG x 4 11/03/2014   S/P total knee replacement 04/11/2016   Sepsis due to Escherichia coli (E. coli) ([place]) 04/20/2016   Tinea pedis 09/08/2017   Past Surgical History:  Procedure Laterality Date   BRAIN SURGERY     CARDIAC CATHETERIZATION     CORONARY ARTERY BYPASS GRAFT N/A 11/03/2014   Procedure: CORONARY ARTERY BYPASS GRAFTING (CABG), ON PUMP, TIMES FOUR, USING LEFT INTERNAL MAMMARY, RIGHT GREATER SAPHENOUS VEIN HARVESTED ENDOSCOPICALLY;  Surgeon: Ivin Poot, MD;  Location: [place];  Service: Open Heart Surgery;  Laterality: N/A;  LIMA-LAD; SVG-DIAG; SVG-OM; SVG-RCA   CRANIOTOMY N/A 01/18/2014   Procedure: CRANIOTOMY HEMATOMA EVACUATION SUBDURAL;  Surgeon: Hosie Spangle, MD;  Location: [place];  Service: Neurosurgery;  Laterality: N/A;   kidney stone removal     KNEE SURGERY     LEFT HEART CATHETERIZATION WITH CORONARY ANGIOGRAM N/A 07/27/2011   Procedure: LEFT HEART CATHETERIZATION WITH CORONARY ANGIOGRAM;  Surgeon: Burnell Blanks, MD;  Location: [facility] CATH LAB;  Service: Cardiovascular;  Laterality: N/A;   LEFT HEART CATHETERIZATION WITH CORONARY ANGIOGRAM  N/A 10/31/2014   Procedure: LEFT HEART CATHETERIZATION WITH CORONARY ANGIOGRAM;  Surgeon: Sherren Mocha, MD;  Location: [facility] CATH LAB;  Service: Cardiovascular;  Laterality: N/A;   PTCA     Hx of it.    SIGMOIDOSCOPY     TEE WITHOUT CARDIOVERSION N/A 11/03/2014   Procedure: TRANSESOPHAGEAL ECHOCARDIOGRAM (TEE);  Surgeon: Ivin Poot, MD;  Location: [facility];  Service: Open Heart Surgery;  Laterality: N/A;   TOTAL KNEE ARTHROPLASTY  02/2011   Right knee   TOTAL KNEE ARTHROPLASTY Left 04/11/2016   Procedure: LEFT TOTAL KNEE ARTHROPLASTY;  Surgeon: Vickey Huger, MD;  Location: [place];  Service: Orthopedics;  Laterality: Left;    Social History:  reports that he has quit smoking. He has quit using smokeless tobacco. He reports that he does not drink alcohol and does not use drugs.   Allergies  Allergen Reactions   Protamine Rash    Hypotension, Rash, unstable vitals.     Statins Other (See Comments)    Caused soreness (Lipitor, Zocor)   Adhesive [Tape] Other (See Comments)   Rosuvastatin Calcium Other (See Comments)    Caused soreness (Lipitor, Zocor)   Imodium [Loperamide] Itching and Rash    Family History  Problem Relation Age of Onset   Heart disease Father    Heart attack Father    Heart failure Mother    COPD Mother    Heart disease Sister    Heart attack Sister    Heart attack Brother       Prior to Admission medications   Medication Sig Start Date End Date Taking? Authorizing Provider  acetaminophen (TYLENOL) 500 MG tablet Take 500-1,000 mg by mouth 2 (two) times daily as needed (for pain).     [provider]  amLODipine (NORVASC) 5 MG tablet Take 1 tablet by mouth once daily Patient not taking: Reported on 11/03/2020 09/08/20   Burnell Blanks, MD  cholecalciferol (VITAMIN D3) 25 MCG (1000 UNIT) tablet Take 1,000 Units by mouth daily.    [provider]  clopidogrel (PLAVIX) 75 MG tablet Take 1 tablet by mouth once daily 09/08/20   Burnell Blanks, MD  isosorbide mononitrate (IMDUR) 30 MG 24 hr tablet Take 1 tablet by mouth once daily 12/04/20   Burnell Blanks, MD  lisinopril (ZESTRIL) 40 MG tablet Take 1 tablet by mouth once daily 10/16/20   Burnell Blanks, MD  Multiple Vitamins-Minerals (ONE-A-DAY MENS HEALTH FORMULA PO) Take by mouth. Patient not taking: Reported on 11/03/2020    [provider]  nitroGLYCERIN (NITROSTAT) 0.4 MG SL tablet Place 1 tablet (0.4 mg total) under the tongue every 5 (five) minutes as needed for chest pain. 09/17/19   Bedsole, Amy E, MD  pantoprazole (PROTONIX) 40 MG tablet Take 1 tablet by mouth once daily 07/13/20   Bedsole, Amy E, MD  rosuvastatin (CRESTOR) 5 MG tablet Take 1 tablet (5 mg total) by mouth daily. 09/17/19   Bedsole, Amy E, MD  vitamin B-12 (CYANOCOBALAMIN) 1000 MCG tablet Take 1,000 mcg by mouth daily.    [provider]    Physical Exam: BP (!) 159/81  Pulse (!) 106   Temp 98.4 F (36.9 C) (Oral)   Resp (!) 25   Ht 5\' 7"  (1.702 m)   Wt 79.4 kg   SpO2 (!) 89%   BMI 27.41 kg/m   General: 86 y.o. year-old male Frail appearing in no acute distress.  Alert and oriented x3.  Very hard of hearing. Cardiovascular: Irregular rate and rhythm with no rubs or gallops.  No thyromegaly or JVD noted.  No lower extremity edema. 2/4 pulses in all 4 extremities. Respiratory: Diffused rales bilaterally. Good inspiratory effort. Abdomen: Soft nontender nondistended with normal bowel sounds x4 quadrants. Muskuloskeletal: No cyanosis, clubbing or edema noted bilaterally Neuro: CN II-XII intact, strength, sensation, reflexes Skin: No ulcerative lesions noted or rashes Psychiatry: Judgement and insight appear normal. Mood is appropriate for condition and setting          Labs on Admission:  Basic Metabolic Panel: Recent Labs  Lab 12/27/20 2155  NA 134*  K 3.4*  CL 103  CO2 20*  GLUCOSE 151*  BUN 26*  CREATININE 1.00  CALCIUM 8.7*   Liver Function  Tests: Recent Labs  Lab 12/27/20 2155  AST 25  ALT 19  ALKPHOS 37*  BILITOT 1.3*  PROT 6.3*  ALBUMIN 3.0*   No results for input(s): LIPASE, AMYLASE in the last 168 hours. No results for input(s): AMMONIA in the last 168 hours. CBC: Recent Labs  Lab 12/27/20 2155  WBC 4.7  NEUTROABS PENDING  HGB 13.8  HCT 41.6  MCV 88.3  PLT 175   Cardiac Enzymes: No results for input(s): CKTOTAL, CKMB, CKMBINDEX, TROPONINI in the last 168 hours.  BNP (last 3 results) No results for input(s): BNP in the last 8760 hours.  ProBNP (last 3 results) No results for input(s): PROBNP in the last 8760 hours.  CBG: No results for input(s): GLUCAP in the last 168 hours.  Radiological Exams on Admission: DG Chest Port 1 View  Result Date: 12/27/2020 CLINICAL DATA:  Hypoxia and cough, COVID-19 positivity EXAM: PORTABLE CHEST 1 VIEW COMPARISON:  04/21/2016 FINDINGS: Cardiac shadow is prominent but stable. Large hiatal hernia is again identified. Postsurgical changes are seen. Patchy airspace opacities are noted in the bases bilaterally likely related to the patient's given clinical history. No bony abnormality is noted. IMPRESSION: Patchy bibasilar airspace opacities consistent with the given clinical history. Electronically Signed   By: Inez Catalina M.D.   On: 12/27/2020 22:45    EKG: I independently viewed the EKG done and my findings are as followed: A. fib rate of 104, nonspecific ST-T changes.  QTc 479.  Assessment/Plan Present on Admission:  Pneumonia due to COVID-19 virus  Active Problems:   Pneumonia due to COVID-19 virus  COVID-19 viral pneumonia, superimposed by bacterial pulmonary infection, POA. Obtain sputum culture Personally reviewed chest x-ray done on admission which shows bilateral pulmonary infiltrates Procalcitonin 7.56, repeat in the morning. IV remdesivir x 5 days IV Solu-Medrol x 5 days IV Rocephin, IV azithromycin x 5 days Bronchodilators Pulmonary toilet Incentive  spirometer/flutter valve Vitamin C, D3 and zinc Mobilize as tolerated  Acute hypoxic respiratory failure secondary to above Not on oxygen supplementation at baseline Currently requiring 6 L to maintain O2 saturation above 92%. Maintain O2 saturation greater than 92% Rest of management as stated above.  Nausea, intermittent vomiting, diarrhea likely 2/2 to covid-19 viral infection Symptomatic management IV antiemetics prn Imodium prn  Paroxysmal A. fib not on oral anticoagulation Hx of bradycardia/second degree AV block Monitor on telemetry  Coronary artery disease status post CABG No anginal symptoms  Resume home antiplatelet and statin  Type 2 diabetes with hyperglycemia Likely will be exacerbated by IV steroids Obtain hemoglobin A1c Continue insulin sliding scale.  Electrolyte derangement Hyponatremia, serum sodium 134, repeat in the morning Hypokalemia 3.4, repleted, recheck in the morning. Obtain magnesium level.  Lactic acidosis Presented with lactic acid 4.3, down-trending  Isolated elevated T bilirubin T bilirubin on presentation 1.3 Non specific Monitor for now  Chronic diastolic CHF Last 2D echo done on 08/16/2019 showed LVEF 60 to 75% grade 1 diastolic dysfunction. Strict I's and O's and daily weight  Essential hypertension Resume home regimen.  Hyperlipidemia/GERD Resume home regimen.     DVT prophylaxis: Subcu Lovenox daily  Code Status: Full code  Family Communication: Updated his wife via phone on 12/28/20.   Disposition Plan: Admit to progressive unit.  Consults called: None  Admission status: Inpatient status.  Patient will require at least 2 midnights for further evaluation and treatment of present conditions.   Status is: Inpatient    Dispo:  Patient From: Home  Planned Disposition: Home, possibly on 12/30/2020 or when symptomatology has improved.  Medically stable for discharge: No         Kayleen Memos MD [facility] Pager [phone]  If 7PM-7AM, please contact night-coverage [email or web address] Password [facility]  12/28/2020, 12:26 AM

## 2020-12-28 NOTE — ED Notes (Signed)
Report received from [place] RN. Patient currently resting in room, awaiting bed assignment. Wife at bedside. Hospitalist at bedside at this time.

## 2020-12-29 DIAGNOSIS — R7881 Bacteremia: Secondary | ICD-10-CM

## 2020-12-29 DIAGNOSIS — J189 Pneumonia, unspecified organism: Secondary | ICD-10-CM

## 2020-12-29 DIAGNOSIS — B955 Unspecified streptococcus as the cause of diseases classified elsewhere: Secondary | ICD-10-CM

## 2020-12-29 LAB — COMPREHENSIVE METABOLIC PANEL
ALT: 18 U/L (ref 0–44)
AST: 25 U/L (ref 15–41)
Albumin: 2.3 g/dL — ABNORMAL LOW (ref 3.5–5.0)
Alkaline Phosphatase: 34 U/L — ABNORMAL LOW (ref 38–126)
Anion gap: 8 (ref 5–15)
BUN: 19 mg/dL (ref 8–23)
CO2: 24 mmol/L (ref 22–32)
Calcium: 8.2 mg/dL — ABNORMAL LOW (ref 8.9–10.3)
Chloride: 104 mmol/L (ref 98–111)
Creatinine, Ser: 0.83 mg/dL (ref 0.61–1.24)
GFR, Estimated: >60 mL/min (ref >60)
Glucose, Bld: 196 mg/dL — ABNORMAL HIGH (ref 70–99)
Potassium: 3.6 mmol/L (ref 3.5–5.1)
Sodium: 136 mmol/L (ref 135–145)
Total Bilirubin: 0.9 mg/dL (ref 0.3–1.2)
Total Protein: 5.3 g/dL — ABNORMAL LOW (ref 6.5–8.1)

## 2020-12-29 LAB — CBC WITH DIFFERENTIAL/PLATELET
Abs Immature Granulocytes: 0.03 10*3/uL (ref 0.00–0.07)
Basophils Absolute: 0 10*3/uL (ref 0.0–0.1)
Basophils Relative: 0 %
Eosinophils Absolute: 0 10*3/uL (ref 0.0–0.5)
Eosinophils Relative: 0 %
HCT: 34.2 % — ABNORMAL LOW (ref 39.0–52.0)
Hemoglobin: 11.8 g/dL — ABNORMAL LOW (ref 13.0–17.0)
Immature Granulocytes: 1 %
Lymphocytes Relative: 6 %
Lymphs Abs: 0.4 10*3/uL — ABNORMAL LOW (ref 0.7–4.0)
MCH: 29.4 pg (ref 26.0–34.0)
MCHC: 34.5 g/dL (ref 30.0–36.0)
MCV: 85.3 fL (ref 80.0–100.0)
Monocytes Absolute: 0.2 10*3/uL (ref 0.1–1.0)
Monocytes Relative: 3 %
Neutro Abs: 4.9 10*3/uL (ref 1.7–7.7)
Neutrophils Relative %: 90 %
Platelets: 207 10*3/uL (ref 150–400)
RBC: 4.01 MIL/uL — ABNORMAL LOW (ref 4.22–5.81)
RDW: 13.6 % (ref 11.5–15.5)
WBC: 5.4 10*3/uL (ref 4.0–10.5)
nRBC: 0 % (ref 0.0–0.2)

## 2020-12-29 LAB — LACTIC ACID, PLASMA
Lactic Acid, Venous: 2.7 mmol/L (ref 0.5–1.9)
Lactic Acid, Venous: 2.3 mmol/L (ref 0.5–1.9)

## 2020-12-29 LAB — GLUCOSE, CAPILLARY
Glucose-Capillary: 182 mg/dL — ABNORMAL HIGH (ref 70–99)
Glucose-Capillary: 165 mg/dL — ABNORMAL HIGH (ref 70–99)
Glucose-Capillary: 181 mg/dL — ABNORMAL HIGH (ref 70–99)
Glucose-Capillary: 260 mg/dL — ABNORMAL HIGH (ref 70–99)

## 2020-12-29 LAB — MRSA NEXT GEN BY PCR, NASAL: MRSA by PCR Next Gen: NOT DETECTED

## 2020-12-29 MED ORDER — LACTATED RINGERS IV BOLUS
1000.0000 mL | Freq: Once | INTRAVENOUS | Status: AC
  Administered 2020-12-29: 1000 mL via INTRAVENOUS

## 2020-12-29 MED ORDER — AMLODIPINE BESYLATE 5 MG PO TABS
5.0000 mg | ORAL_TABLET | Freq: Every day | ORAL | Status: DC
  Administered 2020-12-29 – 2021-01-01 (×4): 5 mg via ORAL
  Filled 2020-12-29 (×4): qty 1

## 2020-12-29 MED ORDER — ENSURE ENLIVE PO LIQD
237.0000 mL | Freq: Three times a day (TID) | ORAL | Status: DC
  Administered 2020-12-29 – 2021-01-01 (×8): 237 mL via ORAL

## 2020-12-29 MED ORDER — METHYLPREDNISOLONE SODIUM SUCC 40 MG IJ SOLR
40.0000 mg | Freq: Two times a day (BID) | INTRAMUSCULAR | Status: DC
  Administered 2020-12-29 – 2020-12-31 (×5): 40 mg via INTRAVENOUS
  Filled 2020-12-29 (×5): qty 1

## 2020-12-29 MED ORDER — LISINOPRIL 40 MG PO TABS
40.0000 mg | ORAL_TABLET | Freq: Every day | ORAL | Status: DC
  Administered 2020-12-29 – 2021-01-01 (×4): 40 mg via ORAL
  Filled 2020-12-29 (×4): qty 1

## 2020-12-29 NOTE — Progress Notes (Signed)
   12/29/20 1222  Chest Physiotherapy (CPT)  CPT Delivery Source Flutter valve  CPT Duration 2  Incentive Spirometry  Incentive Spirometry Goal (mL) (RN or RT) 1000 mL  Incentive Spirometry - Achieved (mL) (RN, NT, or RT) 1150 mL  Incentive Spirometry - # of Times (RN or NT) 3  Incentive Spirometry Effort (RN) Satisfactory   Pt educated on use of incentive spirometer and flutter valve. Pt verbalized understanding and demonstrated proper use. Will continue to monitor.

## 2020-12-29 NOTE — Progress Notes (Signed)
Initial Nutrition Assessment  DOCUMENTATION CODES:   Not applicable  INTERVENTION:   -MVI with minerals daily -Ensure Enlive po TID, each supplement provides 350 kcal and 20 grams of protein  -Magic cup TID with meals, each supplement provides 290 kcal and 9 grams of protein   NUTRITION DIAGNOSIS:   Increased nutrient needs related to acute illness (COVID-19) as evidenced by estimated needs.  GOAL:   Patient will meet greater than or equal to 90% of their needs  MONITOR:   PO intake, Supplement acceptance, Labs, Weight trends, Skin, I & O's  REASON FOR ASSESSMENT:   Malnutrition Screening Tool    ASSESSMENT:   [name] is a 86 y.o. male with medical history significant for coronary artery disease status post CABG in 2016, essential hypertension, hyperlipidemia, paroxysmal atrial fibrillation, GERD who presented to [facility]: [name] ED from home due to worsening global weakness associated with worsening productive cough and intermittent nausea and vomiting.  Due to similar symptoms 10 days ago, he took a home COVID screening test which came back positive on 12/18/2020.  His symptomatology was improving until 2 days prior to presentation when he started having chills, fever with Tmax 101.7 at home, nausea, vomiting, diarrhea, and poor appetite.  He presented to the ED via EMS for further evaluation.  In the ED work-up revealed multifocal pneumonia secondary to COVID-19 viral infection and likely superimposed bacterial pulmonary infection.  He was started on COVID-19 directed therapies and Rocephin/IV azithromycin.  Patient is vaccinated against covid, has received 2 doses of Pfizzer vaccine.  TRH, hospitalist team, was asked to admit.  Pt admitted with severe sepsis secondary to pneumonia and COVID infection.   Reviewed I/O's: -480 ml x 24 hours  UOP: 600 ml x 24 hours   Spoke with pt and wife at bedside. Pt with poor oral intake over the past 5 days secondary to COVID. Pet pt wife, pt was  able to tolerate only liquids during this time period secondary to diarrhea ("everything he ate that wasn't a liquid would just come right out"). Pt denies any diarrhea today. Observed meal tray- pt consumed 50% of meat, and 25% of potatoes and corn.   Reviewed wt hx; pt has experienced a 5.4% wt loss over the past 4 months, which is not significant for time frame. Per pt wife, she estimates pt has lost 8 pounds over the past week, however, no wt history available to confirm this.   Discussed importance of good meal and supplement intake to promote healing. Pt amenable to supplements.   Medications reviewed and include vitamin C, vitamin D3, folic acid, thiamine, zinc sulfate, remdesivir, and solu-medrol.   Lab Results  Component Value Date   HGBA1C 6.7 (H) 06/15/2020   PTA DM medications are none.   Labs reviewed: CBGS: 165-182 (inpatient orders for glycemic control are 0-5 units insulin aspart daily at bedtime and 0-9 units insulin aspart TID with meals).    NUTRITION - FOCUSED PHYSICAL EXAM:  Flowsheet Row Most Recent Value  Orbital Region No depletion  Upper Arm Region Mild depletion  Thoracic and Lumbar Region No depletion  Buccal Region No depletion  Temple Region Mild depletion  Clavicle Bone Region Mild depletion  Clavicle and Acromion Bone Region Mild depletion  Scapular Bone Region Mild depletion  Dorsal Hand No depletion  Patellar Region No depletion  Anterior Thigh Region No depletion  Posterior Calf Region No depletion  Edema (RD Assessment) None  Hair Reviewed  Eyes Reviewed  Mouth Reviewed  Skin Reviewed  Nails Reviewed       Diet Order:   Diet Order             Diet heart healthy/carb modified Room service appropriate? No; Fluid consistency: Thin  Diet effective now                   EDUCATION NEEDS:   Education needs have been addressed  Skin:  Skin Assessment: Reviewed RN Assessment  Last BM:  12/28/20  Height:   Ht Readings from Last 1  Encounters:  12/27/20 5\' 7"  (1.702 m)    Weight:   Wt Readings from Last 1 Encounters:  12/27/20 79.4 kg    Ideal Body Weight:  67.3 kg  BMI:  Body mass index is 27.41 kg/m.  Estimated Nutritional Needs:   Kcal:  2200-2400  Protein:  120-135 grams  Fluid:  > 2 L    Loistine Chance, RD, LDN, [place] Registered Dietitian II Certified Diabetes Care and Education Specialist Please refer to [facility] for RD and/or RD on-call/weekend/after hours pager

## 2020-12-29 NOTE — Progress Notes (Signed)
   12/28/20 2342  Assess: MEWS Score  Temp 98.6 F (37 C)  BP (!) 155/95  Pulse Rate 96  Resp (!) 27  SpO2 91 %  O2 Device Nasal Cannula  O2 Flow Rate (L/min) 4 L/min  Assess: MEWS Score  MEWS Temp 0  MEWS Systolic 0  MEWS Pulse 0  MEWS RR 2  MEWS LOC 0  MEWS Score 2  MEWS Score Color Yellow  Assess: if the MEWS score is Yellow or Red  Were vital signs taken at a resting state? Yes  Focused Assessment No change from prior assessment  Early Detection of Sepsis Score *See Row Information* Medium  MEWS guidelines implemented *See Row Information* Yes  Treat  MEWS Interventions Administered scheduled meds/treatments;Escalated (See documentation below);Consulted Respiratory Therapy  Take Vital Signs  Increase Vital Sign Frequency  Yellow: Q 2hr X 2 then Q 4hr X 2, if remains yellow, continue Q 4hrs  Escalate  MEWS: Escalate Yellow: discuss with charge nurse/RN and consider discussing with provider and RRT  Notify: Charge Nurse/RN  Name of Charge Nurse/RN Notified Cobin Cadavid RN  Date Charge Nurse/RN Notified 12/29/20  Time Charge Nurse/RN Notified 2342  Document  Patient Outcome Not stable and remains on department  Progress note created (see row info) Yes

## 2020-12-29 NOTE — Progress Notes (Signed)
Progress Note    [name]   BHA:[id number]  DOB: 08/30/1934  DOA: 12/27/2020     1  PCP: Jinny Sanders, MD  CC: cough, malaise  Hospital Course: [name] is a 86 y.o. 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   Diagnosed with COVID 10 days PTA, was dealing with some mild cough and weakness, but was getting better until the last few days, started to develop worsening cough, malaise.     He developed fever at home followed by nausea, vomiting, worsening weakness and presented to the ER. CXR revealed multifocal pneumonia and admission blood cultures also became positive for strep pneumonia.  Interval History:  Wife bedside this morning.  Patient has difficulty with hearing but understands with loud talking.  Wife states he does appear improved compared to admission and understands ongoing work-up still being done for underlying bacteremia.  ROS: Constitutional: negative for chills and fevers, Respiratory: positive for cough, negative for wheezing, Cardiovascular: negative for chest pain, and Gastrointestinal: negative for abdominal pain  Assessment & Plan: Severe sepsis due to Strep pneumonia bacteremia and CAP Presented with tachcyardia, fever, tachypnea and acute respiratory failure. -Continue Rocephin - lactic downtrending; okay to stop trending - repeat blood culture on 6/21; if still positive may need echo   COVID Acute hypoxic respiratory failure Resolving, incidental.  - not on O2 at home and admitted with O2 demand 3L - was started on remdesivir on admission - steroids appropriate in setting of severe sepsis/CAP and/or b/c of Covid - PE still on differential    Coronary disease Hypertension BP elevated - Continue home regimen  Paroxysmal atrial fibrillation -Not on anticoagulation - Rate controlled currently  DMII - last A1c 6.7% 06/15/20 - continue SSI - may need temporary lantus if levels  rise due to steroid use   Hyponatremia Mild, asymptomatic   Hypokalemia Potassium supplemented    Old records reviewed in assessment of this patient  Antimicrobials: Rocephin 12/27/2020 >> current  DVT prophylaxis: enoxaparin (LOVENOX) injection 40 mg Start: 12/28/20 1000   Code Status:   Code Status: Full Code Family Communication: Wife  Disposition Plan: Status is: Inpatient  Remains inpatient appropriate because:Ongoing diagnostic testing needed not appropriate for outpatient work up, IV treatments appropriate due to intensity of illness or inability to take PO, and Inpatient level of care appropriate due to severity of illness  Dispo:  Patient From: Home  Planned Disposition: Home  Medically stable for discharge: No        Risk of unplanned readmission score: Unplanned Admission- Pilot do not use: 16.08   Objective: Blood pressure (!) 159/98, pulse 92, temperature 98.1 F (36.7 C), temperature source Oral, resp. rate 18, height 5\' 7"  (1.702 m), weight 79.4 kg, SpO2 93 %.  Examination: General appearance: alert, cooperative, and no distress Head: Normocephalic, without obvious abnormality, atraumatic Eyes:  EOMI Lungs:  Coarse breath sounds bilaterally, no wheezing Heart: regular rate and rhythm and S1, S2 normal Abdomen: normal findings: bowel sounds normal and soft, non-tender Extremities:  no edema Skin: mobility and turgor normal Neurologic: Grossly normal  Consultants:    Procedures:    Data Reviewed: I have personally reviewed following labs and imaging studies Results for orders placed or performed during the hospital encounter of 12/27/20 (from the past 24 hour(s))  Lactic acid, plasma     Status: Abnormal   Collection Time: 12/28/20  4:00 PM  Result Value  Ref Range   Lactic Acid, Venous 3.0 (HH) 0.5 - 1.9 mmol/L  Glucose, capillary     Status: Abnormal   Collection Time: 12/28/20  5:30 PM  Result Value Ref Range   Glucose-Capillary 192 (H)  70 - 99 mg/dL  MRSA Next Gen by PCR, Nasal     Status: None   Collection Time: 12/28/20  6:49 PM   Specimen: Nasal Mucosa; Nasal Swab  Result Value Ref Range   MRSA by PCR Next Gen NOT DETECTED NOT DETECTED  Lactic acid, plasma     Status: Abnormal   Collection Time: 12/28/20  7:50 PM  Result Value Ref Range   Lactic Acid, Venous 3.1 (HH) 0.5 - 1.9 mmol/L  Glucose, capillary     Status: Abnormal   Collection Time: 12/28/20  8:32 PM  Result Value Ref Range   Glucose-Capillary 171 (H) 70 - 99 mg/dL  CBC with Differential/Platelet     Status: Abnormal   Collection Time: 12/29/20 12:24 AM  Result Value Ref Range   WBC 5.4 4.0 - 10.5 K/uL   RBC 4.01 (L) 4.22 - 5.81 MIL/uL   Hemoglobin 11.8 (L) 13.0 - 17.0 g/dL   HCT 34.2 (L) 39.0 - 52.0 %   MCV 85.3 80.0 - 100.0 fL   MCH 29.4 26.0 - 34.0 pg   MCHC 34.5 30.0 - 36.0 g/dL   RDW 13.6 11.5 - 15.5 %   Platelets 207 150 - 400 K/uL   nRBC 0.0 0.0 - 0.2 %   Neutrophils Relative % 90 %   Neutro Abs 4.9 1.7 - 7.7 K/uL   Lymphocytes Relative 6 %   Lymphs Abs 0.4 (L) 0.7 - 4.0 K/uL   Monocytes Relative 3 %   Monocytes Absolute 0.2 0.1 - 1.0 K/uL   Eosinophils Relative 0 %   Eosinophils Absolute 0.0 0.0 - 0.5 K/uL   Basophils Relative 0 %   Basophils Absolute 0.0 0.0 - 0.1 K/uL   WBC Morphology VACUOLATED NEUTROPHILS    Immature Granulocytes 1 %   Abs Immature Granulocytes 0.03 0.00 - 0.07 K/uL  Comprehensive metabolic panel     Status: Abnormal   Collection Time: 12/29/20 12:24 AM  Result Value Ref Range   Sodium 136 135 - 145 mmol/L   Potassium 3.6 3.5 - 5.1 mmol/L   Chloride 104 98 - 111 mmol/L   CO2 24 22 - 32 mmol/L   Glucose, Bld 196 (H) 70 - 99 mg/dL   BUN 19 8 - 23 mg/dL   Creatinine, Ser 0.83 0.61 - 1.24 mg/dL   Calcium 8.2 (L) 8.9 - 10.3 mg/dL   Total Protein 5.3 (L) 6.5 - 8.1 g/dL   Albumin 2.3 (L) 3.5 - 5.0 g/dL   AST 25 15 - 41 U/L   ALT 18 0 - 44 U/L   Alkaline Phosphatase 34 (L) 38 - 126 U/L   Total Bilirubin 0.9  0.3 - 1.2 mg/dL   GFR, Estimated >60 >60 mL/min   Anion gap 8 5 - 15  Lactic acid, plasma     Status: Abnormal   Collection Time: 12/29/20 12:24 AM  Result Value Ref Range   Lactic Acid, Venous 2.7 (HH) 0.5 - 1.9 mmol/L  Glucose, capillary     Status: Abnormal   Collection Time: 12/29/20  7:59 AM  Result Value Ref Range   Glucose-Capillary 182 (H) 70 - 99 mg/dL  Lactic acid, plasma     Status: Abnormal   Collection Time: 12/29/20 11:28 AM  Result Value Ref Range   Lactic Acid, Venous 2.3 (HH) 0.5 - 1.9 mmol/L  Glucose, capillary     Status: Abnormal   Collection Time: 12/29/20 11:28 AM  Result Value Ref Range   Glucose-Capillary 165 (H) 70 - 99 mg/dL    Recent Results (from the past 240 hour(s))  Blood Culture (routine x 2)     Status: Abnormal (Preliminary result)   Collection Time: 12/27/20 10:30 PM   Specimen: BLOOD  Result Value Ref Range Status   Specimen Description BLOOD RIGHT ANTECUBITAL  Final   Special Requests   Final    BOTTLES DRAWN AEROBIC AND ANAEROBIC Blood Culture adequate volume   Culture  Setup Time   Final    GRAM POSITIVE COCCI IN BOTH AEROBIC AND ANAEROBIC BOTTLES CRITICAL VALUE NOTED.  VALUE IS CONSISTENT WITH PREVIOUSLY REPORTED AND CALLED VALUE. Performed at [facility], [place] [address], [place], [place] [address]    Culture STREPTOCOCCUS PNEUMONIAE (A)  Final   Report Status PENDING  Incomplete  Blood Culture (routine x 2)     Status: Abnormal (Preliminary result)   Collection Time: 12/27/20 10:40 PM   Specimen: BLOOD RIGHT HAND  Result Value Ref Range Status   Specimen Description BLOOD RIGHT HAND  Final   Special Requests   Final    BOTTLES DRAWN AEROBIC AND ANAEROBIC Blood Culture results may not be optimal due to an inadequate volume of blood received in culture bottles   Culture  Setup Time   Final    GRAM POSITIVE COCCI IN BOTH AEROBIC AND ANAEROBIC BOTTLES CRITICAL RESULT CALLED TO, READ BACK BY AND VERIFIED WITH: PHARMD L CHEN 1710  [id number] FCP    Culture (A)  Final    STREPTOCOCCUS PNEUMONIAE SUSCEPTIBILITIES TO FOLLOW Performed at [facility], [place] [address] [place], [place] [address]    Report Status PENDING  Incomplete  Blood Culture ID Panel (Reflexed)     Status: Abnormal   Collection Time: 12/27/20 10:40 PM  Result Value Ref Range Status   Enterococcus faecalis NOT DETECTED NOT DETECTED Final   Enterococcus Faecium NOT DETECTED NOT DETECTED Final   Listeria monocytogenes NOT DETECTED NOT DETECTED Final   Staphylococcus species NOT DETECTED NOT DETECTED Final   Staphylococcus aureus (BCID) NOT DETECTED NOT DETECTED Final   Staphylococcus epidermidis NOT DETECTED NOT DETECTED Final   Staphylococcus lugdunensis NOT DETECTED NOT DETECTED Final   Streptococcus species DETECTED (A) NOT DETECTED Final    Comment: CRITICAL RESULT CALLED TO, READ BACK BY AND VERIFIED WITH: PHARMD L CHEN 1710 [id number] FCP    Streptococcus agalactiae NOT DETECTED NOT DETECTED Final   Streptococcus pneumoniae DETECTED (A) NOT DETECTED Final    Comment: CRITICAL RESULT CALLED TO, READ BACK BY AND VERIFIED WITH: PHARMD L CHEN 1710 [id number] FCP    Streptococcus pyogenes NOT DETECTED NOT DETECTED Final   A.calcoaceticus-baumannii NOT DETECTED NOT DETECTED Final   Bacteroides fragilis NOT DETECTED NOT DETECTED Final   Enterobacterales NOT DETECTED NOT DETECTED Final   Enterobacter cloacae complex NOT DETECTED NOT DETECTED Final   Escherichia coli NOT DETECTED NOT DETECTED Final   Klebsiella aerogenes NOT DETECTED NOT DETECTED Final   Klebsiella oxytoca NOT DETECTED NOT DETECTED Final   Klebsiella pneumoniae NOT DETECTED NOT DETECTED Final   Proteus species NOT DETECTED NOT DETECTED Final   Salmonella species NOT DETECTED NOT DETECTED Final   Serratia marcescens NOT DETECTED NOT DETECTED Final   Haemophilus influenzae NOT DETECTED NOT DETECTED Final  Neisseria meningitidis NOT DETECTED NOT DETECTED Final   Pseudomonas  aeruginosa NOT DETECTED NOT DETECTED Final   Stenotrophomonas maltophilia NOT DETECTED NOT DETECTED Final   Candida albicans NOT DETECTED NOT DETECTED Final   Candida auris NOT DETECTED NOT DETECTED Final   Candida glabrata NOT DETECTED NOT DETECTED Final   Candida krusei NOT DETECTED NOT DETECTED Final   Candida parapsilosis NOT DETECTED NOT DETECTED Final   Candida tropicalis NOT DETECTED NOT DETECTED Final   Cryptococcus neoformans/gattii NOT DETECTED NOT DETECTED Final    Comment: Performed at [facility], [place] [address], [place], [place] [address]  Resp Panel by RT-PCR (Flu A&B, Covid) Nasopharyngeal Swab     Status: Abnormal   Collection Time: 12/27/20 10:58 PM   Specimen: Nasopharyngeal Swab; Nasopharyngeal(NP) swabs in vial transport medium  Result Value Ref Range Status   SARS Coronavirus 2 by RT PCR POSITIVE (A) NEGATIVE Final    Comment: RESULT CALLED TO, READ BACK BY AND VERIFIED WITH: RN TATE GRACE BY MESSAN H. AT 2358 ON 6 19 2022 (NOTE) SARS-CoV-2 target nucleic acids are DETECTED.  The SARS-CoV-2 RNA is generally detectable in upper respiratory specimens during the acute phase of infection. Positive results are indicative of the presence of the identified virus, but do not rule out bacterial infection or co-infection with other pathogens not detected by the test. Clinical correlation with patient history and other diagnostic information is necessary to determine patient infection status. The expected result is Negative.  Fact Sheet for Patients: EntrepreneurPulse.com.au  Fact Sheet for Healthcare Providers: IncredibleEmployment.be  This test is not yet approved or cleared by the [place] FDA and  has been authorized for detection and/or diagnosis of SARS-CoV-2 by FDA under an Emergency Use Authorization (EUA).  This EUA will remain in effect (meaning th is test can be used) for the duration of  the COVID-19  declaration under Section 564(b)(1) of the Act, 21 U.S.C. section 360bbb-3(b)(1), unless the authorization is terminated or revoked sooner.     Influenza A by PCR NEGATIVE NEGATIVE Final   Influenza B by PCR NEGATIVE NEGATIVE Final    Comment: (NOTE) The Xpert Xpress SARS-CoV-2/FLU/RSV plus assay is intended as an aid in the diagnosis of influenza from Nasopharyngeal swab specimens and should not be used as a sole basis for treatment. Nasal washings and aspirates are unacceptable for Xpert Xpress SARS-CoV-2/FLU/RSV testing.  Fact Sheet for Patients: EntrepreneurPulse.com.au  Fact Sheet for Healthcare Providers: IncredibleEmployment.be  This test is not yet approved or cleared by the [place] FDA and has been authorized for detection and/or diagnosis of SARS-CoV-2 by FDA under an Emergency Use Authorization (EUA). This EUA will remain in effect (meaning this test can be used) for the duration of the COVID-19 declaration under Section 564(b)(1) of the Act, 21 U.S.C. section 360bbb-3(b)(1), unless the authorization is terminated or revoked.  Performed at [facility], [place] [address] [place], [place] [address]   MRSA Next Gen by PCR, Nasal     Status: None   Collection Time: 12/28/20  6:49 PM   Specimen: Nasal Mucosa; Nasal Swab  Result Value Ref Range Status   MRSA by PCR Next Gen NOT DETECTED NOT DETECTED Final    Comment: (NOTE) The GeneXpert MRSA Assay (FDA approved for NASAL specimens only), is one component of a comprehensive MRSA colonization surveillance program. It is not intended to diagnose MRSA infection nor to guide or monitor treatment for MRSA infections. Test performance is not FDA approved  in patients less than 45 years old. Performed at [facility], [place] [address] [place], [place] [address]      Radiology Studies: DG Chest Port 1 View  Result Date: 12/27/2020 CLINICAL DATA:  Hypoxia and cough,  COVID-19 positivity EXAM: PORTABLE CHEST 1 VIEW COMPARISON:  04/21/2016 FINDINGS: Cardiac shadow is prominent but stable. Large hiatal hernia is again identified. Postsurgical changes are seen. Patchy airspace opacities are noted in the bases bilaterally likely related to the patient's given clinical history. No bony abnormality is noted. IMPRESSION: Patchy bibasilar airspace opacities consistent with the given clinical history. Electronically Signed   By: Inez Catalina M.D.   On: 12/27/2020 22:45   DG Chest Port 1 View  Final Result      Scheduled Meds:  vitamin C  500 mg Oral Daily   cholecalciferol  1,000 Units Oral Daily   clopidogrel  75 mg Oral Daily   enoxaparin (LOVENOX) injection  40 mg Subcutaneous Daily   feeding supplement  237 mL Oral TID BM   folic acid  1 mg Oral Daily   insulin aspart  0-5 Units Subcutaneous QHS   insulin aspart  0-9 Units Subcutaneous TID WC   Ipratropium-Albuterol  1 puff Inhalation Q6H   isosorbide mononitrate  30 mg Oral Daily   mouth rinse  15 mL Mouth Rinse BID   methylPREDNISolone (SOLU-MEDROL) injection  40 mg Intravenous BID   multivitamin with minerals  1 tablet Oral Daily   rosuvastatin  5 mg Oral Daily   thiamine  100 mg Oral Daily   zinc sulfate  220 mg Oral Daily   PRN Meds: acetaminophen, guaiFENesin-dextromethorphan, loperamide, prochlorperazine Continuous Infusions:  cefTRIAXone (ROCEPHIN)  IV 2 g (12/28/20 2206)   remdesivir 100 mg in NS 100 mL 100 mg (12/29/20 0812)     LOS: 1 day  Time spent: Greater than 50% of the 35 minute visit was spent in counseling/coordination of care for the patient as laid out in the A&P.   Dwyane Dee, MD [facility] 12/29/2020, 3:14 PM

## 2020-12-30 ENCOUNTER — Encounter (HOSPITAL_COMMUNITY): Payer: Self-pay | Admitting: Internal Medicine

## 2020-12-30 DIAGNOSIS — B955 Unspecified streptococcus as the cause of diseases classified elsewhere: Secondary | ICD-10-CM

## 2020-12-30 DIAGNOSIS — J9601 Acute respiratory failure with hypoxia: Secondary | ICD-10-CM

## 2020-12-30 DIAGNOSIS — R7881 Bacteremia: Secondary | ICD-10-CM

## 2020-12-30 LAB — COMPREHENSIVE METABOLIC PANEL
ALT: 38 U/L (ref 0–44)
AST: 46 U/L — ABNORMAL HIGH (ref 15–41)
Albumin: 2.3 g/dL — ABNORMAL LOW (ref 3.5–5.0)
Alkaline Phosphatase: 41 U/L (ref 38–126)
Anion gap: 10 (ref 5–15)
BUN: 21 mg/dL (ref 8–23)
CO2: 23 mmol/L (ref 22–32)
Calcium: 8.5 mg/dL — ABNORMAL LOW (ref 8.9–10.3)
Chloride: 101 mmol/L (ref 98–111)
Creatinine, Ser: 0.65 mg/dL (ref 0.61–1.24)
GFR, Estimated: >60 mL/min (ref >60)
Glucose, Bld: 185 mg/dL — ABNORMAL HIGH (ref 70–99)
Potassium: 3.9 mmol/L (ref 3.5–5.1)
Sodium: 134 mmol/L — ABNORMAL LOW (ref 135–145)
Total Bilirubin: 0.5 mg/dL (ref 0.3–1.2)
Total Protein: 5.3 g/dL — ABNORMAL LOW (ref 6.5–8.1)

## 2020-12-30 LAB — CBC WITH DIFFERENTIAL/PLATELET
Abs Immature Granulocytes: 0.05 10*3/uL (ref 0.00–0.07)
Basophils Absolute: 0 10*3/uL (ref 0.0–0.1)
Basophils Relative: 0 %
Eosinophils Absolute: 0 10*3/uL (ref 0.0–0.5)
Eosinophils Relative: 0 %
HCT: 34.7 % — ABNORMAL LOW (ref 39.0–52.0)
Hemoglobin: 11.8 g/dL — ABNORMAL LOW (ref 13.0–17.0)
Immature Granulocytes: 1 %
Lymphocytes Relative: 7 %
Lymphs Abs: 0.4 10*3/uL — ABNORMAL LOW (ref 0.7–4.0)
MCH: 29.4 pg (ref 26.0–34.0)
MCHC: 34 g/dL (ref 30.0–36.0)
MCV: 86.3 fL (ref 80.0–100.0)
Monocytes Absolute: 0.3 10*3/uL (ref 0.1–1.0)
Monocytes Relative: 6 %
Neutro Abs: 5.4 10*3/uL (ref 1.7–7.7)
Neutrophils Relative %: 86 %
Platelets: 204 10*3/uL (ref 150–400)
RBC: 4.02 MIL/uL — ABNORMAL LOW (ref 4.22–5.81)
RDW: 13.7 % (ref 11.5–15.5)
WBC: 6.2 10*3/uL (ref 4.0–10.5)
nRBC: 0 % (ref 0.0–0.2)

## 2020-12-30 LAB — CULTURE, BLOOD (ROUTINE X 2): Special Requests: ADEQUATE

## 2020-12-30 LAB — MAGNESIUM: Magnesium: 1.9 mg/dL (ref 1.7–2.4)

## 2020-12-30 LAB — D-DIMER, QUANTITATIVE: D-Dimer, Quant: 3.58 ug/mL-FEU — ABNORMAL HIGH (ref 0.00–0.50)

## 2020-12-30 LAB — GLUCOSE, CAPILLARY
Glucose-Capillary: 203 mg/dL — ABNORMAL HIGH (ref 70–99)
Glucose-Capillary: 281 mg/dL — ABNORMAL HIGH (ref 70–99)
Glucose-Capillary: 278 mg/dL — ABNORMAL HIGH (ref 70–99)
Glucose-Capillary: 272 mg/dL — ABNORMAL HIGH (ref 70–99)

## 2020-12-30 LAB — C-REACTIVE PROTEIN: CRP: 18.3 mg/dL — ABNORMAL HIGH (ref <1.0)

## 2020-12-30 MED ORDER — SODIUM CHLORIDE 0.9 % IV SOLN: INTRAVENOUS | Status: DC | PRN

## 2020-12-30 NOTE — Progress Notes (Signed)
PT Cancellation Note  Patient Details Name: MAY OZMENT MRN: [id number] DOB: 30-Sep-1934   Cancelled Treatment:    Reason Eval/Treat Not Completed: Other (comment) patient triaged due to time restrictions of therapist schedule. Paged out to therapy team in attempt to get patient evaluated today.   Windell Norfolk, DPT, PN1   Supplemental Physical Therapist [facility]    Pager [phone] Acute Rehab Office [phone]

## 2020-12-30 NOTE — Progress Notes (Signed)
PROGRESS NOTE                                                                                                                                                                                                             Patient Demographics:    [name], is a 86 y.o. male, DOB - 02/26/1935, ZCH:[id number]  Outpatient Primary MD for the patient is Jinny Sanders, MD   Admit date - 12/27/2020   LOS - 2  Chief Complaint  Patient presents with   Fatigue   Covid Positive       Brief Narrative: Patient is a 86 y.o. male with PMHx of CAD s/p CABG in 2016, HTN, HLD, PAF-who was diagnosed with COVID-19 on 6/10 (home test)-presented to the ED on 6/16 with worsening cough/shortness of breath-he was found to have superimposed bacterial pneumonia (elevated procalcitonin level)-and was subsequently admitted to the hospitalist service  COVID-19 vaccinated status: Vaccinated including booster  Significant Events: 6/19>> Admit to [facility] for presumed superimposed bacterial pneumonia on COVID-19 infection  Significant studies: 6/19>>Chest x-ray: Patchy bilateral airspace opacities.  COVID-19 medications: Steroids: 6/19>> Remdesivir: 6/19>>  Antibiotics: Rocephin: 6/19>> Zithromax: 6/19 x 1  Microbiology data: 6/19>> blood culture: Streptococcus pneumoniae 6/21 >>blood culture: No growth  Procedures: None  Consults: None  DVT prophylaxis: enoxaparin (LOVENOX) injection 40 mg Start: 12/28/20 1000   Subjective:    [name] today was lying comfortably in bed-no major issues overnight.  Stable on just 2 L of oxygen.  He is hard of hearing.   Assessment  & Plan :   Severe sepsis due to pneumococcal pneumonia with bacteremia: Sepsis physiology rapidly improving-remains on IV antibiotics.  Repeat blood culture on 6/21 negative so far.  Acute Hypoxic Resp Failure due to pneumococcal pneumonia +/- COVID pneumonia: Suspect  that this is predominantly pneumococcal pneumonia causing hypoxemic respiratory failure-however unable to rule out some component of COVID-19 pneumonia as well.  Hypoxia is mild-he is stable on just 2 L of oxygen.  Remains on steroid/Remdesivir/Rocephin.  Continue attempts to encourage incentive spirometry/mobilize with nursing//PT.  O2 requirements:  SpO2: 95 % O2 Flow Rate (L/min): 2 L/min   COVID-19 Labs: Recent Labs    12/27/20 2155 12/30/20 0120  DDIMER 6.05* 3.58*  FERRITIN 339*  --   LDH 178  --   CRP 41.5* 18.3*  Elevated D-dimer: Downtrending-likely due to COVID-19-hypoxia appears to be mild-we will check lower extremity Dopplers and trend D-dimer.  HTN: BP controlled-continue amlodipine, Imdur, lisinopril  CAD s/p four-vessel CABG 2016: No anginal symptoms-continue Plavix, statin.  No longer on beta-blocker given prior history of second-degree AV block/junctional bradycardia (noted in 48-hour Holter 2019) per review of outpatient cardiology note.  PAF: Probably provoked by pneumonia-no history of CVA-Echo in 2021 stable-obtain updated echo-recheck EKG in a.m.-if continues to stay in A. fib-May need to consider anticoagulation.  HLD: On statin  History of junctional bradycardia/second-degree AV block: Noted in 48-hour Holter-in 2019-continue telemetry monitoring-avoid rate control agents.  DM-2 (A1c 6.7 on 06/15/2020) with uncontrolled hyperglycemia due to steroids: Continue CBG monitoring with SSI-suspect CBGs will improve further as steroids get tapered down.  Recent Labs    12/29/20 2058 12/30/20 0825 12/30/20 1157  GLUCAP 260* 203* 281*       ABG:    Component Value Date/Time   PHART 7.371 11/04/2014 0013   PCO2ART 35.4 11/04/2014 0013   PO2ART 100.0 11/04/2014 0013   HCO3 20.4 11/04/2014 0013   TCO2 22 04/19/2016 1856   ACIDBASEDEF 4.0 (H) 11/04/2014 0013   O2SAT 97.0 11/04/2014 0013    Vent Settings: N/A    Condition - Stable  Family  Communication  :  Spouse at bedside  Code Status :  Full Code  Diet :  Diet Order             Diet heart healthy/carb modified Room service appropriate? No; Fluid consistency: Thin  Diet effective now                    Disposition Plan  :   Status is: Inpatient  Remains inpatient appropriate because:Inpatient level of care appropriate due to severity of illness  Dispo:  Patient From: Home  Planned Disposition: Home  Medically stable for discharge: No      Barriers to discharge: Hypoxia requiring O2 supplementation/complete 5 days of IV Remdesivir  Antimicorbials  :    Anti-infectives (From admission, onward)    Start     Dose/Rate Route Frequency Ordered Stop   12/29/20 1000  remdesivir 100 mg in sodium chloride 0.9 % 100 mL IVPB       See Hyperspace for full Linked Orders Report.   100 mg 200 mL/hr over 30 Minutes Intravenous Daily 12/28/20 0016 01/02/21 0959   12/28/20 2245  cefTRIAXone (ROCEPHIN) 2 g in sodium chloride 0.9 % 100 mL IVPB        2 g 200 mL/hr over 30 Minutes Intravenous Every 24 hours 12/28/20 2159     12/28/20 2200  azithromycin (ZITHROMAX) 500 mg in sodium chloride 0.9 % 250 mL IVPB  Status:  Discontinued        500 mg 250 mL/hr over 60 Minutes Intravenous Every 24 hours 12/28/20 0016 12/28/20 1749   12/28/20 2200  cefTRIAXone (ROCEPHIN) 1 g in sodium chloride 0.9 % 100 mL IVPB  Status:  Discontinued        1 g 200 mL/hr over 30 Minutes Intravenous Every 24 hours 12/28/20 0016 12/28/20 2159   12/28/20 0200  remdesivir 200 mg in sodium chloride 0.9% 250 mL IVPB       See Hyperspace for full Linked Orders Report.   200 mg 580 mL/hr over 30 Minutes Intravenous Once 12/28/20 0016 12/28/20 0332   12/27/20 2200  cefTRIAXone (ROCEPHIN) 1 g in sodium chloride 0.9 % 100 mL IVPB  1 g 200 mL/hr over 30 Minutes Intravenous  Once 12/27/20 2156 12/27/20 2348   12/27/20 2200  azithromycin (ZITHROMAX) 500 mg in sodium chloride 0.9 % 250 mL IVPB         500 mg 250 mL/hr over 60 Minutes Intravenous  Once 12/27/20 2156 12/27/20 2348       Inpatient Medications  Scheduled Meds:  amLODipine  5 mg Oral Daily   vitamin C  500 mg Oral Daily   cholecalciferol  1,000 Units Oral Daily   clopidogrel  75 mg Oral Daily   enoxaparin (LOVENOX) injection  40 mg Subcutaneous Daily   feeding supplement  237 mL Oral TID BM   folic acid  1 mg Oral Daily   insulin aspart  0-5 Units Subcutaneous QHS   insulin aspart  0-9 Units Subcutaneous TID WC   Ipratropium-Albuterol  1 puff Inhalation Q6H   isosorbide mononitrate  30 mg Oral Daily   lisinopril  40 mg Oral Daily   mouth rinse  15 mL Mouth Rinse BID   methylPREDNISolone (SOLU-MEDROL) injection  40 mg Intravenous BID   multivitamin with minerals  1 tablet Oral Daily   rosuvastatin  5 mg Oral Daily   thiamine  100 mg Oral Daily   zinc sulfate  220 mg Oral Daily   Continuous Infusions:  cefTRIAXone (ROCEPHIN)  IV 2 g (12/29/20 2133)   remdesivir 100 mg in NS 100 mL 100 mg (12/30/20 0909)   PRN Meds:.acetaminophen, guaiFENesin-dextromethorphan, loperamide, prochlorperazine   Time Spent in minutes  35   See all Orders from today for further details   Oren Binet M.D on 12/30/2020 at 3:24 PM  To page go to [email or web address] - use universal password  [facility] -  Office  [phone]    Objective:   Vitals:   12/30/20 0100 12/30/20 0700 12/30/20 0912 12/30/20 1151  BP: (!) 157/106 (!) 182/95  (!) 143/83  Pulse: 84 85  83  Resp: 19 18  18   Temp: 98 F (36.7 C) 97.7 F (36.5 C)  97.6 F (36.4 C)  TempSrc: Oral Oral  Oral  SpO2: 93% 95% 92% 95%  Weight:      Height:        Wt Readings from Last 3 Encounters:  12/27/20 79.4 kg  09/04/20 83.9 kg  06/23/20 83.9 kg     Intake/Output Summary (Last 24 hours) at 12/30/2020 1524 Last data filed at 12/30/2020 0600 Gross per 24 hour  Intake 205 ml  Output 875 ml  Net -670 ml     Physical Exam Gen Exam:Alert  awake-not in any distress HEENT:atraumatic, normocephalic Chest: B/L clear to auscultation anteriorly CVS:S1S2 regular Abdomen:soft non tender, non distended Extremities:no edema Neurology: Non focal Skin: no rash   Data Review:    CBC Recent Labs  Lab 12/27/20 2155 12/29/20 0024 12/30/20 0120  WBC 4.7 5.4 6.2  HGB 13.8 11.8* 11.8*  HCT 41.6 34.2* 34.7*  PLT 175 207 204  MCV 88.3 85.3 86.3  MCH 29.3 29.4 29.4  MCHC 33.2 34.5 34.0  RDW 13.5 13.6 13.7  LYMPHSABS 0.4* 0.4* 0.4*  MONOABS 0.1 0.2 0.3  EOSABS 0.0 0.0 0.0  BASOSABS 0.0 0.0 0.0    Chemistries  Recent Labs  Lab 12/27/20 2155 12/29/20 0024 12/30/20 0120  NA 134* 136 134*  K 3.4* 3.6 3.9  CL 103 104 101  CO2 20* 24 23  GLUCOSE 151* 196* 185*  BUN 26* 19 21  CREATININE 1.00 0.83 0.65  CALCIUM 8.7* 8.2* 8.5*  MG  --   --  1.9  AST 25 25 46*  ALT 19 18 38  ALKPHOS 37* 34* 41  BILITOT 1.3* 0.9 0.5   ------------------------------------------------------------------------------------------------------------------ Recent Labs    12/27/20 2155  TRIG 111    Lab Results  Component Value Date   HGBA1C 6.7 (H) 06/15/2020   ------------------------------------------------------------------------------------------------------------------ No results for input(s): TSH, T4TOTAL, T3FREE, THYROIDAB in the last 72 hours.  Invalid input(s): FREET3 ------------------------------------------------------------------------------------------------------------------ Recent Labs    12/27/20 2155  FERRITIN 339*    Coagulation profile No results for input(s): INR, PROTIME in the last 168 hours.  Recent Labs    12/27/20 2155 12/30/20 0120  DDIMER 6.05* 3.58*    Cardiac Enzymes No results for input(s): CKMB, TROPONINI, MYOGLOBIN in the last 168 hours.  Invalid input(s): CK ------------------------------------------------------------------------------------------------------------------ No results found  for: BNP  Micro Results Recent Results (from the past 240 hour(s))  Blood Culture (routine x 2)     Status: Abnormal   Collection Time: 12/27/20 10:30 PM   Specimen: BLOOD  Result Value Ref Range Status   Specimen Description BLOOD RIGHT ANTECUBITAL  Final   Special Requests   Final    BOTTLES DRAWN AEROBIC AND ANAEROBIC Blood Culture adequate volume   Culture  Setup Time   Final    GRAM POSITIVE COCCI IN BOTH AEROBIC AND ANAEROBIC BOTTLES CRITICAL VALUE NOTED.  VALUE IS CONSISTENT WITH PREVIOUSLY REPORTED AND CALLED VALUE.    Culture (A)  Final    STREPTOCOCCUS PNEUMONIAE SUSCEPTIBILITIES PERFORMED ON PREVIOUS CULTURE WITHIN THE LAST 5 DAYS. Performed at [facility], [place] [address] [place], [place] [address]    Report Status 12/30/2020 FINAL  Final  Blood Culture (routine x 2)     Status: Abnormal   Collection Time: 12/27/20 10:40 PM   Specimen: BLOOD RIGHT HAND  Result Value Ref Range Status   Specimen Description BLOOD RIGHT HAND  Final   Special Requests   Final    BOTTLES DRAWN AEROBIC AND ANAEROBIC Blood Culture results may not be optimal due to an inadequate volume of blood received in culture bottles   Culture  Setup Time   Final    GRAM POSITIVE COCCI IN BOTH AEROBIC AND ANAEROBIC BOTTLES CRITICAL RESULT CALLED TO, READ BACK BY AND VERIFIED WITH: Amada Kingfisher CHEN [id number] FCP Performed at [facility], [place] [address], [place], [place] [address]    Culture STREPTOCOCCUS PNEUMONIAE (A)  Final   Report Status 12/30/2020 FINAL  Final   Organism ID, Bacteria STREPTOCOCCUS PNEUMONIAE  Final      Susceptibility   Streptococcus pneumoniae - MIC*    ERYTHROMYCIN <=0.12 SENSITIVE Sensitive     LEVOFLOXACIN 1 SENSITIVE Sensitive     VANCOMYCIN 0.5 SENSITIVE Sensitive     PENICILLIN (meningitis) <=0.06 SENSITIVE Sensitive     PENO - penicillin <=0.06      PENICILLIN (non-meningitis) <=0.06 SENSITIVE Sensitive     PENICILLIN (oral) <=0.06 SENSITIVE Sensitive      CEFTRIAXONE (non-meningitis) <=0.12 SENSITIVE Sensitive     CEFTRIAXONE (meningitis) <=0.12 SENSITIVE Sensitive     * STREPTOCOCCUS PNEUMONIAE  Blood Culture ID Panel (Reflexed)     Status: Abnormal   Collection Time: 12/27/20 10:40 PM  Result Value Ref Range Status   Enterococcus faecalis NOT DETECTED NOT DETECTED Final   Enterococcus Faecium NOT DETECTED NOT DETECTED Final   Listeria monocytogenes NOT DETECTED NOT DETECTED Final   Staphylococcus species NOT DETECTED NOT  DETECTED Final   Staphylococcus aureus (BCID) NOT DETECTED NOT DETECTED Final   Staphylococcus epidermidis NOT DETECTED NOT DETECTED Final   Staphylococcus lugdunensis NOT DETECTED NOT DETECTED Final   Streptococcus species DETECTED (A) NOT DETECTED Final    Comment: CRITICAL RESULT CALLED TO, READ BACK BY AND VERIFIED WITH: PHARMD L CHEN [phone] FCP    Streptococcus agalactiae NOT DETECTED NOT DETECTED Final   Streptococcus pneumoniae DETECTED (A) NOT DETECTED Final    Comment: CRITICAL RESULT CALLED TO, READ BACK BY AND VERIFIED WITH: PHARMD L CHEN [id number] FCP    Streptococcus pyogenes NOT DETECTED NOT DETECTED Final   A.calcoaceticus-baumannii NOT DETECTED NOT DETECTED Final   Bacteroides fragilis NOT DETECTED NOT DETECTED Final   Enterobacterales NOT DETECTED NOT DETECTED Final   Enterobacter cloacae complex NOT DETECTED NOT DETECTED Final   Escherichia coli NOT DETECTED NOT DETECTED Final   Klebsiella aerogenes NOT DETECTED NOT DETECTED Final   Klebsiella oxytoca NOT DETECTED NOT DETECTED Final   Klebsiella pneumoniae NOT DETECTED NOT DETECTED Final   Proteus species NOT DETECTED NOT DETECTED Final   Salmonella species NOT DETECTED NOT DETECTED Final   Serratia marcescens NOT DETECTED NOT DETECTED Final   Haemophilus influenzae NOT DETECTED NOT DETECTED Final   Neisseria meningitidis NOT DETECTED NOT DETECTED Final   Pseudomonas aeruginosa NOT DETECTED NOT DETECTED Final   Stenotrophomonas  maltophilia NOT DETECTED NOT DETECTED Final   Candida albicans NOT DETECTED NOT DETECTED Final   Candida auris NOT DETECTED NOT DETECTED Final   Candida glabrata NOT DETECTED NOT DETECTED Final   Candida krusei NOT DETECTED NOT DETECTED Final   Candida parapsilosis NOT DETECTED NOT DETECTED Final   Candida tropicalis NOT DETECTED NOT DETECTED Final   Cryptococcus neoformans/gattii NOT DETECTED NOT DETECTED Final    Comment: Performed at [facility], [address], [place], [place] [address]  Resp Panel by RT-PCR (Flu A&B, Covid) Nasopharyngeal Swab     Status: Abnormal   Collection Time: 12/27/20 10:58 PM   Specimen: Nasopharyngeal Swab; Nasopharyngeal(NP) swabs in vial transport medium  Result Value Ref Range Status   SARS Coronavirus 2 by RT PCR POSITIVE (A) NEGATIVE Final    Comment: RESULT CALLED TO, READ BACK BY AND VERIFIED WITH: RN TATE GRACE BY MESSAN H. AT 2358 ON 6 19 2022 (NOTE) SARS-CoV-2 target nucleic acids are DETECTED.  The SARS-CoV-2 RNA is generally detectable in upper respiratory specimens during the acute phase of infection. Positive results are indicative of the presence of the identified virus, but do not rule out bacterial infection or co-infection with other pathogens not detected by the test. Clinical correlation with patient history and other diagnostic information is necessary to determine patient infection status. The expected result is Negative.  Fact Sheet for Patients: EntrepreneurPulse.com.au  Fact Sheet for Healthcare Providers: IncredibleEmployment.be  This test is not yet approved or cleared by the [place] FDA and  has been authorized for detection and/or diagnosis of SARS-CoV-2 by FDA under an Emergency Use Authorization (EUA).  This EUA will remain in effect (meaning th is test can be used) for the duration of  the COVID-19 declaration under Section 564(b)(1) of the Act, 21 U.S.C. section  360bbb-3(b)(1), unless the authorization is terminated or revoked sooner.     Influenza A by PCR NEGATIVE NEGATIVE Final   Influenza B by PCR NEGATIVE NEGATIVE Final    Comment: (NOTE) The Xpert Xpress SARS-CoV-2/FLU/RSV plus assay is intended as an aid in the diagnosis of  influenza from Nasopharyngeal swab specimens and should not be used as a sole basis for treatment. Nasal washings and aspirates are unacceptable for Xpert Xpress SARS-CoV-2/FLU/RSV testing.  Fact Sheet for Patients: EntrepreneurPulse.com.au  Fact Sheet for Healthcare Providers: IncredibleEmployment.be  This test is not yet approved or cleared by the [place] FDA and has been authorized for detection and/or diagnosis of SARS-CoV-2 by FDA under an Emergency Use Authorization (EUA). This EUA will remain in effect (meaning this test can be used) for the duration of the COVID-19 declaration under Section 564(b)(1) of the Act, 21 U.S.C. section 360bbb-3(b)(1), unless the authorization is terminated or revoked.  Performed at [facility], [place] [address], [place], [place] [address]   MRSA Next Gen by PCR, Nasal     Status: None   Collection Time: 12/28/20  6:49 PM   Specimen: Nasal Mucosa; Nasal Swab  Result Value Ref Range Status   MRSA by PCR Next Gen NOT DETECTED NOT DETECTED Final    Comment: (NOTE) The GeneXpert MRSA Assay (FDA approved for NASAL specimens only), is one component of a comprehensive MRSA colonization surveillance program. It is not intended to diagnose MRSA infection nor to guide or monitor treatment for MRSA infections. Test performance is not FDA approved in patients less than 43 years old. Performed at [facility], [place] [address] [place], [place] [address]   Culture, blood (routine x 2)     Status: None (Preliminary result)   Collection Time: 12/29/20 11:28 AM   Specimen: BLOOD  Result Value Ref Range Status   Specimen Description  BLOOD LEFT ANTECUBITAL  Final   Special Requests   Final    BOTTLES DRAWN AEROBIC AND ANAEROBIC Blood Culture results may not be optimal due to an inadequate volume of blood received in culture bottles   Culture   Final    NO GROWTH < 24 HOURS Performed at [facility], [place] [address], [place], [place] [address]    Report Status PENDING  Incomplete  Culture, blood (routine x 2)     Status: None (Preliminary result)   Collection Time: 12/29/20 11:28 AM   Specimen: BLOOD  Result Value Ref Range Status   Specimen Description BLOOD LEFT ANTECUBITAL  Final   Special Requests   Final    BOTTLES DRAWN AEROBIC ONLY Blood Culture adequate volume   Culture   Final    NO GROWTH < 24 HOURS Performed at [facility], [place] [address] [place], [place] [address]    Report Status PENDING  Incomplete    Radiology Reports DG Chest Port 1 View  Result Date: 12/27/2020 CLINICAL DATA:  Hypoxia and cough, COVID-19 positivity EXAM: PORTABLE CHEST 1 VIEW COMPARISON:  04/21/2016 FINDINGS: Cardiac shadow is prominent but stable. Large hiatal hernia is again identified. Postsurgical changes are seen. Patchy airspace opacities are noted in the bases bilaterally likely related to the patient's given clinical history. No bony abnormality is noted. IMPRESSION: Patchy bibasilar airspace opacities consistent with the given clinical history. Electronically Signed   By: Inez Catalina M.D.   On: 12/27/2020 22:45

## 2020-12-30 NOTE — Progress Notes (Signed)
Inpatient Diabetes Program Recommendations  AACE/ADA: New Consensus Statement on Inpatient Glycemic Control (2015)  Target Ranges:  Prepandial:   less than 140 mg/dL      Peak postprandial:   less than 180 mg/dL (1-2 hours)      Critically ill patients:  140 - 180 mg/dL   Lab Results  Component Value Date   GLUCAP 203 (H) 12/30/2020   HGBA1C 6.7 (H) 06/15/2020    Review of Glycemic Control Results for MARCELUS, DUBBERLY (MRN [id number]) as of 12/30/2020 10:37  Ref. Range 12/29/2020 07:59 12/29/2020 11:28 12/29/2020 16:08 12/29/2020 20:58 12/30/2020 08:25  Glucose-Capillary Latest Ref Range: 70 - 99 mg/dL 182 (H)  Novolog 2 units 165 (H)  Novolog 2 units 181 (H)  Novolog 2 units 260 (H)  Novolog 3 units 203 (H)  Novolog 3 units   Diabetes history: DM 2 Outpatient Diabetes medications: None Current orders for Inpatient glycemic control:  Novolog 0-9 units tid + hs  Solumedrol 40 mg Q12 hours  Inpatient Diabetes Program Recommendations:    - Add Novolog 2 units tid if eating >50% of meals  Thanks,  Tama Headings RN, MSN, BC-ADM Inpatient Diabetes Coordinator Team Pager [phone] (8a-5p)

## 2020-12-30 NOTE — Evaluation (Signed)
Physical Therapy Evaluation Patient Details Name: [name] MRN: [id number] DOB: 04/25/1935 Today's Date: 12/30/2020   History of Present Illness  86 y.o. male presents to [facility] ED on 12/27/2020 with progressive weakness, cough, nausea, vomiting, and fever, all since COVID-19 diagnosis 10 days prior. CXR revealed multifocal PNA, blood cultures positive for strep PNA. 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  Clinical Impression  Pt tolerates ambulation of household distances with use of RW, without physical assistance. Pt reports using SPC or walking stick PTA for mobility on uneven surfaces and in community. Pt may benefit from gait training without use of an AD at next session as pt reportedly does not use device in the home. Pt demonstrates generalized weakness and deficits in activity tolerance and will benefit from acute PT to improve independence in mobility. SPT recommends HHPT to aid in return to prior level.     Follow Up Recommendations Home health PT    Equipment Recommendations  None recommended by PT    Recommendations for Other Services       Precautions / Restrictions Precautions Precautions: Fall Restrictions Weight Bearing Restrictions: No      Mobility  Bed Mobility Overal bed mobility: Needs Assistance Bed Mobility: Supine to Sit     Supine to sit: Min guard;HOB elevated     General bed mobility comments: min G for safety, HOB elevated    Transfers Overall transfer level: Needs assistance Equipment used: Rolling walker (2 wheeled) Transfers: Sit to/from Stand (x 2) Sit to Stand: Min guard         General transfer comment: min G for safety and cues for hand placement  Ambulation/Gait Ambulation/Gait assistance: Min guard;Supervision Gait Distance (Feet): 110 Feet Assistive device: Rolling walker (2 wheeled) Gait Pattern/deviations: Step-through pattern;Decreased stride length;Trunk flexed Gait velocity:  reduced Gait velocity interpretation: 1.31 - 2.62 ft/sec, indicative of limited community ambulator General Gait Details: slow mostly steady step-through gait.  Stairs            Wheelchair Mobility    Modified Rankin (Stroke Patients Only)       Balance Overall balance assessment: Needs assistance Sitting-balance support: Feet supported Sitting balance-Leahy Scale: Fair     Standing balance support: During functional activity Standing balance-Leahy Scale: Poor Standing balance comment: Pt reliant on at least single UE support                             Pertinent Vitals/Pain Pain Assessment: No/denies pain    Home Living Family/patient expects to be discharged to:: Private residence Living Arrangements: Spouse/significant other Available Help at Discharge: Family;Available 24 hours/day Type of Home: House Home Access: Stairs to enter Entrance Stairs-Rails: Can reach both Entrance Stairs-Number of Steps:  (a couple) Home Layout: One level Home Equipment: Environmental consultant - 2 wheels;Cane - single point      Prior Function Level of Independence: Independent with assistive device(s)         Comments: Pt reports using cane/walking stick when ambulating over uneven surfaces and in the community. Pt does not use device in the home     Hand Dominance        Extremity/Trunk Assessment   Upper Extremity Assessment Upper Extremity Assessment: Defer to OT evaluation    Lower Extremity Assessment Lower Extremity Assessment: Generalized weakness       Communication   Communication: HOH  Cognition Arousal/Alertness: Awake/alert Behavior During Therapy: [facility]  for tasks assessed/performed Overall Cognitive Status: Within Functional Limits for tasks assessed                                        General Comments General comments (skin integrity, edema, etc.): Pt on 2 L initally. Pt requires 3 LPM during mobility to maintain oxygen sat  levels at or above 92%. Pt left on 3 LPM to maintain oxygen sats above 92% at end of session.    Exercises     Assessment/Plan    PT Assessment Patient needs continued PT services  PT Problem List Decreased strength;Decreased activity tolerance;Decreased balance;Decreased mobility;Decreased knowledge of use of DME;Decreased knowledge of precautions       PT Treatment Interventions Gait training;DME instruction;Stair training;Functional mobility training;Therapeutic activities;Therapeutic exercise;Balance training;Patient/family education    PT Goals (Current goals can be found in the Care Plan section)  Acute Rehab PT Goals Patient Stated Goal: get better and go home PT Goal Formulation: With patient Time For Goal Achievement: 01/13/21 Potential to Achieve Goals: Good    Frequency Min 3X/week   Barriers to discharge        Co-evaluation               AM-PAC PT "6 Clicks" Mobility  Outcome Measure Help needed turning from your back to your side while in a flat bed without using bedrails?: None Help needed moving from lying on your back to sitting on the side of a flat bed without using bedrails?: A Little Help needed moving to and from a bed to a chair (including a wheelchair)?: A Little Help needed standing up from a chair using your arms (e.g., wheelchair or bedside chair)?: A Little Help needed to walk in hospital room?: A Little Help needed climbing 3-5 steps with a railing? : A Little 6 Click Score: 19    End of Session   Activity Tolerance: Patient tolerated treatment well Patient left: in chair;with call bell/phone within reach;with family/visitor present Nurse Communication: Mobility status;Other (comment) (o2) PT Visit Diagnosis: Other abnormalities of gait and mobility (R26.89);Muscle weakness (generalized) (M62.81)    Time: 3295-1884 PT Time Calculation (min) (ACUTE ONLY): 42 min   Charges:   PT Evaluation $PT Eval Low Complexity: 1 Low PT  Treatments $Therapeutic Activity: 8-22 mins        Acute Rehab  Pager: [phone]   Garwin Brothers, SPT  12/30/2020, 4:58 PM

## 2020-12-31 ENCOUNTER — Inpatient Hospital Stay (HOSPITAL_COMMUNITY): Payer: Medicare HMO

## 2020-12-31 DIAGNOSIS — I4891 Unspecified atrial fibrillation: Secondary | ICD-10-CM

## 2020-12-31 DIAGNOSIS — J9601 Acute respiratory failure with hypoxia: Secondary | ICD-10-CM

## 2020-12-31 DIAGNOSIS — R7989 Other specified abnormal findings of blood chemistry: Secondary | ICD-10-CM

## 2020-12-31 DIAGNOSIS — U071 COVID-19: Secondary | ICD-10-CM

## 2020-12-31 LAB — CBC WITH DIFFERENTIAL/PLATELET
Abs Immature Granulocytes: 0.35 10*3/uL — ABNORMAL HIGH (ref 0.00–0.07)
Basophils Absolute: 0 10*3/uL (ref 0.0–0.1)
Basophils Relative: 0 %
Eosinophils Absolute: 0 10*3/uL (ref 0.0–0.5)
Eosinophils Relative: 0 %
HCT: 34.4 % — ABNORMAL LOW (ref 39.0–52.0)
Hemoglobin: 11.7 g/dL — ABNORMAL LOW (ref 13.0–17.0)
Immature Granulocytes: 4 %
Lymphocytes Relative: 6 %
Lymphs Abs: 0.5 10*3/uL — ABNORMAL LOW (ref 0.7–4.0)
MCH: 29.2 pg (ref 26.0–34.0)
MCHC: 34 g/dL (ref 30.0–36.0)
MCV: 85.8 fL (ref 80.0–100.0)
Monocytes Absolute: 0.6 10*3/uL (ref 0.1–1.0)
Monocytes Relative: 8 %
Neutro Abs: 6.5 10*3/uL (ref 1.7–7.7)
Neutrophils Relative %: 82 %
Platelets: 259 10*3/uL (ref 150–400)
RBC: 4.01 MIL/uL — ABNORMAL LOW (ref 4.22–5.81)
RDW: 13.5 % (ref 11.5–15.5)
WBC: 7.6 10*3/uL (ref 4.0–10.5)
nRBC: 0 % (ref 0.0–0.2)

## 2020-12-31 LAB — COMPREHENSIVE METABOLIC PANEL
ALT: 64 U/L — ABNORMAL HIGH (ref 0–44)
AST: 49 U/L — ABNORMAL HIGH (ref 15–41)
Albumin: 2.3 g/dL — ABNORMAL LOW (ref 3.5–5.0)
Alkaline Phosphatase: 59 U/L (ref 38–126)
Anion gap: 7 (ref 5–15)
BUN: 23 mg/dL (ref 8–23)
CO2: 22 mmol/L (ref 22–32)
Calcium: 8.3 mg/dL — ABNORMAL LOW (ref 8.9–10.3)
Chloride: 107 mmol/L (ref 98–111)
Creatinine, Ser: 0.66 mg/dL (ref 0.61–1.24)
GFR, Estimated: >60 mL/min (ref >60)
Glucose, Bld: 267 mg/dL — ABNORMAL HIGH (ref 70–99)
Potassium: 3.6 mmol/L (ref 3.5–5.1)
Sodium: 136 mmol/L (ref 135–145)
Total Bilirubin: 0.7 mg/dL (ref 0.3–1.2)
Total Protein: 5.3 g/dL — ABNORMAL LOW (ref 6.5–8.1)

## 2020-12-31 LAB — ECHOCARDIOGRAM LIMITED
Height: 67 in
S' Lateral: 2.8 cm
Weight: 2800 oz

## 2020-12-31 LAB — GLUCOSE, CAPILLARY
Glucose-Capillary: 198 mg/dL — ABNORMAL HIGH (ref 70–99)
Glucose-Capillary: 299 mg/dL — ABNORMAL HIGH (ref 70–99)
Glucose-Capillary: 327 mg/dL — ABNORMAL HIGH (ref 70–99)
Glucose-Capillary: 262 mg/dL — ABNORMAL HIGH (ref 70–99)

## 2020-12-31 LAB — C-REACTIVE PROTEIN: CRP: 8.7 mg/dL — ABNORMAL HIGH (ref <1.0)

## 2020-12-31 LAB — D-DIMER, QUANTITATIVE: D-Dimer, Quant: 3.26 ug/mL-FEU — ABNORMAL HIGH (ref 0.00–0.50)

## 2020-12-31 LAB — MAGNESIUM: Magnesium: 1.8 mg/dL (ref 1.7–2.4)

## 2020-12-31 MED ORDER — APIXABAN 5 MG PO TABS
5.0000 mg | ORAL_TABLET | Freq: Two times a day (BID) | ORAL | Status: DC
  Administered 2020-12-31 – 2021-01-01 (×2): 5 mg via ORAL
  Filled 2020-12-31 (×2): qty 1

## 2020-12-31 MED ORDER — PREDNISONE 20 MG PO TABS
40.0000 mg | ORAL_TABLET | Freq: Every day | ORAL | Status: DC
  Administered 2021-01-01: 40 mg via ORAL
  Filled 2020-12-31: qty 2

## 2020-12-31 NOTE — Progress Notes (Addendum)
PROGRESS NOTE                                                                                                                                                                                                             Patient Demographics:    [name], is a 86 y.o. male, DOB - 1934-07-23, EML:[id number]  Outpatient Primary MD for the patient is Jinny Sanders, MD   Admit date - 12/27/2020   LOS - 3  Chief Complaint  Patient presents with   Fatigue   Covid Positive       Brief Narrative: Patient is a 86 y.o. male with PMHx of CAD s/p CABG in 2016, HTN, HLD, PAF-who was diagnosed with COVID-19 on 6/10 (home test)-presented to the ED on 6/16 with worsening cough/shortness of breath-he was found to have superimposed bacterial pneumonia (elevated procalcitonin level)-and was subsequently admitted to the hospitalist service  COVID-19 vaccinated status: Vaccinated including booster  Significant Events: 6/19>> Admit to [facility] for presumed superimposed bacterial pneumonia on COVID-19 infection  Significant studies: 6/19>>Chest x-ray: Patchy bilateral airspace opacities. 6/23>> TTE: EF 70-75%  COVID-19 medications: Steroids: 6/19>> Remdesivir: 6/19>>  Antibiotics: Rocephin: 6/19>> Zithromax: 6/19 x 1  Microbiology data: 6/19>> blood culture: Streptococcus pneumoniae 6/21 >>blood culture: No growth  Procedures: None  Consults: None  DVT prophylaxis: enoxaparin (LOVENOX) injection 40 mg Start: 12/28/20 1000   Subjective:   Feels better-has been titrated to room air today.   Assessment  & Plan :   Severe sepsis due to pneumococcal pneumonia with bacteremia: Sepsis physiology has resolved-remains on IV Rocephin-repeat blood cultures on 6/21 negative so far.  Continue Rocephin-for another day or so-we will switch to appropriate oral antimicrobial therapy on discharge.  Acute Hypoxic Resp Failure due to  pneumococcal pneumonia +/- COVID pneumonia: Suspect that this is predominantly pneumococcal pneumonia causing hypoxemic respiratory failure-however unable to rule out some component of COVID-19 pneumonia as well.  Hypoxia has improved-he is now on room air.  Continue steroids but taper-remains on Remdesivir/Rocephin.  Encourage incentive spirometry-and continue to mobilize as much as possible  O2 requirements:  SpO2: 97 % O2 Flow Rate (L/min): 2 L/min   COVID-19 Labs: Recent Labs    12/30/20 0120 12/31/20 0043  DDIMER 3.58* 3.26*  CRP 18.3* 8.7*    Elevated D-dimer: Downtrending-likely due to COVID-19-hypoxia improving-awaiting lower  extremity Doppler.    HTN: BP on the higher side-increase amlodipine to 10 mg-continue current dosing of lisinopril/Imdur.  Reassess over the next few days and adjust accordingly.   CAD s/p four-vessel CABG 2016: No anginal symptoms-continue Plavix, statin.  No longer on beta-blocker given prior history of second-degree AV block/junctional bradycardia (noted in 48-hour Holter 2019) per review of outpatient cardiology note.  PAF: Probably provoked by pneumonia-no history of CVA-Echo with stable EF-rate remains controlled-we will try to discuss with his primary cardiologist today-regarding appropriateness of anticoagulation (CHA2DS2-VASc of at least 4)-he has a history of SDH in 2015.   Addendum: Discussed with Dr. Shanon Payor primary cardiologist-chart reviewed-his SDH was in 2015-recommendations are to stop Plavix and start Eliquis.  HLD: On statin  History of junctional bradycardia/second-degree AV block: Noted in 48-hour Holter-in 2019-continue telemetry monitoring-avoid rate control agents.  DM-2 (A1c 6.7 on 06/15/2020) with uncontrolled hyperglycemia due to steroids: CBGs on the higher side-have transitioned from Solu-Medrol to prednisone-anticipate that CBGs will improve over the next day or so-continue SSI and follow.   Recent Labs     12/30/20 2048 12/31/20 0745 12/31/20 1217  GLUCAP 272* 198* 299*        ABG:    Component Value Date/Time   PHART 7.371 11/04/2014 0013   PCO2ART 35.4 11/04/2014 0013   PO2ART 100.0 11/04/2014 0013   HCO3 20.4 11/04/2014 0013   TCO2 22 04/19/2016 1856   ACIDBASEDEF 4.0 (H) 11/04/2014 0013   O2SAT 97.0 11/04/2014 0013    Vent Settings: N/A    Condition - Stable  Family Communication  :  Spouse at bedside  Code Status :  Full Code  Diet :  Diet Order             Diet heart healthy/carb modified Room service appropriate? No; Fluid consistency: Thin  Diet effective now                    Disposition Plan  :   Status is: Inpatient  Remains inpatient appropriate because:Inpatient level of care appropriate due to severity of illness  Dispo:  Patient From: Home  Planned Disposition: Home  Medically stable for discharge: No      Barriers to discharge: Hypoxia requiring O2 supplementation/complete 5 days of IV Remdesivir  Antimicorbials  :    Anti-infectives (From admission, onward)    Start     Dose/Rate Route Frequency Ordered Stop   12/29/20 1000  remdesivir 100 mg in sodium chloride 0.9 % 100 mL IVPB       See Hyperspace for full Linked Orders Report.   100 mg 200 mL/hr over 30 Minutes Intravenous Daily 12/28/20 0016 01/02/21 0959   12/28/20 2245  cefTRIAXone (ROCEPHIN) 2 g in sodium chloride 0.9 % 100 mL IVPB        2 g 200 mL/hr over 30 Minutes Intravenous Every 24 hours 12/28/20 2159     12/28/20 2200  azithromycin (ZITHROMAX) 500 mg in sodium chloride 0.9 % 250 mL IVPB  Status:  Discontinued        500 mg 250 mL/hr over 60 Minutes Intravenous Every 24 hours 12/28/20 0016 12/28/20 1749   12/28/20 2200  cefTRIAXone (ROCEPHIN) 1 g in sodium chloride 0.9 % 100 mL IVPB  Status:  Discontinued        1 g 200 mL/hr over 30 Minutes Intravenous Every 24 hours 12/28/20 0016 12/28/20 2159   12/28/20 0200  remdesivir 200 mg in sodium chloride 0.9% 250 mL  IVPB       See Hyperspace for full Linked Orders Report.   200 mg 580 mL/hr over 30 Minutes Intravenous Once 12/28/20 0016 12/28/20 0332   12/27/20 2200  cefTRIAXone (ROCEPHIN) 1 g in sodium chloride 0.9 % 100 mL IVPB        1 g 200 mL/hr over 30 Minutes Intravenous  Once 12/27/20 2156 12/27/20 2348   12/27/20 2200  azithromycin (ZITHROMAX) 500 mg in sodium chloride 0.9 % 250 mL IVPB        500 mg 250 mL/hr over 60 Minutes Intravenous  Once 12/27/20 2156 12/27/20 2348       Inpatient Medications  Scheduled Meds:  amLODipine  5 mg Oral Daily   vitamin C  500 mg Oral Daily   cholecalciferol  1,000 Units Oral Daily   clopidogrel  75 mg Oral Daily   enoxaparin (LOVENOX) injection  40 mg Subcutaneous Daily   feeding supplement  237 mL Oral TID BM   folic acid  1 mg Oral Daily   insulin aspart  0-5 Units Subcutaneous QHS   insulin aspart  0-9 Units Subcutaneous TID WC   Ipratropium-Albuterol  1 puff Inhalation Q6H   isosorbide mononitrate  30 mg Oral Daily   lisinopril  40 mg Oral Daily   mouth rinse  15 mL Mouth Rinse BID   methylPREDNISolone (SOLU-MEDROL) injection  40 mg Intravenous BID   multivitamin with minerals  1 tablet Oral Daily   rosuvastatin  5 mg Oral Daily   thiamine  100 mg Oral Daily   zinc sulfate  220 mg Oral Daily   Continuous Infusions:  sodium chloride Stopped (12/30/20 2352)   cefTRIAXone (ROCEPHIN)  IV Stopped (12/30/20 2233)   remdesivir 100 mg in NS 100 mL 100 mg (12/31/20 0905)   PRN Meds:.sodium chloride, acetaminophen, guaiFENesin-dextromethorphan, loperamide, prochlorperazine   Time Spent in minutes  35   See all Orders from today for further details   Oren Binet M.D on 12/31/2020 at 12:28 PM  To page go to [email or web address] - use universal password  [facility] -  Office  [phone]    Objective:   Vitals:   12/31/20 0421 12/31/20 0745 12/31/20 1214 12/31/20 1218  BP: (!) 172/91 (!) 184/93 (!) 141/78   Pulse: 88 91 84    Resp: (!) 24 20 20    Temp:  98 F (36.7 C) 97.9 F (36.6 C)   TempSrc:  Axillary Oral   SpO2: 96% 96% 97% 97%  Weight:      Height:        Wt Readings from Last 3 Encounters:  12/27/20 79.4 kg  09/04/20 83.9 kg  06/23/20 83.9 kg     Intake/Output Summary (Last 24 hours) at 12/31/2020 1228 Last data filed at 12/31/2020 1040 Gross per 24 hour  Intake 1040.56 ml  Output 1500 ml  Net -459.44 ml      Physical Exam Gen Exam:Alert awake-not in any distress HEENT:atraumatic, normocephalic Chest: B/L clear to auscultation anteriorly CVS:S1S2 regular Abdomen:soft non tender, non distended Extremities:no edema Neurology: Non focal Skin: no rash    Data Review:    CBC Recent Labs  Lab 12/27/20 2155 12/29/20 0024 12/30/20 0120 12/31/20 0043  WBC 4.7 5.4 6.2 7.6  HGB 13.8 11.8* 11.8* 11.7*  HCT 41.6 34.2* 34.7* 34.4*  PLT 175 207 204 259  MCV 88.3 85.3 86.3 85.8  MCH 29.3 29.4 29.4 29.2  MCHC 33.2 34.5 34.0 34.0  RDW 13.5 13.6 13.7 13.5  LYMPHSABS 0.4* 0.4* 0.4* 0.5*  MONOABS 0.1 0.2 0.3 0.6  EOSABS 0.0 0.0 0.0 0.0  BASOSABS 0.0 0.0 0.0 0.0     Chemistries  Recent Labs  Lab 12/27/20 2155 12/29/20 0024 12/30/20 0120 12/31/20 0043  NA 134* 136 134* 136  K 3.4* 3.6 3.9 3.6  CL 103 104 101 107  CO2 20* 24 23 22   GLUCOSE 151* 196* 185* 267*  BUN 26* 19 21 23   CREATININE 1.00 0.83 0.65 0.66  CALCIUM 8.7* 8.2* 8.5* 8.3*  MG  --   --  1.9 1.8  AST 25 25 46* 49*  ALT 19 18 38 64*  ALKPHOS 37* 34* 41 59  BILITOT 1.3* 0.9 0.5 0.7    ------------------------------------------------------------------------------------------------------------------ No results for input(s): CHOL, HDL, LDLCALC, TRIG, CHOLHDL, LDLDIRECT in the last 72 hours.   Lab Results  Component Value Date   HGBA1C 6.7 (H) 06/15/2020   ------------------------------------------------------------------------------------------------------------------ No results for input(s): TSH,  T4TOTAL, T3FREE, THYROIDAB in the last 72 hours.  Invalid input(s): FREET3 ------------------------------------------------------------------------------------------------------------------ No results for input(s): VITAMINB12, FOLATE, FERRITIN, TIBC, IRON, RETICCTPCT in the last 72 hours.   Coagulation profile No results for input(s): INR, PROTIME in the last 168 hours.  Recent Labs    12/30/20 0120 12/31/20 0043  DDIMER 3.58* 3.26*     Cardiac Enzymes No results for input(s): CKMB, TROPONINI, MYOGLOBIN in the last 168 hours.  Invalid input(s): CK ------------------------------------------------------------------------------------------------------------------ No results found for: BNP  Micro Results Recent Results (from the past 240 hour(s))  Blood Culture (routine x 2)     Status: Abnormal   Collection Time: 12/27/20 10:30 PM   Specimen: BLOOD  Result Value Ref Range Status   Specimen Description BLOOD RIGHT ANTECUBITAL  Final   Special Requests   Final    BOTTLES DRAWN AEROBIC AND ANAEROBIC Blood Culture adequate volume   Culture  Setup Time   Final    GRAM POSITIVE COCCI IN BOTH AEROBIC AND ANAEROBIC BOTTLES CRITICAL VALUE NOTED.  VALUE IS CONSISTENT WITH PREVIOUSLY REPORTED AND CALLED VALUE.    Culture (A)  Final    STREPTOCOCCUS PNEUMONIAE SUSCEPTIBILITIES PERFORMED ON PREVIOUS CULTURE WITHIN THE LAST 5 DAYS. Performed at [facility], [place] [address], [place], [place] [address]    Report Status 12/30/2020 FINAL  Final  Blood Culture (routine x 2)     Status: Abnormal   Collection Time: 12/27/20 10:40 PM   Specimen: BLOOD RIGHT HAND  Result Value Ref Range Status   Specimen Description BLOOD RIGHT HAND  Final   Special Requests   Final    BOTTLES DRAWN AEROBIC AND ANAEROBIC Blood Culture results may not be optimal due to an inadequate volume of blood received in culture bottles   Culture  Setup Time   Final    GRAM POSITIVE COCCI IN BOTH AEROBIC AND  ANAEROBIC BOTTLES CRITICAL RESULT CALLED TO, READ BACK BY AND VERIFIED WITH: Amada Kingfisher CHEN [phone] [id number] FCP Performed at [facility], [place] [address] [place], [place] [address]    Culture STREPTOCOCCUS PNEUMONIAE (A)  Final   Report Status 12/30/2020 FINAL  Final   Organism ID, Bacteria STREPTOCOCCUS PNEUMONIAE  Final      Susceptibility   Streptococcus pneumoniae - MIC*    ERYTHROMYCIN <=0.12 SENSITIVE Sensitive     LEVOFLOXACIN 1 SENSITIVE Sensitive     VANCOMYCIN 0.5 SENSITIVE Sensitive     PENICILLIN (meningitis) <=0.06 SENSITIVE Sensitive     PENO - penicillin <=0.06  PENICILLIN (non-meningitis) <=0.06 SENSITIVE Sensitive     PENICILLIN (oral) <=0.06 SENSITIVE Sensitive     CEFTRIAXONE (non-meningitis) <=0.12 SENSITIVE Sensitive     CEFTRIAXONE (meningitis) <=0.12 SENSITIVE Sensitive     * STREPTOCOCCUS PNEUMONIAE  Blood Culture ID Panel (Reflexed)     Status: Abnormal   Collection Time: 12/27/20 10:40 PM  Result Value Ref Range Status   Enterococcus faecalis NOT DETECTED NOT DETECTED Final   Enterococcus Faecium NOT DETECTED NOT DETECTED Final   Listeria monocytogenes NOT DETECTED NOT DETECTED Final   Staphylococcus species NOT DETECTED NOT DETECTED Final   Staphylococcus aureus (BCID) NOT DETECTED NOT DETECTED Final   Staphylococcus epidermidis NOT DETECTED NOT DETECTED Final   Staphylococcus lugdunensis NOT DETECTED NOT DETECTED Final   Streptococcus species DETECTED (A) NOT DETECTED Final    Comment: CRITICAL RESULT CALLED TO, READ BACK BY AND VERIFIED WITH: PHARMD L CHEN [phone] FCP    Streptococcus agalactiae NOT DETECTED NOT DETECTED Final   Streptococcus pneumoniae DETECTED (A) NOT DETECTED Final    Comment: CRITICAL RESULT CALLED TO, READ BACK BY AND VERIFIED WITH: PHARMD L CHEN [phone] FCP    Streptococcus pyogenes NOT DETECTED NOT DETECTED Final   A.calcoaceticus-baumannii NOT DETECTED NOT DETECTED Final   Bacteroides fragilis NOT DETECTED NOT  DETECTED Final   Enterobacterales NOT DETECTED NOT DETECTED Final   Enterobacter cloacae complex NOT DETECTED NOT DETECTED Final   Escherichia coli NOT DETECTED NOT DETECTED Final   Klebsiella aerogenes NOT DETECTED NOT DETECTED Final   Klebsiella oxytoca NOT DETECTED NOT DETECTED Final   Klebsiella pneumoniae NOT DETECTED NOT DETECTED Final   Proteus species NOT DETECTED NOT DETECTED Final   Salmonella species NOT DETECTED NOT DETECTED Final   Serratia marcescens NOT DETECTED NOT DETECTED Final   Haemophilus influenzae NOT DETECTED NOT DETECTED Final   Neisseria meningitidis NOT DETECTED NOT DETECTED Final   Pseudomonas aeruginosa NOT DETECTED NOT DETECTED Final   Stenotrophomonas maltophilia NOT DETECTED NOT DETECTED Final   Candida albicans NOT DETECTED NOT DETECTED Final   Candida auris NOT DETECTED NOT DETECTED Final   Candida glabrata NOT DETECTED NOT DETECTED Final   Candida krusei NOT DETECTED NOT DETECTED Final   Candida parapsilosis NOT DETECTED NOT DETECTED Final   Candida tropicalis NOT DETECTED NOT DETECTED Final   Cryptococcus neoformans/gattii NOT DETECTED NOT DETECTED Final    Comment: Performed at [facility], [address] [place], [place] [address]  Resp Panel by RT-PCR (Flu A&B, Covid) Nasopharyngeal Swab     Status: Abnormal   Collection Time: 12/27/20 10:58 PM   Specimen: Nasopharyngeal Swab; Nasopharyngeal(NP) swabs in vial transport medium  Result Value Ref Range Status   SARS Coronavirus 2 by RT PCR POSITIVE (A) NEGATIVE Final    Comment: RESULT CALLED TO, READ BACK BY AND VERIFIED WITH: RN TATE GRACE BY MESSAN H. AT 2358 ON 6 19 2022 (NOTE) SARS-CoV-2 target nucleic acids are DETECTED.  The SARS-CoV-2 RNA is generally detectable in upper respiratory specimens during the acute phase of infection. Positive results are indicative of the presence of the identified virus, but do not rule out bacterial infection or co-infection with other pathogens  not detected by the test. Clinical correlation with patient history and other diagnostic information is necessary to determine patient infection status. The expected result is Negative.  Fact Sheet for Patients: EntrepreneurPulse.com.au  Fact Sheet for Healthcare Providers: IncredibleEmployment.be  This test is not yet approved or cleared by the [place] and  has been authorized for detection and/or diagnosis of SARS-CoV-2 by FDA under an Emergency Use Authorization (EUA).  This EUA will remain in effect (meaning th is test can be used) for the duration of  the COVID-19 declaration under Section 564(b)(1) of the Act, 21 U.S.C. section 360bbb-3(b)(1), unless the authorization is terminated or revoked sooner.     Influenza A by PCR NEGATIVE NEGATIVE Final   Influenza B by PCR NEGATIVE NEGATIVE Final    Comment: (NOTE) The Xpert Xpress SARS-CoV-2/FLU/RSV plus assay is intended as an aid in the diagnosis of influenza from Nasopharyngeal swab specimens and should not be used as a sole basis for treatment. Nasal washings and aspirates are unacceptable for Xpert Xpress SARS-CoV-2/FLU/RSV testing.  Fact Sheet for Patients: EntrepreneurPulse.com.au  Fact Sheet for Healthcare Providers: IncredibleEmployment.be  This test is not yet approved or cleared by the [place] FDA and has been authorized for detection and/or diagnosis of SARS-CoV-2 by FDA under an Emergency Use Authorization (EUA). This EUA will remain in effect (meaning this test can be used) for the duration of the COVID-19 declaration under Section 564(b)(1) of the Act, 21 U.S.C. section 360bbb-3(b)(1), unless the authorization is terminated or revoked.  Performed at [facility], [place] [address], [place], [place] [address]   MRSA Next Gen by PCR, Nasal     Status: None   Collection Time: 12/28/20  6:49 PM   Specimen: Nasal Mucosa;  Nasal Swab  Result Value Ref Range Status   MRSA by PCR Next Gen NOT DETECTED NOT DETECTED Final    Comment: (NOTE) The GeneXpert MRSA Assay (FDA approved for NASAL specimens only), is one component of a comprehensive MRSA colonization surveillance program. It is not intended to diagnose MRSA infection nor to guide or monitor treatment for MRSA infections. Test performance is not FDA approved in patients less than 72 years old. Performed at [facility], [place] [address], [place], [place] [address]   Culture, blood (routine x 2)     Status: None (Preliminary result)   Collection Time: 12/29/20 11:28 AM   Specimen: BLOOD  Result Value Ref Range Status   Specimen Description BLOOD LEFT ANTECUBITAL  Final   Special Requests   Final    BOTTLES DRAWN AEROBIC AND ANAEROBIC Blood Culture results may not be optimal due to an inadequate volume of blood received in culture bottles   Culture   Final    NO GROWTH 2 DAYS Performed at [facility], [place] [address], [place], [place] [address]    Report Status PENDING  Incomplete  Culture, blood (routine x 2)     Status: None (Preliminary result)   Collection Time: 12/29/20 11:28 AM   Specimen: BLOOD  Result Value Ref Range Status   Specimen Description BLOOD LEFT ANTECUBITAL  Final   Special Requests   Final    BOTTLES DRAWN AEROBIC ONLY Blood Culture adequate volume   Culture   Final    NO GROWTH 2 DAYS Performed at [facility], [place] [address], [place], [place] [address]    Report Status PENDING  Incomplete    Radiology Reports DG Chest Port 1 View  Result Date: 12/27/2020 CLINICAL DATA:  Hypoxia and cough, COVID-19 positivity EXAM: PORTABLE CHEST 1 VIEW COMPARISON:  04/21/2016 FINDINGS: Cardiac shadow is prominent but stable. Large hiatal hernia is again identified. Postsurgical changes are seen. Patchy airspace opacities are noted in the bases bilaterally likely related to the patient's given clinical history. No  bony  abnormality is noted. IMPRESSION: Patchy bibasilar airspace opacities consistent with the given clinical history. Electronically Signed   By: Inez Catalina M.D.   On: 12/27/2020 22:45   ECHOCARDIOGRAM LIMITED  Result Date: 12/31/2020    ECHOCARDIOGRAM LIMITED REPORT   Patient Name:   [name] Date of Exam: 12/31/2020 Medical Rec #:  [id number]     Height:       67.0 in Accession #:    [id number]    Weight:       175.0 lb Date of Birth:  Sep 15, 1934     BSA:          1.911 m Patient Age:    36 years      BP:           184/93 mmHg Patient Gender: M             HR:           79 bpm. Exam Location:  Inpatient Procedure: Limited Echo, Limited Color Doppler and Cardiac Doppler Indications:    atrial fibrillation  History:        Patient has prior history of Echocardiogram examinations, most                 recent 08/16/2019. Prior CABG, covid pneumonia,                 Signs/Symptoms:Bacteremia; Risk Factors:Dyslipidemia,                 Hypertension and Diabetes.  Sonographer:    Johny Chess Referring Phys: [place]  1. Left ventricular ejection fraction, by estimation, is 70 to 75%. The left ventricle has hyperdynamic function. The left ventricle has no regional wall motion abnormalities. There is moderate left ventricular hypertrophy. Left ventricular diastolic function could not be evaluated.  2. Right ventricular systolic function is normal. The right ventricular size is normal. There is normal pulmonary artery systolic pressure.  3. The mitral valve is normal in structure. Trivial mitral valve regurgitation. No evidence of mitral stenosis.  4. The aortic valve is tricuspid. Aortic valve regurgitation is trivial. Mild aortic valve sclerosis is present, with no evidence of aortic valve stenosis.  5. The inferior vena cava is normal in size with greater than 50% respiratory variability, suggesting right atrial pressure of 3 mmHg. FINDINGS  Left Ventricle: Left ventricular ejection  fraction, by estimation, is 70 to 75%. The left ventricle has hyperdynamic function. The left ventricle has no regional wall motion abnormalities. The left ventricular internal cavity size was normal in size. There is moderate left ventricular hypertrophy. Left ventricular diastolic function could not be evaluated. Left ventricular diastolic function could not be evaluated due to atrial fibrillation. Right Ventricle: The right ventricular size is normal. Right ventricular systolic function is normal. There is normal pulmonary artery systolic pressure. The tricuspid regurgitant velocity is 2.82 m/s, and with an assumed right atrial pressure of 3 mmHg,  the estimated right ventricular systolic pressure is 30.0 mmHg. Left Atrium: Left atrial size was normal in size. Right Atrium: Right atrial size was normal in size. Pericardium: There is no evidence of pericardial effusion. Mitral Valve: The mitral valve is normal in structure. Mild mitral annular calcification. Trivial mitral valve regurgitation. No evidence of mitral valve stenosis. Tricuspid Valve: The tricuspid valve is normal in structure. Tricuspid valve regurgitation is mild . No evidence of tricuspid stenosis. Aortic Valve: The aortic valve is tricuspid. Aortic valve regurgitation is trivial.  Mild aortic valve sclerosis is present, with no evidence of aortic valve stenosis. Pulmonic Valve: The pulmonic valve was normal in structure. Pulmonic valve regurgitation is trivial. No evidence of pulmonic stenosis. Aorta: The aortic root is normal in size and structure. Venous: The inferior vena cava is normal in size with greater than 50% respiratory variability, suggesting right atrial pressure of 3 mmHg. IAS/Shunts: No atrial level shunt detected by color flow Doppler. LEFT VENTRICLE PLAX 2D LVIDd:         4.10 cm LVIDs:         2.80 cm LV PW:         1.40 cm LV IVS:        1.20 cm LVOT diam:     2.00 cm LVOT Area:     3.14 cm  IVC IVC diam: 2.30 cm LEFT ATRIUM          Index LA diam:    4.20 cm 2.20 cm/m   AORTA Ao Asc diam: 3.40 cm TRICUSPID VALVE TR Peak grad:   31.8 mmHg TR Vmax:        282.00 cm/s  SHUNTS Systemic Diam: 2.00 cm Kirk Ruths MD Electronically signed by Kirk Ruths MD Signature Date/Time: 12/31/2020/12:00:24 PM    Final

## 2020-12-31 NOTE — Progress Notes (Signed)
VASCULAR LAB    Bilateral lower extremity venous duplex has been performed.  See CV proc for preliminary results.   Sonakshi Rolland, RVT 12/31/2020, 3:49 PM

## 2020-12-31 NOTE — TOC Initial Note (Signed)
Transition of Care [facility]) - Initial/Assessment Note    Patient Details  Name: RUFINO STAUP MRN: [id number] Date of Birth: 19-Dec-1934  [facility]) CM/SW Contact:    Ninfa Meeker, RN Phone Number: 12/31/2020, 10:17 AM  Clinical Narrative:  Case manager spoke with patient's wife concerning discharge needs. She states they have both used [place] in the past, CM explained name change. Mrs. Lina is agreeable to referral being called to [facility] liaison. They live in a one level home, patient was not on oxygen prior to admission. We will continue to monitor for possible oxygen needs at discharge.             Expected Discharge Plan: [place] Barriers to Discharge: Continued Medical Work up   Patient Goals and CMS Choice Patient states their goals for this hospitalization and ongoing recovery are:: wife: to get better   Choice offered to / list presented to : Spouse  Expected Discharge Plan and Services Expected Discharge Plan: [place]   Discharge Planning Services: CM Consult Post Acute Care Choice: Home Health Living arrangements for the past 2 months: Single Family Home                 DME Arranged: N/A         HH Arranged: PT HH Agency: [place] Date [place]: 12/31/20 Time HH Agency Contacted: [id number] Representative spoke with at [place]: [place] Arrangements/Services Living arrangements for the past 2 months: [place] with:: Spouse Patient language and need for interpreter reviewed:: Yes Do you feel safe going back to the place where you live?: Yes      Need for Family Participation in Patient Care: Yes (Comment) Care giver support system in place?: Yes (comment)   Criminal Activity/Legal Involvement Pertinent to Current Situation/Hospitalization: No - Comment as needed  Activities of Daily Living Home Assistive Devices/Equipment: Cane (specify quad or  straight), Grab bars in shower, Grab bars around toilet, Shower chair with back ADL Screening (condition at time of admission) Patient's cognitive ability adequate to safely complete daily activities?: Yes Is the patient deaf or have difficulty hearing?: Yes Does the patient have difficulty seeing, even when wearing glasses/contacts?: No Does the patient have difficulty concentrating, remembering, or making decisions?: No Patient able to express need for assistance with ADLs?: Yes Does the patient have difficulty dressing or bathing?: No Independently performs ADLs?: Yes (appropriate for developmental age) Does the patient have difficulty walking or climbing stairs?: Yes Weakness of Legs: Both Weakness of Arms/Hands: Both  Permission Sought/Granted Permission sought to share information with : Case Manager       Permission granted to share info w AGENCY: [place]        Emotional Assessment       Orientation: : Oriented to Self, Oriented to Place, Oriented to  Time, Oriented to Situation Alcohol / Substance Use: Not Applicable Psych Involvement: No (comment)  Admission diagnosis:  Acute respiratory failure with hypoxia ([place]) [J96.01] Pneumonia due to COVID-19 virus [U07.1, J12.82] Patient Active Problem List   Diagnosis Date Noted   CAP (community acquired pneumonia) 12/29/2020   Streptococcal bacteremia 12/29/2020   Pneumonia due to COVID-19 virus 12/28/2020   Fatigue 06/23/2020   Left buttock pain 06/23/2020   Right carpal tunnel syndrome 03/16/2018   Hyperlipidemia associated with type 2 diabetes mellitus ([place]) 02/07/2018   DM type 2 with diabetic peripheral neuropathy ([place]) 03/10/2017  Neuropathy due to type 2 diabetes mellitus ([place]) 03/10/2017   Hypertension associated with diabetes ([place]) 04/20/2016   S/P total knee replacement 04/11/2016   Coronary artery disease involving native coronary artery of native heart without angina pectoris    S/P CABG x 4 11/03/2014    PERCUTANEOUS TRANSLUMINAL CORONARY ANGIOPLASTY, HX OF 11/26/2007   PCP:  Jinny Sanders, MD Pharmacy:   [facility] [address] [place] [address] [place] [address] Phone: [phone] Fax: [phone]     Social Determinants of Health (SDOH) Interventions    Readmission Risk Interventions No flowsheet data found.

## 2020-12-31 NOTE — Progress Notes (Signed)
ANTICOAGULATION CONSULT NOTE - Initial Consult  Pharmacy Consult for apixaban Indication: atrial fibrillation  Allergies  Allergen Reactions   Protamine Rash    Hypotension, Rash, unstable vitals.     Statins Other (See Comments)    Caused soreness (Lipitor, Zocor)   Adhesive [Tape] Hives   Rosuvastatin Calcium Other (See Comments)    Caused soreness (Lipitor, Zocor) 12/28/2020 Pt can tolerate low dose   Imodium [Loperamide] Itching and Rash    Patient Measurements: Height: 5\' 7"  (170.2 cm) Weight: 79.4 kg (175 lb) IBW/kg (Calculated) : 66.1 Heparin Dosing Weight:   Vital Signs: Temp: 97.9 F (36.6 C) (06/23 1214) Temp Source: Oral (06/23 1214) BP: 141/78 (06/23 1214) Pulse Rate: 84 (06/23 1214)  Labs: Recent Labs    12/29/20 0024 12/30/20 0120 12/31/20 0043  HGB 11.8* 11.8* 11.7*  HCT 34.2* 34.7* 34.4*  PLT 207 204 259  CREATININE 0.83 0.65 0.66    Estimated Creatinine Clearance: 69.4 mL/min (by C-G formula based on SCr of 0.66 mg/dL).   Medical History: Past Medical History:  Diagnosis Date   CAD (coronary artery disease)    s/p stent mid LAD 2005 // s/p CABG 2016 // Myoview 8/19:  EF 64, no ischemia   Coronary artery disease involving native coronary artery of native heart without angina pectoris    Depression    DJD (degenerative joint disease)    DM type 2 with diabetic peripheral neuropathy ([place]) 03/10/2017   E coli bacteremia 04/20/2016   Essential hypertension 04/20/2016   GERD (gastroesophageal reflux disease)    History of echocardiogram    Echo 8/19: Moderate LVH, EF 60-65, normal wall motion, grade 1 diastolic dysfunction, mild MR, mild LAE, normal RVSF, mild TR, PASP 25   History of hiatal hernia    HTN (hypertension)    Hyperlipidemia    Neuropathy due to type 2 diabetes mellitus ([place]) 03/10/2017   PERCUTANEOUS TRANSLUMINAL CORONARY ANGIOPLASTY, HX OF 11/26/2007   Annotation: With CYPHER STENT. Qualifier: Diagnosis of  By: Danny Lawless CMA,  [place]     Pre-diabetes    S/P CABG x 4 11/03/2014   S/P total knee replacement 04/11/2016   Sepsis due to Escherichia coli (E. coli) ([place]) 04/20/2016   Tinea pedis 09/08/2017    Medications:  Medications Prior to Admission  Medication Sig Dispense Refill Last Dose   acetaminophen (TYLENOL) 500 MG tablet Take 500-1,000 mg by mouth 2 (two) times daily as needed (for pain).    Past Week   amLODipine (NORVASC) 5 MG tablet Take 1 tablet by mouth once daily (Patient taking differently: Take 5 mg by mouth daily.) 90 tablet 3 Past Week   cholecalciferol (VITAMIN D3) 25 MCG (1000 UNIT) tablet Take 1,000 Units by mouth daily.   Past Week   clopidogrel (PLAVIX) 75 MG tablet Take 1 tablet by mouth once daily (Patient taking differently: Take 75 mg by mouth daily.) 90 tablet 3 Past Week   isosorbide mononitrate (IMDUR) 30 MG 24 hr tablet Take 1 tablet by mouth once daily (Patient taking differently: Take 30 mg by mouth daily.) 90 tablet 2 Past Week   lisinopril (ZESTRIL) 40 MG tablet Take 1 tablet by mouth once daily (Patient taking differently: Take 40 mg by mouth daily.) 90 tablet 1 Past Week   Multiple Vitamins-Minerals (ONE-A-DAY MENS HEALTH FORMULA PO) Take 1 tablet by mouth daily.   Past Week   nitroGLYCERIN (NITROSTAT) 0.4 MG SL tablet Place 1 tablet (0.4 mg total) under the tongue every 5 (five) minutes  as needed for chest pain. 10 tablet 0 unk   pantoprazole (PROTONIX) 40 MG tablet Take 1 tablet by mouth once daily (Patient taking differently: Take 40 mg by mouth daily.) 90 tablet 3 Past Week   rosuvastatin (CRESTOR) 5 MG tablet Take 1 tablet (5 mg total) by mouth daily. 90 tablet 3 Past Week   vitamin B-12 (CYANOCOBALAMIN) 1000 MCG tablet Take 1,000 mcg by mouth daily.   Past Week   Scheduled:   amLODipine  5 mg Oral Daily   apixaban  5 mg Oral BID   vitamin C  500 mg Oral Daily   cholecalciferol  1,000 Units Oral Daily   feeding supplement  237 mL Oral TID BM   folic acid  1 mg Oral Daily    insulin aspart  0-5 Units Subcutaneous QHS   insulin aspart  0-9 Units Subcutaneous TID WC   Ipratropium-Albuterol  1 puff Inhalation Q6H   isosorbide mononitrate  30 mg Oral Daily   lisinopril  40 mg Oral Daily   mouth rinse  15 mL Mouth Rinse BID   multivitamin with minerals  1 tablet Oral Daily   [START ON 01/01/2021] predniSONE  40 mg Oral Q breakfast   rosuvastatin  5 mg Oral Daily   thiamine  100 mg Oral Daily   zinc sulfate  220 mg Oral Daily   Infusions:   sodium chloride Stopped (12/30/20 2352)   cefTRIAXone (ROCEPHIN)  IV Stopped (12/30/20 2233)   remdesivir 100 mg in NS 100 mL 100 mg (12/31/20 0905)    Assessment: Pt was admitted for COVID. He was dx with new onset AF. Hx of SDH in 2015. Plan to start apixaban for anticoagulation.   Age>80 Wt >60kg Scr <1.5  Goal of Therapy:   Monitor platelets by anticoagulation protocol: Yes   Plan:  Dc lovenox Apixaban 5mg  PO BID Rx will follow peripherally  Onnie Boer, PharmD, BCIDP, AAHIVP, CPP Infectious Disease Pharmacist 12/31/2020 2:57 PM

## 2020-12-31 NOTE — Evaluation (Signed)
Occupational Therapy Evaluation Patient Details Name: [name] MRN: [id number] DOB: 1935/06/18 Today's Date: 12/31/2020    History of Present Illness 86 y.o. male [facility] ED [facility] 12/27/2020 with progressive weakness, cough, nausea, vomiting, and fever, all since COVID-19 diagnosis 10 days prior. CXR revealed multifocal PNA, blood cultures positive for strep PNA. 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   Clinical Impression   Patient admitted for the diagnosis above.  PTA he lives with his spouse, who is able to provide assist as needed at home.  He has progressed to room air, and is saturating 94% and above during evaluation and in room mobility.  Shortness of breath noted, but his biggest deficit is balance.  Currently he is needing balance support for all ADL and toileting completion without a RW.  OT will continue to follow in the acute setting, he plans on returning home with home health services.      Follow Up Recommendations  Home health OT    Equipment Recommendations  Tub/shower seat    Recommendations for Other Services       Precautions / Restrictions Precautions Precautions: Fall Precaution Comments: typically loses his balance backwards Restrictions Weight Bearing Restrictions: No      Mobility Bed Mobility               General bed mobility comments: up in recliner with spouse present Patient Response: Cooperative  Transfers Overall transfer level: Needs assistance Equipment used: 1 person hand held assist Transfers: Sit to/from Stand Sit to Stand: Min guard              Balance Overall balance assessment: Needs assistance Sitting-balance support: Feet supported Sitting balance-Leahy Scale: Good     Standing balance support: During functional activity;Single extremity supported Standing balance-Leahy Scale: Poor Standing balance comment: tends to lose balance backwards.                            ADL either performed or assessed with clinical judgement   ADL Overall ADL's : Needs assistance/impaired Eating/Feeding: Independent;Sitting   Grooming: Wash/dry hands;Min guard;Standing               Lower Body Dressing: Supervision/safety;Sitting/lateral leans   Toilet Transfer: Min Dentist           Functional mobility during ADLs: Min guard;Minimal assistance General ADL Comments: gait belt and no O2     Vision Patient Visual Report: No change from baseline                  Pertinent Vitals/Pain Pain Assessment: No/denies pain     Hand Dominance Right   Extremity/Trunk Assessment Upper Extremity Assessment Upper Extremity Assessment: Overall WFL for tasks assessed   Lower Extremity Assessment Lower Extremity Assessment: Defer to PT evaluation   Cervical / Trunk Assessment Cervical / Trunk Assessment: Kyphotic   Communication Communication Communication: HOH   Cognition Arousal/Alertness: Awake/alert Behavior During Therapy: WFL for tasks assessed/performed Overall Cognitive Status: Within Functional Limits for tasks assessed                                     General Comments  On RA.  97% in sitting, down only to 94% with standing tasks and in room mobility.  Home Living Family/patient expects to be discharged to:: Private residence Living Arrangements: Spouse/significant other Available Help at Discharge: Family;Available 24 hours/day Type of Home: House Home Access: Stairs to enter     Home Layout: One level     Bathroom Shower/Tub: Occupational psychologist: Handicapped height     Home Equipment: Environmental consultant - 2 wheels;Cane - single point          Prior Functioning/Environment Level of Independence: Independent with assistive device(s)        Comments: Per spouse, uses a cane when walking outside, does not use device in the home.  Completes his own self  care.        OT Problem List: Decreased activity tolerance;Impaired balance (sitting and/or standing)      OT Treatment/Interventions: Self-care/ADL training;Balance training;Therapeutic activities    OT Goals(Current goals can be found in the care plan section) Acute Rehab OT Goals Patient Stated Goal: Keep getting better and go back home OT Goal Formulation: With patient Time For Goal Achievement: 01/15/21 Potential to Achieve Goals: Good ADL Goals Pt Will Perform Grooming: with set-up;standing Pt Will Perform Lower Body Bathing: with set-up;sit to/from stand Pt Will Perform Lower Body Dressing: with set-up;sit to/from stand Pt Will Transfer to Toilet: with modified independence;ambulating;regular height toilet Pt Will Perform Toileting - Clothing Manipulation and hygiene: Independently;sitting/lateral leans  OT Frequency: Min 1X/week   Barriers to D/C:    none noted       Co-evaluation              AM-PAC OT "6 Clicks" Daily Activity     Outcome Measure Help from another person eating meals?: None Help from another person taking care of personal grooming?: None Help from another person toileting, which includes using toliet, bedpan, or urinal?: A Little Help from another person bathing (including washing, rinsing, drying)?: A Little Help from another person to put on and taking off regular upper body clothing?: None Help from another person to put on and taking off regular lower body clothing?: A Little 6 Click Score: 21   End of Session Equipment Utilized During Treatment: Gait belt  Activity Tolerance: Patient tolerated treatment well Patient left: in chair;with call bell/phone within reach;with family/visitor present  OT Visit Diagnosis: Unsteadiness on feet (R26.81)                Time: 8546-2703 OT Time Calculation (min): 22 min Charges:  OT General Charges $OT Visit: 1 Visit OT Evaluation $OT Eval Moderate Complexity: 1 Mod  12/31/2020  [place],  OTR/L  Acute Rehabilitation Services  Office:  [phone]   Metta Clines 12/31/2020, 12:22 PM

## 2020-12-31 NOTE — Progress Notes (Signed)
  Echocardiogram 2D Echocardiogram has been performed.  [name] 12/31/2020, 9:57 AM

## 2021-01-01 ENCOUNTER — Other Ambulatory Visit (HOSPITAL_COMMUNITY): Payer: Self-pay

## 2021-01-01 DIAGNOSIS — E1142 Type 2 diabetes mellitus with diabetic polyneuropathy: Secondary | ICD-10-CM

## 2021-01-01 DIAGNOSIS — R7881 Bacteremia: Secondary | ICD-10-CM | POA: Diagnosis not present

## 2021-01-01 DIAGNOSIS — J189 Pneumonia, unspecified organism: Secondary | ICD-10-CM | POA: Diagnosis not present

## 2021-01-01 DIAGNOSIS — J9601 Acute respiratory failure with hypoxia: Secondary | ICD-10-CM | POA: Diagnosis not present

## 2021-01-01 DIAGNOSIS — U071 COVID-19: Secondary | ICD-10-CM | POA: Diagnosis not present

## 2021-01-01 LAB — CBC WITH DIFFERENTIAL/PLATELET
Abs Immature Granulocytes: 0.3 10*3/uL — ABNORMAL HIGH (ref 0.00–0.07)
Basophils Absolute: 0 10*3/uL (ref 0.0–0.1)
Basophils Relative: 0 %
Eosinophils Absolute: 0 10*3/uL (ref 0.0–0.5)
Eosinophils Relative: 0 %
HCT: 37.1 % — ABNORMAL LOW (ref 39.0–52.0)
Hemoglobin: 12.7 g/dL — ABNORMAL LOW (ref 13.0–17.0)
Lymphocytes Relative: 17 %
Lymphs Abs: 1.9 10*3/uL (ref 0.7–4.0)
MCH: 29.3 pg (ref 26.0–34.0)
MCHC: 34.2 g/dL (ref 30.0–36.0)
MCV: 85.7 fL (ref 80.0–100.0)
Metamyelocytes Relative: 3 %
Monocytes Absolute: 1.3 10*3/uL — ABNORMAL HIGH (ref 0.1–1.0)
Monocytes Relative: 11 %
Neutro Abs: 7.9 10*3/uL — ABNORMAL HIGH (ref 1.7–7.7)
Neutrophils Relative %: 69 %
Platelets: 307 10*3/uL (ref 150–400)
RBC: 4.33 MIL/uL (ref 4.22–5.81)
RDW: 14 % (ref 11.5–15.5)
WBC: 11.4 10*3/uL — ABNORMAL HIGH (ref 4.0–10.5)
nRBC: 0.3 % — ABNORMAL HIGH (ref 0.0–0.2)
nRBC: 1 /100 WBC — ABNORMAL HIGH

## 2021-01-01 LAB — COMPREHENSIVE METABOLIC PANEL
ALT: 51 U/L — ABNORMAL HIGH (ref 0–44)
AST: 24 U/L (ref 15–41)
Albumin: 2.5 g/dL — ABNORMAL LOW (ref 3.5–5.0)
Alkaline Phosphatase: 46 U/L (ref 38–126)
Anion gap: 5 (ref 5–15)
BUN: 23 mg/dL (ref 8–23)
CO2: 26 mmol/L (ref 22–32)
Calcium: 8.2 mg/dL — ABNORMAL LOW (ref 8.9–10.3)
Chloride: 103 mmol/L (ref 98–111)
Creatinine, Ser: 0.76 mg/dL (ref 0.61–1.24)
GFR, Estimated: >60 mL/min (ref >60)
Glucose, Bld: 154 mg/dL — ABNORMAL HIGH (ref 70–99)
Potassium: 3.5 mmol/L (ref 3.5–5.1)
Sodium: 134 mmol/L — ABNORMAL LOW (ref 135–145)
Total Bilirubin: 0.5 mg/dL (ref 0.3–1.2)
Total Protein: 5.2 g/dL — ABNORMAL LOW (ref 6.5–8.1)

## 2021-01-01 LAB — MAGNESIUM: Magnesium: 1.9 mg/dL (ref 1.7–2.4)

## 2021-01-01 LAB — GLUCOSE, CAPILLARY
Glucose-Capillary: 167 mg/dL — ABNORMAL HIGH (ref 70–99)
Glucose-Capillary: 242 mg/dL — ABNORMAL HIGH (ref 70–99)

## 2021-01-01 LAB — C-REACTIVE PROTEIN: CRP: 4.5 mg/dL — ABNORMAL HIGH (ref <1.0)

## 2021-01-01 LAB — D-DIMER, QUANTITATIVE: D-Dimer, Quant: 3.49 ug/mL-FEU — ABNORMAL HIGH (ref 0.00–0.50)

## 2021-01-01 MED ORDER — AMLODIPINE BESYLATE 5 MG PO TABS
2.0000 | ORAL_TABLET | Freq: Every day | ORAL | 1 refills | Status: DC
Start: 2021-01-01 — End: 2021-02-09
  Filled 2021-01-01: qty 60, 30d supply, fill #0

## 2021-01-01 MED ORDER — APIXABAN 5 MG PO TABS
5.0000 mg | ORAL_TABLET | Freq: Two times a day (BID) | ORAL | 0 refills | Status: DC
  Filled 2021-01-01: qty 60, 30d supply, fill #0

## 2021-01-01 MED ORDER — BENZONATATE 100 MG PO CAPS
100.0000 mg | ORAL_CAPSULE | Freq: Three times a day (TID) | ORAL | 0 refills | Status: DC | PRN
  Filled 2021-01-01: qty 30, 10d supply, fill #0

## 2021-01-01 MED ORDER — PREDNISONE 10 MG PO TABS
ORAL_TABLET | ORAL | 0 refills | Status: DC
  Filled 2021-01-01: qty 10, 4d supply, fill #0

## 2021-01-01 MED ORDER — ALBUTEROL SULFATE HFA 108 (90 BASE) MCG/ACT IN AERS
2.0000 | INHALATION_SPRAY | Freq: Four times a day (QID) | RESPIRATORY_TRACT | 0 refills | Status: DC | PRN
  Filled 2021-01-01: qty 18, 30d supply, fill #0

## 2021-01-01 MED ORDER — AMOXICILLIN 500 MG PO CAPS
500.0000 mg | ORAL_CAPSULE | Freq: Three times a day (TID) | ORAL | 0 refills | Status: AC
  Filled 2021-01-01: qty 12, 4d supply, fill #0

## 2021-01-01 NOTE — Care Management Important Message (Signed)
Important Message  Patient Details  Name: [name] MRN: [id number] Date of Birth: 09-13-1934   Medicare Important Message Given:  Yes - Important Message mailed due to current National Emergency  Verbal consent obtained due to current National Emergency  Relationship to patient: Spouse/Significant Other Contact Name: Langley Gauss Call Date: 01/01/21  Time: 1329 Phone: [phone] Outcome: Spoke with contact Important Message mailed to: Patient address on file    Delorse Lek 01/01/2021, 1:29 PM

## 2021-01-01 NOTE — TOC Transition Note (Signed)
Transition of Care [facility]) - CM/SW Discharge Note   Patient Details  Name: [name] MRN: [id number] Date of Birth: 20-Jun-1935  Transition of Care [facility]) CM/SW Contact:  Sharin Mons, RN Phone Number: 01/01/2021, 10:54 AM   Clinical Narrative:    Patient will DC to: home  Anticipated DC date: 01/01/2021 Family notified: yes, wife Transport by: car   Per MD patient ready for DC today. RN, patient, patient's family, and facility notified of DC.    Referral made with [facility]  for home oxygen. Portable oxygen concentrator /tank will be delivered to bedside prior to d/c for transportation to home.  Pt without Rx med concerns or affordability issues. [facility] pharmacy to deliver Rx meds to bedside prior to d/c.  Post hospital f/u appointments noted on AVS.  Wife to provide transportation to home.  RNCM will sign off for now as intervention is no longer needed. Please consult [place] again if new needs arise.   Final next level of care: [place] Barriers to Discharge: No Barriers Identified   Patient Goals and CMS Choice Patient states their goals for this hospitalization and ongoing recovery are:: wife: to get better   Choice offered to / list presented to : Spouse  Discharge Placement                       Discharge Plan and Services   Discharge Planning Services: CM Consult Post Acute Care Choice: Home Health          DME Arranged: N/A DME Agency: [facility] Date DME Agency Contacted: 01/01/21 Time DME Agency Contacted: [id number] Representative spoke with at DME Agency: Freda Munro HH Arranged: PT [place]: [place] Date [place]: 12/31/20 Time [place]: [place] Representative spoke with at [place]: [place] Determinants of Health ([place]) Interventions     Readmission Risk Interventions No flowsheet data found.

## 2021-01-01 NOTE — Discharge Summary (Addendum)
PATIENT DETAILS Name: [name] Age: 86 y.o. Sex: male Date of Birth: 09/04/1934 MRN: [id number]. Admitting Physician: Kayleen Memos, DO [id number], Mervyn Gay, MD  Admit Date: 12/27/2020 Discharge date: 01/01/2021  Recommendations for Outpatient Follow-up:  Follow up with PCP in 1-2 weeks Please obtain CMP/CBC in one week Repeat Chest Xray in 4-6 week Assess whether patient requires home O2 at next visit.   Admitted From:  Home  Disposition: Home with home health services   Home Health: Yes  Equipment/Devices: Home O2-3 L with ambulation  Discharge Condition: Stable  CODE STATUS: FULL CODE  Diet recommendation:  Diet Order             Diet - low sodium heart healthy           Diet Carb Modified           Diet heart healthy/carb modified Room service appropriate? No; Fluid consistency: Thin  Diet effective now                    Brief Narrative: Patient is a 86 y.o. male with PMHx of CAD s/p CABG in 2016, HTN, HLD, PAF-who was diagnosed with COVID-19 on 6/10 (home test)-presented to the ED on 6/16 with worsening cough/shortness of breath-he was found to have superimposed bacterial pneumonia (elevated procalcitonin level)-and was subsequently admitted to the hospitalist service   COVID-19 vaccinated status: Vaccinated including booster   Significant Events: 6/19>> Admit to [facility] for presumed superimposed bacterial pneumonia on COVID-19 infection   Significant studies: 6/19>>Chest x-ray: Patchy bilateral airspace opacities. 6/23>> TTE: EF 70-75%   COVID-19 medications: Steroids: 6/19>> Remdesivir: 6/19>>   Antibiotics: Rocephin: 6/19>> Zithromax: 6/19 x 1   Microbiology data: 6/19>> blood culture: Streptococcus pneumoniae 6/21 >>blood culture: No growth   Procedures: None   Consults: None  Brief Hospital Course: Severe sepsis due to pneumococcal pneumonia with bacteremia: Sepsis physiology has resolved-treated with IV Rocephin-repeat  blood cultures on 6/21 negative so far.  He feels significantly better-and has been asking for discharge-sensitivities/culture results reviewed with infectious disease doctor-Dr. Comer-recommendations are to transition to amoxicillin on discharge.     Acute Hypoxic Resp Failure due to pneumococcal pneumonia +/- COVID pneumonia: Suspect that this is predominantly pneumococcal pneumonia causing hypoxemic respiratory failure-however unable to rule out some component of COVID-19 pneumonia as well.  Hypoxia has improved-he is now on room air at rest-but with ambulation does require around 3 L of oxygen.  Home O2 will be arranged-he will be discharged home on tapering steroids and amoxicillin.  Follow-up with his primary care practitioner-and reassess whether he requires home O2 at next visit.  COVID-19 Labs:  Recent Labs    12/30/20 0120 12/31/20 0043 01/01/21 0621  DDIMER 3.58* 3.26* 3.49*  CRP 18.3* 8.7* 4.5*    Lab Results  Component Value Date   SARSCOV2NAA POSITIVE (A) 12/27/2020     Elevated D-dimer: Downtrending-likely due to COVID-19-hypoxia improving-lower extremity Doppler was negative for DVT.  In any event-patient has been placed on Eliquis-for A. fib.   HTN: BP better but continues to fluctuate-continue amlodipine/Imdur and lisinopril.  Follow-up with PCP/cardiology for further optimization.     CAD s/p four-vessel CABG 2016: No anginal symptoms-continue Plavix, statin.  No longer on beta-blocker given prior history of second-degree AV block/junctional bradycardia (noted in 48-hour Holter 2019) per review of outpatient cardiology note.   PAF: Probably provoked by pneumonia-no history of CVA-Echo with stable EF-rate remains controlled-spoke with his primary cardiologist-Dr. Angelena Form on  6/24-chart reviewed-given that his SDH (traumatic) was in 2015-and he has had no further bleeding issues-recommendations were to stop Plavix and transition to Eliquis.  Subsequently this MD had a long  discussion with the patient's wife-risk/benefits were discussed-she was okay with anticoagulation as long as it was recommended by Dr. Angelena Form.   HLD: On statin   History of junctional bradycardia/second-degree AV block: Noted in 48-hour Holter-in 2019-continue telemetry monitoring-avoid rate control agents.   DM-2 (A1c 6.7 on 06/15/2020) with uncontrolled hyperglycemia due to steroids: CBGs stable-improved as steroid doses have been tapered down-resume close outpatient monitoring-does not appear to be on any oral diabetic agents-we will defer to PCP.    Procedures/Studies:   Discharge Diagnoses:  Active Problems:   Pneumonia due to COVID-19 virus   CAP (community acquired pneumonia)   Streptococcal bacteremia   Discharge Instructions:    Person Under Monitoring Name: [name]  Location: [address] [place] [address]   Infection Prevention Recommendations for Individuals Confirmed to have, or Being Evaluated for, 2019 Novel Coronavirus (COVID-19) Infection Who Receive Care at Home  Individuals who are confirmed to have, or are being evaluated for, COVID-19 should follow the prevention steps below until a healthcare provider or local or state health department says they can return to normal activities.  Stay home except to get medical care You should restrict activities outside your home, except for getting medical care. Do not go to work, school, or public areas, and do not use public transportation or taxis.  Call ahead before visiting your doctor Before your medical appointment, call the healthcare provider and tell them that you have, or are being evaluated for, COVID-19 infection. This will help the healthcare provider's office take steps to keep other people from getting infected. Ask your healthcare provider to call the local or state health department.  Monitor your symptoms Seek prompt medical attention if your illness is worsening (e.g.,  difficulty breathing). Before going to your medical appointment, call the healthcare provider and tell them that you have, or are being evaluated for, COVID-19 infection. Ask your healthcare provider to call the local or state health department.  Wear a facemask You should wear a facemask that covers your nose and mouth when you are in the same room with other people and when you visit a healthcare provider. People who live with or visit you should also wear a facemask while they are in the same room with you.  Separate yourself from other people in your home As much as possible, you should stay in a different room from other people in your home. Also, you should use a separate bathroom, if available.  Avoid sharing household items You should not share dishes, drinking glasses, cups, eating utensils, towels, bedding, or other items with other people in your home. After using these items, you should wash them thoroughly with soap and water.  Cover your coughs and sneezes Cover your mouth and nose with a tissue when you cough or sneeze, or you can cough or sneeze into your sleeve. Throw used tissues in a lined trash can, and immediately wash your hands with soap and water for at least 20 seconds or use an alcohol-based hand rub.  Wash your [facility] your hands often and thoroughly with soap and water for at least 20 seconds. You can use an alcohol-based hand sanitizer if soap and water are not available and if your hands are not visibly dirty. Avoid touching your eyes, nose, and mouth  with unwashed hands.   Prevention Steps for Caregivers and Household Members of Individuals Confirmed to have, or Being Evaluated for, COVID-19 Infection Being Cared for in the Home  If you live with, or provide care at home for, a person confirmed to have, or being evaluated for, COVID-19 infection please follow these guidelines to prevent infection:  Follow healthcare provider's instructions Make  sure that you understand and can help the patient follow any healthcare provider instructions for all care.  Provide for the patient's basic needs You should help the patient with basic needs in the home and provide support for getting groceries, prescriptions, and other personal needs.  Monitor the patient's symptoms If they are getting sicker, call his or her medical provider and tell them that the patient has, or is being evaluated for, COVID-19 infection. This will help the healthcare provider's office take steps to keep other people from getting infected. Ask the healthcare provider to call the local or state health department.  Limit the number of people who have contact with the patient If possible, have only one caregiver for the patient. Other household members should stay in another home or place of residence. If this is not possible, they should stay in another room, or be separated from the patient as much as possible. Use a separate bathroom, if available. Restrict visitors who do not have an essential need to be in the home.  Keep older adults, very young children, and other sick people away from the patient Keep older adults, very young children, and those who have compromised immune systems or chronic health conditions away from the patient. This includes people with chronic heart, lung, or kidney conditions, diabetes, and cancer.  Ensure good ventilation Make sure that shared spaces in the home have good air flow, such as from an air conditioner or an opened window, weather permitting.  Wash your hands often Wash your hands often and thoroughly with soap and water for at least 20 seconds. You can use an alcohol based hand sanitizer if soap and water are not available and if your hands are not visibly dirty. Avoid touching your eyes, nose, and mouth with unwashed hands. Use disposable paper towels to dry your hands. If not available, use dedicated cloth towels and replace  them when they become wet.  Wear a facemask and gloves Wear a disposable facemask at all times in the room and gloves when you touch or have contact with the patient's blood, body fluids, and/or secretions or excretions, such as sweat, saliva, sputum, nasal mucus, vomit, urine, or feces.  Ensure the mask fits over your nose and mouth tightly, and do not touch it during use. Throw out disposable facemasks and gloves after using them. Do not reuse. Wash your hands immediately after removing your facemask and gloves. If your personal clothing becomes contaminated, carefully remove clothing and launder. Wash your hands after handling contaminated clothing. Place all used disposable facemasks, gloves, and other waste in a lined container before disposing them with other household waste. Remove gloves and wash your hands immediately after handling these items.  Do not share dishes, glasses, or other household items with the patient Avoid sharing household items. You should not share dishes, drinking glasses, cups, eating utensils, towels, bedding, or other items with a patient who is confirmed to have, or being evaluated for, COVID-19 infection. After the person uses these items, you should wash them thoroughly with soap and water.  Wash laundry thoroughly Immediately remove and wash clothes  or bedding that have blood, body fluids, and/or secretions or excretions, such as sweat, saliva, sputum, nasal mucus, vomit, urine, or feces, on them. Wear gloves when handling laundry from the patient. Read and follow directions on labels of laundry or clothing items and detergent. In general, wash and dry with the warmest temperatures recommended on the label.  Clean all areas the individual has used often Clean all touchable surfaces, such as counters, tabletops, doorknobs, bathroom fixtures, toilets, phones, keyboards, tablets, and bedside tables, every day. Also, clean any surfaces that may have blood, body  fluids, and/or secretions or excretions on them. Wear gloves when cleaning surfaces the patient has come in contact with. Use a diluted bleach solution (e.g., dilute bleach with 1 part bleach and 10 parts water) or a household disinfectant with a label that says EPA-registered for coronaviruses. To make a bleach solution at home, add 1 tablespoon of bleach to 1 quart (4 cups) of water. For a larger supply, add  cup of bleach to 1 gallon (16 cups) of water. Read labels of cleaning products and follow recommendations provided on product labels. Labels contain instructions for safe and effective use of the cleaning product including precautions you should take when applying the product, such as wearing gloves or eye protection and making sure you have good ventilation during use of the product. Remove gloves and wash hands immediately after cleaning.  Monitor yourself for signs and symptoms of illness Caregivers and household members are considered close contacts, should monitor their health, and will be asked to limit movement outside of the home to the extent possible. Follow the monitoring steps for close contacts listed on the symptom monitoring form.   ? If you have additional questions, contact your local health department or call the epidemiologist on call at [phone] (available 24/7). ? This guidance is subject to change. For the most up-to-date guidance from CDC, please refer to their website: YouBlogs.pl    Activity:  As tolerated with Full fall precautions use walker/cane & assistance as needed  Discharge Instructions     Call MD for:  difficulty breathing, headache or visual disturbances   Complete by: As directed    Diet - low sodium heart healthy   Complete by: As directed    Diet Carb Modified   Complete by: As directed    Discharge instructions   Complete by: As directed    1.)  21 days of isolation from  the day of your first symptoms or from first positive COVID test (6/10)  2.)  You have been started on blood thinner-if you sustain a major fall/head trauma or if you experience bloody stools/black stools or if you vomit blood please seek immediate medical attention.  3.)  You require oxygen mostly with ambulation-please ask your primary care practitioner at next visit to see if you still require oxygen.   Follow with Primary MD  Jinny Sanders, MD in 1-2 weeks  Please get a complete blood count and chemistry panel checked by your Primary MD at your next visit, and again as instructed by your Primary MD.  Get Medicines reviewed and adjusted: Please take all your medications with you for your next visit with your Primary MD  Laboratory/radiological data: Please request your Primary MD to go over all hospital tests and procedure/radiological results at the follow up, please ask your Primary MD to get all Hospital records sent to his/her office.  In some cases, they will be blood work, cultures and biopsy results  pending at the time of your discharge. Please request that your primary care M.D. follows up on these results.  Also Note the following: If you experience worsening of your admission symptoms, develop shortness of breath, life threatening emergency, suicidal or homicidal thoughts you must seek medical attention immediately by calling 911 or calling your MD immediately  if symptoms less severe.  You must read complete instructions/literature along with all the possible adverse reactions/side effects for all the Medicines you take and that have been prescribed to you. Take any new Medicines after you have completely understood and accpet all the possible adverse reactions/side effects.   Do not drive when taking Pain medications or sleeping medications (Benzodaizepines)  Do not take more than prescribed Pain, Sleep and Anxiety Medications. It is not advisable to combine anxiety,sleep and  pain medications without talking with your primary care practitioner  Special Instructions: If you have smoked or chewed Tobacco  in the last 2 yrs please stop smoking, stop any regular Alcohol  and or any Recreational drug use.  Wear Seat belts while driving.  Please note: You were cared for by a hospitalist during your hospital stay. Once you are discharged, your primary care physician will handle any further medical issues. Please note that NO REFILLS for any discharge medications will be authorized once you are discharged, as it is imperative that you return to your primary care physician (or establish a relationship with a primary care physician if you do not have one) for your post hospital discharge needs so that they can reassess your need for medications and monitor your lab values.   Increase activity slowly   Complete by: As directed       Allergies as of 01/01/2021       Reactions   Protamine Rash   Hypotension, Rash, unstable vitals.    Statins Other (See Comments)   Caused soreness (Lipitor, Zocor)   Adhesive [tape] Hives   Rosuvastatin Calcium Other (See Comments)   Caused soreness (Lipitor, Zocor) 12/28/2020 Pt can tolerate low dose   Imodium [loperamide] Itching, Rash        Medication List     STOP taking these medications    clopidogrel 75 MG tablet Commonly known as: PLAVIX       TAKE these medications    acetaminophen 500 MG tablet Commonly known as: TYLENOL Take 500-1,000 mg by mouth 2 (two) times daily as needed (for pain).   albuterol 108 (90 Base) MCG/ACT inhaler Commonly known as: VENTOLIN HFA Inhale 2 puffs into the lungs every 6 (six) hours as needed for wheezing or shortness of breath.   amLODipine 5 MG tablet Commonly known as: NORVASC Take 2 tablets (10 mg total) by mouth daily. What changed: how much to take   amoxicillin 500 MG capsule Commonly known as: AMOXIL Take 1 capsule (500 mg total) by mouth 3 (three) times daily for 4  days.   apixaban 5 MG Tabs tablet Commonly known as: ELIQUIS Take 1 tablet (5 mg total) by mouth 2 (two) times daily.   benzonatate 100 MG capsule Commonly known as: Tessalon Perles Take 1 capsule (100 mg total) by mouth 3 (three) times daily as needed for cough.   cholecalciferol 25 MCG (1000 UNIT) tablet Commonly known as: VITAMIN D3 Take 1,000 Units by mouth daily.   isosorbide mononitrate 30 MG 24 hr tablet Commonly known as: IMDUR Take 1 tablet by mouth once daily   lisinopril 40 MG tablet Commonly known as: ZESTRIL Take  1 tablet by mouth once daily   nitroGLYCERIN 0.4 MG SL tablet Commonly known as: NITROSTAT Place 1 tablet (0.4 mg total) under the tongue every 5 (five) minutes as needed for chest pain.   ONE-A-DAY MENS HEALTH FORMULA PO Take 1 tablet by mouth daily.   pantoprazole 40 MG tablet Commonly known as: PROTONIX Take 1 tablet by mouth once daily   predniSONE 10 MG tablet Commonly known as: DELTASONE Take 40 mg daily for 1 day, 30 mg daily for 1 day, 20 mg daily for 1 days,10 mg daily for 1 day, then stop   rosuvastatin 5 MG tablet Commonly known as: CRESTOR Take 1 tablet (5 mg total) by mouth daily.   vitamin B-12 1000 MCG tablet Commonly known as: CYANOCOBALAMIN Take 1,000 mcg by mouth daily.               Durable Medical Equipment  (From admission, onward)           Start     Ordered   01/01/21 1028  DME Oxygen  Once       Question Answer Comment  Length of Need 6 Months   Mode or (Route) Nasal cannula   Liters per Minute 3   Frequency Continuous (stationary and portable oxygen unit needed)   Oxygen conserving device Yes   Oxygen delivery system Gas      01/01/21 1030            Follow-up Information     [facility] Follow up.   Specialty: Home Health Services Why: someone from [facility] will contact youo to arrange start date and time for your therapy. Contact information: [place] [address] [phone]         Jinny Sanders, MD. Schedule an appointment as soon as possible for a visit in 1 week(s).   Specialty: Family Medicine Why: Hospital follow up Contact information: [place] [address] [phone]         Burnell Blanks, MD Follow up in 22 week(s).   Specialty: Cardiology Contact information: [address] [place] [address] [phone]         Imogene Burn, PA-C Follow up on 02/09/2021.   Specialty: Cardiology Why: appt at 1:45 pm, Hospital follow up Contact information: [address] [place] [address] [phone]                Allergies  Allergen Reactions   Protamine Rash    Hypotension, Rash, unstable vitals.     Statins Other (See Comments)    Caused soreness (Lipitor, Zocor)   Adhesive [Tape] Hives   Rosuvastatin Calcium Other (See Comments)    Caused soreness (Lipitor, Zocor) 12/28/2020 Pt can tolerate low dose   Imodium [Loperamide] Itching and Rash     Other Procedures/Studies: DG Chest Port 1 View  Result Date: 12/27/2020 CLINICAL DATA:  Hypoxia and cough, COVID-19 positivity EXAM: PORTABLE CHEST 1 VIEW COMPARISON:  04/21/2016 FINDINGS: Cardiac shadow is prominent but stable. Large hiatal hernia is again identified. Postsurgical changes are seen. Patchy airspace opacities are noted in the bases bilaterally likely related to the patient's given clinical history. No bony abnormality is noted. IMPRESSION: Patchy bibasilar airspace opacities consistent with the given clinical history. Electronically Signed   By: Inez Catalina M.D.   On: 12/27/2020 22:45   VAS [place] LOWER EXTREMITY VENOUS (DVT)  Result Date:  12/31/2020  Lower Venous DVT Study Patient Name:  [name]  Date of Exam:   12/31/2020 Medical Rec #: [id number]      Accession #:    [id number] Date of Birth: [birth year]      Patient Gender: M Patient Age:   89H Exam  [facility]:  [facility] Procedure:      VAS [place] LOWER EXTREMITY VENOUS (DVT) Referring Phys: Bangs --------------------------------------------------------------------------------  Indications: Covid.  Comparison Study: No prior study Performing Technologist: Sharion Dove RVS  Examination Guidelines: A complete evaluation includes B-mode imaging, spectral Doppler, color Doppler, and power Doppler as needed of all accessible portions of each vessel. Bilateral testing is considered an integral part of a complete examination. Limited examinations for reoccurring indications may be performed as noted. The reflux portion of the exam is performed with the patient in reverse Trendelenburg.  +---------+---------------+---------+-----------+----------+--------------+ RIGHT    CompressibilityPhasicitySpontaneityPropertiesThrombus Aging +---------+---------------+---------+-----------+----------+--------------+ CFV      Full           Yes      Yes                                 +---------+---------------+---------+-----------+----------+--------------+ SFJ      Full                                                        +---------+---------------+---------+-----------+----------+--------------+ FV Prox  Full                                                        +---------+---------------+---------+-----------+----------+--------------+ FV Mid   Full                                                        +---------+---------------+---------+-----------+----------+--------------+ FV DistalFull                                                        +---------+---------------+---------+-----------+----------+--------------+ PFV      Full                                                        +---------+---------------+---------+-----------+----------+--------------+ POP      Full           Yes      Yes                                  +---------+---------------+---------+-----------+----------+--------------+ PTV      Full                                                        +---------+---------------+---------+-----------+----------+--------------+  PERO     Full                                                        +---------+---------------+---------+-----------+----------+--------------+   +---------+---------------+---------+-----------+----------+--------------+ LEFT     CompressibilityPhasicitySpontaneityPropertiesThrombus Aging +---------+---------------+---------+-----------+----------+--------------+ CFV      Full           Yes      Yes                                 +---------+---------------+---------+-----------+----------+--------------+ SFJ      Full                                                        +---------+---------------+---------+-----------+----------+--------------+ FV Prox  Full                                                        +---------+---------------+---------+-----------+----------+--------------+ FV Mid   Full                                                        +---------+---------------+---------+-----------+----------+--------------+ FV DistalFull                                                        +---------+---------------+---------+-----------+----------+--------------+ PFV      Full                                                        +---------+---------------+---------+-----------+----------+--------------+ POP      Full           Yes      Yes                                 +---------+---------------+---------+-----------+----------+--------------+ PTV      Full                                                        +---------+---------------+---------+-----------+----------+--------------+ PERO     Full                                                         +---------+---------------+---------+-----------+----------+--------------+  Summary: BILATERAL: - No evidence of deep vein thrombosis seen in the lower extremities, bilaterally. -   *See table(s) above for measurements and observations. Electronically signed by Monica Martinez MD on 12/31/2020 at 6:15:01 PM.    Final    ECHOCARDIOGRAM LIMITED  Result Date: 12/31/2020    ECHOCARDIOGRAM LIMITED REPORT   Patient Name:   [name] Date of Exam: 12/31/2020 Medical Rec #:  [id number]     Height:       67.0 in Accession #:    [id number]    Weight:       175.0 lb Date of Birth:  [birth year]     BSA:          1.911 m Patient Age:    12 years      BP:           184/93 mmHg Patient Gender: M             HR:           79 bpm. Exam Location:  Inpatient Procedure: Limited Echo, Limited Color Doppler and Cardiac Doppler Indications:    atrial fibrillation  History:        Patient has prior history of Echocardiogram examinations, most                 recent 08/16/2019. Prior CABG, covid pneumonia,                 Signs/Symptoms:Bacteremia; Risk Factors:Dyslipidemia,                 Hypertension and Diabetes.  Sonographer:    Johny Chess Referring Phys: [place]  1. Left ventricular ejection fraction, by estimation, is 70 to 75%. The left ventricle has hyperdynamic function. The left ventricle has no regional wall motion abnormalities. There is moderate left ventricular hypertrophy. Left ventricular diastolic function could not be evaluated.  2. Right ventricular systolic function is normal. The right ventricular size is normal. There is normal pulmonary artery systolic pressure.  3. The mitral valve is normal in structure. Trivial mitral valve regurgitation. No evidence of mitral stenosis.  4. The aortic valve is tricuspid. Aortic valve regurgitation is trivial. Mild aortic valve sclerosis is present, with no evidence of aortic valve stenosis.  5. The inferior vena cava is normal in size with  greater than 50% respiratory variability, suggesting right atrial pressure of 3 mmHg. FINDINGS  Left Ventricle: Left ventricular ejection fraction, by estimation, is 70 to 75%. The left ventricle has hyperdynamic function. The left ventricle has no regional wall motion abnormalities. The left ventricular internal cavity size was normal in size. There is moderate left ventricular hypertrophy. Left ventricular diastolic function could not be evaluated. Left ventricular diastolic function could not be evaluated due to atrial fibrillation. Right Ventricle: The right ventricular size is normal. Right ventricular systolic function is normal. There is normal pulmonary artery systolic pressure. The tricuspid regurgitant velocity is 2.82 m/s, and with an assumed right atrial pressure of 3 mmHg,  the estimated right ventricular systolic pressure is 76.2 mmHg. Left Atrium: Left atrial size was normal in size. Right Atrium: Right atrial size was normal in size. Pericardium: There is no evidence of pericardial effusion. Mitral Valve: The mitral valve is normal in structure. Mild mitral annular calcification. Trivial mitral valve regurgitation. No evidence of mitral valve stenosis. Tricuspid Valve: The tricuspid valve is normal in structure. Tricuspid valve regurgitation is mild . No evidence of tricuspid stenosis. Aortic  Valve: The aortic valve is tricuspid. Aortic valve regurgitation is trivial. Mild aortic valve sclerosis is present, with no evidence of aortic valve stenosis. Pulmonic Valve: The pulmonic valve was normal in structure. Pulmonic valve regurgitation is trivial. No evidence of pulmonic stenosis. Aorta: The aortic root is normal in size and structure. Venous: The inferior vena cava is normal in size with greater than 50% respiratory variability, suggesting right atrial pressure of 3 mmHg. IAS/Shunts: No atrial level shunt detected by color flow Doppler. LEFT VENTRICLE PLAX 2D LVIDd:         4.10 cm LVIDs:          2.80 cm LV PW:         1.40 cm LV IVS:        1.20 cm LVOT diam:     2.00 cm LVOT Area:     3.14 cm  IVC IVC diam: 2.30 cm LEFT ATRIUM         Index LA diam:    4.20 cm 2.20 cm/m   AORTA Ao Asc diam: 3.40 cm TRICUSPID VALVE TR Peak grad:   31.8 mmHg TR Vmax:        282.00 cm/s  SHUNTS Systemic Diam: 2.00 cm Kirk Ruths MD Electronically signed by Kirk Ruths MD Signature Date/Time: 12/31/2020/12:00:24 PM    Final      TODAY-DAY OF DISCHARGE:  Subjective:   [name] today has no headache,no chest abdominal pain,no new weakness tingling or numbness, feels much better wants to go home today.  Objective:   Blood pressure (!) 164/88, pulse 73, temperature (!) 97.5 F (36.4 C), temperature source Oral, resp. rate 20, height 5\' 7"  (1.702 m), weight 79.4 kg, SpO2 96 %.  Intake/Output Summary (Last 24 hours) at 01/01/2021 1034 Last data filed at 01/01/2021 0809 Gross per 24 hour  Intake 380 ml  Output 1400 ml  Net -1020 ml   Filed Weights   12/27/20 2130  Weight: 79.4 kg    Exam: Awake Alert, Oriented *3, No new F.N deficits, Normal affect [place].AT,PERRAL Supple Neck,No JVD, No cervical lymphadenopathy appriciated.  Symmetrical Chest wall movement, Good air movement bilaterally, CTAB RRR,No Gallops,Rubs or new Murmurs, No Parasternal Heave +ve B.Sounds, Abd Soft, Non tender, No organomegaly appriciated, No rebound -guarding or rigidity. No Cyanosis, Clubbing or edema, No new Rash or bruise   PERTINENT RADIOLOGIC STUDIES: DG Chest Port 1 View  Result Date: 12/27/2020 CLINICAL DATA:  Hypoxia and cough, COVID-19 positivity EXAM: PORTABLE CHEST 1 VIEW COMPARISON:  04/21/2016 FINDINGS: Cardiac shadow is prominent but stable. Large hiatal hernia is again identified. Postsurgical changes are seen. Patchy airspace opacities are noted in the bases bilaterally likely related to the patient's given clinical history. No bony abnormality is noted. IMPRESSION: Patchy bibasilar airspace opacities  consistent with the given clinical history. Electronically Signed   By: Inez Catalina M.D.   On: 12/27/2020 22:45   VAS [place] LOWER EXTREMITY VENOUS (DVT)  Result Date: 12/31/2020  Lower Venous DVT Study Patient Name:  CYLE KENYON  Date of Exam:   12/31/2020 Medical Rec #: [id number]      Accession #:    [id number] Date of Birth: Nov 01, 1934      Patient Gender: M Patient Age:   89I Exam Location:  [facility] Procedure:      VAS [place] LOWER EXTREMITY VENOUS (DVT) Referring Phys: [place] --------------------------------------------------------------------------------  Indications: Covid.  Comparison Study: No prior study Performing Technologist: Sharion Dove RVS  Examination  Guidelines: A complete evaluation includes B-mode imaging, spectral Doppler, color Doppler, and power Doppler as needed of all accessible portions of each vessel. Bilateral testing is considered an integral part of a complete examination. Limited examinations for reoccurring indications may be performed as noted. The reflux portion of the exam is performed with the patient in reverse Trendelenburg.  +---------+---------------+---------+-----------+----------+--------------+ RIGHT    CompressibilityPhasicitySpontaneityPropertiesThrombus Aging +---------+---------------+---------+-----------+----------+--------------+ CFV      Full           Yes      Yes                                 +---------+---------------+---------+-----------+----------+--------------+ SFJ      Full                                                        +---------+---------------+---------+-----------+----------+--------------+ FV Prox  Full                                                        +---------+---------------+---------+-----------+----------+--------------+ FV Mid   Full                                                        +---------+---------------+---------+-----------+----------+--------------+ FV  DistalFull                                                        +---------+---------------+---------+-----------+----------+--------------+ PFV      Full                                                        +---------+---------------+---------+-----------+----------+--------------+ POP      Full           Yes      Yes                                 +---------+---------------+---------+-----------+----------+--------------+ PTV      Full                                                        +---------+---------------+---------+-----------+----------+--------------+ PERO     Full                                                        +---------+---------------+---------+-----------+----------+--------------+   +---------+---------------+---------+-----------+----------+--------------+  LEFT     CompressibilityPhasicitySpontaneityPropertiesThrombus Aging +---------+---------------+---------+-----------+----------+--------------+ CFV      Full           Yes      Yes                                 +---------+---------------+---------+-----------+----------+--------------+ SFJ      Full                                                        +---------+---------------+---------+-----------+----------+--------------+ FV Prox  Full                                                        +---------+---------------+---------+-----------+----------+--------------+ FV Mid   Full                                                        +---------+---------------+---------+-----------+----------+--------------+ FV DistalFull                                                        +---------+---------------+---------+-----------+----------+--------------+ PFV      Full                                                        +---------+---------------+---------+-----------+----------+--------------+ POP      Full           Yes      Yes                                  +---------+---------------+---------+-----------+----------+--------------+ PTV      Full                                                        +---------+---------------+---------+-----------+----------+--------------+ PERO     Full                                                        +---------+---------------+---------+-----------+----------+--------------+     Summary: BILATERAL: - No evidence of deep vein thrombosis seen in the lower extremities, bilaterally. -   *See table(s) above for measurements and observations. Electronically signed by Monica Martinez MD on 12/31/2020 at 6:15:01 PM.    Final  ECHOCARDIOGRAM LIMITED  Result Date: 12/31/2020    ECHOCARDIOGRAM LIMITED REPORT   Patient Name:   [name] Date of Exam: 12/31/2020 Medical Rec #:  [id number]     Height:       67.0 in Accession #:    [id number]    Weight:       175.0 lb Date of Birth:  15-Nov-1934     BSA:          1.911 m Patient Age:    15 years      BP:           184/93 mmHg Patient Gender: M             HR:           79 bpm. Exam Location:  Inpatient Procedure: Limited Echo, Limited Color Doppler and Cardiac Doppler Indications:    atrial fibrillation  History:        Patient has prior history of Echocardiogram examinations, most                 recent 08/16/2019. Prior CABG, covid pneumonia,                 Signs/Symptoms:Bacteremia; Risk Factors:Dyslipidemia,                 Hypertension and Diabetes.  Sonographer:    Johny Chess Referring Phys: [place]  1. Left ventricular ejection fraction, by estimation, is 70 to 75%. The left ventricle has hyperdynamic function. The left ventricle has no regional wall motion abnormalities. There is moderate left ventricular hypertrophy. Left ventricular diastolic function could not be evaluated.  2. Right ventricular systolic function is normal. The right ventricular size is normal. There is normal pulmonary artery systolic  pressure.  3. The mitral valve is normal in structure. Trivial mitral valve regurgitation. No evidence of mitral stenosis.  4. The aortic valve is tricuspid. Aortic valve regurgitation is trivial. Mild aortic valve sclerosis is present, with no evidence of aortic valve stenosis.  5. The inferior vena cava is normal in size with greater than 50% respiratory variability, suggesting right atrial pressure of 3 mmHg. FINDINGS  Left Ventricle: Left ventricular ejection fraction, by estimation, is 70 to 75%. The left ventricle has hyperdynamic function. The left ventricle has no regional wall motion abnormalities. The left ventricular internal cavity size was normal in size. There is moderate left ventricular hypertrophy. Left ventricular diastolic function could not be evaluated. Left ventricular diastolic function could not be evaluated due to atrial fibrillation. Right Ventricle: The right ventricular size is normal. Right ventricular systolic function is normal. There is normal pulmonary artery systolic pressure. The tricuspid regurgitant velocity is 2.82 m/s, and with an assumed right atrial pressure of 3 mmHg,  the estimated right ventricular systolic pressure is 27.7 mmHg. Left Atrium: Left atrial size was normal in size. Right Atrium: Right atrial size was normal in size. Pericardium: There is no evidence of pericardial effusion. Mitral Valve: The mitral valve is normal in structure. Mild mitral annular calcification. Trivial mitral valve regurgitation. No evidence of mitral valve stenosis. Tricuspid Valve: The tricuspid valve is normal in structure. Tricuspid valve regurgitation is mild . No evidence of tricuspid stenosis. Aortic Valve: The aortic valve is tricuspid. Aortic valve regurgitation is trivial. Mild aortic valve sclerosis is present, with no evidence of aortic valve stenosis. Pulmonic Valve: The pulmonic valve was normal in structure. Pulmonic valve regurgitation is trivial. No evidence of pulmonic  stenosis. Aorta: The aortic root is normal in size and structure. Venous: The inferior vena cava is normal in size with greater than 50% respiratory variability, suggesting right atrial pressure of 3 mmHg. IAS/Shunts: No atrial level shunt detected by color flow Doppler. LEFT VENTRICLE PLAX 2D LVIDd:         4.10 cm LVIDs:         2.80 cm LV PW:         1.40 cm LV IVS:        1.20 cm LVOT diam:     2.00 cm LVOT Area:     3.14 cm  IVC IVC diam: 2.30 cm LEFT ATRIUM         Index LA diam:    4.20 cm 2.20 cm/m   AORTA Ao Asc diam: 3.40 cm TRICUSPID VALVE TR Peak grad:   31.8 mmHg TR Vmax:        282.00 cm/s  SHUNTS Systemic Diam: 2.00 cm Kirk Ruths MD Electronically signed by Kirk Ruths MD Signature Date/Time: 12/31/2020/12:00:24 PM    Final      PERTINENT LAB RESULTS: CBC: Recent Labs    12/31/20 0043 01/01/21 0621  WBC 7.6 11.4*  HGB 11.7* 12.7*  HCT 34.4* 37.1*  PLT 259 307   CMET CMP     Component Value Date/Time   NA 134 (L) 01/01/2021 0621   NA 144 09/24/2018 1148   K 3.5 01/01/2021 0621   CL 103 01/01/2021 0621   CO2 26 01/01/2021 0621   GLUCOSE 154 (H) 01/01/2021 0621   BUN 23 01/01/2021 0621   BUN 15 09/24/2018 1148   CREATININE 0.76 01/01/2021 0621   CREATININE 0.86 06/28/2016 0905   CALCIUM 8.2 (L) 01/01/2021 0621   PROT 5.2 (L) 01/01/2021 0621   PROT 6.8 09/22/2016 0912   ALBUMIN 2.5 (L) 01/01/2021 0621   ALBUMIN 4.4 09/22/2016 0912   AST 24 01/01/2021 0621   ALT 51 (H) 01/01/2021 0621   ALKPHOS 46 01/01/2021 0621   BILITOT 0.5 01/01/2021 0621   BILITOT 0.4 09/22/2016 0912   GFRNONAA >60 01/01/2021 0621   GFRAA 92 09/24/2018 1148    GFR Estimated Creatinine Clearance: 69.4 mL/min (by C-G formula based on SCr of 0.76 mg/dL). No results for input(s): LIPASE, AMYLASE in the last 72 hours. No results for input(s): CKTOTAL, CKMB, CKMBINDEX, TROPONINI in the last 72 hours. Invalid input(s): [place]    12/31/20 0043 01/01/21 0621  DDIMER 3.26*  3.49*   No results for input(s): HGBA1C in the last 72 hours. No results for input(s): CHOL, HDL, LDLCALC, TRIG, CHOLHDL, LDLDIRECT in the last 72 hours. No results for input(s): TSH, T4TOTAL, T3FREE, THYROIDAB in the last 72 hours.  Invalid input(s): FREET3 No results for input(s): VITAMINB12, FOLATE, FERRITIN, TIBC, IRON, RETICCTPCT in the last 72 hours. Coags: No results for input(s): INR in the last 72 hours.  Invalid input(s): PT Microbiology: Recent Results (from the past 240 hour(s))  Blood Culture (routine x 2)     Status: Abnormal   Collection Time: 12/27/20 10:30 PM   Specimen: BLOOD  Result Value Ref Range Status   Specimen Description BLOOD RIGHT ANTECUBITAL  Final   Special Requests   Final    BOTTLES DRAWN AEROBIC AND ANAEROBIC Blood Culture adequate volume   Culture  Setup Time   Final    GRAM POSITIVE COCCI IN BOTH AEROBIC AND ANAEROBIC BOTTLES CRITICAL VALUE NOTED.  VALUE IS CONSISTENT WITH PREVIOUSLY REPORTED AND CALLED VALUE.  Culture (A)  Final    STREPTOCOCCUS PNEUMONIAE SUSCEPTIBILITIES PERFORMED ON PREVIOUS CULTURE WITHIN THE LAST 5 DAYS. Performed at [facility], [place] [address] [place], [place] [address]    Report Status 12/30/2020 FINAL  Final  Blood Culture (routine x 2)     Status: Abnormal   Collection Time: 12/27/20 10:40 PM   Specimen: BLOOD RIGHT HAND  Result Value Ref Range Status   Specimen Description BLOOD RIGHT HAND  Final   Special Requests   Final    BOTTLES DRAWN AEROBIC AND ANAEROBIC Blood Culture results may not be optimal due to an inadequate volume of blood received in culture bottles   Culture  Setup Time   Final    GRAM POSITIVE COCCI IN BOTH AEROBIC AND ANAEROBIC BOTTLES CRITICAL RESULT CALLED TO, READ BACK BY AND VERIFIED WITH: Amada Kingfisher CHEN [id number] FCP Performed at [facility], [place] [address], [place], [place] [address]    Culture STREPTOCOCCUS PNEUMONIAE (A)  Final   Report Status 12/30/2020 FINAL   Final   Organism ID, Bacteria STREPTOCOCCUS PNEUMONIAE  Final      Susceptibility   Streptococcus pneumoniae - MIC*    ERYTHROMYCIN <=0.12 SENSITIVE Sensitive     LEVOFLOXACIN 1 SENSITIVE Sensitive     VANCOMYCIN 0.5 SENSITIVE Sensitive     PENICILLIN (meningitis) <=0.06 SENSITIVE Sensitive     PENO - penicillin <=0.06      PENICILLIN (non-meningitis) <=0.06 SENSITIVE Sensitive     PENICILLIN (oral) <=0.06 SENSITIVE Sensitive     CEFTRIAXONE (non-meningitis) <=0.12 SENSITIVE Sensitive     CEFTRIAXONE (meningitis) <=0.12 SENSITIVE Sensitive     * STREPTOCOCCUS PNEUMONIAE  Blood Culture ID Panel (Reflexed)     Status: Abnormal   Collection Time: 12/27/20 10:40 PM  Result Value Ref Range Status   Enterococcus faecalis NOT DETECTED NOT DETECTED Final   Enterococcus Faecium NOT DETECTED NOT DETECTED Final   Listeria monocytogenes NOT DETECTED NOT DETECTED Final   Staphylococcus species NOT DETECTED NOT DETECTED Final   Staphylococcus aureus (BCID) NOT DETECTED NOT DETECTED Final   Staphylococcus epidermidis NOT DETECTED NOT DETECTED Final   Staphylococcus lugdunensis NOT DETECTED NOT DETECTED Final   Streptococcus species DETECTED (A) NOT DETECTED Final    Comment: CRITICAL RESULT CALLED TO, READ BACK BY AND VERIFIED WITH: PHARMD L CHEN [id number] FCP    Streptococcus agalactiae NOT DETECTED NOT DETECTED Final   Streptococcus pneumoniae DETECTED (A) NOT DETECTED Final    Comment: CRITICAL RESULT CALLED TO, READ BACK BY AND VERIFIED WITH: PHARMD L CHEN [id number] FCP    Streptococcus pyogenes NOT DETECTED NOT DETECTED Final   A.calcoaceticus-baumannii NOT DETECTED NOT DETECTED Final   Bacteroides fragilis NOT DETECTED NOT DETECTED Final   Enterobacterales NOT DETECTED NOT DETECTED Final   Enterobacter cloacae complex NOT DETECTED NOT DETECTED Final   Escherichia coli NOT DETECTED NOT DETECTED Final   Klebsiella aerogenes NOT DETECTED NOT DETECTED Final   Klebsiella oxytoca NOT  DETECTED NOT DETECTED Final   Klebsiella pneumoniae NOT DETECTED NOT DETECTED Final   Proteus species NOT DETECTED NOT DETECTED Final   Salmonella species NOT DETECTED NOT DETECTED Final   Serratia marcescens NOT DETECTED NOT DETECTED Final   Haemophilus influenzae NOT DETECTED NOT DETECTED Final   Neisseria meningitidis NOT DETECTED NOT DETECTED Final   Pseudomonas aeruginosa NOT DETECTED NOT DETECTED Final   Stenotrophomonas maltophilia NOT DETECTED NOT DETECTED Final   Candida albicans NOT DETECTED NOT DETECTED Final  Candida auris NOT DETECTED NOT DETECTED Final   Candida glabrata NOT DETECTED NOT DETECTED Final   Candida krusei NOT DETECTED NOT DETECTED Final   Candida parapsilosis NOT DETECTED NOT DETECTED Final   Candida tropicalis NOT DETECTED NOT DETECTED Final   Cryptococcus neoformans/gattii NOT DETECTED NOT DETECTED Final    Comment: Performed at [facility], [place] [address] [place], [place] [address]  Resp Panel by RT-PCR (Flu A&B, Covid) Nasopharyngeal Swab     Status: Abnormal   Collection Time: 12/27/20 10:58 PM   Specimen: Nasopharyngeal Swab; Nasopharyngeal(NP) swabs in vial transport medium  Result Value Ref Range Status   SARS Coronavirus 2 by RT PCR POSITIVE (A) NEGATIVE Final    Comment: RESULT CALLED TO, READ BACK BY AND VERIFIED WITH: RN TATE GRACE BY MESSAN H. AT 2358 ON 6 19 2022 (NOTE) SARS-CoV-2 target nucleic acids are DETECTED.  The SARS-CoV-2 RNA is generally detectable in upper respiratory specimens during the acute phase of infection. Positive results are indicative of the presence of the identified virus, but do not rule out bacterial infection or co-infection with other pathogens not detected by the test. Clinical correlation with patient history and other diagnostic information is necessary to determine patient infection status. The expected result is Negative.  Fact Sheet for Patients: EntrepreneurPulse.com.au  Fact  Sheet for Healthcare Providers: IncredibleEmployment.be  This test is not yet approved or cleared by the [place] FDA and  has been authorized for detection and/or diagnosis of SARS-CoV-2 by FDA under an Emergency Use Authorization (EUA).  This EUA will remain in effect (meaning th is test can be used) for the duration of  the COVID-19 declaration under Section 564(b)(1) of the Act, 21 U.S.C. section 360bbb-3(b)(1), unless the authorization is terminated or revoked sooner.     Influenza A by PCR NEGATIVE NEGATIVE Final   Influenza B by PCR NEGATIVE NEGATIVE Final    Comment: (NOTE) The Xpert Xpress SARS-CoV-2/FLU/RSV plus assay is intended as an aid in the diagnosis of influenza from Nasopharyngeal swab specimens and should not be used as a sole basis for treatment. Nasal washings and aspirates are unacceptable for Xpert Xpress SARS-CoV-2/FLU/RSV testing.  Fact Sheet for Patients: EntrepreneurPulse.com.au  Fact Sheet for Healthcare Providers: IncredibleEmployment.be  This test is not yet approved or cleared by the [place] FDA and has been authorized for detection and/or diagnosis of SARS-CoV-2 by FDA under an Emergency Use Authorization (EUA). This EUA will remain in effect (meaning this test can be used) for the duration of the COVID-19 declaration under Section 564(b)(1) of the Act, 21 U.S.C. section 360bbb-3(b)(1), unless the authorization is terminated or revoked.  Performed at [facility], [place] [address] [place], [place] [address]   MRSA Next Gen by PCR, Nasal     Status: None   Collection Time: 12/28/20  6:49 PM   Specimen: Nasal Mucosa; Nasal Swab  Result Value Ref Range Status   MRSA by PCR Next Gen NOT DETECTED NOT DETECTED Final    Comment: (NOTE) The GeneXpert MRSA Assay (FDA approved for NASAL specimens only), is one component of a comprehensive MRSA colonization surveillance program. It  is not intended to diagnose MRSA infection nor to guide or monitor treatment for MRSA infections. Test performance is not FDA approved in patients less than 6 years old. Performed at [facility], [place] [address] [place], [place] [address]   Culture, blood (routine x 2)     Status: None (Preliminary result)  Collection Time: 12/29/20 11:28 AM   Specimen: BLOOD  Result Value Ref Range Status   Specimen Description BLOOD LEFT ANTECUBITAL  Final   Special Requests   Final    BOTTLES DRAWN AEROBIC AND ANAEROBIC Blood Culture results may not be optimal due to an inadequate volume of blood received in culture bottles   Culture   Final    NO GROWTH 2 DAYS Performed at [facility], [place] [address] [place], [place] [address]    Report Status PENDING  Incomplete  Culture, blood (routine x 2)     Status: None (Preliminary result)   Collection Time: 12/29/20 11:28 AM   Specimen: BLOOD  Result Value Ref Range Status   Specimen Description BLOOD LEFT ANTECUBITAL  Final   Special Requests   Final    BOTTLES DRAWN AEROBIC ONLY Blood Culture adequate volume   Culture   Final    NO GROWTH 2 DAYS Performed at [facility], [place] [address] [place], [place] [address]    Report Status PENDING  Incomplete    FURTHER DISCHARGE INSTRUCTIONS:  Get Medicines reviewed and adjusted: Please take all your medications with you for your next visit with your Primary MD  Laboratory/radiological data: Please request your Primary MD to go over all hospital tests and procedure/radiological results at the follow up, please ask your Primary MD to get all Hospital records sent to his/her office.  In some cases, they will be blood work, cultures and biopsy results pending at the time of your discharge. Please request that your primary care M.D. goes through all the records of your hospital data and follows up on these results.  Also Note the following: If you experience worsening of your  admission symptoms, develop shortness of breath, life threatening emergency, suicidal or homicidal thoughts you must seek medical attention immediately by calling 911 or calling your MD immediately  if symptoms less severe.  You must read complete instructions/literature along with all the possible adverse reactions/side effects for all the Medicines you take and that have been prescribed to you. Take any new Medicines after you have completely understood and accpet all the possible adverse reactions/side effects.   Do not drive when taking Pain medications or sleeping medications (Benzodaizepines)  Do not take more than prescribed Pain, Sleep and Anxiety Medications. It is not advisable to combine anxiety,sleep and pain medications without talking with your primary care practitioner  Special Instructions: If you have smoked or chewed Tobacco  in the last 2 yrs please stop smoking, stop any regular Alcohol  and or any Recreational drug use.  Wear Seat belts while driving.  Please note: You were cared for by a hospitalist during your hospital stay. Once you are discharged, your primary care physician will handle any further medical issues. Please note that NO REFILLS for any discharge medications will be authorized once you are discharged, as it is imperative that you return to your primary care physician (or establish a relationship with a primary care physician if you do not have one) for your post hospital discharge needs so that they can reassess your need for medications and monitor your lab values.  Total Time spent coordinating discharge including counseling, education and face to face time equals 43minutes.  SignedOren Binet 01/01/2021 10:34 AM

## 2021-01-01 NOTE — Progress Notes (Signed)
SATURATION QUALIFICATIONS: (This note is used to comply with regulatory documentation for home oxygen)  Patient Saturations on Room Air at Rest = 99%  Patient Saturations on Room Air while Ambulating = 80%  Patient Saturations on 3 Liters of oxygen while Ambulating = 90%

## 2021-01-01 NOTE — Discharge Instructions (Signed)

## 2021-01-01 NOTE — Progress Notes (Signed)
Physical Therapy Treatment Patient Details Name: [name] MRN: [id number] DOB: 03-22-1935 Today's Date: 01/01/2021    History of Present Illness 86 y.o. male presents to [facility] ED on 12/27/2020 with progressive weakness, cough, nausea, vomiting, and fever, all since COVID-19 diagnosis 10 days prior. CXR revealed multifocal PNA, blood cultures positive for strep PNA. 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    PT Comments    Patient received in bed, pleasant and cooperative but seems a bit fatigued today, reports he did walk earlier with nursing. Able to mobilize on a supervision basis with S and RW, but did need repeated cues for safe use of device and proximity to device. VSS on 3LPM although Spo2 did drop to 90% on 3LPM and SOB/WOB moderately increased with activity. Limited gait distance due to fatigue today/energy conservation for DC home. Education provided on necessity of supplemental O2 and possible detrimental health outcomes of not using supplemental O2 (desat to 80% on room air per nursing notes).  Left up in recliner with all needs met. Continue to recommend HHPT.    Follow Up Recommendations  Home health PT     Equipment Recommendations  None recommended by PT    Recommendations for Other Services       Precautions / Restrictions Precautions Precautions: Fall Precaution Comments: typically loses his balance backwards Restrictions Weight Bearing Restrictions: No    Mobility  Bed Mobility Overal bed mobility: Needs Assistance Bed Mobility: Supine to Sit     Supine to sit: Supervision;HOB elevated     General bed mobility comments: S and increased time/effort, use of rails    Transfers Overall transfer level: Needs assistance Equipment used: Rolling walker (2 wheeled) Transfers: Sit to/from Stand Sit to Stand: Supervision         General transfer comment: S for safety and cues for hand placement as he tends to pull up from  RW  Ambulation/Gait Ambulation/Gait assistance: Supervision Gait Distance (Feet): 60 Feet Assistive device: Rolling walker (2 wheeled) Gait Pattern/deviations: Trunk flexed;Step-through pattern Gait velocity: reduced   General Gait Details: slow and steady with RW, no signs of LOB but did need cues for safe proximity to device   Stairs             Wheelchair Mobility    Modified Rankin (Stroke Patients Only)       Balance Overall balance assessment: Needs assistance Sitting-balance support: Feet supported;Bilateral upper extremity supported Sitting balance-Leahy Scale: Good     Standing balance support: Bilateral upper extremity supported;During functional activity Standing balance-Leahy Scale: Fair Standing balance comment: reliant on BUE support                            Cognition Arousal/Alertness: Awake/alert Behavior During Therapy: WFL for tasks assessed/performed Overall Cognitive Status: Within Functional Limits for tasks assessed                                        Exercises      General Comments General comments (skin integrity, edema, etc.): 90% on 3LPM with gait in room, SOB and increased WOB      Pertinent Vitals/Pain Pain Assessment: No/denies pain    Home Living  Prior Function            PT Goals (current goals can now be found in the care plan section) Acute Rehab PT Goals Patient Stated Goal: Keep getting better and go back home PT Goal Formulation: With patient Time For Goal Achievement: 01/13/21 Potential to Achieve Goals: Good Progress towards PT goals: Progressing toward goals    Frequency    Min 3X/week      PT Plan Current plan remains appropriate    Co-evaluation              AM-PAC PT "6 Clicks" Mobility   Outcome Measure  Help needed turning from your back to your side while in a flat bed without using bedrails?: None Help needed moving from  lying on your back to sitting on the side of a flat bed without using bedrails?: A Little Help needed moving to and from a bed to a chair (including a wheelchair)?: A Little Help needed standing up from a chair using your arms (e.g., wheelchair or bedside chair)?: A Little Help needed to walk in hospital room?: A Little Help needed climbing 3-5 steps with a railing? : A Little 6 Click Score: 19    End of Session Equipment Utilized During Treatment: Oxygen Activity Tolerance: Patient tolerated treatment well Patient left: in chair;with call bell/phone within reach Nurse Communication: Mobility status PT Visit Diagnosis: Other abnormalities of gait and mobility (R26.89);Muscle weakness (generalized) (M62.81)     Time: 7414-2395 PT Time Calculation (min) (ACUTE ONLY): 13 min  Charges:  $Gait Training: 8-22 mins                    Windell Norfolk, DPT, PN1   Supplemental Physical Therapist [place]    Pager [phone] Acute Rehab Office [phone]

## 2021-01-03 LAB — CULTURE, BLOOD (ROUTINE X 2)
Culture: NO GROWTH
Culture: NO GROWTH
Special Requests: ADEQUATE

## 2021-01-04 ENCOUNTER — Telehealth: Payer: Self-pay

## 2021-01-04 DIAGNOSIS — E1142 Type 2 diabetes mellitus with diabetic polyneuropathy: Secondary | ICD-10-CM | POA: Diagnosis not present

## 2021-01-04 DIAGNOSIS — K449 Diaphragmatic hernia without obstruction or gangrene: Secondary | ICD-10-CM | POA: Diagnosis not present

## 2021-01-04 DIAGNOSIS — E1165 Type 2 diabetes mellitus with hyperglycemia: Secondary | ICD-10-CM | POA: Diagnosis not present

## 2021-01-04 DIAGNOSIS — I2510 Atherosclerotic heart disease of native coronary artery without angina pectoris: Secondary | ICD-10-CM | POA: Diagnosis not present

## 2021-01-04 DIAGNOSIS — J189 Pneumonia, unspecified organism: Secondary | ICD-10-CM | POA: Diagnosis not present

## 2021-01-04 DIAGNOSIS — F32A Depression, unspecified: Secondary | ICD-10-CM | POA: Diagnosis not present

## 2021-01-04 DIAGNOSIS — Z9181 History of falling: Secondary | ICD-10-CM | POA: Diagnosis not present

## 2021-01-04 DIAGNOSIS — J13 Pneumonia due to Streptococcus pneumoniae: Secondary | ICD-10-CM | POA: Diagnosis not present

## 2021-01-04 DIAGNOSIS — J1282 Pneumonia due to coronavirus disease 2019: Secondary | ICD-10-CM | POA: Diagnosis not present

## 2021-01-04 DIAGNOSIS — I5032 Chronic diastolic (congestive) heart failure: Secondary | ICD-10-CM | POA: Diagnosis not present

## 2021-01-04 DIAGNOSIS — J9601 Acute respiratory failure with hypoxia: Secondary | ICD-10-CM | POA: Diagnosis not present

## 2021-01-04 DIAGNOSIS — I480 Paroxysmal atrial fibrillation: Secondary | ICD-10-CM | POA: Diagnosis not present

## 2021-01-04 DIAGNOSIS — K219 Gastro-esophageal reflux disease without esophagitis: Secondary | ICD-10-CM | POA: Diagnosis not present

## 2021-01-04 DIAGNOSIS — I110 Hypertensive heart disease with heart failure: Secondary | ICD-10-CM | POA: Diagnosis not present

## 2021-01-04 DIAGNOSIS — U071 COVID-19: Secondary | ICD-10-CM | POA: Diagnosis not present

## 2021-01-04 DIAGNOSIS — T380X5D Adverse effect of glucocorticoids and synthetic analogues, subsequent encounter: Secondary | ICD-10-CM | POA: Diagnosis not present

## 2021-01-04 DIAGNOSIS — M1990 Unspecified osteoarthritis, unspecified site: Secondary | ICD-10-CM | POA: Diagnosis not present

## 2021-01-04 DIAGNOSIS — I4430 Unspecified atrioventricular block: Secondary | ICD-10-CM | POA: Diagnosis not present

## 2021-01-04 DIAGNOSIS — E785 Hyperlipidemia, unspecified: Secondary | ICD-10-CM | POA: Diagnosis not present

## 2021-01-04 NOTE — Telephone Encounter (Signed)
Transition Care Management Follow-up Telephone Call Date of discharge and from where: 01/01/2021, [name] How have you been since you were released from the hospital? Patient is doing much better.  Any questions or concerns? No  Items Reviewed: Did the pt receive and understand the discharge instructions provided? Yes  Medications obtained and verified? Yes  Other? No  Any new allergies since your discharge? No  Dietary orders reviewed? Yes Do you have support at home? Yes   Home Care and Equipment/Supplies: Were home health services ordered? yes If so, what is the name of the agency? [facility]  Has the agency set up a time to come to the patient's home? yes Were any new equipment or medical supplies ordered?  No What is the name of the medical supply agency? N/A Were you able to get the supplies/equipment? not applicable Do you have any questions related to the use of the equipment or supplies? No  Functional Questionnaire: (I = Independent and D = Dependent) ADLs: I  Bathing/Dressing- I  Meal Prep- I  Eating- I  Maintaining continence- I  Transferring/Ambulation- I  Managing Meds- I  Follow up appointments reviewed:  PCP Hospital f/u appt confirmed? Yes  Scheduled to see Dr. Diona Browner on 01/08/2021 @ 2 pm. [facility] f/u appt confirmed?  Follow up with cardiology  Are transportation arrangements needed? No  If their condition worsens, is the pt aware to call PCP or go to the Emergency Dept.? Yes Was the patient provided with contact information for the PCP's office or ED? Yes Was to pt encouraged to call back with questions or concerns? Yes

## 2021-01-04 NOTE — Telephone Encounter (Signed)
Transition Care Management Unsuccessful Follow-up Telephone Call  Date of discharge and from where:  [facility]/2021[name]  Attempts:  1st Attempt  Reason for unsuccessful TCM follow-up call:  Left voice message

## 2021-01-04 NOTE — Telephone Encounter (Signed)
[name], PT with [facility], requesting verbal orders for PT, once a week for 9 weeks.

## 2021-01-04 NOTE — Telephone Encounter (Signed)
Transition Care Management Unsuccessful Follow-up Telephone Call  Date of discharge and from where:  [facility]/01/2021[name]   Attempts:  2nd Attempt  Reason for unsuccessful TCM follow-up call:  No answer/busy

## 2021-01-04 NOTE — Telephone Encounter (Signed)
Left message for Araceli Bouche giving verbal orders for PT 1x a week for 9 weeks.

## 2021-01-05 DIAGNOSIS — J189 Pneumonia, unspecified organism: Secondary | ICD-10-CM | POA: Diagnosis not present

## 2021-01-05 DIAGNOSIS — U071 COVID-19: Secondary | ICD-10-CM | POA: Diagnosis not present

## 2021-01-08 ENCOUNTER — Encounter: Payer: Self-pay | Admitting: Family Medicine

## 2021-01-08 ENCOUNTER — Ambulatory Visit (INDEPENDENT_AMBULATORY_CARE_PROVIDER_SITE_OTHER): Payer: Medicare HMO | Admitting: Family Medicine

## 2021-01-08 ENCOUNTER — Other Ambulatory Visit: Payer: Self-pay

## 2021-01-08 VITALS — BP 84/44 | HR 76 | Temp 98.1°F | Ht 66.0 in | Wt 160.5 lb

## 2021-01-08 DIAGNOSIS — E1142 Type 2 diabetes mellitus with diabetic polyneuropathy: Secondary | ICD-10-CM

## 2021-01-08 DIAGNOSIS — U071 COVID-19: Secondary | ICD-10-CM

## 2021-01-08 DIAGNOSIS — R197 Diarrhea, unspecified: Secondary | ICD-10-CM | POA: Diagnosis not present

## 2021-01-08 DIAGNOSIS — R7881 Bacteremia: Secondary | ICD-10-CM

## 2021-01-08 DIAGNOSIS — I959 Hypotension, unspecified: Secondary | ICD-10-CM

## 2021-01-08 DIAGNOSIS — B955 Unspecified streptococcus as the cause of diseases classified elsewhere: Secondary | ICD-10-CM

## 2021-01-08 DIAGNOSIS — I480 Paroxysmal atrial fibrillation: Secondary | ICD-10-CM | POA: Diagnosis not present

## 2021-01-08 DIAGNOSIS — J1282 Pneumonia due to coronavirus disease 2019: Secondary | ICD-10-CM

## 2021-01-08 DIAGNOSIS — J189 Pneumonia, unspecified organism: Secondary | ICD-10-CM

## 2021-01-08 NOTE — Patient Instructions (Addendum)
Increase fluids.  Decrease lisinopril to 1/2 tablet daily ( 20 mg daily).  Follow BP at home.. call if running < 90/60.  Check blood sugar daily.Marland Kitchen goal fasting 80-120.  Remain off oxygen.   Please stop at the lab to have labs drawn.

## 2021-01-08 NOTE — Progress Notes (Signed)
Patient ID: [name], male    DOB: January 06, 1935, 86 y.o.   MRN: [id number]  This visit was conducted in person.  BP (!) 84/44   Pulse 76   Temp 98.1 F (36.7 C) (Temporal)   Ht 5\' 6"  (1.676 m)   Wt 160 lb 8 oz (72.8 kg)   SpO2 95%   BMI 25.91 kg/m    CC:  Chief Complaint  Patient presents with   Hospitalization Follow-up    Subjective:   HPI: [name] is a 86 y.o. male presenting on 01/08/2021 for Hospitalization Follow-up  Admitted 6/19  Discharged 6/24   Outpatient Recommendations:  Please obtain CMP/CBC in one week Repeat Chest Xray in 4-6 week Assess whether patient requires home O2 at next visit.   Reviewed notes and copied below for information: diagnosed with COVID-19 on 6/10 (home test)-presented to the ED on 6/16 with worsening cough/shortness of breath-he was found to have superimposed bacterial pneumonia (elevated procalcitonin level)-and was subsequently admitted to the hospitalist service. Severe sepsis due to pneumococcal pneumonia with bacteremia: Sepsis physiology has resolved-treated with IV Rocephin-repeat blood cultures on 6/21 negative so far.  He feels significantly better-and has been asking for discharge-sensitivities/culture results reviewed with infectious disease doctor-Dr. Comer-recommendations are to transition to amoxicillin on discharge.     Acute Hypoxic Resp Failure due to pneumococcal pneumonia +/- COVID pneumonia: Suspect that this is predominantly pneumococcal pneumonia causing hypoxemic respiratory failure-however unable to rule out some component of COVID-19 pneumonia as well.  Hypoxia has improved-he is now on room air at rest-but with ambulation does require around 3 L of oxygen.  Home O2 will be arranged-he will be discharged home on tapering steroids and amoxicillin.  Follow-up with his primary care practitioner-and reassess whether he requires home O2 at next visit.   Elevated D-dimer: Downtrending-likely due to  COVID-19-hypoxia improving-lower extremity Doppler was negative for DVT.  In any event-patient has been placed on Eliquis-for A. fib.   HTN: BP better but continues to fluctuate-continue amlodipine/Imdur and lisinopril.  Follow-up with PCP/cardiology for further optimization.     CAD s/p four-vessel CABG 2016: No anginal symptoms-continue Plavix, statin.  No longer on beta-blocker given prior history of second-degree AV block/junctional bradycardia (noted in 48-hour Holter 2019) per review of outpatient cardiology note.   PAF: Probably provoked by pneumonia-no history of CVA-Echo with stable EF-rate remains controlled-spoke with his primary cardiologist-Dr. Angelena Form on 6/24-chart reviewed-given that his SDH (traumatic) was in 2015-and he has had no further bleeding issues-recommendations were to stop Plavix and transition to Eliquis.  Subsequently this MD had a long discussion with the patient's wife-risk/benefits were discussed-she was okay with anticoagulation as long as it was recommended by Dr. Angelena Form.   HLD: On statin   History of junctional bradycardia/second-degree AV block: Noted in 48-hour Holter-in 2019-continue telemetry monitoring-avoid rate control agents.   DM-2 (A1c 6.7 on 06/15/2020) with uncontrolled hyperglycemia due to steroids: CBGs stable-improved as steroid doses have been tapered down-resume close outpatient monitoring-does not appear to be on any oral diabetic agents-we will defer to PCP.      01/08/21 Today pt reports:   SOB, oxygen requirement:  No SOB.Marland Kitchen back at baseline. No fever. Minimal cough now.  Has been doing PT home health.Marland Kitchen oxygen no longer dropping.. 95% on RA. He is still feeling real tired an weak.   No bleeding on Eliquis now for PAF.  In hospital he was constipated for 5 days, no loose stools for x 1  week. He has BM every time he eats... stool is watery. 3 a day. No abd pain.  Low BP in office on amlodipine, imdur and lisinopril BP Readings from  Last 3 Encounters:  01/08/21 (!) 84/44  01/01/21 135/88  09/04/20 140/60    FBS 241.Marland Kitchen last week.. has not checked it since. Was high on steroids.Marland Kitchen last dose 6/28.     Relevant past medical, surgical, family and social history reviewed and updated as indicated. Interim medical history since our last visit reviewed. Allergies and medications reviewed and updated. Outpatient Medications Prior to Visit  Medication Sig Dispense Refill   acetaminophen (TYLENOL) 500 MG tablet Take 500-1,000 mg by mouth 2 (two) times daily as needed (for pain).      albuterol (VENTOLIN HFA) 108 (90 Base) MCG/ACT inhaler Inhale 2 puffs into the lungs every 6 (six) hours as needed for wheezing or shortness of breath. 18 g 0   amLODipine (NORVASC) 5 MG tablet Take 2 tablets (10 mg total) by mouth daily. 60 tablet 1   apixaban (ELIQUIS) 5 MG TABS tablet Take 1 tablet (5 mg total) by mouth 2 (two) times daily. 60 tablet 0   benzonatate (TESSALON PERLES) 100 MG capsule Take 1 capsule (100 mg total) by mouth 3 (three) times daily as needed for cough. 30 capsule 0   cholecalciferol (VITAMIN D3) 25 MCG (1000 UNIT) tablet Take 1,000 Units by mouth daily.     isosorbide mononitrate (IMDUR) 30 MG 24 hr tablet Take 1 tablet by mouth once daily 90 tablet 2   lisinopril (ZESTRIL) 40 MG tablet Take 1 tablet by mouth once daily 90 tablet 1   Multiple Vitamins-Minerals (ONE-A-DAY MENS HEALTH FORMULA PO) Take 1 tablet by mouth daily.     nitroGLYCERIN (NITROSTAT) 0.4 MG SL tablet Place 1 tablet (0.4 mg total) under the tongue every 5 (five) minutes as needed for chest pain. 10 tablet 0   pantoprazole (PROTONIX) 40 MG tablet Take 1 tablet by mouth once daily 90 tablet 3   rosuvastatin (CRESTOR) 5 MG tablet Take 1 tablet (5 mg total) by mouth daily. 90 tablet 3   vitamin B-12 (CYANOCOBALAMIN) 1000 MCG tablet Take 1,000 mcg by mouth daily.     predniSONE (DELTASONE) 10 MG tablet Take 4 tablets (40 mg total) daily for 1 day, then 3 tabs  (30 mg) daily for 1 day, then 2 tablets (20 mg) daily for 1 days, then 1 tablet (10 mg) daily for 1 day, then stop 10 tablet 0   No facility-administered medications prior to visit.     Per HPI unless specifically indicated in ROS section below Review of Systems  All other systems reviewed and are negative. Objective:  BP (!) 84/44   Pulse 76   Temp 98.1 F (36.7 C) (Temporal)   Ht 5\' 6"  (1.676 m)   Wt 160 lb 8 oz (72.8 kg)   SpO2 95%   BMI 25.91 kg/m   Wt Readings from Last 3 Encounters:  01/08/21 160 lb 8 oz (72.8 kg)  12/27/20 175 lb (79.4 kg)  09/04/20 185 lb (83.9 kg)      Physical Exam Constitutional:      Appearance: He is well-developed.  HENT:     Head: Normocephalic.     Right Ear: Hearing normal.     Left Ear: Hearing normal.     Nose: Nose normal.  Neck:     Thyroid: No thyroid mass or thyromegaly.     Vascular: No carotid bruit.  Trachea: Trachea normal.  Cardiovascular:     Rate and Rhythm: Normal rate and regular rhythm.     Pulses: Normal pulses.     Heart sounds: Heart sounds not distant. No murmur heard.   No friction rub. No gallop.     Comments: No peripheral edema Pulmonary:     Effort: Pulmonary effort is normal. No respiratory distress.     Breath sounds: Normal breath sounds.  Skin:    General: Skin is warm and dry.     Findings: No rash.  Psychiatric:        Speech: Speech normal.        Behavior: Behavior normal.        Thought Content: Thought content normal.      Results for orders placed or performed during the hospital encounter of 12/27/20  Resp Panel by RT-PCR (Flu A&B, Covid) Nasopharyngeal Swab   Specimen: Nasopharyngeal Swab; Nasopharyngeal(NP) swabs in vial transport medium  Result Value Ref Range   SARS Coronavirus 2 by RT PCR POSITIVE (A) NEGATIVE   Influenza A by PCR NEGATIVE NEGATIVE   Influenza B by PCR NEGATIVE NEGATIVE  Blood Culture (routine x 2)   Specimen: BLOOD RIGHT HAND  Result Value Ref Range    Specimen Description BLOOD RIGHT HAND    Special Requests      BOTTLES DRAWN AEROBIC AND ANAEROBIC Blood Culture results may not be optimal due to an inadequate volume of blood received in culture bottles   Culture  Setup Time      GRAM POSITIVE COCCI IN BOTH AEROBIC AND ANAEROBIC BOTTLES CRITICAL RESULT CALLED TO, READ BACK BY AND VERIFIED WITH: PHARMD L CHEN [phone] FCP Performed at [facility], [address], [place], [place] [address]    Culture STREPTOCOCCUS PNEUMONIAE (A)    Report Status 12/30/2020 FINAL    Organism ID, Bacteria STREPTOCOCCUS PNEUMONIAE       Susceptibility   Streptococcus pneumoniae - MIC*    ERYTHROMYCIN <=0.12 SENSITIVE Sensitive     LEVOFLOXACIN 1 SENSITIVE Sensitive     VANCOMYCIN 0.5 SENSITIVE Sensitive     PENICILLIN (meningitis) <=0.06 SENSITIVE Sensitive     PENO - penicillin <=0.06      PENICILLIN (non-meningitis) <=0.06 SENSITIVE Sensitive     PENICILLIN (oral) <=0.06 SENSITIVE Sensitive     CEFTRIAXONE (non-meningitis) <=0.12 SENSITIVE Sensitive     CEFTRIAXONE (meningitis) <=0.12 SENSITIVE Sensitive     * STREPTOCOCCUS PNEUMONIAE  Blood Culture (routine x 2)   Specimen: BLOOD  Result Value Ref Range   Specimen Description BLOOD RIGHT ANTECUBITAL    Special Requests      BOTTLES DRAWN AEROBIC AND ANAEROBIC Blood Culture adequate volume   Culture  Setup Time      GRAM POSITIVE COCCI IN BOTH AEROBIC AND ANAEROBIC BOTTLES CRITICAL VALUE NOTED.  VALUE IS CONSISTENT WITH PREVIOUSLY REPORTED AND CALLED VALUE.    Culture (A)     STREPTOCOCCUS PNEUMONIAE SUSCEPTIBILITIES PERFORMED ON PREVIOUS CULTURE WITHIN THE LAST 5 DAYS. Performed at [facility], [place] [address], [place], [place] [address]    Report Status 12/30/2020 FINAL   Blood Culture ID Panel (Reflexed)  Result Value Ref Range   Enterococcus faecalis NOT DETECTED NOT DETECTED   Enterococcus Faecium NOT DETECTED NOT DETECTED   Listeria monocytogenes NOT DETECTED NOT  DETECTED   Staphylococcus species NOT DETECTED NOT DETECTED   Staphylococcus aureus (BCID) NOT DETECTED NOT DETECTED   Staphylococcus epidermidis NOT DETECTED NOT DETECTED  Staphylococcus lugdunensis NOT DETECTED NOT DETECTED   Streptococcus species DETECTED (A) NOT DETECTED   Streptococcus agalactiae NOT DETECTED NOT DETECTED   Streptococcus pneumoniae DETECTED (A) NOT DETECTED   Streptococcus pyogenes NOT DETECTED NOT DETECTED   A.calcoaceticus-baumannii NOT DETECTED NOT DETECTED   Bacteroides fragilis NOT DETECTED NOT DETECTED   Enterobacterales NOT DETECTED NOT DETECTED   Enterobacter cloacae complex NOT DETECTED NOT DETECTED   Escherichia coli NOT DETECTED NOT DETECTED   Klebsiella aerogenes NOT DETECTED NOT DETECTED   Klebsiella oxytoca NOT DETECTED NOT DETECTED   Klebsiella pneumoniae NOT DETECTED NOT DETECTED   Proteus species NOT DETECTED NOT DETECTED   Salmonella species NOT DETECTED NOT DETECTED   Serratia marcescens NOT DETECTED NOT DETECTED   Haemophilus influenzae NOT DETECTED NOT DETECTED   Neisseria meningitidis NOT DETECTED NOT DETECTED   Pseudomonas aeruginosa NOT DETECTED NOT DETECTED   Stenotrophomonas maltophilia NOT DETECTED NOT DETECTED   Candida albicans NOT DETECTED NOT DETECTED   Candida auris NOT DETECTED NOT DETECTED   Candida glabrata NOT DETECTED NOT DETECTED   Candida krusei NOT DETECTED NOT DETECTED   Candida parapsilosis NOT DETECTED NOT DETECTED   Candida tropicalis NOT DETECTED NOT DETECTED   Cryptococcus neoformans/gattii NOT DETECTED NOT DETECTED  MRSA Next Gen by PCR, Nasal   Specimen: Nasal Mucosa; Nasal Swab  Result Value Ref Range   MRSA by PCR Next Gen NOT DETECTED NOT DETECTED  Culture, blood (routine x 2)   Specimen: BLOOD  Result Value Ref Range   Specimen Description BLOOD LEFT ANTECUBITAL    Special Requests      BOTTLES DRAWN AEROBIC AND ANAEROBIC Blood Culture results may not be optimal due to an inadequate volume of blood  received in culture bottles   Culture      NO GROWTH 5 DAYS Performed at [place] [address], [place], [place] [address]    Report Status 01/03/2021 FINAL   Culture, blood (routine x 2)   Specimen: BLOOD  Result Value Ref Range   Specimen Description BLOOD LEFT ANTECUBITAL    Special Requests      BOTTLES DRAWN AEROBIC ONLY Blood Culture adequate volume   Culture      NO GROWTH 5 DAYS Performed at [facility], [place] [address] [place], [place] [address]    Report Status 01/03/2021 FINAL   Lactic acid, plasma  Result Value Ref Range   Lactic Acid, Venous 4.3 (HH) 0.5 - 1.9 mmol/L  Lactic acid, plasma  Result Value Ref Range   Lactic Acid, Venous 3.4 (HH) 0.5 - 1.9 mmol/L  CBC WITH DIFFERENTIAL  Result Value Ref Range   WBC 4.7 4.0 - 10.5 K/uL   RBC 4.71 4.22 - 5.81 MIL/uL   Hemoglobin 13.8 13.0 - 17.0 g/dL   HCT 41.6 39.0 - 52.0 %   MCV 88.3 80.0 - 100.0 fL   MCH 29.3 26.0 - 34.0 pg   MCHC 33.2 30.0 - 36.0 g/dL   RDW 13.5 11.5 - 15.5 %   Platelets 175 150 - 400 K/uL   nRBC 0.0 0.0 - 0.2 %   Neutrophils Relative % 90 %   Neutro Abs 4.1 1.7 - 7.7 K/uL   Lymphocytes Relative 8 %   Lymphs Abs 0.4 (L) 0.7 - 4.0 K/uL   Monocytes Relative 2 %   Monocytes Absolute 0.1 0.1 - 1.0 K/uL   Eosinophils Relative 0 %   Eosinophils Absolute 0.0 0.0 - 0.5 K/uL   Basophils Relative  0 %   Basophils Absolute 0.0 0.0 - 0.1 K/uL   WBC Morphology INCREASED BANDS (>20% BANDS)    Immature Granulocytes 0 %   Abs Immature Granulocytes 0.01 0.00 - 0.07 K/uL  Comprehensive metabolic panel  Result Value Ref Range   Sodium 134 (L) 135 - 145 mmol/L   Potassium 3.4 (L) 3.5 - 5.1 mmol/L   Chloride 103 98 - 111 mmol/L   CO2 20 (L) 22 - 32 mmol/L   Glucose, Bld 151 (H) 70 - 99 mg/dL   BUN 26 (H) 8 - 23 mg/dL   Creatinine, Ser 1.00 0.61 - 1.24 mg/dL   Calcium 8.7 (L) 8.9 - 10.3 mg/dL   Total Protein 6.3 (L) 6.5 - 8.1 g/dL   Albumin 3.0 (L) 3.5 - 5.0 g/dL   AST 25 15 - 41 U/L    ALT 19 0 - 44 U/L   Alkaline Phosphatase 37 (L) 38 - 126 U/L   Total Bilirubin 1.3 (H) 0.3 - 1.2 mg/dL   GFR, Estimated >60 >60 mL/min   Anion gap 11 5 - 15  D-dimer, quantitative  Result Value Ref Range   D-Dimer, Quant 6.05 (H) 0.00 - 0.50 ug/mL-FEU  Procalcitonin  Result Value Ref Range   Procalcitonin 7.56 ng/mL  Lactate dehydrogenase  Result Value Ref Range   LDH 178 98 - 192 U/L  Ferritin  Result Value Ref Range   Ferritin 339 (H) 24 - 336 ng/mL  Triglycerides  Result Value Ref Range   Triglycerides 111 <150 mg/dL  Fibrinogen  Result Value Ref Range   Fibrinogen >800 (H) 210 - 475 mg/dL  C-reactive protein  Result Value Ref Range   CRP 41.5 (H) <1.0 mg/dL  Procalcitonin - Baseline  Result Value Ref Range   Procalcitonin 6.70 ng/mL  Procalcitonin - Baseline  Result Value Ref Range   Procalcitonin 6.42 ng/mL  Lactic acid, plasma  Result Value Ref Range   Lactic Acid, Venous 3.0 (HH) 0.5 - 1.9 mmol/L  CBC with Differential/Platelet  Result Value Ref Range   WBC 5.4 4.0 - 10.5 K/uL   RBC 4.01 (L) 4.22 - 5.81 MIL/uL   Hemoglobin 11.8 (L) 13.0 - 17.0 g/dL   HCT 34.2 (L) 39.0 - 52.0 %   MCV 85.3 80.0 - 100.0 fL   MCH 29.4 26.0 - 34.0 pg   MCHC 34.5 30.0 - 36.0 g/dL   RDW 13.6 11.5 - 15.5 %   Platelets 207 150 - 400 K/uL   nRBC 0.0 0.0 - 0.2 %   Neutrophils Relative % 90 %   Neutro Abs 4.9 1.7 - 7.7 K/uL   Lymphocytes Relative 6 %   Lymphs Abs 0.4 (L) 0.7 - 4.0 K/uL   Monocytes Relative 3 %   Monocytes Absolute 0.2 0.1 - 1.0 K/uL   Eosinophils Relative 0 %   Eosinophils Absolute 0.0 0.0 - 0.5 K/uL   Basophils Relative 0 %   Basophils Absolute 0.0 0.0 - 0.1 K/uL   WBC Morphology VACUOLATED NEUTROPHILS    Immature Granulocytes 1 %   Abs Immature Granulocytes 0.03 0.00 - 0.07 K/uL  Comprehensive metabolic panel  Result Value Ref Range   Sodium 136 135 - 145 mmol/L   Potassium 3.6 3.5 - 5.1 mmol/L   Chloride 104 98 - 111 mmol/L   CO2 24 22 - 32 mmol/L    Glucose, Bld 196 (H) 70 - 99 mg/dL   BUN 19 8 - 23 mg/dL   Creatinine, Ser 0.83 0.61 -  1.24 mg/dL   Calcium 8.2 (L) 8.9 - 10.3 mg/dL   Total Protein 5.3 (L) 6.5 - 8.1 g/dL   Albumin 2.3 (L) 3.5 - 5.0 g/dL   AST 25 15 - 41 U/L   ALT 18 0 - 44 U/L   Alkaline Phosphatase 34 (L) 38 - 126 U/L   Total Bilirubin 0.9 0.3 - 1.2 mg/dL   GFR, Estimated >60 >60 mL/min   Anion gap 8 5 - 15  Glucose, capillary  Result Value Ref Range   Glucose-Capillary 192 (H) 70 - 99 mg/dL  Lactic acid, plasma  Result Value Ref Range   Lactic Acid, Venous 3.1 (HH) 0.5 - 1.9 mmol/L  Glucose, capillary  Result Value Ref Range   Glucose-Capillary 171 (H) 70 - 99 mg/dL  Lactic acid, plasma  Result Value Ref Range   Lactic Acid, Venous 2.7 (HH) 0.5 - 1.9 mmol/L  Glucose, capillary  Result Value Ref Range   Glucose-Capillary 182 (H) 70 - 99 mg/dL  Lactic acid, plasma  Result Value Ref Range   Lactic Acid, Venous 2.3 (HH) 0.5 - 1.9 mmol/L  Glucose, capillary  Result Value Ref Range   Glucose-Capillary 165 (H) 70 - 99 mg/dL  Glucose, capillary  Result Value Ref Range   Glucose-Capillary 181 (H) 70 - 99 mg/dL  CBC with Differential/Platelet  Result Value Ref Range   WBC 6.2 4.0 - 10.5 K/uL   RBC 4.02 (L) 4.22 - 5.81 MIL/uL   Hemoglobin 11.8 (L) 13.0 - 17.0 g/dL   HCT 34.7 (L) 39.0 - 52.0 %   MCV 86.3 80.0 - 100.0 fL   MCH 29.4 26.0 - 34.0 pg   MCHC 34.0 30.0 - 36.0 g/dL   RDW 13.7 11.5 - 15.5 %   Platelets 204 150 - 400 K/uL   nRBC 0.0 0.0 - 0.2 %   Neutrophils Relative % 86 %   Neutro Abs 5.4 1.7 - 7.7 K/uL   Lymphocytes Relative 7 %   Lymphs Abs 0.4 (L) 0.7 - 4.0 K/uL   Monocytes Relative 6 %   Monocytes Absolute 0.3 0.1 - 1.0 K/uL   Eosinophils Relative 0 %   Eosinophils Absolute 0.0 0.0 - 0.5 K/uL   Basophils Relative 0 %   Basophils Absolute 0.0 0.0 - 0.1 K/uL   Immature Granulocytes 1 %   Abs Immature Granulocytes 0.05 0.00 - 0.07 K/uL  Comprehensive metabolic panel  Result Value Ref  Range   Sodium 134 (L) 135 - 145 mmol/L   Potassium 3.9 3.5 - 5.1 mmol/L   Chloride 101 98 - 111 mmol/L   CO2 23 22 - 32 mmol/L   Glucose, Bld 185 (H) 70 - 99 mg/dL   BUN 21 8 - 23 mg/dL   Creatinine, Ser 0.65 0.61 - 1.24 mg/dL   Calcium 8.5 (L) 8.9 - 10.3 mg/dL   Total Protein 5.3 (L) 6.5 - 8.1 g/dL   Albumin 2.3 (L) 3.5 - 5.0 g/dL   AST 46 (H) 15 - 41 U/L   ALT 38 0 - 44 U/L   Alkaline Phosphatase 41 38 - 126 U/L   Total Bilirubin 0.5 0.3 - 1.2 mg/dL   GFR, Estimated >60 >60 mL/min   Anion gap 10 5 - 15  C-reactive protein  Result Value Ref Range   CRP 18.3 (H) <1.0 mg/dL  D-dimer, quantitative  Result Value Ref Range   D-Dimer, Quant 3.58 (H) 0.00 - 0.50 ug/mL-FEU  Magnesium  Result Value Ref Range  Magnesium 1.9 1.7 - 2.4 mg/dL  Glucose, capillary  Result Value Ref Range   Glucose-Capillary 260 (H) 70 - 99 mg/dL  Glucose, capillary  Result Value Ref Range   Glucose-Capillary 203 (H) 70 - 99 mg/dL  Glucose, capillary  Result Value Ref Range   Glucose-Capillary 281 (H) 70 - 99 mg/dL  Glucose, capillary  Result Value Ref Range   Glucose-Capillary 278 (H) 70 - 99 mg/dL  CBC with Differential/Platelet  Result Value Ref Range   WBC 7.6 4.0 - 10.5 K/uL   RBC 4.01 (L) 4.22 - 5.81 MIL/uL   Hemoglobin 11.7 (L) 13.0 - 17.0 g/dL   HCT 34.4 (L) 39.0 - 52.0 %   MCV 85.8 80.0 - 100.0 fL   MCH 29.2 26.0 - 34.0 pg   MCHC 34.0 30.0 - 36.0 g/dL   RDW 13.5 11.5 - 15.5 %   Platelets 259 150 - 400 K/uL   nRBC 0.0 0.0 - 0.2 %   Neutrophils Relative % 82 %   Neutro Abs 6.5 1.7 - 7.7 K/uL   Lymphocytes Relative 6 %   Lymphs Abs 0.5 (L) 0.7 - 4.0 K/uL   Monocytes Relative 8 %   Monocytes Absolute 0.6 0.1 - 1.0 K/uL   Eosinophils Relative 0 %   Eosinophils Absolute 0.0 0.0 - 0.5 K/uL   Basophils Relative 0 %   Basophils Absolute 0.0 0.0 - 0.1 K/uL   Immature Granulocytes 4 %   Abs Immature Granulocytes 0.35 (H) 0.00 - 0.07 K/uL  Comprehensive metabolic panel  Result Value Ref  Range   Sodium 136 135 - 145 mmol/L   Potassium 3.6 3.5 - 5.1 mmol/L   Chloride 107 98 - 111 mmol/L   CO2 22 22 - 32 mmol/L   Glucose, Bld 267 (H) 70 - 99 mg/dL   BUN 23 8 - 23 mg/dL   Creatinine, Ser 0.66 0.61 - 1.24 mg/dL   Calcium 8.3 (L) 8.9 - 10.3 mg/dL   Total Protein 5.3 (L) 6.5 - 8.1 g/dL   Albumin 2.3 (L) 3.5 - 5.0 g/dL   AST 49 (H) 15 - 41 U/L   ALT 64 (H) 0 - 44 U/L   Alkaline Phosphatase 59 38 - 126 U/L   Total Bilirubin 0.7 0.3 - 1.2 mg/dL   GFR, Estimated >60 >60 mL/min   Anion gap 7 5 - 15  C-reactive protein  Result Value Ref Range   CRP 8.7 (H) <1.0 mg/dL  D-dimer, quantitative  Result Value Ref Range   D-Dimer, Quant 3.26 (H) 0.00 - 0.50 ug/mL-FEU  Magnesium  Result Value Ref Range   Magnesium 1.8 1.7 - 2.4 mg/dL  Glucose, capillary  Result Value Ref Range   Glucose-Capillary 272 (H) 70 - 99 mg/dL  Glucose, capillary  Result Value Ref Range   Glucose-Capillary 198 (H) 70 - 99 mg/dL  Glucose, capillary  Result Value Ref Range   Glucose-Capillary 299 (H) 70 - 99 mg/dL  Glucose, capillary  Result Value Ref Range   Glucose-Capillary 327 (H) 70 - 99 mg/dL  CBC with Differential/Platelet  Result Value Ref Range   WBC 11.4 (H) 4.0 - 10.5 K/uL   RBC 4.33 4.22 - 5.81 MIL/uL   Hemoglobin 12.7 (L) 13.0 - 17.0 g/dL   HCT 37.1 (L) 39.0 - 52.0 %   MCV 85.7 80.0 - 100.0 fL   MCH 29.3 26.0 - 34.0 pg   MCHC 34.2 30.0 - 36.0 g/dL   RDW 14.0 11.5 - 15.5 %  Platelets 307 150 - 400 K/uL   nRBC 0.3 (H) 0.0 - 0.2 %   Neutrophils Relative % 69 %   Neutro Abs 7.9 (H) 1.7 - 7.7 K/uL   Lymphocytes Relative 17 %   Lymphs Abs 1.9 0.7 - 4.0 K/uL   Monocytes Relative 11 %   Monocytes Absolute 1.3 (H) 0.1 - 1.0 K/uL   Eosinophils Relative 0 %   Eosinophils Absolute 0.0 0.0 - 0.5 K/uL   Basophils Relative 0 %   Basophils Absolute 0.0 0.0 - 0.1 K/uL   WBC Morphology See Note    nRBC 1 (H) 0 /100 WBC   Metamyelocytes Relative 3 %   Abs Immature Granulocytes 0.30 (H)  0.00 - 0.07 K/uL   Tear Drop Cells PRESENT   Comprehensive metabolic panel  Result Value Ref Range   Sodium 134 (L) 135 - 145 mmol/L   Potassium 3.5 3.5 - 5.1 mmol/L   Chloride 103 98 - 111 mmol/L   CO2 26 22 - 32 mmol/L   Glucose, Bld 154 (H) 70 - 99 mg/dL   BUN 23 8 - 23 mg/dL   Creatinine, Ser 0.76 0.61 - 1.24 mg/dL   Calcium 8.2 (L) 8.9 - 10.3 mg/dL   Total Protein 5.2 (L) 6.5 - 8.1 g/dL   Albumin 2.5 (L) 3.5 - 5.0 g/dL   AST 24 15 - 41 U/L   ALT 51 (H) 0 - 44 U/L   Alkaline Phosphatase 46 38 - 126 U/L   Total Bilirubin 0.5 0.3 - 1.2 mg/dL   GFR, Estimated >60 >60 mL/min   Anion gap 5 5 - 15  C-reactive protein  Result Value Ref Range   CRP 4.5 (H) <1.0 mg/dL  D-dimer, quantitative  Result Value Ref Range   D-Dimer, Quant 3.49 (H) 0.00 - 0.50 ug/mL-FEU  Magnesium  Result Value Ref Range   Magnesium 1.9 1.7 - 2.4 mg/dL  Glucose, capillary  Result Value Ref Range   Glucose-Capillary 262 (H) 70 - 99 mg/dL  Glucose, capillary  Result Value Ref Range   Glucose-Capillary 167 (H) 70 - 99 mg/dL  Glucose, capillary  Result Value Ref Range   Glucose-Capillary 242 (H) 70 - 99 mg/dL  CBG monitoring, ED  Result Value Ref Range   Glucose-Capillary 154 (H) 70 - 99 mg/dL  CBG monitoring, ED  Result Value Ref Range   Glucose-Capillary 181 (H) 70 - 99 mg/dL  CBG monitoring, ED  Result Value Ref Range   Glucose-Capillary 166 (H) 70 - 99 mg/dL   Comment 1 Notify RN    Comment 2 Document in Chart   ECHOCARDIOGRAM LIMITED  Result Value Ref Range   Weight 2,800 oz   Height 67 in   BP 184/93 mmHg   S' Lateral 2.80 cm    This visit occurred during the SARS-CoV-2 public health emergency.  Safety protocols were in place, including screening questions prior to the visit, additional usage of staff PPE, and extensive cleaning of exam room while observing appropriate contact time as indicated for disinfecting solutions.   COVID 19 screen:  No recent travel or known exposure to  COVID19 The patient denies respiratory symptoms of COVID 19 at this time. The importance of social distancing was discussed today.   Assessment and Plan     Eliezer Lofts, MD

## 2021-01-09 LAB — COMPREHENSIVE METABOLIC PANEL
AG Ratio: 1.3 (calc) (ref 1.0–2.5)
ALT: 18 U/L (ref 9–46)
AST: 11 U/L (ref 10–35)
Albumin: 3.4 g/dL — ABNORMAL LOW (ref 3.6–5.1)
Alkaline phosphatase (APISO): 64 U/L (ref 35–144)
BUN: 21 mg/dL (ref 7–25)
CO2: 24 mmol/L (ref 20–32)
Calcium: 9 mg/dL (ref 8.6–10.3)
Chloride: 103 mmol/L (ref 98–110)
Creat: 1.06 mg/dL (ref 0.70–1.11)
Globulin: 2.6 g/dL (calc) (ref 1.9–3.7)
Glucose, Bld: 112 mg/dL — ABNORMAL HIGH (ref 65–99)
Potassium: 5 mmol/L (ref 3.5–5.3)
Sodium: 137 mmol/L (ref 135–146)
Total Bilirubin: 1.1 mg/dL (ref 0.2–1.2)
Total Protein: 6 g/dL — ABNORMAL LOW (ref 6.1–8.1)

## 2021-01-09 LAB — CBC WITH DIFFERENTIAL/PLATELET
Absolute Monocytes: 1450 cells/uL — ABNORMAL HIGH (ref 200–950)
Basophils Absolute: 44 cells/uL (ref 0–200)
Basophils Relative: 0.3 %
Eosinophils Absolute: 87 cells/uL (ref 15–500)
Eosinophils Relative: 0.6 %
HCT: 40 % (ref 38.5–50.0)
Hemoglobin: 12.9 g/dL — ABNORMAL LOW (ref 13.2–17.1)
Lymphs Abs: 2291 cells/uL (ref 850–3900)
MCH: 28.9 pg (ref 27.0–33.0)
MCHC: 32.3 g/dL (ref 32.0–36.0)
MCV: 89.5 fL (ref 80.0–100.0)
MPV: 9.9 fL (ref 7.5–12.5)
Monocytes Relative: 10 %
Neutro Abs: 10629 cells/uL — ABNORMAL HIGH (ref 1500–7800)
Neutrophils Relative %: 73.3 %
Platelets: 374 10*3/uL (ref 140–400)
RBC: 4.47 10*6/uL (ref 4.20–5.80)
RDW: 13.5 % (ref 11.0–15.0)
Total Lymphocyte: 15.8 %
WBC: 14.5 10*3/uL — ABNORMAL HIGH (ref 3.8–10.8)

## 2021-01-11 NOTE — Progress Notes (Signed)
No critical labs need to be addressed urgently. We will discuss labs in detail at upcoming office visit.   

## 2021-01-12 ENCOUNTER — Telehealth: Payer: Self-pay | Admitting: Family Medicine

## 2021-01-12 NOTE — Telephone Encounter (Signed)
Will need written order to d/c oxygen to fax to [employer].

## 2021-01-12 NOTE — Telephone Encounter (Signed)
Mrs. [name] called in due to her husband was told by Dr. Diona Browner that he didn't need the oxygen tank anymore and when she called by the oxygen company that the dr would have to call. And the number to call [phone] and speak to someone about picking up the oxygen.

## 2021-01-13 ENCOUNTER — Telehealth: Payer: Self-pay | Admitting: Cardiovascular Disease

## 2021-01-13 ENCOUNTER — Telehealth (HOSPITAL_COMMUNITY): Payer: Self-pay | Admitting: Pharmacist

## 2021-01-13 ENCOUNTER — Other Ambulatory Visit: Payer: Medicare HMO

## 2021-01-13 DIAGNOSIS — R197 Diarrhea, unspecified: Secondary | ICD-10-CM | POA: Diagnosis not present

## 2021-01-13 DIAGNOSIS — K529 Noninfective gastroenteritis and colitis, unspecified: Secondary | ICD-10-CM | POA: Diagnosis not present

## 2021-01-13 DIAGNOSIS — R7881 Bacteremia: Secondary | ICD-10-CM | POA: Diagnosis not present

## 2021-01-13 DIAGNOSIS — B955 Unspecified streptococcus as the cause of diseases classified elsewhere: Secondary | ICD-10-CM

## 2021-01-13 NOTE — Telephone Encounter (Signed)
Pharmacy Transitions of Care Follow-up Telephone Call  Date of discharge: 01/01/2021   How have you been since you were released from the hospital? Good, slow getting energy back.   Medication changes made at discharge:  - START: Albuterol, amoxil, Eliquis, benzonatate, prednisone  - STOPPED: Plavix  - CHANGED: amlodipine, clopidogrel, lisinopril, pantoprazole  Medication changes verified by the patient? Yes (Yes/No)    Medication Accessibility:  Is the patient able to afford medications? Yes    Medication Review:  APIXABAN (ELIQUIS)  Apixaban 10 mg BID initiated on 01/01/2021. Will switch to apixaban 5 mg BID after 7 days   - Discussed importance of taking medication around the same time everyday  - Reviewed potential DDIs with patient  - Advised patient of medications to avoid (NSAIDs, ASA)  - Educated that Tylenol (acetaminophen) will be the preferred analgesic to prevent risk of bleeding  - Emphasized importance of monitoring for signs and symptoms of bleeding (abnormal bruising, prolonged bleeding, nose bleeds, bleeding from gums, discolored urine, black tarry stools)  - Advised patient to alert all providers of anticoagulation therapy prior to starting a new medication or having a procedure    Follow-up Appointments:  PCP Hospital f/u appt confirmed? Yes  Specialist Hospital f/u appt confirmed? Yes  If their condition worsens, is the pt aware to call PCP or go to the Emergency Dept.? Yes  Final Patient Assessment: Patient had a follow up with PCP, blood pressure running low, so lisinopril dose was decreased.  Patient continues to monitor BP.  Plans on calling cardiologist to schedule an appointment.  Doing well since home, just tired.  Reviewed medication regimen, discussed possible side effects.

## 2021-01-13 NOTE — Telephone Encounter (Signed)
D/C orders for oxygen faxed to [place] at [phone].

## 2021-01-13 NOTE — Telephone Encounter (Signed)
Spoke with pt's wife, DPR who reports pt was hospitalized 2 weeks ago for Covid pneumonia and sepsis.  Pt has had very low BP since being back home.  Pt was seen by his PCP 01/08/2021 and O2 was d/c'd  She decreased pt's Lisinopril to 20mg  - 1/2 tablet according to office note.  Pt has not had any slurred speech since returning home.  Pt denies current CP, SOB or dizziness but does remain weak.  Pt is drinking adequate fluids. Pt has not checked BP today.  Requested that pt check BP now.  Current BP is 113/63.  Pt's wife advised BP WNL.  Encouraged pt to change positions slowly and continue to monitor BP daily.  Push fluids as pt has not been instructed to restrict fluid consumption.  Take medications as prescribed and keep follow up appointments.  Pt's wife verbalizes understanding and agrees with current plan.

## 2021-01-13 NOTE — Telephone Encounter (Signed)
Pt c/o BP issue: STAT if pt c/o blurred vision, one-sided weakness or slurred speech  1. What are your last 5 BP readings? 84/44; 60; 105/52; 60  2. Are you having any other symptoms (ex. Dizziness, headache, blurred vision, passed out)? Slurred speech, weak, dizziness, balance issues  3. What is your BP issue? Really low BP

## 2021-01-15 LAB — GASTROINTESTINAL PATHOGEN PANEL PCR
C. difficile Tox A/B, PCR: NOT DETECTED
Campylobacter, PCR: NOT DETECTED
Cryptosporidium, PCR: NOT DETECTED
E coli (ETEC) LT/ST PCR: NOT DETECTED
E coli (STEC) stx1/stx2, PCR: NOT DETECTED
E coli 0157, PCR: NOT DETECTED
Giardia lamblia, PCR: NOT DETECTED
Norovirus, PCR: NOT DETECTED
Rotavirus A, PCR: NOT DETECTED
Salmonella, PCR: NOT DETECTED
Shigella, PCR: NOT DETECTED

## 2021-01-27 ENCOUNTER — Other Ambulatory Visit: Payer: Self-pay | Admitting: Family Medicine

## 2021-01-27 ENCOUNTER — Telehealth: Payer: Self-pay | Admitting: Family Medicine

## 2021-01-27 ENCOUNTER — Other Ambulatory Visit (HOSPITAL_COMMUNITY): Payer: Self-pay

## 2021-01-27 NOTE — Telephone Encounter (Signed)
Paperwork is in dr. Rometta Emery office in box to sign.

## 2021-01-27 NOTE — Telephone Encounter (Signed)
[name] called in and wanted to know about the POC for [name] she stated that she has faxed it multiply times and no response

## 2021-01-27 NOTE — Progress Notes (Signed)
Cardiology Office Note    Date:  02/09/2021   ID:  Finnean, Cerami 14-Feb-1935, MRN [id number]   PCP:  Jinny Sanders, MD   [facility]  Cardiologist:  Lauree Chandler, MD   Advanced Practice Provider:  No care team member to display Electrophysiologist:  None   [id number]   Chief Complaint  Patient presents with   Hospitalization Follow-up     History of Present Illness:  [name] is a 86 y.o. male with history of CAD with history of stent to the LAD in 2005, CABG x4 in 2016, no ischemia on NST 01/2018 echo 01/2018 LVEF 60 to 65% with grade 1 DD mild MR, cardiac monitor at that time second-degree AV block and junctional bradycardia with frequent PACs beta-blocker was stopped, hypertension, HLD, GERD.He had trouble speaking in December 2020 and EMS strips showed sinus with PACs.  EKG in ED with sinus.  cardiac monitor 09/2019 shows no evidence of atrial fibrillation.   Patient had covid pneumonia and sepsis in June. PCP decreased lisinopril. Patient also decreased amlodipine 5 mg once daily. Had episode of Afib in the hospital and they spoke to Dr. Angelena Form and he agreed with stopping Plavix and starting Eliquis.  Echo that admission normal LVEF 70 to 75% .  Patient comes in with wife. Overall doing well. No chest pain, getting strength back. Worried about cost of eliquis.   Past Medical History:  Diagnosis Date   CAD (coronary artery disease)    s/p stent mid LAD 2005 // s/p CABG 2016 // Myoview 8/19:  EF 64, no ischemia   Coronary artery disease involving native coronary artery of native heart without angina pectoris    Depression    DJD (degenerative joint disease)    DM type 2 with diabetic peripheral neuropathy ([place]) 03/10/2017   E coli bacteremia 04/20/2016   Essential hypertension 04/20/2016   GERD (gastroesophageal reflux disease)    History of echocardiogram    Echo 8/19: Moderate LVH, EF 60-65, normal wall motion, grade 1 diastolic  dysfunction, mild MR, mild LAE, normal RVSF, mild TR, PASP 25   History of hiatal hernia    HTN (hypertension)    Hyperlipidemia    Neuropathy due to type 2 diabetes mellitus ([place]) 03/10/2017   PERCUTANEOUS TRANSLUMINAL CORONARY ANGIOPLASTY, HX OF 11/26/2007   Annotation: With CYPHER STENT. Qualifier: Diagnosis of  By: Danny Lawless CMA, [place]     Pre-diabetes    S/P CABG x 4 11/03/2014   S/P total knee replacement 04/11/2016   Sepsis due to Escherichia coli (E. coli) ([place]) 04/20/2016   Tinea pedis 09/08/2017    Past Surgical History:  Procedure Laterality Date   BRAIN SURGERY     CARDIAC CATHETERIZATION     CORONARY ARTERY BYPASS GRAFT N/A 11/03/2014   Procedure: CORONARY ARTERY BYPASS GRAFTING (CABG), ON PUMP, TIMES FOUR, USING LEFT INTERNAL MAMMARY, RIGHT GREATER SAPHENOUS VEIN HARVESTED ENDOSCOPICALLY;  Surgeon: Ivin Poot, MD;  Location: [facility];  Service: Open Heart Surgery;  Laterality: N/A;  LIMA-LAD; SVG-DIAG; SVG-OM; SVG-RCA   CRANIOTOMY N/A 01/18/2014   Procedure: CRANIOTOMY HEMATOMA EVACUATION SUBDURAL;  Surgeon: Hosie Spangle, MD;  Location: [facility];  Service: Neurosurgery;  Laterality: N/A;   kidney stone removal     KNEE SURGERY     LEFT HEART CATHETERIZATION WITH CORONARY ANGIOGRAM N/A 07/27/2011   Procedure: LEFT HEART CATHETERIZATION WITH CORONARY ANGIOGRAM;  Surgeon: Burnell Blanks, MD;  Location: [facility] CATH LAB;  Service: Cardiovascular;  Laterality: N/A;   LEFT HEART CATHETERIZATION WITH CORONARY ANGIOGRAM N/A 10/31/2014   Procedure: LEFT HEART CATHETERIZATION WITH CORONARY ANGIOGRAM;  Surgeon: Sherren Mocha, MD;  Location: [facility] CATH LAB;  Service: Cardiovascular;  Laterality: N/A;   PTCA     Hx of it.    SIGMOIDOSCOPY     TEE WITHOUT CARDIOVERSION N/A 11/03/2014   Procedure: TRANSESOPHAGEAL ECHOCARDIOGRAM (TEE);  Surgeon: Ivin Poot, MD;  Location: [facility];  Service: Open Heart Surgery;  Laterality: N/A;   TOTAL KNEE ARTHROPLASTY  02/2011   Right knee   TOTAL KNEE  ARTHROPLASTY Left 04/11/2016   Procedure: LEFT TOTAL KNEE ARTHROPLASTY;  Surgeon: Vickey Huger, MD;  Location: [facility];  Service: Orthopedics;  Laterality: Left;    Current Medications: Current Meds  Medication Sig   acetaminophen (TYLENOL) 500 MG tablet Take 500-1,000 mg by mouth 2 (two) times daily as needed (for pain).    albuterol (VENTOLIN HFA) 108 (90 Base) MCG/ACT inhaler Inhale 2 puffs into the lungs every 6 (six) hours as needed for wheezing or shortness of breath.   amLODipine (NORVASC) 5 MG tablet Take 5 mg by mouth daily.   apixaban (ELIQUIS) 5 MG TABS tablet Take 1 tablet (5 mg total) by mouth 2 (two) times daily.   cholecalciferol (VITAMIN D3) 25 MCG (1000 UNIT) tablet Take 1,000 Units by mouth daily.   isosorbide mononitrate (IMDUR) 30 MG 24 hr tablet Take 1 tablet by mouth once daily   lisinopril (ZESTRIL) 40 MG tablet Take 40 mg by mouth daily. Per patient taking 1/2 tablet   Multiple Vitamins-Minerals (ONE-A-DAY MENS HEALTH FORMULA PO) Take 1 tablet by mouth daily.   pantoprazole (PROTONIX) 40 MG tablet Take 1 tablet by mouth once daily   rosuvastatin (CRESTOR) 5 MG tablet Take 1 tablet by mouth once daily   vitamin B-12 (CYANOCOBALAMIN) 1000 MCG tablet Take 1,000 mcg by mouth daily.   [DISCONTINUED] amLODipine (NORVASC) 5 MG tablet Take 2 tablets (10 mg total) by mouth daily.   [DISCONTINUED] nitroGLYCERIN (NITROSTAT) 0.4 MG SL tablet Place 1 tablet (0.4 mg total) under the tongue every 5 (five) minutes as needed for chest pain.     Allergies:   Protamine, Statins, Adhesive [tape], Rosuvastatin calcium, and Imodium [loperamide]   Social History   Socioeconomic History   Marital status: Married    Spouse name: Not on file   Number of children: Not on file   Years of education: Not on file   Highest education level: Not on file  Occupational History   Not on file  Tobacco Use   Smoking status: Former   Smokeless tobacco: Former   Tobacco comments:    quit in 1985.  smoked 1ppd for 35 years   Vaping Use   Vaping Use: Never used  Substance and Sexual Activity   Alcohol use: No   Drug use: No   Sexual activity: Never  Other Topics Concern   Not on file  Social History Narrative   Married with children. 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 10:20 am    Has living will,  HCPOA: jerrie Runnels, full code (reviewed 48)   Social Determinants of Health   Financial Resource Strain: Low Risk    Difficulty of Paying Living Expenses: Not very hard  Food Insecurity: No Food Insecurity   Worried About Charity fundraiser in the Last Year: Never true   [place] in the Last Year:  Never true  Transportation Needs: No Transportation Needs   Lack of Transportation (Medical): No   Lack of Transportation (Non-Medical): No  Physical Activity: Insufficiently Active   Days of Exercise per Week: 7 days   Minutes of Exercise per Session: 10 min  Stress: No Stress Concern Present   Feeling of Stress : Not at all  Social Connections: Not on file     Family History:  The patient's  family history includes COPD in his mother; Heart attack in his brother, father, and sister; Heart disease in his father and sister; Heart failure in his mother.   ROS:   Please see the history of present illness.    ROS All other systems reviewed and are negative.   PHYSICAL EXAM:   VS:  BP 132/66   Pulse 89   Ht 5\' 7"  (1.702 m)   Wt 171 lb 9.6 oz (77.8 kg)   SpO2 96%   BMI 26.88 kg/m   Physical Exam  GEN: Well nourished, well developed, in no acute distress  Neck: no JVD, carotid bruits, or masses Cardiac:RRR; 2/6 systolic murmur  Respiratory:  decreased breath sounds, clear to auscultation bilaterally, normal work of breathing GI: soft, nontender, nondistended, + BS Ext: without cyanosis, clubbing, or edema, Good distal pulses bilaterally Neuro:  Alert and Oriented x 3, Psych: euthymic mood, full affect  Wt Readings from  Last 3 Encounters:  02/09/21 171 lb 9.6 oz (77.8 kg)  02/05/21 173 lb 4 oz (78.6 kg)  01/08/21 160 lb 8 oz (72.8 kg)      Studies/Labs Reviewed:   EKG:  EKG is not ordered today.     Recent Labs: 06/23/2020: TSH 1.24 01/01/2021: Magnesium 1.9 01/08/2021: ALT 18; BUN 21; Creat 1.06; Potassium 5.0; Sodium 137 02/05/2021: Hemoglobin 11.8; Platelets 197   Lipid Panel    Component Value Date/Time   CHOL 132 06/15/2020 0938   CHOL 122 09/22/2016 0912   TRIG 111 12/27/2020 2155   HDL 42.60 06/15/2020 0938   HDL 39 (L) 09/22/2016 0912   CHOLHDL 3 06/15/2020 0938   VLDL 20.2 06/15/2020 0938   LDLCALC 69 06/15/2020 0938   LDLCALC 63 09/22/2016 0912    Additional studies/ records that were reviewed today include:  2D echo 12/2020 IMPRESSIONS     1. Left ventricular ejection fraction, by estimation, is 70 to 75%. The  left ventricle has hyperdynamic function. The left ventricle has no  regional wall motion abnormalities. There is moderate left ventricular  hypertrophy. Left ventricular diastolic  function could not be evaluated.   2. Right ventricular systolic function is normal. The right ventricular  size is normal. There is normal pulmonary artery systolic pressure.   3. The mitral valve is normal in structure. Trivial mitral valve  regurgitation. No evidence of mitral stenosis.   4. The aortic valve is tricuspid. Aortic valve regurgitation is trivial.  Mild aortic valve sclerosis is present, with no evidence of aortic valve  stenosis.   5. The inferior vena cava is normal in size with greater than 50%  respiratory variability, suggesting right atrial pressure of 3 mmHg.   Holter 09/2019 Study Highlights  Sinus rhythm with sinus bradycardia Second degree AV block (Mobitz 1) No evidence of atrial fibrillation               Risk Assessment/Calculations:    CHA2DS2-VASc Score = 5  This indicates a 7.2% annual risk of stroke. The patient's score is based  upon: CHF History: Yes HTN  History: Yes Diabetes History: No Stroke History: No Vascular Disease History: Yes Age Score: 2 Gender Score: 0       ASSESSMENT:    1. Coronary artery disease involving native coronary artery of native heart without angina pectoris   2. Paroxysmal atrial fibrillation (HCC)   3. Essential hypertension   4. Hyperlipidemia, unspecified hyperlipidemia type   5. History of bradycardia   6. Second degree AV block      PLAN:  In order of problems listed above:  CAD without angina: He is s/p 4V CABG in 2016. Nuclear stress test July 2019 with no ischemia. LV function normal by echo in 12/2020. No chest pain. Continue Crestor, and amlodipine.  no longer on plavix because of Eliquis.   PAF in the hospital in the setting of COVID-pneumonia.  Started on Eliquis and tolerating well.  CBC last week was normal.  Not on rate lowering medications because of history of bradycardia in the past.  Has not had any fast heart rates. Worried about the cost of eliquis. Will reach out to [employer].   HTN: BP low after covid19-on lower dose lisinopril and amlodipine. Continue to monitor   Hyperlipidemia: LDL at goal in December 2021. Continue statin       Bradycardia/Second Degree AV block: No dizziness-stay off beta blockers.       Shared Decision Making/Informed Consent        Medication Adjustments/Labs and Tests Ordered: Current medicines are reviewed at length with the patient today.  Concerns regarding medicines are outlined above.  Medication changes, Labs and Tests ordered today are listed in the Patient Instructions below. Patient Instructions  Medication Instructions: Your physician recommends that you continue on your current medications as directed. Please refer to the Current Medication list given to you today.  *If you need a refill on your cardiac medications before your next appointment, please call your pharmacy*   Lab Work: None ordered   If  you have labs (blood work) drawn today and your tests are completely normal, you will receive your results only by: [place] (if you have MyChart) OR A paper copy in the mail If you have any lab test that is abnormal or we need to change your treatment, we will call you to review the results.   Testing/Procedures: None ordered    Follow-Up: At [facility], you and your health needs are our priority.  As part of our continuing mission to provide you with exceptional heart care, we have created designated Provider Care Teams.  These Care Teams include your primary Cardiologist (physician) and Advanced Practice Providers (APPs -  Physician Assistants and Nurse Practitioners) who all work together to provide you with the care you need, when you need it.  We recommend signing up for the patient portal called "MyChart".  Sign up information is provided on this After Visit Summary.  MyChart is used to connect with patients for Virtual Visits (Telemedicine).  Patients are able to view lab/test results, encounter notes, upcoming appointments, etc.  Non-urgent messages can be sent to your provider as well.   To learn more about what you can do with MyChart, go to [email or web address].    Your next appointment:   6 month(s)  The format for your next appointment:   In Person  Provider:   You may see Lauree Chandler, MD or one of the following Advanced Practice Providers on your designated Care Team:   Melina Copa, PA-C Ermalinda Barrios, PA-C   Other  Instructions None     Signed, Ermalinda Barrios, PA-C  02/09/2021 2:35 PM    [facility] [address], [place], [place]  [address] Phone: [phone]; Fax: [phone]

## 2021-01-28 ENCOUNTER — Telehealth: Payer: Self-pay | Admitting: Family Medicine

## 2021-01-28 MED ORDER — APIXABAN 5 MG PO TABS: 5.0000 mg | ORAL_TABLET | Freq: Two times a day (BID) | ORAL | 2 refills | Status: DC

## 2021-01-28 NOTE — Telephone Encounter (Signed)
Refill sent as requested. 

## 2021-01-28 NOTE — Telephone Encounter (Signed)
  LAST APPOINTMENT DATE:  Please schedule appointment if longer than 1 year  NEXT APPOINTMENT DATE:@7 /29/2022  MEDICATION: apixaban (ELIQUIS) 5 MG TABS tablet  Is the patient out of medication? yes  PHARMACY: [facility] [address]   Let patient know to contact pharmacy at the end of the day to make sure medication is ready.  Please notify patient to allow 48-72 hours to process  Encourage patient to contact the pharmacy for refills or they can request refills through [place]:   LAST REFILL:  QTY:  REFILL DATE:    OTHER COMMENTS:    Okay for refill?  Please advise

## 2021-02-04 ENCOUNTER — Other Ambulatory Visit: Payer: Self-pay

## 2021-02-04 DIAGNOSIS — K219 Gastro-esophageal reflux disease without esophagitis: Secondary | ICD-10-CM | POA: Diagnosis not present

## 2021-02-04 DIAGNOSIS — E1142 Type 2 diabetes mellitus with diabetic polyneuropathy: Secondary | ICD-10-CM | POA: Diagnosis not present

## 2021-02-04 DIAGNOSIS — U071 COVID-19: Secondary | ICD-10-CM | POA: Diagnosis not present

## 2021-02-04 DIAGNOSIS — I2510 Atherosclerotic heart disease of native coronary artery without angina pectoris: Secondary | ICD-10-CM | POA: Diagnosis not present

## 2021-02-04 DIAGNOSIS — K449 Diaphragmatic hernia without obstruction or gangrene: Secondary | ICD-10-CM | POA: Diagnosis not present

## 2021-02-04 DIAGNOSIS — J13 Pneumonia due to Streptococcus pneumoniae: Secondary | ICD-10-CM | POA: Diagnosis not present

## 2021-02-04 DIAGNOSIS — J9601 Acute respiratory failure with hypoxia: Secondary | ICD-10-CM | POA: Diagnosis not present

## 2021-02-04 DIAGNOSIS — E785 Hyperlipidemia, unspecified: Secondary | ICD-10-CM | POA: Diagnosis not present

## 2021-02-04 DIAGNOSIS — M1990 Unspecified osteoarthritis, unspecified site: Secondary | ICD-10-CM | POA: Diagnosis not present

## 2021-02-04 DIAGNOSIS — I5032 Chronic diastolic (congestive) heart failure: Secondary | ICD-10-CM | POA: Diagnosis not present

## 2021-02-04 DIAGNOSIS — E1165 Type 2 diabetes mellitus with hyperglycemia: Secondary | ICD-10-CM | POA: Diagnosis not present

## 2021-02-04 DIAGNOSIS — T380X5D Adverse effect of glucocorticoids and synthetic analogues, subsequent encounter: Secondary | ICD-10-CM | POA: Diagnosis not present

## 2021-02-04 DIAGNOSIS — I480 Paroxysmal atrial fibrillation: Secondary | ICD-10-CM | POA: Diagnosis not present

## 2021-02-04 DIAGNOSIS — I110 Hypertensive heart disease with heart failure: Secondary | ICD-10-CM | POA: Diagnosis not present

## 2021-02-04 DIAGNOSIS — F32A Depression, unspecified: Secondary | ICD-10-CM | POA: Diagnosis not present

## 2021-02-04 DIAGNOSIS — Z9181 History of falling: Secondary | ICD-10-CM | POA: Diagnosis not present

## 2021-02-04 DIAGNOSIS — I4430 Unspecified atrioventricular block: Secondary | ICD-10-CM | POA: Diagnosis not present

## 2021-02-04 DIAGNOSIS — J1282 Pneumonia due to coronavirus disease 2019: Secondary | ICD-10-CM | POA: Diagnosis not present

## 2021-02-05 ENCOUNTER — Ambulatory Visit
Admission: RE | Admit: 2021-02-05 | Discharge: 2021-02-05 | Disposition: A | Discharge status: Home / Self Care | Payer: Medicare HMO | Attending: Family Medicine | Admitting: Family Medicine

## 2021-02-05 ENCOUNTER — Ambulatory Visit (INDEPENDENT_AMBULATORY_CARE_PROVIDER_SITE_OTHER): Payer: Medicare HMO | Admitting: Family Medicine

## 2021-02-05 ENCOUNTER — Encounter: Payer: Self-pay | Admitting: Family Medicine

## 2021-02-05 ENCOUNTER — Ambulatory Visit
Admission: RE | Admit: 2021-02-05 | Discharge: 2021-02-05 | Disposition: A | Discharge status: Home / Self Care | Payer: Medicare HMO | Source: Ambulatory Visit | Attending: Family Medicine | Admitting: Family Medicine

## 2021-02-05 VITALS — BP 126/68 | HR 93 | Temp 98.3°F | Ht 66.0 in | Wt 173.2 lb

## 2021-02-05 DIAGNOSIS — E1159 Type 2 diabetes mellitus with other circulatory complications: Secondary | ICD-10-CM | POA: Diagnosis not present

## 2021-02-05 DIAGNOSIS — I152 Hypertension secondary to endocrine disorders: Secondary | ICD-10-CM

## 2021-02-05 DIAGNOSIS — J189 Pneumonia, unspecified organism: Secondary | ICD-10-CM | POA: Insufficient documentation

## 2021-02-05 DIAGNOSIS — K449 Diaphragmatic hernia without obstruction or gangrene: Secondary | ICD-10-CM | POA: Diagnosis not present

## 2021-02-05 LAB — CBC WITH DIFFERENTIAL/PLATELET
Absolute Monocytes: 818 cells/uL (ref 200–950)
Basophils Absolute: 57 cells/uL (ref 0–200)
Basophils Relative: 0.7 %
Eosinophils Absolute: 162 cells/uL (ref 15–500)
Eosinophils Relative: 2 %
HCT: 36.7 % — ABNORMAL LOW (ref 38.5–50.0)
Hemoglobin: 11.8 g/dL — ABNORMAL LOW (ref 13.2–17.1)
Lymphs Abs: 3127 cells/uL (ref 850–3900)
MCH: 28.7 pg (ref 27.0–33.0)
MCHC: 32.2 g/dL (ref 32.0–36.0)
MCV: 89.3 fL (ref 80.0–100.0)
MPV: 9.8 fL (ref 7.5–12.5)
Monocytes Relative: 10.1 %
Neutro Abs: 3937 cells/uL (ref 1500–7800)
Neutrophils Relative %: 48.6 %
Platelets: 197 10*3/uL (ref 140–400)
RBC: 4.11 10*6/uL — ABNORMAL LOW (ref 4.20–5.80)
RDW: 14.5 % (ref 11.0–15.0)
Total Lymphocyte: 38.6 %
WBC: 8.1 10*3/uL (ref 3.8–10.8)

## 2021-02-05 NOTE — Patient Instructions (Addendum)
Continue 1 tablet of amlodipine and 1/2 tablet of lisinopril daily. Follow BP daily.  Have Chest X-ray done for follow up pneumonia.  Please stop at the lab to have labs drawn.

## 2021-02-05 NOTE — Assessment & Plan Note (Signed)
Due for CAR 4-6 week post PNA for eval for resolution.  Will also eval cbc to make sure wbc back in normal range.

## 2021-02-05 NOTE — Progress Notes (Signed)
Patient ID: [name], male    DOB: Nov 09, 1934, 86 y.o.   MRN: [id number]  This visit was conducted in person.  BP 126/68   Pulse 93   Temp 98.3 F (36.8 C) (Temporal)   Ht '5\' 6"'$  (1.676 m)   Wt 173 lb 4 oz (78.6 kg)   SpO2 96%   BMI 27.96 kg/m    CC: Chief Complaint  Patient presents with   Follow-up    PNA/CXR    Subjective:   HPI: [name] is a 86 y.o. male presenting on 02/05/2021 for Follow-up (PNA/CXR)   Admitted to hospital in 12/27/2020 for Pneumonia from [place] and CAP bacterial pneumonia and streptococcal bacteremia.     He was seen in follow up on 01/08/2021 Lisinopril was decreased to 1/2 tab daily given lower BPs.  Amlodipine 5 mg daily  He reports breathing is back to normal range. No cough, no fever.   He is still feeling weak with lifting.  Fatigue is improving.  He is eating well, thre meals a day. Wt Readings from Last 3 Encounters:  02/05/21 173 lb 4 oz (78.6 kg)  01/08/21 160 lb 8 oz (72.8 kg)  12/27/20 175 lb (79.4 kg)      BP is now in normal range on lower dose of medication. BP Readings from Last 3 Encounters:  02/05/21 126/68  01/08/21 (!) 84/44  01/01/21 135/88     Relevant past medical, surgical, family and social history reviewed and updated as indicated. Interim medical history since our last visit reviewed. Allergies and medications reviewed and updated. Outpatient Medications Prior to Visit  Medication Sig Dispense Refill   acetaminophen (TYLENOL) 500 MG tablet Take 500-1,000 mg by mouth 2 (two) times daily as needed (for pain).      albuterol (VENTOLIN HFA) 108 (90 Base) MCG/ACT inhaler Inhale 2 puffs into the lungs every 6 (six) hours as needed for wheezing or shortness of breath. 18 g 0   amLODipine (NORVASC) 5 MG tablet Take 2 tablets (10 mg total) by mouth daily. (Patient taking differently: Take 1 tablet by mouth daily.) 60 tablet 1   apixaban (ELIQUIS) 5 MG TABS tablet Take 1 tablet (5 mg total) by mouth 2 (two)  times daily. 60 tablet 2   cholecalciferol (VITAMIN D3) 25 MCG (1000 UNIT) tablet Take 1,000 Units by mouth daily.     isosorbide mononitrate (IMDUR) 30 MG 24 hr tablet Take 1 tablet by mouth once daily 90 tablet 2   lisinopril (ZESTRIL) 40 MG tablet Take 1 tablet by mouth once daily (Patient taking differently: 20 mg.) 90 tablet 1   Multiple Vitamins-Minerals (ONE-A-DAY MENS HEALTH FORMULA PO) Take 1 tablet by mouth daily.     nitroGLYCERIN (NITROSTAT) 0.4 MG SL tablet Place 1 tablet (0.4 mg total) under the tongue every 5 (five) minutes as needed for chest pain. 10 tablet 0   pantoprazole (PROTONIX) 40 MG tablet Take 1 tablet by mouth once daily 90 tablet 3   rosuvastatin (CRESTOR) 5 MG tablet Take 1 tablet by mouth once daily 90 tablet 1   vitamin B-12 (CYANOCOBALAMIN) 1000 MCG tablet Take 1,000 mcg by mouth daily.     benzonatate (TESSALON PERLES) 100 MG capsule Take 1 capsule (100 mg total) by mouth 3 (three) times daily as needed for cough. 30 capsule 0   No facility-administered medications prior to visit.     Per HPI unless specifically indicated in ROS section below Review of Systems  Constitutional:  Negative for fatigue and fever.  HENT:  Negative for ear pain.   Eyes:  Negative for pain.  Respiratory:  Negative for cough and shortness of breath.   Cardiovascular:  Negative for chest pain, palpitations and leg swelling.  Gastrointestinal:  Negative for abdominal pain.  Genitourinary:  Negative for dysuria.  Musculoskeletal:  Negative for arthralgias.  Neurological:  Negative for syncope, light-headedness and headaches.  Psychiatric/Behavioral:  Negative for dysphoric mood.   Objective:  BP 126/68   Pulse 93   Temp 98.3 F (36.8 C) (Temporal)   Ht '5\' 6"'$  (1.676 m)   Wt 173 lb 4 oz (78.6 kg)   SpO2 96%   BMI 27.96 kg/m   Wt Readings from Last 3 Encounters:  02/05/21 173 lb 4 oz (78.6 kg)  01/08/21 160 lb 8 oz (72.8 kg)  12/27/20 175 lb (79.4 kg)      Physical  Exam Constitutional:      Appearance: He is well-developed.  HENT:     Head: Normocephalic.     Right Ear: Hearing normal.     Left Ear: Hearing normal.     Nose: Nose normal.  Neck:     Thyroid: No thyroid mass or thyromegaly.     Vascular: No carotid bruit.     Trachea: Trachea normal.  Cardiovascular:     Rate and Rhythm: Normal rate and regular rhythm.     Pulses: Normal pulses.     Heart sounds: Heart sounds not distant. No murmur heard.   No friction rub. No gallop.     Comments: No peripheral edema Pulmonary:     Effort: Pulmonary effort is normal. No respiratory distress.     Breath sounds: Normal breath sounds.  Skin:    General: Skin is warm and dry.     Findings: No rash.  Psychiatric:        Speech: Speech normal.        Behavior: Behavior normal.        Thought Content: Thought content normal.      Results for orders placed or performed in visit on 01/13/21  Gastrointestinal Pathogen Panel PCR  Result Value Ref Range   Campylobacter, PCR NOT DETECTED NOT DETECTED   C. difficile Tox A/B, PCR NOT DETECTED NOT DETECTED   E coli 0157, PCR NOT DETECTED NOT DETECTED   E coli (ETEC) LT/ST PCR NOT DETECTED NOT DETECTED   E coli (STEC) stx1/stx2, PCR NOT DETECTED NOT DETECTED   Salmonella, PCR NOT DETECTED NOT DETECTED   Shigella, PCR NOT DETECTED NOT DETECTED   Norovirus, PCR NOT DETECTED NOT DETECTED   Rotavirus A, PCR NOT DETECTED NOT DETECTED   Giardia lamblia, PCR NOT DETECTED NOT DETECTED   Cryptosporidium, PCR NOT DETECTED NOT DETECTED    This visit occurred during the SARS-CoV-2 public health emergency.  Safety protocols were in place, including screening questions prior to the visit, additional usage of staff PPE, and extensive cleaning of exam room while observing appropriate contact time as indicated for disinfecting solutions.   COVID 19 screen:  No recent travel or known exposure to COVID19 The patient denies respiratory symptoms of COVID 19 at this  time. The importance of social distancing was discussed today.   Assessment and Plan    Problem List Items Addressed This Visit     Hypertension associated with diabetes ([place]) (Chronic)    Improved control with lower dosing given weight loss.   Follow closely as some weight gain occurring.  Lisinopril  20 mg daily  amlodipine 5 mg daily.       Community acquired pneumonia - Primary    Due for CAR 4-6 week post PNA for eval for resolution.  Will also eval cbc to make sure wbc back in normal range.       Relevant Orders   DG Chest 2 View   CBC with Differential/Platelet    Orders Placed This Encounter  Procedures   DG Chest 2 View    Standing Status:   Future    Standing Expiration Date:   02/05/2022    Order Specific Question:   Reason for Exam (SYMPTOM  OR DIAGNOSIS REQUIRED)    Answer:   follow up ONA and CXR changes.    Order Specific Question:   Preferred imaging location?    Answer:   [facility]   CBC with Differential/Platelet     Eliezer Lofts, MD

## 2021-02-05 NOTE — Addendum Note (Signed)
Addended by: Cloyd Stagers on: 02/05/2021 04:17 PM   Modules accepted: Orders

## 2021-02-05 NOTE — Assessment & Plan Note (Signed)
Improved control with lower dosing given weight loss.   Follow closely as some weight gain occurring.  Lisinopril 20 mg daily  amlodipine 5 mg daily.

## 2021-02-09 ENCOUNTER — Other Ambulatory Visit: Payer: Self-pay

## 2021-02-09 ENCOUNTER — Encounter: Payer: Self-pay | Admitting: Physician Assistant

## 2021-02-09 ENCOUNTER — Ambulatory Visit: Payer: Medicare HMO | Admitting: Physician Assistant

## 2021-02-09 VITALS — BP 132/66 | HR 89 | Ht 67.0 in | Wt 171.6 lb

## 2021-02-09 DIAGNOSIS — I480 Paroxysmal atrial fibrillation: Secondary | ICD-10-CM

## 2021-02-09 DIAGNOSIS — E785 Hyperlipidemia, unspecified: Secondary | ICD-10-CM | POA: Diagnosis not present

## 2021-02-09 DIAGNOSIS — I10 Essential (primary) hypertension: Secondary | ICD-10-CM

## 2021-02-09 DIAGNOSIS — Z87898 Personal history of other specified conditions: Secondary | ICD-10-CM

## 2021-02-09 DIAGNOSIS — I2510 Atherosclerotic heart disease of native coronary artery without angina pectoris: Secondary | ICD-10-CM

## 2021-02-09 DIAGNOSIS — I441 Atrioventricular block, second degree: Secondary | ICD-10-CM

## 2021-02-09 MED ORDER — NITROGLYCERIN 0.4 MG SL SUBL
0.4000 mg | SUBLINGUAL_TABLET | SUBLINGUAL | 3 refills | Status: DC | PRN
Start: 2021-02-09 — End: 2022-07-13

## 2021-02-09 NOTE — Patient Instructions (Signed)
Medication Instructions: Your physician recommends that you continue on your current medications as directed. Please refer to the Current Medication list given to you today.  *If you need a refill on your cardiac medications before your next appointment, please call your pharmacy*   Lab Work: None ordered   If you have labs (blood work) drawn today and your tests are completely normal, you will receive your results only by: [place] (if you have MyChart) OR A paper copy in the mail If you have any lab test that is abnormal or we need to change your treatment, we will call you to review the results.   Testing/Procedures: None ordered    Follow-Up: At [facility], you and your health needs are our priority.  As part of our continuing mission to provide you with exceptional heart care, we have created designated Provider Care Teams.  These Care Teams include your primary Cardiologist (physician) and Advanced Practice Providers (APPs -  Physician Assistants and Nurse Practitioners) who all work together to provide you with the care you need, when you need it.  We recommend signing up for the patient portal called "MyChart".  Sign up information is provided on this After Visit Summary.  MyChart is used to connect with patients for Virtual Visits (Telemedicine).  Patients are able to view lab/test results, encounter notes, upcoming appointments, etc.  Non-urgent messages can be sent to your provider as well.   To learn more about what you can do with MyChart, go to [email or web address].    Your next appointment:   6 month(s)  The format for your next appointment:   In Person  Provider:   You may see Lauree Chandler, MD or one of the following Advanced Practice Providers on your designated Care Team:   Melina Copa, PA-C Ermalinda Barrios, PA-C   Other Instructions None

## 2021-02-10 ENCOUNTER — Telehealth: Payer: Self-pay

## 2021-02-10 NOTE — Telephone Encounter (Signed)
**Note De-identified Vashon Arch Obfuscation** -----  **Note De-Identified Siah Steely Obfuscation** Message from Imogene Burn, PA-C sent at 02/09/2021  2:36 PM EDT ----- Jeani Hawking, this patient is worried about the cost of eliquis. Can you look into it for him? Thanks

## 2021-02-10 NOTE — Telephone Encounter (Signed)
**Note De-Identified [name] Obfuscation** The pt states that his cost for Eliquis is $47/30 day supply and that he wants to go back on Plavix.  I did advise him that this is the normal cost for name brand Eliquis per Medicare part D and I explained the difference between Eliquis (anticoagulant) and Plavix (antiplatelet) and he verbalized understanding.  He states that he does not feel that he is eligible to be approved for pt asst for his Eliquis due to his income amount but did take [employer] phone number and states that he will call them to see if he would be eligible for their Eliquis program and if so he will request that they mail him an application to his home and will complete his part of it, obtain required documents per [employer], and will bring it to Dr Camillia Herter office at [facility] on [address] to drop off at the front office andis aware that we will take care of the provider page of his application and will fax all to [employer].  He is aware that the only generic anticoagulant currently on the market is Warfarin and he is not interested in switching at this time.  He does have my name and [facility] phone number to call if he has any questions or needs my assistance completing his application.  He thanked me for calling him to discuss options to decrease the cost of his Eliquis.

## 2021-02-12 DIAGNOSIS — I2510 Atherosclerotic heart disease of native coronary artery without angina pectoris: Secondary | ICD-10-CM | POA: Diagnosis not present

## 2021-02-12 DIAGNOSIS — I5032 Chronic diastolic (congestive) heart failure: Secondary | ICD-10-CM | POA: Diagnosis not present

## 2021-02-12 DIAGNOSIS — K219 Gastro-esophageal reflux disease without esophagitis: Secondary | ICD-10-CM | POA: Diagnosis not present

## 2021-02-12 DIAGNOSIS — F32A Depression, unspecified: Secondary | ICD-10-CM | POA: Diagnosis not present

## 2021-02-12 DIAGNOSIS — K449 Diaphragmatic hernia without obstruction or gangrene: Secondary | ICD-10-CM | POA: Diagnosis not present

## 2021-02-12 DIAGNOSIS — I480 Paroxysmal atrial fibrillation: Secondary | ICD-10-CM | POA: Diagnosis not present

## 2021-02-12 DIAGNOSIS — I4430 Unspecified atrioventricular block: Secondary | ICD-10-CM | POA: Diagnosis not present

## 2021-02-12 DIAGNOSIS — E1142 Type 2 diabetes mellitus with diabetic polyneuropathy: Secondary | ICD-10-CM | POA: Diagnosis not present

## 2021-02-12 DIAGNOSIS — E785 Hyperlipidemia, unspecified: Secondary | ICD-10-CM | POA: Diagnosis not present

## 2021-02-12 DIAGNOSIS — E1165 Type 2 diabetes mellitus with hyperglycemia: Secondary | ICD-10-CM | POA: Diagnosis not present

## 2021-02-12 DIAGNOSIS — U071 COVID-19: Secondary | ICD-10-CM | POA: Diagnosis not present

## 2021-02-12 DIAGNOSIS — J1282 Pneumonia due to coronavirus disease 2019: Secondary | ICD-10-CM | POA: Diagnosis not present

## 2021-02-12 DIAGNOSIS — T380X5D Adverse effect of glucocorticoids and synthetic analogues, subsequent encounter: Secondary | ICD-10-CM | POA: Diagnosis not present

## 2021-02-12 DIAGNOSIS — J9601 Acute respiratory failure with hypoxia: Secondary | ICD-10-CM | POA: Diagnosis not present

## 2021-02-12 DIAGNOSIS — M1990 Unspecified osteoarthritis, unspecified site: Secondary | ICD-10-CM | POA: Diagnosis not present

## 2021-02-12 DIAGNOSIS — Z9181 History of falling: Secondary | ICD-10-CM | POA: Diagnosis not present

## 2021-02-12 DIAGNOSIS — J13 Pneumonia due to Streptococcus pneumoniae: Secondary | ICD-10-CM | POA: Diagnosis not present

## 2021-02-12 DIAGNOSIS — I110 Hypertensive heart disease with heart failure: Secondary | ICD-10-CM | POA: Diagnosis not present

## 2021-02-18 DIAGNOSIS — J1282 Pneumonia due to coronavirus disease 2019: Secondary | ICD-10-CM | POA: Diagnosis not present

## 2021-02-18 DIAGNOSIS — I480 Paroxysmal atrial fibrillation: Secondary | ICD-10-CM | POA: Diagnosis not present

## 2021-02-18 DIAGNOSIS — I110 Hypertensive heart disease with heart failure: Secondary | ICD-10-CM | POA: Diagnosis not present

## 2021-02-18 DIAGNOSIS — K449 Diaphragmatic hernia without obstruction or gangrene: Secondary | ICD-10-CM | POA: Diagnosis not present

## 2021-02-18 DIAGNOSIS — I5032 Chronic diastolic (congestive) heart failure: Secondary | ICD-10-CM | POA: Diagnosis not present

## 2021-02-18 DIAGNOSIS — J13 Pneumonia due to Streptococcus pneumoniae: Secondary | ICD-10-CM | POA: Diagnosis not present

## 2021-02-18 DIAGNOSIS — F32A Depression, unspecified: Secondary | ICD-10-CM | POA: Diagnosis not present

## 2021-02-18 DIAGNOSIS — E785 Hyperlipidemia, unspecified: Secondary | ICD-10-CM | POA: Diagnosis not present

## 2021-02-18 DIAGNOSIS — Z9181 History of falling: Secondary | ICD-10-CM | POA: Diagnosis not present

## 2021-02-18 DIAGNOSIS — E1142 Type 2 diabetes mellitus with diabetic polyneuropathy: Secondary | ICD-10-CM | POA: Diagnosis not present

## 2021-02-18 DIAGNOSIS — M1990 Unspecified osteoarthritis, unspecified site: Secondary | ICD-10-CM | POA: Diagnosis not present

## 2021-02-18 DIAGNOSIS — I2510 Atherosclerotic heart disease of native coronary artery without angina pectoris: Secondary | ICD-10-CM | POA: Diagnosis not present

## 2021-02-18 DIAGNOSIS — I4430 Unspecified atrioventricular block: Secondary | ICD-10-CM | POA: Diagnosis not present

## 2021-02-18 DIAGNOSIS — U071 COVID-19: Secondary | ICD-10-CM | POA: Diagnosis not present

## 2021-02-18 DIAGNOSIS — J9601 Acute respiratory failure with hypoxia: Secondary | ICD-10-CM | POA: Diagnosis not present

## 2021-02-18 DIAGNOSIS — K219 Gastro-esophageal reflux disease without esophagitis: Secondary | ICD-10-CM | POA: Diagnosis not present

## 2021-02-18 DIAGNOSIS — E1165 Type 2 diabetes mellitus with hyperglycemia: Secondary | ICD-10-CM | POA: Diagnosis not present

## 2021-02-18 DIAGNOSIS — T380X5D Adverse effect of glucocorticoids and synthetic analogues, subsequent encounter: Secondary | ICD-10-CM | POA: Diagnosis not present

## 2021-02-25 DIAGNOSIS — U071 COVID-19: Secondary | ICD-10-CM | POA: Diagnosis not present

## 2021-02-25 DIAGNOSIS — M1990 Unspecified osteoarthritis, unspecified site: Secondary | ICD-10-CM | POA: Diagnosis not present

## 2021-02-25 DIAGNOSIS — I4430 Unspecified atrioventricular block: Secondary | ICD-10-CM | POA: Diagnosis not present

## 2021-02-25 DIAGNOSIS — I2510 Atherosclerotic heart disease of native coronary artery without angina pectoris: Secondary | ICD-10-CM | POA: Diagnosis not present

## 2021-02-25 DIAGNOSIS — E785 Hyperlipidemia, unspecified: Secondary | ICD-10-CM | POA: Diagnosis not present

## 2021-02-25 DIAGNOSIS — F32A Depression, unspecified: Secondary | ICD-10-CM | POA: Diagnosis not present

## 2021-02-25 DIAGNOSIS — E1165 Type 2 diabetes mellitus with hyperglycemia: Secondary | ICD-10-CM | POA: Diagnosis not present

## 2021-02-25 DIAGNOSIS — I110 Hypertensive heart disease with heart failure: Secondary | ICD-10-CM | POA: Diagnosis not present

## 2021-02-25 DIAGNOSIS — E1142 Type 2 diabetes mellitus with diabetic polyneuropathy: Secondary | ICD-10-CM | POA: Diagnosis not present

## 2021-02-25 DIAGNOSIS — J13 Pneumonia due to Streptococcus pneumoniae: Secondary | ICD-10-CM | POA: Diagnosis not present

## 2021-02-25 DIAGNOSIS — I480 Paroxysmal atrial fibrillation: Secondary | ICD-10-CM | POA: Diagnosis not present

## 2021-02-25 DIAGNOSIS — T380X5D Adverse effect of glucocorticoids and synthetic analogues, subsequent encounter: Secondary | ICD-10-CM | POA: Diagnosis not present

## 2021-02-25 DIAGNOSIS — I5032 Chronic diastolic (congestive) heart failure: Secondary | ICD-10-CM | POA: Diagnosis not present

## 2021-02-25 DIAGNOSIS — K219 Gastro-esophageal reflux disease without esophagitis: Secondary | ICD-10-CM | POA: Diagnosis not present

## 2021-02-25 DIAGNOSIS — J1282 Pneumonia due to coronavirus disease 2019: Secondary | ICD-10-CM | POA: Diagnosis not present

## 2021-02-25 DIAGNOSIS — Z9181 History of falling: Secondary | ICD-10-CM | POA: Diagnosis not present

## 2021-02-25 DIAGNOSIS — J9601 Acute respiratory failure with hypoxia: Secondary | ICD-10-CM | POA: Diagnosis not present

## 2021-02-25 DIAGNOSIS — K449 Diaphragmatic hernia without obstruction or gangrene: Secondary | ICD-10-CM | POA: Diagnosis not present

## 2021-03-03 DIAGNOSIS — M1990 Unspecified osteoarthritis, unspecified site: Secondary | ICD-10-CM | POA: Diagnosis not present

## 2021-03-03 DIAGNOSIS — J9601 Acute respiratory failure with hypoxia: Secondary | ICD-10-CM | POA: Diagnosis not present

## 2021-03-03 DIAGNOSIS — I110 Hypertensive heart disease with heart failure: Secondary | ICD-10-CM | POA: Diagnosis not present

## 2021-03-03 DIAGNOSIS — I5032 Chronic diastolic (congestive) heart failure: Secondary | ICD-10-CM | POA: Diagnosis not present

## 2021-03-03 DIAGNOSIS — E1165 Type 2 diabetes mellitus with hyperglycemia: Secondary | ICD-10-CM | POA: Diagnosis not present

## 2021-03-03 DIAGNOSIS — E1142 Type 2 diabetes mellitus with diabetic polyneuropathy: Secondary | ICD-10-CM | POA: Diagnosis not present

## 2021-03-03 DIAGNOSIS — K449 Diaphragmatic hernia without obstruction or gangrene: Secondary | ICD-10-CM | POA: Diagnosis not present

## 2021-03-03 DIAGNOSIS — I2510 Atherosclerotic heart disease of native coronary artery without angina pectoris: Secondary | ICD-10-CM | POA: Diagnosis not present

## 2021-03-03 DIAGNOSIS — J13 Pneumonia due to Streptococcus pneumoniae: Secondary | ICD-10-CM | POA: Diagnosis not present

## 2021-03-03 DIAGNOSIS — F32A Depression, unspecified: Secondary | ICD-10-CM | POA: Diagnosis not present

## 2021-03-03 DIAGNOSIS — J1282 Pneumonia due to coronavirus disease 2019: Secondary | ICD-10-CM | POA: Diagnosis not present

## 2021-03-03 DIAGNOSIS — Z9181 History of falling: Secondary | ICD-10-CM | POA: Diagnosis not present

## 2021-03-03 DIAGNOSIS — E785 Hyperlipidemia, unspecified: Secondary | ICD-10-CM | POA: Diagnosis not present

## 2021-03-03 DIAGNOSIS — I4430 Unspecified atrioventricular block: Secondary | ICD-10-CM | POA: Diagnosis not present

## 2021-03-03 DIAGNOSIS — U071 COVID-19: Secondary | ICD-10-CM | POA: Diagnosis not present

## 2021-03-03 DIAGNOSIS — T380X5D Adverse effect of glucocorticoids and synthetic analogues, subsequent encounter: Secondary | ICD-10-CM | POA: Diagnosis not present

## 2021-03-03 DIAGNOSIS — K219 Gastro-esophageal reflux disease without esophagitis: Secondary | ICD-10-CM | POA: Diagnosis not present

## 2021-03-03 DIAGNOSIS — I480 Paroxysmal atrial fibrillation: Secondary | ICD-10-CM | POA: Diagnosis not present

## 2021-03-08 NOTE — Assessment & Plan Note (Signed)
No further GI losses but hypotension suggest continue dehydration.. Increase fluids.

## 2021-03-08 NOTE — Assessment & Plan Note (Signed)
Resolving S/P antibiotics.. now off oxygen.

## 2021-03-08 NOTE — Assessment & Plan Note (Signed)
No bleeding on Eliquis now for PAF.

## 2021-03-08 NOTE — Assessment & Plan Note (Signed)
Increase fluids.  Decrease lisinopril to 1/2 tablet daily ( 20 mg daily).

## 2021-03-08 NOTE — Assessment & Plan Note (Addendum)
Now completed steroids. Given decreased po intake and resolving infection folow closely.

## 2021-03-08 NOTE — Assessment & Plan Note (Signed)
Resolving

## 2021-03-16 DIAGNOSIS — H04123 Dry eye syndrome of bilateral lacrimal glands: Secondary | ICD-10-CM | POA: Diagnosis not present

## 2021-03-16 DIAGNOSIS — H5213 Myopia, bilateral: Secondary | ICD-10-CM | POA: Diagnosis not present

## 2021-03-16 DIAGNOSIS — Z961 Presence of intraocular lens: Secondary | ICD-10-CM | POA: Diagnosis not present

## 2021-03-16 DIAGNOSIS — H524 Presbyopia: Secondary | ICD-10-CM | POA: Diagnosis not present

## 2021-03-16 DIAGNOSIS — H52203 Unspecified astigmatism, bilateral: Secondary | ICD-10-CM | POA: Diagnosis not present

## 2021-04-27 ENCOUNTER — Telehealth: Payer: Self-pay

## 2021-04-27 NOTE — Chronic Care Management (AMB) (Signed)
    Chronic Care Management Pharmacy Assistant   Name: JESSUP OGAS  MRN: [id number] DOB: 1934-10-31   Reason for Encounter: Reminder Call   Conditions to be addressed/monitored: CAD, HTN, HLD, and DMII   Medications: Outpatient Encounter Medications as of 04/27/2021  Medication Sig   acetaminophen (TYLENOL) 500 MG tablet Take 500-1,000 mg by mouth 2 (two) times daily as needed (for pain).    albuterol (VENTOLIN HFA) 108 (90 Base) MCG/ACT inhaler Inhale 2 puffs into the lungs every 6 (six) hours as needed for wheezing or shortness of breath.   amLODipine (NORVASC) 5 MG tablet Take 5 mg by mouth daily.   apixaban (ELIQUIS) 5 MG TABS tablet Take 1 tablet (5 mg total) by mouth 2 (two) times daily.   cholecalciferol (VITAMIN D3) 25 MCG (1000 UNIT) tablet Take 1,000 Units by mouth daily.   isosorbide mononitrate (IMDUR) 30 MG 24 hr tablet Take 1 tablet by mouth once daily   lisinopril (ZESTRIL) 40 MG tablet Take 40 mg by mouth daily. Per patient taking 1/2 tablet   Multiple Vitamins-Minerals (ONE-A-DAY MENS HEALTH FORMULA PO) Take 1 tablet by mouth daily.   nitroGLYCERIN (NITROSTAT) 0.4 MG SL tablet Place 1 tablet (0.4 mg total) under the tongue every 5 (five) minutes as needed for chest pain.   pantoprazole (PROTONIX) 40 MG tablet Take 1 tablet by mouth once daily   rosuvastatin (CRESTOR) 5 MG tablet Take 1 tablet by mouth once daily   vitamin B-12 (CYANOCOBALAMIN) 1000 MCG tablet Take 1,000 mcg by mouth daily.   No facility-administered encounter medications on file as of 04/27/2021.    [name] was contacted to remind him of his upcoming telephone visit with Debbora Dus on 05/04/21 at 10:00am. Patient was reminded to have all medications, supplements and any blood glucose and blood pressure readings available for review at appointment.   Are you having any problems with your medications? No  Do you have any concerns you like to discuss with the pharmacist? No    Star  Rating Drugs: Medication:  Last Fill: Day Supply Rosuvastatin 5mg        04/26/21              90 Lisinopril 40mg             01/19/21               [place], CPP notified  Avel Sensor, [place] Assistant [phone]  Total time spent for month CPA: 10 min.

## 2021-05-04 ENCOUNTER — Other Ambulatory Visit: Payer: Self-pay

## 2021-05-04 ENCOUNTER — Ambulatory Visit (INDEPENDENT_AMBULATORY_CARE_PROVIDER_SITE_OTHER): Payer: Medicare HMO

## 2021-05-04 DIAGNOSIS — E1169 Type 2 diabetes mellitus with other specified complication: Secondary | ICD-10-CM

## 2021-05-04 DIAGNOSIS — I480 Paroxysmal atrial fibrillation: Secondary | ICD-10-CM

## 2021-05-04 DIAGNOSIS — E1159 Type 2 diabetes mellitus with other circulatory complications: Secondary | ICD-10-CM

## 2021-05-04 DIAGNOSIS — I152 Hypertension secondary to endocrine disorders: Secondary | ICD-10-CM

## 2021-05-04 NOTE — Progress Notes (Signed)
Chronic Care Management Pharmacy Note  05/04/2021 Name:  [name] MRN:  [id number] DOB:  May 31, 1935  Summary: DM, HTN, HLD are stable, controlled. He reports switching his Crestor to red yeast rice the past 6 weeks due to muscle aches but is planning to resume statin. Trials off statin have resulted in minimal improvement in muscle pains. He has questions about Eliquis versus Plavix and cost. We discussed patient assistance requirements. He will call me or cardiology office if he wants to proceed with PAP  Recommendations: None  Plan: CCM 12 months or sooner if needed  Subjective: [name] is an 86 y.o. year old male who is a primary patient of Bedsole, Amy E, MD.  The CCM team was consulted for assistance with disease management and care coordination needs.    Engaged with patient by telephone for follow up visit in response to provider referral for pharmacy case management and/or care coordination services.   Consent to Services:  The patient was given information about Chronic Care Management services, agreed to services, and gave verbal consent prior to initiation of services.  Please see initial visit note for detailed documentation.   Patient Care Team: Jinny Sanders, MD as PCP - General (Family Medicine) Burnell Blanks, MD as PCP - Cardiology (Cardiology) Rutherford Guys, MD as Consulting Physician (Ophthalmology) Debbora Dus, [facility] as Pharmacist (Pharmacist)  Recent office visits: 02/05/21 - PCP - Pt presented for CAP.  Recent consult visits:  02/09/21 - Cardiology - Pt presented for CAD. Pt is taking 1/2 tablet lisinopril and amlodipine, BP controlled. Continue current medications.  Hospital visits: None in previous 6 months  Objective:  Lab Results  Component Value Date   CREATININE 1.06 01/08/2021   BUN 21 01/08/2021   GFR 80.09 06/15/2020   GFRNONAA >60 01/01/2021   GFRAA 92 09/24/2018   NA 137 01/08/2021   K 5.0 01/08/2021   CALCIUM 9.0  01/08/2021   CO2 24 01/08/2021   GLUCOSE 112 (H) 01/08/2021    Lab Results  Component Value Date/Time   HGBA1C 6.7 (H) 06/15/2020 09:38 AM   HGBA1C 6.4 (A) 09/17/2019 02:14 PM   HGBA1C 6.7 (H) 03/13/2019 09:02 AM   GFR 80.09 06/15/2020 09:38 AM   GFR 87.44 03/13/2019 09:02 AM   MICROALBUR 18.5 (H) 09/11/2018 11:09 AM    Last diabetic Eye exam: None per chart  Last diabetic Foot exam:  Lab Results  Component Value Date/Time   HMDIABFOOTEX done 06/23/2020 12:00 AM     Lab Results  Component Value Date   CHOL 132 06/15/2020   HDL 42.60 06/15/2020   LDLCALC 69 06/15/2020   TRIG 111 12/27/2020   CHOLHDL 3 06/15/2020    Hepatic Function Latest Ref Rng & Units 01/08/2021 01/01/2021 12/31/2020  Total Protein 6.1 - 8.1 g/dL 6.0(L) 5.2(L) 5.3(L)  Albumin 3.5 - 5.0 g/dL - 2.5(L) 2.3(L)  AST 10 - 35 U/L 11 24 49(H)  ALT 9 - 46 U/L 18 51(H) 64(H)  Alk Phosphatase 38 - 126 U/L - 46 59  Total Bilirubin 0.2 - 1.2 mg/dL 1.1 0.5 0.7  Bilirubin, Direct 0.00 - 0.40 mg/dL - - -    Lab Results  Component Value Date/Time   TSH 1.24 06/23/2020 11:00 AM   TSH 2.07 01/21/2016 02:35 PM    CBC Latest Ref Rng & Units 02/05/2021 01/08/2021 01/01/2021  WBC 3.8 - 10.8 Thousand/uL 8.1 14.5(H) 11.4(H)  Hemoglobin 13.2 - 17.1 g/dL 11.8(L) 12.9(L) 12.7(L)  Hematocrit 38.5 -  50.0 % 36.7(L) 40.0 37.1(L)  Platelets 140 - 400 Thousand/uL 197 374 307    Lab Results  Component Value Date/Time   VD25OH 23.81 (L) 06/23/2020 11:00 AM    Clinical ASCVD: Yes  - CAD The ASCVD Risk score (Arnett DK, et al., 2019) failed to calculate for the following reasons:   The 2019 ASCVD risk score is only valid for ages 106 to 79    Depression screen PHQ 2/9 06/17/2020 03/22/2019 03/07/2018  Decreased Interest 0 0 0  Down, Depressed, Hopeless 0 0 0  PHQ - 2 Score 0 0 0  Altered sleeping 0 - 0  Tired, decreased energy 0 - 0  Change in appetite 0 - 0  Feeling bad or failure about yourself  0 - 0  Trouble concentrating 0  - 0  Moving slowly or fidgety/restless 0 - 0  Suicidal thoughts 0 - 0  PHQ-9 Score 0 - 0  Difficult doing work/chores Not difficult at all - Not difficult at all    Social History   Tobacco Use  Smoking Status Former  Smokeless Tobacco Former  Tobacco Comments   quit in 1985. smoked 1ppd for 35 years    BP Readings from Last 3 Encounters:  02/09/21 132/66  02/05/21 126/68  01/08/21 (!) 84/44   Pulse Readings from Last 3 Encounters:  02/09/21 89  02/05/21 93  01/08/21 76   Wt Readings from Last 3 Encounters:  02/09/21 171 lb 9.6 oz (77.8 kg)  02/05/21 173 lb 4 oz (78.6 kg)  01/08/21 160 lb 8 oz (72.8 kg)   BMI Readings from Last 3 Encounters:  02/09/21 26.88 kg/m  02/05/21 27.96 kg/m  01/08/21 25.91 kg/m    Assessment/Interventions: Review of patient past medical history, allergies, medications, health status, including review of consultants reports, laboratory and other test data, was performed as part of comprehensive evaluation and provision of chronic care management services.   SDOH:  (Social Determinants of Health) assessments and interventions performed: Yes   SDOH Screenings   Alcohol Screen: Low Risk    Last Alcohol Screening Score (AUDIT): 0  Depression (PHQ2-9): Low Risk    PHQ-2 Score: 0  Financial Resource Strain: Low Risk    Difficulty of Paying Living Expenses: Not very hard  Food Insecurity: No Food Insecurity   Worried About Charity fundraiser in the Last Year: Never true   Ran Out of Food in the Last Year: Never true  Housing: Low Risk    Last Housing Risk Score: 0  Physical Activity: Insufficiently Active   Days of Exercise per Week: 7 days   Minutes of Exercise per Session: 10 min  Social Connections: Not on file  Stress: No Stress Concern Present   Feeling of Stress : Not at all  Tobacco Use: Medium Risk   Smoking Tobacco Use: Former   Smokeless Tobacco Use: Former   Passive Exposure: Not on Pensions consultant Needs: No  Transportation Needs   Film/video editor (Medical): No   Lack of Transportation (Non-Medical): No    CCM Care Plan  Allergies  Allergen Reactions   Protamine Rash    Hypotension, Rash, unstable vitals.     Statins Other (See Comments)    Caused soreness (Lipitor, Zocor)   Adhesive [Tape] Hives   Rosuvastatin Calcium Other (See Comments)    Caused soreness (Lipitor, Zocor) 12/28/2020 Pt can tolerate low dose   Imodium [Loperamide] Itching and Rash    Medications Reviewed Today  Reviewed by Debbora Dus, [facility] (Pharmacist) on 05/06/21 at 1441  Med List Status: <None>   Medication Order Taking? Sig Documenting Provider Last Dose Status Informant  acetaminophen (TYLENOL) 500 MG tablet [id number] Yes Take 500-1,000 mg by mouth 2 (two) times daily as needed (for pain).  [provider] Taking Active Multiple Informants  albuterol (VENTOLIN HFA) 108 (90 Base) MCG/ACT inhaler [id number]  Inhale 2 puffs into the lungs every 6 (six) hours as needed for wheezing or shortness of breath. Jonetta Osgood, MD  Active   amLODipine (NORVASC) 5 MG tablet [id number] Yes Take 5 mg by mouth daily. [provider] Taking Active   apixaban (ELIQUIS) 5 MG TABS tablet [id number] Yes Take 1 tablet (5 mg total) by mouth 2 (two) times daily. Jinny Sanders, MD Taking Active   cholecalciferol (VITAMIN D3) 25 MCG (1000 UNIT) tablet [id number] Yes Take 1,000 Units by mouth daily. [provider] Taking Active Multiple Informants  isosorbide mononitrate (IMDUR) 30 MG 24 hr tablet [id number] Yes Take 1 tablet by mouth once daily Burnell Blanks, MD Taking Active Multiple Informants  lisinopril (ZESTRIL) 40 MG tablet [id number] Yes Take 40 mg by mouth daily. Per patient taking 1/2 tablet [provider] Taking Active   Multiple Vitamins-Minerals (ONE-A-DAY MENS HEALTH FORMULA PO) [id number] No Take 1 tablet by mouth daily.  Patient not taking: Reported on 05/04/2021    [provider] Not Taking Active Multiple Informants  nitroGLYCERIN (NITROSTAT) 0.4 MG SL tablet [id number]  Place 1 tablet (0.4 mg total) under the tongue every 5 (five) minutes as needed for chest pain. Imogene Burn, PA-C  Active   pantoprazole (PROTONIX) 40 MG tablet [id number] Yes Take 1 tablet by mouth once daily Bedsole, Amy E, MD Taking Active Multiple Informants  rosuvastatin (CRESTOR) 5 MG tablet [id number] Yes Take 1 tablet by mouth once daily Bedsole, Amy E, MD Taking Active   vitamin B-12 (CYANOCOBALAMIN) 1000 MCG tablet [id number] Yes Take 1,000 mcg by mouth daily. [provider] Taking Active Multiple Informants            Patient Active Problem List   Diagnosis Date Noted   PAF (paroxysmal atrial fibrillation) ([place]) 01/08/2021   Hypotension 01/08/2021   Diarrhea 01/08/2021   Community acquired pneumonia 12/29/2020   Streptococcal bacteremia 12/29/2020   Pneumonia due to COVID-19 virus 12/28/2020   Fatigue 06/23/2020   Left buttock pain 06/23/2020   Right carpal tunnel syndrome 03/16/2018   Hyperlipidemia associated with type 2 diabetes mellitus ([place]) 02/07/2018   DM type 2 with diabetic peripheral neuropathy ([place]) 03/10/2017   Neuropathy due to type 2 diabetes mellitus ([place]) 03/10/2017   Hypertension associated with diabetes ([place]) 04/20/2016   S/P total knee replacement 04/11/2016   Coronary artery disease involving native coronary artery of native heart without angina pectoris    S/P CABG x 4 11/03/2014   PERCUTANEOUS TRANSLUMINAL CORONARY ANGIOPLASTY, HX OF 11/26/2007    Immunization History  Administered Date(s) Administered   Fluad Quad(high Dose 65+) 03/22/2019   PFIZER(Purple Top)SARS-COV-2 Vaccination 09/08/2019, 10/08/2019   Pneumococcal Conjugate-13 01/21/2016   Pneumococcal Polysaccharide-23 06/19/2013    Conditions to be addressed/monitored:  Hypertension, Hyperlipidemia, Diabetes, and Atrial Fibrillation   Care Plan : [place]  Updates made by Debbora Dus, [place] since 05/06/2021 12:00 AM     Problem: CHL AMB "PATIENT-SPECIFIC PROBLEM"      Long-Range Goal: Disease Management   Start Date: 11/03/2020  This Visit's Progress: On track  Priority: High  Note:    Current Barriers:  Questions about Eliquis/cost concerns  Pharmacist Clinical Goal(s):  Patient will achieve adherence to monitoring guidelines and medication adherence to achieve therapeutic efficacy through collaboration with PharmD and provider.   Interventions: 1:1 collaboration with Jinny Sanders, MD regarding development and update of comprehensive plan of care as evidenced by provider attestation and co-signature Inter-disciplinary care team collaboration (see longitudinal plan of care) Comprehensive medication review performed; medication list updated in electronic medical record  Hypertension (BP goal <140/90) -Controlled - Clinic readings within goal -Current treatment: Amlodipine 5 mg - 1 tablet daily  Lisinopril 40 mg - 1/2 (20 mg) tablet daily -Medications previously tried: metoprolol - pt reports stopped due to low heart rate  -Using automatic arm cuff for home checks -Current home readings: none reported -Current dietary habits: none specific, limits sweets -Current exercise habits: gardening, no formal exercise  -Recommended to continue current medication  Coronary Artery Disease/A-FIB (Control symptoms/prevention) -Controlled, followed by cardiology -He reports concern about abnormal feeling in chest since starting Eliquis. He also still has some questions about purpose of Eliquis when Plavix is more affordable. Discussed patient assistance option and income requirement of < 55,000 annually for family of 2. He would like to discuss further Eliquis versus Plavix (differences/necessity) with cardiologist. He will continue his Eliquis at this time. He thinks he will not be eligible for Eliquis PAP, but he will  check. His refills have been timely. Current treatment:  Imdur 30 mg - 1 tablet daily (bedtime) Nitroglycerin 0.4 mg SL as needed (none needed recently) Anticoagulant: Eliquis 5 mg - 1 tablet twice daily  -Medications previously tried: aspirin -Recommend continue current medications  Hyperlipidemia: (LDL goal < 70) -Controlled - LDL 69 - Pt had some soreness so he switched from rosuvastatin to red yeast rice for past 6 weeks - plans to switch back today (did notice some improvement off statin, but not certain as he has arthritis). -Current treatment: Rosuvastatin 5 mg - 1 tablet daily -Medications previously tried: none  -Recommended to continue current medication  Diabetes (A1c goal <7%) -Controlled - A1c 6.7% - No updates/changes 05/06/21  -Current medications: None -Medications previously tried: none  -Current home glucose readings - not checking routinely -Counseled to check feet daily and get yearly eye exams - patient reports annual eye exam completed 2021, foot exam up to date -Recommended to continue current medication  Patient Goals/Self-Care Activities Patient will:  - contact office with any concerns   Follow Up Plan: Telephone follow up appointment with care management team member scheduled for: 12 months       Medication Assistance: None required.  Patient affirms current coverage meets needs.   OTCs:  Tylenol 500 mg - 3 tabs in morning and 2 in night  Vitamin D - 1 every evening Vitamin B12 - 1 every evening No longer taking MVI Denies NSAIDs  Star Rating Drugs: Lisinopril 40 mg           01/19/21  90 DS Rosuvastatin 5 mg      04/26/21  90 DS  Patient's preferred pharmacy is:  [facility] [address], [place] - [place] [address] Phone: [phone] Fax: [facility] [address] [place] [address] Phone: [phone] Fax: [phone]  Uses pill box? Yes - 1  for morning and 1 for bedtime  Pt endorses 95% compliance - wife puts them on table for  him, occasionally forgets and usually takes a few hours later  Care Plan and Follow Up Patient Decision:  Patient agrees to Care Plan and Follow-up.  Debbora Dus, PharmD Clinical Pharmacist [facility] at [facility] [phone]

## 2021-05-05 ENCOUNTER — Telehealth: Payer: Medicare HMO

## 2021-05-06 NOTE — Patient Instructions (Addendum)
Dear [name],  Below is a summary of the goals we discussed during our follow up appointment on May 04, 2021. Please contact me anytime with questions or concerns.   Visit Information   Patient Care Plan: CCM Pharmacy Care Plan     Problem Identified: CHL AMB "PATIENT-SPECIFIC PROBLEM"      Long-Range Goal: Disease Management   Start Date: 11/03/2020  This Visit's Progress: On track  Priority: High  Note:    Current Barriers:  Questions about Eliquis/cost concerns  Pharmacist Clinical Goal(s):  Patient will achieve adherence to monitoring guidelines and medication adherence to achieve therapeutic efficacy through collaboration with PharmD and provider.   Interventions: 1:1 collaboration with Jinny Sanders, MD regarding development and update of comprehensive plan of care as evidenced by provider attestation and co-signature Inter-disciplinary care team collaboration (see longitudinal plan of care) Comprehensive medication review performed; medication list updated in electronic medical record  Hypertension (BP goal <140/90) -Controlled - Clinic readings within goal -Current treatment: Amlodipine 5 mg - 1 tablet daily  Lisinopril 40 mg - 1/2 (20 mg) tablet daily -Medications previously tried: metoprolol - pt reports stopped due to low heart rate  -Using automatic arm cuff for home checks -Current home readings: none reported -Current dietary habits: none specific, limits sweets -Current exercise habits: gardening, no formal exercise  -Recommended to continue current medication  Coronary Artery Disease/A-FIB (Control symptoms/prevention) -Controlled, followed by cardiology -He reports concern about abnormal feeling in chest since starting Eliquis. He also still has some questions about purpose of Eliquis when Plavix is more affordable. Discussed patient assistance option and income requirement of < 55,000 annually for family of 2. He would like to discuss further  Eliquis versus Plavix (differences/necessity) with cardiologist. He will continue his Eliquis at this time. He thinks he will not be eligible for Eliquis PAP, but he will check. His refills have been timely. Current treatment:  Imdur 30 mg - 1 tablet daily (bedtime) Nitroglycerin 0.4 mg SL as needed (none needed recently) Anticoagulant: Eliquis 5 mg - 1 tablet twice daily  -Medications previously tried: aspirin -Recommend continue current medications  Hyperlipidemia: (LDL goal < 70) -Controlled - LDL 69 - Pt had some soreness so he switched from rosuvastatin to red yeast rice for past 6 weeks - plans to switch back today (did notice some improvement off statin, but not certain as he has arthritis). -Current treatment: Rosuvastatin 5 mg - 1 tablet daily -Medications previously tried: none  -Recommended to continue current medication  Diabetes (A1c goal <7%) -Controlled - A1c 6.7% - No updates/changes 05/06/21  -Current medications: None -Medications previously tried: none  -Current home glucose readings - not checking routinely -Counseled to check feet daily and get yearly eye exams - patient reports annual eye exam completed 2021, foot exam up to date -Recommended to continue current medication  Patient Goals/Self-Care Activities Patient will:  - contact office with any concerns   Follow Up Plan: Telephone follow up appointment with care management team member scheduled for: 12 months       Patient verbalizes understanding of instructions provided today and agrees to view in [place].   Debbora Dus, PharmD Clinical Pharmacist [place] [facility] at [facility] [phone]

## 2021-05-10 DIAGNOSIS — I480 Paroxysmal atrial fibrillation: Secondary | ICD-10-CM | POA: Diagnosis not present

## 2021-05-10 DIAGNOSIS — I152 Hypertension secondary to endocrine disorders: Secondary | ICD-10-CM | POA: Diagnosis not present

## 2021-05-10 DIAGNOSIS — E785 Hyperlipidemia, unspecified: Secondary | ICD-10-CM | POA: Diagnosis not present

## 2021-05-10 DIAGNOSIS — E1169 Type 2 diabetes mellitus with other specified complication: Secondary | ICD-10-CM

## 2021-05-10 DIAGNOSIS — E1159 Type 2 diabetes mellitus with other circulatory complications: Secondary | ICD-10-CM | POA: Diagnosis not present

## 2021-05-12 ENCOUNTER — Other Ambulatory Visit: Payer: Self-pay | Admitting: Family Medicine

## 2021-05-12 NOTE — Telephone Encounter (Signed)
Please schedule CPE with Dr. Diona Browner for sometime after 06/22/2021.  He already has his [place] with health educator scheduled.

## 2021-05-12 NOTE — Telephone Encounter (Signed)
Called [name] and got him and [name] for 07/13/20 so they can be together.

## 2021-06-21 NOTE — Progress Notes (Signed)
Subjective:   [name] is a 86 y.o. male who presents for Medicare Annual/Subsequent preventive examination.  I connected with [name] today by telephone and verified that I am speaking with the correct person using two identifiers. Location patient: home Location provider: work Persons participating in the virtual visit: patient, Marine scientist.    I discussed the limitations, risks, security and privacy concerns of performing an evaluation and management service by telephone and the availability of in person appointments. I also discussed with the patient that there may be a patient responsible charge related to this service. The patient expressed understanding and verbally consented to this telephonic visit.    Interactive audio and video telecommunications were attempted between this provider and patient, however failed, due to patient having technical difficulties OR patient did not have access to video capability.  We continued and completed visit with audio only.  Some vital signs may be absent or patient reported.   Time Spent with patient on telephone encounter: 25 minutes  Review of Systems     Cardiac Risk Factors include: advanced age (>10men, >69 women);hypertension;diabetes mellitus;dyslipidemia     Objective:    Today's Vitals   06/22/21 1317  Weight: 171 lb (77.6 kg)  Height: 5\' 7"  (1.702 m)   Body mass index is 26.78 kg/m.  Advanced Directives 06/22/2021 12/28/2020 12/27/2020 06/17/2020 03/07/2018 03/03/2017 04/20/2016  Does Patient Have a Medical Advance Directive? Yes No No Yes Yes Yes Yes  Type of Paramedic of [place];Living will - - [place];Living will [place];Living will Living will Living will;Healthcare Power of Attorney  Does patient want to make changes to medical advance directive? Yes (MAU/Ambulatory/Procedural Areas - Information given) - - - - - No - Patient declined  Copy of [place] in Chart? - - - No - copy requested No - copy requested - No - copy requested  Would patient like information on creating a medical advance directive? - No - Patient declined - - - - -  Pre-existing out of facility DNR order (yellow form or pink MOST form) - - - - - - -    Current Medications (verified) Outpatient Encounter Medications as of 06/22/2021  Medication Sig   acetaminophen (TYLENOL) 500 MG tablet Take 500-1,000 mg by mouth 2 (two) times daily as needed (for pain).    albuterol (VENTOLIN HFA) 108 (90 Base) MCG/ACT inhaler Inhale 2 puffs into the lungs every 6 (six) hours as needed for wheezing or shortness of breath.   amLODipine (NORVASC) 5 MG tablet Take 5 mg by mouth daily.   apixaban (ELIQUIS) 5 MG TABS tablet Take 1 tablet by mouth twice daily   cholecalciferol (VITAMIN D3) 25 MCG (1000 UNIT) tablet Take 1,000 Units by mouth daily.   isosorbide mononitrate (IMDUR) 30 MG 24 hr tablet Take 1 tablet by mouth once daily   lisinopril (ZESTRIL) 40 MG tablet Take 40 mg by mouth daily. Per patient taking 1/2 tablet   nitroGLYCERIN (NITROSTAT) 0.4 MG SL tablet Place 1 tablet (0.4 mg total) under the tongue every 5 (five) minutes as needed for chest pain.   pantoprazole (PROTONIX) 40 MG tablet Take 1 tablet by mouth once daily   rosuvastatin (CRESTOR) 5 MG tablet Take 1 tablet by mouth once daily   vitamin B-12 (CYANOCOBALAMIN) 1000 MCG tablet Take 1,000 mcg by mouth daily.   Multiple Vitamins-Minerals (ONE-A-DAY [facility] FORMULA PO) Take 1 tablet by mouth daily. (Patient  not taking: Reported on 05/04/2021)   No facility-administered encounter medications on file as of 06/22/2021.    Allergies (verified) Protamine, Statins, Adhesive [tape], Rosuvastatin calcium, and Imodium [loperamide]   History: Past Medical History:  Diagnosis Date   CAD (coronary artery disease)    s/p stent mid LAD 2005 // s/p CABG 2016 // Myoview 8/19:  EF 64, no ischemia   Coronary  artery disease involving native coronary artery of native heart without angina pectoris    Depression    DJD (degenerative joint disease)    DM type 2 with diabetic peripheral neuropathy ([place]) 03/10/2017   E coli bacteremia 04/20/2016   Essential hypertension 04/20/2016   GERD (gastroesophageal reflux disease)    History of echocardiogram    Echo 8/19: Moderate LVH, EF 60-65, normal wall motion, grade 1 diastolic dysfunction, mild MR, mild LAE, normal RVSF, mild TR, PASP 25   History of hiatal hernia    HTN (hypertension)    Hyperlipidemia    Neuropathy due to type 2 diabetes mellitus ([place]) 03/10/2017   PERCUTANEOUS TRANSLUMINAL CORONARY ANGIOPLASTY, HX OF 11/26/2007   Annotation: With CYPHER STENT. Qualifier: Diagnosis of  By: Danny Lawless CMA, [place]     Pre-diabetes    S/P CABG x 4 11/03/2014   S/P total knee replacement 04/11/2016   Sepsis due to Escherichia coli (E. coli) ([place]) 04/20/2016   Tinea pedis 09/08/2017   Past Surgical History:  Procedure Laterality Date   BRAIN SURGERY     CARDIAC CATHETERIZATION     CORONARY ARTERY BYPASS GRAFT N/A 11/03/2014   Procedure: CORONARY ARTERY BYPASS GRAFTING (CABG), ON PUMP, TIMES FOUR, USING LEFT INTERNAL MAMMARY, RIGHT GREATER SAPHENOUS VEIN HARVESTED ENDOSCOPICALLY;  Surgeon: Ivin Poot, MD;  Location: [place];  Service: Open Heart Surgery;  Laterality: N/A;  LIMA-LAD; SVG-DIAG; SVG-OM; SVG-RCA   CRANIOTOMY N/A 01/18/2014   Procedure: CRANIOTOMY HEMATOMA EVACUATION SUBDURAL;  Surgeon: Hosie Spangle, MD;  Location: [place];  Service: Neurosurgery;  Laterality: N/A;   kidney stone removal     KNEE SURGERY     LEFT HEART CATHETERIZATION WITH CORONARY ANGIOGRAM N/A 07/27/2011   Procedure: LEFT HEART CATHETERIZATION WITH CORONARY ANGIOGRAM;  Surgeon: Burnell Blanks, MD;  Location[facility] CATH LAB;  Service: Cardiovascular;  Laterality: N/A;   LEFT HEART CATHETERIZATION WITH CORONARY ANGIOGRAM N/A 10/31/2014   Procedure: LEFT HEART CATHETERIZATION  WITH CORONARY ANGIOGRAM;  Surgeon: Sherren Mocha, MD;  Location: [facility] CATH LAB;  Service: Cardiovascular;  Laterality: N/A;   PTCA     Hx of it.    SIGMOIDOSCOPY     TEE WITHOUT CARDIOVERSION N/A 11/03/2014   Procedure: TRANSESOPHAGEAL ECHOCARDIOGRAM (TEE);  Surgeon: Ivin Poot, MD;  Location: [place];  Service: Open Heart Surgery;  Laterality: N/A;   TOTAL KNEE ARTHROPLASTY  02/2011   Right knee   TOTAL KNEE ARTHROPLASTY Left 04/11/2016   Procedure: LEFT TOTAL KNEE ARTHROPLASTY;  Surgeon: Vickey Huger, MD;  Location: [place];  Service: Orthopedics;  Laterality: Left;   Family History  Problem Relation Age of Onset   Heart disease Father    Heart attack Father    Heart failure Mother    COPD Mother    Heart disease Sister    Heart attack Sister    Heart attack Brother    Social History   Socioeconomic History   Marital status: Married    Spouse name: Not on file   Number of children: Not on file   Years of education: Not on  file   Highest education level: Not on file  Occupational History   Not on file  Tobacco Use   Smoking status: Former   Smokeless tobacco: Former   Tobacco comments:    quit in 1985. smoked 1ppd for 35 years   Vaping Use   Vaping Use: Never used  Substance and Sexual Activity   Alcohol use: No   Drug use: No   Sexual activity: Never  Other Topics Concern   Not on file  Social History Narrative   Married with children. 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 10:20 am    Has living will,  HCPOA: jerrie Anwar, full code (reviewed 27)   Social Determinants of Health   Financial Resource Strain: Low Risk    Difficulty of Paying Living Expenses: Not hard at all  Food Insecurity: No Food Insecurity   Worried About Charity fundraiser in the Last Year: Never true   Arboriculturist in the Last Year: Never true  Transportation Needs: No Transportation Needs   Lack of Transportation (Medical): No   Lack of  Transportation (Non-Medical): No  Physical Activity: Inactive   Days of Exercise per Week: 0 days   Minutes of Exercise per Session: 0 min  Stress: No Stress Concern Present   Feeling of Stress : Not at all  Social Connections: Socially Integrated   Frequency of Communication with Friends and Family: More than three times a week   Frequency of Social Gatherings with Friends and Family: Three times a week   Attends Religious Services: More than 4 times per year   Active Member of Clubs or Organizations: Yes   Attends Archivist Meetings: More than 4 times per year   Marital Status: Married    Tobacco Counseling Counseling given: Not Answered Tobacco comments: quit in 1985. smoked 1ppd for 35 years    Clinical Intake:  Pre-visit preparation completed: Yes  Pain : No/denies pain     BMI - recorded: 26.87 Nutritional Status: BMI 25 -29 Overweight Nutritional Risks: None Diabetes: Yes CBG done?: No (visit completed over the phone) Did pt. bring in CBG monitor from home?: No  How often do you need to have someone help you when you read instructions, pamphlets, or other written materials from your doctor or pharmacy?: 1 - Never  Diabetes:  Is the patient diabetic?  Yes  If diabetic, was a CBG obtained today?  No , visit completed over the phone.  Did the patient bring in their glucometer from home?  No , visit completed over the phone.  How often do you monitor your CBG's? 2 times per day.   Financial Strains and Diabetes Management:  Are you having any financial strains with the device, your supplies or your medication? No .  Does the patient want to be seen by Chronic Care Management for management of their diabetes?  No  Would the patient like to be referred to a Nutritionist or for Diabetic Management?  No   Diabetic Exams:  Diabetic Eye Exam: Completed 03/2021.   Diabetic Foot Exam: Due, patient has an upcoming appointment with PCP.  Interpreter Needed?:  No  Information entered by :: Orrin Brigham LPN   Activities of Daily Living In your present state of health, do you have any difficulty performing the following activities: 06/22/2021 12/28/2020  Hearing? Tempie Donning  Vision? N N  Difficulty concentrating or making decisions? N N  Walking or climbing  stairs? Y Y  Comment as long as hand rails are available, uses cane -  Dressing or bathing? N N  Doing errands, shopping? N N  Preparing Food and eating ? N -  Using the Toilet? N -  In the past six months, have you accidently leaked urine? N -  Do you have problems with loss of bowel control? N -  Managing your Medications? N -  Managing your Finances? N -  Housekeeping or managing your Housekeeping? N -  Some recent data might be hidden    Patient Care Team: Jinny Sanders, MD as PCP - General (Family Medicine) Burnell Blanks, MD as PCP - Cardiology (Cardiology) Rutherford Guys, MD as Consulting Physician (Ophthalmology) Debbora Dus, [facility] as Pharmacist (Pharmacist)  Indicate any recent Medical Services you may have received from other than [facility] providers in the past year (date may be approximate).     Assessment:   This is a routine wellness examination for [place].  Hearing/Vision screen Hearing Screening - Comments:: Wears hearing aids Vision Screening - Comments:: Last exam 03/2021, Dr. Gershon Crane , Wears readers  Dietary issues and exercise activities discussed: Current Exercise Habits: The patient does not participate in regular exercise at present   Goals Addressed             This Visit's Progress    Patient Stated       Would like to maintain current routine       Depression Screen PHQ 2/9 Scores 06/22/2021 06/17/2020 03/22/2019 03/07/2018 03/03/2017 01/21/2016 12/06/2013  PHQ - 2 Score 0 0 0 0 0 0 0  PHQ- 9 Score - 0 - 0 1 - -    Fall Risk Fall Risk  06/22/2021 06/23/2020 06/17/2020 03/22/2019 03/07/2018  Falls in the past year? 0 0 0 0 No  Number falls in  past yr: 0 - 0 - -  Injury with Fall? 0 - 0 - -  Risk Factor Category  - - - - -  Risk for fall due to : No Fall Risks - Medication side effect - -  Follow up Falls prevention discussed Falls evaluation completed Falls evaluation completed;Falls prevention discussed - -    FALL RISK PREVENTION PERTAINING TO THE HOME:  Any stairs in or around the home? Yes  If so, are there any without handrails? No  Home free of loose throw rugs in walkways, pet beds, electrical cords, etc? No  Adequate lighting in your home to reduce risk of falls? Yes   ASSISTIVE DEVICES UTILIZED TO PREVENT FALLS:  Life alert? No  Use of a cane, walker or w/c? Yes , cane  Grab bars in the bathroom? Yes  Shower chair or bench in shower? No  Elevated toilet seat or a handicapped toilet? Yes   TIMED UP AND GO:  Was the test performed? No , visit completed over the phone.   Cognitive Function: Normal cognitive status assessed by this Nurse Health Advisor. No abnormalities found.   MMSE - Mini Mental State Exam 06/17/2020 03/07/2018 03/03/2017 01/21/2016  Orientation to time 5 5 5 5   Orientation to Place 5 5 5 5   Registration 3 3 3 3   Attention/ Calculation 5 0 0 0  Recall 3 3 3 3   Language- name 2 objects - 0 0 0  Language- repeat - 1 1 1   Language- follow 3 step command - 3 3 3   Language- read & follow direction - 0 0 0  Write a sentence - 0  0 0  Copy design - 0 0 0  Total score - 20 20 20         Immunizations Immunization History  Administered Date(s) Administered   Fluad Quad(high Dose 65+) 03/22/2019   PFIZER(Purple Top)SARS-COV-2 Vaccination 09/08/2019, 10/08/2019   Pneumococcal Conjugate-13 01/21/2016   Pneumococcal Polysaccharide-23 06/19/2013    TDAP status: Due, Education has been provided regarding the importance of this vaccine. Advised may receive this vaccine at local pharmacy or Health Dept. Aware to provide a copy of the vaccination record if obtained from local pharmacy or Health  Dept. Verbalized acceptance and understanding.  Flu Vaccine status: Declined, Education has been provided regarding the importance of this vaccine but patient still declined. Advised may receive this vaccine at local pharmacy or Health Dept. Aware to provide a copy of the vaccination record if obtained from local pharmacy or Health Dept. Verbalized acceptance and understanding.  Pneumococcal vaccine status: Up to date  Covid-19 vaccine status: Information provided on how to obtain vaccines.   Qualifies for Shingles Vaccine? Yes   Zostavax completed No   Shingrix Completed?: No.    Education has been provided regarding the importance of this vaccine. Patient has been advised to call insurance company to determine out of pocket expense if they have not yet received this vaccine. Advised may also receive vaccine at local pharmacy or Health Dept. Verbalized acceptance and understanding.  Screening Tests Health Maintenance  Topic Date Due   Zoster Vaccines- Shingrix (1 of 2) Never done   COVID-19 Vaccine (3 - Booster for Pfizer series) 12/03/2019   HEMOGLOBIN A1C  12/14/2020   INFLUENZA VACCINE  02/08/2021   OPHTHALMOLOGY EXAM  03/11/2021   TETANUS/TDAP  03/04/2027 (Originally 12/25/1953)   FOOT EXAM  06/23/2021   Pneumonia Vaccine 54+ Years old  Completed   HPV VACCINES  Aged Out    Health Maintenance  Health Maintenance Due  Topic Date Due   Zoster Vaccines- Shingrix (1 of 2) Never done   COVID-19 Vaccine (3 - Booster for Pfizer series) 12/03/2019   HEMOGLOBIN A1C  12/14/2020   INFLUENZA VACCINE  02/08/2021   OPHTHALMOLOGY EXAM  03/11/2021    Colorectal cancer screening: No longer required.   Lung Cancer Screening: (Low Dose CT Chest recommended if Age 39-80 years, 30 pack-year currently smoking OR have quit w/in 15years.) does not qualify.     Additional Screening:  Hepatitis C Screening: does not qualify  Vision Screening: Recommended annual ophthalmology exams for early  detection of glaucoma and other disorders of the eye. Is the patient up to date with their annual eye exam?  Yes  Who is the provider or what is the name of the office in which the patient attends annual eye exams? Dr. Gershon Crane  Dental Screening: Recommended annual dental exams for proper oral hygiene  Community Resource Referral / Chronic Care Management: CRR required this visit?  No   CCM required this visit?  No      Plan:     I have personally reviewed and noted the following in the patient's chart:   Medical and social history Use of alcohol, tobacco or illicit drugs  Current medications and supplements including opioid prescriptions. Patient is not currently taking opioid prescriptions. Functional ability and status Nutritional status Physical activity Advanced directives List of other physicians Hospitalizations, surgeries, and ER visits in previous 12 months Vitals Screenings to include cognitive, depression, and falls Referrals and appointments  In addition, I have reviewed and discussed with patient certain preventive  protocols, quality metrics, and best practice recommendations. A written personalized care plan for preventive services as well as general preventive health recommendations were provided to patient.   Due to this being a telephonic visit, the after visit summary with patients personalized plan was offered to patient via mail or my-chart. Patient would like to access on my-chart.    Loma Messing, LPN   [id number]   Nurse Health Advisor  Nurse Notes: none

## 2021-06-22 ENCOUNTER — Ambulatory Visit (INDEPENDENT_AMBULATORY_CARE_PROVIDER_SITE_OTHER): Payer: Medicare HMO

## 2021-06-22 VITALS — Ht 67.0 in | Wt 171.0 lb

## 2021-06-22 DIAGNOSIS — Z0000 Encounter for general adult medical examination without abnormal findings: Secondary | ICD-10-CM

## 2021-06-22 NOTE — Patient Instructions (Signed)
[name] , Thank you for taking time to complete your Medicare Wellness Visit. I appreciate your ongoing commitment to your health goals. Please review the following plan we discussed and let me know if I can assist you in the future.   Screening recommendations/referrals: Colonoscopy: no longer required Recommended yearly ophthalmology/optometry visit for glaucoma screening and checkup Recommended yearly dental visit for hygiene and checkup  Vaccinations: Influenza vaccine: Declined today, please call office or local pharmacy to schedule if you change your mind Pneumococcal vaccine: up to date Tdap vaccine: Due-May obtain vaccine at our office or your local pharmacy. Shingles vaccine: Discuss with your local pharmacy Covid-19:  newest booster available at your local pharmacy  Advanced directives: Please bring a copy of Living Will and/or [place] for your chart.   Conditions/risks identified: see problem list   Next appointment: Follow up in one year for your annual wellness visit.   Preventive Care 8 Years and Older, Male Preventive care refers to lifestyle choices and visits with your health care provider that can promote health and wellness. What does preventive care include? A yearly physical exam. This is also called an annual well check. Dental exams once or twice a year. Routine eye exams. Ask your health care provider how often you should have your eyes checked. Personal lifestyle choices, including: Daily care of your teeth and gums. Regular physical activity. Eating a healthy diet. Avoiding tobacco and drug use. Limiting alcohol use. Practicing safe sex. Taking low doses of aspirin every day. Taking vitamin and mineral supplements as recommended by your health care provider. What happens during an annual well check? The services and screenings done by your health care provider during your annual well check will depend on your age, overall health,  lifestyle risk factors, and family history of disease. Counseling  Your health care provider may ask you questions about your: Alcohol use. Tobacco use. Drug use. Emotional well-being. Home and relationship well-being. Sexual activity. Eating habits. History of falls. Memory and ability to understand (cognition). Work and work Statistician. Screening  You may have the following tests or measurements: Height, weight, and BMI. Blood pressure. Lipid and cholesterol levels. These may be checked every 5 years, or more frequently if you are over 30 years old. Skin check. Lung cancer screening. You may have this screening every year starting at age 109 if you have a 30-pack-year history of smoking and currently smoke or have quit within the past 15 years. Fecal occult blood test (FOBT) of the stool. You may have this test every year starting at age 12. Flexible sigmoidoscopy or colonoscopy. You may have a sigmoidoscopy every 5 years or a colonoscopy every 10 years starting at age 73. Prostate cancer screening. Recommendations will vary depending on your family history and other risks. Hepatitis C blood test. Hepatitis B blood test. Sexually transmitted disease (STD) testing. Diabetes screening. This is done by checking your blood sugar (glucose) after you have not eaten for a while (fasting). You may have this done every 1-3 years. Abdominal aortic aneurysm (AAA) screening. You may need this if you are a current or former smoker. Osteoporosis. You may be screened starting at age 85 if you are at high risk. Talk with your health care provider about your test results, treatment options, and if necessary, the need for more tests. Vaccines  Your health care provider may recommend certain vaccines, such as: Influenza vaccine. This is recommended every year. Tetanus, diphtheria, and acellular pertussis (Tdap, Td) vaccine.  You may need a Td booster every 10 years. Zoster vaccine. You may need this  after age 9. Pneumococcal 13-valent conjugate (PCV13) vaccine. One dose is recommended after age 70. Pneumococcal polysaccharide (PPSV23) vaccine. One dose is recommended after age 19. Talk to your health care provider about which screenings and vaccines you need and how often you need them. This information is not intended to replace advice given to you by your health care provider. Make sure you discuss any questions you have with your health care provider. Document Released: 07/24/2015 Document Revised: 03/16/2016 Document Reviewed: 04/28/2015 Elsevier Interactive Patient Education  2017 [place] Prevention in the Home Falls can cause injuries. They can happen to people of all ages. There are many things you can do to make your home safe and to help prevent falls. What can I do on the outside of my home? Regularly fix the edges of walkways and driveways and fix any cracks. Remove anything that might make you trip as you walk through a door, such as a raised step or threshold. Trim any bushes or trees on the path to your home. Use bright outdoor lighting. Clear any walking paths of anything that might make someone trip, such as rocks or tools. Regularly check to see if handrails are loose or broken. Make sure that both sides of any steps have handrails. Any raised decks and porches should have guardrails on the edges. Have any leaves, snow, or ice cleared regularly. Use sand or salt on walking paths during winter. Clean up any spills in your garage right away. This includes oil or grease spills. What can I do in the bathroom? Use night lights. Install grab bars by the toilet and in the tub and shower. Do not use towel bars as grab bars. Use non-skid mats or decals in the tub or shower. If you need to sit down in the shower, use a plastic, non-slip stool. Keep the floor dry. Clean up any water that spills on the floor as soon as it happens. Remove soap buildup in the tub or  shower regularly. Attach bath mats securely with double-sided non-slip rug tape. Do not have throw rugs and other things on the floor that can make you trip. What can I do in the bedroom? Use night lights. Make sure that you have a light by your bed that is easy to reach. Do not use any sheets or blankets that are too big for your bed. They should not hang down onto the floor. Have a firm chair that has side arms. You can use this for support while you get dressed. Do not have throw rugs and other things on the floor that can make you trip. What can I do in the kitchen? Clean up any spills right away. Avoid walking on wet floors. Keep items that you use a lot in easy-to-reach places. If you need to reach something above you, use a strong step stool that has a grab bar. Keep electrical cords out of the way. Do not use floor polish or wax that makes floors slippery. If you must use wax, use non-skid floor wax. Do not have throw rugs and other things on the floor that can make you trip. What can I do with my stairs? Do not leave any items on the stairs. Make sure that there are handrails on both sides of the stairs and use them. Fix handrails that are broken or loose. Make sure that handrails are as long as  the stairways. Check any carpeting to make sure that it is firmly attached to the stairs. Fix any carpet that is loose or worn. Avoid having throw rugs at the top or bottom of the stairs. If you do have throw rugs, attach them to the floor with carpet tape. Make sure that you have a light switch at the top of the stairs and the bottom of the stairs. If you do not have them, ask someone to add them for you. What else can I do to help prevent falls? Wear shoes that: Do not have high heels. Have rubber bottoms. Are comfortable and fit you well. Are closed at the toe. Do not wear sandals. If you use a stepladder: Make sure that it is fully opened. Do not climb a closed stepladder. Make  sure that both sides of the stepladder are locked into place. Ask someone to hold it for you, if possible. Clearly mark and make sure that you can see: Any grab bars or handrails. First and last steps. Where the edge of each step is. Use tools that help you move around (mobility aids) if they are needed. These include: Canes. Walkers. Scooters. Crutches. Turn on the lights when you go into a dark area. Replace any light bulbs as soon as they burn out. Set up your furniture so you have a clear path. Avoid moving your furniture around. If any of your floors are uneven, fix them. If there are any pets around you, be aware of where they are. Review your medicines with your doctor. Some medicines can make you feel dizzy. This can increase your chance of falling. Ask your doctor what other things that you can do to help prevent falls. This information is not intended to replace advice given to you by your health care provider. Make sure you discuss any questions you have with your health care provider. Document Released: 04/23/2009 Document Revised: 12/03/2015 Document Reviewed: 08/01/2014 Elsevier Interactive Patient Education  2017 [employer].

## 2021-07-01 ENCOUNTER — Encounter: Payer: Medicare HMO | Admitting: Family Medicine

## 2021-07-13 ENCOUNTER — Encounter: Payer: Medicare HMO | Admitting: Family Medicine

## 2021-07-19 ENCOUNTER — Other Ambulatory Visit: Payer: Self-pay | Admitting: Family Medicine

## 2021-08-15 ENCOUNTER — Other Ambulatory Visit: Payer: Self-pay | Admitting: Family Medicine

## 2021-08-16 ENCOUNTER — Other Ambulatory Visit: Payer: Self-pay | Admitting: Cardiovascular Disease

## 2021-08-24 ENCOUNTER — Other Ambulatory Visit: Payer: Self-pay | Admitting: Cardiovascular Disease

## 2021-09-13 ENCOUNTER — Telehealth: Payer: Self-pay

## 2021-09-13 ENCOUNTER — Other Ambulatory Visit: Payer: Self-pay | Admitting: Family

## 2021-09-13 ENCOUNTER — Ambulatory Visit (INDEPENDENT_AMBULATORY_CARE_PROVIDER_SITE_OTHER)
Admission: RE | Admit: 2021-09-13 | Discharge: 2021-09-13 | Disposition: A | Discharge status: Home / Self Care | Payer: Medicare HMO | Source: Ambulatory Visit | Attending: Family | Admitting: Family

## 2021-09-13 ENCOUNTER — Encounter: Payer: Self-pay | Admitting: Family

## 2021-09-13 ENCOUNTER — Telehealth (INDEPENDENT_AMBULATORY_CARE_PROVIDER_SITE_OTHER): Payer: Medicare HMO | Admitting: Family

## 2021-09-13 VITALS — Temp 97.3°F | Ht 67.0 in | Wt 168.0 lb

## 2021-09-13 DIAGNOSIS — R0609 Other forms of dyspnea: Secondary | ICD-10-CM

## 2021-09-13 DIAGNOSIS — J40 Bronchitis, not specified as acute or chronic: Secondary | ICD-10-CM

## 2021-09-13 DIAGNOSIS — R059 Cough, unspecified: Secondary | ICD-10-CM | POA: Diagnosis not present

## 2021-09-13 DIAGNOSIS — K449 Diaphragmatic hernia without obstruction or gangrene: Secondary | ICD-10-CM | POA: Diagnosis not present

## 2021-09-13 DIAGNOSIS — R062 Wheezing: Secondary | ICD-10-CM | POA: Diagnosis not present

## 2021-09-13 DIAGNOSIS — J189 Pneumonia, unspecified organism: Secondary | ICD-10-CM

## 2021-09-13 DIAGNOSIS — R5383 Other fatigue: Secondary | ICD-10-CM

## 2021-09-13 DIAGNOSIS — J90 Pleural effusion, not elsewhere classified: Secondary | ICD-10-CM

## 2021-09-13 MED ORDER — DOXYCYCLINE HYCLATE 100 MG PO TABS: 100.0000 mg | ORAL_TABLET | Freq: Two times a day (BID) | ORAL | 0 refills | Status: AC

## 2021-09-13 MED ORDER — AMOXICILLIN-POT CLAVULANATE 875-125 MG PO TABS: 1.0000 | ORAL_TABLET | Freq: Two times a day (BID) | ORAL | 0 refills | Status: DC

## 2021-09-13 MED ORDER — FUROSEMIDE 20 MG PO TABS
20.0000 mg | ORAL_TABLET | Freq: Every day | ORAL | 3 refills | Status: DC
Start: 2021-09-13 — End: 2022-09-19

## 2021-09-13 NOTE — Telephone Encounter (Signed)
Pt called back and said he waited on phone 1 1/2 hours for card appt  and did not get one yet. Pt wanted to know what else he could do. Pt said he already has appt at [facility] on 09/15/21 to see T Dugal FNP and 09/28/21 to see Dr Diona Browner for CPX. I asked pt if he had not told me he would go to UC if could not get in with card and he said yes but [place] advised the wait was over 1 hr. And pt did not want to wait that long. I advised that is not a bad wait time and pt said he and his wife would think about it and decide if going somewhere today or wait til tomorrow to talk with card again or if gets worse will call 911 and go to ED. I advised pt of T Dugal FNP and Dr Arley Phenix response to my earlier note and he said he understood but would think about it. Pt said for me not to worry about him I had done all I could do and he would decide what he was going to do. Sending note to Dr Rona Ravens FNP and Butch Penny CMA. ?

## 2021-09-13 NOTE — Telephone Encounter (Signed)
Per chart review tab pt has already had VV with T Dugal today; sending note to T Dugal FNP and [facility] CMA. ? ? ? ? ?[facility] - Client ?TELEPHONE ADVICE RECORD ?AccessNurse? ?Patient ?Name: ?[name] ?IS ?Gender: Male ?DOB: 01/24/1935 ?Age: 87 Y 69 M 18 D ?Return ?Phone ?Number: ?[id number] ?(Primary) ?Address: ?City/ ?State/ ?Zip: ?[place] ?Client [facility] - Client ?Client Site [facility] - Day ?Provider Eliezer Lofts - MD ?Contact Type Call ?Who Is Calling Patient / Member / Family / Caregiver ?Call Type Triage / Clinical ?Caller Name [name] ?Relationship To Patient Spouse ?Return Phone Number [phone] (Primary) ?Chief Complaint Heart palpitations or irregular heartbeat ?Reason for Call Symptomatic / Request for Health Information ?Initial Comment Sarah transferring patient message for callback ?Caller states her spouse is recovering from ?pneumonia and now has a severe cough and ?irregular heart beat. ?Translation No ?Nurse Assessment ?Nurse: Leilani Merl, RN, Heather Date/Time (Eastern Time): 09/13/2021 9:55:47 AM ?Confirm and document reason for call. If ?symptomatic, describe symptoms. ?---Caller states that her husband started with a cough ?and congestion. The cough started about 10 days ago. ?Does the patient have any new or worsening ?symptoms? ---Yes ?Will a triage be completed? ---Yes ?Related visit to physician within the last 2 weeks? ---No ?Does the PT have any chronic conditions? (i.e. ?diabetes, asthma, this includes High risk factors for ?pregnancy, etc.) ?---Yes ?List chronic conditions. ---previous double pneumonia ?Is this a behavioral health or substance abuse call? ---No ?Guidelines ?Guideline Title Affirmed Question Affirmed Notes Nurse Date/Time (Eastern ?Time) ?Cough - Acute ?Productive ?[1] MILD difficulty ?breathing (e.g., ?minimal/no SOB ?at rest, SOB with ?walking, pulse <100) ?AND [2] still present ?when not  coughing ?Standifer, Therapist, sports, ?Heather ?09/13/2021 9:58:07 AM ?PLEASE NOTE: All timestamps contained within this report are represented as [place] Standard Time. ?CONFIDENTIALTY NOTICE: This fax transmission is intended only for the addressee. It contains information that is legally privileged, confidential or ?otherwise protected from use or disclosure. If you are not the intended recipient, you are strictly prohibited from reviewing, disclosing, copying using ?or disseminating any of this information or taking any action in reliance on or regarding this information. If you have received this fax in error, please ?notify us immediately by telephone so that we can arrange for its return to [place]. Phone: [phone], Toll-Free: [phone], Fax: [phone] ?Page: 2 of 2 ?Call Id: [id number] ?Disp. Time (Eastern ?Time) Disposition Final User ?09/13/2021 10:03:20 AM See HCP within 4 Hours (or ?PCP triage) ?Yes Standifer, RN, Nira Conn ?Caller Disagree/Comply Comply ?Caller Understands Yes ?PreDisposition Call Doctor ?Care Advice Given Per Guideline ?SEE HCP (OR PCP TRIAGE) WITHIN 4 HOURS: * IF OFFICE WILL BE OPEN: You need to be seen within the next 3 or 4 ?hours. Call your doctor (or NP/PA) now or as soon as the office opens. CALL BACK IF: * You become worse CARE ADVICE ?given per Cough - Acute Productive (Adult) guideline. ?Comments ?User: Ave Filter, RN Date/Time Eilene Ghazi Time): 09/13/2021 10:01:44 AM ?bypass X 4, HTN, ?Referrals ?REFERRED TO PCP OFFIC ?

## 2021-09-13 NOTE — Telephone Encounter (Signed)
I spoke with pt; when pt had VV with T dugal FNP pt did not mention fast heart beat; pt said sometimes he has a fast heart beat but been going from P 88 to 97 to 105. Pt thinks heart has been beating irregularly also. Pt said little while ago after VV pt had mid dull CP that did not radiate anywhere but pt is not sure how long lasted. Pt does not have pain right now but still feels fast heart beat. Pt's BP was 140/81 but thinks has come down to 120 something over ? 68. Pt has afib a;nd is diabetic and pt said he will call the cardiologist and if they cannot see pt today he will go to [place]. UC and ED precautions given again and pt voiced understanding and appreciative of call. Sending note to Dr Diona Browner who is out of office, T Dugal FNP and Butch Penny CMA.will teams Tabitha in response to her earlier teams message and Butch Penny CMA. ?

## 2021-09-13 NOTE — Progress Notes (Signed)
Virtual telephone visit    Virtual Visit via Telephone Note   This visit type was conducted due to national recommendations for restrictions regarding the COVID-19 Pandemic (e.g. social distancing) in an effort to limit this patient's exposure and mitigate transmission in our community. Due to his co-morbid illnesses, this patient is at least at moderate risk for complications without adequate follow up. This format is felt to be most appropriate for this patient at this time. The patient did not have access to video technology or had technical difficulties with video requiring transitioning to audio format only (telephone). Physical exam was limited to content and character of the telephone converstion. CMA was able to get the patient set up on a telephone visit.   Patient location: Home. Patient and provider in visit Provider location: Office  I discussed the limitations of evaluation and management by telemedicine and the availability of in person appointments. The patient expressed understanding and agreed to proceed.   Visit Date: 09/13/2021  Today's healthcare provider: Eugenia Pancoast, FNP     Subjective:    Patient ID: [name], male    DOB: [birth year], 87 y.o.   MRN: [id number]  Chief Complaint  Patient presents with   Cough    Pt stated-- coughing with green mucus, chills, lower grade fever--10 days. Tried mucinex and inhaler.    Cough Associated symptoms include shortness of breath and wheezing. Pertinent negatives include no chest pain, chills, ear pain, fever or sore throat.   87 y/o male with concerns.  Unable to use video visit.  Coughing with green mucous, chills, lower grade fever 97.3 F.  Feeling sob here and there. Wife says he is wheezing all of the time.  Sx have been for the last ten days  Mucinex with some relief and also has an albuterol inhaler he has been having to use it in the morning at maybe one time during the day. Also using prior to  bedtime.  Has not tested for covid.  Sob provoked with walking.   Did have covid back in June and had been in the hospital with sepsis and penumonia.  Last CXR 02/05/21  IMPRESSION: 1. Improving left greater than right basilar opacities, compatible with pneumonia.  Past Medical History:  Diagnosis Date   CAD (coronary artery disease)    s/p stent mid LAD 2005 // s/p CABG 2016 // Myoview 8/19:  EF 64, no ischemia   Coronary artery disease involving native coronary artery of native heart without angina pectoris    Depression    DJD (degenerative joint disease)    DM type 2 with diabetic peripheral neuropathy ([place]) 03/10/2017   E coli bacteremia 04/20/2016   Essential hypertension 04/20/2016   GERD (gastroesophageal reflux disease)    History of echocardiogram    Echo 8/19: Moderate LVH, EF 60-65, normal wall motion, grade 1 diastolic dysfunction, mild MR, mild LAE, normal RVSF, mild TR, PASP 25   History of hiatal hernia    HTN (hypertension)    Hyperlipidemia    Neuropathy due to type 2 diabetes mellitus ([place]) 03/10/2017   PERCUTANEOUS TRANSLUMINAL CORONARY ANGIOPLASTY, HX OF 11/26/2007   Annotation: With CYPHER STENT. Qualifier: Diagnosis of  By: Danny Lawless CMA, [place]     Pre-diabetes    S/P CABG x 4 11/03/2014   S/P total knee replacement 04/11/2016   Sepsis due to Escherichia coli (E. coli) ([place]) 04/20/2016   Tinea pedis 09/08/2017    Past Surgical History:  Procedure Laterality Date  BRAIN SURGERY     CARDIAC CATHETERIZATION     CORONARY ARTERY BYPASS GRAFT N/A 11/03/2014   Procedure: CORONARY ARTERY BYPASS GRAFTING (CABG), ON PUMP, TIMES FOUR, USING LEFT INTERNAL MAMMARY, RIGHT GREATER SAPHENOUS VEIN HARVESTED ENDOSCOPICALLY;  Surgeon: Ivin Poot, MD;  Location: [facility];  Service: Open Heart Surgery;  Laterality: N/A;  LIMA-LAD; SVG-DIAG; SVG-OM; SVG-RCA   CRANIOTOMY N/A 01/18/2014   Procedure: CRANIOTOMY HEMATOMA EVACUATION SUBDURAL;  Surgeon: Hosie Spangle, MD;   Location: [place];  Service: Neurosurgery;  Laterality: N/A;   kidney stone removal     KNEE SURGERY     LEFT HEART CATHETERIZATION WITH CORONARY ANGIOGRAM N/A 07/27/2011   Procedure: LEFT HEART CATHETERIZATION WITH CORONARY ANGIOGRAM;  Surgeon: Burnell Blanks, MD;  Location[facility] C[facility] LAB;  Service: Cardiovascular;  Laterality: N/A;   LEFT HEART CATHETERIZATION WITH CORONARY ANGIOGRAM N/A 10/31/2014   Procedure: LEFT HEART CATHETERIZATION WITH CORONARY ANGIOGRAM;  Surgeon: Sherren Mocha, MD;  Location: [facility] CATH LAB;  Service: Cardiovascular;  Laterality: N/A;   PTCA     Hx of it.    SIGMOIDOSCOPY     TEE WITHOUT CARDIOVERSION N/A 11/03/2014   Procedure: TRANSESOPHAGEAL ECHOCARDIOGRAM (TEE);  Surgeon: Ivin Poot, MD;  Location: [place];  Service: Open Heart Surgery;  Laterality: N/A;   TOTAL KNEE ARTHROPLASTY  02/2011   Right knee   TOTAL KNEE ARTHROPLASTY Left 04/11/2016   Procedure: LEFT TOTAL KNEE ARTHROPLASTY;  Surgeon: Vickey Huger, MD;  Location: [place];  Service: Orthopedics;  Laterality: Left;    Family History  Problem Relation Age of Onset   Heart disease Father    Heart attack Father    Heart failure Mother    COPD Mother    Heart disease Sister    Heart attack Sister    Heart attack Brother     Social History   Socioeconomic History   Marital status: Married    Spouse name: Not on file   Number of children: Not on file   Years of education: Not on file   Highest education level: Not on file  Occupational History   Not on file  Tobacco Use   Smoking status: Former   Smokeless tobacco: Former   Tobacco comments:    quit in 1985. smoked 1ppd for 35 years   Vaping Use   Vaping Use: Never used  Substance and Sexual Activity   Alcohol use: No   Drug use: No   Sexual activity: Never  Other Topics Concern   Not on file  Social History Narrative   Married with children. 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 [name] 05/18/10. 10:20 am     Has living will,  HCPOA: jerrie Horwitz, full code (reviewed 71)   Social Determinants of Health   Financial Resource Strain: Low Risk    Difficulty of Paying Living Expenses: Not hard at all  Food Insecurity: No Food Insecurity   Worried About Charity fundraiser in the Last Year: Never true   Arboriculturist in the Last Year: Never true  Transportation Needs: No Transportation Needs   Lack of Transportation (Medical): No   Lack of Transportation (Non-Medical): No  Physical Activity: Inactive   Days of Exercise per Week: 0 days   Minutes of Exercise per Session: 0 min  Stress: No Stress Concern Present   Feeling of Stress : Not at all  Social Connections: Socially Integrated   Frequency of Communication with Friends  and Family: More than three times a week   Frequency of Social Gatherings with Friends and Family: Three times a week   Attends Religious Services: More than 4 times per year   Active Member of Clubs or Organizations: Yes   Attends Music therapist: More than 4 times per year   Marital Status: Married  Human resources officer Violence: Not At Risk   Fear of Current or Ex-Partner: No   Emotionally Abused: No   Physically Abused: No   Sexually Abused: No    Outpatient Medications Prior to Visit  Medication Sig Dispense Refill   acetaminophen (TYLENOL) 500 MG tablet Take 500-1,000 mg by mouth 2 (two) times daily as needed (for pain).      albuterol (VENTOLIN HFA) 108 (90 Base) MCG/ACT inhaler Inhale 2 puffs into the lungs every 6 (six) hours as needed for wheezing or shortness of breath. 18 g 0   amLODipine (NORVASC) 5 MG tablet Take 1 tablet by mouth once daily 90 tablet 2   cholecalciferol (VITAMIN D3) 25 MCG (1000 UNIT) tablet Take 1,000 Units by mouth daily.     ELIQUIS 5 MG TABS tablet Take 1 tablet by mouth twice daily 180 tablet 0   isosorbide mononitrate (IMDUR) 30 MG 24 hr tablet Take 1 tablet by mouth once daily 90 tablet 2   lisinopril (ZESTRIL) 40  MG tablet Take 1 tablet by mouth once daily 90 tablet 2   Multiple Vitamins-Minerals (ONE-A-DAY MENS HEALTH FORMULA PO) Take 1 tablet by mouth daily.     nitroGLYCERIN (NITROSTAT) 0.4 MG SL tablet Place 1 tablet (0.4 mg total) under the tongue every 5 (five) minutes as needed for chest pain. 25 tablet 3   pantoprazole (PROTONIX) 40 MG tablet Take 1 tablet by mouth once daily 90 tablet 0   rosuvastatin (CRESTOR) 5 MG tablet Take 1 tablet by mouth once daily 90 tablet 1   vitamin B-12 (CYANOCOBALAMIN) 1000 MCG tablet Take 1,000 mcg by mouth daily.     No facility-administered medications prior to visit.    Allergies  Allergen Reactions   Protamine Rash    Hypotension, Rash, unstable vitals.     Statins Other (See Comments)    Caused soreness (Lipitor, Zocor)   Adhesive [Tape] Hives   Rosuvastatin Calcium Other (See Comments)    Caused soreness (Lipitor, Zocor) 12/28/2020 Pt can tolerate low dose   Imodium [Loperamide] Itching and Rash    Review of Systems  Constitutional:  Negative for chills and fever.  HENT:  Negative for congestion, ear pain, sinus pain and sore throat.   Respiratory:  Positive for cough, sputum production, shortness of breath and wheezing.   Cardiovascular:  Negative for chest pain.  All other systems reviewed and are negative.     Objective:    Physical Exam Neurological:     Mental Status: He is alert.    Temp (!) 97.3 F (36.3 C)    Ht '5\' 7"'$  (1.702 m)    Wt 168 lb (76.2 kg)    BMI 26.31 kg/m  Wt Readings from Last 3 Encounters:  09/13/21 168 lb (76.2 kg)  06/22/21 171 lb (77.6 kg)  02/09/21 171 lb 9.6 oz (77.8 kg)        Assessment & Plan:   Problem List Items Addressed This Visit       Respiratory   Bronchitis - Primary    Take antibiotic as prescribed, pending chest xray may also send doxycycline if pneumonia and consider steroid  for sob. Increase oral fluids. Short term f/u pt advised to make apt in two days for f/u in office. If  worsening sob go to er or call 911      Relevant Medications   amoxicillin-clavulanate (AUGMENTIN) 875-125 MG tablet   Other Relevant Orders   DG Chest 2 View     Other   Wheezing    cxr today, pending results Want to r/o pneumonia as h/o this. Hesitant to send steroid as he states last time elevated blood sugar requiring insulin. Denies DM2 history but record states he does have DM2 dx. Last glucose elevated 8 months ago. No recent        I am having Elyn Aquas. Sada start on amoxicillin-clavulanate. I am also having him maintain his acetaminophen, Multiple Vitamins-Minerals (ONE-A-DAY MENS HEALTH FORMULA PO), cholecalciferol, vitamin B-12, isosorbide mononitrate, albuterol, rosuvastatin, nitroGLYCERIN, pantoprazole, Eliquis, lisinopril, and amLODipine.  Meds ordered this encounter  Medications   amoxicillin-clavulanate (AUGMENTIN) 875-125 MG tablet    Sig: Take 1 tablet by mouth 2 (two) times daily.    Dispense:  20 tablet    Refill:  0    Order Specific Question:   Supervising Provider    Answer:   BEDSOLE, AMY E [[id number]]     I discussed the assessment and treatment plan with the patient. The patient was provided an opportunity to ask questions and all were answered. The patient agreed with the plan and demonstrated an understanding of the instructions.   The patient was advised to call back or seek an in-person evaluation if the symptoms worsen or if the condition fails to improve as anticipated.  I provided 17 minutes of non-face-to-face time during this encounter.   Eugenia Pancoast, [place] [facility] [place] [phone] (phone) [phone] (fax)  [place]

## 2021-09-13 NOTE — Assessment & Plan Note (Signed)
Take antibiotic as prescribed, pending chest xray may also send doxycycline if pneumonia and consider steroid for sob. Increase oral fluids. Short term f/u pt advised to make apt in two days for f/u in office. If worsening sob go to er or call 911 ?

## 2021-09-13 NOTE — Assessment & Plan Note (Signed)
cxr today, pending results ?Want to r/o pneumonia as h/o this. Hesitant to send steroid as he states last time elevated blood sugar requiring insulin. Denies DM2 history but record states he does have DM2 dx. Last glucose elevated 8 months ago. No recent  ?

## 2021-09-13 NOTE — Telephone Encounter (Signed)
Noted and agree with cardiac consult or  urgent care/other in person appt ASAP. ?

## 2021-09-14 NOTE — Progress Notes (Signed)
Sending another message to cardiologist office via secure chat to see if they have tried to schedule him in.

## 2021-09-14 NOTE — Progress Notes (Signed)
Noted. Appreciate your care. ?

## 2021-09-14 NOTE — Telephone Encounter (Signed)
Pt's wife called back to see if appt that was offered to pt today was available. I advised that was the UC. She said they went there and had to wait 2 hrs. Will wait to see Tabitha tomorrow. Will go to ER if there are any drastic changes. Seec card next week. ?

## 2021-09-14 NOTE — Telephone Encounter (Signed)
Craig Pancoast, [name] ?to Me   ?   8:49 AM ? Thank you for the follow up. I did speak with cardiologist yesterday and they were supposed to be reaching out to him to fit him in.  ? ? ? ? ?Yes ma'am pt was waiting on card to call him and he did say if had not heard by 9 AM or shortly after he would call cardiology himself. Thank you so much. ?

## 2021-09-14 NOTE — Telephone Encounter (Signed)
I spoke with pt this morning and he said he had a pretty good night and he has not noticed fast or irregular heart beat this morning. Pts wife has gone to take someone to doctors office and no available appts at [facility] today and pt has not heard from cardiology yet; I offered to schedule pt an appt at [facility] [place] today at 12 noon and pt declined and said he would wait to see what card can do and when pt's wife will be home. UC & ED precautions given again and pt voiced understanding and pt appreciative of call but will wait to see if can see card. Sending note to Dr Diona Browner and Eugenia Pancoast FNP. ?

## 2021-09-15 ENCOUNTER — Ambulatory Visit: Payer: Medicare HMO | Admitting: Family

## 2021-09-16 ENCOUNTER — Ambulatory Visit: Payer: Medicare HMO | Admitting: Internal Medicine

## 2021-09-16 ENCOUNTER — Ambulatory Visit: Payer: Medicare HMO | Admitting: Family

## 2021-09-16 ENCOUNTER — Other Ambulatory Visit: Payer: Self-pay

## 2021-09-16 ENCOUNTER — Ambulatory Visit (INDEPENDENT_AMBULATORY_CARE_PROVIDER_SITE_OTHER): Payer: Medicare HMO | Admitting: Nurse Practitioner

## 2021-09-16 VITALS — BP 110/64 | HR 108 | Temp 97.4°F | Resp 14 | Wt 168.0 lb

## 2021-09-16 DIAGNOSIS — J90 Pleural effusion, not elsewhere classified: Secondary | ICD-10-CM | POA: Diagnosis not present

## 2021-09-16 DIAGNOSIS — R0609 Other forms of dyspnea: Secondary | ICD-10-CM | POA: Insufficient documentation

## 2021-09-16 DIAGNOSIS — E1142 Type 2 diabetes mellitus with diabetic polyneuropathy: Secondary | ICD-10-CM | POA: Diagnosis not present

## 2021-09-16 DIAGNOSIS — J189 Pneumonia, unspecified organism: Secondary | ICD-10-CM | POA: Diagnosis not present

## 2021-09-16 DIAGNOSIS — R5383 Other fatigue: Secondary | ICD-10-CM

## 2021-09-16 NOTE — Assessment & Plan Note (Signed)
Seen virtually asking get an outpatient chest x-ray.  Came back with questioning of right lobe pneumonia and left pleural effusion.  Patient was started on Augmentin and doxycycline.  Been taking medications for couple days and has noticed some improvement.  Both antibiotics were written for 10 days.  Encouraged patient to take medication to completion regardless of how he feels.  Pending lab results did go ahead and order 4-week follow-up chest x-ray ?

## 2021-09-16 NOTE — Assessment & Plan Note (Signed)
States been having some dyspnea on exertion.  Patient's O2 sat lowest he got with walking around the office was 93%.  Patient was feeling moderately winded at that point.  Continue medications including Lasix.  Pending lab results patient also has follow-up with cardiology in 1 week. ?

## 2021-09-16 NOTE — Progress Notes (Signed)
Acute Office Visit  Subjective:    Patient ID: [name], male    DOB: 1934/12/06, 87 y.o.   MRN: [id number]  Chief Complaint  Patient presents with   Follow-up    More SOB. Taking both antibiotics that were prescribed recently.    HPI Patient is in today for PNA follow up  Was seen on on 09/14/2021 (virtually) for possible pna. He went and had a chest xray on 09/13/2021. States that possible pna and left pleural effusion. He was placed on 2 antibiotics of doxycycline and Augmentin. Patient reports some improvement with medication. He has been using the albuterol inhaler about 3 times daily that does help some. He was also recently started back on lasix. Looks as if he use to be on '40mg'$  but was placed on '20mg'$ . He is followed by cardiology and they have reached out and he does have an appointment on 09/21/2021.  Patient states since being sick he is experiencing DOE. Steroids were deferred because last time steroids were used his blood sugar had to be managed with insulin per patient report  Past Medical History:  Diagnosis Date   CAD (coronary artery disease)    s/p stent mid LAD 2005 // s/p CABG 2016 // Myoview 8/19:  EF 64, no ischemia   Coronary artery disease involving native coronary artery of native heart without angina pectoris    Depression    DJD (degenerative joint disease)    DM type 2 with diabetic peripheral neuropathy ([place]) 03/10/2017   E coli bacteremia 04/20/2016   Essential hypertension 04/20/2016   GERD (gastroesophageal reflux disease)    History of echocardiogram    Echo 8/19: Moderate LVH, EF 60-65, normal wall motion, grade 1 diastolic dysfunction, mild MR, mild LAE, normal RVSF, mild TR, PASP 25   History of hiatal hernia    HTN (hypertension)    Hyperlipidemia    Neuropathy due to type 2 diabetes mellitus ([place]) 03/10/2017   PERCUTANEOUS TRANSLUMINAL CORONARY ANGIOPLASTY, HX OF 11/26/2007   Annotation: With CYPHER STENT. Qualifier: Diagnosis of  By:  Danny Lawless CMA, [place]     Pre-diabetes    S/P CABG x 4 11/03/2014   S/P total knee replacement 04/11/2016   Sepsis due to Escherichia coli (E. coli) ([place]) 04/20/2016   Tinea pedis 09/08/2017    Past Surgical History:  Procedure Laterality Date   BRAIN SURGERY     CARDIAC CATHETERIZATION     CORONARY ARTERY BYPASS GRAFT N/A 11/03/2014   Procedure: CORONARY ARTERY BYPASS GRAFTING (CABG), ON PUMP, TIMES FOUR, USING LEFT INTERNAL MAMMARY, RIGHT GREATER SAPHENOUS VEIN HARVESTED ENDOSCOPICALLY;  Surgeon: Ivin Poot, MD;  Location: [facility];  Service: Open Heart Surgery;  Laterality: N/A;  LIMA-LAD; SVG-DIAG; SVG-OM; SVG-RCA   CRANIOTOMY N/A 01/18/2014   Procedure: CRANIOTOMY HEMATOMA EVACUATION SUBDURAL;  Surgeon: Hosie Spangle, MD;  Location: [place];  Service: Neurosurgery;  Laterality: N/A;   kidney stone removal     KNEE SURGERY     LEFT HEART CATHETERIZATION WITH CORONARY ANGIOGRAM N/A 07/27/2011   Procedure: LEFT HEART CATHETERIZATION WITH CORONARY ANGIOGRAM;  Surgeon: Burnell Blanks, MD;  Location: [facility] CATH LAB;  Service: Cardiovascular;  Laterality: N/A;   LEFT HEART CATHETERIZATION WITH CORONARY ANGIOGRAM N/A 10/31/2014   Procedure: LEFT HEART CATHETERIZATION WITH CORONARY ANGIOGRAM;  Surgeon: Sherren Mocha, MD;  Location: [facility] CATH LAB;  Service: Cardiovascular;  Laterality: N/A;   PTCA     Hx of it.    SIGMOIDOSCOPY  TEE WITHOUT CARDIOVERSION N/A 11/03/2014   Procedure: TRANSESOPHAGEAL ECHOCARDIOGRAM (TEE);  Surgeon: Ivin Poot, MD;  Location: [place];  Service: Open Heart Surgery;  Laterality: N/A;   TOTAL KNEE ARTHROPLASTY  02/2011   Right knee   TOTAL KNEE ARTHROPLASTY Left 04/11/2016   Procedure: LEFT TOTAL KNEE ARTHROPLASTY;  Surgeon: Vickey Huger, MD;  Location: [place];  Service: Orthopedics;  Laterality: Left;    Family History  Problem Relation Age of Onset   Heart disease Father    Heart attack Father    Heart failure Mother    COPD Mother    Heart disease Sister     Heart attack Sister    Heart attack Brother     Social History   Socioeconomic History   Marital status: Married    Spouse name: Not on file   Number of children: Not on file   Years of education: Not on file   Highest education level: Not on file  Occupational History   Not on file  Tobacco Use   Smoking status: Former   Smokeless tobacco: Former   Tobacco comments:    quit in 1985. smoked 1ppd for 35 years   Vaping Use   Vaping Use: Never used  Substance and Sexual Activity   Alcohol use: No   Drug use: No   Sexual activity: Never  Other Topics Concern   Not on file  Social History Narrative   Married with children. 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 10:20 am    Has living will,  HCPOA: jerrie Wallner, full code (reviewed 98)   Social Determinants of Health   Financial Resource Strain: Low Risk    Difficulty of Paying Living Expenses: Not hard at all  Food Insecurity: No Food Insecurity   Worried About Charity fundraiser in the Last Year: Never true   Arboriculturist in the Last Year: Never true  Transportation Needs: No Transportation Needs   Lack of Transportation (Medical): No   Lack of Transportation (Non-Medical): No  Physical Activity: Inactive   Days of Exercise per Week: 0 days   Minutes of Exercise per Session: 0 min  Stress: No Stress Concern Present   Feeling of Stress : Not at all  Social Connections: Socially Integrated   Frequency of Communication with Friends and Family: More than three times a week   Frequency of Social Gatherings with Friends and Family: Three times a week   Attends Religious Services: More than 4 times per year   Active Member of Clubs or Organizations: Yes   Attends Music therapist: More than 4 times per year   Marital Status: Married  Human resources officer Violence: Not At Risk   Fear of Current or Ex-Partner: No   Emotionally Abused: No   Physically Abused: No    Sexually Abused: No    Outpatient Medications Prior to Visit  Medication Sig Dispense Refill   acetaminophen (TYLENOL) 500 MG tablet Take 500-1,000 mg by mouth 2 (two) times daily as needed (for pain).      albuterol (VENTOLIN HFA) 108 (90 Base) MCG/ACT inhaler Inhale 2 puffs into the lungs every 6 (six) hours as needed for wheezing or shortness of breath. 18 g 0   amLODipine (NORVASC) 5 MG tablet Take 1 tablet by mouth once daily 90 tablet 2   amoxicillin-clavulanate (AUGMENTIN) 875-125 MG tablet Take 1 tablet by mouth 2 (two) times daily. 20 tablet 0  cholecalciferol (VITAMIN D3) 25 MCG (1000 UNIT) tablet Take 1,000 Units by mouth daily.     doxycycline (VIBRA-TABS) 100 MG tablet Take 1 tablet (100 mg total) by mouth 2 (two) times daily for 10 days. 20 tablet 0   ELIQUIS 5 MG TABS tablet Take 1 tablet by mouth twice daily 180 tablet 0   furosemide (LASIX) 20 MG tablet Take 1 tablet (20 mg total) by mouth daily. 30 tablet 3   isosorbide mononitrate (IMDUR) 30 MG 24 hr tablet Take 1 tablet by mouth once daily 90 tablet 2   lisinopril (ZESTRIL) 40 MG tablet Take 1 tablet by mouth once daily 90 tablet 2   Multiple Vitamins-Minerals (ONE-A-DAY MENS HEALTH FORMULA PO) Take 1 tablet by mouth daily.     nitroGLYCERIN (NITROSTAT) 0.4 MG SL tablet Place 1 tablet (0.4 mg total) under the tongue every 5 (five) minutes as needed for chest pain. 25 tablet 3   pantoprazole (PROTONIX) 40 MG tablet Take 1 tablet by mouth once daily 90 tablet 0   rosuvastatin (CRESTOR) 5 MG tablet Take 1 tablet by mouth once daily 90 tablet 1   vitamin B-12 (CYANOCOBALAMIN) 1000 MCG tablet Take 1,000 mcg by mouth daily.     No facility-administered medications prior to visit.    Allergies  Allergen Reactions   Protamine Rash    Hypotension, Rash, unstable vitals.     Statins Other (See Comments)    Caused soreness (Lipitor, Zocor)   Adhesive [Tape] Hives   Rosuvastatin Calcium Other (See Comments)    Caused  soreness (Lipitor, Zocor) 12/28/2020 Pt can tolerate low dose   Imodium [Loperamide] Itching and Rash    Review of Systems  Constitutional:  Positive for appetite change and fatigue. Negative for chills and fever.  Respiratory:  Positive for cough (was real green and has lighented up) and shortness of breath (DOE).   Cardiovascular:  Negative for chest pain.  Gastrointestinal:  Negative for abdominal pain, diarrhea, nausea and vomiting.  Neurological:  Negative for dizziness, weakness and light-headedness.      Objective:    Physical Exam Vitals and nursing note reviewed.  Constitutional:      General: He is not in acute distress.    Appearance: Normal appearance. He is not ill-appearing.  Cardiovascular:     Rate and Rhythm: Normal rate. Rhythm irregular.     Heart sounds: Normal heart sounds.  Pulmonary:     Effort: Pulmonary effort is normal.     Breath sounds: Examination of the left-lower field reveals rales. Rales present. No wheezing.  Musculoskeletal:     Right lower leg: No edema.     Left lower leg: No edema.  Skin:    General: Skin is warm.  Neurological:     Mental Status: He is alert.    BP 110/64    Pulse (!) 108    Temp (!) 97.4 F (36.3 C)    Resp 14    Wt 168 lb (76.2 kg)    SpO2 95%    BMI 26.31 kg/m  Wt Readings from Last 3 Encounters:  09/16/21 168 lb (76.2 kg)  09/13/21 168 lb (76.2 kg)  06/22/21 171 lb (77.6 kg)    Health Maintenance Due  Topic Date Due   Zoster Vaccines- Shingrix (1 of 2) Never done   COVID-19 Vaccine (3 - Booster for Pfizer series) 12/03/2019   HEMOGLOBIN A1C  12/14/2020   OPHTHALMOLOGY EXAM  03/11/2021   FOOT EXAM  06/23/2021  There are no preventive care reminders to display for this patient.   Lab Results  Component Value Date   TSH 1.24 06/23/2020   Lab Results  Component Value Date   WBC 8.1 02/05/2021   HGB 11.8 (L) 02/05/2021   HCT 36.7 (L) 02/05/2021   MCV 89.3 02/05/2021   PLT 197 02/05/2021   Lab  Results  Component Value Date   NA 137 01/08/2021   K 5.0 01/08/2021   CO2 24 01/08/2021   GLUCOSE 112 (H) 01/08/2021   BUN 21 01/08/2021   CREATININE 1.06 01/08/2021   BILITOT 1.1 01/08/2021   ALKPHOS 46 01/01/2021   AST 11 01/08/2021   ALT 18 01/08/2021   PROT 6.0 (L) 01/08/2021   ALBUMIN 2.5 (L) 01/01/2021   CALCIUM 9.0 01/08/2021   ANIONGAP 5 01/01/2021   GFR 80.09 06/15/2020   Lab Results  Component Value Date   CHOL 132 06/15/2020   Lab Results  Component Value Date   HDL 42.60 06/15/2020   Lab Results  Component Value Date   LDLCALC 69 06/15/2020   Lab Results  Component Value Date   TRIG 111 12/27/2020   Lab Results  Component Value Date   CHOLHDL 3 06/15/2020   Lab Results  Component Value Date   HGBA1C 6.7 (H) 06/15/2020       Assessment & Plan:   Problem List Items Addressed This Visit       Respiratory   Pneumonia of right middle lobe due to infectious organism - Primary    Seen virtually asking get an outpatient chest x-ray.  Came back with questioning of right lobe pneumonia and left pleural effusion.  Patient was started on Augmentin and doxycycline.  Been taking medications for couple days and has noticed some improvement.  Both antibiotics were written for 10 days.  Encouraged patient to take medication to completion regardless of how he feels.  Pending lab results did go ahead and order 4-week follow-up chest x-ray      Relevant Orders   DG Chest 2 View   Pleural effusion    Finding on most recent chest x-ray.  Patient was placed on furosemide 20 mg daily.  He has been taking medication as prescribed.  Does not notice a huge difference per his report.  No lower edema.  Pending lab work        Endocrine   DM type 2 with diabetic peripheral neuropathy (HCC) (Chronic)   Relevant Orders   Hemoglobin A1c     Other   Fatigue   Dyspnea on exertion    States been having some dyspnea on exertion.  Patient's O2 sat lowest he got with  walking around the office was 93%.  Patient was feeling moderately winded at that point.  Continue medications including Lasix.  Pending lab results patient also has follow-up with cardiology in 1 week.        No orders of the defined types were placed in this encounter.  This visit occurred during the SARS-CoV-2 public health emergency.  Safety protocols were in place, including screening questions prior to the visit, additional usage of staff PPE, and extensive cleaning of exam room while observing appropriate contact time as indicated for disinfecting solutions.    Romilda Garret, NP

## 2021-09-16 NOTE — Progress Notes (Deleted)
? ?Cardiology Office Note:   ? ?Date:  09/16/2021  ? ?ID:  [name], DOB 1934-08-04, MRN [id number] ? ?PCP:  Jinny Sanders, MD ?  ?[facility] Providers ?Cardiologist:  Lauree Chandler, MD { ?Click to update primary MD,subspecialty MD or APP then REFRESH:1}   ? ?Referring MD: Jinny Sanders, MD  ? ?No chief complaint on file. ?*** ? ?History of Present Illness:   ? ?[name] is a 87 y.o. male with a hx of *** ? ?Past Medical History:  ?Diagnosis Date  ? CAD (coronary artery disease)   ? s/p stent mid LAD 2005 // s/p CABG 2016 // Myoview 8/19:  EF 64, no ischemia  ? Coronary artery disease involving native coronary artery of native heart without angina pectoris   ? Depression   ? DJD (degenerative joint disease)   ? DM type 2 with diabetic peripheral neuropathy ([place]) 03/10/2017  ? E coli bacteremia 04/20/2016  ? Essential hypertension 04/20/2016  ? GERD (gastroesophageal reflux disease)   ? History of echocardiogram   ? Echo 8/19: Moderate LVH, EF 60-65, normal wall motion, grade 1 diastolic dysfunction, mild MR, mild LAE, normal RVSF, mild TR, PASP 25  ? History of hiatal hernia   ? HTN (hypertension)   ? Hyperlipidemia   ? Neuropathy due to type 2 diabetes mellitus ([place]) 03/10/2017  ? PERCUTANEOUS TRANSLUMINAL CORONARY ANGIOPLASTY, HX OF 11/26/2007  ? Annotation: With CYPHER STENT. Qualifier: Diagnosis of  By: [place], [place]    ? Pre-diabetes   ? S/P CABG x 4 11/03/2014  ? S/P total knee replacement 04/11/2016  ? Sepsis due to Escherichia coli (E. coli) ([place]) 04/20/2016  ? Tinea pedis 09/08/2017  ? ? ?Past Surgical History:  ?Procedure Laterality Date  ? BRAIN SURGERY    ? CARDIAC CATHETERIZATION    ? CORONARY ARTERY BYPASS GRAFT N/A 11/03/2014  ? Procedure: CORONARY ARTERY BYPASS GRAFTING (CABG), ON PUMP, TIMES FOUR, USING LEFT INTERNAL MAMMARY, RIGHT GREATER SAPHENOUS VEIN HARVESTED ENDOSCOPICALLY;  Surgeon: Ivin Poot, MD;  Location: [facility];  Service: Open Heart Surgery;  Laterality: N/A;   LIMA-LAD; SVG-DIAG; SVG-OM; SVG-RCA  ? CRANIOTOMY N/A 01/18/2014  ? Procedure: CRANIOTOMY HEMATOMA EVACUATION SUBDURAL;  Surgeon: Hosie Spangle, MD;  Location: [place];  Service: Neurosurgery;  Laterality: N/A;  ? kidney stone removal    ? KNEE SURGERY    ? LEFT HEART CATHETERIZATION WITH CORONARY ANGIOGRAM N/A 07/27/2011  ? Procedure: LEFT HEART CATHETERIZATION WITH CORONARY ANGIOGRAM;  Surgeon: Burnell Blanks, MD;  Location: [facility] CATH LAB;  Service: Cardiovascular;  Laterality: N/A;  ? LEFT HEART CATHETERIZATION WITH CORONARY ANGIOGRAM N/A 10/31/2014  ? Procedure: LEFT HEART CATHETERIZATION WITH CORONARY ANGIOGRAM;  Surgeon: Sherren Mocha, MD;  Location: [facility] CATH LAB;  Service: Cardiovascular;  Laterality: N/A;  ? PTCA    ? Hx of it.   ? SIGMOIDOSCOPY    ? TEE WITHOUT CARDIOVERSION N/A 11/03/2014  ? Procedure: TRANSESOPHAGEAL ECHOCARDIOGRAM (TEE);  Surgeon: Ivin Poot, MD;  Location: [place];  Service: Open Heart Surgery;  Laterality: N/A;  ? TOTAL KNEE ARTHROPLASTY  02/2011  ? Right knee  ? TOTAL KNEE ARTHROPLASTY Left 04/11/2016  ? Procedure: LEFT TOTAL KNEE ARTHROPLASTY;  Surgeon: Vickey Huger, MD;  Location: [place];  Service: Orthopedics;  Laterality: Left;  ? ? ?Current Medications: ?No outpatient medications have been marked as taking for the 09/21/21 encounter (Appointment) with Deberah Pelton, NP.  ?  ? ?Allergies:   Protamine, Statins,  Adhesive [tape], Rosuvastatin calcium, and Imodium [loperamide]  ? ?Social History  ? ?Socioeconomic History  ? Marital status: Married  ?  Spouse name: Not on file  ? Number of children: Not on file  ? Years of education: Not on file  ? Highest education level: Not on file  ?Occupational History  ? Not on file  ?Tobacco Use  ? Smoking status: Former  ? Smokeless tobacco: Former  ? Tobacco comments:  ?  quit in 1985. smoked 1ppd for 35 years   ?Vaping Use  ? Vaping Use: Never used  ?Substance and Sexual Activity  ? Alcohol use: No  ? Drug use: No  ? Sexual activity:  Never  ?Other Topics Concern  ? Not on file  ?Social History Narrative  ? Married with children. 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 Has living will,  HCPOA: jerrie Sigman, full code (reviewed 2015)  ? ?Social Determinants of Health  ? ?Financial Resource Strain: Low Risk   ? Difficulty of Paying Living Expenses: Not hard at all  ?Food Insecurity: No Food Insecurity  ? Worried About Charity fundraiser in the Last Year: Never true  ? Ran Out of Food in the Last Year: Never true  ?Transportation Needs: No Transportation Needs  ? Lack of Transportation (Medical): No  ? Lack of Transportation (Non-Medical): No  ?Physical Activity: Inactive  ? Days of Exercise per Week: 0 days  ? Minutes of Exercise per Session: 0 min  ?Stress: No Stress Concern Present  ? Feeling of Stress : Not at all  ?Social Connections: Socially Integrated  ? Frequency of Communication with Friends and Family: More than three times a week  ? Frequency of Social Gatherings with Friends and Family: Three times a week  ? Attends Religious Services: More than 4 times per year  ? Active Member of Clubs or Organizations: Yes  ? Attends Archivist Meetings: More than 4 times per year  ? Marital Status: Married  ?  ? ?Family History: ?The patient's ***family history includes COPD in his mother; Heart attack in his brother, father, and sister; Heart disease in his father and sister; Heart failure in his mother. ? ?ROS:   ?Please see the history of present illness.    ?*** All other systems reviewed and are negative. ? ? ?Risk Assessment/Calculations:   ?{Does this patient have ATRIAL FIBRILLATION?:[id number]} ? ?    ? ?Physical Exam:   ? ?VS:  There were no vitals taken for this visit.   ? ?Wt Readings from Last 3 Encounters:  ?09/13/21 168 lb (76.2 kg)  ?06/22/21 171 lb (77.6 kg)  ?02/09/21 171 lb 9.6 oz (77.8 kg)  ?  ? ?GEN: *** Well nourished, well developed in no acute distress ?HEENT:  Normal ?NECK: No JVD; No carotid bruits ?LYMPHATICS: No lymphadenopathy ?CARDIAC: ***RRR, no murmurs, rubs, gallops ?RESPIRATORY:  Clear to auscultation without rales, wheezing or rhonchi  ?ABDOMEN: Soft, non-tender, non-distended ?MUSCULOSKELETAL:  No edema; No deformity  ?SKIN: Warm and dry ?NEUROLOGIC:  Alert and oriented x 3 ?PSYCHIATRIC:  Normal affect  ? ? ?EKGs/Labs/Other Studies Reviewed:   ? ?The following studies were reviewed today: ?*** ? ?EKG:  EKG is *** ordered today.  The ekg ordered today demonstrates *** ? ?Recent Labs: ?01/01/2021: Magnesium 1.9 ?01/08/2021: ALT 18; BUN 21; Creat 1.06; Potassium 5.0; Sodium 137 ?02/05/2021: Hemoglobin 11.8; Platelets 197  ?Recent Lipid Panel ?   ?Component  Value Date/Time  ? CHOL 132 06/15/2020 0938  ? CHOL 122 09/22/2016 0912  ? TRIG 111 12/27/2020 2155  ? HDL 42.60 06/15/2020 0938  ? HDL 39 (L) 09/22/2016 0912  ? CHOLHDL 3 06/15/2020 0938  ? VLDL 20.2 06/15/2020 0938  ? [place] 69 06/15/2020 0938  ? [place] 63 09/22/2016 0912  ? ? ?ASSESSMENT & PLAN  ? ? ?*** ? ? ?{Are you ordering a CV Procedure (e.g. stress test, cath, DCCV, TEE, etc)?   Press F2        :[id number]}  ? ? ?Medication Adjustments/Labs and Tests Ordered: ?Current medicines are reviewed at length with the patient today.  Concerns regarding medicines are outlined above.  ?No orders of the defined types were placed in this encounter. ? ?No orders of the defined types were placed in this encounter. ? ? ?There are no Patient Instructions on file for this visit.  ? ?Signed, ?Deberah Pelton, NP  ?09/16/2021 9:32 AM    ? ? ?Notice: This dictation was prepared with Dragon dictation along with smaller phrase technology. Any transcriptional errors that result from this process are unintentional and may not be corrected upon review. ? ?I spent***minutes examining this patient, reviewing medications, and using patient centered shared decision making involving her cardiac care.  Prior to her visit I spent greater  than 20 minutes reviewing her past medical history,  medications, and prior cardiac tests. ? ?

## 2021-09-16 NOTE — Assessment & Plan Note (Signed)
Finding on most recent chest x-ray.  Patient was placed on furosemide 20 mg daily.  He has been taking medication as prescribed.  Does not notice a huge difference per his report.  No lower edema.  Pending lab work ?

## 2021-09-16 NOTE — Patient Instructions (Signed)
Nice to see you today ?I will be in touch with the labs  ?Continue taking the antibiotics. If A1C is ok we may do a short course of steroids ?Follow up with cardiology as scheduled. Also follow up in 1 month for a repeat chest xray ?

## 2021-09-17 LAB — CBC WITH DIFFERENTIAL/PLATELET
Basophils Absolute: 0.1 10*3/uL (ref 0.0–0.1)
Basophils Relative: 0.9 % (ref 0.0–3.0)
Eosinophils Absolute: 0.3 10*3/uL (ref 0.0–0.7)
Eosinophils Relative: 3.3 % (ref 0.0–5.0)
HCT: 40.8 % (ref 39.0–52.0)
Hemoglobin: 13.4 g/dL (ref 13.0–17.0)
Lymphocytes Relative: 21.9 % (ref 12.0–46.0)
Lymphs Abs: 2 10*3/uL (ref 0.7–4.0)
MCHC: 32.9 g/dL (ref 30.0–36.0)
MCV: 87.1 fl (ref 78.0–100.0)
Monocytes Absolute: 0.8 10*3/uL (ref 0.1–1.0)
Monocytes Relative: 9.2 % (ref 3.0–12.0)
Neutro Abs: 5.9 10*3/uL (ref 1.4–7.7)
Neutrophils Relative %: 64.7 % (ref 43.0–77.0)
Platelets: 402 10*3/uL — ABNORMAL HIGH (ref 150.0–400.0)
RBC: 4.69 Mil/uL (ref 4.22–5.81)
RDW: 13.5 % (ref 11.5–15.5)
WBC: 9.1 10*3/uL (ref 4.0–10.5)

## 2021-09-17 LAB — BASIC METABOLIC PANEL
BUN: 21 mg/dL (ref 6–23)
CO2: 27 mEq/L (ref 19–32)
Calcium: 9.4 mg/dL (ref 8.4–10.5)
Chloride: 104 mEq/L (ref 96–112)
Creatinine, Ser: 0.88 mg/dL (ref 0.40–1.50)
GFR: 78.84 mL/min (ref >60.00)
Glucose, Bld: 92 mg/dL (ref 70–99)
Potassium: 4 mEq/L (ref 3.5–5.1)
Sodium: 140 mEq/L (ref 135–145)

## 2021-09-17 LAB — BRAIN NATRIURETIC PEPTIDE: Pro B Natriuretic peptide (BNP): 39 pg/mL (ref 0.0–100.0)

## 2021-09-17 LAB — HEMOGLOBIN A1C: Hgb A1c MFr Bld: 7.1 % — ABNORMAL HIGH (ref 4.6–6.5)

## 2021-09-17 NOTE — Progress Notes (Signed)
All looks good  ?Bnp not elevated ?Plts very slight increase, likely r/t infection/inflammation  ?Sending for your review as well Matt.

## 2021-09-21 ENCOUNTER — Other Ambulatory Visit: Payer: Self-pay | Admitting: Cardiovascular Disease

## 2021-09-21 ENCOUNTER — Other Ambulatory Visit: Payer: Self-pay

## 2021-09-21 ENCOUNTER — Ambulatory Visit: Payer: Medicare HMO | Admitting: Cardiovascular Disease

## 2021-09-21 ENCOUNTER — Encounter: Payer: Self-pay | Admitting: Cardiovascular Disease

## 2021-09-21 ENCOUNTER — Ambulatory Visit (HOSPITAL_BASED_OUTPATIENT_CLINIC_OR_DEPARTMENT_OTHER): Payer: Medicare HMO | Admitting: General Practice

## 2021-09-21 VITALS — BP 116/62 | HR 96 | Ht 67.0 in | Wt 171.0 lb

## 2021-09-21 DIAGNOSIS — I480 Paroxysmal atrial fibrillation: Secondary | ICD-10-CM | POA: Diagnosis not present

## 2021-09-21 DIAGNOSIS — E785 Hyperlipidemia, unspecified: Secondary | ICD-10-CM | POA: Diagnosis not present

## 2021-09-21 DIAGNOSIS — I2510 Atherosclerotic heart disease of native coronary artery without angina pectoris: Secondary | ICD-10-CM | POA: Diagnosis not present

## 2021-09-21 DIAGNOSIS — I10 Essential (primary) hypertension: Secondary | ICD-10-CM

## 2021-09-21 NOTE — Patient Instructions (Signed)
Medication Instructions:  ? ?Your physician recommends that you continue on your current medications as directed. Please refer to the Current Medication list given to you today. ? ? ?*If you need a refill on your cardiac medications before your next appointment, please call your pharmacy* ? ? ?Lab Work: [place] ? ? ? ?If you have labs (blood work) drawn today and your tests are completely normal, you will receive your results only by: ?MyChart Message (if you have MyChart) OR ?A paper copy in the mail ?If you have any lab test that is abnormal or we need to change your treatment, we will call you to review the results. ? ? ?Testing/Procedures: NONE ORDERED  TODAY ? ? ? ? ? ?Follow-Up: ?At [facility], you and your health needs are our priority.  As part of our continuing mission to provide you with exceptional heart care, we have created designated Provider Care Teams.  These Care Teams include your primary Cardiologist (physician) and Advanced Practice Providers (APPs -  Physician Assistants and Nurse Practitioners) who all work together to provide you with the care you need, when you need it. ? ?We recommend signing up for the patient portal called "MyChart".  Sign up information is provided on this After Visit Summary.  MyChart is used to connect with patients for Virtual Visits (Telemedicine).  Patients are able to view lab/test results, encounter notes, upcoming appointments, etc.  Non-urgent messages can be sent to your provider as well.   ?To learn more about what you can do with MyChart, go to [email or web address].   ? ?Your next appointment:   ?1 year(s) ? ?The format for your next appointment:   ?In Person ? ?Provider:   ?Lauree Chandler, MD   ? ? ?Other Instructions ? ?

## 2021-09-21 NOTE — Progress Notes (Signed)
? ? ?Chief Complaint  ?Patient presents with  ? Follow-up  ?  CAD  ? ?History of Present Illness: 87 yo male with history of CAD s/p CABG 2016, prior coronary stent placement before his CABG, HTN, hyperlipidemia, paroxysmal atrial fibrillation and GERD who presents today for cardiac follow up. He has been followed for CAD since 2005 when he had a LAD stent placed. He was evaluated April 2016 for chest pain. Cardiac catheterization April 2016 with multivessel disease with severe 95% distal left main stenosis.  He underwent 4V CABG (LIMA-LAD, SVG-DX, SVG-PDA, SVG-OM) April 2016. Echo April 2017 with normal LV function and no significant valvular disease. He was seen in July 2019 for chest pain, palpitations and uncontrolled blood pressure. Nuclear stress test July 2019 with no ischemia. Echo July 2019 with LVEF=60-65% with grade 1 diastolic dysfunction. Mild MR. Cardiac monitor July 2019 with type 2 second degree AV block and junctional bradycardia with frequent PACs. HIs beta blocker was stopped. He felt better after stopping the beta blocker. He had trouble speaking in December 2020 and EMS strips showed sinus with PACs.  EKG in ED with sinus. Echo February 2021 with LVEF=60-65%. Mild LVH. No significant valve disease. Cardiac monitor March 2021 with Sinus, Mobitz 1 block, no atrial fib. He was admitted to [facility] in June 2022 with Covid pneumonia. He had atrial fib while admitted. Plavix was stopped and Eliquis was started. Echo June 2022 with LVEF=70-75%, moderate LVH, normal RV function. Trivial MR. Trivial AI.  ? ?He is here today for follow up. The patient denies any chest pain, dyspnea, palpitations, lower extremity edema, orthopnea, PND, dizziness, near syncope or syncope.  ? ?Primary Care Physician: Jinny Sanders, MD ? ?Past Medical History:  ?Diagnosis Date  ? CAD (coronary artery disease)   ? s/p stent mid LAD 2005 // s/p CABG 2016 // Myoview 8/19:  EF 64, no ischemia  ? Coronary artery disease involving  native coronary artery of native heart without angina pectoris   ? Depression   ? DJD (degenerative joint disease)   ? DM type 2 with diabetic peripheral neuropathy ([place]) 03/10/2017  ? E coli bacteremia 04/20/2016  ? Essential hypertension 04/20/2016  ? GERD (gastroesophageal reflux disease)   ? History of echocardiogram   ? Echo 8/19: Moderate LVH, EF 60-65, normal wall motion, grade 1 diastolic dysfunction, mild MR, mild LAE, normal RVSF, mild TR, PASP 25  ? History of hiatal hernia   ? HTN (hypertension)   ? Hyperlipidemia   ? Neuropathy due to type 2 diabetes mellitus ([place]) 03/10/2017  ? PERCUTANEOUS TRANSLUMINAL CORONARY ANGIOPLASTY, HX OF 11/26/2007  ? Annotation: With CYPHER STENT. Qualifier: Diagnosis of  By: [employer], [place]    ? Pre-diabetes   ? S/P CABG x 4 11/03/2014  ? S/P total knee replacement 04/11/2016  ? Sepsis due to Escherichia coli (E. coli) ([place]) 04/20/2016  ? Tinea pedis 09/08/2017  ? ? ?Past Surgical History:  ?Procedure Laterality Date  ? BRAIN SURGERY    ? CARDIAC CATHETERIZATION    ? CORONARY ARTERY BYPASS GRAFT N/A 11/03/2014  ? Procedure: CORONARY ARTERY BYPASS GRAFTING (CABG), ON PUMP, TIMES FOUR, USING LEFT INTERNAL MAMMARY, RIGHT GREATER SAPHENOUS VEIN HARVESTED ENDOSCOPICALLY;  Surgeon: Ivin Poot, MD;  Location: [facility];  Service: Open Heart Surgery;  Laterality: N/A;  LIMA-LAD; SVG-DIAG; SVG-OM; SVG-RCA  ? CRANIOTOMY N/A 01/18/2014  ? Procedure: CRANIOTOMY HEMATOMA EVACUATION SUBDURAL;  Surgeon: Hosie Spangle, MD;  Location: [place];  Service: Neurosurgery;  Laterality: N/A;  ? kidney stone removal    ? KNEE SURGERY    ? LEFT HEART CATHETERIZATION WITH CORONARY ANGIOGRAM N/A 07/27/2011  ? Procedure: LEFT HEART CATHETERIZATION WITH CORONARY ANGIOGRAM;  Surgeon: Burnell Blanks, MD;  Location: [facility] CATH LAB;  Service: Cardiovascular;  Laterality: N/A;  ? LEFT HEART CATHETERIZATION WITH CORONARY ANGIOGRAM N/A 10/31/2014  ? Procedure: LEFT HEART CATHETERIZATION WITH CORONARY  ANGIOGRAM;  Surgeon: Sherren Mocha, MD;  Location: [facility] CATH LAB;  Service: Cardiovascular;  Laterality: N/A;  ? PTCA    ? Hx of it.   ? SIGMOIDOSCOPY    ? TEE WITHOUT CARDIOVERSION N/A 11/03/2014  ? Procedure: TRANSESOPHAGEAL ECHOCARDIOGRAM (TEE);  Surgeon: Ivin Poot, MD;  Location: [place];  Service: Open Heart Surgery;  Laterality: N/A;  ? TOTAL KNEE ARTHROPLASTY  02/2011  ? Right knee  ? TOTAL KNEE ARTHROPLASTY Left 04/11/2016  ? Procedure: LEFT TOTAL KNEE ARTHROPLASTY;  Surgeon: Vickey Huger, MD;  Location: [place];  Service: Orthopedics;  Laterality: Left;  ? ? ?Current Outpatient Medications  ?Medication Sig Dispense Refill  ? acetaminophen (TYLENOL) 500 MG tablet Take 500-1,000 mg by mouth 2 (two) times daily as needed (for pain).     ? albuterol (VENTOLIN HFA) 108 (90 Base) MCG/ACT inhaler Inhale 2 puffs into the lungs every 6 (six) hours as needed for wheezing or shortness of breath. 18 g 0  ? amLODipine (NORVASC) 5 MG tablet Take 1 tablet by mouth once daily 90 tablet 2  ? amoxicillin-clavulanate (AUGMENTIN) 875-125 MG tablet Take 1 tablet by mouth 2 (two) times daily. 20 tablet 0  ? cholecalciferol (VITAMIN D3) 25 MCG (1000 UNIT) tablet Take 1,000 Units by mouth daily.    ? doxycycline (VIBRA-TABS) 100 MG tablet Take 1 tablet (100 mg total) by mouth 2 (two) times daily for 10 days. 20 tablet 0  ? ELIQUIS 5 MG TABS tablet Take 1 tablet by mouth twice daily 180 tablet 0  ? furosemide (LASIX) 20 MG tablet Take 1 tablet (20 mg total) by mouth daily. 30 tablet 3  ? isosorbide mononitrate (IMDUR) 30 MG 24 hr tablet Take 1 tablet by mouth once daily 90 tablet 2  ? lisinopril (ZESTRIL) 40 MG tablet Take 1 tablet by mouth once daily 90 tablet 2  ? nitroGLYCERIN (NITROSTAT) 0.4 MG SL tablet Place 1 tablet (0.4 mg total) under the tongue every 5 (five) minutes as needed for chest pain. 25 tablet 3  ? pantoprazole (PROTONIX) 40 MG tablet Take 1 tablet by mouth once daily 90 tablet 0  ? rosuvastatin (CRESTOR) 5 MG  tablet Take 1 tablet by mouth once daily 90 tablet 1  ? vitamin B-12 (CYANOCOBALAMIN) 1000 MCG tablet Take 1,000 mcg by mouth daily.    ? ?No current facility-administered medications for this visit.  ? ? ?Allergies  ?Allergen Reactions  ? Protamine Rash  ?  Hypotension, Rash, unstable vitals.  ?  ? Statins Other (See Comments)  ?  Caused soreness (Lipitor, Zocor)  ? Adhesive [Tape] Hives  ? Imodium [Loperamide] Itching and Rash  ? Rosuvastatin Calcium Other (See Comments)  ?  Caused soreness (Lipitor, Zocor) ?12/28/2020 Pt can tolerate low dose  ? ? ?Social History  ? ?Socioeconomic History  ? Marital status: Married  ?  Spouse name: Not on file  ? Number of children: Not on file  ? Years of education: Not on file  ? Highest education level: Not on file  ?Occupational History  ? Not on file  ?  Tobacco Use  ? Smoking status: Former  ? Smokeless tobacco: Former  ? Tobacco comments:  ?  quit in 1985. smoked 1ppd for 35 years   ?Vaping Use  ? Vaping Use: Never used  ?Substance and Sexual Activity  ? Alcohol use: No  ? Drug use: No  ? Sexual activity: Never  ?Other Topics Concern  ? Not on file  ?Social History Narrative  ? Married with children. 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 Has living will,  HCPOA: jerrie Sherrod, full code (reviewed 2015)  ? ?Social Determinants of Health  ? ?Financial Resource Strain: Low Risk   ? Difficulty of Paying Living Expenses: Not hard at all  ?Food Insecurity: No Food Insecurity  ? Worried About Charity fundraiser in the Last Year: Never true  ? Ran Out of Food in the Last Year: Never true  ?Transportation Needs: No Transportation Needs  ? Lack of Transportation (Medical): No  ? Lack of Transportation (Non-Medical): No  ?Physical Activity: Inactive  ? Days of Exercise per Week: 0 days  ? Minutes of Exercise per Session: 0 min  ?Stress: No Stress Concern Present  ? Feeling of Stress : Not at all  ?Social Connections: Socially  Integrated  ? Frequency of Communication with Friends and Family: More than three times a week  ? Frequency of Social Gatherings with Friends and Family: Three times a week  ? Attends Religious Services: More than 4 tim

## 2021-09-28 ENCOUNTER — Encounter: Payer: Medicare HMO | Admitting: Family Medicine

## 2021-10-20 ENCOUNTER — Other Ambulatory Visit: Payer: Self-pay | Admitting: Family Medicine

## 2021-11-01 ENCOUNTER — Telehealth: Payer: Self-pay | Admitting: Family Medicine

## 2021-11-01 DIAGNOSIS — E1142 Type 2 diabetes mellitus with diabetic polyneuropathy: Secondary | ICD-10-CM

## 2021-11-01 NOTE — Telephone Encounter (Signed)
-----   Message from Velna Hatchet, RT sent at 11/01/2021  9:22 AM EDT ----- ?Regarding: Lab Tue 11/16/21 ?Patient is scheduled for cpx, please order future labs.  Thanks, Anda Kraft  ? ?

## 2021-11-09 ENCOUNTER — Telehealth: Payer: Self-pay

## 2021-11-14 ENCOUNTER — Other Ambulatory Visit: Payer: Self-pay | Admitting: Family Medicine

## 2021-11-15 NOTE — Chronic Care Management (AMB) (Signed)
    Chronic Care Management Pharmacy Assistant   Name: [name]  MRN: [id number] DOB: 04-26-1935  Reason for Encounter:Cholesterol Disease State    Recent office visits:  09/16/21-Craig Cable,NP(fam.med)-F/U pneumonia,finish up antibiotics and repeat chest xray.Pending labs  Recent consult visits:  09/21/21-Craig McAlhany,MD(cardio)- F/U CAD-no medication changes  Hospital visits:  None in previous 6 months  Medications: Outpatient Encounter Medications as of 11/09/2021  Medication Sig   acetaminophen (TYLENOL) 500 MG tablet Take 500-1,000 mg by mouth 2 (two) times daily as needed (for pain).    albuterol (VENTOLIN HFA) 108 (90 Base) MCG/ACT inhaler Inhale 2 puffs into the lungs every 6 (six) hours as needed for wheezing or shortness of breath.   amLODipine (NORVASC) 5 MG tablet Take 1 tablet by mouth once daily   amoxicillin-clavulanate (AUGMENTIN) 875-125 MG tablet Take 1 tablet by mouth 2 (two) times daily.   cholecalciferol (VITAMIN D3) 25 MCG (1000 UNIT) tablet Take 1,000 Units by mouth daily.   ELIQUIS 5 MG TABS tablet Take 1 tablet by mouth twice daily   furosemide (LASIX) 20 MG tablet Take 1 tablet (20 mg total) by mouth daily.   isosorbide mononitrate (IMDUR) 30 MG 24 hr tablet Take 1 tablet by mouth once daily   lisinopril (ZESTRIL) 40 MG tablet Take 1 tablet by mouth once daily   nitroGLYCERIN (NITROSTAT) 0.4 MG SL tablet Place 1 tablet (0.4 mg total) under the tongue every 5 (five) minutes as needed for chest pain.   pantoprazole (PROTONIX) 40 MG tablet Take 1 tablet by mouth once daily   vitamin B-12 (CYANOCOBALAMIN) 1000 MCG tablet Take 1,000 mcg by mouth daily.   [DISCONTINUED] rosuvastatin (CRESTOR) 5 MG tablet Take 1 tablet by mouth once daily   No facility-administered encounter medications on file as of 11/09/2021.   11/15/2021 Name: [name] MRN: [id number] DOB: [birth year] [name] is a 87 y.o. year old male who is a primary care patient of Howard,  Craig E, MD.  Comprehensive medication review performed; Spoke to patient regarding cholesterol.   Lipid Panel    Component Value Date/Time   CHOL 132 06/15/2020 0938   CHOL 122 09/22/2016 0912   TRIG 111 12/27/2020 2155   HDL 42.60 06/15/2020 0938   HDL 39 (L) 09/22/2016 0912   LDLCALC 69 06/15/2020 0938   LDLCALC 63 09/22/2016 0912    10-year ASCVD risk score: The ASCVD Risk score (Arnett DK, et al., 2019) failed to calculate for the following reasons:   The 2019 ASCVD risk score is only valid for ages 72 to 22   Attempted contact with [name] 3 times on 11/15/21,11/16/21,11/18/21. Unsuccessful outreach. Will attempt contact next month.   Current antihyperlipidemic regimen:  Rosuvastatin 5 mg - 1 tablet daily Previous antihyperlipidemic medications tried:  none  ASCVD risk enhancing conditions: age >22, DM, and HTN   Annual wellness visit in last year? Yes Most Recent BP reading:116/62  96-P  09/21/21  If Diabetic: Most recent A1C reading:7.1  09/16/21 Last eye exam / retinopathy screening:2021 Last diabetic foot exam:2021  Upcoming appointments: PCP appointment on 11/26/21 and CCM appointment on 05/23/22  [facility] Medication Name/Dose: Last fill date Days supply Rosuvastatin '5mg'$   07/19/21  90 Lisinopril '40mg'$   11/14/21  [name], [name] notified  [name], [place]  [phone]

## 2021-11-16 ENCOUNTER — Other Ambulatory Visit (INDEPENDENT_AMBULATORY_CARE_PROVIDER_SITE_OTHER): Payer: Medicare HMO

## 2021-11-16 ENCOUNTER — Ambulatory Visit (INDEPENDENT_AMBULATORY_CARE_PROVIDER_SITE_OTHER)
Admission: RE | Admit: 2021-11-16 | Discharge: 2021-11-16 | Disposition: A | Discharge status: Home / Self Care | Payer: Medicare HMO | Source: Ambulatory Visit | Attending: Nurse Practitioner | Admitting: Nurse Practitioner

## 2021-11-16 DIAGNOSIS — J189 Pneumonia, unspecified organism: Secondary | ICD-10-CM

## 2021-11-16 DIAGNOSIS — J90 Pleural effusion, not elsewhere classified: Secondary | ICD-10-CM | POA: Diagnosis not present

## 2021-11-16 DIAGNOSIS — E1142 Type 2 diabetes mellitus with diabetic polyneuropathy: Secondary | ICD-10-CM

## 2021-11-16 LAB — COMPREHENSIVE METABOLIC PANEL
ALT: 17 U/L (ref 0–53)
AST: 16 U/L (ref 0–37)
Albumin: 4.2 g/dL (ref 3.5–5.2)
Alkaline Phosphatase: 55 U/L (ref 39–117)
BUN: 20 mg/dL (ref 6–23)
CO2: 26 mEq/L (ref 19–32)
Calcium: 8.9 mg/dL (ref 8.4–10.5)
Chloride: 108 mEq/L (ref 96–112)
Creatinine, Ser: 0.79 mg/dL (ref 0.40–1.50)
GFR: 81.36 mL/min (ref >60.00)
Glucose, Bld: 115 mg/dL — ABNORMAL HIGH (ref 70–99)
Potassium: 3.3 mEq/L — ABNORMAL LOW (ref 3.5–5.1)
Sodium: 144 mEq/L (ref 135–145)
Total Bilirubin: 0.6 mg/dL (ref 0.2–1.2)
Total Protein: 6.6 g/dL (ref 6.0–8.3)

## 2021-11-16 LAB — HEMOGLOBIN A1C: Hgb A1c MFr Bld: 6.4 % (ref 4.6–6.5)

## 2021-11-16 LAB — LIPID PANEL
Cholesterol: 143 mg/dL (ref 0–200)
HDL: 39.1 mg/dL (ref >39.00)
LDL Cholesterol: 92 mg/dL (ref 0–99)
NonHDL: 104.37
Total CHOL/HDL Ratio: 4
Triglycerides: 64 mg/dL (ref 0.0–149.0)
VLDL: 12.8 mg/dL (ref 0.0–40.0)

## 2021-11-19 ENCOUNTER — Other Ambulatory Visit: Payer: Self-pay | Admitting: Family Medicine

## 2021-11-19 ENCOUNTER — Telehealth: Payer: Self-pay

## 2021-11-19 NOTE — Telephone Encounter (Signed)
I left a message for the patient to return my call. For lab results ?

## 2021-11-19 NOTE — Telephone Encounter (Signed)
Spoke with Mr. Mullendore and discussed his potassium level.  See result note.  ?

## 2021-11-19 NOTE — Progress Notes (Signed)
Reviewed chest x-ray and agree with plan. ?

## 2021-11-19 NOTE — Telephone Encounter (Signed)
Pt was calling [name] back about his lab results. Please advise ? ?Callback Number: [phone] ?

## 2021-11-26 ENCOUNTER — Encounter: Payer: Self-pay | Admitting: Family Medicine

## 2021-11-26 ENCOUNTER — Ambulatory Visit (INDEPENDENT_AMBULATORY_CARE_PROVIDER_SITE_OTHER): Payer: Medicare HMO | Admitting: Family Medicine

## 2021-11-26 VITALS — BP 138/80 | HR 75 | Ht 67.0 in | Wt 178.1 lb

## 2021-11-26 DIAGNOSIS — I480 Paroxysmal atrial fibrillation: Secondary | ICD-10-CM

## 2021-11-26 DIAGNOSIS — E1159 Type 2 diabetes mellitus with other circulatory complications: Secondary | ICD-10-CM

## 2021-11-26 DIAGNOSIS — E785 Hyperlipidemia, unspecified: Secondary | ICD-10-CM | POA: Diagnosis not present

## 2021-11-26 DIAGNOSIS — I152 Hypertension secondary to endocrine disorders: Secondary | ICD-10-CM

## 2021-11-26 DIAGNOSIS — Z0000 Encounter for general adult medical examination without abnormal findings: Secondary | ICD-10-CM | POA: Diagnosis not present

## 2021-11-26 DIAGNOSIS — E1142 Type 2 diabetes mellitus with diabetic polyneuropathy: Secondary | ICD-10-CM

## 2021-11-26 DIAGNOSIS — E1169 Type 2 diabetes mellitus with other specified complication: Secondary | ICD-10-CM

## 2021-11-26 DIAGNOSIS — E1140 Type 2 diabetes mellitus with diabetic neuropathy, unspecified: Secondary | ICD-10-CM

## 2021-11-26 NOTE — Progress Notes (Signed)
5 mg daily.   Patient ID: [name], male    DOB: 12/01/1934, 87 y.o.   MRN: [id number]  This visit was conducted in person.  BP 138/80   Pulse 75   Ht '5\' 7"'$  (1.702 m)   Wt 178 lb 1.6 oz (80.8 kg)   SpO2 95%   BMI 27.89 kg/m    CC:  Chief Complaint  Patient presents with   medicare visit    Subjective:   HPI: [name] is a 87 y.o. male presenting on 11/26/2021 for medicare visit  The patient presents for annual medicare wellness, complete physical and review of chronic health problems. He/She also has the following acute concerns today:  I have personally reviewed the Medicare Annual Wellness questionnaire and have noted 1. The patient's medical and social history 2. Their use of alcohol, tobacco or illicit drugs 3. Their current medications and supplements 4. The patient's functional ability including ADL's, fall risks, home safety risks and hearing or visual             impairment. 5. Diet and physical activities 6. Evidence for depression or mood disorders 7.         Updated provider listCognitive evaluation was performed and recorded on pt medicare questionnaire form. The patients weight, height, BMI and visual acuity have been recorded in the chart   I have made referrals, counseling and provided education to the patient based review of the above and I have provided the pt with a written personalized care plan for preventive services.   Documentation of this information was scanned into the electronic record under the media tab.   Advance directives and end of life planning reviewed in detail with patient and documented in EMR. Patient given handout on advance care directives if needed. HCPOA and living will updated if needed.  No falls in last 12 months.  Vision Screening   Right eye Left eye Both eyes  Without correction     With correction '20/30 20/30 20/30 '$  Hearing Screening - Comments:: Pt is wearing hear aid   [place] Office Visit from  11/26/2021 in [place] at [facility] Total Score 0      Diabetes:   Diet controlled. Lab Results  Component Value Date   HGBA1C 6.4 11/16/2021  Using medications without difficulties: Hypoglycemic episodes: Hyperglycemic episodes: Feet problems: no uclers Blood Sugars averaging: eye exam within last year:  Elevated Cholesterol: LDL at goal < 100 on crestor Lab Results  Component Value Date   CHOL 143 11/16/2021   HDL 39.10 11/16/2021   LDLCALC 92 11/16/2021   TRIG 64.0 11/16/2021   CHOLHDL 4 11/16/2021  Using medications without problems: Muscle aches:  Diet compliance: Exercise: trying to increase walking, gardening a lot. Other complaints:  Hypertension:    Well controlled in office today on lisinopril 40 mg daily, amlodipine 5 mg  BP Readings from Last 3 Encounters:  11/26/21 138/80  09/21/21 116/62  09/16/21 110/64  Using medication without problems or lightheadedness:  none Chest pain with exertion: none Edema: Short of breath: Improved since PNA last year. Average home BPs:  120/70 Other issues:   AF:   Followed by cardiology. Dr. Glennie Hawk  On eliquis anticoagulation.  PNA last year.. CXR 3/23 , then had a repeat 11/2021 stable left pleural effusion.. told to use furosemide but he has not yet.  He reports stable SOb only with exertion.         Relevant past  medical, surgical, family and social history reviewed and updated as indicated. Interim medical history since our last visit reviewed. Allergies and medications reviewed and updated. Outpatient Medications Prior to Visit  Medication Sig Dispense Refill   acetaminophen (TYLENOL) 500 MG tablet Take 500-1,000 mg by mouth 2 (two) times daily as needed (for pain).      albuterol (VENTOLIN HFA) 108 (90 Base) MCG/ACT inhaler Inhale 2 puffs into the lungs every 6 (six) hours as needed for wheezing or shortness of breath. 18 g 0   amLODipine (NORVASC) 5 MG tablet Take 1 tablet by mouth once  daily 90 tablet 2   amoxicillin-clavulanate (AUGMENTIN) 875-125 MG tablet Take 1 tablet by mouth 2 (two) times daily. 20 tablet 0   cholecalciferol (VITAMIN D3) 25 MCG (1000 UNIT) tablet Take 1,000 Units by mouth daily.     ELIQUIS 5 MG TABS tablet Take 1 tablet by mouth twice daily 180 tablet 0   furosemide (LASIX) 20 MG tablet Take 1 tablet (20 mg total) by mouth daily. 30 tablet 3   isosorbide mononitrate (IMDUR) 30 MG 24 hr tablet Take 1 tablet by mouth once daily 90 tablet 2   lisinopril (ZESTRIL) 40 MG tablet Take 1 tablet by mouth once daily 90 tablet 2   nitroGLYCERIN (NITROSTAT) 0.4 MG SL tablet Place 1 tablet (0.4 mg total) under the tongue every 5 (five) minutes as needed for chest pain. 25 tablet 3   pantoprazole (PROTONIX) 40 MG tablet Take 1 tablet by mouth once daily 90 tablet 0   rosuvastatin (CRESTOR) 5 MG tablet Take 1 tablet by mouth once daily 90 tablet 0   vitamin B-12 (CYANOCOBALAMIN) 1000 MCG tablet Take 1,000 mcg by mouth daily.     No facility-administered medications prior to visit.     Per HPI unless specifically indicated in ROS section below Review of Systems  Constitutional:  Negative for fatigue and fever.  HENT:  Negative for ear pain.   Eyes:  Negative for pain.  Respiratory:  Positive for shortness of breath. Negative for cough.   Cardiovascular:  Negative for chest pain, palpitations and leg swelling.  Gastrointestinal:  Negative for abdominal pain.  Genitourinary:  Negative for dysuria.  Musculoskeletal:  Negative for arthralgias.  Neurological:  Negative for syncope, light-headedness and headaches.  Psychiatric/Behavioral:  Negative for dysphoric mood.    Objective:  BP 138/80   Pulse 75   Ht '5\' 7"'$  (1.702 m)   Wt 178 lb 1.6 oz (80.8 kg)   SpO2 95%   BMI 27.89 kg/m   Wt Readings from Last 3 Encounters:  11/26/21 178 lb 1.6 oz (80.8 kg)  09/21/21 171 lb (77.6 kg)  09/16/21 168 lb (76.2 kg)      Physical Exam Constitutional:       Appearance: He is well-developed.  HENT:     Head: Normocephalic.     Right Ear: Hearing normal.     Left Ear: Hearing normal.     Nose: Nose normal.  Neck:     Thyroid: No thyroid mass or thyromegaly.     Vascular: No carotid bruit.     Trachea: Trachea normal.  Cardiovascular:     Rate and Rhythm: Normal rate and regular rhythm.     Pulses: Normal pulses.     Heart sounds: Heart sounds not distant. No murmur heard.    No friction rub. No gallop.     Comments: No peripheral edema Pulmonary:     Effort: Pulmonary effort is normal.  No respiratory distress.     Breath sounds: Normal breath sounds.  Skin:    General: Skin is warm and dry.     Findings: No rash.  Psychiatric:        Speech: Speech normal.        Behavior: Behavior normal.        Thought Content: Thought content normal.       Diabetic foot exam: Normal inspection No skin breakdown No calluses  Normal DP pulses Normal sensation to light touch and monofilament Nails normal  Results for orders placed or performed in visit on 11/16/21  Comprehensive metabolic panel  Result Value Ref Range   Sodium 144 135 - 145 mEq/L   Potassium 3.3 (L) 3.5 - 5.1 mEq/L   Chloride 108 96 - 112 mEq/L   CO2 26 19 - 32 mEq/L   Glucose, Bld 115 (H) 70 - 99 mg/dL   BUN 20 6 - 23 mg/dL   Creatinine, Ser 0.79 0.40 - 1.50 mg/dL   Total Bilirubin 0.6 0.2 - 1.2 mg/dL   Alkaline Phosphatase 55 39 - 117 U/L   AST 16 0 - 37 U/L   ALT 17 0 - 53 U/L   Total Protein 6.6 6.0 - 8.3 g/dL   Albumin 4.2 3.5 - 5.2 g/dL   GFR 81.36 >60.00 mL/min   Calcium 8.9 8.4 - 10.5 mg/dL  Lipid panel  Result Value Ref Range   Cholesterol 143 0 - 200 mg/dL   Triglycerides 64.0 0.0 - 149.0 mg/dL   HDL 39.10 >39.00 mg/dL   VLDL 12.8 0.0 - 40.0 mg/dL   LDL Cholesterol 92 0 - 99 mg/dL   Total CHOL/HDL Ratio 4    NonHDL 104.37   Hemoglobin A1c  Result Value Ref Range   Hgb A1c MFr Bld 6.4 4.6 - 6.5 %     COVID 19 screen:  No recent travel or known  exposure to COVID19 The patient denies respiratory symptoms of COVID 19 at this time. The importance of social distancing was discussed today.   Assessment and Plan   The patient's preventative maintenance and recommended screening tests for an annual wellness exam were reviewed in full today. Brought up to date unless services declined.  Counselled on the importance of diet, exercise, and its role in overall health and mortality. The patient's FH and SH was reviewed, including their home life, tobacco status, and drug and alcohol status.   Vaccines: UPTODATE with  Tdap, flu,  Due for COVID booster  colonoscopy not indicated  former smoker, remote  Problem List Items Addressed This Visit     DM type 2 with diabetic peripheral neuropathy (HCC) (Chronic)    Chronic, diet controlled      Hyperlipidemia associated with type 2 diabetes mellitus (HCC) (Chronic)    Chronic, LDL at goal less than 100 on Crestor 5 mg daily      Hypertension associated with diabetes (HCC) (Chronic)    Stable, chronic.  Continue current medication.    lisinopril 40 mg daily, amlodipine 5 mg daily      Neuropathy due to type 2 diabetes mellitus (HCC) (Chronic)    Due to diabetes, stable control on no medication      PAF (paroxysmal atrial fibrillation) (HCC)    On Eliquis anticoagulation. Followed by Dr. Angelena Form, cardiology.      Other Visit Diagnoses     Routine general medical examination at a health care facility    -  Primary  Kadisha Goodine, MD   

## 2021-11-26 NOTE — Patient Instructions (Addendum)
Take furosemide  for 1 week to see if dyspnea on exertion improves with treating pleural effusion. Increase potassium containing foods.  Can use Netty Pot for nasal irritation. Can also try flonase 2 sprays per nostril daily.

## 2021-12-10 ENCOUNTER — Telehealth: Payer: Self-pay

## 2021-12-10 NOTE — Chronic Care Management (AMB) (Signed)
Chronic Care Management Pharmacy Assistant   Name: [name]  MRN: [id number] DOB: 22-Jan-1935   Reason for Encounter: General Adherence     Recent office visits:  11/26/21-Amy Bedsole,MD(PCP)-AWV-Take furosemide  for 1 week to see if dyspnea on exertion improves with treating pleural effusion. Increase potassium containing foods.Can use Netty Pot for nasal irritation. Can also try flonase 2 sprays per nostril daily.f/u 6 months  Recent consult visits:  09/21/21-Christopher McAlhany,MD(cardio)-f/u CAD,EKG, no medication changes,f/u 12 months  Hospital visits:  None in previous 6 months  Medications: Outpatient Encounter Medications as of 12/10/2021  Medication Sig   acetaminophen (TYLENOL) 500 MG tablet Take 500-1,000 mg by mouth 2 (two) times daily as needed (for pain).    albuterol (VENTOLIN HFA) 108 (90 Base) MCG/ACT inhaler Inhale 2 puffs into the lungs every 6 (six) hours as needed for wheezing or shortness of breath.   amLODipine (NORVASC) 5 MG tablet Take 1 tablet by mouth once daily   cholecalciferol (VITAMIN D3) 25 MCG (1000 UNIT) tablet Take 1,000 Units by mouth daily.   ELIQUIS 5 MG TABS tablet Take 1 tablet by mouth twice daily   furosemide (LASIX) 20 MG tablet Take 1 tablet (20 mg total) by mouth daily.   isosorbide mononitrate (IMDUR) 30 MG 24 hr tablet Take 1 tablet by mouth once daily   lisinopril (ZESTRIL) 40 MG tablet Take 1 tablet by mouth once daily   nitroGLYCERIN (NITROSTAT) 0.4 MG SL tablet Place 1 tablet (0.4 mg total) under the tongue every 5 (five) minutes as needed for chest pain.   pantoprazole (PROTONIX) 40 MG tablet Take 1 tablet by mouth once daily   rosuvastatin (CRESTOR) 5 MG tablet Take 1 tablet by mouth once daily   vitamin B-12 (CYANOCOBALAMIN) 1000 MCG tablet Take 1,000 mcg by mouth daily.   No facility-administered encounter medications on file as of 12/10/2021.    Contacted [name] on 12/10/21 for general disease state and medication  adherence call.   Patient is not more than 5 days past due for refill on the following medications per chart history:  [facility] Medications: Medication Name/mg Last Fill Days Supply Rosuvastatin '5mg'$   11/15/21  90 Lisinopril 40 mg  11/14/21  90   What concerns do you have about your medications? No concerns at this time   The patient denies side effects with their medications.   How often do you forget or accidentally miss a dose? Never  Do you use a pillbox? Yes  the patient puts out 1 week at a time   Are you having any problems getting your medications from your pharmacy? No [facility] is good to the patient   Has the cost of your medications been a concern? No- Eliquis  is the most expensive medication for the patient. He will get a 90ds for cheaper price   Since last visit with CPP, no interventions have been made.   The patient has not had an ED visit since last contact.   The patient denies problems with their health.   Patient denies concerns or questions for Charlene Brooke, PharmD at this time.   Counseled patient on:  [place] job taking medications, Importance of taking medication daily without missed doses, Benefits of adherence packaging or a pillbox, and Access to CCM team for any cost, medication or pharmacy concerns.   Care Gaps: Annual wellness visit in last year? Yes Most Recent BP reading:138/80  75-5/19/23  If Diabetic: Most recent A1C reading:6.4  11/16/21  Last eye exam / retinopathy screening:2021 Last diabetic foot exam:2021  Upcoming appointments: CCM appointment on 05/23/22   Charlene Brooke, CPP notified  Avel Sensor, [place]  [phone]

## 2022-01-13 ENCOUNTER — Other Ambulatory Visit: Payer: Self-pay | Admitting: Family Medicine

## 2022-01-16 NOTE — Assessment & Plan Note (Signed)
Chronic, diet controlled. 

## 2022-01-16 NOTE — Assessment & Plan Note (Signed)
Stable, chronic.  Continue current medication.   lisinopril 40 mg daily , amlodipine 5 mg daily  

## 2022-01-16 NOTE — Assessment & Plan Note (Signed)
Due to diabetes, stable control on no medication

## 2022-01-16 NOTE — Assessment & Plan Note (Signed)
Chronic, LDL at goal less than 100 on Crestor 5 mg daily

## 2022-01-16 NOTE — Assessment & Plan Note (Addendum)
On Eliquis anticoagulation. Followed by Dr. Angelena Form, cardiology.

## 2022-02-11 ENCOUNTER — Telehealth: Payer: Self-pay

## 2022-02-11 NOTE — Chronic Care Management (AMB) (Signed)
Chronic Care Management Pharmacy Assistant   Name: [name]  MRN: [id number] DOB: 03-14-1935   Reason for Encounter:  Cholesterol Disease State    Recent office visits:  11/26/21-Amy Bedsole,MD(PCP)-AWV,discussed screenings,DM foot exam,UTD eye, LDL at goal < 100 on crestor,discussed vaccines, Take furosemide  for 1 week to see if dyspnea on exertion improves with treating pleural effusion.Increase potassium containing foods,use Netty Pot for nasal irritation. Can also try flonase 2 sprays per nostril daily.f/u 6 months 09/16/21-Kier Cable,MD(fam med)-f/u pneumonia,encouraged to complete antibiotics,labs pending( all looks good, Plts slight increase,likely l-infection/inflammation) 09/13/21-Tabitha Dugal,FNP(fam med)-Telemedicine-cough,start augmentin 875-'125mg'$ .2 times daily  Recent consult visits:  09/21/21-Christopher McAlhany,MD(cardio)-f/u CAD,EKG-no medication changes f/u 12 months  Hospital visits:  None in previous 6 months  Medications: Outpatient Encounter Medications as of 02/11/2022  Medication Sig   acetaminophen (TYLENOL) 500 MG tablet Take 500-1,000 mg by mouth 2 (two) times daily as needed (for pain).    albuterol (VENTOLIN HFA) 108 (90 Base) MCG/ACT inhaler Inhale 2 puffs into the lungs every 6 (six) hours as needed for wheezing or shortness of breath.   amLODipine (NORVASC) 5 MG tablet Take 1 tablet by mouth once daily   cholecalciferol (VITAMIN D3) 25 MCG (1000 UNIT) tablet Take 1,000 Units by mouth daily.   ELIQUIS 5 MG TABS tablet Take 1 tablet by mouth twice daily   furosemide (LASIX) 20 MG tablet Take 1 tablet (20 mg total) by mouth daily.   isosorbide mononitrate (IMDUR) 30 MG 24 hr tablet Take 1 tablet by mouth once daily   lisinopril (ZESTRIL) 40 MG tablet Take 1 tablet by mouth once daily   nitroGLYCERIN (NITROSTAT) 0.4 MG SL tablet Place 1 tablet (0.4 mg total) under the tongue every 5 (five) minutes as needed for chest pain.   pantoprazole (PROTONIX) 40  MG tablet Take 1 tablet by mouth once daily   rosuvastatin (CRESTOR) 5 MG tablet Take 1 tablet by mouth once daily   vitamin B-12 (CYANOCOBALAMIN) 1000 MCG tablet Take 1,000 mcg by mouth daily.   No facility-administered encounter medications on file as of 02/11/2022.   02/11/2022 Name: [name] MRN: [id number] DOB: 14-Jul-1934 [name] is a 87 y.o. year old male who is a primary care patient of Bedsole, Amy E, MD.  Comprehensive medication review performed; Spoke to patient regarding cholesterol.   Contacted patient on 02/17/22 to discuss hyperlipidemia.  Lipid Panel    Component Value Date/Time   CHOL 143 11/16/2021 0906   CHOL 122 09/22/2016 0912   TRIG 64.0 11/16/2021 0906   HDL 39.10 11/16/2021 0906   HDL 39 (L) 09/22/2016 0912   LDLCALC 92 11/16/2021 0906   LDLCALC 63 09/22/2016 0912    10-year ASCVD risk score: The ASCVD Risk score (Arnett DK, et al., 2019) failed to calculate for the following reasons:   The 2019 ASCVD risk score is only valid for ages 32 to 42  Current antihyperlipidemic regimen:   Rosuvastatin 5 mg - 1 tablet daily Patient is not taking currently, due to joint pain, he started red yeast rice again and not having pain   Previous antihyperlipidemic medications tried: none  ASCVD risk enhancing conditions: age >18, DM, and HTN  What recent interventions/DTPs have been made by any provider to improve Cholesterol control since last CPP Visit: Patient experienced some joint pain again and stopped taking rosuvastatin daily. He is back on the red yeast rice and not having joint pain. Labs in May.(Normal limits)  Any recent hospitalizations or  ED visits since last visit with CPP? No  What diet changes have been made to improve Cholesterol?   Home cooked meals daily,fresh vegetables, grilled meats, eats out on 'Sundays  What exercise is being done to improve Cholesterol?   Patient moved residence 3 weeks ago and is still moving in, stays busy  daily.Patient enjoys gardening, patient does not have sedentary lifestyle.  Adherence Review: Does the patient have >5 day gap between last estimated fill dates? No  Annual wellness visit in last year? Yes Most Recent BP reading:138/80  75 11/26/21  If Diabetic: Most recent A1C reading:7.1 Last eye exam / retinopathy  upcoming  September appointment  Last diabetic foot exam UTD  Upcoming appointments: CCM appointment on 05/22/22  [facility] Rating Drugs Medication Name/Dose: Last fill date Days supply Rosuvastatin 5mg                   11/15/21              90 Lisinopril 40mg                        11/14/21              90'$   [name], CPP notified  Avel Sensor, [place]  [phone]

## 2022-02-16 ENCOUNTER — Other Ambulatory Visit: Payer: Self-pay | Admitting: *Deleted

## 2022-02-16 MED ORDER — ROSUVASTATIN CALCIUM 5 MG PO TABS: 5.0000 mg | ORAL_TABLET | Freq: Every day | ORAL | 2 refills | Status: DC

## 2022-03-01 ENCOUNTER — Other Ambulatory Visit: Payer: Self-pay | Admitting: Family Medicine

## 2022-03-03 ENCOUNTER — Telehealth: Payer: Self-pay | Admitting: Cardiovascular Disease

## 2022-03-03 ENCOUNTER — Telehealth: Payer: Self-pay

## 2022-03-03 NOTE — Telephone Encounter (Signed)
He can check his formulary to see if Xarelto would be more affordable, but there is no generic equivalent for Eliquis. Alternatively he could use Coumadin for blood thinning purposes, but this requires weekly blood testing as well as medication and aggressive diet restrictions.

## 2022-03-03 NOTE — Telephone Encounter (Signed)
Mrs. Rawl notified as instructed by telephone.  Pharmacy did advise them that Mr. Macnair is in the doughnut hole.  They also stated he was on Plavix before being put on the Eliquis and wasn't sure if he could go back on that instead.   They will check with their insurance about Xarelto but I did advise that if they are in the doughnut hole, that medication may be expensive as well.  Coumadin would probably be the cheaper option but would require PT/INR checks in the office.  They will discuss this and let us know what they would like to do.  Looks they they have also put in a call, about this,  to his cardiologist office.  They may have other options.  FYI to Dr. Diona Browner.

## 2022-03-03 NOTE — Telephone Encounter (Signed)
Pt c/o medication issue:  1. Name of Medication: ELIQUIS 5 MG TABS tablet  2. How are you currently taking this medication (dosage and times per day)? Take 1 tablet by mouth twice daily  3. Are you having a reaction (difficulty breathing--STAT)? no  4. What is your medication issue? Patient is calling in because he cant afford the medication any more. Please advise

## 2022-03-03 NOTE — Telephone Encounter (Signed)
Patient called in stating the Eliquis is costing too much and wants to know if there is something else he can get.

## 2022-03-04 NOTE — Telephone Encounter (Signed)
**Note De-Identified Tishia Maestre Obfuscation** The pt states that he is in his donut hole. He refuses to take Warfarin and does not want to apply for assistance as he states "I dont see the use because Donnald Garre never been approved for anything in the past".  I gave him [employer] phone number and advised him to call them to see if he is eligible for approval into their Eliquis asst program and if so to request that they mail him an application to his address.  He is aware to complete his part of the application, obtain required documentation per [employer], and to bring all to Dr Camillia Herter office at [facility] and that we will take care of the providers page of his application and will fax all to [employer].  I advised him that if he does not apply for Eliquis asst or is denied and cannot afford Eliquis to call us back to discuss switching to another anticoagulant which will most likely be generic Warfarin.  He verbalized understanding and thanked me for calling him to discuss.

## 2022-03-10 NOTE — Telephone Encounter (Addendum)
Patient has stopped taking rosuvastatin due to joint aches and is taking red yeast rice instead. He reports PCP is aware that he sometimes does this.  Rosuvastatin adherence PDC: 78%, currently failing quality metric for statin adherence for the year. Filled 07/19/21 x 90 ds, 11/15/21 x 90 ds.  Scheduling PharmD CCM appt to discuss.

## 2022-03-17 DIAGNOSIS — H524 Presbyopia: Secondary | ICD-10-CM | POA: Diagnosis not present

## 2022-03-17 DIAGNOSIS — H52203 Unspecified astigmatism, bilateral: Secondary | ICD-10-CM | POA: Diagnosis not present

## 2022-03-17 DIAGNOSIS — H04123 Dry eye syndrome of bilateral lacrimal glands: Secondary | ICD-10-CM | POA: Diagnosis not present

## 2022-03-17 DIAGNOSIS — Z961 Presence of intraocular lens: Secondary | ICD-10-CM | POA: Diagnosis not present

## 2022-03-17 LAB — HM DIABETES EYE EXAM

## 2022-03-21 ENCOUNTER — Telehealth: Payer: Self-pay

## 2022-03-21 NOTE — Chronic Care Management (AMB) (Signed)
    Chronic Care Management Pharmacy Assistant   Name: [name]  MRN: [id number] DOB: 10-Feb-1935  Reason for Encounter: Reminder Call   Conditions to be addressed/monitored: CAD, HTN, HLD, and DMII   Recent office visits:  11/26/21-Amy Bedsole,MD(PCP)-AWV,discussed screenings, labs(A1c 6.4),vaccines, Take furosemide  for 1 week to see if dyspnea on exertion improves with treating pleural effusion.Increase potassium containing foods.  Can use Netty Pot for nasal irritation. Can also try flonase 2 sprays per nostril daily.f/u 6 months  Recent consult visits:  09/21/21-Christopher McAlhany,MD(cardio)-f/u CAD,EKG, no medication changes f/u 12 months  Hospital visits:  None in previous 6 months  Medications: Outpatient Encounter Medications as of 03/21/2022  Medication Sig   acetaminophen (TYLENOL) 500 MG tablet Take 500-1,000 mg by mouth 2 (two) times daily as needed (for pain).    albuterol (VENTOLIN HFA) 108 (90 Base) MCG/ACT inhaler Inhale 2 puffs into the lungs every 6 (six) hours as needed for wheezing or shortness of breath.   amLODipine (NORVASC) 5 MG tablet Take 1 tablet by mouth once daily   cholecalciferol (VITAMIN D3) 25 MCG (1000 UNIT) tablet Take 1,000 Units by mouth daily.   ELIQUIS 5 MG TABS tablet Take 1 tablet by mouth twice daily   furosemide (LASIX) 20 MG tablet Take 1 tablet (20 mg total) by mouth daily.   isosorbide mononitrate (IMDUR) 30 MG 24 hr tablet Take 1 tablet by mouth once daily   lisinopril (ZESTRIL) 40 MG tablet Take 1 tablet by mouth once daily   nitroGLYCERIN (NITROSTAT) 0.4 MG SL tablet Place 1 tablet (0.4 mg total) under the tongue every 5 (five) minutes as needed for chest pain.   pantoprazole (PROTONIX) 40 MG tablet Take 1 tablet by mouth once daily   rosuvastatin (CRESTOR) 5 MG tablet Take 1 tablet (5 mg total) by mouth daily.   vitamin B-12 (CYANOCOBALAMIN) 1000 MCG tablet Take 1,000 mcg by mouth daily.   No facility-administered encounter  medications on file as of 03/21/2022.   [name] was contacted to remind of upcoming telephone visit with Charlene Brooke on 03/24/22 at 1:30. Patient was reminded to have any blood glucose and blood pressure readings available for review at appointment.   Message was left reminding patient of appointment.  CCM referral has been placed prior to visit?  No   [facility] Rating Drugs: Medication:  Last Fill: Day Supply Lisinopril '40mg'$  03/01/22 90 Rosuvastatin '5mg'$  11/15/21  [place], CPP notified  Avel Sensor, [place]  [phone]

## 2022-03-24 ENCOUNTER — Telehealth: Payer: Medicare HMO

## 2022-03-24 NOTE — Progress Notes (Deleted)
Chronic Care Management Pharmacy Note  03/24/2022 Name:  [name] MRN:  [id number] DOB:  06-10-1935  Summary: ***  Recommendations/Changes made from today's visit: ***  Plan: ***   Subjective: [name] is an 87 y.o. year old male who is a primary patient of Bedsole, Amy E, MD.  The CCM team was consulted for assistance with disease management and care coordination needs.    Engaged with patient by telephone for follow up visit in response to provider referral for pharmacy case management and/or care coordination services.   Consent to Services:  The patient was given information about Chronic Care Management services, agreed to services, and gave verbal consent prior to initiation of services.  Please see initial visit note for detailed documentation.   Patient Care Team: Jinny Sanders, MD as PCP - General (Family Medicine) Burnell Blanks, MD as PCP - Cardiology (Cardiology) Rutherford Guys, MD as Consulting Physician (Ophthalmology) Debbora Dus, [facility] as Pharmacist (Pharmacist)  Recent office visits: 11/26/21 Dr Diona Browner OV: annual; LDL at goal < 100. Take furosemide x 1 week to see if DOE improves.  Recent consult visits: 09/21/21 Dr Angelena Form (Cardiology): f/u CAD. Stable, no changes.  Hospital visits: None in previous 6 months   Objective:  Lab Results  Component Value Date   CREATININE 0.79 11/16/2021   BUN 20 11/16/2021   GFR 81.36 11/16/2021   GFRNONAA >60 01/01/2021   GFRAA 92 09/24/2018   NA 144 11/16/2021   K 3.3 (L) 11/16/2021   CALCIUM 8.9 11/16/2021   CO2 26 11/16/2021   GLUCOSE 115 (H) 11/16/2021    Lab Results  Component Value Date/Time   HGBA1C 6.4 11/16/2021 09:06 AM   HGBA1C 7.1 (H) 09/16/2021 03:00 PM   GFR 81.36 11/16/2021 09:06 AM   GFR 78.84 09/16/2021 03:00 PM   MICROALBUR 18.5 (H) 09/11/2018 11:09 AM    Last diabetic Eye exam: No results found for: "HMDIABEYEEXA"  Last diabetic Foot exam:  Lab Results   Component Value Date/Time   HMDIABFOOTEX done 06/23/2020 12:00 AM     Lab Results  Component Value Date   CHOL 143 11/16/2021   HDL 39.10 11/16/2021   LDLCALC 92 11/16/2021   TRIG 64.0 11/16/2021   CHOLHDL 4 11/16/2021       Latest Ref Rng & Units 11/16/2021    9:06 AM 01/08/2021    3:01 PM 01/01/2021    6:21 AM  Hepatic Function  Total Protein 6.0 - 8.3 g/dL 6.6  6.0  5.2   Albumin 3.5 - 5.2 g/dL 4.2   2.5   AST 0 - 37 U/L _0 ALT 0 - 53 U/L 17  18  51   Alk Phosphatase 39 - 117 U/L 55   46   Total Bilirubin 0.2 - 1.2 mg/dL 0.6  1.1  0.5     Lab Results  Component Value Date/Time   TSH 1.24 06/23/2020 11:00 AM   TSH 2.07 01/21/2016 02:35 PM       Latest Ref Rng & Units 09/16/2021    3:00 PM 02/05/2021    4:17 PM 01/08/2021    3:01 PM  CBC  WBC 4.0 - 10.5 K/uL 9.1  8.1  14.5   Hemoglobin 13.0 - 17.0 g/dL 13.4  11.8  12.9   Hematocrit 39.0 - 52.0 % 40.8  36.7  40.0   Platelets 150.0 - 400.0 K/uL 402.0  197  374     Lab  Results  Component Value Date/Time   VD25OH 23.81 (L) 06/23/2020 11:00 AM    Clinical ASCVD: Yes  The ASCVD Risk score (Arnett DK, et al., 2019) failed to calculate for the following reasons:   The 2019 ASCVD risk score is only valid for ages 62 to 36       11/26/2021    3:58 PM 06/22/2021    1:29 PM 06/17/2020    1:53 PM  Depression screen PHQ 2/9  Decreased Interest 0 0 0  Howard, Depressed, Hopeless 0 0 0  PHQ - 2 Score 0 0 0  Altered sleeping   0  Tired, decreased energy   0  Change in appetite   0  Feeling bad or failure about yourself    0  Trouble concentrating   0  Moving slowly or fidgety/restless   0  Suicidal thoughts   0  PHQ-9 Score   0  Difficult doing work/chores   Not difficult at all     CHA2DS2/VAS Stroke Risk Points  Current as of yesterday     6 >= 2 Points: High Risk  1 - 1.99 Points: Medium Risk  0 Points: Low Risk    No Change      Points Metrics  1 Has Congestive Heart Failure:  Yes     Current as of  yesterday  1 Has Vascular Disease:  Yes    Current as of yesterday  1 Has Hypertension:  Yes    Current as of yesterday  2 Age:  89    Current as of yesterday  1 Has Diabetes:  Yes    Current as of yesterday  0 Had Stroke:  No  Had TIA:  No  Had Thromboembolism:  No    Current as of yesterday  0 Male:  No    Current as of yesterday    Social History   Tobacco Use  Smoking Status Former  Smokeless Tobacco Former  Tobacco Comments   quit in 1985. smoked 1ppd for 35 years    BP Readings from Last 3 Encounters:  11/26/21 138/80  09/21/21 116/62  09/16/21 110/64   Pulse Readings from Last 3 Encounters:  11/26/21 75  09/21/21 96  09/16/21 (!) 108   Wt Readings from Last 3 Encounters:  11/26/21 178 lb 1.6 oz (80.8 kg)  09/21/21 171 lb (77.6 kg)  09/16/21 168 lb (76.2 kg)   BMI Readings from Last 3 Encounters:  11/26/21 27.89 kg/m  09/21/21 26.78 kg/m  09/16/21 26.31 kg/m    Assessment/Interventions: Review of patient past medical history, allergies, medications, health status, including review of consultants reports, laboratory and other test data, was performed as part of comprehensive evaluation and provision of chronic care management services.   SDOH:  (Social Determinants of Health) assessments and interventions performed: {yes/no:20286} SDOH Interventions    Flowsheet Row Chronic Care Management from 11/03/2020 in [place] [facility] [place] from 06/17/2020 in [place] at [facility] from 03/03/2017 in [facility] at [place]  SDOH Interventions     Depression Interventions/Treatment  -- DXI3-3 Score <4 Follow-up Not Indicated --  [pt is being referred to PCP for further evaluation]  Financial Strain Interventions Intervention Not Indicated -- --      SDOH Screenings   Food Insecurity: No Food Insecurity (06/22/2021)  Housing: Low Risk  (06/22/2021)  Transportation Needs: No Transportation Needs  (06/22/2021)  Alcohol Screen: Low Risk  (06/22/2021)  Depression (PHQ2-9): Low Risk  (  11/26/2021)  Financial Resource Strain: Low Risk  (06/22/2021)  Physical Activity: Inactive (06/22/2021)  Social Connections: Socially Integrated (06/22/2021)  Stress: No Stress Concern Present (06/22/2021)  Tobacco Use: Medium Risk (11/26/2021)    CCM Care Plan  Allergies  Allergen Reactions   Protamine Rash    Hypotension, Rash, unstable vitals.     Statins Other (See Comments)    Caused soreness (Lipitor, Zocor)   Adhesive [Tape] Hives   Imodium [Loperamide] Itching and Rash   Rosuvastatin Calcium Other (See Comments)    Caused soreness (Lipitor, Zocor) 12/28/2020 Pt can tolerate low dose    Medications Reviewed Today     Reviewed by Mardelle Matte, CMA (Certified Medical Assistant) on 11/26/21 at [place] List Status: <None>   Medication Order Taking? Sig Documenting Provider Last Dose Status Informant  acetaminophen (TYLENOL) 500 MG tablet [id number] Yes Take 500-1,000 mg by mouth 2 (two) times daily as needed (for pain).  [provider] Taking Active Multiple Informants  albuterol (VENTOLIN HFA) 108 (90 Base) MCG/ACT inhaler [id number] Yes Inhale 2 puffs into the lungs every 6 (six) hours as needed for wheezing or shortness of breath. Jonetta Osgood, MD Taking Active   amLODipine (NORVASC) 5 MG tablet [id number] Yes Take 1 tablet by mouth once daily Burnell Blanks, MD Taking Active   amoxicillin-clavulanate (AUGMENTIN) 875-125 MG tablet [id number] Yes Take 1 tablet by mouth 2 (two) times daily. Eugenia Pancoast, FNP Taking Active   cholecalciferol (VITAMIN D3) 25 MCG (1000 UNIT) tablet [id number] Yes Take 1,000 Units by mouth daily. [provider] Taking Active Multiple Informants  ELIQUIS 5 MG TABS tablet [id number] Yes Take 1 tablet by mouth twice daily Bedsole, Amy E, MD Taking Active   furosemide (LASIX) 20 MG tablet [id number] Yes Take 1 tablet (20 mg total)  by mouth daily. Eugenia Pancoast, FNP Taking Active   isosorbide mononitrate (IMDUR) 30 MG 24 hr tablet [id number] Yes Take 1 tablet by mouth once daily Burnell Blanks, MD Taking Active   lisinopril (ZESTRIL) 40 MG tablet [id number] Yes Take 1 tablet by mouth once daily Burnell Blanks, MD Taking Active   nitroGLYCERIN (NITROSTAT) 0.4 MG SL tablet [id number] Yes Place 1 tablet (0.4 mg total) under the tongue every 5 (five) minutes as needed for chest pain. Imogene Burn, PA-C Taking Active   pantoprazole (PROTONIX) 40 MG tablet [id number] Yes Take 1 tablet by mouth once daily Copland, Spencer, MD Taking Active   rosuvastatin (CRESTOR) 5 MG tablet [id number] Yes Take 1 tablet by mouth once daily Bedsole, Amy E, MD Taking Active   vitamin B-12 (CYANOCOBALAMIN) 1000 MCG tablet [id number] Yes Take 1,000 mcg by mouth daily. [provider] Taking Active Multiple Informants            Patient Active Problem List   Diagnosis Date Noted   Pleural effusion 09/16/2021   PAF (paroxysmal atrial fibrillation) ([place]) 01/08/2021   Hypotension 01/08/2021   Pneumonia of right middle lobe due to infectious organism 12/29/2020   Streptococcal bacteremia 12/29/2020   Right carpal tunnel syndrome 03/16/2018   Hyperlipidemia associated with type 2 diabetes mellitus ([place]) 02/07/2018   DM type 2 with diabetic peripheral neuropathy ([place]) 03/10/2017   Neuropathy due to type 2 diabetes mellitus ([place]) 03/10/2017   Hypertension associated with diabetes ([place]) 04/20/2016   S/P total knee replacement 04/11/2016   Coronary artery disease involving native coronary artery of native heart without angina pectoris    S/P CABG x 4 11/03/2014  PERCUTANEOUS TRANSLUMINAL CORONARY ANGIOPLASTY, HX OF 11/26/2007    Immunization History  Administered Date(s) Administered   Fluad Quad(high Dose 65+) 03/22/2019   PFIZER(Purple Top)SARS-COV-2 Vaccination 09/08/2019, 10/08/2019   Pneumococcal Conjugate-13  01/21/2016   Pneumococcal Polysaccharide-23 06/19/2013    Conditions to be addressed/monitored:  {USCCMDZASSESSMENTOPTIONS:23563}  There are no care plans that you recently modified to display for this patient.    Medication Assistance: {MEDASSISTANCEINFO:25044}  Compliance/Adherence/Medication fill history: Care Gaps: Kidney eval - due 09/11/19 Eye exam - done 03/17/22 Foot exam - due 06/23/21  Star-Rating Drugs: Rosuvastatin - PDC 68%; LF 07/19/21, 11/15/21 x 90 ds  Medication Access: Within the past 30 days, how often has patient missed a dose of medication? *** Is a pillbox or other method used to improve adherence? {YES/NO:21197} Factors that may affect medication adherence? {CHL DESC; BARRIERS:21522} Are meds synced by current pharmacy? {YES/NO:21197} Are meds delivered by current pharmacy? {YES/NO:21197} Does patient experience delays in picking up medications due to transportation concerns? {YES/NO:21197}  [facility] Reviewed: Is patient disadvantaged to use [facility]?: {YES/NO:21197} Current Rx insurance plan: *** Name and location of Current pharmacy:  [facility] [address], [place] - [place] [address] Phone: [phone] Fax: [facility] [address] [place] [address] Phone: [phone] Fax: [phone]  [facility] services reviewed with patient today?: {YES/NO:21197} Patient requests to transfer care to [facility]?: {YES/NO:21197} Reason patient declined to change pharmacies: {US patient preference:27474}   Care Plan and Follow Up Patient Decision:  {FOLLOWUP:24991}  Plan: {CM FOLLOW UP PLAN:25073}  ***     Current Barriers:  Questions about Eliquis/cost concerns  Pharmacist Clinical Goal(s):  Patient will achieve adherence to monitoring guidelines and medication adherence to achieve therapeutic efficacy through collaboration  with PharmD and provider.   Current Barriers:  {pharmacybarriers:24917}  Pharmacist Clinical Goal(s):  Patient will {PHARMACYGOALCHOICES:24921} through collaboration with PharmD and provider.   Interventions: 1:1 collaboration with Jinny Sanders, MD regarding development and update of comprehensive plan of care as evidenced by provider attestation and co-signature Inter-disciplinary care team collaboration (see longitudinal plan of care) Comprehensive medication review performed; medication list updated in electronic medical record  Hypertension (BP goal <140/90) -Controlled - Clinic readings within goal -Current home readings: n/a -Current treatment: Amlodipine 5 mg daily  Lisinopril 40 mg daily Furosemide 20 mg daily -Medications previously tried: metoprolol (fatigue) -Current dietary habits: none specific, limits sweets -Current exercise habits: gardening, no formal exercise  -Recommended to continue current medication  Atrial fibrillation (Goal: Control symptoms/prevention of stroke) -Controlled, followed by cardiology -He reports concern about abnormal feeling in chest since starting Eliquis. He also still has some questions about purpose of Eliquis when Plavix is more affordable. Discussed patient assistance option and income requirement of < 55,000 annually for family of 2. He would like to discuss further Eliquis versus Plavix (differences/necessity) with cardiologist. He will continue his Eliquis at this time. He thinks he will not be eligible for Eliquis PAP, but he will check. His refills have been timely. Current treatment:  Eliquis 5 mg BID -Medications previously tried: aspirin -Recommend continue current medications  Hyperlipidemia / CAD: (LDL goal < 70) -{US controlled/uncontrolled:25276} - LDL 92 (11/2021), up from 69 -Patient has stopped taking rosuvastatin due to joint aches and is taking red yeast rice instead. He reports PCP is aware that he sometimes does  this. -Rosuvastatin adherence PDC: 78%, currently failing quality metric for statin adherence for the  year. Filled 07/19/21 x 90 ds, 11/15/21 x 90 ds. -Hx CAD: CABG 2016, stent prior to that in 2005 -Current treatment: Rosuvastatin 5 mg daily Imdur 30 mg daily HS Nitroglycerin 0.4 mg SL PRN -Medications previously tried: none  -Recommended to continue current medication  Diabetes (A1c goal <7%) -Diet-Controlled - A1c 6.4% (11/2021) -Current home glucose readings - not checking routinely  Patient Goals/Self-Care Activities Patient will:  - {pharmacypatientgoals:24919}

## 2022-05-23 ENCOUNTER — Telehealth: Payer: Medicare HMO

## 2022-05-27 ENCOUNTER — Other Ambulatory Visit: Payer: Self-pay | Admitting: Cardiovascular Disease

## 2022-05-31 ENCOUNTER — Ambulatory Visit: Payer: Medicare HMO | Admitting: Family Medicine

## 2022-06-01 ENCOUNTER — Other Ambulatory Visit: Payer: Self-pay | Admitting: Family Medicine

## 2022-06-07 ENCOUNTER — Telehealth: Payer: Self-pay

## 2022-06-07 NOTE — Chronic Care Management (AMB) (Unsigned)
Medication Adherence  Patient is at risk for failing the Medicare adherence STAR measure for the following medication:  Lisinopril 40 mg  Pharmacy fill dates/day supply this year: 08/17/21 #90 for 90 ds 11/14/21 #90 for 90 ds 03/01/22 #90 for 90 ds  Action taken:  Contacted the patient to discuss adherence. Patient does not miss any doses and explained that in 02/2021 cardiology cut his dose back to 1/2 tablet daily.  Charlene Brooke, CPP notified  [name], [place]  [phone]

## 2022-06-08 ENCOUNTER — Encounter: Payer: Self-pay | Admitting: Family Medicine

## 2022-06-08 ENCOUNTER — Ambulatory Visit (INDEPENDENT_AMBULATORY_CARE_PROVIDER_SITE_OTHER): Payer: Medicare HMO | Admitting: Family Medicine

## 2022-06-08 VITALS — BP 122/62 | HR 84 | Temp 98.1°F | Ht 67.0 in | Wt 181.0 lb

## 2022-06-08 DIAGNOSIS — E785 Hyperlipidemia, unspecified: Secondary | ICD-10-CM

## 2022-06-08 DIAGNOSIS — E1169 Type 2 diabetes mellitus with other specified complication: Secondary | ICD-10-CM | POA: Diagnosis not present

## 2022-06-08 DIAGNOSIS — E1142 Type 2 diabetes mellitus with diabetic polyneuropathy: Secondary | ICD-10-CM

## 2022-06-08 DIAGNOSIS — J0100 Acute maxillary sinusitis, unspecified: Secondary | ICD-10-CM

## 2022-06-08 DIAGNOSIS — J90 Pleural effusion, not elsewhere classified: Secondary | ICD-10-CM

## 2022-06-08 DIAGNOSIS — E1159 Type 2 diabetes mellitus with other circulatory complications: Secondary | ICD-10-CM

## 2022-06-08 DIAGNOSIS — E1140 Type 2 diabetes mellitus with diabetic neuropathy, unspecified: Secondary | ICD-10-CM | POA: Diagnosis not present

## 2022-06-08 DIAGNOSIS — I152 Hypertension secondary to endocrine disorders: Secondary | ICD-10-CM

## 2022-06-08 LAB — POCT GLYCOSYLATED HEMOGLOBIN (HGB A1C): Hemoglobin A1C: 5.7 % — AB (ref 4.0–5.6)

## 2022-06-08 MED ORDER — AMOXICILLIN 500 MG PO CAPS: 1000.0000 mg | ORAL_CAPSULE | Freq: Two times a day (BID) | ORAL | 0 refills | Status: DC

## 2022-06-08 NOTE — Assessment & Plan Note (Addendum)
Stable, chronic.  Continue current medication.   lisinopril 40 mg daily , amlodipine 5 mg daily  

## 2022-06-08 NOTE — Progress Notes (Signed)
Patient ID: [name], male    DOB: 20-Mar-1935, 87 y.o.   MRN: [id number]  This visit was conducted in person.  BP 122/62 (BP Location: Left Arm, Patient Position: Sitting, Cuff Size: Normal)   Pulse 84   Temp 98.1 F (36.7 C) (Temporal)   Ht '5\' 7"'$  (1.702 m)   Wt 181 lb (82.1 kg)   SpO2 98%   BMI 28.35 kg/m    CC:  Chief Complaint  Patient presents with   Diabetes    Subjective:   HPI: DENSEL Howard is a 87 y.o. male presenting on 06/08/2022 for Diabetes     ON 09/13/2021 and again on  11/16/2021 CXR small pleural effusion seen after resolve PNA... was treated with furosemide.  He reports breathing return almost to baseline. Still having decreased energy. He is on B12, Vit D  Consider repeat for evaluation of resolution.    In last week  Date of onset(05/31/2022) he has had nasal congestion, green discharge.  Forehead pain.  Productive cough, worse at night. Started with ST, none now.  No body aches.  Fatigue, no  change in SOB. No chest pain.   No fever   Diabetes:  Diet controlled. Lab Results  Component Value Date   HGBA1C 5.7 (A) 06/08/2022  Using medications without difficulties: Hypoglycemic episodes: Hyperglycemic episodes: Feet problems: Blood Sugars averaging: eye exam within last year:  Hypertension:  Well-controlled on lisinopril 40 mg daily, amlodipine 5 mg daily BP Readings from Last 3 Encounters:  06/08/22 122/62  11/26/21 138/80  09/21/21 116/62  Using medication without problems or lightheadedness:  Chest pain with exertion: Edema: Short of breath: Average home BPs: Other issues:  Elevated Cholesterol:  Lab Results  Component Value Date   CHOL 143 11/16/2021   HDL 39.10 11/16/2021   LDLCALC 92 11/16/2021   TRIG 64.0 11/16/2021   CHOLHDL 4 11/16/2021  Using medications without problems: Muscle aches:  Diet compliance:moderate Exercise:none Other complaints:       Relevant past medical, surgical, family and social  history reviewed and updated as indicated. Interim medical history since our last visit reviewed. Allergies and medications reviewed and updated. Outpatient Medications Prior to Visit  Medication Sig Dispense Refill   acetaminophen (TYLENOL) 500 MG tablet Take 500-1,000 mg by mouth 2 (two) times daily as needed (for pain).      albuterol (VENTOLIN HFA) 108 (90 Base) MCG/ACT inhaler Inhale 2 puffs into the lungs every 6 (six) hours as needed for wheezing or shortness of breath. 18 g 0   amLODipine (NORVASC) 5 MG tablet Take 1 tablet by mouth once daily 90 tablet 0   cholecalciferol (VITAMIN D3) 25 MCG (1000 UNIT) tablet Take 1,000 Units by mouth daily.     ELIQUIS 5 MG TABS tablet Take 1 tablet by mouth twice daily 180 tablet 0   furosemide (LASIX) 20 MG tablet Take 1 tablet (20 mg total) by mouth daily. 30 tablet 3   isosorbide mononitrate (IMDUR) 30 MG 24 hr tablet Take 1 tablet by mouth once daily 90 tablet 2   lisinopril (ZESTRIL) 40 MG tablet Take 1 tablet by mouth once daily 90 tablet 2   nitroGLYCERIN (NITROSTAT) 0.4 MG SL tablet Place 1 tablet (0.4 mg total) under the tongue every 5 (five) minutes as needed for chest pain. 25 tablet 3   pantoprazole (PROTONIX) 40 MG tablet Take 1 tablet by mouth once daily 90 tablet 1   vitamin B-12 (CYANOCOBALAMIN) 1000 MCG tablet Take  1,000 mcg by mouth daily.     rosuvastatin (CRESTOR) 5 MG tablet Take 1 tablet (5 mg total) by mouth daily. (Patient not taking: Reported on 06/08/2022) 90 tablet 2   No facility-administered medications prior to visit.     Per HPI unless specifically indicated in ROS section below Review of Systems  Constitutional:  Negative for fatigue and fever.  HENT:  Negative for ear pain.   Eyes:  Negative for pain.  Respiratory:  Positive for cough and shortness of breath.   Cardiovascular:  Negative for chest pain, palpitations and leg swelling.  Gastrointestinal:  Negative for abdominal pain.  Genitourinary:  Negative for  dysuria.  Musculoskeletal:  Negative for arthralgias.  Neurological:  Negative for syncope, light-headedness and headaches.  Psychiatric/Behavioral:  Negative for dysphoric mood.    Objective:  BP 122/62 (BP Location: Left Arm, Patient Position: Sitting, Cuff Size: Normal)   Pulse 84   Temp 98.1 F (36.7 C) (Temporal)   Ht '5\' 7"'$  (1.702 m)   Wt 181 lb (82.1 kg)   SpO2 98%   BMI 28.35 kg/m   Wt Readings from Last 3 Encounters:  06/08/22 181 lb (82.1 kg)  11/26/21 178 lb 1.6 oz (80.8 kg)  09/21/21 171 lb (77.6 kg)      Physical Exam Constitutional:      Appearance: He is well-developed.  HENT:     Head: Normocephalic.     Right Ear: Hearing normal.     Left Ear: Hearing normal.     Nose: Nose normal.  Neck:     Thyroid: No thyroid mass or thyromegaly.     Vascular: No carotid bruit.     Trachea: Trachea normal.  Cardiovascular:     Rate and Rhythm: Normal rate and regular rhythm.     Pulses: Normal pulses.     Heart sounds: Heart sounds not distant. No murmur heard.    No friction rub. No gallop.     Comments: No peripheral edema Pulmonary:     Effort: Pulmonary effort is normal. No respiratory distress.     Breath sounds: Normal breath sounds.  Skin:    General: Skin is warm and dry.     Findings: No rash.  Psychiatric:        Speech: Speech normal.        Behavior: Behavior normal.        Thought Content: Thought content normal.       Results for orders placed or performed in visit on 06/08/22  POCT glycosylated hemoglobin (Hb A1C)  Result Value Ref Range   Hemoglobin A1C 5.7 (A) 4.0 - 5.6 %   HbA1c POC (<> result, manual entry)     HbA1c, POC (prediabetic range)     HbA1c, POC (controlled diabetic range)       COVID 19 screen:  No recent travel or known exposure to COVID19 The patient denies respiratory symptoms of COVID 19 at this time. The importance of social distancing was discussed today.   Assessment and Plan    Problem List Items Addressed  This Visit     DM type 2 with diabetic peripheral neuropathy ([place]) - Primary (Chronic)    Chronic, stable diet controlled. Associated with hypertension and peripheral neuropathy.      Relevant Orders   POCT glycosylated hemoglobin (Hb A1C) (Completed)   Hyperlipidemia associated with type 2 diabetes mellitus ([facility]) (Chronic)    Stable, chronic.  Continue current medication. LDL goal less than 100  Crestor 5  mg daily      Hypertension associated with diabetes ([facility]) (Chronic)    Stable, chronic.  Continue current medication.   lisinopril 40 mg daily, amlodipine 5 mg daily        Neuropathy due to type 2 diabetes mellitus ([place]) (Chronic)    Due to diabetes, stable on no medication.      Acute non-recurrent maxillary sinusitis     Start Flonase 2 spray per nostril daily and nasal saline irrigation.  Start plain Mucinex or Mucinex DM.  Complete antibiotics x 10 days.      Pleural effusion    Return for CXR in 2 weeks for eval for resolution.        Eliezer Lofts, MD

## 2022-06-08 NOTE — Assessment & Plan Note (Signed)
Stable, chronic.  Continue current medication. LDL goal less than 100  Crestor 5 mg daily

## 2022-06-08 NOTE — Assessment & Plan Note (Signed)
Chronic, stable diet controlled. Associated with hypertension and peripheral neuropathy.

## 2022-06-08 NOTE — Patient Instructions (Addendum)
Start Flonase 2 spray per nostril daily and nasal saline irrigation.  Start plain Mucinex or Mucinex DM.  Complete antibiotics x 10 days.

## 2022-06-08 NOTE — Assessment & Plan Note (Signed)
Due to diabetes, stable on no medication.

## 2022-06-09 NOTE — Telephone Encounter (Signed)
Reviewed chart, per OV 02/09/2021 with PA Ermalinda Barrios at [facility], lisinopril 40 mg was indeed changed to 1/2 tablet daily. eRx needs to be updated to avoid failing STAR measure. Routing to cardiology.

## 2022-06-09 NOTE — Telephone Encounter (Signed)
Hey you can update this, I will have to send it to the doctor to be able to change it. Thanks

## 2022-06-10 NOTE — Addendum Note (Signed)
Addended by: Charlton Haws on: 06/10/2022 10:38 AM   Modules accepted: Orders

## 2022-06-22 ENCOUNTER — Ambulatory Visit (INDEPENDENT_AMBULATORY_CARE_PROVIDER_SITE_OTHER)
Admission: RE | Admit: 2022-06-22 | Discharge: 2022-06-22 | Disposition: A | Discharge status: Home / Self Care | Payer: Medicare HMO | Source: Ambulatory Visit | Attending: Family Medicine | Admitting: Family Medicine

## 2022-06-22 ENCOUNTER — Other Ambulatory Visit: Payer: Self-pay | Admitting: Cardiovascular Disease

## 2022-06-22 ENCOUNTER — Ambulatory Visit (INDEPENDENT_AMBULATORY_CARE_PROVIDER_SITE_OTHER): Payer: Medicare HMO | Admitting: Family Medicine

## 2022-06-22 ENCOUNTER — Encounter: Payer: Self-pay | Admitting: Family Medicine

## 2022-06-22 VITALS — BP 130/68 | HR 72 | Temp 97.9°F | Ht 67.0 in | Wt 179.5 lb

## 2022-06-22 DIAGNOSIS — J90 Pleural effusion, not elsewhere classified: Secondary | ICD-10-CM

## 2022-06-22 DIAGNOSIS — K449 Diaphragmatic hernia without obstruction or gangrene: Secondary | ICD-10-CM | POA: Diagnosis not present

## 2022-06-22 NOTE — Assessment & Plan Note (Signed)
Noted on chest x-ray despite resolved pneumonia.  His symptoms have returned to baseline and he is feeling well.  We will repeat chest x-ray today for complete resolution.  Lung exam within the normal range.

## 2022-06-22 NOTE — Progress Notes (Signed)
Patient ID: [name], male    DOB: 1935-06-13, 87 y.o.   MRN: [id number]  This visit was conducted in person.  BP 130/68 (BP Location: Left Arm, Patient Position: Sitting)   Pulse 72   Temp 97.9 F (36.6 C) (Skin)   Ht '5\' 7"'$  (1.702 m)   Wt 179 lb 8 oz (81.4 kg)   SpO2 96%   BMI 28.11 kg/m    CC:  Chief Complaint  Patient presents with   Follow-up    Fatigue, fluid on lungs    Subjective:   HPI: [name] is a 87 y.o. male presenting on 06/22/2022 for Follow-up (Fatigue, fluid on lungs)   At last OV 06/08/2022   ON 09/13/2021 and again on  11/16/2021 CXR small pleural effusion seen after resolved PNA... was treated with furosemide.  He reports breathing return almost to baseline. Still having decreased energy. He is on B12, Vit D  Consider repeat for evaluation of resolution.  11/29 treated again for recurrent respiratory infection likely sinusitis with amox x 10 days.  Presents today for possible follow up CXR.  He reports today sinus symptoms are significantly better.  Minimal SOB, only with exertion.  He is not tired... has ben sitting around a lot.. feels better if he get up and move around.    Wt Readings from Last 3 Encounters:  06/22/22 179 lb 8 oz (81.4 kg)  06/08/22 181 lb (82.1 kg)  11/26/21 178 lb 1.6 oz (80.8 kg)        Relevant past medical, surgical, family and social history reviewed and updated as indicated. Interim medical history since our last visit reviewed. Allergies and medications reviewed and updated. Outpatient Medications Prior to Visit  Medication Sig Dispense Refill   acetaminophen (TYLENOL) 500 MG tablet Take 500-1,000 mg by mouth 2 (two) times daily as needed (for pain).      albuterol (VENTOLIN HFA) 108 (90 Base) MCG/ACT inhaler Inhale 2 puffs into the lungs every 6 (six) hours as needed for wheezing or shortness of breath. 18 g 0   amLODipine (NORVASC) 5 MG tablet Take 1 tablet by mouth once daily 90 tablet 0    cholecalciferol (VITAMIN D3) 25 MCG (1000 UNIT) tablet Take 1,000 Units by mouth daily.     ELIQUIS 5 MG TABS tablet Take 1 tablet by mouth twice daily 180 tablet 0   furosemide (LASIX) 20 MG tablet Take 1 tablet (20 mg total) by mouth daily. 30 tablet 3   lisinopril (ZESTRIL) 40 MG tablet Take 0.5 tablets by mouth daily.     nitroGLYCERIN (NITROSTAT) 0.4 MG SL tablet Place 1 tablet (0.4 mg total) under the tongue every 5 (five) minutes as needed for chest pain. 25 tablet 3   pantoprazole (PROTONIX) 40 MG tablet Take 1 tablet by mouth once daily 90 tablet 1   vitamin B-12 (CYANOCOBALAMIN) 1000 MCG tablet Take 1,000 mcg by mouth daily.     isosorbide mononitrate (IMDUR) 30 MG 24 hr tablet Take 1 tablet by mouth once daily 90 tablet 2   amoxicillin (AMOXIL) 500 MG capsule Take 2 capsules (1,000 mg total) by mouth 2 (two) times daily. (Patient not taking: Reported on 06/22/2022) 40 capsule 0   No facility-administered medications prior to visit.     Per HPI unless specifically indicated in ROS section below Review of Systems  Constitutional:  Negative for fatigue and fever.  HENT:  Negative for ear pain.   Eyes:  Negative for pain.  Respiratory:  Negative for cough and shortness of breath.   Cardiovascular:  Negative for chest pain, palpitations and leg swelling.  Gastrointestinal:  Negative for abdominal pain.  Genitourinary:  Negative for dysuria.  Musculoskeletal:  Negative for arthralgias.  Neurological:  Negative for syncope, light-headedness and headaches.  Psychiatric/Behavioral:  Negative for dysphoric mood.    Objective:  BP 130/68 (BP Location: Left Arm, Patient Position: Sitting)   Pulse 72   Temp 97.9 F (36.6 C) (Skin)   Ht '5\' 7"'$  (1.702 m)   Wt 179 lb 8 oz (81.4 kg)   SpO2 96%   BMI 28.11 kg/m   Wt Readings from Last 3 Encounters:  06/22/22 179 lb 8 oz (81.4 kg)  06/08/22 181 lb (82.1 kg)  11/26/21 178 lb 1.6 oz (80.8 kg)      Physical Exam Constitutional:       Appearance: He is well-developed.  HENT:     Head: Normocephalic.     Right Ear: Hearing normal.     Left Ear: Hearing normal.     Nose: Nose normal.  Neck:     Thyroid: No thyroid mass or thyromegaly.     Vascular: No carotid bruit.     Trachea: Trachea normal.  Cardiovascular:     Rate and Rhythm: Normal rate and regular rhythm.     Pulses: Normal pulses.     Heart sounds: Heart sounds not distant. No murmur heard.    No friction rub. No gallop.     Comments: No peripheral edema Pulmonary:     Effort: Pulmonary effort is normal. No respiratory distress.     Breath sounds: Normal breath sounds.  Skin:    General: Skin is warm and dry.     Findings: No rash.  Psychiatric:        Speech: Speech normal.        Behavior: Behavior normal.        Thought Content: Thought content normal.       Results for orders placed or performed in visit on 06/08/22  POCT glycosylated hemoglobin (Hb A1C)  Result Value Ref Range   Hemoglobin A1C 5.7 (A) 4.0 - 5.6 %   HbA1c POC (<> result, manual entry)     HbA1c, POC (prediabetic range)     HbA1c, POC (controlled diabetic range)       COVID 19 screen:  No recent travel or known exposure to COVID19 The patient denies respiratory symptoms of COVID 19 at this time. The importance of social distancing was discussed today.   Assessment and Plan    Problem List Items Addressed This Visit     Pleural effusion - Primary    Noted on chest x-ray despite resolved pneumonia.  His symptoms have returned to baseline and he is feeling well.  We will repeat chest x-ray today for complete resolution.  Lung exam within the normal range.      Relevant Orders   DG Chest 2 View   Orders Placed This Encounter  Procedures   DG Chest 2 View    Standing Status:   Future    Number of Occurrences:   1    Standing Expiration Date:   06/23/2023    Order Specific Question:   Reason for Exam (SYMPTOM  OR DIAGNOSIS REQUIRED)    Answer:   pleural effusion     Order Specific Question:   Preferred imaging location?    Answer:   [facility]  Diona Browner, MD

## 2022-06-29 ENCOUNTER — Ambulatory Visit (INDEPENDENT_AMBULATORY_CARE_PROVIDER_SITE_OTHER): Payer: Medicare HMO

## 2022-06-29 VITALS — Ht 67.0 in | Wt 179.0 lb

## 2022-06-29 DIAGNOSIS — Z0000 Encounter for general adult medical examination without abnormal findings: Secondary | ICD-10-CM

## 2022-06-29 NOTE — Progress Notes (Signed)
Subjective:   [name] is a 87 y.o. male who presents for Medicare Annual/Subsequent preventive examination.  Review of Systems    No ROS.  Medicare Wellness Virtual Visit.  Visual/audio telehealth visit, UTA vital signs.   See social history for additional risk factors.   Cardiac Risk Factors include: advanced age (>74mn, >>66women);male gender;diabetes mellitus     Objective:    Today's Vitals   06/29/22 1051  Weight: 179 lb (81.2 kg)  Height: _0  (1.702 m)   Body mass index is 28.04 kg/m.     11/26/2021    4:03 PM 06/22/2021    1:23 PM 12/28/2020    4:38 PM 12/27/2020    9:31 PM 06/17/2020    1:52 PM 03/07/2018   10:23 AM 03/03/2017   10:21 AM  Advanced Directives  Does Patient Have a Medical Advance Directive? No Yes No No Yes Yes Yes  Type of ASocial research officer, governmentLiving will   [place] will Living will  Does patient want to make changes to medical advance directive?  Yes (MAU/Ambulatory/Procedural Areas - Information given)       [place] of [place]?     No - copy requested No - copy requested   Would patient like information on creating a medical advance directive? Yes (MAU/Ambulatory/Procedural Areas - Information given)  No - Patient declined        Current Medications (verified) Outpatient Encounter Medications as of 06/29/2022  Medication Sig   acetaminophen (TYLENOL) 500 MG tablet Take 500-1,000 mg by mouth 2 (two) times daily as needed (for pain).    albuterol (VENTOLIN HFA) 108 (90 Base) MCG/ACT inhaler Inhale 2 puffs into the lungs every 6 (six) hours as needed for wheezing or shortness of breath.   amLODipine (NORVASC) 5 MG tablet Take 1 tablet by mouth once daily   cholecalciferol (VITAMIN D3) 25 MCG (1000 UNIT) tablet Take 1,000 Units by mouth daily.   ELIQUIS 5 MG TABS tablet Take 1 tablet by mouth twice daily   furosemide  (LASIX) 20 MG tablet Take 1 tablet (20 mg total) by mouth daily.   isosorbide mononitrate (IMDUR) 30 MG 24 hr tablet Take 1 tablet by mouth once daily   lisinopril (ZESTRIL) 40 MG tablet Take 0.5 tablets by mouth daily.   nitroGLYCERIN (NITROSTAT) 0.4 MG SL tablet Place 1 tablet (0.4 mg total) under the tongue every 5 (five) minutes as needed for chest pain.   pantoprazole (PROTONIX) 40 MG tablet Take 1 tablet by mouth once daily   vitamin B-12 (CYANOCOBALAMIN) 1000 MCG tablet Take 1,000 mcg by mouth daily.   No facility-administered encounter medications on file as of 06/29/2022.    Allergies (verified) Protamine, Statins, Adhesive [tape], Imodium [loperamide], and Rosuvastatin calcium   History: Past Medical History:  Diagnosis Date   CAD (coronary artery disease)    s/p stent mid LAD 2005 // s/p CABG 2016 // Myoview 8/19:  EF 64, no ischemia   Coronary artery disease involving native coronary artery of native heart without angina pectoris    Depression    DJD (degenerative joint disease)    DM type 2 with diabetic peripheral neuropathy ([place] 03/10/2017   E coli bacteremia 04/20/2016   Essential hypertension 04/20/2016   GERD (gastroesophageal reflux disease)    History of echocardiogram    Echo 8/19: Moderate LVH, EF 60-65, normal wall motion, grade 1 diastolic dysfunction,  mild MR, mild LAE, normal RVSF, mild TR, PASP 25   History of hiatal hernia    HTN (hypertension)    Hyperlipidemia    Neuropathy due to type 2 diabetes mellitus ([place]) 03/10/2017   PERCUTANEOUS TRANSLUMINAL CORONARY ANGIOPLASTY, HX OF 11/26/2007   Annotation: With CYPHER STENT. Qualifier: Diagnosis of  By: Danny Lawless CMA, [place]     Pre-diabetes    S/P CABG x 4 11/03/2014   S/P total knee replacement 04/11/2016   Sepsis due to Escherichia coli (E. coli) ([place]) 04/20/2016   Tinea pedis 09/08/2017   Past Surgical History:  Procedure Laterality Date   BRAIN SURGERY     CARDIAC CATHETERIZATION     CORONARY ARTERY  BYPASS GRAFT N/A 11/03/2014   Procedure: CORONARY ARTERY BYPASS GRAFTING (CABG), ON PUMP, TIMES FOUR, USING LEFT INTERNAL MAMMARY, RIGHT GREATER SAPHENOUS VEIN HARVESTED ENDOSCOPICALLY;  Surgeon: Ivin Poot, MD;  Location: [facility];  Service: Open Heart Surgery;  Laterality: N/A;  LIMA-LAD; SVG-DIAG; SVG-OM; SVG-RCA   CRANIOTOMY N/A 01/18/2014   Procedure: CRANIOTOMY HEMATOMA EVACUATION SUBDURAL;  Surgeon: Hosie Spangle, MD;  Location: [facility];  Service: Neurosurgery;  Laterality: N/A;   kidney stone removal     KNEE SURGERY     LEFT HEART CATHETERIZATION WITH CORONARY ANGIOGRAM N/A 07/27/2011   Procedure: LEFT HEART CATHETERIZATION WITH CORONARY ANGIOGRAM;  Surgeon: Burnell Blanks, MD;  Location[facility] C[facility] LAB;  Service: Cardiovascular;  Laterality: N/A;   LEFT HEART CATHETERIZATION WITH CORONARY ANGIOGRAM N/A 10/31/2014   Procedure: LEFT HEART CATHETERIZATION WITH CORONARY ANGIOGRAM;  Surgeon: Sherren Mocha, MD;  Location: [facility] CATH LAB;  Service: Cardiovascular;  Laterality: N/A;   PTCA     Hx of it.    SIGMOIDOSCOPY     TEE WITHOUT CARDIOVERSION N/A 11/03/2014   Procedure: TRANSESOPHAGEAL ECHOCARDIOGRAM (TEE);  Surgeon: Ivin Poot, MD;  Location: [place];  Service: Open Heart Surgery;  Laterality: N/A;   TOTAL KNEE ARTHROPLASTY  02/2011   Right knee   TOTAL KNEE ARTHROPLASTY Left 04/11/2016   Procedure: LEFT TOTAL KNEE ARTHROPLASTY;  Surgeon: Vickey Huger, MD;  Location: [place];  Service: Orthopedics;  Laterality: Left;   Family History  Problem Relation Age of Onset   Heart disease Father    Heart attack Father    Heart failure Mother    COPD Mother    Heart disease Sister    Heart attack Sister    Heart attack Brother    Social History   Socioeconomic History   Marital status: Married    Spouse name: Not on file   Number of children: Not on file   Years of education: Not on file   Highest education level: Not on file  Occupational History   Not on file  Tobacco Use    Smoking status: Former   Smokeless tobacco: Former   Tobacco comments:    quit in 1985. smoked 1ppd for 35 years   Vaping Use   Vaping Use: Never used  Substance and Sexual Activity   Alcohol use: No   Drug use: No   Sexual activity: Never  Other Topics Concern   Not on file  Social History Narrative   Married with children. 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 10:20 am    Has living will,  HCPOA: jerrie Derusha, full code (reviewed 69)   Social Determinants of Health   Financial Resource Strain: Low Risk  (06/22/2021)   Overall Emergency planning/management officer Strain (  CARDIA)    Difficulty of Paying Living Expenses: Not hard at all  Food Insecurity: No Food Insecurity (06/22/2021)   Hunger Vital Sign    Worried About Running Out of Food in the Last Year: Never true    Ran Out of Food in the Last Year: Never true  Transportation Needs: No Transportation Needs (06/22/2021)   PRAPARE - Hydrologist (Medical): No    Lack of Transportation (Non-Medical): No  Physical Activity: Inactive (06/22/2021)   Exercise Vital Sign    Days of Exercise per Week: 0 days    Minutes of Exercise per Session: 0 min  Stress: No Stress Concern Present (06/22/2021)   [place]    Feeling of Stress : Not at all  Social Connections: [place] (06/22/2021)   Social Connection and Isolation Panel [NHANES]    Frequency of Communication with Friends and Family: More than three times a week    Frequency of Social Gatherings with Friends and Family: Three times a week    Attends Religious Services: More than 4 times per year    Active Member of Clubs or Organizations: Yes    Attends Archivist Meetings: More than 4 times per year    Marital Status: Married    Tobacco Counseling Counseling given: Not Answered Tobacco comments: quit in 1985. smoked 1ppd for 35  years    Clinical Intake:  Pre-visit preparation completed: Yes           How often do you need to have someone help you when you read instructions, pamphlets, or other written materials from your doctor or pharmacy?: 1 - Never Nutrition Risk Assessment: Does the patient have any non-healing wounds?  No  Has the patient had any unintentional weight loss or weight gain?  No   Diabetes: Is the patient diabetic?  Yes  If diabetic, was a CBG obtained today?  No  Did the patient bring in their glucometer from home?  No  How often do you monitor your CBG's? Every once in awhile.   Financial Strains and Diabetes Management: Are you having any financial strains with the device, your supplies or your medication? No.  Does the patient want to be seen by Chronic Care Management for management of their diabetes?  No  Would the patient like to be referred to a Nutritionist or for Diabetic Management?  No   Diabetic labs followed by pcp.  Foot exam due.      Interpreter Needed?: No      Activities of Daily Living    06/29/2022   10:58 AM 11/26/2021    3:59 PM  In your present state of health, do you have any difficulty performing the following activities:  Hearing? 1 1  Comment Hearing aids   Vision? 0 0  Difficulty concentrating or making decisions? 0 0  Walking or climbing stairs? 0 0  Comment Paces self when walking   Dressing or bathing? 0 0  Doing errands, shopping? 0 1  Preparing Food and eating ? N   Using the Toilet? N Y  In the past six months, have you accidently leaked urine? N N  Do you have problems with loss of bowel control? N N  Managing your Medications? N Y  Managing your Finances? N Y  Housekeeping or managing your Housekeeping? N Y    Patient Care Team: Jinny Sanders, MD as PCP - General (Family Medicine)  Burnell Blanks, MD as PCP - Cardiology (Cardiology) Rutherford Guys, MD as Consulting Physician (Ophthalmology) Debbora Dus, [facility] as  Pharmacist (Pharmacist)  Indicate any recent Medical Services you may have received from other than [facility] providers in the past year (date may be approximate).     Assessment:   This is a routine wellness examination for [name].  I connected with  [name] on 06/29/22 by a audio enabled telemedicine application and verified that I am speaking with the correct person using two identifiers.  Patient Location: Home  Provider Location: Office/Clinic  I discussed the limitations of evaluation and management by telemedicine. The patient expressed understanding and agreed to proceed.   Hearing/Vision screen Hearing Screening - Comments:: Hearing aid, bilateral  Vision Screening - Comments:: Followed by dr. Corrin Parker Wears corrective lenses when reading Cataract extraction, bilateral They have seen their ophthalmologist in the last 12 months.    Dietary issues and exercise activities discussed: Current Exercise Habits: Home exercise routine, Type of exercise: walking, Intensity: Mild Healthy diet Good water intake   Goals Addressed             This Visit's Progress    Increase physical activity       Walking more       Depression Screen    06/29/2022   10:58 AM 11/26/2021    3:58 PM 06/22/2021    1:29 PM 06/17/2020    1:53 PM 03/22/2019   10:20 AM 03/07/2018   10:25 AM 03/03/2017   10:21 AM  PHQ 2/9 Scores  PHQ - 2 Score 0 0 0 0 0 0 0  PHQ- 9 Score    0  0 1    Fall Risk    06/29/2022   10:56 AM 11/26/2021    3:58 PM 06/22/2021    1:27 PM 06/23/2020    9:56 AM 06/17/2020    1:53 PM  [place] in the past year? 0 0 0 0 0  Number falls in past yr: 0 0 0  0  Injury with Fall?   0  0  Risk for fall due to :   No Fall Risks  Medication side effect  Follow up Falls evaluation completed;Falls prevention discussed Falls evaluation completed Falls prevention discussed Falls evaluation completed Falls evaluation completed;Falls prevention discussed    FALL RISK  PREVENTION PERTAINING TO THE HOME: Home free of loose throw rugs in walkways, pet beds, electrical cords, etc? Yes  Adequate lighting in your home to reduce risk of falls? Yes   ASSISTIVE DEVICES UTILIZED TO PREVENT FALLS: Life alert? No  Use of a cane, walker or w/c? Yes , cane as needed. Grab bars in the bathroom? Yes  Shower chair or bench in shower? Yes  Elevated toilet seat or a handicapped toilet? No   TIMED UP AND GO: Was the test performed? No .   Cognitive Function:    06/17/2020    1:56 PM 03/07/2018   10:24 AM 03/03/2017    9:16 AM 01/21/2016   12:55 PM  MMSE - Mini Mental State Exam  Orientation to time _0 Orientation to Place _1 Registration _2 Attention/ Calculation 5 0 0 0  Recall _3 Language- name 2 objects  0 0 0  Language- repeat  _4 Language- follow 3 step command  _5 Language- read & follow direction  0 0 0  Write a sentence  0 0 0  Copy design  0 0 0  Total score  _0 06/29/2022   11:00 AM 11/26/2021    4:01 PM  6CIT Screen  What Year? 0 points 0 points  What month? 0 points 0 points  What time? 0 points 0 points  Count back from 20 0 points 2 points  Months in reverse 0 points 0 points  Repeat phrase 0 points 0 points  Total Score 0 points 2 points    Immunizations Immunization History  Administered Date(s) Administered   Fluad Quad(high Dose 65+) 03/22/2019   PFIZER(Purple Top)SARS-COV-2 Vaccination 09/08/2019, 10/08/2019   Pneumococcal Conjugate-13 01/21/2016   Pneumococcal Polysaccharide-23 06/19/2013    TDAP status: Due, Education has been provided regarding the importance of this vaccine. Advised may receive this vaccine at local pharmacy or Health Dept. Aware to provide a copy of the vaccination record if obtained from local pharmacy or Health Dept. Verbalized acceptance and understanding. Declined per patient.   Flu Vaccine status: Declined, Education has been provided regarding the  importance of this vaccine but patient still declined. Advised may receive this vaccine at local pharmacy or Health Dept. Aware to provide a copy of the vaccination record if obtained from local pharmacy or Health Dept. Verbalized acceptance and understanding.  Covid-19 vaccine status: Completed x2.    Shingrix Completed?: No.    Education has been provided regarding the importance of this vaccine. Patient has been advised to call insurance company to determine out of pocket expense if they have not yet received this vaccine. Advised may also receive vaccine at local pharmacy or Health Dept. Verbalized acceptance and understanding.Declined per patient.   Screening Tests Health Maintenance  Topic Date Due   DTaP/Tdap/Td (1 - Tdap) Never done   Diabetic kidney evaluation - Urine ACR  09/11/2019   FOOT EXAM  06/23/2021   COVID-19 Vaccine (3 - 2023-24 season) 07/15/2022 (Originally 03/11/2022)   Zoster Vaccines- Shingrix (1 of 2) 09/28/2022 (Originally 12/25/1986)   INFLUENZA VACCINE  10/09/2022 (Originally 02/08/2022)   Diabetic kidney evaluation - eGFR measurement  11/17/2022   HEMOGLOBIN A1C  12/07/2022   OPHTHALMOLOGY EXAM  03/26/2023   Medicare Annual Wellness (AWV)  06/30/2023   Pneumonia Vaccine 46+ Years old  Completed   HPV VACCINES  Aged Out   Health Maintenance Health Maintenance Due  Topic Date Due   DTaP/Tdap/Td (1 - Tdap) Never done   Diabetic kidney evaluation - Urine ACR  09/11/2019   FOOT EXAM  06/23/2021   Lung Cancer Screening: (Low Dose CT Chest recommended if Age 43-80 years, 30 pack-year currently smoking OR have quit w/in 15years.) does not qualify.   Hepatitis C Screening: does not qualify.  Vision Screening: Recommended annual ophthalmology exams for early detection of glaucoma and other disorders of the eye.  Dental Screening: Recommended annual dental exams for proper oral hygiene.  Community Resource Referral / Chronic Care Management: CRR required this visit?   No   CCM required this visit?  No      Plan:   Unseen mychart note for xray results, noted by PCP is read to patient per request. Patient verbalizes understanding.   I have personally reviewed and noted the following in the patient's chart:   Medical and social history Use of alcohol, tobacco or illicit drugs  Current medications and supplements including opioid prescriptions. Patient is not currently taking opioid prescriptions. Functional  ability and status Nutritional status Physical activity Advanced directives List of other physicians Hospitalizations, surgeries, and ER visits in previous 12 months Vitals Screenings to include cognitive, depression, and falls Referrals and appointments  In addition, I have reviewed and discussed with patient certain preventive protocols, quality metrics, and best practice recommendations. A written personalized care plan for preventive services as well as general preventive health recommendations were provided to patient.     Leta Jungling, LPN   [id number]

## 2022-06-29 NOTE — Patient Instructions (Addendum)
[name] , Thank you for taking time to come for your Medicare Wellness Visit. I appreciate your ongoing commitment to your health goals. Please review the following plan we discussed and let me know if I can assist you in the future.   These are the goals we discussed:  Goals Addressed             This Visit's Progress    Increase physical activity       Walking more         This is a list of the screening recommended for you and due dates:  Health Maintenance  Topic Date Due   DTaP/Tdap/Td vaccine (1 - Tdap) Never done   Yearly kidney health urinalysis for diabetes  09/11/2019   Complete foot exam   06/23/2021   COVID-19 Vaccine (3 - 2023-24 season) 07/15/2022*   Zoster (Shingles) Vaccine (1 of 2) 09/28/2022*   Flu Shot  10/09/2022*   Yearly kidney function blood test for diabetes  11/17/2022   Hemoglobin A1C  12/07/2022   Eye exam for diabetics  03/26/2023   Medicare Annual Wellness Visit  06/30/2023   Pneumonia Vaccine  Completed   HPV Vaccine  Aged Out  *Topic was postponed. The date shown is not the original due date.   Advanced directives: End of life planning; Advance aging; Advanced directives discussed.  Copy of current HCPOA/Living Will requested.    Conditions/risks identified: none new  Next appointment: Follow up in one year for your annual wellness visit.   Preventive Care 63 Years and Older, Male  Preventive care refers to lifestyle choices and visits with your health care provider that can promote health and wellness. What does preventive care include? A yearly physical exam. This is also called an annual well check. Dental exams once or twice a year. Routine eye exams. Ask your health care provider how often you should have your eyes checked. Personal lifestyle choices, including: Daily care of your teeth and gums. Regular physical activity. Eating a healthy diet. Avoiding tobacco and drug use. Limiting alcohol use. Practicing safe sex. Taking  low doses of aspirin every day. Taking vitamin and mineral supplements as recommended by your health care provider. What happens during an annual well check? The services and screenings done by your health care provider during your annual well check will depend on your age, overall health, lifestyle risk factors, and family history of disease. Counseling  Your health care provider may ask you questions about your: Alcohol use. Tobacco use. Drug use. Emotional well-being. Home and relationship well-being. Sexual activity. Eating habits. History of falls. Memory and ability to understand (cognition). Work and work Statistician. Screening  You may have the following tests or measurements: Height, weight, and BMI. Blood pressure. Lipid and cholesterol levels. These may be checked every 5 years, or more frequently if you are over 20 years old. Skin check. Lung cancer screening. You may have this screening every year starting at age 67 if you have a 30-pack-year history of smoking and currently smoke or have quit within the past 15 years. Fecal occult blood test (FOBT) of the stool. You may have this test every year starting at age 46. Flexible sigmoidoscopy or colonoscopy. You may have a sigmoidoscopy every 5 years or a colonoscopy every 10 years starting at age 72. Prostate cancer screening. Recommendations will vary depending on your family history and other risks. Hepatitis C blood test. Hepatitis B blood test. Sexually transmitted disease (STD) testing. Diabetes screening. This  is done by checking your blood sugar (glucose) after you have not eaten for a while (fasting). You may have this done every 1-3 years. Abdominal aortic aneurysm (AAA) screening. You may need this if you are a current or former smoker. Osteoporosis. You may be screened starting at age 20 if you are at high risk. Talk with your health care provider about your test results, treatment options, and if necessary, the  need for more tests. Vaccines  Your health care provider may recommend certain vaccines, such as: Influenza vaccine. This is recommended every year. Tetanus, diphtheria, and acellular pertussis (Tdap, Td) vaccine. You may need a Td booster every 10 years. Zoster vaccine. You may need this after age 73. Pneumococcal 13-valent conjugate (PCV13) vaccine. One dose is recommended after age 13. Pneumococcal polysaccharide (PPSV23) vaccine. One dose is recommended after age 83. Talk to your health care provider about which screenings and vaccines you need and how often you need them. This information is not intended to replace advice given to you by your health care provider. Make sure you discuss any questions you have with your health care provider. Document Released: 07/24/2015 Document Revised: 03/16/2016 Document Reviewed: 04/28/2015 Elsevier Interactive Patient Education  2017 [place] Prevention in the Home Falls can cause injuries. They can happen to people of all ages. There are many things you can do to make your home safe and to help prevent falls. What can I do on the outside of my home? Regularly fix the edges of walkways and driveways and fix any cracks. Remove anything that might make you trip as you walk through a door, such as a raised step or threshold. Trim any bushes or trees on the path to your home. Use bright outdoor lighting. Clear any walking paths of anything that might make someone trip, such as rocks or tools. Regularly check to see if handrails are loose or broken. Make sure that both sides of any steps have handrails. Any raised decks and porches should have guardrails on the edges. Have any leaves, snow, or ice cleared regularly. Use sand or salt on walking paths during winter. Clean up any spills in your garage right away. This includes oil or grease spills. What can I do in the bathroom? Use night lights. Install grab bars by the toilet and in the tub  and shower. Do not use towel bars as grab bars. Use non-skid mats or decals in the tub or shower. If you need to sit down in the shower, use a plastic, non-slip stool. Keep the floor dry. Clean up any water that spills on the floor as soon as it happens. Remove soap buildup in the tub or shower regularly. Attach bath mats securely with double-sided non-slip rug tape. Do not have throw rugs and other things on the floor that can make you trip. What can I do in the bedroom? Use night lights. Make sure that you have a light by your bed that is easy to reach. Do not use any sheets or blankets that are too big for your bed. They should not hang down onto the floor. Have a firm chair that has side arms. You can use this for support while you get dressed. Do not have throw rugs and other things on the floor that can make you trip. What can I do in the kitchen? Clean up any spills right away. Avoid walking on wet floors. Keep items that you use a lot in easy-to-reach places. If you  need to reach something above you, use a strong step stool that has a grab bar. Keep electrical cords out of the way. Do not use floor polish or wax that makes floors slippery. If you must use wax, use non-skid floor wax. Do not have throw rugs and other things on the floor that can make you trip. What can I do with my stairs? Do not leave any items on the stairs. Make sure that there are handrails on both sides of the stairs and use them. Fix handrails that are broken or loose. Make sure that handrails are as long as the stairways. Check any carpeting to make sure that it is firmly attached to the stairs. Fix any carpet that is loose or worn. Avoid having throw rugs at the top or bottom of the stairs. If you do have throw rugs, attach them to the floor with carpet tape. Make sure that you have a light switch at the top of the stairs and the bottom of the stairs. If you do not have them, ask someone to add them for  you. What else can I do to help prevent falls? Wear shoes that: Do not have high heels. Have rubber bottoms. Are comfortable and fit you well. Are closed at the toe. Do not wear sandals. If you use a stepladder: Make sure that it is fully opened. Do not climb a closed stepladder. Make sure that both sides of the stepladder are locked into place. Ask someone to hold it for you, if possible. Clearly mark and make sure that you can see: Any grab bars or handrails. First and last steps. Where the edge of each step is. Use tools that help you move around (mobility aids) if they are needed. These include: Canes. Walkers. Scooters. Crutches. Turn on the lights when you go into a dark area. Replace any light bulbs as soon as they burn out. Set up your furniture so you have a clear path. Avoid moving your furniture around. If any of your floors are uneven, fix them. If there are any pets around you, be aware of where they are. Review your medicines with your doctor. Some medicines can make you feel dizzy. This can increase your chance of falling. Ask your doctor what other things that you can do to help prevent falls. This information is not intended to replace advice given to you by your health care provider. Make sure you discuss any questions you have with your health care provider. Document Released: 04/23/2009 Document Revised: 12/03/2015 Document Reviewed: 08/01/2014 Elsevier Interactive Patient Education  2017 [employer].

## 2022-07-06 DIAGNOSIS — J0100 Acute maxillary sinusitis, unspecified: Secondary | ICD-10-CM | POA: Insufficient documentation

## 2022-07-06 NOTE — Assessment & Plan Note (Signed)
Return for CXR in 2 weeks for eval for resolution.

## 2022-07-06 NOTE — Assessment & Plan Note (Signed)
Start Flonase 2 spray per nostril daily and nasal saline irrigation.  Start plain Mucinex or Mucinex DM.  Complete antibiotics x 10 days.

## 2022-07-13 ENCOUNTER — Other Ambulatory Visit: Payer: Self-pay | Admitting: Physician Assistant

## 2022-08-15 ENCOUNTER — Telehealth: Payer: Self-pay | Admitting: Cardiovascular Disease

## 2022-08-15 NOTE — Telephone Encounter (Signed)
Pt c/o Shortness Of Breath: STAT if SOB developed within the last 24 hours or pt is noticeably SOB on the phone  1. Are you currently SOB (can you hear that pt is SOB on the phone)? no  2. How long have you been experiencing SOB? month  3. Are you SOB when sitting or when up moving around? Moving around   4. Are you currently experiencing any other symptoms? Slight chest pain    Pt c/o BP issue: STAT if pt c/o blurred vision, one-sided weakness or slurred speech  1. What are your last 5 BP readings? Top number in 100s and bottom in the 56s  2. Are you having any other symptoms (ex. Dizziness, headache, blurred vision, passed out)? Dizziness, headache, buried vision   3. What is your BP issue? All over the place     Schd patient for 2/12 with [place]

## 2022-08-15 NOTE — Telephone Encounter (Signed)
Spoke with the patient,he is experiencing shortness of breath upon exertion and mild headache. He has chest discomfort but it has increased in the past week.  Last blood pressure reading was taken on 2/5 around lunch time 135/72, pulse 62. Patient stated he stop taking his blood pressure medications due to low bp and lasix because he felt it wasn't helping. He did not contact his PCP prior to stopping these medications. He denies any swelling , blurred  vision, and numbness. Patient stated he is type 2 diabetic but he only checks his blood sugar when he remembers. Doesn't drink much water 16 oz a day however, he does drink lemonade and coffee. Educated patient on the importance of taking medications as prescribed. Advised patient to start taking his lasix, scheduled appointment with APP on 2/8. Advised patient to seek medical attention immediately if develop new or worsening symptoms. Patient voiced understanding

## 2022-08-16 NOTE — Telephone Encounter (Signed)
Spoke with the patient and explained the reason why his appointment Oda Kilts, PA was cancelled. Patient does have an scheduled appointment with APP on 2/12, would liked to be seen sooner. Patient symptoms haven't worsen. Advised to the patient if he developed new or worsening symptoms to go the nearest emergency room or call 911. Patient stated he will not go to the ED and wait all day. Recommended urgent care , patient stated he does not have time to wait two hours or more to be seen at urgent care. Once again, explained ED precautions. Patient voiced understanding.

## 2022-08-18 ENCOUNTER — Ambulatory Visit: Payer: Medicare HMO | Admitting: Student

## 2022-08-19 NOTE — Progress Notes (Unsigned)
Cardiology Office Note:    Date:  08/19/2022   ID:  [name], DOB 08-01-1934, MRN [place]:[id number]  PCP:  Jinny Sanders, MD   [facility] Providers Cardiologist:  Lauree Chandler, MD { Click to update primary MD,subspecialty MD or APP then REFRESH:1}    Referring MD: Jinny Sanders, MD   Chief Complaint: ***  History of Present Illness:    [name] is a *** 88 y.o. male with a hx of CAD s/p CABG 2016, coronary stent placement prior to CABG, HTN, HLD, PAF, and GERD  He has been followed for CAD since 2005 when he had stent to LAD.  He was evaluated April 2016 for chest pain.  Cardiac catheterization April 2016 with multivessel disease with severe 95% distal left main stenosis.  He underwent 4V CABG (LIMA-LAD, SVG-DX, SVG-PDA, SVG-OM) April 2016.  Echo April 2017 with normal LV function and no significant valvular disease.  Seen in July 2019 for chest pain, palpitations, and uncontrolled blood pressure.  Nuclear stress test July 2019 with no ischemia.  Echo July 2019 with LVEF 60 to 65% with grade 1 diastolic dysfunction, mild MR.  Cardiac monitor July 2019 with type II second-degree AV block and junctional bradycardia with frequent PACs.  His beta-blocker was stopped.  He felt better after stopping his beta-blocker.  He had trouble speaking in December 2020 and EMS strips showed sinus with PACs.  EKG in ED was sinus.  Echo February 2020 with LVEF 60 to 65%, mild LVH, no significant valve disease.  Cardiac monitor March 2020 with sinus, Mobitz 1 block, no atrial fibs.  He was admitted to [facility] June 2022 with COVID-pneumonia.  He had atrial fibs while admitted.  Plavix was stopped and Eliquis was started.  Echo June 2022 with LVEF 70 to 75%, moderate LVH, normal RV function, trivial MR, trivial AI.  Last cardiology clinic visit was 09/21/2021 with Dr. Angelena Form. He reported snoring and daytime somnolence but did not want to schedule a sleep study. He was in a fib with well controlled  ventricular rate.  He contacted our office on 08/15/22 to report shortness of breath when moving around for approximately one month along with slight chest pain.  Reported BP was labile.  Also having dizziness, headache, and blurred vision.  Was scheduled for office visit. Reported to triage nurse that he had stopped taking his BP meds and Lasix. She advised him to restart Lasix and ER precautions were advised.   Today, he is here for evaluation of symptoms as stated above.   Past Medical History:  Diagnosis Date   CAD (coronary artery disease)    s/p stent mid LAD 2005 // s/p CABG 2016 // Myoview 8/19:  EF 64, no ischemia   Coronary artery disease involving native coronary artery of native heart without angina pectoris    Depression    DJD (degenerative joint disease)    DM type 2 with diabetic peripheral neuropathy ([place]) 03/10/2017   E coli bacteremia 04/20/2016   Essential hypertension 04/20/2016   GERD (gastroesophageal reflux disease)    History of echocardiogram    Echo 8/19: Moderate LVH, EF 60-65, normal wall motion, grade 1 diastolic dysfunction, mild MR, mild LAE, normal RVSF, mild TR, PASP 25   History of hiatal hernia    HTN (hypertension)    Hyperlipidemia    Neuropathy due to type 2 diabetes mellitus ([place]) 03/10/2017   PERCUTANEOUS TRANSLUMINAL CORONARY ANGIOPLASTY, HX OF 11/26/2007   Annotation: With  CYPHER STENT. Qualifier: Diagnosis of  By: Danny Lawless CMA, [place]     Pre-diabetes    S/P CABG x 4 11/03/2014   S/P total knee replacement 04/11/2016   Sepsis due to Escherichia coli (E. coli) ([place]) 04/20/2016   Tinea pedis 09/08/2017    Past Surgical History:  Procedure Laterality Date   BRAIN SURGERY     CARDIAC CATHETERIZATION     CORONARY ARTERY BYPASS GRAFT N/A 11/03/2014   Procedure: CORONARY ARTERY BYPASS GRAFTING (CABG), ON PUMP, TIMES FOUR, USING LEFT INTERNAL MAMMARY, RIGHT GREATER SAPHENOUS VEIN HARVESTED ENDOSCOPICALLY;  Surgeon: Ivin Poot, MD;  Location: [place];   Service: Open Heart Surgery;  Laterality: N/A;  LIMA-LAD; SVG-DIAG; SVG-OM; SVG-RCA   CRANIOTOMY N/A 01/18/2014   Procedure: CRANIOTOMY HEMATOMA EVACUATION SUBDURAL;  Surgeon: Hosie Spangle, MD;  Location: [place];  Service: Neurosurgery;  Laterality: N/A;   kidney stone removal     KNEE SURGERY     LEFT HEART CATHETERIZATION WITH CORONARY ANGIOGRAM N/A 07/27/2011   Procedure: LEFT HEART CATHETERIZATION WITH CORONARY ANGIOGRAM;  Surgeon: Burnell Blanks, MD;  Location: [facility] CATH LAB;  Service: Cardiovascular;  Laterality: N/A;   LEFT HEART CATHETERIZATION WITH CORONARY ANGIOGRAM N/A 10/31/2014   Procedure: LEFT HEART CATHETERIZATION WITH CORONARY ANGIOGRAM;  Surgeon: Sherren Mocha, MD;  Location: [facility] C[facility];  Service: Cardiovascular;  Laterality: N/A;   PTCA     Hx of it.    SIGMOIDOSCOPY     TEE WITHOUT CARDIOVERSION N/A 11/03/2014   Procedure: TRANSESOPHAGEAL ECHOCARDIOGRAM (TEE);  Surgeon: Ivin Poot, MD;  Location: [place];  Service: Open Heart Surgery;  Laterality: N/A;   TOTAL KNEE ARTHROPLASTY  02/2011   Right knee   TOTAL KNEE ARTHROPLASTY Left 04/11/2016   Procedure: LEFT TOTAL KNEE ARTHROPLASTY;  Surgeon: Vickey Huger, MD;  Location: [place];  Service: Orthopedics;  Laterality: Left;    Current Medications: No outpatient medications have been marked as taking for the 08/22/22 encounter (Appointment) with Ann Maki, Lanice Schwab, NP.     Allergies:   Protamine, Statins, Adhesive [tape], Imodium [loperamide], and Rosuvastatin calcium   Social History   Socioeconomic History   Marital status: Married    Spouse name: Not on file   Number of children: Not on file   Years of education: Not on file   Highest education level: Not on file  Occupational History   Not on file  Tobacco Use   Smoking status: Former   Smokeless tobacco: Former   Tobacco comments:    quit in 1985. smoked 1ppd for 35 years   Vaping Use   Vaping Use: Never used  Substance and Sexual Activity    Alcohol use: No   Drug use: No   Sexual activity: Never  Other Topics Concern   Not on file  Social History Narrative   Married with children. 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 10:20 am    Has living will,  HCPOA: jerrie Wheeless, full code (reviewed 27)   Social Determinants of Health   Financial Resource Strain: Low Risk  (06/22/2021)   Overall Financial Resource Strain (CARDIA)    Difficulty of Paying Living Expenses: Not hard at all  Food Insecurity: No Food Insecurity (06/22/2021)   Hunger Vital Sign    Worried About Running Out of Food in the Last Year: Never true    Ran Out of Food in the Last Year: Never true  Transportation Needs: No Transportation Needs (06/22/2021)  PRAPARE - Hydrologist (Medical): No    Lack of Transportation (Non-Medical): No  Physical Activity: Inactive (06/22/2021)   Exercise Vital Sign    Days of Exercise per Week: 0 days    Minutes of Exercise per Session: 0 min  Stress: No Stress Concern Present (06/22/2021)   [place]    Feeling of Stress : Not at all  Social Connections: [place] (06/22/2021)   Social Connection and Isolation Panel [NHANES]    Frequency of Communication with Friends and Family: More than three times a week    Frequency of Social Gatherings with Friends and Family: Three times a week    Attends Religious Services: More than 4 times per year    Active Member of Clubs or Organizations: Yes    Attends Music therapist: More than 4 times per year    Marital Status: Married     Family History: The patient's ***family history includes COPD in his mother; Heart attack in his brother, father, and sister; Heart disease in his father and sister; Heart failure in his mother.  ROS:   Please see the history of present illness.    *** All other systems reviewed and are  negative.  Labs/Other Studies Reviewed:    The following studies were reviewed today:  Echo 12/31/20 1. Left ventricular ejection fraction, by estimation, is 70 to 75%. The  left ventricle has hyperdynamic function. The left ventricle has no  regional wall motion abnormalities. There is moderate left ventricular  hypertrophy. Left ventricular diastolic  function could not be evaluated.   2. Right ventricular systolic function is normal. The right ventricular  size is normal. There is normal pulmonary artery systolic pressure.   3. The mitral valve is normal in structure. Trivial mitral valve  regurgitation. No evidence of mitral stenosis.   4. The aortic valve is tricuspid. Aortic valve regurgitation is trivial.  Mild aortic valve sclerosis is present, with no evidence of aortic valve  stenosis.   5. The inferior vena cava is normal in size with greater than 50%  respiratory variability, suggesting right atrial pressure of 3 mmHg.   Cardiac monitor 10/09/19 Sinus rhythm with sinus bradycardia Second degree AV block (Mobitz 1) No evidence of atrial fibrillation  Lexiscan Myoview 02/15/18 Nuclear stress EF: 64% with septal wall hypokinesis There was no ST segment deviation noted during stress. The study is normal. This is a low risk study. No ischemia identified.   LHC 10/31/14 Final Conclusions:   1. Severe distal left main stenosis 2. Patent LAD stent with severe diffuse mid and distal LAD stenosis 3. Diffuse nonobstructive left circumflex and RCA stenoses 4. Normal LV systolic function   Recommendations: Hospital admission to a cardiac stepdown bed, IV heparin, cardiac surgery consultation. The patient has high risk coronary anatomy with severe distal left main stem stenosis. He has not had any resting anginal symptoms, that clearly his anatomy warrants at hospital admission, IV heparin for anticoagulation, and revascularization while he is in-house.    Recent  Labs: 09/16/2021: Hemoglobin 13.4; Platelets 402.0; Pro B Natriuretic peptide (BNP) 39.0 11/16/2021: ALT 17; BUN 20; Creatinine, Ser 0.79; Potassium 3.3; Sodium 144  Recent Lipid Panel    Component Value Date/Time   CHOL 143 11/16/2021 0906   CHOL 122 09/22/2016 0912   TRIG 64.0 11/16/2021 0906   HDL 39.10 11/16/2021 0906   HDL 39 (L) 09/22/2016 0912   CHOLHDL  4 11/16/2021 0906   VLDL 12.8 11/16/2021 0906   LDLCALC 92 11/16/2021 0906   LDLCALC 63 09/22/2016 0912     Risk Assessment/Calculations:   {Does this patient have ATRIAL FIBRILLATION?[phone]}       Physical Exam:    VS:  There were no vitals taken for this visit.    Wt Readings from Last 3 Encounters:  06/29/22 179 lb (81.2 kg)  06/22/22 179 lb 8 oz (81.4 kg)  06/08/22 181 lb (82.1 kg)     GEN: *** Well nourished, well developed in no acute distress HEENT: Normal NECK: No JVD; No carotid bruits CARDIAC: ***RRR, no murmurs, rubs, gallops RESPIRATORY:  Clear to auscultation without rales, wheezing or rhonchi  ABDOMEN: Soft, non-tender, non-distended MUSCULOSKELETAL:  No edema; No deformity. *** pedal pulses, ***bilaterally SKIN: Warm and dry NEUROLOGIC:  Alert and oriented x 3 PSYCHIATRIC:  Normal affect   EKG:  EKG is *** ordered today.  The ekg ordered today demonstrates ***  No BP recorded.  {Refresh Note OR Click here to enter BP  :1}***    Diagnoses:    No diagnosis found. Assessment and Plan:     Shortness of breath: CAD: Hyperlipidemia LDL goal < 55: Hypertension:  {Are you ordering a CV Procedure (e.g. stress test, cath, DCCV, TEE, etc)?   Press F2        :[id number]   Disposition:  Medication Adjustments/Labs and Tests Ordered: Current medicines are reviewed at length with the patient today.  Concerns regarding medicines are outlined above.  No orders of the defined types were placed in this encounter.  No orders of the defined types were placed in this encounter.   There are no  Patient Instructions on file for this visit.   Signed, Emmaline Life, NP  08/19/2022 7:10 AM    [place]

## 2022-08-22 ENCOUNTER — Encounter: Payer: Self-pay | Admitting: Nurse Practitioner

## 2022-08-22 ENCOUNTER — Ambulatory Visit: Payer: Medicare HMO | Attending: Nurse Practitioner | Admitting: Nurse Practitioner

## 2022-08-22 VITALS — BP 114/70 | HR 72 | Ht 67.0 in | Wt 177.0 lb

## 2022-08-22 DIAGNOSIS — I25118 Atherosclerotic heart disease of native coronary artery with other forms of angina pectoris: Secondary | ICD-10-CM | POA: Diagnosis not present

## 2022-08-22 DIAGNOSIS — E785 Hyperlipidemia, unspecified: Secondary | ICD-10-CM | POA: Diagnosis not present

## 2022-08-22 DIAGNOSIS — I2510 Atherosclerotic heart disease of native coronary artery without angina pectoris: Secondary | ICD-10-CM | POA: Diagnosis not present

## 2022-08-22 DIAGNOSIS — I4811 Longstanding persistent atrial fibrillation: Secondary | ICD-10-CM

## 2022-08-22 DIAGNOSIS — R5383 Other fatigue: Secondary | ICD-10-CM

## 2022-08-22 DIAGNOSIS — R0602 Shortness of breath: Secondary | ICD-10-CM | POA: Diagnosis not present

## 2022-08-22 DIAGNOSIS — D6869 Other thrombophilia: Secondary | ICD-10-CM

## 2022-08-22 DIAGNOSIS — R0609 Other forms of dyspnea: Secondary | ICD-10-CM | POA: Diagnosis not present

## 2022-08-22 DIAGNOSIS — Z87898 Personal history of other specified conditions: Secondary | ICD-10-CM

## 2022-08-22 DIAGNOSIS — Z951 Presence of aortocoronary bypass graft: Secondary | ICD-10-CM

## 2022-08-22 MED ORDER — VALSARTAN 40 MG PO TABS: 20.0000 mg | ORAL_TABLET | Freq: Every day | ORAL | 3 refills | Status: DC

## 2022-08-22 NOTE — Patient Instructions (Addendum)
Medication Instructions:  STOP Norvasc  STOP Lisinopril  START Valsartan 62m Take half tablet (231m daily  *If you need a refill on your cardiac medications before your next appointment, please call your pharmacy*   Lab Work: TODAY-CBC, CMET, LIPID & TSH If you have labs (blood work) drawn today and your tests are completely normal, you will receive your results only by: [place] you have MyChart) OR A paper copy in the mail If you have any lab test that is abnormal or we need to change your treatment, we will call you to review the results.   Testing/Procedures: Your physician has requested that you have an echocardiogram. Echocardiography is a painless test that uses sound waves to create images of your heart. It provides your doctor with information about the size and shape of your heart and how well your heart's chambers and valves are working. This procedure takes approximately one hour. There are no restrictions for this procedure. Please do NOT wear cologne, perfume, aftershave, or lotions (deodorant is allowed). Please arrive 15 minutes prior to your appointment time.   Follow-Up: [facility] are our priority.  As part of our continuing mission to provide you with exceptional heart care, we have created designated Provider Care Teams.  These Care Teams include your primary Cardiologist (physician) and Advanced Practice Providers (APPs -  Physician Assistants and Nurse Practitioners) who all work together to provide you with the care you need, when you need it.  We recommend signing up for the patient portal called "MyChart".  Sign up information is provided on this After Visit Summary.  MyChart is used to connect with patients for Virtual Visits (Telemedicine).  Patients are able to view lab/test results, encounter notes, upcoming appointments, etc.  Non-urgent messages can be sent to your provider as well.   To learn more about  what you can do with MyChart, go to [email or web address]   Your next appointment:   3 month(s)  Provider:   MiChristen BameNP     Then, ChLauree ChandlerMD will plan to see you again in 3 month(s).    Other Instructions How to Prepare for Your Cardiac PET/CT Stress Test:  1. Please do not take these medications before your test:   Medications that may interfere with the cardiac pharmacological stress agent (ex. nitrates - including erectile dysfunction medications, isosorbide mononitrate, tamulosin or beta-blockers) the day of the exam. (Erectile dysfunction medication should be held for at least 72 hrs prior to test) Theophylline containing medications for 12 hours. Dipyridamole 48 hours prior to the test. Your remaining medications may be taken with water.  2. Nothing to eat or drink, except water, 3 hours prior to arrival time.   NO caffeine/decaffeinated products, or chocolate 12 hours prior to arrival.  3. NO perfume, cologne or lotion  4. Total time is 1 to 2 hours; you may want to bring reading material for the waiting time.  5. [facility] 30 minutes early for your test.  [place] [id number] Preparation:  Hold oral medications. You may take NPH and Lantus insulin. Do not take Humalog or Humulin R (Regular Insulin) the day of your test. Check blood sugars prior to leaving the house. If able to eat breakfast prior to 3 hour fasting, you may take all medications, including your insulin, Do not worry if you miss your  breakfast dose of insulin - start at your next meal.   In preparation for your appointment, medication and supplies will be purchased.  Appointment availability is limited, so if you need to cancel or reschedule, please call the Radiology Department at [phone]  24 hours in advance to avoid a cancellation fee of $100.00  What to Expect After you  Arrive:  Once you arrive and check in for your appointment, you will be taken to a preparation room within the Radiology Department.  A technologist or Nurse will obtain your medical history, verify that you are correctly prepped for the exam, and explain the procedure.  Afterwards,  an IV will be started in your arm and electrodes will be placed on your skin for EKG monitoring during the stress portion of the exam. Then you will be escorted to the PET/CT scanner.  There, staff will get you positioned on the scanner and obtain a blood pressure and EKG.  During the exam, you will continue to be connected to the EKG and blood pressure machines.  A small, safe amount of a radioactive tracer will be injected in your IV to obtain a series of pictures of your heart along with an injection of a stress agent.    After your Exam:  It is recommended that you eat a meal and drink a caffeinated beverage to counter act any effects of the stress agent.  Drink plenty of fluids for the remainder of the day and urinate frequently for the first couple of hours after the exam.  Your doctor will inform you of your test results within 7-10 business days.  For questions about your test or how to prepare for your test, please call: Marchia Bond, Cardiac Imaging Nurse Navigator  Gordy Clement, Cardiac Imaging Nurse Navigator Office: [phone]

## 2022-08-23 LAB — COMPREHENSIVE METABOLIC PANEL
ALT: 12 IU/L (ref 0–44)
AST: 13 IU/L (ref 0–40)
Albumin/Globulin Ratio: 2.2 (ref 1.2–2.2)
Albumin: 4.4 g/dL (ref 3.7–4.7)
Alkaline Phosphatase: 68 IU/L (ref 44–121)
BUN/Creatinine Ratio: 22 (ref 10–24)
BUN: 19 mg/dL (ref 8–27)
Bilirubin Total: 0.3 mg/dL (ref 0.0–1.2)
CO2: 22 mmol/L (ref 20–29)
Calcium: 9.2 mg/dL (ref 8.6–10.2)
Chloride: 107 mmol/L — ABNORMAL HIGH (ref 96–106)
Creatinine, Ser: 0.87 mg/dL (ref 0.76–1.27)
Globulin, Total: 2 g/dL (ref 1.5–4.5)
Glucose: 121 mg/dL — ABNORMAL HIGH (ref 70–99)
Potassium: 4.7 mmol/L (ref 3.5–5.2)
Sodium: 141 mmol/L (ref 134–144)
Total Protein: 6.4 g/dL (ref 6.0–8.5)
eGFR: 85 mL/min/{1.73_m2} (ref >59)

## 2022-08-23 LAB — LIPID PANEL
Chol/HDL Ratio: 3.5 ratio (ref 0.0–5.0)
Cholesterol, Total: 148 mg/dL (ref 100–199)
HDL: 42 mg/dL (ref >39)
LDL Chol Calc (NIH): 94 mg/dL (ref 0–99)
Triglycerides: 55 mg/dL (ref 0–149)
VLDL Cholesterol Cal: 12 mg/dL (ref 5–40)

## 2022-08-23 LAB — CBC
Hematocrit: 27.1 % — ABNORMAL LOW (ref 37.5–51.0)
Hemoglobin: 8.2 g/dL — ABNORMAL LOW (ref 13.0–17.7)
MCH: 24 pg — ABNORMAL LOW (ref 26.6–33.0)
MCHC: 30.3 g/dL — ABNORMAL LOW (ref 31.5–35.7)
MCV: 80 fL (ref 79–97)
Platelets: 326 10*3/uL (ref 150–450)
RBC: 3.41 x10E6/uL — ABNORMAL LOW (ref 4.14–5.80)
RDW: 15.6 % — ABNORMAL HIGH (ref 11.6–15.4)
WBC: 8.2 10*3/uL (ref 3.4–10.8)

## 2022-08-23 LAB — TSH: TSH: 1.81 u[IU]/mL (ref 0.450–4.500)

## 2022-08-24 ENCOUNTER — Telehealth: Payer: Self-pay

## 2022-08-24 NOTE — Telephone Encounter (Signed)
I spoke with patient yesterday afternoon and got him scheduled to see Dr. Diona Browner on 08/25/2022 at 2:40 pm for anemia.  Other that that,  I am not sure who called him.

## 2022-08-24 NOTE — Telephone Encounter (Signed)
[place] Night - Client Nonclinical Telephone Record  [facility] Client [place] Night - Client Client Site [place] - Night Provider Eliezer Lofts - MD Contact Type Call Who Is Calling Patient / Member / Family / Caregiver Caller Name [name]. [name] Phone Number [phone] Call Type Message Only Information Provided Reason for Call Returning a Call from the Office Initial [name] states that he is returning a call to the office. Additional Comment Caller would like another call back from the office since he missed the call. Disp. Time Disposition Final User 08/24/2022 1:33:20 PM General Information Provided Yes Wynema Birch Call Closed By: Wynema Birch Transaction Date/Time: 08/24/2022 1:31:30 PM (ET   Sending note to lsc support and [facility] pool.

## 2022-08-25 ENCOUNTER — Encounter: Payer: Self-pay | Admitting: Family Medicine

## 2022-08-25 ENCOUNTER — Ambulatory Visit (INDEPENDENT_AMBULATORY_CARE_PROVIDER_SITE_OTHER): Payer: Medicare HMO | Admitting: Family Medicine

## 2022-08-25 VITALS — BP 120/60 | HR 73 | Temp 97.5°F | Ht 67.0 in | Wt 179.2 lb

## 2022-08-25 DIAGNOSIS — Z7901 Long term (current) use of anticoagulants: Secondary | ICD-10-CM | POA: Diagnosis not present

## 2022-08-25 DIAGNOSIS — D500 Iron deficiency anemia secondary to blood loss (chronic): Secondary | ICD-10-CM | POA: Insufficient documentation

## 2022-08-25 DIAGNOSIS — D509 Iron deficiency anemia, unspecified: Secondary | ICD-10-CM

## 2022-08-25 MED ORDER — ALBUTEROL SULFATE HFA 108 (90 BASE) MCG/ACT IN AERS: 2.0000 | INHALATION_SPRAY | Freq: Four times a day (QID) | RESPIRATORY_TRACT | 0 refills | Status: DC | PRN

## 2022-08-25 NOTE — Patient Instructions (Signed)
Please stop at the lab to have labs drawn.  We will work on GI referral.

## 2022-08-25 NOTE — Assessment & Plan Note (Signed)
Acute, new change for patient compared to labs done in the last year. Patient anticoagulated on Eliquis 5 mg twice daily for atrial fibrillation.  Of note patient does not like Eliquis and preferred when he was on Plavix.  We spent significant time during the visit discussing the benefit of Eliquis over Plavix in this setting.  Unclear etiology of anemia, concerning for possible occult blood loss. Lip bleeding episode but this was fairly short-lived.  Of note he is also from time to time seen small drops of bright red blood thought to be from hemorrhoids on toilet tissue as well as 1 episode of darker stools.  Will reevaluate hemoglobin today as well as iron panel.  Move forward with GI referral.  Continue Eliquis for now given no clear active bleeding but if hemoglobin dropping we will old this. Of note patient does tend to be constipated so he may not tolerate oral iron if this is needed.  He may benefit more from iron infusions. He will increase iron in his diet

## 2022-08-25 NOTE — Progress Notes (Signed)
Patient ID: [name], male    DOB: Aug 06, 1934, 88 y.o.   MRN: [id number]  This visit was conducted in person.  BP 120/60   Pulse 73   Temp (!) 97.5 F (36.4 C) (Temporal)   Ht 5' 7"$  (1.702 m)   Wt 179 lb 4 oz (81.3 kg)   SpO2 98%   BMI 28.07 kg/m    CC:  Chief Complaint  Patient presents with   Anemia    Subjective:   HPI: [name] is a 88 y.o. male with history of coronary artery disease presenting on 08/25/2022 for Anemia   Reviewed recent office visit note from Christen Bame, NP cardiology He had contacted their office on February 5 to report shortness of breath with moving for approximately 1 month along with slight chest pain.  Also had noted dizziness headache and blurred vision. Evaluated with labs, echocardiogram cardiac PET/CT Held amlodipine and lisinopril and started on low-dose valsartan for blood pressure    CBC results showed hemoglobin of 8.2, low MCV  He is on Eliquis anticoagulation for A[place] Last colonoscopy 2012 showed hemorrhoids, diverticulosis.   He reports he has been feeling SOB and dizzy since COVID but gradaully worse in last  1-2 months. One epsiode of bloody lip took 10 min to stop, one episode of dark stool 1-3 months ago,  occ brb on toilet paper from hemprrhoids, very tiny amount., no blood in urine.  No bloody nose, gums.      No relevant past medical, surgical, family and social history reviewed and updated as indicated. Interim medical history since our last visit reviewed. Allergies and medications reviewed and updated. Outpatient Medications Prior to Visit  Medication Sig Dispense Refill   acetaminophen (TYLENOL) 500 MG tablet Take 500-1,000 mg by mouth 2 (two) times daily as needed (for pain).      cholecalciferol (VITAMIN D3) 25 MCG (1000 UNIT) tablet Take 1,000 Units by mouth daily.     ELIQUIS 5 MG TABS tablet Take 1 tablet by mouth twice daily 180 tablet 0   furosemide (LASIX) 20 MG tablet Take 1 tablet (20 mg  total) by mouth daily. 30 tablet 3   isosorbide mononitrate (IMDUR) 30 MG 24 hr tablet Take 1 tablet by mouth once daily 90 tablet 0   nitroGLYCERIN (NITROSTAT) 0.4 MG SL tablet DISSOLVE ONE TABLET UNDER THE TONGUE EVERY 5 MINUTES AS NEEDED FOR CHEST PAIN.  DO NOT EXCEED A TOTAL OF 3 DOSES IN 15 MINUTES 25 tablet 2   valsartan (DIOVAN) 40 MG tablet Take 0.5 tablets (20 mg total) by mouth daily. 45 tablet 3   vitamin B-12 (CYANOCOBALAMIN) 1000 MCG tablet Take 1,000 mcg by mouth daily.     albuterol (VENTOLIN HFA) 108 (90 Base) MCG/ACT inhaler Inhale 2 puffs into the lungs every 6 (six) hours as needed for wheezing or shortness of breath. 18 g 0   No facility-administered medications prior to visit.     Per HPI unless specifically indicated in ROS section below Review of Systems  Constitutional:  Negative for fatigue and fever.  HENT:  Negative for ear pain.   Eyes:  Negative for pain.  Respiratory:  Negative for cough and shortness of breath.   Cardiovascular:  Negative for chest pain, palpitations and leg swelling.  Gastrointestinal:  Negative for abdominal pain.  Genitourinary:  Negative for dysuria.  Musculoskeletal:  Negative for arthralgias.  Neurological:  Negative for syncope, light-headedness and headaches.  Psychiatric/Behavioral:  Negative for dysphoric  mood.    Objective:  BP 120/60   Pulse 73   Temp (!) 97.5 F (36.4 C) (Temporal)   Ht 5' 7"$  (1.702 m)   Wt 179 lb 4 oz (81.3 kg)   SpO2 98%   BMI 28.07 kg/m   Wt Readings from Last 3 Encounters:  08/25/22 179 lb 4 oz (81.3 kg)  08/22/22 177 lb (80.3 kg)  06/29/22 179 lb (81.2 kg)      Physical Exam Constitutional:      Appearance: He is well-developed.  HENT:     Head: Normocephalic.     Right Ear: Hearing normal.     Left Ear: Hearing normal.     Nose: Nose normal.  Neck:     Thyroid: No thyroid mass or thyromegaly.     Vascular: No carotid bruit.     Trachea: Trachea normal.  Cardiovascular:     Rate and  Rhythm: Normal rate and regular rhythm.     Pulses: Normal pulses.     Heart sounds: Heart sounds not distant. No murmur heard.    No friction rub. No gallop.     Comments: No peripheral edema Pulmonary:     Effort: Pulmonary effort is normal. No respiratory distress.     Breath sounds: Normal breath sounds.  Skin:    General: Skin is warm and dry.     Findings: No rash.  Psychiatric:        Speech: Speech normal.        Behavior: Behavior normal.        Thought Content: Thought content normal.       Results for orders placed or performed in visit on 08/25/22  HM DIABETES EYE EXAM  Result Value Ref Range   HM Diabetic Eye Exam No Retinopathy No Retinopathy    Assessment and Plan  Microcytic anemia Assessment & Plan: Acute, new change for patient compared to labs done in the last year. Patient anticoagulated on Eliquis 5 mg twice daily for atrial fibrillation.  Of note patient does not like Eliquis and preferred when he was on Plavix.  We spent significant time during the visit discussing the benefit of Eliquis over Plavix in this setting.  Unclear etiology of anemia, concerning for possible occult blood loss. Lip bleeding episode but this was fairly short-lived.  Of note he is also from time to time seen small drops of bright red blood thought to be from hemorrhoids on toilet tissue as well as 1 episode of darker stools.  Will reevaluate hemoglobin today as well as iron panel.  Move forward with GI referral.  Continue Eliquis for now given no clear active bleeding but if hemoglobin dropping we will old this. Of note patient does tend to be constipated so he may not tolerate oral iron if this is needed.  He may benefit more from iron infusions. He will increase iron in his diet  Orders: -     CBC with Differential/Platelet -     IBC + Ferritin -     Ambulatory referral to Gastroenterology  Anticoagulated -     Ambulatory referral to Gastroenterology  Other orders -      Albuterol Sulfate HFA; Inhale 2 puffs into the lungs every 6 (six) hours as needed for wheezing or shortness of breath.  Dispense: 18 g; Refill: 0    No follow-ups on file.   Eliezer Lofts, MD

## 2022-08-26 LAB — IBC + FERRITIN
Ferritin: 14.1 ng/mL — ABNORMAL LOW (ref 22.0–322.0)
Iron: 17 ug/dL — ABNORMAL LOW (ref 42–165)
Saturation Ratios: 3.2 % — ABNORMAL LOW (ref 20.0–50.0)
TIBC: 526.4 ug/dL — ABNORMAL HIGH (ref 250.0–450.0)
Transferrin: 376 mg/dL — ABNORMAL HIGH (ref 212.0–360.0)

## 2022-08-26 LAB — CBC WITH DIFFERENTIAL/PLATELET
Basophils Absolute: 0.1 10*3/uL (ref 0.0–0.1)
Basophils Relative: 1.1 % (ref 0.0–3.0)
Eosinophils Absolute: 0.3 10*3/uL (ref 0.0–0.7)
Eosinophils Relative: 3.9 % (ref 0.0–5.0)
HCT: 27.1 % — ABNORMAL LOW (ref 39.0–52.0)
Hemoglobin: 8.5 g/dL — ABNORMAL LOW (ref 13.0–17.0)
Lymphocytes Relative: 26.1 % (ref 12.0–46.0)
Lymphs Abs: 1.9 10*3/uL (ref 0.7–4.0)
MCHC: 31.3 g/dL (ref 30.0–36.0)
MCV: 78.3 fl (ref 78.0–100.0)
Monocytes Absolute: 0.8 10*3/uL (ref 0.1–1.0)
Monocytes Relative: 11.5 % (ref 3.0–12.0)
Neutro Abs: 4.1 10*3/uL (ref 1.4–7.7)
Neutrophils Relative %: 57.4 % (ref 43.0–77.0)
Platelets: 340 10*3/uL (ref 150.0–400.0)
RBC: 3.46 Mil/uL — ABNORMAL LOW (ref 4.22–5.81)
RDW: 17.8 % — ABNORMAL HIGH (ref 11.5–15.5)
WBC: 7.2 10*3/uL (ref 4.0–10.5)

## 2022-08-29 ENCOUNTER — Encounter: Payer: Self-pay | Admitting: Gastroenterology

## 2022-08-30 ENCOUNTER — Other Ambulatory Visit: Payer: Self-pay | Admitting: Family Medicine

## 2022-08-30 DIAGNOSIS — E1142 Type 2 diabetes mellitus with diabetic polyneuropathy: Secondary | ICD-10-CM

## 2022-09-09 ENCOUNTER — Telehealth (HOSPITAL_COMMUNITY): Payer: Self-pay | Admitting: Emergency Medicine

## 2022-09-09 NOTE — Telephone Encounter (Signed)
Reaching out to patient to offer assistance regarding upcoming cardiac imaging study; pt verbalizes understanding of appt date/time, parking situation and where to check in, pre-test NPO status and medications ordered, and verified current allergies; name and call back number provided for further questions should they arise Craig Bond RN Navigator Cardiac Imaging [name] [phone] office [phone] cell  Arrival [address]  No caffeine 12 h No food 3 h Hold imdur Denies iv issues

## 2022-09-10 ENCOUNTER — Other Ambulatory Visit: Payer: Self-pay | Admitting: Family Medicine

## 2022-09-12 MED ORDER — REGADENOSON 0.4 MG/5ML IV SOLN
0.4000 mg | Freq: Once | INTRAVENOUS | Status: AC
  Administered 2022-09-13: 0.4 mg via INTRAVENOUS

## 2022-09-12 MED ORDER — REGADENOSON 0.4 MG/5ML IV SOLN: 0.4000 mg | Freq: Once | INTRAVENOUS | Status: DC

## 2022-09-13 ENCOUNTER — Encounter (HOSPITAL_COMMUNITY)
Admission: RE | Admit: 2022-09-13 | Discharge: 2022-09-13 | Disposition: A | Discharge status: Home / Self Care | Payer: Medicare HMO | Source: Ambulatory Visit | Attending: Nurse Practitioner | Admitting: Nurse Practitioner

## 2022-09-13 DIAGNOSIS — I25118 Atherosclerotic heart disease of native coronary artery with other forms of angina pectoris: Secondary | ICD-10-CM | POA: Diagnosis not present

## 2022-09-13 DIAGNOSIS — R5383 Other fatigue: Secondary | ICD-10-CM

## 2022-09-13 DIAGNOSIS — R0609 Other forms of dyspnea: Secondary | ICD-10-CM | POA: Insufficient documentation

## 2022-09-13 MED ORDER — RUBIDIUM RB82 GENERATOR (RUBYFILL)
20.0000 | PACK | Freq: Once | INTRAVENOUS | Status: AC
  Administered 2022-09-13: 20.1 via INTRAVENOUS

## 2022-09-13 MED ORDER — RUBIDIUM RB82 GENERATOR (RUBYFILL)
20.0000 | PACK | Freq: Once | INTRAVENOUS | Status: AC
  Administered 2022-09-13: 20.2 via INTRAVENOUS

## 2022-09-13 NOTE — Progress Notes (Signed)
Tolerated test well

## 2022-09-14 LAB — NM PET CT CARDIAC PERFUSION MULTI W/ABSOLUTE BLOODFLOW
MBFR: 1.45
Nuc Rest EF: 66 %
Nuc Stress EF: 69 %
Rest MBF: 1.08 ml/g/min
Rest Nuclear Isotope Dose: 20.1 mCi
ST Depression (mm): 0 mm
Stress MBF: 1.57 ml/g/min
Stress Nuclear Isotope Dose: 20.2 mCi
TID: 0.98

## 2022-09-17 ENCOUNTER — Other Ambulatory Visit: Payer: Self-pay | Admitting: Family

## 2022-09-17 DIAGNOSIS — J90 Pleural effusion, not elsewhere classified: Secondary | ICD-10-CM

## 2022-09-20 ENCOUNTER — Ambulatory Visit (HOSPITAL_COMMUNITY): Payer: Medicare HMO | Attending: Nurse Practitioner

## 2022-09-20 DIAGNOSIS — R0609 Other forms of dyspnea: Secondary | ICD-10-CM

## 2022-09-20 DIAGNOSIS — I25118 Atherosclerotic heart disease of native coronary artery with other forms of angina pectoris: Secondary | ICD-10-CM

## 2022-09-20 DIAGNOSIS — R5383 Other fatigue: Secondary | ICD-10-CM

## 2022-09-20 LAB — ECHOCARDIOGRAM COMPLETE
Area-P 1/2: 3.62 cm2
S' Lateral: 2.4 cm

## 2022-09-20 MED FILL — Regadenoson IV Inj 0.4 MG/5ML (0.08 MG/ML): INTRAVENOUS | Qty: 5 | Status: AC

## 2022-09-24 ENCOUNTER — Other Ambulatory Visit: Payer: Self-pay | Admitting: Cardiovascular Disease

## 2022-09-30 ENCOUNTER — Encounter: Payer: Self-pay | Admitting: Gastroenterology

## 2022-09-30 ENCOUNTER — Ambulatory Visit: Payer: Medicare HMO | Admitting: Gastroenterology

## 2022-09-30 ENCOUNTER — Telehealth: Payer: Self-pay | Admitting: *Deleted

## 2022-09-30 VITALS — BP 124/60 | HR 80 | Ht 65.5 in | Wt 182.4 lb

## 2022-09-30 DIAGNOSIS — Z7901 Long term (current) use of anticoagulants: Secondary | ICD-10-CM

## 2022-09-30 DIAGNOSIS — D509 Iron deficiency anemia, unspecified: Secondary | ICD-10-CM

## 2022-09-30 MED ORDER — NA SULFATE-K SULFATE-MG SULF 17.5-3.13-1.6 GM/177ML PO SOLN: 1.0000 | Freq: Once | ORAL | 0 refills | Status: AC

## 2022-09-30 NOTE — Patient Instructions (Signed)
You have been scheduled for an endoscopy and colonoscopy. Please follow the written instructions given to you at your visit today. Please pick up your prep supplies at the pharmacy within the next 1-3 days. If you use inhalers (even only as needed), please bring them with you on the day of your procedure.  _______________________________________________________  If your blood pressure at your visit was 140/90 or greater, please contact your primary care physician to follow up on this.  _______________________________________________________  If you are age 60 or older, your body mass index should be between 23-30. Your Body mass index is 29.89 kg/m. If this is out of the aforementioned range listed, please consider follow up with your Primary Care Provider.  If you are age 9 or younger, your body mass index should be between 19-25. Your Body mass index is 29.89 kg/m. If this is out of the aformentioned range listed, please consider follow up with your Primary Care Provider.   ________________________________________________________  The [place] [facility] providers would like to encourage you to use [facility] to communicate with providers for non-urgent requests or questions.  Due to long hold times on the telephone, sending your provider a message by [facility] may be a faster and more efficient way to get a response.  Please allow 48 business hours for a response.  Please remember that this is for non-urgent requests.  _______________________________________________________

## 2022-09-30 NOTE — Telephone Encounter (Signed)
[facility] Pre-operative Risk Assessment     Request for surgical clearance:     Endoscopy Procedure  What type of surgery is being performed?     EGD/Colon  When is this surgery scheduled?     10/25/22  What type of clearance is required ?   Pharmacy  Are there any medications that need to be held prior to surgery and how long? Eliquis 2 days  Practice name and name of physician performing surgery?      [facility]  What is your office phone and fax number?      Phone- [phone]  [phone]  Anesthesia type (None, local, MAC, general) ?       MAC

## 2022-09-30 NOTE — Progress Notes (Signed)
09/30/2022 [name] [place]:[id number] 1934/08/17   HISTORY OF PRESENT ILLNESS:  This is an 88 year old male who was previously a patient of Dr. Buel Ream known him for colonoscopy in August 2012.  At that time he was found to have diverticulosis and hemorrhoids.  No colonoscopies were recommended due to his age.  He presents here to our office today with his wife at the request of Dr. Diona Browner for evaluation of iron deficiency anemia.  He was recently found to have a hemoglobin down to 8.2 and 8.5 g in February, which is down from 13.4 g in March 2023.  Iron studies are low.  He was started on ferrous sulfate 325 mg daily.  He denies any black or bloody stools.  Reports some occasional constipation which he attributes to some of his medications.  He is on Eliquis for cardiology standpoint follows with Dr. Angelena Form.   Past Medical History:  Diagnosis Date   CAD (coronary artery disease)    s/p stent mid LAD 2005 // s/p CABG 2016 // Myoview 8/19:  EF 64, no ischemia   Coronary artery disease involving native coronary artery of native heart without angina pectoris    Depression    DJD (degenerative joint disease)    DM type 2 with diabetic peripheral neuropathy ([place]) 03/10/2017   E coli bacteremia 04/20/2016   Essential hypertension 04/20/2016   GERD (gastroesophageal reflux disease)    History of echocardiogram    Echo 8/19: Moderate LVH, EF 60-65, normal wall motion, grade 1 diastolic dysfunction, mild MR, mild LAE, normal RVSF, mild TR, PASP 25   History of hiatal hernia    HTN (hypertension)    Hyperlipidemia    Neuropathy due to type 2 diabetes mellitus ([employer]) 03/10/2017   PERCUTANEOUS TRANSLUMINAL CORONARY ANGIOPLASTY, HX OF 11/26/2007   Annotation: With CYPHER STENT. Qualifier: Diagnosis of  By: Danny Lawless CMA, [place]     Pre-diabetes    S/P CABG x 4 11/03/2014   S/P total knee replacement 04/11/2016   Sepsis due to Escherichia coli (E. coli) ([place]) 04/20/2016   Tinea pedis 09/08/2017    Past Surgical History:  Procedure Laterality Date   BRAIN SURGERY     CARDIAC CATHETERIZATION     CORONARY ARTERY BYPASS GRAFT N/A 11/03/2014   Procedure: CORONARY ARTERY BYPASS GRAFTING (CABG), ON PUMP, TIMES FOUR, USING LEFT INTERNAL MAMMARY, RIGHT GREATER SAPHENOUS VEIN HARVESTED ENDOSCOPICALLY;  Surgeon: Ivin Poot, MD;  Location: [place];  Service: Open Heart Surgery;  Laterality: N/A;  LIMA-LAD; SVG-DIAG; SVG-OM; SVG-RCA   CRANIOTOMY N/A 01/18/2014   Procedure: CRANIOTOMY HEMATOMA EVACUATION SUBDURAL;  Surgeon: Hosie Spangle, MD;  Location: [place];  Service: Neurosurgery;  Laterality: N/A;   kidney stone removal     KNEE SURGERY     LEFT HEART CATHETERIZATION WITH CORONARY ANGIOGRAM N/A 07/27/2011   Procedure: LEFT HEART CATHETERIZATION WITH CORONARY ANGIOGRAM;  Surgeon: Burnell Blanks, MD;  Location[facility] CATH LAB;  Service: Cardiovascular;  Laterality: N/A;   LEFT HEART CATHETERIZATION WITH CORONARY ANGIOGRAM N/A 10/31/2014   Procedure: LEFT HEART CATHETERIZATION WITH CORONARY ANGIOGRAM;  Surgeon: Sherren Mocha, MD;  Location: [facility] CATH LAB;  Service: Cardiovascular;  Laterality: N/A;   PTCA     Hx of it.    SIGMOIDOSCOPY     TEE WITHOUT CARDIOVERSION N/A 11/03/2014   Procedure: TRANSESOPHAGEAL ECHOCARDIOGRAM (TEE);  Surgeon: Ivin Poot, MD;  Location: [place];  Service: Open Heart Surgery;  Laterality: N/A;   TOTAL KNEE ARTHROPLASTY  02/2011   Right knee   TOTAL KNEE ARTHROPLASTY Left 04/11/2016   Procedure: LEFT TOTAL KNEE ARTHROPLASTY;  Surgeon: Vickey Huger, MD;  Location: [place];  Service: Orthopedics;  Laterality: Left;    reports that he quit smoking about 39 years ago. His smoking use included cigarettes. He quit smokeless tobacco use about 39 years ago.  His smokeless tobacco use included chew. He reports that he does not drink alcohol and does not use drugs. family history includes COPD in his mother; Colon cancer (age of onset: 19) in his daughter; Heart attack in  his brother, father, and sister; Heart disease in his father and sister; Heart failure in his mother; Hypertension in his son and son. Allergies  Allergen Reactions   Protamine Rash    Hypotension, Rash, unstable vitals.     Statins Other (See Comments)    Caused soreness (Lipitor, Zocor)   Adhesive [Tape] Hives   Imodium [Loperamide] Itching and Rash   Rosuvastatin Calcium Other (See Comments)    Caused soreness (Lipitor, Zocor) 12/28/2020 Pt can tolerate low dose      Outpatient Encounter Medications as of 09/30/2022  Medication Sig   acetaminophen (TYLENOL) 500 MG tablet Take 500-1,000 mg by mouth 2 (two) times daily as needed (for pain).    albuterol (VENTOLIN HFA) 108 (90 Base) MCG/ACT inhaler Inhale 2 puffs into the lungs every 6 (six) hours as needed for wheezing or shortness of breath.   cholecalciferol (VITAMIN D3) 25 MCG (1000 UNIT) tablet Take 1,000 Units by mouth daily.   ELIQUIS 5 MG TABS tablet Take 1 tablet by mouth twice daily   ferrous sulfate 325 (65 FE) MG tablet Take 325 mg by mouth daily with breakfast.   furosemide (LASIX) 20 MG tablet Take 1 tablet by mouth once daily   glucose blood (ONETOUCH ULTRA) test strip USE 1 STRIP TO CHECK GLUCOSE ONCE DAILY   isosorbide mononitrate (IMDUR) 30 MG 24 hr tablet Take 1 tablet by mouth once daily   vitamin B-12 (CYANOCOBALAMIN) 1000 MCG tablet Take 1,000 mcg by mouth daily.   nitroGLYCERIN (NITROSTAT) 0.4 MG SL tablet DISSOLVE ONE TABLET UNDER THE TONGUE EVERY 5 MINUTES AS NEEDED FOR CHEST PAIN.  DO NOT EXCEED A TOTAL OF 3 DOSES IN 15 MINUTES (Patient not taking: Reported on 09/30/2022)   valsartan (DIOVAN) 40 MG tablet Take 0.5 tablets (20 mg total) by mouth daily. (Patient not taking: Reported on 09/30/2022)   No facility-administered encounter medications on file as of 09/30/2022.     REVIEW OF SYSTEMS  : All other systems reviewed and negative except where noted in the History of Present Illness.   PHYSICAL EXAM: BP  124/60 (BP Location: Left Arm, Patient Position: Sitting, Cuff Size: Normal)   Pulse 80   Ht 5' 5.5" (1.664 m)   Wt 182 lb 6 oz (82.7 kg)   BMI 29.89 kg/m  General: Well developed white male in no acute distress Head: Normocephalic and atraumatic Eyes:  Sclerae anicteric, conjunctiva pink. Ears: Normal auditory acuity Lungs: Clear throughout to auscultation; no W/R/R. Heart: Regular rate and rhythm; no M/R/G. Abdomen: Soft, non-distended.  BS present.  Non-tender. Rectal:  Will be done at the time of colonoscopy. Musculoskeletal: Symmetrical with no gross deformities  Skin: No lesions on visible extremities Extremities: No edema  Neurological: Alert oriented x 4, grossly non-focal Psychological:  Alert and cooperative. Normal mood and affect  ASSESSMENT AND PLAN: *Iron deficiency anemia: Hemoglobin down 8.2 and 8.5 g compared to 13.4  g in March 2023.  Denies dark or bloody stools.  Is now on ferrous sulfate once daily.  Last colonoscopy 2012.  Not sure if he has ever had an upper endoscopy.  Will plan for both EGD and colonoscopy with Dr. Loletha Carrow. *Chronic anticoagulation with Eliquis:  Will hold Eliquis for 2 days prior to endoscopic procedures - will instruct when and how to resume after procedure. Benefits and risks of procedure explained including risks of bleeding, perforation, infection, missed lesions, reactions to medications and possible need for hospitalization and surgery for complications. Additional rare but real risk of stroke or other vascular clotting events off of Eliquis also explained and need to seek urgent help if any signs of these problems occur. Will communicate by phone or EMR with patient's prescribing provider, Dr. Angelena Form, to confirm that holding Eliqius is reasonable in this case.     CC:  Jinny Sanders, MD

## 2022-10-03 NOTE — Telephone Encounter (Signed)
Patient with diagnosis of afib on Eliquis for anticoagulation.    Procedure: EGD/colonoscopy Date of procedure: 10/25/22  CHA2DS2-VASc Score = 5  This indicates a 7.2% annual risk of stroke. The patient's score is based upon: CHF History: 0 HTN History: 1 Diabetes History: 1 Stroke History: 0 Vascular Disease History: 1 Age Score: 2 Gender Score: 0  CrCl 36mL/min using adj body weight Platelet count 340K  Per office protocol, patient can hold Eliquis for 2 days prior to procedure as requested.    **This guidance is not considered finalized until pre-operative APP has relayed final recommendations.**

## 2022-10-03 NOTE — Telephone Encounter (Signed)
   Primary Cardiologist: Lauree Chandler, MD  Chart reviewed as part of pre-operative protocol coverage. Given past medical history and time since last visit, based on ACC/AHA guidelines, [name] would be at acceptable risk for the planned procedure without further cardiovascular testing.   Patient underwent went cardiac PET scan and echocardiogram for complaint of DOE. Test results were unremarkable. He was found to have significant anemia and is now scheduled for EGD and colonoscopy.  He is at moderate risk for low risk procedure and may proceed without further testing.  Patient was advised that if he develops new symptoms prior to surgery to contact our office to arrange a follow-up appointment. He verbalized understanding.  Per office protocol, patient can hold Eliquis for 2 days prior to procedure as requested.     I will route this recommendation to the requesting party via Epic fax function and remove from pre-op pool.  Please call with questions.  Emmaline Life, NP-C  10/03/2022, 11:49 AM [address], [address] Office [phone] Fax [phone]

## 2022-10-04 NOTE — Progress Notes (Signed)
____________________________________________________________  Attending physician addendum:  Thank you for sending this case to me. I have reviewed the entire note and agree with the plan.  Incidentally (and for documentation purposes), LVEF normal on echocardiogram this month. So he can have his EGD and colonoscopy in the [facility] as planned.  Wilfrid Lund, MD  ____________________________________________________________

## 2022-10-11 NOTE — Telephone Encounter (Signed)
Patient informed to hold Eliquis prior to procedure. Patient voiced understanding.

## 2022-10-14 ENCOUNTER — Other Ambulatory Visit: Payer: Self-pay | Admitting: Cardiovascular Disease

## 2022-10-14 ENCOUNTER — Encounter: Payer: Self-pay | Admitting: Gastroenterology

## 2022-10-18 ENCOUNTER — Telehealth: Payer: Self-pay | Admitting: Cardiovascular Disease

## 2022-10-18 NOTE — Telephone Encounter (Signed)
Patient stated he is currently having shortness of breath, fatigue and headaches. Pt stated he did not start having these symptoms until he started taking Eliquis. He did contact his PCP, referral for upper endoscopy and colonoscopy was ordered. Pt stated "this will not help his shortness of breath". Pt stated he will like to restart plavix. Advised patient to call 911 or go to the nearest emergency room. He replied " I am afraid to of the emergency room because I had a good friend who died in the waiting room. Attempt to explain why go to the nearest ED is the best option, his wife was also taking in the background stating " yea if he has to go the emergency room then I will have to wait outside". Once again explained ED precautions to the patient, he voiced understanding . Will forward to MD and nurse.

## 2022-10-18 NOTE — Telephone Encounter (Signed)
Pt c/o medication issue:  1. Name of Medication: ELIQUIS 5 MG TABS tablet   2. How are you currently taking this medication (dosage and times per day)?   3. Are you having a reaction (difficulty breathing--STAT)?   4. What is your medication issue? Patient is requesting call back to discuss this medication. Did not want to provide additional info.

## 2022-10-19 NOTE — Telephone Encounter (Signed)
Per message from Dr. Clifton Neo, called patient and added to schedule tomorrow morning .  Pt in agreement.

## 2022-10-20 ENCOUNTER — Encounter: Payer: Self-pay | Admitting: Cardiovascular Disease

## 2022-10-20 ENCOUNTER — Ambulatory Visit: Payer: Medicare HMO | Attending: Cardiovascular Disease | Admitting: Cardiovascular Disease

## 2022-10-20 VITALS — BP 130/68 | HR 82 | Ht 65.5 in | Wt 180.6 lb

## 2022-10-20 DIAGNOSIS — I10 Essential (primary) hypertension: Secondary | ICD-10-CM

## 2022-10-20 DIAGNOSIS — R5383 Other fatigue: Secondary | ICD-10-CM

## 2022-10-20 DIAGNOSIS — I4819 Other persistent atrial fibrillation: Secondary | ICD-10-CM

## 2022-10-20 DIAGNOSIS — D508 Other iron deficiency anemias: Secondary | ICD-10-CM

## 2022-10-20 DIAGNOSIS — R0609 Other forms of dyspnea: Secondary | ICD-10-CM

## 2022-10-20 DIAGNOSIS — I25118 Atherosclerotic heart disease of native coronary artery with other forms of angina pectoris: Secondary | ICD-10-CM | POA: Diagnosis not present

## 2022-10-20 DIAGNOSIS — I279 Pulmonary heart disease, unspecified: Secondary | ICD-10-CM | POA: Diagnosis not present

## 2022-10-20 NOTE — Patient Instructions (Addendum)
Medication Instructions:  No changes today. *If you need a refill on your cardiac medications before your next appointment, please call your pharmacy*   Lab Work: Today: CBC If you have labs (blood work) drawn today and your tests are completely normal, you will receive your results only by: MyChart Message (if you have MyChart) OR A paper copy in the mail If you have any lab test that is abnormal or we need to change your treatment, we will call you to review the results.   Testing/Procedures: None   Follow-Up: As planned  Other Instructions Please reschedule your appointment with Dr.  Cristi Loron, the GI doctor.

## 2022-10-20 NOTE — Addendum Note (Signed)
Addended by: Ferne Reus on: 10/20/2022 10:12 AM   Modules accepted: Orders

## 2022-10-20 NOTE — Progress Notes (Signed)
Chief Complaint  Patient presents with   Follow-up    CAD, dyspnea    History of Present Illness: 88 yo male with history of CAD s/p CABG 2016, prior coronary stent placement before his CABG, HTN, hyperlipidemia, paroxysmal atrial fibrillation and GERD who presents today for cardiac follow up. He has been followed for CAD since 2005 when he had a LAD stent placed. Cardiac catheterization April 2016 with multivessel disease with severe 95% distal left main stenosis.  He underwent 4V CABG (LIMA-LAD, SVG-DX, SVG-PDA, SVG-OM) April 2016. Echo normal in April 2017. He was seen in July 2019 for chest pain, palpitations and uncontrolled blood pressure. Nuclear stress test July 2019 with no ischemia. Echo July 2019 with LVEF=60-65% with grade 1 diastolic dysfunction. Mild MR. Cardiac monitor July 2019 with type 2 second degree AV block and junctional bradycardia with frequent PACs. HIs beta blocker was stopped. He felt better after stopping the beta blocker. He had trouble speaking in December 2020 and EMS strips showed sinus with PACs.  EKG in ED with sinus. Echo overall normal in February 2021. Cardiac monitor March 2021 with Sinus, Mobitz 1 block, no atrial fib. He was admitted to [facility] in June 2022 with Covid pneumonia. He had atrial fib while admitted. Plavix was stopped and Eliquis was started. Echo June 2022 with LVEF=70-75%, moderate LVH, normal RV function. Trivial MR. Trivial AI. He was seen in our office 08/22/22 with c/o dyspnea on exertion and mild chest pain. Norvasc and Lisinopril were stopped at his request and he was started on valsartan. PET CT Cardiac perfusion imaging 09/14/22 with no ischemia. Echo 09/20/22 with LVEF=65-70%. Moderate LVH. Severe left and right atrial enlargement. Trivial MR. Mild TR. He has had anemia over the past year with Hgb down from 13.4 to 8.5. He was seen by GI on 09/30/22 and recommendations were made for colonoscopy and EGD but he cancelled this. He was started on iron  supplementation. He stopped his Diovan two months ago due to low blood pressure. His BP has been in a normal range at home.   He is here today for follow up. He called our office this week reporting ongoing dyspnea and fatigue. No Hgb check in two months. He is known to be anemic as above. He cancelled his GI workup. He has no energy.His stools have not been dark despite being on iron. His wife thinks he looks pale. He denies any chest pain, palpitations, lower extremity edema, orthopnea, PND, dizziness, near syncope or syncope.    Primary Care Physician: Excell Seltzer, MD  Past Medical History:  Diagnosis Date   CAD (coronary artery disease)    s/p stent mid LAD 2005 // s/p CABG 2016 // Myoview 8/19:  EF 64, no ischemia   Coronary artery disease involving native coronary artery of native heart without angina pectoris    Depression    DJD (degenerative joint disease)    DM type 2 with diabetic peripheral neuropathy 03/10/2017   E coli bacteremia 04/20/2016   Essential hypertension 04/20/2016   GERD (gastroesophageal reflux disease)    History of echocardiogram    Echo 8/19: Moderate LVH, EF 60-65, normal wall motion, grade 1 diastolic dysfunction, mild MR, mild LAE, normal RVSF, mild TR, PASP 25   History of hiatal hernia    HTN (hypertension)    Hyperlipidemia    Neuropathy due to type 2 diabetes mellitus 03/10/2017   PERCUTANEOUS TRANSLUMINAL CORONARY ANGIOPLASTY, HX OF 11/26/2007   Annotation: With CYPHER  STENT. Qualifier: Diagnosis of  By: Genelle Gather CMA, [place]     Pre-diabetes    S/P CABG x 4 11/03/2014   S/P total knee replacement 04/11/2016   Sepsis due to Escherichia coli (E. coli) 04/20/2016   Tinea pedis 09/08/2017    Past Surgical History:  Procedure Laterality Date   BRAIN SURGERY     CARDIAC CATHETERIZATION     CORONARY ARTERY BYPASS GRAFT N/A 11/03/2014   Procedure: CORONARY ARTERY BYPASS GRAFTING (CABG), ON PUMP, TIMES FOUR, USING LEFT INTERNAL MAMMARY, RIGHT GREATER  SAPHENOUS VEIN HARVESTED ENDOSCOPICALLY;  Surgeon: Kerin Perna, MD;  Location: [facility] OR;  Service: Open Heart Surgery;  Laterality: N/A;  LIMA-LAD; SVG-DIAG; SVG-OM; SVG-RCA   CRANIOTOMY N/A 01/18/2014   Procedure: CRANIOTOMY HEMATOMA EVACUATION SUBDURAL;  Surgeon: Hewitt Shorts, MD;  Location: [facility] OR;  Service: Neurosurgery;  Laterality: N/A;   kidney stone removal     KNEE SURGERY     LEFT HEART CATHETERIZATION WITH CORONARY ANGIOGRAM N/A 07/27/2011   Procedure: LEFT HEART CATHETERIZATION WITH CORONARY ANGIOGRAM;  Surgeon: Kathleene Hazel, MD;  Location: [facility] CATH LAB;  Service: Cardiovascular;  Laterality: N/A;   LEFT HEART CATHETERIZATION WITH CORONARY ANGIOGRAM N/A 10/31/2014   Procedure: LEFT HEART CATHETERIZATION WITH CORONARY ANGIOGRAM;  Surgeon: Tonny Bollman, MD;  Location: [facility] CATH LAB;  Service: Cardiovascular;  Laterality: N/A;   PTCA     Hx of it.    SIGMOIDOSCOPY     TEE WITHOUT CARDIOVERSION N/A 11/03/2014   Procedure: TRANSESOPHAGEAL ECHOCARDIOGRAM (TEE);  Surgeon: Kerin Perna, MD;  Location: [facility] OR;  Service: Open Heart Surgery;  Laterality: N/A;   TOTAL KNEE ARTHROPLASTY  02/2011   Right knee   TOTAL KNEE ARTHROPLASTY Left 04/11/2016   Procedure: LEFT TOTAL KNEE ARTHROPLASTY;  Surgeon: Dannielle Huh, MD;  Location: [facility] OR;  Service: Orthopedics;  Laterality: Left;    Current Outpatient Medications  Medication Sig Dispense Refill   acetaminophen (TYLENOL) 500 MG tablet Take 500-1,000 mg by mouth 2 (two) times daily as needed (for pain).      albuterol (VENTOLIN HFA) 108 (90 Base) MCG/ACT inhaler Inhale 2 puffs into the lungs every 6 (six) hours as needed for wheezing or shortness of breath. 18 g 0   cholecalciferol (VITAMIN D3) 25 MCG (1000 UNIT) tablet Take 1,000 Units by mouth daily.     ELIQUIS 5 MG TABS tablet Take 1 tablet by mouth twice daily 180 tablet 0   ferrous sulfate 325 (65 FE) MG tablet Take 325 mg by mouth daily with breakfast.     furosemide (LASIX) 20  MG tablet Take 1 tablet by mouth once daily 30 tablet 3   glucose blood (ONETOUCH ULTRA) test strip USE 1 STRIP TO CHECK GLUCOSE ONCE DAILY 100 each 3   isosorbide mononitrate (IMDUR) 30 MG 24 hr tablet Take 1 tablet by mouth once daily 90 tablet 1   nitroGLYCERIN (NITROSTAT) 0.4 MG SL tablet DISSOLVE ONE TABLET UNDER THE TONGUE EVERY 5 MINUTES AS NEEDED FOR CHEST PAIN.  DO NOT EXCEED A TOTAL OF 3 DOSES IN 15 MINUTES 25 tablet 2   valsartan (DIOVAN) 40 MG tablet Take 0.5 tablets (20 mg total) by mouth daily. 45 tablet 3   vitamin B-12 (CYANOCOBALAMIN) 1000 MCG tablet Take 1,000 mcg by mouth daily.     No current facility-administered medications for this visit.    Allergies  Allergen Reactions   Protamine Rash    Hypotension, Rash, unstable vitals.     Statins Other (  See Comments)    Caused soreness (Lipitor, Zocor)   Adhesive [Tape] Hives   Imodium [Loperamide] Itching and Rash   Rosuvastatin Calcium Other (See Comments)    Caused soreness (Lipitor, Zocor) 12/28/2020 Pt can tolerate low dose    Social History   Socioeconomic History   Marital status: Married    Spouse name: Not on file   Number of children: 3   Years of education: Not on file   Highest education level: Not on file  Occupational History   Occupation: retired  Tobacco Use   Smoking status: Former    Types: Cigarettes    Quit date: 1985    Years since quitting: 39.3   Smokeless tobacco: Former    Types: Chew    Quit date: 1985   Tobacco comments:    quit in 1985. smoked 1ppd for 35 years   Vaping Use   Vaping Use: Never used  Substance and Sexual Activity   Alcohol use: No   Drug use: No   Sexual activity: Never  Other Topics Concern   Not on file  Social History Narrative   Married with children. 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 10:20 am    Has living will,  HCPOA: jerrie Wirsing, full code (reviewed 60)   Social Determinants of Health    Financial Resource Strain: Low Risk  (06/22/2021)   Overall Financial Resource Strain (CARDIA)    Difficulty of Paying Living Expenses: Not hard at all  Food Insecurity: No Food Insecurity (06/22/2021)   Hunger Vital Sign    Worried About Running Out of Food in the Last Year: Never true    Ran Out of Food in the Last Year: Never true  Transportation Needs: No Transportation Needs (06/22/2021)   PRAPARE - Administrator, Civil Service (Medical): No    Lack of Transportation (Non-Medical): No  Physical Activity: Inactive (06/22/2021)   Exercise Vital Sign    Days of Exercise per Week: 0 days    Minutes of Exercise per Session: 0 min  Stress: No Stress Concern Present (06/22/2021)   [employer] of Occupational Health - Occupational Stress Questionnaire    Feeling of Stress : Not at all  Social Connections: Socially Integrated (06/22/2021)   Social Connection and Isolation Panel [NHANES]    Frequency of Communication with Friends and Family: More than three times a week    Frequency of Social Gatherings with Friends and Family: Three times a week    Attends Religious Services: More than 4 times per year    Active Member of Clubs or Organizations: Yes    Attends Banker Meetings: More than 4 times per year    Marital Status: Married  Catering manager Violence: Not At Risk (06/22/2021)   Humiliation, Afraid, Rape, and Kick questionnaire    Fear of Current or Ex-Partner: No    Emotionally Abused: No    Physically Abused: No    Sexually Abused: No    Family History  Problem Relation Age of Onset   Heart failure Mother    COPD Mother    Heart disease Father    Heart attack Father    Heart disease Sister    Heart attack Sister    Heart attack Brother    Colon cancer Daughter 88       mets   Hypertension Son    Hypertension Son     Review of Systems:  As stated  in the HPI and otherwise negative.   BP 130/68   Pulse 82   Ht 5' 5.5" (1.664 m)    Wt 81.9 kg   SpO2 98%   BMI 29.60 kg/m   Physical Examination: General: Well developed, well nourished, NAD  HEENT: OP clear, mucus membranes moist  SKIN: warm, dry. No rashes. Neuro: No focal deficits  Musculoskeletal: Muscle strength 5/5 all ext  Psychiatric: Mood and affect normal  Neck: No JVD, no carotid bruits, no thyromegaly, no lymphadenopathy.  Lungs:Clear bilaterally, no wheezes, rhonci, crackles Cardiovascular: Irregular irregular. No murmurs, gallops or rubs. Abdomen:Soft. Bowel sounds present. Non-tender.  Extremities: No lower extremity edema. Pulses are 2 + in the bilateral DP/PT.  Echo March 2024: 1. Mild intracavitary gradient. Peak velocity 0.9 m/s. Peak gradient 3.2  mmHg. Left ventricular ejection fraction, by estimation, is 65 to 70%. The  left ventricle has normal function. The left ventricle has no regional  wall motion abnormalities. There  is moderate concentric left ventricular hypertrophy. Left ventricular  diastolic function could not be evaluated.   2. Right ventricular systolic function is normal. The right ventricular  size is mildly enlarged. There is mildly elevated pulmonary artery  systolic pressure.   3. Left atrial size was severely dilated.   4. Right atrial size was severely dilated.   5. The mitral valve is normal in structure. Trivial mitral valve  regurgitation. No evidence of mitral stenosis.   6. Tricuspid valve regurgitation is mild to moderate.   7. The aortic valve is tricuspid. Aortic valve regurgitation is not  visualized. No aortic stenosis is present.   8. Aortic dilatation noted. There is mild dilatation of the aortic root,  measuring 36 mm. There is mild dilatation of the ascending aorta,  measuring 39 mm.   9. The inferior vena cava is dilated in size with >50% respiratory  variability, suggesting right atrial pressure of 8 mmHg.    EKG:  EKG is ordered today. The ekg ordered today demonstrates Atrial fib, rate 82 bpm,  RBBB  Recent Labs: 08/22/2022: ALT 12; BUN 19; Creatinine, Ser 0.87; Potassium 4.7; Sodium 141; TSH 1.810 08/25/2022: Hemoglobin 8.5 Repeated and verified X2.; Platelets 340.0   Lipid Panel    Component Value Date/Time   CHOL 148 08/22/2022 0921   TRIG 55 08/22/2022 0921   HDL 42 08/22/2022 0921   CHOLHDL 3.5 08/22/2022 0921   CHOLHDL 4 11/16/2021 0906   VLDL 12.8 11/16/2021 0906   LDLCALC 94 08/22/2022 0921     Wt Readings from Last 3 Encounters:  10/20/22 81.9 kg  09/30/22 82.7 kg  08/25/22 81.3 kg     Assessment and Plan:   1. CAD without angina: He is s/p 4V CABG in 2016. PET CT Cardiac perfusion test in March 2024 with no ischemia. LV function normal by echo in March 2024. He has not tolerated beta blockers due to fatigue and AV block. I do not think his current symptoms are related to his CAD. Will continue Imdur. He does not wish to take a statin.   2. HTN: BP is within a normal range on no medical therapy. He stopped his Diovan two months ago due to soft BP at home.   3. Hyperlipidemia: LDL 94 in February 2024. He has been intolerant of statins and stopped Crestor earlier this year. Consider referral to lipid clinic after we have completed his workup for anemia. He is resistant to adding new medical therapy.       4.  Bradycardia/Second degree AV block: No dizziness. Will keep off of AV nodal blocking agents.   5. Snoring/Daytime Somnolence: He does not wish to arrange a sleep study.  6. Atrial fibrillation, paroxysmal: Rate controlled atrial fib today. He is not on AV nodal blocking agents. Continue Eliquis. He thinks Eliquis is causing all of his symptoms but I have explained his stroke risk off of Eliquis and have explained that Eliquis may make him more anemic if he has chronic blood loss. He will decide if he wishes to accept the increased risk of stroke if he stops Eliquis.   7. Chronic diastolic CHF: Weight is stable. No volume overload on exam. LV function normal by  recent echo. Continue Lasix.   8. Anemia:  I am concerned that his fatigue and dyspnea is due to his anemia. His Hgb was 13.4 one year ago. His last Hgb was 8.5 two months ago. He was seen by primary care and GI but cancelled his EGD/colonoscopy. I will check a CBC today. If he remains anemic, he will need to complete his GI workup. Continue iron supplementation.   Labs/ tests ordered today include:  No orders of the defined types were placed in this encounter.   Disposition:   F/U with me or office APP in 4-6 weeks.   Signed, Verne Carrow, MD 10/20/2022 8:08 AM    [facility] [address] [place], [place], [place]  [address] Phone: [phone]; Fax: [phone]

## 2022-10-21 ENCOUNTER — Telehealth: Payer: Self-pay | Admitting: Cardiovascular Disease

## 2022-10-21 ENCOUNTER — Other Ambulatory Visit: Payer: Self-pay

## 2022-10-21 ENCOUNTER — Encounter (HOSPITAL_COMMUNITY): Payer: Self-pay

## 2022-10-21 ENCOUNTER — Emergency Department (HOSPITAL_COMMUNITY)
Admission: EM | Admit: 2022-10-21 | Discharge: 2022-10-21 | Disposition: A | Discharge status: Home / Self Care | Payer: Medicare HMO | Attending: Emergency Medicine | Admitting: Emergency Medicine

## 2022-10-21 DIAGNOSIS — R7989 Other specified abnormal findings of blood chemistry: Secondary | ICD-10-CM | POA: Diagnosis not present

## 2022-10-21 DIAGNOSIS — Z951 Presence of aortocoronary bypass graft: Secondary | ICD-10-CM | POA: Diagnosis not present

## 2022-10-21 DIAGNOSIS — Z7901 Long term (current) use of anticoagulants: Secondary | ICD-10-CM | POA: Insufficient documentation

## 2022-10-21 DIAGNOSIS — I10 Essential (primary) hypertension: Secondary | ICD-10-CM | POA: Insufficient documentation

## 2022-10-21 DIAGNOSIS — I2510 Atherosclerotic heart disease of native coronary artery without angina pectoris: Secondary | ICD-10-CM | POA: Insufficient documentation

## 2022-10-21 DIAGNOSIS — R42 Dizziness and giddiness: Secondary | ICD-10-CM | POA: Diagnosis not present

## 2022-10-21 DIAGNOSIS — D509 Iron deficiency anemia, unspecified: Secondary | ICD-10-CM | POA: Insufficient documentation

## 2022-10-21 LAB — IRON AND TIBC
Iron: 5 ug/dL — ABNORMAL LOW (ref 45–182)
Saturation Ratios: 1 % — ABNORMAL LOW (ref 17.9–39.5)
TIBC: 477 ug/dL — ABNORMAL HIGH (ref 250–450)
UIBC: 472 ug/dL

## 2022-10-21 LAB — BASIC METABOLIC PANEL
Anion gap: 8 (ref 5–15)
BUN: 20 mg/dL (ref 8–23)
CO2: 22 mmol/L (ref 22–32)
Calcium: 9 mg/dL (ref 8.9–10.3)
Chloride: 108 mmol/L (ref 98–111)
Creatinine, Ser: 1 mg/dL (ref 0.61–1.24)
GFR, Estimated: >60 mL/min (ref >60)
Glucose, Bld: 124 mg/dL — ABNORMAL HIGH (ref 70–99)
Potassium: 4 mmol/L (ref 3.5–5.1)
Sodium: 138 mmol/L (ref 135–145)

## 2022-10-21 LAB — CBC WITH DIFFERENTIAL/PLATELET
Abs Immature Granulocytes: 0.01 10*3/uL (ref 0.00–0.07)
Basophils Absolute: 0 10*3/uL (ref 0.0–0.1)
Basophils Relative: 1 %
Eosinophils Absolute: 0.2 10*3/uL (ref 0.0–0.5)
Eosinophils Relative: 3 %
HCT: 23.2 % — ABNORMAL LOW (ref 39.0–52.0)
Hemoglobin: 6.2 g/dL — CL (ref 13.0–17.0)
Immature Granulocytes: 0 %
Lymphocytes Relative: 21 %
Lymphs Abs: 1.3 10*3/uL (ref 0.7–4.0)
MCH: 22.9 pg — ABNORMAL LOW (ref 26.0–34.0)
MCHC: 26.7 g/dL — ABNORMAL LOW (ref 30.0–36.0)
MCV: 85.6 fL (ref 80.0–100.0)
Monocytes Absolute: 0.6 10*3/uL (ref 0.1–1.0)
Monocytes Relative: 11 %
Neutro Abs: 3.8 10*3/uL (ref 1.7–7.7)
Neutrophils Relative %: 64 %
Platelets: 290 10*3/uL (ref 150–400)
RBC: 2.71 MIL/uL — ABNORMAL LOW (ref 4.22–5.81)
RDW: 21.5 % — ABNORMAL HIGH (ref 11.5–15.5)
WBC: 5.9 10*3/uL (ref 4.0–10.5)
nRBC: 0 % (ref 0.0–0.2)

## 2022-10-21 LAB — CBC
Hematocrit: 20.9 % — ABNORMAL LOW (ref 37.5–51.0)
Hemoglobin: 6 g/dL — CL (ref 13.0–17.7)
MCH: 23.4 pg — ABNORMAL LOW (ref 26.6–33.0)
MCHC: 28.7 g/dL — ABNORMAL LOW (ref 31.5–35.7)
MCV: 82 fL (ref 79–97)
Platelets: 314 10*3/uL (ref 150–450)
RBC: 2.56 x10E6/uL — CL (ref 4.14–5.80)
RDW: 18.9 % — ABNORMAL HIGH (ref 11.6–15.4)
WBC: 6 10*3/uL (ref 3.4–10.8)

## 2022-10-21 LAB — POC OCCULT BLOOD, ED: Fecal Occult Bld: NEGATIVE

## 2022-10-21 LAB — BPAM RBC
Blood Product Expiration Date: 202405092359
ISSUE DATE / TIME: 202404121703
ISSUE DATE / TIME: 202404121943
Unit Type and Rh: 5100

## 2022-10-21 LAB — RETICULOCYTES
Immature Retic Fract: 25.5 % — ABNORMAL HIGH (ref 2.3–15.9)
RBC.: 2.53 MIL/uL — ABNORMAL LOW (ref 4.22–5.81)
Retic Count, Absolute: 88.6 10*3/uL (ref 19.0–186.0)
Retic Ct Pct: 3.5 % — ABNORMAL HIGH (ref 0.4–3.1)

## 2022-10-21 LAB — TYPE AND SCREEN

## 2022-10-21 LAB — VITAMIN B12: Vitamin B-12: 637 pg/mL (ref 180–914)

## 2022-10-21 LAB — PREPARE RBC (CROSSMATCH)

## 2022-10-21 LAB — FERRITIN: Ferritin: 34 ng/mL (ref 24–336)

## 2022-10-21 LAB — FOLATE: Folate: 18.3 ng/mL (ref >5.9)

## 2022-10-21 NOTE — Telephone Encounter (Signed)
[facility] called to report a critical lab finding on this pt to Dr. Clifton Lazlo.  Per [facility], pts hemoglobin is 6.0.  Will route this message to Dr. Clifton Ramadan and RN for further review and management.

## 2022-10-21 NOTE — Telephone Encounter (Signed)
Called and spoke w patient and his wife.  He has not taken Eliquis today.  Adv of lab results and recommendation to go to the ER.   Explained this is the reason for his symtpoms and to proceed there urgently now.  They are in agreement.

## 2022-10-21 NOTE — ED Provider Notes (Signed)
EMERGENCY DEPARTMENT AT [facility] Provider Note   CSN: [id number] Arrival date & time: 10/21/22  1200     History  Chief Complaint  Patient presents with   Abnormal Labs    [name] is a 88 y.o. male.  Patient with history of CAD s/p CABG 2016, prior coronary stent placement before his CABG, HTN, hyperlipidemia, paroxysmal atrial fibrillation and GERD.  Patient has been followed by GI recently for declining hemoglobins.  In February he was in the 8 range.  He has been feeling more fatigued and has been unable to do activities he enjoys, such as gardening, due to shortness of breath.  He saw his cardiologist yesterday who check lab work and his hemoglobin is now down to 6.0, 6.2 on recheck in the emergency department.  This is a normocytic anemia.  Patient denies seeing any bloody or black stools.  He currently is on an iron supplementation.  GI had wanted to schedule upper endoscopy and colonoscopy, however patient canceled this with plans to reschedule.  Denies active chest pain or shortness of breath at rest.       Home Medications Prior to Admission medications   Medication Sig Start Date End Date Taking? Authorizing Provider  acetaminophen (TYLENOL) 500 MG tablet Take 500-1,000 mg by mouth 2 (two) times daily as needed (for pain).     [provider]  albuterol (VENTOLIN HFA) 108 (90 Base) MCG/ACT inhaler Inhale 2 puffs into the lungs every 6 (six) hours as needed for wheezing or shortness of breath. 08/25/22   Bedsole, Amy E, MD  cholecalciferol (VITAMIN D3) 25 MCG (1000 UNIT) tablet Take 1,000 Units by mouth daily.    [provider]  ELIQUIS 5 MG TABS tablet Take 1 tablet by mouth twice daily Patient not taking: Reported on 10/21/2022 09/11/22   Excell Seltzer, MD  ferrous sulfate 325 (65 FE) MG tablet Take 325 mg by mouth daily with breakfast.    [provider]  furosemide (LASIX) 20 MG tablet Take 1 tablet by mouth once  daily 09/19/22   Bedsole, Amy E, MD  glucose blood (ONETOUCH ULTRA) test strip USE 1 STRIP TO CHECK GLUCOSE ONCE DAILY 08/30/22   Ermalene Searing, Amy E, MD  isosorbide mononitrate (IMDUR) 30 MG 24 hr tablet Take 1 tablet by mouth once daily 09/26/22   Kathleene Hazel, MD  nitroGLYCERIN (NITROSTAT) 0.4 MG SL tablet DISSOLVE ONE TABLET UNDER THE TONGUE EVERY 5 MINUTES AS NEEDED FOR CHEST PAIN.  DO NOT EXCEED A TOTAL OF 3 DOSES IN 15 MINUTES 07/13/22   Kathleene Hazel, MD  valsartan (DIOVAN) 40 MG tablet Take 0.5 tablets (20 mg total) by mouth daily. 08/22/22   Swinyer, Zachary George, NP  vitamin B-12 (CYANOCOBALAMIN) 1000 MCG tablet Take 1,000 mcg by mouth daily.    [provider]      Allergies    Protamine, Statins, Adhesive [tape], Imodium [loperamide], and Rosuvastatin calcium    Review of Systems   Review of Systems  Physical Exam Updated Vital Signs BP (!) 151/72 (BP Location: Right Arm)   Pulse 90   Temp 97.6 F (36.4 C)   Resp 16   SpO2 100%   Physical Exam Vitals and nursing note reviewed.  Constitutional:      Appearance: He is well-developed. He is not diaphoretic.  HENT:     Head: Normocephalic and atraumatic.     Mouth/Throat:     Mouth: Mucous membranes are not  dry.  Eyes:     Comments: Pale conjunctiva  Neck:     Vascular: Normal carotid pulses. No carotid bruit or JVD.     Trachea: Trachea normal. No tracheal deviation.  Cardiovascular:     Rate and Rhythm: Normal rate and regular rhythm.     Pulses: No decreased pulses.          Radial pulses are 2+ on the right side and 2+ on the left side.     Heart sounds: Normal heart sounds, S1 normal and S2 normal. Heart sounds not distant. No murmur heard. Pulmonary:     Effort: Pulmonary effort is normal. No respiratory distress.     Breath sounds: Normal breath sounds. No wheezing.  Chest:     Chest wall: No tenderness.  Abdominal:     General: Bowel sounds are normal.     Palpations: Abdomen is soft.      Tenderness: There is no abdominal tenderness. There is no guarding or rebound.  Musculoskeletal:     Cervical back: Normal range of motion and neck supple. No muscular tenderness.     Right lower leg: No edema.     Left lower leg: No edema.  Skin:    General: Skin is warm and dry.     Coloration: Skin is pale.  Neurological:     Mental Status: He is alert. Mental status is at baseline.  Psychiatric:        Mood and Affect: Mood normal.     ED Results / Procedures / Treatments   Labs (all labs ordered are listed, but only abnormal results are displayed) Labs Reviewed  CBC WITH DIFFERENTIAL/PLATELET - Abnormal; Notable for the following components:      Result Value   RBC 2.71 (*)    Hemoglobin 6.2 (*)    HCT 23.2 (*)    MCH 22.9 (*)    MCHC 26.7 (*)    RDW 21.5 (*)    All other components within normal limits  BASIC METABOLIC PANEL - Abnormal; Notable for the following components:   Glucose, Bld 124 (*)    All other components within normal limits  IRON AND TIBC - Abnormal; Notable for the following components:   Iron 5 (*)    TIBC 477 (*)    Saturation Ratios 1 (*)    All other components within normal limits  RETICULOCYTES - Abnormal; Notable for the following components:   Retic Ct Pct 3.5 (*)    RBC. 2.53 (*)    Immature Retic Fract 25.5 (*)    All other components within normal limits  VITAMIN B12  FOLATE  FERRITIN  POC OCCULT BLOOD, ED  TYPE AND SCREEN  PREPARE RBC (CROSSMATCH)    EKG None  Radiology No results found.  Procedures Procedures    Medications Ordered in ED Medications - No data to display  ED Course/ Medical Decision Making/ A&P    Patient seen and examined. History obtained directly from patient.  I also reviewed GI notes from 3/22 and cardiology notes from 4/11.  Blood transfusion has been ordered.  I discussed this with the patient.  He will need Hemoccult exam to check for bleeding.  Labs/EKG: Personally reviewed and  interpreted Labs ordered in triage including CBC demonstrating hemoglobin of 6.2 with a HemaCart of 23.2, MCV normal, white blood cell count normal; BMP with glucose 124 otherwise unremarkable with normal kidney function; type and screen.  Imaging: None ordered  Medications/Fluids: Blood transfusion  Most  recent vital signs reviewed and are as follows: BP (!) 151/72 (BP Location: Right Arm)   Pulse 90   Temp 97.6 F (36.4 C)   Resp 16   SpO2 100%   Initial impression: Anemia.   3:45 PM Hemoccult performed. Scant amount of light brown stool on DRE. Hemoccult neg. Added anemia panel labs prior to blood.   5:24 PM Reassessment performed. Patient appears stable. Updated on negative hemoccult.   I had a discussion with the patient and his wife at bedside in regards to disposition.  I recommended that he stay in the hospital for further evaluation of his anemia and to ensure that he receives enough blood.  Both the patient and wife have reservations about being admitted and declined.  Patient's wife states that she does not like when he is in the hospital because he is not cared for by "doctors that know him".  She also states that it takes a lot of time to get in with the primary care doctor, which I stated is the reason that admission would be recommended because it may expedite workup.  I also discussed alternative of giving the patient a couple units of blood here, discharging him with close follow-up next week.  This is what he would like to do.  Patient starting first unit of blood now.  Anemia panel has been ordered and is being sent.  Patient discussed with Dr. Rodena Medin.   Plan: At this time, blood transfusion with plans for dispo.   10:29 PM Reassessment performed. Patient appears stable.  He tolerated transfusion well.  Transfusion completed.  Labs personally reviewed and interpreted including: Normal B12, normal folate, iron very low at 5 with elevated TIBC, ferritin normal, elevated  reticulocyte percentage however absolute reticulocyte count is normal.  Reviewed pertinent lab work and imaging with patient at bedside. Questions answered.   Most current vital signs reviewed and are as follows: BP (!) 151/71   Pulse 73   Temp 97.9 F (36.6 C) (Oral)   Resp 20   SpO2 98%   Plan: Discharge to home.   Other home care instructions discussed: Continue iron supplementation  ED return instructions discussed: Return with chest pain, shortness of breath, new or worsening symptoms  Follow-up instructions discussed: Patient encouraged to follow-up with their PCP in 3 days.  Stressed the importance of this as he will need to have his blood counts rechecked and his iron levels addressed.  CRITICAL CARE Performed by: Renne Crigler PA-C Total critical care time: 40 minutes Critical care time was exclusive of separately billable procedures and treating other patients. Critical care was necessary to treat or prevent imminent or life-threatening deterioration. Critical care was time spent personally by me on the following activities: development of treatment plan with patient and/or surrogate as well as nursing, discussions with consultants, evaluation of patient's response to treatment, examination of patient, obtaining history from patient or surrogate, ordering and performing treatments and interventions, ordering and review of laboratory studies, ordering and review of radiographic studies, pulse oximetry and re-evaluation of patient's condition.                            Medical Decision Making Amount and/or Complexity of Data Reviewed Labs: ordered.   Patient who is on anticoagulation presented today with worsening anemia.  He has dropped to around 6.  He has had some lightheadedness but no syncope.  No cardiovascular instability.  He agrees to blood  transfusion.  Given cardiac history, target hemoglobin greater than 8 so patient was given 2 units.  Advised admission for  further workup however patient and wife do not want him to be admitted.  Patient is in sound mind and can make medical decisions on his behalf.  I did perform anemia panel prior to transfusion which did show low iron which is possibly the etiology of his anemia.  No active GI bleeding at time of DRE.  Heme-negative.  No abdominal pain.        Final Clinical Impression(s) / ED Diagnoses Final diagnoses:  Iron deficiency anemia, unspecified iron deficiency anemia type    Rx / DC Orders ED Discharge Orders     None         Renne Crigler, Cordelia Poche 10/21/22 2233    Wynetta Fines, MD 10/21/22 [phone]

## 2022-10-21 NOTE — Telephone Encounter (Signed)
Caller reporting out of range results. 

## 2022-10-21 NOTE — ED Notes (Addendum)
Sandwich provided.

## 2022-10-21 NOTE — ED Provider Triage Note (Signed)
Emergency Medicine Provider Triage Evaluation Note  [name] , a 88 y.o. male  was evaluated in triage.  Patient presenting from cardiologist due to anemia.  Cardiology previously told him his blood count was 8 however now he had blood drawn yesterday and his hemoglobin was 6.  He is on Eliquis but did not take it yesterday.  No melena or hematochezia.  Does endorse some dyspnea on exertion and fatigue.  No baseline shortness of breath or palpitations  He has never needed a blood transfusion in the past however will accept products  Review of Systems  Positive:  Negative:   Physical Exam  BP (!) 151/72 (BP Location: Right Arm)   Pulse 90   Temp 97.6 F (36.4 C)   Resp 16   SpO2 100%  Gen:   Awake, no distress   Resp:  Normal effort  MSK:   Moves extremities without difficulty  Other:  Pale  Medical Decision Making  Medically screening exam initiated at 12:12 PM.  Appropriate orders placed.  [name] was informed that the remainder of the evaluation will be completed by another provider, this initial triage assessment does not replace that evaluation, and the importance of remaining in the ED until their evaluation is complete.     Craig Benders, PA-C 10/21/22 1213

## 2022-10-21 NOTE — Discharge Instructions (Signed)
As we discussed night, your blood cell counts were very low.  You were given 2 units of blood.  Your iron level was also found to be very low.  Please continue to take your iron supplementation.  It is very important that you see your doctor on Monday to have your blood cell counts rechecked, as you did not want to come into the hospital to have these monitored.  Please return if you have lightheadedness or passout, worsening shortness of breath, chest pain, or other concerns.

## 2022-10-21 NOTE — ED Triage Notes (Signed)
Pt was sent here by his cardiologist for his hemoglobin being low in the labs they checked. A/Ox4, endorses SOB, some lightheaded, denies dizzy.

## 2022-10-22 LAB — TYPE AND SCREEN
ABO/RH(D): O POS
Antibody Screen: NEGATIVE
Unit division: 0
Unit division: 0

## 2022-10-22 LAB — BPAM RBC
Blood Product Expiration Date: 202405092359
Unit Type and Rh: 5100

## 2022-10-25 ENCOUNTER — Encounter: Payer: Medicare HMO | Admitting: Gastroenterology

## 2022-10-27 ENCOUNTER — Telehealth: Payer: Self-pay | Admitting: *Deleted

## 2022-10-27 NOTE — Telephone Encounter (Signed)
     Patient  visit on 10/21/2022  at [facility] was for treatment   Have you been able to follow up with your primary care physician? Doing better will follow up PCP dr tomorrow no medications and transportations  The patient was able to obtain any needed medicine or equipment.  Are there diet recommendations that you are having difficulty following?  Patient expresses understanding of discharge instructions and education provided has no other needs at this time.    Yehuda Mao Greenauer -[facility] [place], Population Health [phone] [address] [place] , [place] [address] Email : [email or web address]

## 2022-10-28 ENCOUNTER — Ambulatory Visit (INDEPENDENT_AMBULATORY_CARE_PROVIDER_SITE_OTHER): Payer: Medicare HMO | Admitting: Family Medicine

## 2022-10-28 ENCOUNTER — Encounter: Payer: Self-pay | Admitting: Family Medicine

## 2022-10-28 VITALS — BP 146/62 | HR 97 | Temp 98.2°F | Ht 65.5 in | Wt 181.2 lb

## 2022-10-28 DIAGNOSIS — M791 Myalgia, unspecified site: Secondary | ICD-10-CM | POA: Diagnosis not present

## 2022-10-28 DIAGNOSIS — T466X5A Adverse effect of antihyperlipidemic and antiarteriosclerotic drugs, initial encounter: Secondary | ICD-10-CM | POA: Diagnosis not present

## 2022-10-28 DIAGNOSIS — D509 Iron deficiency anemia, unspecified: Secondary | ICD-10-CM

## 2022-10-28 NOTE — Progress Notes (Signed)
Patient ID: [name], male    DOB: Jul 06, 1935, 88 y.o.   MRN: [id number]  This visit was conducted in person.  BP (!) 146/62   Pulse 97   Temp 98.2 F (36.8 C) (Temporal)   Ht 5' 5.5" (1.664 m)   Wt 181 lb 4 oz (82.2 kg)   SpO2 96%   BMI 29.70 kg/m    CC:  Chief Complaint  Patient presents with   Anemia    Follow Up [facility] ED Visit    Subjective:   HPI: [name] is a 88 y.o. male with history of coronary artery disease status post CABG 2016, prior coronary stent placement, hypertension, hyperlipidemia, paroxysmal atrial fibrillation and reflux , resenting on 10/28/2022 for Anemia (Follow Up [facility] ED Visit)  Reviewed recent emergency room visit note from October 21, 2022  Recently he has been noting increased fatigue and inability to do things he enjoys. At cardiology visit on April 11 hemoglobin was checked down at 6, normocytic. He is currently on iron supplementation and did not see black or bloody stools. No chest pain or shortness of breath.    Patient has been followed by GI for declining hemoglobins. Had planned upper endoscopy and colonoscopy but this had been canceled by the patient.  Now scheduled.. Dr. Myrtie Neither 4/30  In the emergency room Hemoccult was negative.  Total iron very low at 5 with elevated TIBC, normal ferritin, normal folate. Given 2 units of packed red blood cells. Recommended hospital admission but patient refused and instead wanted outpatient follow-up.   Pt reports he has more energy. He did have some blood in stool after he left hospital.  Relevant past medical, surgical, family and social history reviewed and updated as indicated. Interim medical history since our last visit reviewed. Allergies and medications reviewed and updated. Outpatient Medications Prior to Visit  Medication Sig Dispense Refill   acetaminophen (TYLENOL) 500 MG tablet Take 500-1,000 mg by mouth 2 (two) times daily as needed (for pain).      albuterol (VENTOLIN HFA) 108  (90 Base) MCG/ACT inhaler Inhale 2 puffs into the lungs every 6 (six) hours as needed for wheezing or shortness of breath. 18 g 0   cholecalciferol (VITAMIN D3) 25 MCG (1000 UNIT) tablet Take 1,000 Units by mouth daily.     ferrous sulfate 325 (65 FE) MG tablet Take 325 mg by mouth daily with breakfast.     furosemide (LASIX) 20 MG tablet Take 1 tablet by mouth once daily 30 tablet 3   glucose blood (ONETOUCH ULTRA) test strip USE 1 STRIP TO CHECK GLUCOSE ONCE DAILY 100 each 3   isosorbide mononitrate (IMDUR) 30 MG 24 hr tablet Take 1 tablet by mouth once daily 90 tablet 1   nitroGLYCERIN (NITROSTAT) 0.4 MG SL tablet DISSOLVE ONE TABLET UNDER THE TONGUE EVERY 5 MINUTES AS NEEDED FOR CHEST PAIN.  DO NOT EXCEED A TOTAL OF 3 DOSES IN 15 MINUTES 25 tablet 2   vitamin B-12 (CYANOCOBALAMIN) 1000 MCG tablet Take 1,000 mcg by mouth daily.     ELIQUIS 5 MG TABS tablet Take 1 tablet by mouth twice daily (Patient not taking: Reported on 10/21/2022) 180 tablet 0   valsartan (DIOVAN) 40 MG tablet Take 0.5 tablets (20 mg total) by mouth daily. (Patient not taking: Reported on 10/28/2022) 45 tablet 3   No facility-administered medications prior to visit.     Per HPI unless specifically indicated in ROS section below Review of Systems  Constitutional:  Negative for fatigue and fever.  HENT:  Negative for ear pain.   Eyes:  Negative for pain.  Respiratory:  Negative for cough and shortness of breath.   Cardiovascular:  Negative for chest pain, palpitations and leg swelling.  Gastrointestinal:  Negative for abdominal pain.  Genitourinary:  Negative for dysuria.  Musculoskeletal:  Negative for arthralgias.  Neurological:  Negative for syncope, light-headedness and headaches.  Psychiatric/Behavioral:  Negative for dysphoric mood.    Objective:  BP (!) 146/62   Pulse 97   Temp 98.2 F (36.8 C) (Temporal)   Ht 5' 5.5" (1.664 m)   Wt 181 lb 4 oz (82.2 kg)   SpO2 96%   BMI 29.70 kg/m   Wt Readings from  Last 3 Encounters:  10/28/22 181 lb 4 oz (82.2 kg)  10/20/22 180 lb 9.6 oz (81.9 kg)  09/30/22 182 lb 6 oz (82.7 kg)      Physical Exam Constitutional:      Appearance: He is well-developed.  HENT:     Head: Normocephalic.     Right Ear: Hearing normal.     Left Ear: Hearing normal.     Nose: Nose normal.  Neck:     Thyroid: No thyroid mass or thyromegaly.     Vascular: No carotid bruit.     Trachea: Trachea normal.  Cardiovascular:     Rate and Rhythm: Normal rate. Rhythm irregularly irregular.     Pulses: Normal pulses.     Heart sounds: Heart sounds not distant. No murmur heard.    No friction rub. No gallop.     Comments: Slight  pitting peripheral edema.. sock indentation Pulmonary:     Effort: Pulmonary effort is normal. No respiratory distress.     Breath sounds: Normal breath sounds.  Musculoskeletal:     Right lower leg: No edema.     Left lower leg: No edema.  Skin:    General: Skin is warm and dry.     Findings: No rash.  Psychiatric:        Speech: Speech normal.        Behavior: Behavior normal.        Thought Content: Thought content normal.       Results for orders placed or performed during the hospital encounter of 10/21/22  CBC with Differential  Result Value Ref Range   WBC 5.9 4.0 - 10.5 K/uL   RBC 2.71 (L) 4.22 - 5.81 MIL/uL   Hemoglobin 6.2 (LL) 13.0 - 17.0 g/dL   HCT 16.1 (L) 09.6 - 04.5 %   MCV 85.6 80.0 - 100.0 fL   MCH 22.9 (L) 26.0 - 34.0 pg   MCHC 26.7 (L) 30.0 - 36.0 g/dL   RDW 40.9 (H) 81.1 - 91.4 %   Platelets 290 150 - 400 K/uL   nRBC 0.0 0.0 - 0.2 %   Neutrophils Relative % 64 %   Neutro Abs 3.8 1.7 - 7.7 K/uL   Lymphocytes Relative 21 %   Lymphs Abs 1.3 0.7 - 4.0 K/uL   Monocytes Relative 11 %   Monocytes Absolute 0.6 0.1 - 1.0 K/uL   Eosinophils Relative 3 %   Eosinophils Absolute 0.2 0.0 - 0.5 K/uL   Basophils Relative 1 %   Basophils Absolute 0.0 0.0 - 0.1 K/uL   WBC Morphology MORPHOLOGY UNREMARKABLE    RBC  Morphology See Note    Smear Review MORPHOLOGY UNREMARKABLE    Immature Granulocytes 0 %   Abs Immature  Granulocytes 0.01 0.00 - 0.07 K/uL   Tear Drop Cells PRESENT    Polychromasia PRESENT    Ovalocytes PRESENT   Basic metabolic panel  Result Value Ref Range   Sodium 138 135 - 145 mmol/L   Potassium 4.0 3.5 - 5.1 mmol/L   Chloride 108 98 - 111 mmol/L   CO2 22 22 - 32 mmol/L   Glucose, Bld 124 (H) 70 - 99 mg/dL   BUN 20 8 - 23 mg/dL   Creatinine, Ser 1.61 0.61 - 1.24 mg/dL   Calcium 9.0 8.9 - 09.6 mg/dL   GFR, Estimated >04 >54 mL/min   Anion gap 8 5 - 15  Vitamin B12  Result Value Ref Range   Vitamin B-12 637 180 - 914 pg/mL  Folate  Result Value Ref Range   Folate 18.3 >5.9 ng/mL  Iron and TIBC  Result Value Ref Range   Iron 5 (L) 45 - 182 ug/dL   TIBC 098 (H) 119 - 147 ug/dL   Saturation Ratios 1 (L) 17.9 - 39.5 %   UIBC 472 ug/dL  Ferritin  Result Value Ref Range   Ferritin 34 24 - 336 ng/mL  Reticulocytes  Result Value Ref Range   Retic Ct Pct 3.5 (H) 0.4 - 3.1 %   RBC. 2.53 (L) 4.22 - 5.81 MIL/uL   Retic Count, Absolute 88.6 19.0 - 186.0 K/uL   Immature Retic Fract 25.5 (H) 2.3 - 15.9 %  POC occult blood, ED  Result Value Ref Range   Fecal Occult Bld NEGATIVE NEGATIVE  Type and screen [facility]  Result Value Ref Range   ABO/RH(D) O POS    Antibody Screen NEG    Sample Expiration 10/24/2022,2359    Unit Number [id number]    Blood Component Type RED CELLS,LR    Unit division 00    Status of Unit ISSUED,FINAL    Transfusion Status OK TO TRANSFUSE    Crossmatch Result Compatible    Unit Number [id number]    Blood Component Type RED CELLS,LR    Unit division 00    Status of Unit ISSUED,FINAL    Transfusion Status OK TO TRANSFUSE    Crossmatch Result      Compatible Performed at [facility], [address], [place], [place] [address]   Prepare RBC (crossmatch)  Result Value Ref Range   Order Confirmation      ORDER  PROCESSED BY BLOOD BANK Performed at [facility], [address], [place], [place] [address]   [facility]  Result Value Ref Range   ISSUE DATE / TIME 366440347425    Blood Product Unit Number [id number]    PRODUCT CODE [id number]    Unit Type and Rh 5100    Blood Product Expiration Date 063016010932    ISSUE DATE / TIME 355732202542    Blood Product Unit Number [id number]    PRODUCT CODE [id number]    Unit Type and Rh 5100    Blood Product Expiration Date [id number]     Assessment and Plan  Iron deficiency anemia, unspecified iron deficiency anemia type Assessment & Plan: Acute worsening, continued downward trend. Now s/p 2 units PRBC  No longer on Eliquis for A-fib given unclear cause of anemia, like;ly chronic  GI blood loss.  Has scheduled endoscopy and colonoscopy with GI  4/30 as planned to look into GI source.... did have noted some blood in stool after discharged from ED.Marland Kitchen 1 episode, none further.  Will also  recommend referral to hematology for possible iron infusion, urgently  Continue iron.  Re-eval with cbc today.  Orders: -     Ambulatory referral to Hematology / Oncology -     CBC, No Differential/Platelet    No follow-ups on file.   Kerby Nora, MD

## 2022-10-28 NOTE — Assessment & Plan Note (Addendum)
Acute worsening, continued downward trend. Now s/p 2 units PRBC  No longer on Eliquis for A-fib given unclear cause of anemia, like;ly chronic  GI blood loss.  Has scheduled endoscopy and colonoscopy with GI  4/30 as planned to look into GI source.... did have noted some blood in stool after discharged from ED.Marland Kitchen 1 episode, none further.  Will also recommend referral to hematology for possible iron infusion, urgently  Continue iron.  Re-eval with cbc today.

## 2022-10-28 NOTE — Patient Instructions (Signed)
Please stop at the lab to have labs drawn.  We will move forward with referral to hematology for iron infusion.  Keep appt with GI.

## 2022-10-28 NOTE — Assessment & Plan Note (Signed)
Intolerant of statin in the past.  Not currently on statin medication to treat cholesterol despite diabetes.

## 2022-10-29 LAB — CBC, NO DIFFERENTIAL/PLATELET
Hematocrit: 30 % — ABNORMAL LOW (ref 37.5–51.0)
Hemoglobin: 8.9 g/dL — ABNORMAL LOW (ref 13.0–17.7)
MCH: 24.7 pg — ABNORMAL LOW (ref 26.6–33.0)
MCHC: 29.7 g/dL — ABNORMAL LOW (ref 31.5–35.7)
MCV: 83 fL (ref 79–97)
RBC: 3.6 x10E6/uL — ABNORMAL LOW (ref 4.14–5.80)
RDW: 18.7 % — ABNORMAL HIGH (ref 11.6–15.4)
WBC: 6.4 10*3/uL (ref 3.4–10.8)

## 2022-10-31 ENCOUNTER — Other Ambulatory Visit: Payer: Self-pay | Admitting: Cardiovascular Disease

## 2022-11-02 ENCOUNTER — Inpatient Hospital Stay: Payer: Medicare HMO | Attending: Physician Assistant

## 2022-11-02 ENCOUNTER — Inpatient Hospital Stay: Payer: Medicare HMO | Admitting: Physician Assistant

## 2022-11-02 ENCOUNTER — Other Ambulatory Visit: Payer: Self-pay

## 2022-11-02 VITALS — BP 129/67 | HR 65 | Temp 97.9°F | Resp 18 | Wt 177.4 lb

## 2022-11-02 DIAGNOSIS — Z87891 Personal history of nicotine dependence: Secondary | ICD-10-CM | POA: Diagnosis not present

## 2022-11-02 DIAGNOSIS — Z800 Family history of malignant neoplasm of digestive organs: Secondary | ICD-10-CM

## 2022-11-02 DIAGNOSIS — D509 Iron deficiency anemia, unspecified: Secondary | ICD-10-CM

## 2022-11-02 DIAGNOSIS — I10 Essential (primary) hypertension: Secondary | ICD-10-CM | POA: Diagnosis not present

## 2022-11-02 LAB — CMP (CANCER CENTER ONLY)
ALT: 14 U/L (ref 0–44)
AST: 20 U/L (ref 15–41)
Albumin: 3.9 g/dL (ref 3.5–5.0)
Alkaline Phosphatase: 49 U/L (ref 38–126)
Anion gap: 9 (ref 5–15)
BUN: 24 mg/dL — ABNORMAL HIGH (ref 8–23)
CO2: 23 mmol/L (ref 22–32)
Calcium: 9 mg/dL (ref 8.9–10.3)
Chloride: 107 mmol/L (ref 98–111)
Creatinine: 0.96 mg/dL (ref 0.61–1.24)
GFR, Estimated: >60 mL/min (ref >60)
Glucose, Bld: 118 mg/dL — ABNORMAL HIGH (ref 70–99)
Potassium: 4.2 mmol/L (ref 3.5–5.1)
Sodium: 139 mmol/L (ref 135–145)
Total Bilirubin: 0.5 mg/dL (ref 0.3–1.2)
Total Protein: 6.3 g/dL — ABNORMAL LOW (ref 6.5–8.1)

## 2022-11-02 LAB — CBC WITH DIFFERENTIAL (CANCER CENTER ONLY)
Abs Immature Granulocytes: 0.02 10*3/uL (ref 0.00–0.07)
Basophils Absolute: 0 10*3/uL (ref 0.0–0.1)
Basophils Relative: 1 %
Eosinophils Absolute: 0.3 10*3/uL (ref 0.0–0.5)
Eosinophils Relative: 4 %
HCT: 32 % — ABNORMAL LOW (ref 39.0–52.0)
Hemoglobin: 9.4 g/dL — ABNORMAL LOW (ref 13.0–17.0)
Immature Granulocytes: 0 %
Lymphocytes Relative: 30 %
Lymphs Abs: 2 10*3/uL (ref 0.7–4.0)
MCH: 25.1 pg — ABNORMAL LOW (ref 26.0–34.0)
MCHC: 29.4 g/dL — ABNORMAL LOW (ref 30.0–36.0)
MCV: 85.3 fL (ref 80.0–100.0)
Monocytes Absolute: 0.9 10*3/uL (ref 0.1–1.0)
Monocytes Relative: 14 %
Neutro Abs: 3.3 10*3/uL (ref 1.7–7.7)
Neutrophils Relative %: 51 %
Platelet Count: 244 10*3/uL (ref 150–400)
RBC: 3.75 MIL/uL — ABNORMAL LOW (ref 4.22–5.81)
RDW: 21.2 % — ABNORMAL HIGH (ref 11.5–15.5)
WBC Count: 6.5 10*3/uL (ref 4.0–10.5)
nRBC: 0 % (ref 0.0–0.2)

## 2022-11-02 LAB — IRON AND IRON BINDING CAPACITY (CC-WL,HP ONLY)
Iron: 23 ug/dL — ABNORMAL LOW (ref 45–182)
Saturation Ratios: 5 % — ABNORMAL LOW (ref 17.9–39.5)
TIBC: 479 ug/dL — ABNORMAL HIGH (ref 250–450)
UIBC: 456 ug/dL

## 2022-11-02 LAB — SAMPLE TO BLOOD BANK

## 2022-11-03 ENCOUNTER — Telehealth: Payer: Self-pay | Admitting: Physician Assistant

## 2022-11-03 ENCOUNTER — Encounter: Payer: Medicare HMO | Admitting: Physician Assistant

## 2022-11-03 ENCOUNTER — Encounter: Payer: Self-pay | Admitting: Physician Assistant

## 2022-11-03 ENCOUNTER — Telehealth: Payer: Self-pay

## 2022-11-03 ENCOUNTER — Other Ambulatory Visit: Payer: Medicare HMO

## 2022-11-03 LAB — FERRITIN: Ferritin: 44 ng/mL (ref 24–336)

## 2022-11-03 NOTE — Telephone Encounter (Signed)
Reached out to patient to schedule per 4/24 LOS, left voicemail.

## 2022-11-03 NOTE — Telephone Encounter (Signed)
LM for pt with lab results and appt date and time for his IV iron

## 2022-11-03 NOTE — Telephone Encounter (Signed)
-----   Message from Briant Cedar, PA-C sent at 11/03/2022 10:22 AM EDT ----- Notify patient that iron levels are low and he will need IV iron. We will arrange at [facility]

## 2022-11-03 NOTE — Telephone Encounter (Signed)
Patient called to verify time of appointment tomorrow.

## 2022-11-03 NOTE — Progress Notes (Signed)
[facility] [phone]   [phone]  INITIAL CONSULT NOTE  Patient Care Team: Excell Seltzer, MD as PCP - General (Family Medicine) Kathleene Hazel, MD as PCP - Cardiology (Cardiology) Jethro Bolus, MD as Consulting Physician (Ophthalmology) Phil Dopp, [facility] as Pharmacist (Pharmacist)   CHIEF COMPLAINTS/PURPOSE OF CONSULTATION:  Iron deficiency anemia  HISTORY OF PRESENTING ILLNESS:  [name] 88 y.o. male with medical history significant for CAD, T2DM, HTN and hyperlipidemia presents to the hematology clinic for evaluation of iron deficiency anemia. She is accompanied by his wife for this visit.   On review of the previous records, there is evidence of chronic anemia for several years as far back as 2012. More recently, patient presented to ED on 10/21/2022 with acute anemia with Hgb of 6.0, MCV 82. Iron panel showed deficiency with serum iron 5, TIBC 477, saturation 1%, ferritin 34. He was given 2 units of PRBC during ED visit.   On exam today, [name] reports improvement of energy after receiving blood transfusions. He is still tired which impacts his ADLs. He denies any appetite changes or dietary restrictions. He denies nausea, vomiting or abdominal pain. He has noticed some constipation with the iron pills. Additionally, his stools are dark after starting the iron pills. He denies any overt signs of bleeding. This includes hematochezia, melena, hematuria, or hemoptysis. He reports shortness of breath with exertion which improved with the blood transfusion. He denies fevers, chills, sweats, chest pain or cough. He has no other complaints. Rest of the 10 point ROS is below.   MEDICAL HISTORY:  Past Medical History:  Diagnosis Date   CAD (coronary artery disease)    s/p stent mid LAD 2005 // s/p CABG 2016 // Myoview 8/19:  EF 64, no ischemia   Coronary artery disease involving native coronary artery of native heart without angina  pectoris    Depression    DJD (degenerative joint disease)    DM type 2 with diabetic peripheral neuropathy 03/10/2017   E coli bacteremia 04/20/2016   Essential hypertension 04/20/2016   GERD (gastroesophageal reflux disease)    History of echocardiogram    Echo 8/19: Moderate LVH, EF 60-65, normal wall motion, grade 1 diastolic dysfunction, mild MR, mild LAE, normal RVSF, mild TR, PASP 25   History of hiatal hernia    HTN (hypertension)    Hyperlipidemia    Neuropathy due to type 2 diabetes mellitus 03/10/2017   PERCUTANEOUS TRANSLUMINAL CORONARY ANGIOPLASTY, HX OF 11/26/2007   Annotation: With CYPHER STENT. Qualifier: Diagnosis of  By: Genelle Gather CMA, [place]     Pre-diabetes    S/P CABG x 4 11/03/2014   S/P total knee replacement 04/11/2016   Sepsis due to Escherichia coli (E. coli) 04/20/2016   Tinea pedis 09/08/2017    SURGICAL HISTORY: Past Surgical History:  Procedure Laterality Date   BRAIN SURGERY     CARDIAC CATHETERIZATION     CORONARY ARTERY BYPASS GRAFT N/A 11/03/2014   Procedure: CORONARY ARTERY BYPASS GRAFTING (CABG), ON PUMP, TIMES FOUR, USING LEFT INTERNAL MAMMARY, RIGHT GREATER SAPHENOUS VEIN HARVESTED ENDOSCOPICALLY;  Surgeon: Kerin Perna, MD;  Location: [facility] OR;  Service: Open Heart Surgery;  Laterality: N/A;  LIMA-LAD; SVG-DIAG; SVG-OM; SVG-RCA   CRANIOTOMY N/A 01/18/2014   Procedure: CRANIOTOMY HEMATOMA EVACUATION SUBDURAL;  Surgeon: Hewitt Shorts, MD;  Location: [facility] OR;  Service: Neurosurgery;  Laterality: N/A;   kidney stone removal     KNEE SURGERY     LEFT  HEART CATHETERIZATION WITH CORONARY ANGIOGRAM N/A 07/27/2011   Procedure: LEFT HEART CATHETERIZATION WITH CORONARY ANGIOGRAM;  Surgeon: Kathleene Hazel, MD;  Location: [facility] CATH LAB;  Service: Cardiovascular;  Laterality: N/A;   LEFT HEART CATHETERIZATION WITH CORONARY ANGIOGRAM N/A 10/31/2014   Procedure: LEFT HEART CATHETERIZATION WITH CORONARY ANGIOGRAM;  Surgeon: Tonny Bollman, MD;  Location: [facility] CATH  LAB;  Service: Cardiovascular;  Laterality: N/A;   PTCA     Hx of it.    SIGMOIDOSCOPY     TEE WITHOUT CARDIOVERSION N/A 11/03/2014   Procedure: TRANSESOPHAGEAL ECHOCARDIOGRAM (TEE);  Surgeon: Kerin Perna, MD;  Location: [facility] OR;  Service: Open Heart Surgery;  Laterality: N/A;   TOTAL KNEE ARTHROPLASTY  02/2011   Right knee   TOTAL KNEE ARTHROPLASTY Left 04/11/2016   Procedure: LEFT TOTAL KNEE ARTHROPLASTY;  Surgeon: Dannielle Huh, MD;  Location: [facility] OR;  Service: Orthopedics;  Laterality: Left;    SOCIAL HISTORY: Social History   Socioeconomic History   Marital status: Married    Spouse name: Not on file   Number of children: 3   Years of education: Not on file   Highest education level: Not on file  Occupational History   Occupation: retired  Tobacco Use   Smoking status: Former    Types: Cigarettes    Quit date: 1985    Years since quitting: 39.3   Smokeless tobacco: Former    Types: Chew    Quit date: 1985   Tobacco comments:    quit in 1985. smoked 1ppd for 35 years   Vaping Use   Vaping Use: Never used  Substance and Sexual Activity   Alcohol use: No   Drug use: No   Sexual activity: Never  Other Topics Concern   Not on file  Social History Narrative   Married with children. 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 10:20 am    Has living will,  HCPOA: jerrie Fujiwara, full code (reviewed 17)   Social Determinants of Health   Financial Resource Strain: Low Risk  (06/22/2021)   Overall Financial Resource Strain (CARDIA)    Difficulty of Paying Living Expenses: Not hard at all  Food Insecurity: No Food Insecurity (11/02/2022)   Hunger Vital Sign    Worried About Running Out of Food in the Last Year: Never true    Ran Out of Food in the Last Year: Never true  Transportation Needs: No Transportation Needs (11/02/2022)   PRAPARE - Administrator, Civil Service (Medical): No    Lack of Transportation (Non-Medical):  No  Physical Activity: Inactive (06/22/2021)   Exercise Vital Sign    Days of Exercise per Week: 0 days    Minutes of Exercise per Session: 0 min  Stress: No Stress Concern Present (06/22/2021)   [employer] of Occupational Health - Occupational Stress Questionnaire    Feeling of Stress : Not at all  Social Connections: Socially Integrated (06/22/2021)   Social Connection and Isolation Panel [NHANES]    Frequency of Communication with Friends and Family: More than three times a week    Frequency of Social Gatherings with Friends and Family: Three times a week    Attends Religious Services: More than 4 times per year    Active Member of Clubs or Organizations: Yes    Attends Banker Meetings: More than 4 times per year    Marital Status: Married  Intimate Partner Violence: Not At Risk (11/02/2022)  Humiliation, Afraid, Rape, and Kick questionnaire    Fear of Current or Ex-Partner: No    Emotionally Abused: No    Physically Abused: No    Sexually Abused: No    FAMILY HISTORY: Family History  Problem Relation Age of Onset   Heart failure Mother    COPD Mother    Heart disease Father    Heart attack Father    Heart disease Sister    Heart attack Sister    Heart attack Brother    Colon cancer Daughter 92       mets   Hypertension Son    Hypertension Son     ALLERGIES:  is allergic to protamine, statins, adhesive [tape], imodium [loperamide], and rosuvastatin calcium.  MEDICATIONS:  Current Outpatient Medications  Medication Sig Dispense Refill   valsartan (DIOVAN) 40 MG tablet Take 0.5 tablets (20 mg total) by mouth daily. (Patient taking differently: Take 20 mg by mouth daily. Only takes as needed) 45 tablet 3   acetaminophen (TYLENOL) 500 MG tablet Take 500-1,000 mg by mouth 2 (two) times daily as needed (for pain).      albuterol (VENTOLIN HFA) 108 (90 Base) MCG/ACT inhaler Inhale 2 puffs into the lungs every 6 (six) hours as needed for wheezing or  shortness of breath. 18 g 0   cholecalciferol (VITAMIN D3) 25 MCG (1000 UNIT) tablet Take 1,000 Units by mouth daily.     ELIQUIS 5 MG TABS tablet Take 1 tablet by mouth twice daily (Patient not taking: Reported on 10/21/2022) 180 tablet 0   ferrous sulfate 325 (65 FE) MG tablet Take 325 mg by mouth daily with breakfast.     furosemide (LASIX) 20 MG tablet Take 1 tablet by mouth once daily 30 tablet 3   glucose blood (ONETOUCH ULTRA) test strip USE 1 STRIP TO CHECK GLUCOSE ONCE DAILY 100 each 3   isosorbide mononitrate (IMDUR) 30 MG 24 hr tablet Take 1 tablet by mouth once daily 90 tablet 1   nitroGLYCERIN (NITROSTAT) 0.4 MG SL tablet DISSOLVE ONE TABLET UNDER THE TONGUE EVERY 5 MINUTES AS NEEDED FOR CHEST PAIN.  DO NOT EXCEED A TOTAL OF 3 DOSES IN 15 MINUTES 25 tablet 2   vitamin B-12 (CYANOCOBALAMIN) 1000 MCG tablet Take 1,000 mcg by mouth daily.     No current facility-administered medications for this visit.    REVIEW OF SYSTEMS:   Constitutional: ( - ) fevers, ( - )  chills , ( - ) night sweats Eyes: ( - ) blurriness of vision, ( - ) double vision, ( - ) watery eyes Ears, nose, mouth, throat, and face: ( - ) mucositis, ( - ) sore throat Respiratory: ( - ) cough, ( + ) dyspnea, ( - ) wheezes Cardiovascular: ( - ) palpitation, ( - ) chest discomfort, ( - ) lower extremity swelling Gastrointestinal:  ( - ) nausea, ( - ) heartburn, ( - ) change in bowel habits Skin: ( - ) abnormal skin rashes Lymphatics: ( - ) new lymphadenopathy, ( - ) easy bruising Neurological: ( - ) numbness, ( - ) tingling, ( - ) new weaknesses Behavioral/Psych: ( - ) mood change, ( - ) new changes  All other systems were reviewed with the patient and are negative.  PHYSICAL EXAMINATION: ECOG PERFORMANCE STATUS: 1 - Symptomatic but completely ambulatory  Vitals:   11/02/22 1505  BP: 129/67  Pulse: 65  Resp: 18  Temp: 97.9 F (36.6 C)  SpO2: 97%   Filed Weights  11/02/22 1505  Weight: 177 lb 6.4 oz (80.5  kg)    GENERAL: elderly appearing male in NAD  SKIN: skin color, texture, turgor are normal, no rashes or significant lesions EYES: conjunctiva are pink and non-injected, sclera clear OROPHARYNX: no exudate, no erythema; lips, buccal mucosa, and tongue normal  LYMPH:  no palpable lymphadenopathy in the cervical or supraclavicular lymph nodes.  LUNGS: clear to auscultation and percussion with normal breathing effort HEART: regular rate & rhythm and no murmurs and no lower extremity edema Musculoskeletal: no cyanosis of digits and no clubbing  PSYCH: alert & oriented x 3, fluent speech NEURO: no focal motor/sensory deficits  LABORATORY DATA:  I have reviewed the data as listed    Latest Ref Rng & Units 11/02/2022    3:56 PM 10/28/2022   11:33 AM 10/21/2022   12:13 PM  CBC  WBC 4.0 - 10.5 K/uL 6.5  6.4  5.9   Hemoglobin 13.0 - 17.0 g/dL 9.4  8.9  6.2   Hematocrit 39.0 - 52.0 % 32.0  30.0  23.2   Platelets 150 - 400 K/uL 244   290        Latest Ref Rng & Units 11/02/2022    3:56 PM 10/21/2022   12:13 PM 08/22/2022    9:21 AM  CMP  Glucose 70 - 99 mg/dL 161  096  045   BUN 8 - 23 mg/dL 24  20  19    Creatinine 0.61 - 1.24 mg/dL 4.09  8.11  9.14   Sodium 135 - 145 mmol/L 139  138  141   Potassium 3.5 - 5.1 mmol/L 4.2  4.0  4.7   Chloride 98 - 111 mmol/L 107  108  107   CO2 22 - 32 mmol/L 23  22  22    Calcium 8.9 - 10.3 mg/dL 9.0  9.0  9.2   Total Protein 6.5 - 8.1 g/dL 6.3   6.4   Total Bilirubin 0.3 - 1.2 mg/dL 0.5   0.3   Alkaline Phos 38 - 126 U/L 49   68   AST 15 - 41 U/L 20   13   ALT 0 - 44 U/L 14   12     ASSESSMENT & PLAN [name] is a 88 y.o. male who presents to the hematology clinic for evaluation for iron deficiency anemia. He is currently on PO iron replacement and is scheduled for EGD/colonoscopy on 11/08/2022. I discussed role for IV iron to bolster iron and hemoglobin levels since PO iron is not adequate enough. Recommend repeat labs today to check levels and  schedule IV iron infusion in the next few days. Patient is in agreement of the plan and would like to move forward with the workup.   #iron deficiency anemia-etiology unknown: --scheduled for EGD/colonoscopy on 11/07/2021 with Dr. Myrtie Neither --labs today to check CBC, CMP, ferritin, retic panel, iron and TIBC levels --currently on PO iron daily, mild constipation. Okay to continue once daily.  --encouraged to eat iron rich foods.  --consider blood transfusion if Hgb <8 --recommend IV iron to bolster iron and hgb levels. --RTC in 8 weeks with repeat labs.   Orders Placed This Encounter  Procedures   CBC with Differential (Cancer Center Only)    Standing Status:   Future    Number of Occurrences:   1    Standing Expiration Date:   11/02/2023   CMP (Cancer Center only)    Standing Status:   Future  Number of Occurrences:   1    Standing Expiration Date:   11/02/2023   Iron and Iron Binding Capacity ([facility] only)    Standing Status:   Future    Number of Occurrences:   1    Standing Expiration Date:   11/02/2023   Ferritin    Standing Status:   Future    Number of Occurrences:   1    Standing Expiration Date:   11/02/2023   Sample to Blood Bank    Standing Status:   Future    Number of Occurrences:   1    Standing Expiration Date:   11/02/2023    All questions were answered. The patient knows to call the clinic with any problems, questions or concerns.  I have spent a total of 60 minutes minutes of face-to-face and non-face-to-face time, preparing to see the patient, obtaining and/or reviewing separately obtained history, performing a medically appropriate examination, counseling and educating the patient, ordering medications/tests/procedures, documenting clinical information in the electronic health record,  and care coordination.   Georga Kaufmann, PA-C Department of Hematology/Oncology [facility] Phone: [phone]  Patient was seen with Dr.  Leonides Schanz  I have read the above note and personally examined the patient. I agree with the assessment and plan as noted above.  Briefly [name] is an 88 year old male who presents for evaluation of iron deficiency anemia.  At this time there is concern for GI etiology.  He is going to be undergoing colonoscopy on 11/08/2022 with [facility].  Most recent labs show white blood cell count 6.4, hemoglobin 8.9, and platelets of 290, though he required 2 units of packed red blood cells on 10/21/2022 when his hemoglobin dropped to 6.2.  At this time I would recommend we proceed with IV iron therapy and that he continue his p.o. ferrous sulfate.  Patient voiced understanding of our plan moving forward.  Encouraged him to keep his appointment with GI.   Craig Barns, MD Department of Hematology/Oncology [facility] Phone: [phone] Pager: [phone] Email: [email or web address]

## 2022-11-04 ENCOUNTER — Other Ambulatory Visit: Payer: Self-pay

## 2022-11-04 ENCOUNTER — Inpatient Hospital Stay: Payer: Medicare HMO

## 2022-11-04 VITALS — BP 129/65 | HR 76 | Temp 98.1°F | Resp 16

## 2022-11-04 DIAGNOSIS — I10 Essential (primary) hypertension: Secondary | ICD-10-CM | POA: Diagnosis not present

## 2022-11-04 DIAGNOSIS — D509 Iron deficiency anemia, unspecified: Secondary | ICD-10-CM

## 2022-11-04 DIAGNOSIS — Z87891 Personal history of nicotine dependence: Secondary | ICD-10-CM | POA: Diagnosis not present

## 2022-11-04 DIAGNOSIS — Z800 Family history of malignant neoplasm of digestive organs: Secondary | ICD-10-CM | POA: Diagnosis not present

## 2022-11-04 MED ORDER — SODIUM CHLORIDE 0.9 % IV SOLN: Freq: Once | INTRAVENOUS | Status: AC

## 2022-11-04 MED ORDER — SODIUM CHLORIDE 0.9 % IV SOLN
200.0000 mg | Freq: Once | INTRAVENOUS | Status: AC
  Administered 2022-11-04: 200 mg via INTRAVENOUS
  Filled 2022-11-04: qty 200

## 2022-11-04 NOTE — Patient Instructions (Signed)

## 2022-11-05 ENCOUNTER — Encounter: Payer: Self-pay | Admitting: Physician Assistant

## 2022-11-06 ENCOUNTER — Other Ambulatory Visit: Payer: Self-pay | Admitting: Cardiovascular Disease

## 2022-11-08 ENCOUNTER — Encounter: Payer: Self-pay | Admitting: Gastroenterology

## 2022-11-08 ENCOUNTER — Ambulatory Visit (AMBULATORY_SURGERY_CENTER): Payer: Medicare HMO | Admitting: Gastroenterology

## 2022-11-08 VITALS — BP 166/83 | HR 62 | Temp 97.8°F | Resp 21 | Ht 65.5 in | Wt 182.0 lb

## 2022-11-08 DIAGNOSIS — D509 Iron deficiency anemia, unspecified: Secondary | ICD-10-CM

## 2022-11-08 DIAGNOSIS — D128 Benign neoplasm of rectum: Secondary | ICD-10-CM | POA: Diagnosis not present

## 2022-11-08 DIAGNOSIS — Z7901 Long term (current) use of anticoagulants: Secondary | ICD-10-CM | POA: Diagnosis not present

## 2022-11-08 DIAGNOSIS — D122 Benign neoplasm of ascending colon: Secondary | ICD-10-CM | POA: Diagnosis not present

## 2022-11-08 DIAGNOSIS — K2970 Gastritis, unspecified, without bleeding: Secondary | ICD-10-CM | POA: Diagnosis not present

## 2022-11-08 DIAGNOSIS — K2971 Gastritis, unspecified, with bleeding: Secondary | ICD-10-CM | POA: Diagnosis not present

## 2022-11-08 DIAGNOSIS — I10 Essential (primary) hypertension: Secondary | ICD-10-CM | POA: Diagnosis not present

## 2022-11-08 DIAGNOSIS — I2510 Atherosclerotic heart disease of native coronary artery without angina pectoris: Secondary | ICD-10-CM | POA: Diagnosis not present

## 2022-11-08 DIAGNOSIS — E119 Type 2 diabetes mellitus without complications: Secondary | ICD-10-CM | POA: Diagnosis not present

## 2022-11-08 DIAGNOSIS — K2950 Unspecified chronic gastritis without bleeding: Secondary | ICD-10-CM | POA: Diagnosis not present

## 2022-11-08 MED ORDER — SODIUM CHLORIDE 0.9 % IV SOLN
500.0000 mL | Freq: Once | INTRAVENOUS | Status: DC
Start: 2022-11-08 — End: 2022-11-08

## 2022-11-08 NOTE — Patient Instructions (Addendum)
- Resume previous diet. - Continue present medications. - Await pathology results. - Resume Eliquis (apixaban) at prior dose tomorrow. - See the other procedure note for documentation of additional recommendations. -CBC and iron studies in 3 to 4 weeks with primary care. If iron levels and hemoglobin cannot be maintained in a near normal range with oral iron, then intravenous iron should be used.   YOU HAD AN ENDOSCOPIC PROCEDURE TODAY AT THE [facility]:   Refer to the procedure report that was given to you for any specific questions about what was found during the examination.  If the procedure report does not answer your questions, please call your gastroenterologist to clarify.  If you requested that your care partner not be given the details of your procedure findings, then the procedure report has been included in a sealed envelope for you to review at your convenience later.  YOU SHOULD EXPECT: Some feelings of bloating in the abdomen. Passage of more gas than usual.  Walking can help get rid of the air that was put into your GI tract during the procedure and reduce the bloating. If you had a lower endoscopy (such as a colonoscopy or flexible sigmoidoscopy) you may notice spotting of blood in your stool or on the toilet paper. If you underwent a bowel prep for your procedure, you may not have a normal bowel movement for a few days.  Please Note:  You might notice some irritation and congestion in your nose or some drainage.  This is from the oxygen used during your procedure.  There is no need for concern and it should clear up in a day or so.  SYMPTOMS TO REPORT IMMEDIATELY:  Following lower endoscopy (colonoscopy or flexible sigmoidoscopy):  Excessive amounts of blood in the stool  Significant tenderness or worsening of abdominal pains  Swelling of the abdomen that is new, acute  Fever of 100F or higher  Following upper endoscopy (EGD)  Vomiting of blood or coffee ground  material  New chest pain or pain under the shoulder blades  Painful or persistently difficult swallowing  New shortness of breath  Fever of 100F or higher  Black, tarry-looking stools  For urgent or emergent issues, a gastroenterologist can be reached at any hour by calling [phone]. Do not use MyChart messaging for urgent concerns.    DIET:  We do recommend a small meal at first, but then you may proceed to your regular diet.  Drink plenty of fluids but you should avoid alcoholic beverages for 24 hours.  ACTIVITY:  You should plan to take it easy for the rest of today and you should NOT DRIVE or use heavy machinery until tomorrow (because of the sedation medicines used during the test).    FOLLOW UP: Our staff will call the number listed on your records the next business day following your procedure.  We will call around 7:15- 8:00 am to check on you and address any questions or concerns that you may have regarding the information given to you following your procedure. If we do not reach you, we will leave a message.     If any biopsies were taken you will be contacted by phone or by letter within the next 1-3 weeks.  Please call us at [phone] if you have not heard about the biopsies in 3 weeks.    SIGNATURES/CONFIDENTIALITY: You and/or your care partner have signed paperwork which will be entered into your electronic medical record.  These signatures  attest to the fact that that the information above on your After Visit Summary has been reviewed and is understood.  Full responsibility of the confidentiality of this discharge information lies with you and/or your care-partner.

## 2022-11-08 NOTE — Progress Notes (Signed)
VS completed by DT.   I have reviewed the patient's medical history in detail and updated the computerized patient record.  

## 2022-11-08 NOTE — Progress Notes (Unsigned)
History and Physical:  This patient presents for endoscopic testing for: Encounter Diagnoses  Name Primary?   Chronic anticoagulation Yes   Iron deficiency anemia, unspecified iron deficiency anemia type     Clinical details in office consult note 09/30/22.  IDA. Afterward, progressive anemia detected at cardiology office visit - ED 10/21/22 - provider note and labs reviewed.  Iron deficient, nml B12 and folate.  Heme neg on DRE.  Rec'd PRBC transfusion. Hgb 9.4 on 11/02/22  Patient is otherwise without complaints or active issues today.   Past Medical History: Past Medical History:  Diagnosis Date   CAD (coronary artery disease)    s/p stent mid LAD 2005 // s/p CABG 2016 // Myoview 8/19:  EF 64, no ischemia   Coronary artery disease involving native coronary artery of native heart without angina pectoris    Depression    DJD (degenerative joint disease)    DM type 2 with diabetic peripheral neuropathy (HCC) 03/10/2017   E coli bacteremia 04/20/2016   Essential hypertension 04/20/2016   GERD (gastroesophageal reflux disease)    History of echocardiogram    Echo 8/19: Moderate LVH, EF 60-65, normal wall motion, grade 1 diastolic dysfunction, mild MR, mild LAE, normal RVSF, mild TR, PASP 25   History of hiatal hernia    HTN (hypertension)    Hyperlipidemia    Neuropathy due to type 2 diabetes mellitus (HCC) 03/10/2017   PERCUTANEOUS TRANSLUMINAL CORONARY ANGIOPLASTY, HX OF 11/26/2007   Annotation: With CYPHER STENT. Qualifier: Diagnosis of  By: Genelle Gather CMA, [place]     Pre-diabetes    S/P CABG x 4 11/03/2014   S/P total knee replacement 04/11/2016   Sepsis due to Escherichia coli (E. coli) (HCC) 04/20/2016   Tinea pedis 09/08/2017     Past Surgical History: Past Surgical History:  Procedure Laterality Date   BRAIN SURGERY     CARDIAC CATHETERIZATION     COLONOSCOPY     CORONARY ARTERY BYPASS GRAFT N/A 11/03/2014   Procedure: CORONARY ARTERY BYPASS GRAFTING (CABG), ON PUMP, TIMES  FOUR, USING LEFT INTERNAL MAMMARY, RIGHT GREATER SAPHENOUS VEIN HARVESTED ENDOSCOPICALLY;  Surgeon: Kerin Perna, MD;  Location: [facility] OR;  Service: Open Heart Surgery;  Laterality: N/A;  LIMA-LAD; SVG-DIAG; SVG-OM; SVG-RCA   CRANIOTOMY N/A 01/18/2014   Procedure: CRANIOTOMY HEMATOMA EVACUATION SUBDURAL;  Surgeon: Hewitt Shorts, MD;  Location: [facility] OR;  Service: Neurosurgery;  Laterality: N/A;   kidney stone removal     KNEE SURGERY     LEFT HEART CATHETERIZATION WITH CORONARY ANGIOGRAM N/A 07/27/2011   Procedure: LEFT HEART CATHETERIZATION WITH CORONARY ANGIOGRAM;  Surgeon: Kathleene Hazel, MD;  Location: [facility] CATH LAB;  Service: Cardiovascular;  Laterality: N/A;   LEFT HEART CATHETERIZATION WITH CORONARY ANGIOGRAM N/A 10/31/2014   Procedure: LEFT HEART CATHETERIZATION WITH CORONARY ANGIOGRAM;  Surgeon: Tonny Bollman, MD;  Location: [facility] CATH LAB;  Service: Cardiovascular;  Laterality: N/A;   PTCA     Hx of it.    SIGMOIDOSCOPY     TEE WITHOUT CARDIOVERSION N/A 11/03/2014   Procedure: TRANSESOPHAGEAL ECHOCARDIOGRAM (TEE);  Surgeon: Kerin Perna, MD;  Location: [facility] OR;  Service: Open Heart Surgery;  Laterality: N/A;   TOTAL KNEE ARTHROPLASTY  02/09/2011   Right knee   TOTAL KNEE ARTHROPLASTY Left 04/11/2016   Procedure: LEFT TOTAL KNEE ARTHROPLASTY;  Surgeon: Dannielle Huh, MD;  Location: [facility] OR;  Service: Orthopedics;  Laterality: Left;   UPPER GASTROINTESTINAL ENDOSCOPY      Allergies: Allergies  Allergen Reactions  Protamine Rash    Hypotension, Rash, unstable vitals.     Statins Other (See Comments)    Caused soreness (Lipitor, Zocor)   Adhesive [Tape] Hives   Imodium [Loperamide] Itching and Rash   Rosuvastatin Calcium Other (See Comments)    Caused soreness (Lipitor, Zocor) 12/28/2020 Pt can tolerate low dose    Outpatient Meds: Current Outpatient Medications  Medication Sig Dispense Refill   acetaminophen (TYLENOL) 500 MG tablet Take 500-1,000 mg by mouth 2 (two)  times daily as needed (for pain).      albuterol (VENTOLIN HFA) 108 (90 Base) MCG/ACT inhaler Inhale 2 puffs into the lungs every 6 (six) hours as needed for wheezing or shortness of breath. 18 g 0   cholecalciferol (VITAMIN D3) 25 MCG (1000 UNIT) tablet Take 1,000 Units by mouth daily.     ferrous sulfate 325 (65 FE) MG tablet Take 325 mg by mouth daily with breakfast.     furosemide (LASIX) 20 MG tablet Take 1 tablet by mouth once daily 30 tablet 3   glucose blood (ONETOUCH ULTRA) test strip USE 1 STRIP TO CHECK GLUCOSE ONCE DAILY 100 each 3   isosorbide mononitrate (IMDUR) 30 MG 24 hr tablet Take 1 tablet by mouth once daily 90 tablet 1   vitamin B-12 (CYANOCOBALAMIN) 1000 MCG tablet Take 1,000 mcg by mouth daily.     ELIQUIS 5 MG TABS tablet Take 1 tablet by mouth twice daily 180 tablet 0   nitroGLYCERIN (NITROSTAT) 0.4 MG SL tablet DISSOLVE ONE TABLET UNDER THE TONGUE EVERY 5 MINUTES AS NEEDED FOR CHEST PAIN.  DO NOT EXCEED A TOTAL OF 3 DOSES IN 15 MINUTES 25 tablet 2   valsartan (DIOVAN) 40 MG tablet Take 0.5 tablets (20 mg total) by mouth daily. (Patient taking differently: Take 20 mg by mouth daily. Only takes as needed) 45 tablet 3   Current Facility-Administered Medications  Medication Dose Route Frequency Provider Last Rate Last Admin   0.9 %  sodium chloride infusion  500 mL Intravenous Once Danis, Starr Lake III, MD          ___________________________________________________________________ Objective   Exam:  BP (!) 158/80   Pulse 73   Temp 97.8 F (36.6 C) (Temporal)   Ht 5' 5.5" (1.664 m)   Wt 182 lb (82.6 kg)   SpO2 97%   BMI 29.83 kg/m   CV: regular , S1/S2 Resp: clear to auscultation bilaterally, normal RR and effort noted GI: soft, no tenderness, with active bowel sounds.   Assessment: Encounter Diagnoses  Name Primary?   Chronic anticoagulation Yes   Iron deficiency anemia, unspecified iron deficiency anemia type    Labs:  As above     Latest Ref  Rng & Units 11/02/2022    3:56 PM 10/28/2022   11:33 AM 10/21/2022   12:13 PM  CBC  WBC 4.0 - 10.5 K/uL 6.5  6.4  5.9   Hemoglobin 13.0 - 17.0 g/dL 9.4  8.9  6.2   Hematocrit 39.0 - 52.0 % 32.0  30.0  23.2   Platelets 150 - 400 K/uL 244   290       Latest Ref Rng & Units 11/02/2022    3:56 PM 10/21/2022   12:13 PM 08/22/2022    9:21 AM  CMP  Glucose 70 - 99 mg/dL 161  096  045   BUN 8 - 23 mg/dL 24  20  19    Creatinine 0.61 - 1.24 mg/dL 4.09  8.11  9.14  Sodium 135 - 145 mmol/L 139  138  141   Potassium 3.5 - 5.1 mmol/L 4.2  4.0  4.7   Chloride 98 - 111 mmol/L 107  108  107   CO2 22 - 32 mmol/L 23  22  22    Calcium 8.9 - 10.3 mg/dL 9.0  9.0  9.2   Total Protein 6.5 - 8.1 g/dL 6.3   6.4   Total Bilirubin 0.3 - 1.2 mg/dL 0.5   0.3   Alkaline Phos 38 - 126 U/L 49   68   AST 15 - 41 U/L 20   13   ALT 0 - 44 U/L 14   12      Plan: Colonoscopy EGD***   The patient is appropriate for an endoscopic procedure in the ambulatory setting.   - Amada Jupiter, MD

## 2022-11-08 NOTE — Op Note (Signed)
[facility] Patient Name: [name] Procedure Date: 11/08/2022 3:13 PM MRN: [id number] Endoscopist: Sherilyn Cooter L. Myrtie Neither , MD, [id number] Age: 88 Referring MD:  Date of Birth: 14-Oct-1934 Gender: Male Account #: [id number] Procedure:                Colonoscopy Indications:              Unexplained iron deficiency anemia                           See EGD report and recent GI consult note for                            further clinical details Medicines:                Monitored Anesthesia Care Procedure:                Pre-Anesthesia Assessment:                           - Prior to the procedure, a History and Physical                            was performed, and patient medications and                            allergies were reviewed. The patient's tolerance of                            previous anesthesia was also reviewed. The risks                            and benefits of the procedure and the sedation                            options and risks were discussed with the patient.                            All questions were answered, and informed consent                            was obtained. Prior Anticoagulants: The patient has                            taken Eliquis (apixaban), last dose was 2 days                            prior to procedure. ASA Grade Assessment: III - A                            patient with severe systemic disease. After                            reviewing the risks and benefits, the patient was  deemed in satisfactory condition to undergo the                            procedure.                           After obtaining informed consent, the colonoscope                            was passed under direct vision. Throughout the                            procedure, the patient's blood pressure, pulse, and                            oxygen saturations were monitored continuously. The                            CF  [id number] #[id number] was introduced through the anus                            and advanced to the the cecum, identified by                            appendiceal orifice and ileocecal valve. The                            colonoscopy was extremely difficult due to multiple                            diverticula in the colon, a redundant colon,                            significant looping and a tortuous colon.                            Successful completion of the procedure was aided by                            using manual pressure, straightening and shortening                            the scope to obtain bowel loop reduction and                            lavage. The patient tolerated the procedure well.                            The quality of the bowel preparation was good. The                            ileocecal valve, appendiceal orifice, and rectum  were photographed. Scope In: 3:31:00 PM Scope Out: 4:15:18 PM Scope Withdrawal Time: 0 hours 36 minutes 33 seconds  Total Procedure Duration: 0 hours 44 minutes 18 seconds  Findings:                 The perianal and digital rectal examinations were                            normal.                           Many diverticula were found in the entire colon.                            (With associated marked angulation and tortuosity                            of the left colon)                           Two sessile polyps were found in the proximal                            ascending colon. The polyps were 4 to 5 mm in size.                            These polyps were removed with a cold snare.                            Resection and retrieval were complete.                           A 12-15 mm polyp was found in the proximal to mid                            rectum. The polyp was pedunculated on a short thick                            stalk. Visualization was limited in scope position                             was very challenging due to the tortuous anatomy,                            colon spasm, prolapsing of the polyp and difficulty                            maintaining insufflated air. The initial intention                            was to remove this polyp by hot snare polypectomy,                            however with these technical challenges, it was  not                            felt safe to proceed with that. Biopsies were taken                            with a cold forceps for histology (to rule out                            malignancy, which appears unlikely based on its                            appearance). Note, the polyp is not ulcerated,                            inflamed or otherwise with stigmata of bleeding to                            suggest that it is contributing to this patient's                            anemia. (Polyp photograph showing scant fresh blood                            was only after passing the scope through this area                            multiple times and manipulation of the polyp.)                           Internal hemorrhoids were found.                           The exam was otherwise without abnormality. Complications:            No immediate complications. Estimated Blood Loss:     Estimated blood loss was minimal. Impression:               - Diverticulosis in the entire examined colon.                           - Two 4 to 5 mm polyps in the proximal ascending                            colon, removed with a cold snare. Resected and                            retrieved.                           - One 12-15 mm polyp in the mid rectum. Biopsied.                           - Internal hemorrhoids.                           -  The examination was otherwise normal. Recommendation:           - Patient has a contact number available for                            emergencies. The signs and symptoms of potential                             delayed complications were discussed with the                            patient. Return to normal activities tomorrow.                            Written discharge instructions were provided to the                            patient.                           - Resume previous diet.                           - Resume Eliquis (apixaban) at prior dose tomorrow.                           - Await pathology results.                           - No repeat routine surveillance colonoscopy                            recommended due to this patient's age.                           - See additional recommendations and EGD report. Jilleen Essner L. Myrtie Neither, MD 11/08/2022 4:31:25 PM This report has been signed electronically.

## 2022-11-08 NOTE — Op Note (Signed)
[facility] Patient Name: [name] Procedure Date: 11/08/2022 3:06 PM MRN: [id number] Endoscopist: Sherilyn Cooter L. Myrtie Neither , MD, [id number] Age: 88 Referring MD:  Date of Birth: 1934/07/13 Gender: Male Account #: [id number] Procedure:                Upper GI endoscopy Indications:              Unexplained iron deficiency anemia                           Clinical details and recent office consult note.                           Anemia worsened afterward despite oral iron, led to                            ED visit requiring transfusion PRBCs. ED provider                            note indicates patient heme-negative on that exam.                            B12 and folic acid normal Medicines:                Monitored Anesthesia Care Procedure:                Pre-Anesthesia Assessment:                           - Prior to the procedure, a History and Physical                            was performed, and patient medications and                            allergies were reviewed. The patient's tolerance of                            previous anesthesia was also reviewed. The risks                            and benefits of the procedure and the sedation                            options and risks were discussed with the patient.                            All questions were answered, and informed consent                            was obtained. Prior Anticoagulants: The patient has                            taken Eliquis (apixaban), last dose was 2 days  prior to procedure. ASA Grade Assessment: III - A                            patient with severe systemic disease. After                            reviewing the risks and benefits, the patient was                            deemed in satisfactory condition to undergo the                            procedure.                           After obtaining informed consent, the endoscope was                             passed under direct vision. Throughout the                            procedure, the patient's blood pressure, pulse, and                            oxygen saturations were monitored continuously. The                            Olympus Scope [id number] was introduced through the                            mouth, and advanced to the second part of duodenum.                            The upper GI endoscopy was accomplished without                            difficulty. The patient tolerated the procedure                            well. Scope In: Scope Out: Findings:                 The lower third of the esophagus was tortuous.                           A large hiatal hernia was present. At least a third                            of the stomach is herniated,                           Multiple dispersed erosions with stigmata of recent                            bleeding were found in the  cardia and in the                            gastric fundus as well as proximal gastric body                            (that is, the portion of the stomach that was                            herniated). These are typical "Cameron erosions".                            Several biopsies were obtained in the gastric body                            and in the gastric antrum with cold forceps for                            histology (rule out H. pylori as a cause for the                            erosions).                           The exam of the stomach was otherwise normal except                            for some patchy nonspecific mucosal cobblestoning.                           The examined duodenum was normal. Complications:            No immediate complications. Estimated Blood Loss:     Estimated blood loss was minimal. Impression:               - Tortuous esophagus.                           - Large hiatal hernia.                           - Gastric erosions with stigmata of recent  bleeding.                           - Normal examined duodenum.                           - Several biopsies were obtained in the gastric                            body and in the gastric antrum.                           If this patient's IDA is on the basis of occult GI  blood loss, then these erosions within the                            herniated stomach appeared to account for the IDA,                            as no source of GI blood loss was found on the                            colonoscopy. Recommendation:           - Patient has a contact number available for                            emergencies. The signs and symptoms of potential                            delayed complications were discussed with the                            patient. Return to normal activities tomorrow.                            Written discharge instructions were provided to the                            patient.                           - Resume previous diet.                           - Continue present medications.                           - Await pathology results.                           - Resume Eliquis (apixaban) at prior dose tomorrow.                           - See the other procedure note for documentation of                            additional recommendations.                           -CBC and iron studies in 3 to 4 weeks with primary                            care. If iron levels and hemoglobin cannot be                            maintained in a near normal range with oral iron,  then intravenous iron should be used. Cesareo Vickrey L. Myrtie Neither, MD 11/08/2022 4:23:30 PM This report has been signed electronically.

## 2022-11-08 NOTE — Progress Notes (Unsigned)
Sedate, gd SR, tolerated procedure well, VSS, report to RN 

## 2022-11-08 NOTE — Progress Notes (Unsigned)
Called to room to assist during endoscopic procedure.  Patient ID and intended procedure confirmed with present staff. Received instructions for my participation in the procedure from the performing physician.  

## 2022-11-09 ENCOUNTER — Other Ambulatory Visit: Payer: Self-pay | Admitting: Cardiovascular Disease

## 2022-11-09 ENCOUNTER — Telehealth: Payer: Self-pay

## 2022-11-09 NOTE — Telephone Encounter (Signed)
  Follow up Call-     11/08/2022    2:12 PM  Call back number  Post procedure Call Back phone  # [phone]  Permission to leave phone message Yes     Patient questions:  Do you have a fever, pain , or abdominal swelling? No. Pain Score  0 *  Have you tolerated food without any problems? Yes.    Have you been able to return to your normal activities? Yes.    Do you have any questions about your discharge instructions: Diet   No. Medications  No. Follow up visit  No.  Do you have questions or concerns about your Care? No.  Actions: * If pain score is 4 or above: No action needed, pain <4.

## 2022-11-11 MED FILL — Iron Sucrose Inj 20 MG/ML (Fe Equiv): INTRAVENOUS | Qty: 10 | Status: AC

## 2022-11-12 ENCOUNTER — Inpatient Hospital Stay: Payer: Medicare HMO | Attending: Physician Assistant

## 2022-11-12 VITALS — BP 150/85 | HR 55 | Temp 98.0°F | Resp 16

## 2022-11-12 DIAGNOSIS — D509 Iron deficiency anemia, unspecified: Secondary | ICD-10-CM | POA: Diagnosis not present

## 2022-11-12 MED ORDER — SODIUM CHLORIDE 0.9 % IV SOLN
200.0000 mg | Freq: Once | INTRAVENOUS | Status: AC
  Administered 2022-11-12: 200 mg via INTRAVENOUS
  Filled 2022-11-12: qty 200

## 2022-11-12 MED ORDER — SODIUM CHLORIDE 0.9 % IV SOLN: Freq: Once | INTRAVENOUS | Status: AC

## 2022-11-12 NOTE — Patient Instructions (Addendum)
Iron Sucrose Injection What is this medication? IRON SUCROSE (EYE ern SOO krose) treats low levels of iron (iron deficiency anemia) in people with kidney disease. Iron is a mineral that plays an important role in making red blood cells, which carry oxygen from your lungs to the rest of your body. This medicine may be used for other purposes; ask your health care provider or pharmacist if you have questions. COMMON BRAND NAME(S): Venofer What should I tell my care team before I take this medication? They need to know if you have any of these conditions: Anemia not caused by low iron levels Heart disease High levels of iron in the blood Kidney disease Liver disease An unusual or allergic reaction to iron, other medications, foods, dyes, or preservatives Pregnant or trying to get pregnant Breastfeeding How should I use this medication? This medication is for infusion into a vein. It is given in a hospital or clinic setting. Talk to your care team about the use of this medication in children. While this medication may be prescribed for children as young as 2 years for selected conditions, precautions do apply. Overdosage: If you think you have taken too much of this medicine contact a poison control center or emergency room at once. NOTE: This medicine is only for you. Do not share this medicine with others. What if I miss a dose? Keep appointments for follow-up doses. It is important not to miss your dose. Call your care team if you are unable to keep an appointment. What may interact with this medication? Do not take this medication with any of the following: Deferoxamine Dimercaprol Other iron products This medication may also interact with the following: Chloramphenicol Deferasirox This list may not describe all possible interactions. Give your health care provider a list of all the medicines, herbs, non-prescription drugs, or dietary supplements you use. Also tell them if you smoke,  drink alcohol, or use illegal drugs. Some items may interact with your medicine. What should I watch for while using this medication? Visit your care team regularly. Tell your care team if your symptoms do not start to get better or if they get worse. You may need blood work done while you are taking this medication. You may need to follow a special diet. Talk to your care team. Foods that contain iron include: whole grains/cereals, dried fruits, beans, or peas, leafy green vegetables, and organ meats (liver, kidney). What side effects may I notice from receiving this medication? Side effects that you should report to your care team as soon as possible: Allergic reactions--skin rash, itching, hives, swelling of the face, lips, tongue, or throat Low blood pressure--dizziness, feeling faint or lightheaded, blurry vision Shortness of breath Side effects that usually do not require medical attention (report to your care team if they continue or are bothersome): Flushing Headache Joint pain Muscle pain Nausea Pain, redness, or irritation at injection site This list may not describe all possible side effects. Call your doctor for medical advice about side effects. You may report side effects to FDA at 1-800-FDA-1088. Where should I keep my medication? This medication is given in a hospital or clinic and will not be stored at home. NOTE: This sheet is a summary. It may not cover all possible information. If you have questions about this medicine, talk to your doctor, pharmacist, or health care provider.  2023 Elsevier/Gold Standard (2020-10-08 00:00:00) Pt tolerated infusion well no s/s of side effects noted.

## 2022-11-18 ENCOUNTER — Other Ambulatory Visit: Payer: Self-pay

## 2022-11-18 ENCOUNTER — Inpatient Hospital Stay (HOSPITAL_BASED_OUTPATIENT_CLINIC_OR_DEPARTMENT_OTHER): Payer: Medicare HMO

## 2022-11-18 VITALS — BP 160/62 | HR 65 | Temp 98.0°F | Resp 17

## 2022-11-18 DIAGNOSIS — D509 Iron deficiency anemia, unspecified: Secondary | ICD-10-CM | POA: Diagnosis not present

## 2022-11-18 MED ORDER — SODIUM CHLORIDE 0.9 % IV SOLN: Freq: Once | INTRAVENOUS | Status: AC

## 2022-11-18 MED ORDER — SODIUM CHLORIDE 0.9 % IV SOLN
200.0000 mg | Freq: Once | INTRAVENOUS | Status: AC
  Administered 2022-11-18: 200 mg via INTRAVENOUS
  Filled 2022-11-18: qty 200

## 2022-11-18 NOTE — Progress Notes (Signed)
Pt declined to stay for post observation.  Discharge to lobby in stable condition 

## 2022-11-23 ENCOUNTER — Telehealth: Payer: Self-pay | Admitting: Physician Assistant

## 2022-11-25 ENCOUNTER — Inpatient Hospital Stay: Payer: Medicare HMO

## 2022-11-25 ENCOUNTER — Other Ambulatory Visit: Payer: Self-pay

## 2022-11-25 VITALS — BP 144/70 | HR 62 | Temp 97.7°F | Resp 16

## 2022-11-25 DIAGNOSIS — D509 Iron deficiency anemia, unspecified: Secondary | ICD-10-CM

## 2022-11-25 MED ORDER — SODIUM CHLORIDE 0.9 % IV SOLN: Freq: Once | INTRAVENOUS | Status: AC

## 2022-11-25 MED ORDER — SODIUM CHLORIDE 0.9 % IV SOLN
200.0000 mg | Freq: Once | INTRAVENOUS | Status: AC
  Administered 2022-11-25: 200 mg via INTRAVENOUS
  Filled 2022-11-25: qty 200

## 2022-11-25 NOTE — Patient Instructions (Signed)

## 2022-11-25 NOTE — Progress Notes (Signed)
Pt declined to be observed for 30 minutes post Venofer infusion. Pt tolerated trtmt well w/out incident. VSS at discharge.  Ambulatory w/ cane to lobby.   

## 2022-11-28 NOTE — Progress Notes (Unsigned)
Cardiology Office Note:    Date:  11/30/2022   ID:  [name], DOB 06-May-1935, MRN [id number]  PCP:  Excell Seltzer, MD   [facility] Providers Cardiologist:  Verne Carrow, MD     Referring MD: Excell Seltzer, MD   Chief Complaint: shortness of breath and fatigue  History of Present Illness:    [name] is a very pleasant 88 y.o. male with a hx of CAD s/p CABG 2016, coronary stent placement prior to CABG, HTN, HLD, PAF, and GERD  He has been followed for CAD since 2005 when he had stent to LAD. He was evaluated April 2016 for chest pain. Cardiac catheterization April 2016 with multivessel disease with severe 95% distal left main stenosis.  He underwent 4V CABG (LIMA-LAD, SVG-DX, SVG-PDA, SVG-OM) April 2016.  Echo April 2017 with normal LV function and no significant valvular disease.  Seen in July 2019 for chest pain, palpitations, and uncontrolled blood pressure.  Nuclear stress test July 2019 with no ischemia.  Echo July 2019 with LVEF 60 to 65% with grade 1 diastolic dysfunction, mild MR.  Cardiac monitor July 2019 with type II second-degree AV block and junctional bradycardia with frequent PACs. His beta-blocker was stopped. He felt better after stopping his beta-blocker.  He had trouble speaking in December 2020 and EMS strips showed sinus with PACs.  EKG in ED was sinus. Echo February 2020 with LVEF 60 to 65%, mild LVH, no significant valve disease.  Cardiac monitor March 2020 with sinus, Mobitz 1 block, no atrial fib.  He was admitted to [facility] June 2022 with COVID-pneumonia.  He had atrial fib while admitted.  Plavix was stopped and Eliquis was started.  Echo June 2022 with LVEF 70 to 75%, moderate LVH, normal RV function, trivial MR, trivial AI.  Seen in cardiology clinic visit 09/21/2021 by Dr. Clifton Logon. He reported snoring and daytime somnolence but did not want to schedule a sleep study. He was in a fib with well controlled ventricular rate.  He contacted our  office on 08/15/22 to report shortness of breath when moving around for approximately one month along with slight chest pain.  Reported BP was labile.  Also having dizziness, headache, and blurred vision.  Was scheduled for office visit. Reported to triage nurse that he had stopped taking his BP meds and Lasix. She advised him to restart Lasix and ER precautions were advised.   Seen by me on 08/22/22 for evaluation of symptoms as stated above and is accompanied by his wife.Reports he has been less active this winter and initially thought symptoms were 2/2 deconditioning, but feels that symptoms are more significant than just from being less active. Frequently walks 150 feet to his son's house next door and has to stop to rest when he gets there. Also has discomfort in his chest that he feels is stable. Chest discomfort does not worsen with shortness of breath. Took sl ntg x 1 last week with relief. Thought that lower BP may be contributing to symptoms, so he stopped lisinopril and amlodipine. No orthopnea, PND, edema. O2 sats consistently 94-100. No palpitations or feelings of his heart racing. Occasional lighteadedness when he stands too quickly. Admits to not drinking enough water - drinks tea, juice, milk, maybe 1-2 water bottles daily. Does not like taking Eliquis, feels that it has caused some of his symptoms. No bleeding concerns. He was advised to start valsartan 20 mg daily to maintain good BP control. Concern for fatigue,  worsening DOE symptoms of unstable angina. Cardiac tests ordered and lab work completed. Labs revealed drop in hct and hgb and I recommended that he follow-up with PCP. Hgb went from 13.4 to 8.2.   He was found to have microcytic anemia with iron level of 17 and iron saturation of 3.2%. He was started on po iron.   Cardiac PET CT performed 09/14/2022 with no ischemia. Echo 09/20/2022 with LVEF 65 to 70%, moderate LVH, severe left and right atrial enlargement, trivial MR, mild TR.   Seen  in follow-up by Dr. Clifton Flay on 10/20/2022 for ongoing dyspnea and fatigue.  His wife felt that he looked pale. He continued to refuse statin for LDL 94, considered referral to Lipid clinic but he has continued to be resistant. Did not wish to pursue sleep study. Concern that symptoms are 2/2 Eliquis. Stroke risk was reviewed and he was going to determine if he wanted to accept that risk and stop OAC.  CBC was obtained at office visit and Hgb was 6.0.  He was advised to go to the ED by Dr. Clifton Marshaun.  Today, he is here with his wife. He is feeling much better and says he finally feels like he can resume normal activities.  Reports he has been placed on IV iron infusion and is followed by GI. Found to have hiatal hernia which may have small amount of bleeding at times. He felt this was all exacerbated by Eliquis and has stopped taking it. Lengthy discussion about antiplatelet and anticoagulant differences and potential bleeding risks. Stroke risk off OAC with AF reviewed with patient and wife and all questions answered. Occasional chest discomfort that his wife thinks is reflux/hiatal hernia. He had no improvement with ntg and pain has not worsened, does not occur with exertion.  He denies shortness of breath, palpitations, orthopnea, PND, presyncope, syncope, edema. No bleeding concerns at present.   Past Medical History:  Diagnosis Date   CAD (coronary artery disease)    s/p stent mid LAD 2005 // s/p CABG 2016 // Myoview 8/19:  EF 64, no ischemia   Coronary artery disease involving native coronary artery of native heart without angina pectoris    Depression    DJD (degenerative joint disease)    DM type 2 with diabetic peripheral neuropathy (HCC) 03/10/2017   E coli bacteremia 04/20/2016   Essential hypertension 04/20/2016   GERD (gastroesophageal reflux disease)    History of echocardiogram    Echo 8/19: Moderate LVH, EF 60-65, normal wall motion, grade 1 diastolic dysfunction, mild MR, mild LAE,  normal RVSF, mild TR, PASP 25   History of hiatal hernia    HTN (hypertension)    Hyperlipidemia    Neuropathy due to type 2 diabetes mellitus (HCC) 03/10/2017   PERCUTANEOUS TRANSLUMINAL CORONARY ANGIOPLASTY, HX OF 11/26/2007   Annotation: With CYPHER STENT. Qualifier: Diagnosis of  By: Genelle Gather CMA, [place]     Pre-diabetes    S/P CABG x 4 11/03/2014   S/P total knee replacement 04/11/2016   Sepsis due to Escherichia coli (E. coli) (HCC) 04/20/2016   Tinea pedis 09/08/2017    Past Surgical History:  Procedure Laterality Date   BRAIN SURGERY     CARDIAC CATHETERIZATION     COLONOSCOPY     CORONARY ARTERY BYPASS GRAFT N/A 11/03/2014   Procedure: CORONARY ARTERY BYPASS GRAFTING (CABG), ON PUMP, TIMES FOUR, USING LEFT INTERNAL MAMMARY, RIGHT GREATER SAPHENOUS VEIN HARVESTED ENDOSCOPICALLY;  Surgeon: Kerin Perna, MD;  Location: [facility] OR;  Service:  Open Heart Surgery;  Laterality: N/A;  LIMA-LAD; SVG-DIAG; SVG-OM; SVG-RCA   CRANIOTOMY N/A 01/18/2014   Procedure: CRANIOTOMY HEMATOMA EVACUATION SUBDURAL;  Surgeon: Hewitt Shorts, MD;  Location[facility] OR;  Service: Neurosurgery;  Laterality: N/A;   kidney stone removal     KNEE SURGERY     LEFT HEART CATHETERIZATION WITH CORONARY ANGIOGRAM N/A 07/27/2011   Procedure: LEFT HEART CATHETERIZATION WITH CORONARY ANGIOGRAM;  Surgeon: Kathleene Hazel, MD;  Location[facility] C[facility] LAB;  Service: Cardiovascular;  Laterality: N/A;   LEFT HEART CATHETERIZATION WITH CORONARY ANGIOGRAM N/A 10/31/2014   Procedure: LEFT HEART CATHETERIZATION WITH CORONARY ANGIOGRAM;  Surgeon: Tonny Bollman, MD;  Location: [facility] CATH LAB;  Service: Cardiovascular;  Laterality: N/A;   PTCA     Hx of it.    SIGMOIDOSCOPY     TEE WITHOUT CARDIOVERSION N/A 11/03/2014   Procedure: TRANSESOPHAGEAL ECHOCARDIOGRAM (TEE);  Surgeon: Kerin Perna, MD;  Location: [facility] OR;  Service: Open Heart Surgery;  Laterality: N/A;   TOTAL KNEE ARTHROPLASTY  02/09/2011   Right knee   TOTAL KNEE  ARTHROPLASTY Left 04/11/2016   Procedure: LEFT TOTAL KNEE ARTHROPLASTY;  Surgeon: Dannielle Huh, MD;  Location: [facility] OR;  Service: Orthopedics;  Laterality: Left;   UPPER GASTROINTESTINAL ENDOSCOPY      Current Medications: Current Meds  Medication Sig   acetaminophen (TYLENOL) 500 MG tablet Take 500-1,000 mg by mouth 2 (two) times daily as needed (for pain).    albuterol (VENTOLIN HFA) 108 (90 Base) MCG/ACT inhaler Inhale 2 puffs into the lungs every 6 (six) hours as needed for wheezing or shortness of breath.   aspirin EC 81 MG tablet Take 81 mg by mouth daily. Swallow whole.   cholecalciferol (VITAMIN D3) 25 MCG (1000 UNIT) tablet Take 1,000 Units by mouth daily.   Evolocumab (REPATHA SURECLICK) 140 MG/ML SOAJ Inject 140 mg into the skin every 14 (fourteen) days.   furosemide (LASIX) 20 MG tablet Take 1 tablet by mouth once daily   glucose blood (ONETOUCH ULTRA) test strip USE 1 STRIP TO CHECK GLUCOSE ONCE DAILY   isosorbide mononitrate (IMDUR) 30 MG 24 hr tablet Take 1 tablet by mouth once daily   nitroGLYCERIN (NITROSTAT) 0.4 MG SL tablet DISSOLVE ONE TABLET UNDER THE TONGUE EVERY 5 MINUTES AS NEEDED FOR CHEST PAIN.  DO NOT EXCEED A TOTAL OF 3 DOSES IN 15 MINUTES   rosuvastatin (CRESTOR) 5 MG tablet Take 1 tablet (5 mg total) by mouth daily.   valsartan (DIOVAN) 40 MG tablet Take 0.5 tablets (20 mg total) by mouth daily. (Patient taking differently: Take 20 mg by mouth daily. Only takes as needed)   vitamin B-12 (CYANOCOBALAMIN) 1000 MCG tablet Take 1,000 mcg by mouth daily.   [DISCONTINUED] ELIQUIS 5 MG TABS tablet Take 1 tablet by mouth twice daily   [DISCONTINUED] ferrous sulfate 325 (65 FE) MG tablet Take 325 mg by mouth daily with breakfast.     Allergies:   Protamine, Statins, Adhesive [tape], Imodium [loperamide], and Rosuvastatin calcium   Social History   Socioeconomic History   Marital status: Married    Spouse name: Not on file   Number of children: 3   Years of education:  Not on file   Highest education level: Not on file  Occupational History   Occupation: retired  Tobacco Use   Smoking status: Former    Types: Cigarettes    Quit date: 1985    Years since quitting: 39.4   Smokeless tobacco: Former  Types: Dorna Bloom    Quit date: 29   Tobacco comments:    quit in 1985. smoked 1ppd for 35 years   Vaping Use   Vaping Use: Never used  Substance and Sexual Activity   Alcohol use: No   Drug use: No   Sexual activity: Never  Other Topics Concern   Not on file  Social History Narrative   Married with children. 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 10:20 am    Has living will,  HCPOA: jerrie Michl, full code (reviewed 23)   Social Determinants of Health   Financial Resource Strain: Low Risk  (06/22/2021)   Overall Financial Resource Strain (CARDIA)    Difficulty of Paying Living Expenses: Not hard at all  Food Insecurity: No Food Insecurity (11/02/2022)   Hunger Vital Sign    Worried About Running Out of Food in the Last Year: Never true    Ran Out of Food in the Last Year: Never true  Transportation Needs: No Transportation Needs (11/02/2022)   PRAPARE - Administrator, Civil Service (Medical): No    Lack of Transportation (Non-Medical): No  Physical Activity: Inactive (06/22/2021)   Exercise Vital Sign    Days of Exercise per Week: 0 days    Minutes of Exercise per Session: 0 min  Stress: No Stress Concern Present (06/22/2021)   [employer] of Occupational Health - Occupational Stress Questionnaire    Feeling of Stress : Not at all  Social Connections: Socially Integrated (06/22/2021)   Social Connection and Isolation Panel [NHANES]    Frequency of Communication with Friends and Family: More than three times a week    Frequency of Social Gatherings with Friends and Family: Three times a week    Attends Religious Services: More than 4 times per year    Active Member of Clubs or  Organizations: Yes    Attends Engineer, structural: More than 4 times per year    Marital Status: Married     Family History: The patient's family history includes COPD in his mother; Colon cancer (age of onset: 61) in his daughter; Heart attack in his brother, father, and sister; Heart disease in his father and sister; Heart failure in his mother; Hypertension in his son and son.  ROS:   Please see the history of present illness.  All other systems reviewed and are negative.  Labs/Other Studies Reviewed:    The following studies were reviewed today:  Echo 09/20/22 1. Mild intracavitary gradient. Peak velocity 0.9 m/s. Peak gradient 3.2  mmHg. Left ventricular ejection fraction, by estimation, is 65 to 70%. The  left ventricle has normal function. The left ventricle has no regional  wall motion abnormalities. There  is moderate concentric left ventricular hypertrophy. Left ventricular  diastolic function could not be evaluated.   2. Right ventricular systolic function is normal. The right ventricular  size is mildly enlarged. There is mildly elevated pulmonary artery  systolic pressure.   3. Left atrial size was severely dilated.   4. Right atrial size was severely dilated.   5. The mitral valve is normal in structure. Trivial mitral valve  regurgitation. No evidence of mitral stenosis.   6. Tricuspid valve regurgitation is mild to moderate.   7. The aortic valve is tricuspid. Aortic valve regurgitation is not  visualized. No aortic stenosis is present.   8. Aortic dilatation noted. There is mild dilatation of the aortic root,  measuring 36  mm. There is mild dilatation of the ascending aorta,  measuring 39 mm.   9. The inferior vena cava is dilated in size with >50% respiratory  variability, suggesting right atrial pressure of 8 mmHg.   Cardiac PET CT 09/14/22  Normal perfusion.  Myocardial blood flow reserve is decreased, but is unreliable in setting of prior CABG.  No  high risk markers such as drop in EF with stress or TID.  Low risk study   LV perfusion is normal. There is no evidence of ischemia. There is no evidence of infarction.   Rest left ventricular function is normal. Rest EF: 66 %. Stress left ventricular function is normal. Stress EF: 69 %. End diastolic cavity size is normal. End systolic cavity size is normal.   Myocardial blood flow was computed to be 1.60ml/g/min at rest and 1.59ml/g/min at stress. Global myocardial blood flow reserve was 1.45 and was abnormal, though unreliable in setting of prior CABG   Coronary calcium was present on the attenuation correction CT images. Severe coronary calcifications were present. Coronary calcifications were present in the left anterior descending artery, left circumflex artery and right coronary artery distribution(s).   The study is normal. The study is low risk.   Echo 12/31/20 1. Left ventricular ejection fraction, by estimation, is 70 to 75%. The  left ventricle has hyperdynamic function. The left ventricle has no  regional wall motion abnormalities. There is moderate left ventricular  hypertrophy. Left ventricular diastolic  function could not be evaluated.   2. Right ventricular systolic function is normal. The right ventricular  size is normal. There is normal pulmonary artery systolic pressure.   3. The mitral valve is normal in structure. Trivial mitral valve  regurgitation. No evidence of mitral stenosis.   4. The aortic valve is tricuspid. Aortic valve regurgitation is trivial.  Mild aortic valve sclerosis is present, with no evidence of aortic valve  stenosis.   5. The inferior vena cava is normal in size with greater than 50%  respiratory variability, suggesting right atrial pressure of 3 mmHg.   Cardiac monitor 10/09/19 Sinus rhythm with sinus bradycardia Second degree AV block (Mobitz 1) No evidence of atrial fibrillation  Lexiscan Myoview 02/15/18 Nuclear stress EF: 64% with septal  wall hypokinesis There was no ST segment deviation noted during stress. The study is normal. This is a low risk study. No ischemia identified.   LHC 10/31/14 Final Conclusions:   1. Severe distal left main stenosis 2. Patent LAD stent with severe diffuse mid and distal LAD stenosis 3. Diffuse nonobstructive left circumflex and RCA stenoses 4. Normal LV systolic function   Recommendations: Hospital admission to a cardiac stepdown bed, IV heparin, cardiac surgery consultation. The patient has high risk coronary anatomy with severe distal left main stem stenosis. He has not had any resting anginal symptoms, that clearly his anatomy warrants at hospital admission, IV heparin for anticoagulation, and revascularization while he is in-house.    Recent Labs: 08/22/2022: TSH 1.810 11/02/2022: ALT 14; BUN 24; Creatinine 0.96; Hemoglobin 9.4; Platelet Count 244; Potassium 4.2; Sodium 139  Recent Lipid Panel    Component Value Date/Time   CHOL 148 08/22/2022 0921   TRIG 55 08/22/2022 0921   HDL 42 08/22/2022 0921   CHOLHDL 3.5 08/22/2022 0921   CHOLHDL 4 11/16/2021 0906   VLDL 12.8 11/16/2021 0906   LDLCALC 94 08/22/2022 0921     Risk Assessment/Calculations:    CHA2DS2-VASc Score = 5  {This indicates a 7.2% annual risk  of stroke. The patient's score is based upon: CHF History: 0 HTN History: 1 Diabetes History: 1 Stroke History: 0 Vascular Disease History: 1 Age Score: 2 Gender Score: 0    Physical Exam:    VS:  BP 126/84   Pulse 71   Ht 5\' 7"  (1.702 m)   Wt 179 lb 3.2 oz (81.3 kg)   SpO2 98%   BMI 28.07 kg/m     Wt Readings from Last 3 Encounters:  11/30/22 179 lb 3.2 oz (81.3 kg)  11/08/22 182 lb (82.6 kg)  11/02/22 177 lb 6.4 oz (80.5 kg)     GEN: Well nourished, well developed in no acute distress HEENT: Normal NECK: No JVD; No carotid bruits CARDIAC: Irregular RR, no murmurs, rubs, gallops RESPIRATORY:  Clear to auscultation without rales, wheezing or rhonchi   ABDOMEN: Soft, non-tender, non-distended MUSCULOSKELETAL:  No edema; No deformity. 2+ pedal pulses, equal bilaterally SKIN: Warm and dry NEUROLOGIC:  Alert and oriented x 3 PSYCHIATRIC:  Normal affect   EKG:  EKG is not ordered today  Diagnoses:    1. Coronary artery disease of native artery of native heart with stable angina pectoris (HCC)   2. Hyperlipidemia LDL goal <70   3. Dyspnea on exertion   4. Essential hypertension   5. Longstanding persistent atrial fibrillation (HCC)   6. S/P CABG x 4     Assessment and Plan:     Fatigue/DOEAnemia: Fatigue and shortness of breath have significantly improved since diagnosis of iron deficiency anemia. He is on IV iron infusions. He elected to d/c Eliquis as he felt this precipitated significant iron deficiency. Management per PCP/GI.   CAD with stable angina: S/p CABG x 4 2016. Low risk cardiac PET scan 09/14/22 with normal perfusion, normal EF, no evidence of ischemia or infarction. Rare chest pain that does not worsen with exertion. Avoiding AV nodal blocking agents 2/2 bradycardia. Has resumed aspirin 81 mg daily  since stopping OAC. LDL is elevated. Lengthy discussion about risks of worsening CAD with elevated LDL.  He is agreeable to resume rosuvastatin 5 mg and try PCSK9 inhibitor.  Will send Rx for Repatha, advised him to notify us if cost prohibitive. Continue Imdur, valsartan, rosuvastatin.  Longstanding atrial fibrillation on chronic anticoagulation: HR is well controlled. Avoiding AV nodal blockers 2/2 bradycardia.  He is no longer on OAC due to anemia. Stroke risk reviewed and he and wife verbalized understanding. Brief discussion regarding Watchman device, he did not verbalize that he wanted to pursue further discussion.   Hyperlipidemia LDL goal < 55: LDL 92 on 11/16/21. Has been intolerant of statins.  Stopped rosuvastatin when he was having fatigue to see if symptoms improved off statin.  He is agreeable to resume low dose  rosuvastatin. As noted above, would like to get LDL 55 or <.  We will send prescription for Repatha. Advised him to notify us if cost prohibitive. Will recheck lipid in 2 to 3 months.  Hypertension: BP is well controlled today. He reports better BP control and feeling better on valsartan.    Disposition: Keep your August appointment with Dr. Clifton Alvey  Medication Adjustments/Labs and Tests Ordered: Current medicines are reviewed at length with the patient today.  Concerns regarding medicines are outlined above.  Orders Placed This Encounter  Procedures   Lipid Profile   Meds ordered this encounter  Medications   Evolocumab (REPATHA SURECLICK) 140 MG/ML SOAJ    Sig: Inject 140 mg into the skin every 14 (fourteen) days.  Dispense:  2 mL    Refill:  2   rosuvastatin (CRESTOR) 5 MG tablet    Sig: Take 1 tablet (5 mg total) by mouth daily.    Dispense:  90 tablet    Refill:  3    Patient Instructions  Medication Instructions:    START Repatha (140 mg) sureclick every 14 days.  START one (1) tablet by mouth rosuvastatin ( 5 mg) every evening.   *If you need a refill on your cardiac medications before your next appointment, please call your pharmacy*   Lab Work:  Patient was given paperwork to get fasting labs in [place] the the first week of August.   If you have labs (blood work) drawn today and your tests are completely normal, you will receive your results only by: MyChart Message (if you have MyChart) OR A paper copy in the mail If you have any lab test that is abnormal or we need to change your treatment, we will call you to review the results.   Testing/Procedures:  None ordered.   Follow-Up: At [facility], you and your health needs are our priority.  As part of our continuing mission to provide you with exceptional heart care, we have created designated Provider Care Teams.  These Care Teams include your primary Cardiologist (physician) and Advanced  Practice Providers (APPs -  Physician Assistants and Nurse Practitioners) who all work together to provide you with the care you need, when you need it.  We recommend signing up for the patient portal called "MyChart".  Sign up information is provided on this After Visit Summary.  MyChart is used to connect with patients for Virtual Visits (Telemedicine).  Patients are able to view lab/test results, encounter notes, upcoming appointments, etc.  Non-urgent messages can be sent to your provider as well.   To learn more about what you can do with MyChart, go to [email or web address].    Your next appointment:   3 month(s)  Provider:   Verne Carrow, MD        Signed, Levi Aland, NP  11/30/2022 1:27 PM    [facility]

## 2022-11-30 ENCOUNTER — Telehealth: Payer: Self-pay

## 2022-11-30 ENCOUNTER — Ambulatory Visit: Payer: Medicare HMO | Attending: Nurse Practitioner | Admitting: Nurse Practitioner

## 2022-11-30 ENCOUNTER — Encounter: Payer: Self-pay | Admitting: Nurse Practitioner

## 2022-11-30 ENCOUNTER — Other Ambulatory Visit (HOSPITAL_COMMUNITY): Payer: Self-pay

## 2022-11-30 ENCOUNTER — Telehealth: Payer: Self-pay | Admitting: *Deleted

## 2022-11-30 VITALS — BP 126/84 | HR 71 | Ht 67.0 in | Wt 179.2 lb

## 2022-11-30 DIAGNOSIS — I4811 Longstanding persistent atrial fibrillation: Secondary | ICD-10-CM

## 2022-11-30 DIAGNOSIS — R0609 Other forms of dyspnea: Secondary | ICD-10-CM | POA: Diagnosis not present

## 2022-11-30 DIAGNOSIS — I10 Essential (primary) hypertension: Secondary | ICD-10-CM

## 2022-11-30 DIAGNOSIS — Z951 Presence of aortocoronary bypass graft: Secondary | ICD-10-CM | POA: Diagnosis not present

## 2022-11-30 DIAGNOSIS — E785 Hyperlipidemia, unspecified: Secondary | ICD-10-CM

## 2022-11-30 DIAGNOSIS — I25118 Atherosclerotic heart disease of native coronary artery with other forms of angina pectoris: Secondary | ICD-10-CM

## 2022-11-30 MED ORDER — ROSUVASTATIN CALCIUM 5 MG PO TABS: 5.0000 mg | ORAL_TABLET | Freq: Every day | ORAL | 3 refills | Status: DC

## 2022-11-30 MED ORDER — REPATHA SURECLICK 140 MG/ML ~~LOC~~ SOAJ: 140.0000 mg | SUBCUTANEOUS | 2 refills | Status: DC

## 2022-11-30 NOTE — Telephone Encounter (Signed)
Pharmacy Patient Advocate Encounter   Received notification from [facility] that prior authorization for REPATHA 140MG /ML is required/requested.   PA submitted on 5.22.24 to (ins) [employer] via CoverMyMeds Key or (Medicaid) confirmation # [id number]   Status is pending

## 2022-11-30 NOTE — Telephone Encounter (Signed)
Eligha Bridegroom, NP ordered Repatha/PCSK9 for pt today. Please prior auth.   Thank you in advance.

## 2022-11-30 NOTE — Patient Instructions (Signed)
Medication Instructions:    START Repatha (140 mg) sureclick every 14 days.  START one (1) tablet by mouth rosuvastatin ( 5 mg) every evening.   *If you need a refill on your cardiac medications before your next appointment, please call your pharmacy*   Lab Work:  Patient was given paperwork to get fasting labs in [place] the the first week of August.   If you have labs (blood work) drawn today and your tests are completely normal, you will receive your results only by: MyChart Message (if you have MyChart) OR A paper copy in the mail If you have any lab test that is abnormal or we need to change your treatment, we will call you to review the results.   Testing/Procedures:  None ordered.   Follow-Up: At [facility], you and your health needs are our priority.  As part of our continuing mission to provide you with exceptional heart care, we have created designated Provider Care Teams.  These Care Teams include your primary Cardiologist (physician) and Advanced Practice Providers (APPs -  Physician Assistants and Nurse Practitioners) who all work together to provide you with the care you need, when you need it.  We recommend signing up for the patient portal called "MyChart".  Sign up information is provided on this After Visit Summary.  MyChart is used to connect with patients for Virtual Visits (Telemedicine).  Patients are able to view lab/test results, encounter notes, upcoming appointments, etc.  Non-urgent messages can be sent to your provider as well.   To learn more about what you can do with MyChart, go to [email or web address].    Your next appointment:   3 month(s)  Provider:   Verne Carrow, MD

## 2022-12-01 NOTE — Telephone Encounter (Signed)
Pharmacy Patient Advocate Encounter  Prior Authorization for REPATHA has been approved.    Effective dates: 12/01/22 through 07/11/23   [name], CPhT Pharmacy Patient Advocate Specialist Direct Number: [phone] Fax: [phone]

## 2022-12-02 ENCOUNTER — Other Ambulatory Visit: Payer: Self-pay

## 2022-12-02 ENCOUNTER — Inpatient Hospital Stay: Payer: Medicare HMO

## 2022-12-02 VITALS — BP 131/73 | HR 58 | Temp 98.0°F | Resp 16

## 2022-12-02 DIAGNOSIS — D509 Iron deficiency anemia, unspecified: Secondary | ICD-10-CM | POA: Diagnosis not present

## 2022-12-02 MED ORDER — SODIUM CHLORIDE 0.9 % IV SOLN: Freq: Once | INTRAVENOUS | Status: AC

## 2022-12-02 MED ORDER — SODIUM CHLORIDE 0.9 % IV SOLN
200.0000 mg | Freq: Once | INTRAVENOUS | Status: AC
  Administered 2022-12-02: 200 mg via INTRAVENOUS
  Filled 2022-12-02: qty 200

## 2022-12-02 NOTE — Patient Instructions (Signed)

## 2022-12-06 ENCOUNTER — Telehealth: Payer: Self-pay

## 2022-12-06 NOTE — Progress Notes (Signed)
Care Management & Coordination Services Pharmacy Team  Reason for Encounter: Medication adherence  Patient is at risk for failing the Medicare Adherence STAR measure for the following medication:  Valsartan   Pharmacy fill dates/day supply this year: Valsartan  40mg  11/22/22 90ds  Patient is not more than 5 days past due for refill of the above medication.  No further action needed  Al Corpus, PharmD notified  Burt Knack, [facility] Clinical Pharmacy Assistant [phone]

## 2022-12-22 ENCOUNTER — Telehealth: Payer: Self-pay | Admitting: Physician Assistant

## 2023-01-05 ENCOUNTER — Other Ambulatory Visit: Payer: Self-pay | Admitting: Physician Assistant

## 2023-01-05 DIAGNOSIS — D509 Iron deficiency anemia, unspecified: Secondary | ICD-10-CM

## 2023-01-06 ENCOUNTER — Inpatient Hospital Stay: Payer: Medicare HMO | Admitting: Physician Assistant

## 2023-01-06 ENCOUNTER — Inpatient Hospital Stay: Payer: Medicare HMO | Attending: Physician Assistant

## 2023-01-06 ENCOUNTER — Other Ambulatory Visit: Payer: Self-pay

## 2023-01-06 ENCOUNTER — Inpatient Hospital Stay: Payer: Medicare HMO

## 2023-01-06 VITALS — BP 147/72 | HR 78 | Temp 98.2°F | Resp 18 | Ht 67.0 in | Wt 178.8 lb

## 2023-01-06 DIAGNOSIS — Z800 Family history of malignant neoplasm of digestive organs: Secondary | ICD-10-CM | POA: Diagnosis not present

## 2023-01-06 DIAGNOSIS — D500 Iron deficiency anemia secondary to blood loss (chronic): Secondary | ICD-10-CM | POA: Insufficient documentation

## 2023-01-06 DIAGNOSIS — Z87891 Personal history of nicotine dependence: Secondary | ICD-10-CM | POA: Diagnosis not present

## 2023-01-06 DIAGNOSIS — D509 Iron deficiency anemia, unspecified: Secondary | ICD-10-CM

## 2023-01-06 DIAGNOSIS — K922 Gastrointestinal hemorrhage, unspecified: Secondary | ICD-10-CM | POA: Insufficient documentation

## 2023-01-06 LAB — CBC WITH DIFFERENTIAL (CANCER CENTER ONLY)
Abs Immature Granulocytes: 0.01 10*3/uL (ref 0.00–0.07)
Basophils Absolute: 0 10*3/uL (ref 0.0–0.1)
Basophils Relative: 1 %
Eosinophils Absolute: 0.3 10*3/uL (ref 0.0–0.5)
Eosinophils Relative: 4 %
HCT: 27.6 % — ABNORMAL LOW (ref 39.0–52.0)
Hemoglobin: 8.7 g/dL — ABNORMAL LOW (ref 13.0–17.0)
Immature Granulocytes: 0 %
Lymphocytes Relative: 27 %
Lymphs Abs: 1.6 10*3/uL (ref 0.7–4.0)
MCH: 29.2 pg (ref 26.0–34.0)
MCHC: 31.5 g/dL (ref 30.0–36.0)
MCV: 92.6 fL (ref 80.0–100.0)
Monocytes Absolute: 0.7 10*3/uL (ref 0.1–1.0)
Monocytes Relative: 11 %
Neutro Abs: 3.5 10*3/uL (ref 1.7–7.7)
Neutrophils Relative %: 57 %
Platelet Count: 199 10*3/uL (ref 150–400)
RBC: 2.98 MIL/uL — ABNORMAL LOW (ref 4.22–5.81)
RDW: 16.4 % — ABNORMAL HIGH (ref 11.5–15.5)
WBC Count: 6.1 10*3/uL (ref 4.0–10.5)
nRBC: 0 % (ref 0.0–0.2)

## 2023-01-06 LAB — IRON AND IRON BINDING CAPACITY (CC-WL,HP ONLY)
Iron: 43 ug/dL — ABNORMAL LOW (ref 45–182)
Saturation Ratios: 11 % — ABNORMAL LOW (ref 17.9–39.5)
TIBC: 396 ug/dL (ref 250–450)
UIBC: 353 ug/dL (ref 117–376)

## 2023-01-06 LAB — FERRITIN: Ferritin: 21 ng/mL — ABNORMAL LOW (ref 24–336)

## 2023-01-06 MED ORDER — FERROUS SULFATE 325 (65 FE) MG PO TBEC: 325.0000 mg | DELAYED_RELEASE_TABLET | Freq: Every day | ORAL | 3 refills | Status: DC

## 2023-01-06 NOTE — Progress Notes (Signed)
[facility] [phone]   [phone]  PROGRESS NOTE  Patient Care Team: Excell Seltzer, MD as PCP - General (Family Medicine) Kathleene Hazel, MD as PCP - Cardiology (Cardiology) Jethro Bolus, MD as Consulting Physician (Ophthalmology) Vilinda Flake, [facility] (Inactive) as Pharmacist (Pharmacist)   CHIEF COMPLAINTS/PURPOSE OF CONSULTATION:  Iron deficiency anemia 2/2 GI bleeding  PRIOR TREATMENT: 11/04/2022-12/02/2022: Received IV venofer 200 mg x 5 doses  INTERVAL HISTORY:  [name] 88 y.o. male returns for a follow up for iron deficiency anemia. He was last seen on 11/02/2022. In the interim, he has received IV venofer x 5 doses. He is accompanied by his wife for this visit.   On exam today, [name] reports his energy levels have noticeably improved after receiving IV iron infusions. He is back to gardening and being outdoors. His appetite is stable without any weight loss. He stopped taking his iron pills with the iron infusions. He denies any bowel habits changes including recurrent episodes of diarrhea or constipation. He denies any signs of overt bleeding including hematochezia or melena. His shortness of breath has improved with the IV iron infusions. He denies fevers, chills, sweats, chest pain or cough. He has no other complaints. Rest of the 10 point ROS is below.   MEDICAL HISTORY:  Past Medical History:  Diagnosis Date   CAD (coronary artery disease)    s/p stent mid LAD 2005 // s/p CABG 2016 // Myoview 8/19:  EF 64, no ischemia   Coronary artery disease involving native coronary artery of native heart without angina pectoris    Depression    DJD (degenerative joint disease)    DM type 2 with diabetic peripheral neuropathy (HCC) 03/10/2017   E coli bacteremia 04/20/2016   Essential hypertension 04/20/2016   GERD (gastroesophageal reflux disease)    History of echocardiogram    Echo 8/19: Moderate LVH, EF 60-65, normal  wall motion, grade 1 diastolic dysfunction, mild MR, mild LAE, normal RVSF, mild TR, PASP 25   History of hiatal hernia    HTN (hypertension)    Hyperlipidemia    Neuropathy due to type 2 diabetes mellitus (HCC) 03/10/2017   PERCUTANEOUS TRANSLUMINAL CORONARY ANGIOPLASTY, HX OF 11/26/2007   Annotation: With CYPHER STENT. Qualifier: Diagnosis of  By: Genelle Gather CMA, [place]     Pre-diabetes    S/P CABG x 4 11/03/2014   S/P total knee replacement 04/11/2016   Sepsis due to Escherichia coli (E. coli) (HCC) 04/20/2016   Tinea pedis 09/08/2017    SURGICAL HISTORY: Past Surgical History:  Procedure Laterality Date   BRAIN SURGERY     CARDIAC CATHETERIZATION     COLONOSCOPY     CORONARY ARTERY BYPASS GRAFT N/A 11/03/2014   Procedure: CORONARY ARTERY BYPASS GRAFTING (CABG), ON PUMP, TIMES FOUR, USING LEFT INTERNAL MAMMARY, RIGHT GREATER SAPHENOUS VEIN HARVESTED ENDOSCOPICALLY;  Surgeon: Kerin Perna, MD;  Location: [facility] OR;  Service: Open Heart Surgery;  Laterality: N/A;  LIMA-LAD; SVG-DIAG; SVG-OM; SVG-RCA   CRANIOTOMY N/A 01/18/2014   Procedure: CRANIOTOMY HEMATOMA EVACUATION SUBDURAL;  Surgeon: Hewitt Shorts, MD;  Location: [facility] OR;  Service: Neurosurgery;  Laterality: N/A;   kidney stone removal     KNEE SURGERY     LEFT HEART CATHETERIZATION WITH CORONARY ANGIOGRAM N/A 07/27/2011   Procedure: LEFT HEART CATHETERIZATION WITH CORONARY ANGIOGRAM;  Surgeon: Kathleene Hazel, MD;  Location: [facility] CATH LAB;  Service: Cardiovascular;  Laterality: N/A;   LEFT HEART CATHETERIZATION WITH CORONARY  ANGIOGRAM N/A 10/31/2014   Procedure: LEFT HEART CATHETERIZATION WITH CORONARY ANGIOGRAM;  Surgeon: Tonny Bollman, MD;  Location: [facility] CATH LAB;  Service: Cardiovascular;  Laterality: N/A;   PTCA     Hx of it.    SIGMOIDOSCOPY     TEE WITHOUT CARDIOVERSION N/A 11/03/2014   Procedure: TRANSESOPHAGEAL ECHOCARDIOGRAM (TEE);  Surgeon: Kerin Perna, MD;  Location: [facility] OR;  Service: Open Heart Surgery;   Laterality: N/A;   TOTAL KNEE ARTHROPLASTY  02/09/2011   Right knee   TOTAL KNEE ARTHROPLASTY Left 04/11/2016   Procedure: LEFT TOTAL KNEE ARTHROPLASTY;  Surgeon: Dannielle Huh, MD;  Location: [facility] OR;  Service: Orthopedics;  Laterality: Left;   UPPER GASTROINTESTINAL ENDOSCOPY      SOCIAL HISTORY: Social History   Socioeconomic History   Marital status: Married    Spouse name: Not on file   Number of children: 3   Years of education: Not on file   Highest education level: Not on file  Occupational History   Occupation: retired  Tobacco Use   Smoking status: Former    Types: Cigarettes    Quit date: 1985    Years since quitting: 39.5   Smokeless tobacco: Former    Types: Chew    Quit date: 1985   Tobacco comments:    quit in 1985. smoked 1ppd for 35 years   Vaping Use   Vaping Use: Never used  Substance and Sexual Activity   Alcohol use: No   Drug use: No   Sexual activity: Never  Other Topics Concern   Not on file  Social History Narrative   Married with children. 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 10:20 am    Has living will,  HCPOA: jerrie Nusbaum, full code (reviewed 56)   Social Determinants of Health   Financial Resource Strain: Low Risk  (06/22/2021)   Overall Financial Resource Strain (CARDIA)    Difficulty of Paying Living Expenses: Not hard at all  Food Insecurity: No Food Insecurity (11/02/2022)   Hunger Vital Sign    Worried About Running Out of Food in the Last Year: Never true    Ran Out of Food in the Last Year: Never true  Transportation Needs: No Transportation Needs (11/02/2022)   PRAPARE - Administrator, Civil Service (Medical): No    Lack of Transportation (Non-Medical): No  Physical Activity: Inactive (06/22/2021)   Exercise Vital Sign    Days of Exercise per Week: 0 days    Minutes of Exercise per Session: 0 min  Stress: No Stress Concern Present (06/22/2021)   [employer] of  Occupational Health - Occupational Stress Questionnaire    Feeling of Stress : Not at all  Social Connections: Socially Integrated (06/22/2021)   Social Connection and Isolation Panel [NHANES]    Frequency of Communication with Friends and Family: More than three times a week    Frequency of Social Gatherings with Friends and Family: Three times a week    Attends Religious Services: More than 4 times per year    Active Member of Clubs or Organizations: Yes    Attends Banker Meetings: More than 4 times per year    Marital Status: Married  Catering manager Violence: Not At Risk (11/02/2022)   Humiliation, Afraid, Rape, and Kick questionnaire    Fear of Current or Ex-Partner: No    Emotionally Abused: No    Physically Abused: No    Sexually Abused: No  FAMILY HISTORY: Family History  Problem Relation Age of Onset   Heart failure Mother    COPD Mother    Heart disease Father    Heart attack Father    Heart disease Sister    Heart attack Sister    Heart attack Brother    Colon cancer Daughter 35       mets   Hypertension Son    Hypertension Son     ALLERGIES:  is allergic to protamine, statins, adhesive [tape], imodium [loperamide], and rosuvastatin calcium.  MEDICATIONS:  Current Outpatient Medications  Medication Sig Dispense Refill   acetaminophen (TYLENOL) 500 MG tablet Take 500-1,000 mg by mouth 2 (two) times daily as needed (for pain).      aspirin EC 81 MG tablet Take 81 mg by mouth daily. Swallow whole.     cholecalciferol (VITAMIN D3) 25 MCG (1000 UNIT) tablet Take 1,000 Units by mouth daily.     Evolocumab (REPATHA SURECLICK) 140 MG/ML SOAJ Inject 140 mg into the skin every 14 (fourteen) days. 2 mL 2   ferrous sulfate 325 (65 FE) MG EC tablet Take 1 tablet (325 mg total) by mouth daily with breakfast. 30 tablet 3   furosemide (LASIX) 20 MG tablet Take 1 tablet by mouth once daily 30 tablet 3   glucose blood (ONETOUCH ULTRA) test strip USE 1 STRIP TO  CHECK GLUCOSE ONCE DAILY 100 each 3   isosorbide mononitrate (IMDUR) 30 MG 24 hr tablet Take 1 tablet by mouth once daily 90 tablet 1   nitroGLYCERIN (NITROSTAT) 0.4 MG SL tablet DISSOLVE ONE TABLET UNDER THE TONGUE EVERY 5 MINUTES AS NEEDED FOR CHEST PAIN.  DO NOT EXCEED A TOTAL OF 3 DOSES IN 15 MINUTES 25 tablet 2   rosuvastatin (CRESTOR) 5 MG tablet Take 1 tablet (5 mg total) by mouth daily. 90 tablet 3   valsartan (DIOVAN) 40 MG tablet Take 0.5 tablets (20 mg total) by mouth daily. (Patient taking differently: Take 20 mg by mouth daily. Only takes as needed) 45 tablet 3   vitamin B-12 (CYANOCOBALAMIN) 1000 MCG tablet Take 1,000 mcg by mouth daily.     albuterol (VENTOLIN HFA) 108 (90 Base) MCG/ACT inhaler Inhale 2 puffs into the lungs every 6 (six) hours as needed for wheezing or shortness of breath. (Patient not taking: Reported on 01/06/2023) 18 g 0   No current facility-administered medications for this visit.    REVIEW OF SYSTEMS:   Constitutional: ( - ) fevers, ( - )  chills , ( - ) night sweats Eyes: ( - ) blurriness of vision, ( - ) double vision, ( - ) watery eyes Ears, nose, mouth, throat, and face: ( - ) mucositis, ( - ) sore throat Respiratory: ( - ) cough, ( + ) dyspnea, ( - ) wheezes Cardiovascular: ( - ) palpitation, ( - ) chest discomfort, ( - ) lower extremity swelling Gastrointestinal:  ( - ) nausea, ( - ) heartburn, ( - ) change in bowel habits Skin: ( - ) abnormal skin rashes Lymphatics: ( - ) new lymphadenopathy, ( - ) easy bruising Neurological: ( - ) numbness, ( - ) tingling, ( - ) new weaknesses Behavioral/Psych: ( - ) mood change, ( - ) new changes  All other systems were reviewed with the patient and are negative.  PHYSICAL EXAMINATION: ECOG PERFORMANCE STATUS: 1 - Symptomatic but completely ambulatory  Vitals:   01/06/23 1357  BP: (!) 147/72  Pulse: 78  Resp: 18  Temp: 98.2  F (36.8 C)  SpO2: 98%   Filed Weights   01/06/23 1357  Weight: 178 lb 12.8  oz (81.1 kg)    GENERAL: elderly appearing male in NAD  SKIN: skin color, texture, turgor are normal, no rashes or significant lesions EYES: conjunctiva are pink and non-injected, sclera clear LUNGS: clear to auscultation and percussion with normal breathing effort HEART: regular rate & rhythm and no murmurs and no lower extremity edema Musculoskeletal: no cyanosis of digits and no clubbing  PSYCH: alert & oriented x 3, fluent speech NEURO: no focal motor/sensory deficits  LABORATORY DATA:  I have reviewed the data as listed    Latest Ref Rng & Units 01/06/2023    1:32 PM 11/02/2022    3:56 PM 10/28/2022   11:33 AM  CBC  WBC 4.0 - 10.5 K/uL 6.1  6.5  6.4   Hemoglobin 13.0 - 17.0 g/dL 8.7  9.4  8.9   Hematocrit 39.0 - 52.0 % 27.6  32.0  30.0   Platelets 150 - 400 K/uL 199  244         Latest Ref Rng & Units 11/02/2022    3:56 PM 10/21/2022   12:13 PM 08/22/2022    9:21 AM  CMP  Glucose 70 - 99 mg/dL 161  096  045   BUN 8 - 23 mg/dL 24  20  19    Creatinine 0.61 - 1.24 mg/dL 4.09  8.11  9.14   Sodium 135 - 145 mmol/L 139  138  141   Potassium 3.5 - 5.1 mmol/L 4.2  4.0  4.7   Chloride 98 - 111 mmol/L 107  108  107   CO2 22 - 32 mmol/L 23  22  22    Calcium 8.9 - 10.3 mg/dL 9.0  9.0  9.2   Total Protein 6.5 - 8.1 g/dL 6.3   6.4   Total Bilirubin 0.3 - 1.2 mg/dL 0.5   0.3   Alkaline Phos 38 - 126 U/L 49   68   AST 15 - 41 U/L 20   13   ALT 0 - 44 U/L 14   12     ASSESSMENT & PLAN [name] is a 88 y.o. male who returns for a follow up for iron deficiency anemia.   #iron deficiency anemia 2/2 GI bleeding: --Underwent EGD/colonoscopy on 11/07/2021 with Dr. Myrtie Neither. EGD showed large hiatal hernia with gastric erosions with stigmata of recent bleeding. Colonoscopy did not show any signs of bleeding.  --Last received IV venofer 200 mg x 5 doses from 11/04/2022-12/02/2022. --Stopped PO iron therapy once he began IV iron infusions. Recommend to resume. New prescription sent for  ferrous sulfate 325 mg once daily with a source of vitamin C --encouraged to eat iron rich foods.  --consider blood transfusion if Hgb <8 --Labs today show persistent anemia with Hgb 8.7, MCV 92.6. Iron panel shows mild improvement of iron with serum iron 43, TIBC 396, saturation 11% and ferritin 21. --recommend another round of IV iron to bolster levels. --RTC in 4 weeks for lab check and 8 weeks for labs/follow up.   No orders of the defined types were placed in this encounter.   All questions were answered. The patient knows to call the clinic with any problems, questions or concerns.  I have spent a total of 30 minutes minutes of face-to-face and non-face-to-face time, preparing to see the patient, performing a medically appropriate examination, counseling and educating the patient, ordering medications, documenting clinical information in  the electronic health record,  and care coordination.   Georga Kaufmann, PA-C Department of Hematology/Oncology [facility] Phone: [phone]

## 2023-01-07 ENCOUNTER — Encounter: Payer: Self-pay | Admitting: Physician Assistant

## 2023-01-09 ENCOUNTER — Telehealth: Payer: Self-pay

## 2023-01-09 NOTE — Telephone Encounter (Signed)
Pt advised of lab results and the need for IV iron.  Appt is scheduled for 01/18/23

## 2023-01-09 NOTE — Telephone Encounter (Signed)
-----   Message from Briant Cedar, PA-C sent at 01/07/2023  3:20 PM EDT ----- Please notify patient that iron levels are low so he needs another round of IV iron.

## 2023-01-18 ENCOUNTER — Other Ambulatory Visit: Payer: Self-pay

## 2023-01-18 ENCOUNTER — Inpatient Hospital Stay: Payer: Medicare HMO | Attending: Physician Assistant

## 2023-01-18 VITALS — BP 107/50 | HR 69 | Temp 98.6°F | Resp 18

## 2023-01-18 DIAGNOSIS — D500 Iron deficiency anemia secondary to blood loss (chronic): Secondary | ICD-10-CM | POA: Insufficient documentation

## 2023-01-18 DIAGNOSIS — D509 Iron deficiency anemia, unspecified: Secondary | ICD-10-CM

## 2023-01-18 MED ORDER — SODIUM CHLORIDE 0.9 % IV SOLN: Freq: Once | INTRAVENOUS | Status: AC

## 2023-01-18 MED ORDER — SODIUM CHLORIDE 0.9 % IV SOLN
200.0000 mg | Freq: Once | INTRAVENOUS | Status: AC
  Administered 2023-01-18: 200 mg via INTRAVENOUS
  Filled 2023-01-18: qty 200

## 2023-01-18 NOTE — Patient Instructions (Signed)

## 2023-01-24 ENCOUNTER — Other Ambulatory Visit: Payer: Self-pay

## 2023-01-24 ENCOUNTER — Inpatient Hospital Stay: Payer: Medicare HMO

## 2023-01-24 VITALS — BP 122/67 | HR 75 | Temp 98.0°F | Resp 18

## 2023-01-24 DIAGNOSIS — D500 Iron deficiency anemia secondary to blood loss (chronic): Secondary | ICD-10-CM | POA: Diagnosis not present

## 2023-01-24 DIAGNOSIS — D509 Iron deficiency anemia, unspecified: Secondary | ICD-10-CM

## 2023-01-24 MED ORDER — SODIUM CHLORIDE 0.9 % IV SOLN: Freq: Once | INTRAVENOUS | Status: AC

## 2023-01-24 MED ORDER — SODIUM CHLORIDE 0.9 % IV SOLN
200.0000 mg | Freq: Once | INTRAVENOUS | Status: AC
  Administered 2023-01-24: 200 mg via INTRAVENOUS
  Filled 2023-01-24: qty 200

## 2023-01-30 ENCOUNTER — Inpatient Hospital Stay: Payer: Medicare HMO

## 2023-01-30 ENCOUNTER — Other Ambulatory Visit: Payer: Self-pay

## 2023-01-30 ENCOUNTER — Other Ambulatory Visit: Payer: Medicare HMO

## 2023-01-30 DIAGNOSIS — D500 Iron deficiency anemia secondary to blood loss (chronic): Secondary | ICD-10-CM

## 2023-01-30 LAB — CBC WITH DIFFERENTIAL (CANCER CENTER ONLY)
Abs Immature Granulocytes: 0.01 10*3/uL (ref 0.00–0.07)
Basophils Absolute: 0 10*3/uL (ref 0.0–0.1)
Basophils Relative: 1 %
Eosinophils Absolute: 0.3 10*3/uL (ref 0.0–0.5)
Eosinophils Relative: 6 %
HCT: 29.6 % — ABNORMAL LOW (ref 39.0–52.0)
Hemoglobin: 9.1 g/dL — ABNORMAL LOW (ref 13.0–17.0)
Immature Granulocytes: 0 %
Lymphocytes Relative: 24 %
Lymphs Abs: 1.1 10*3/uL (ref 0.7–4.0)
MCH: 30.5 pg (ref 26.0–34.0)
MCHC: 30.7 g/dL (ref 30.0–36.0)
MCV: 99.3 fL (ref 80.0–100.0)
Monocytes Absolute: 0.4 10*3/uL (ref 0.1–1.0)
Monocytes Relative: 9 %
Neutro Abs: 2.8 10*3/uL (ref 1.7–7.7)
Neutrophils Relative %: 60 %
Platelet Count: 192 10*3/uL (ref 150–400)
RBC: 2.98 MIL/uL — ABNORMAL LOW (ref 4.22–5.81)
RDW: 18.3 % — ABNORMAL HIGH (ref 11.5–15.5)
WBC Count: 4.6 10*3/uL (ref 4.0–10.5)
nRBC: 0 % (ref 0.0–0.2)

## 2023-01-30 LAB — FERRITIN: Ferritin: 60 ng/mL (ref 24–336)

## 2023-01-30 LAB — IRON AND IRON BINDING CAPACITY (CC-WL,HP ONLY)
Iron: 242 ug/dL — ABNORMAL HIGH (ref 45–182)
Saturation Ratios: 61 % — ABNORMAL HIGH (ref 17.9–39.5)
TIBC: 398 ug/dL (ref 250–450)
UIBC: 156 ug/dL (ref 117–376)

## 2023-01-31 ENCOUNTER — Inpatient Hospital Stay: Payer: Medicare HMO

## 2023-01-31 VITALS — BP 163/87 | HR 64 | Temp 97.9°F | Resp 14

## 2023-01-31 DIAGNOSIS — D509 Iron deficiency anemia, unspecified: Secondary | ICD-10-CM

## 2023-01-31 DIAGNOSIS — D500 Iron deficiency anemia secondary to blood loss (chronic): Secondary | ICD-10-CM | POA: Diagnosis not present

## 2023-01-31 MED ORDER — SODIUM CHLORIDE 0.9 % IV SOLN: Freq: Once | INTRAVENOUS | Status: AC

## 2023-01-31 MED ORDER — SODIUM CHLORIDE 0.9 % IV SOLN
200.0000 mg | Freq: Once | INTRAVENOUS | Status: AC
  Administered 2023-01-31: 200 mg via INTRAVENOUS
  Filled 2023-01-31: qty 200

## 2023-01-31 NOTE — Progress Notes (Signed)
Patient declined 30 min post observation period.  Tolerated treatment well without incident.  VSS stable at discharge.  Ambulated to lobby.

## 2023-01-31 NOTE — Patient Instructions (Signed)
Iron Sucrose Injection What is this medication? IRON SUCROSE (EYE ern SOO krose) treats low levels of iron (iron deficiency anemia) in people with kidney disease. Iron is a mineral that plays an important role in making red blood cells, which carry oxygen from your lungs to the rest of your body. This medicine may be used for other purposes; ask your health care provider or pharmacist if you have questions. COMMON BRAND NAME(S): Venofer What should I tell my care team before I take this medication? They need to know if you have any of these conditions: Anemia not caused by low iron levels Heart disease High levels of iron in the blood Kidney disease Liver disease An unusual or allergic reaction to iron, other medications, foods, dyes, or preservatives Pregnant or trying to get pregnant Breastfeeding How should I use this medication? This medication is for infusion into a vein. It is given in a hospital or clinic setting. Talk to your care team about the use of this medication in children. While this medication may be prescribed for children as young as 2 years for selected conditions, precautions do apply. Overdosage: If you think you have taken too much of this medicine contact a poison control center or emergency room at once. NOTE: This medicine is only for you. Do not share this medicine with others. What if I miss a dose? Keep appointments for follow-up doses. It is important not to miss your dose. Call your care team if you are unable to keep an appointment. What may interact with this medication? Do not take this medication with any of the following: Deferoxamine Dimercaprol Other iron products This medication may also interact with the following: Chloramphenicol Deferasirox This list may not describe all possible interactions. Give your health care provider a list of all the medicines, herbs, non-prescription drugs, or dietary supplements you use. Also tell them if you smoke,  drink alcohol, or use illegal drugs. Some items may interact with your medicine. What should I watch for while using this medication? Visit your care team regularly. Tell your care team if your symptoms do not start to get better or if they get worse. You may need blood work done while you are taking this medication. You may need to follow a special diet. Talk to your care team. Foods that contain iron include: whole grains/cereals, dried fruits, beans, or peas, leafy green vegetables, and organ meats (liver, kidney). What side effects may I notice from receiving this medication? Side effects that you should report to your care team as soon as possible: Allergic reactions--skin rash, itching, hives, swelling of the face, lips, tongue, or throat Low blood pressure--dizziness, feeling faint or lightheaded, blurry vision Shortness of breath Side effects that usually do not require medical attention (report to your care team if they continue or are bothersome): Flushing Headache Joint pain Muscle pain Nausea Pain, redness, or irritation at injection site This list may not describe all possible side effects. Call your doctor for medical advice about side effects. You may report side effects to FDA at 1-800-FDA-1088. Where should I keep my medication? This medication is given in a hospital or clinic. It will not be stored at home. NOTE: This sheet is a summary. It may not cover all possible information. If you have questions about this medicine, talk to your doctor, pharmacist, or health care provider.  2024 Elsevier/Gold Standard (2022-12-02 00:00:00)

## 2023-02-06 ENCOUNTER — Telehealth: Payer: Self-pay

## 2023-02-06 NOTE — Telephone Encounter (Signed)
-----   Message from Briant Cedar sent at 02/06/2023  1:48 PM EDT ----- Please notify patient that iron has improved. Recommend to continue with remaining iron infusions as scheduled. ----- Message ----- From: Leory Plowman, Lab In [place] Sent: 01/30/2023  10:45 AM EDT To: Briant Cedar, PA-C

## 2023-02-06 NOTE — Telephone Encounter (Signed)
Wife advised of lab results and recommendations

## 2023-02-07 ENCOUNTER — Other Ambulatory Visit: Payer: Self-pay

## 2023-02-07 ENCOUNTER — Inpatient Hospital Stay: Payer: Medicare HMO

## 2023-02-07 VITALS — BP 119/67 | HR 66 | Temp 98.0°F | Resp 17

## 2023-02-07 DIAGNOSIS — D500 Iron deficiency anemia secondary to blood loss (chronic): Secondary | ICD-10-CM | POA: Diagnosis not present

## 2023-02-07 DIAGNOSIS — D509 Iron deficiency anemia, unspecified: Secondary | ICD-10-CM

## 2023-02-07 MED ORDER — SODIUM CHLORIDE 0.9 % IV SOLN
200.0000 mg | Freq: Once | INTRAVENOUS | Status: AC
  Administered 2023-02-07: 200 mg via INTRAVENOUS
  Filled 2023-02-07: qty 200

## 2023-02-07 MED ORDER — SODIUM CHLORIDE 0.9 % IV SOLN: Freq: Once | INTRAVENOUS | Status: AC

## 2023-02-07 NOTE — Progress Notes (Signed)
Patient declined to stay for 30 minute post iron observation.

## 2023-02-09 ENCOUNTER — Ambulatory Visit: Payer: Medicare HMO | Attending: Nurse Practitioner

## 2023-02-09 ENCOUNTER — Other Ambulatory Visit: Payer: Medicare HMO

## 2023-02-09 DIAGNOSIS — R0609 Other forms of dyspnea: Secondary | ICD-10-CM | POA: Diagnosis not present

## 2023-02-09 DIAGNOSIS — I10 Essential (primary) hypertension: Secondary | ICD-10-CM

## 2023-02-09 DIAGNOSIS — I4811 Longstanding persistent atrial fibrillation: Secondary | ICD-10-CM

## 2023-02-09 DIAGNOSIS — E785 Hyperlipidemia, unspecified: Secondary | ICD-10-CM

## 2023-02-09 DIAGNOSIS — I25118 Atherosclerotic heart disease of native coronary artery with other forms of angina pectoris: Secondary | ICD-10-CM | POA: Diagnosis not present

## 2023-02-09 LAB — LIPID PANEL
Chol/HDL Ratio: 1.3 ratio (ref 0.0–5.0)
Cholesterol, Total: 63 mg/dL — ABNORMAL LOW (ref 100–199)
HDL: 50 mg/dL (ref >39)
Triglycerides: 49 mg/dL (ref 0–149)

## 2023-02-14 ENCOUNTER — Inpatient Hospital Stay: Payer: Medicare HMO | Attending: Physician Assistant

## 2023-02-14 ENCOUNTER — Other Ambulatory Visit: Payer: Self-pay

## 2023-02-14 VITALS — BP 135/76 | HR 70 | Temp 98.3°F | Resp 18

## 2023-02-14 DIAGNOSIS — K922 Gastrointestinal hemorrhage, unspecified: Secondary | ICD-10-CM | POA: Diagnosis not present

## 2023-02-14 DIAGNOSIS — Z87891 Personal history of nicotine dependence: Secondary | ICD-10-CM | POA: Insufficient documentation

## 2023-02-14 DIAGNOSIS — Z800 Family history of malignant neoplasm of digestive organs: Secondary | ICD-10-CM | POA: Insufficient documentation

## 2023-02-14 DIAGNOSIS — D500 Iron deficiency anemia secondary to blood loss (chronic): Secondary | ICD-10-CM | POA: Insufficient documentation

## 2023-02-14 DIAGNOSIS — D509 Iron deficiency anemia, unspecified: Secondary | ICD-10-CM

## 2023-02-14 MED ORDER — SODIUM CHLORIDE 0.9 % IV SOLN: Freq: Once | INTRAVENOUS | Status: AC

## 2023-02-14 MED ORDER — SODIUM CHLORIDE 0.9 % IV SOLN
200.0000 mg | Freq: Once | INTRAVENOUS | Status: AC
  Administered 2023-02-14: 200 mg via INTRAVENOUS
  Filled 2023-02-14: qty 200

## 2023-02-14 NOTE — Progress Notes (Signed)
Pt declined to stay for 30 minute observation period post Venofer infustion. VSS. No complaints at time of discharge.

## 2023-02-14 NOTE — Patient Instructions (Signed)
Iron Sucrose Injection What is this medication? IRON SUCROSE (EYE ern SOO krose) treats low levels of iron (iron deficiency anemia) in people with kidney disease. Iron is a mineral that plays an important role in making red blood cells, which carry oxygen from your lungs to the rest of your body. This medicine may be used for other purposes; ask your health care provider or pharmacist if you have questions. COMMON BRAND NAME(S): Venofer What should I tell my care team before I take this medication? They need to know if you have any of these conditions: Anemia not caused by low iron levels Heart disease High levels of iron in the blood Kidney disease Liver disease An unusual or allergic reaction to iron, other medications, foods, dyes, or preservatives Pregnant or trying to get pregnant Breastfeeding How should I use this medication? This medication is for infusion into a vein. It is given in a hospital or clinic setting. Talk to your care team about the use of this medication in children. While this medication may be prescribed for children as young as 2 years for selected conditions, precautions do apply. Overdosage: If you think you have taken too much of this medicine contact a poison control center or emergency room at once. NOTE: This medicine is only for you. Do not share this medicine with others. What if I miss a dose? Keep appointments for follow-up doses. It is important not to miss your dose. Call your care team if you are unable to keep an appointment. What may interact with this medication? Do not take this medication with any of the following: Deferoxamine Dimercaprol Other iron products This medication may also interact with the following: Chloramphenicol Deferasirox This list may not describe all possible interactions. Give your health care provider a list of all the medicines, herbs, non-prescription drugs, or dietary supplements you use. Also tell them if you smoke,  drink alcohol, or use illegal drugs. Some items may interact with your medicine. What should I watch for while using this medication? Visit your care team regularly. Tell your care team if your symptoms do not start to get better or if they get worse. You may need blood work done while you are taking this medication. You may need to follow a special diet. Talk to your care team. Foods that contain iron include: whole grains/cereals, dried fruits, beans, or peas, leafy green vegetables, and organ meats (liver, kidney). What side effects may I notice from receiving this medication? Side effects that you should report to your care team as soon as possible: Allergic reactions--skin rash, itching, hives, swelling of the face, lips, tongue, or throat Low blood pressure--dizziness, feeling faint or lightheaded, blurry vision Shortness of breath Side effects that usually do not require medical attention (report to your care team if they continue or are bothersome): Flushing Headache Joint pain Muscle pain Nausea Pain, redness, or irritation at injection site This list may not describe all possible side effects. Call your doctor for medical advice about side effects. You may report side effects to FDA at 1-800-FDA-1088. Where should I keep my medication? This medication is given in a hospital or clinic. It will not be stored at home. NOTE: This sheet is a summary. It may not cover all possible information. If you have questions about this medicine, talk to your doctor, pharmacist, or health care provider.  2024 Elsevier/Gold Standard (2022-12-02 00:00:00)

## 2023-02-27 ENCOUNTER — Other Ambulatory Visit: Payer: Self-pay | Admitting: Physician Assistant

## 2023-02-27 DIAGNOSIS — D509 Iron deficiency anemia, unspecified: Secondary | ICD-10-CM

## 2023-02-28 ENCOUNTER — Inpatient Hospital Stay: Payer: Medicare HMO

## 2023-02-28 ENCOUNTER — Inpatient Hospital Stay: Payer: Medicare HMO | Admitting: Physician Assistant

## 2023-02-28 VITALS — BP 155/74 | HR 69 | Temp 97.8°F | Resp 18 | Wt 178.7 lb

## 2023-02-28 DIAGNOSIS — K922 Gastrointestinal hemorrhage, unspecified: Secondary | ICD-10-CM | POA: Diagnosis not present

## 2023-02-28 DIAGNOSIS — D500 Iron deficiency anemia secondary to blood loss (chronic): Secondary | ICD-10-CM | POA: Diagnosis not present

## 2023-02-28 DIAGNOSIS — Z800 Family history of malignant neoplasm of digestive organs: Secondary | ICD-10-CM | POA: Diagnosis not present

## 2023-02-28 DIAGNOSIS — Z87891 Personal history of nicotine dependence: Secondary | ICD-10-CM | POA: Diagnosis not present

## 2023-02-28 LAB — CBC WITH DIFFERENTIAL (CANCER CENTER ONLY)
Abs Immature Granulocytes: 0.02 10*3/uL (ref 0.00–0.07)
Basophils Absolute: 0 10*3/uL (ref 0.0–0.1)
Basophils Relative: 1 %
Eosinophils Absolute: 0.2 10*3/uL (ref 0.0–0.5)
Eosinophils Relative: 5 %
HCT: 34.4 % — ABNORMAL LOW (ref 39.0–52.0)
Hemoglobin: 11.3 g/dL — ABNORMAL LOW (ref 13.0–17.0)
Immature Granulocytes: 0 %
Lymphocytes Relative: 26 %
Lymphs Abs: 1.3 10*3/uL (ref 0.7–4.0)
MCH: 31.5 pg (ref 26.0–34.0)
MCHC: 32.8 g/dL (ref 30.0–36.0)
MCV: 95.8 fL (ref 80.0–100.0)
Monocytes Absolute: 0.5 10*3/uL (ref 0.1–1.0)
Monocytes Relative: 10 %
Neutro Abs: 2.9 10*3/uL (ref 1.7–7.7)
Neutrophils Relative %: 58 %
Platelet Count: 192 10*3/uL (ref 150–400)
RBC: 3.59 MIL/uL — ABNORMAL LOW (ref 4.22–5.81)
RDW: 13.8 % (ref 11.5–15.5)
WBC Count: 4.9 10*3/uL (ref 4.0–10.5)
nRBC: 0 % (ref 0.0–0.2)

## 2023-02-28 LAB — IRON AND IRON BINDING CAPACITY (CC-WL,HP ONLY)
Iron: 99 ug/dL (ref 45–182)
Saturation Ratios: 29 % (ref 17.9–39.5)
TIBC: 340 ug/dL (ref 250–450)
UIBC: 241 ug/dL (ref 117–376)

## 2023-02-28 LAB — FERRITIN: Ferritin: 66 ng/mL (ref 24–336)

## 2023-02-28 NOTE — Progress Notes (Unsigned)
[facility] [phone]   [phone]  PROGRESS NOTE  Patient Care Team: Excell Seltzer, MD as PCP - General (Family Medicine) Kathleene Hazel, MD as PCP - Cardiology (Cardiology) Jethro Bolus, MD as Consulting Physician (Ophthalmology) Vilinda Flake, [facility] (Inactive) as Pharmacist (Pharmacist)   CHIEF COMPLAINTS/PURPOSE OF CONSULTATION:  Iron deficiency anemia 2/2 GI bleeding  PRIOR TREATMENT: 11/04/2022-12/02/2022: Received IV venofer 200 mg x 5 doses 01/18/2023-02/14/2023: Received IV venofer 200 mg x 5 doses  INTERVAL HISTORY:  [name] 88 y.o. male returns for a follow up for iron deficiency anemia. He was last seen on 6/28//2024. In the interim, he has received another round of IV venofer x 5 doses. He is accompanied by his wife for this visit.   On exam today, [name] reports his energy levels continue to improve. He is able to do his normal routines on his own. He denies nausea, vomiting or bowel habit changes. He is taking his iron pills once a day as prescibed with some constipation noted. He is planning to start stool softeners.He reports his stools are dark, black due to the iron pills but no other signs of bleeding. He reports stable shortness of breath with exertion. He denies fevers, chills, sweats, chest pain or cough. He has no other complaints. Rest of the 10 point ROS is below.   MEDICAL HISTORY:  Past Medical History:  Diagnosis Date   CAD (coronary artery disease)    s/p stent mid LAD 2005 // s/p CABG 2016 // Myoview 8/19:  EF 64, no ischemia   Coronary artery disease involving native coronary artery of native heart without angina pectoris    Depression    DJD (degenerative joint disease)    DM type 2 with diabetic peripheral neuropathy (HCC) 03/10/2017   E coli bacteremia 04/20/2016   Essential hypertension 04/20/2016   GERD (gastroesophageal reflux disease)    History of echocardiogram    Echo 8/19: Moderate  LVH, EF 60-65, normal wall motion, grade 1 diastolic dysfunction, mild MR, mild LAE, normal RVSF, mild TR, PASP 25   History of hiatal hernia    HTN (hypertension)    Hyperlipidemia    Neuropathy due to type 2 diabetes mellitus (HCC) 03/10/2017   PERCUTANEOUS TRANSLUMINAL CORONARY ANGIOPLASTY, HX OF 11/26/2007   Annotation: With CYPHER STENT. Qualifier: Diagnosis of  By: Genelle Gather CMA, [place]     Pre-diabetes    S/P CABG x 4 11/03/2014   S/P total knee replacement 04/11/2016   Sepsis due to Escherichia coli (E. coli) (HCC) 04/20/2016   Tinea pedis 09/08/2017    SURGICAL HISTORY: Past Surgical History:  Procedure Laterality Date   BRAIN SURGERY     CARDIAC CATHETERIZATION     COLONOSCOPY     CORONARY ARTERY BYPASS GRAFT N/A 11/03/2014   Procedure: CORONARY ARTERY BYPASS GRAFTING (CABG), ON PUMP, TIMES FOUR, USING LEFT INTERNAL MAMMARY, RIGHT GREATER SAPHENOUS VEIN HARVESTED ENDOSCOPICALLY;  Surgeon: Kerin Perna, MD;  Location: [facility] OR;  Service: Open Heart Surgery;  Laterality: N/A;  LIMA-LAD; SVG-DIAG; SVG-OM; SVG-RCA   CRANIOTOMY N/A 01/18/2014   Procedure: CRANIOTOMY HEMATOMA EVACUATION SUBDURAL;  Surgeon: Hewitt Shorts, MD;  Location: [facility] OR;  Service: Neurosurgery;  Laterality: N/A;   kidney stone removal     KNEE SURGERY     LEFT HEART CATHETERIZATION WITH CORONARY ANGIOGRAM N/A 07/27/2011   Procedure: LEFT HEART CATHETERIZATION WITH CORONARY ANGIOGRAM;  Surgeon: Kathleene Hazel, MD;  Location: [facility] CATH LAB;  Service: Cardiovascular;  Laterality: N/A;   LEFT HEART CATHETERIZATION WITH CORONARY ANGIOGRAM N/A 10/31/2014   Procedure: LEFT HEART CATHETERIZATION WITH CORONARY ANGIOGRAM;  Surgeon: Tonny Bollman, MD;  Location: [facility] C[facility] LAB;  Service: Cardiovascular;  Laterality: N/A;   PTCA     Hx of it.    SIGMOIDOSCOPY     TEE WITHOUT CARDIOVERSION N/A 11/03/2014   Procedure: TRANSESOPHAGEAL ECHOCARDIOGRAM (TEE);  Surgeon: Kerin Perna, MD;  Location: [facility] OR;  Service: Open  Heart Surgery;  Laterality: N/A;   TOTAL KNEE ARTHROPLASTY  02/09/2011   Right knee   TOTAL KNEE ARTHROPLASTY Left 04/11/2016   Procedure: LEFT TOTAL KNEE ARTHROPLASTY;  Surgeon: Dannielle Huh, MD;  Location: [facility] OR;  Service: Orthopedics;  Laterality: Left;   UPPER GASTROINTESTINAL ENDOSCOPY      SOCIAL HISTORY: Social History   Socioeconomic History   Marital status: Married    Spouse name: Not on file   Number of children: 3   Years of education: Not on file   Highest education level: Not on file  Occupational History   Occupation: retired  Tobacco Use   Smoking status: Former    Current packs/day: 0.00    Types: Cigarettes    Quit date: 1985    Years since quitting: 39.6   Smokeless tobacco: Former    Types: Chew    Quit date: 1985   Tobacco comments:    quit in 1985. smoked 1ppd for 35 years   Vaping Use   Vaping status: Never Used  Substance and Sexual Activity   Alcohol use: No   Drug use: No   Sexual activity: Never  Other Topics Concern   Not on file  Social History Narrative   Married with children. 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 10:20 am    Has living will,  HCPOA: jerrie Naab, full code (reviewed 62)   Social Determinants of Health   Financial Resource Strain: Low Risk  (06/22/2021)   Overall Financial Resource Strain (CARDIA)    Difficulty of Paying Living Expenses: Not hard at all  Food Insecurity: No Food Insecurity (11/02/2022)   Hunger Vital Sign    Worried About Running Out of Food in the Last Year: Never true    Ran Out of Food in the Last Year: Never true  Transportation Needs: No Transportation Needs (11/02/2022)   PRAPARE - Administrator, Civil Service (Medical): No    Lack of Transportation (Non-Medical): No  Physical Activity: Inactive (06/22/2021)   Exercise Vital Sign    Days of Exercise per Week: 0 days    Minutes of Exercise per Session: 0 min  Stress: No Stress Concern  Present (06/22/2021)   [employer] of Occupational Health - Occupational Stress Questionnaire    Feeling of Stress : Not at all  Social Connections: Socially Integrated (06/22/2021)   Social Connection and Isolation Panel [NHANES]    Frequency of Communication with Friends and Family: More than three times a week    Frequency of Social Gatherings with Friends and Family: Three times a week    Attends Religious Services: More than 4 times per year    Active Member of Clubs or Organizations: Yes    Attends Banker Meetings: More than 4 times per year    Marital Status: Married  Catering manager Violence: Not At Risk (11/02/2022)   Humiliation, Afraid, Rape, and Kick questionnaire    Fear of Current or Ex-Partner: No  Emotionally Abused: No    Physically Abused: No    Sexually Abused: No    FAMILY HISTORY: Family History  Problem Relation Age of Onset   Heart failure Mother    COPD Mother    Heart disease Father    Heart attack Father    Heart disease Sister    Heart attack Sister    Heart attack Brother    Colon cancer Daughter 44       mets   Hypertension Son    Hypertension Son     ALLERGIES:  is allergic to protamine, statins, adhesive [tape], imodium [loperamide], and rosuvastatin calcium.  MEDICATIONS:  Current Outpatient Medications  Medication Sig Dispense Refill   acetaminophen (TYLENOL) 500 MG tablet Take 500-1,000 mg by mouth 2 (two) times daily as needed (for pain).      aspirin EC 81 MG tablet Take 81 mg by mouth daily. Swallow whole.     cholecalciferol (VITAMIN D3) 25 MCG (1000 UNIT) tablet Take 1,000 Units by mouth daily.     ferrous sulfate 325 (65 FE) MG EC tablet Take 1 tablet (325 mg total) by mouth daily with breakfast. 30 tablet 3   furosemide (LASIX) 20 MG tablet Take 1 tablet by mouth once daily 30 tablet 3   glucose blood (ONETOUCH ULTRA) test strip USE 1 STRIP TO CHECK GLUCOSE ONCE DAILY 100 each 3   isosorbide mononitrate  (IMDUR) 30 MG 24 hr tablet Take 1 tablet by mouth once daily 90 tablet 1   nitroGLYCERIN (NITROSTAT) 0.4 MG SL tablet DISSOLVE ONE TABLET UNDER THE TONGUE EVERY 5 MINUTES AS NEEDED FOR CHEST PAIN.  DO NOT EXCEED A TOTAL OF 3 DOSES IN 15 MINUTES 25 tablet 2   rosuvastatin (CRESTOR) 5 MG tablet Take 1 tablet (5 mg total) by mouth daily. 90 tablet 3   valsartan (DIOVAN) 40 MG tablet Take 0.5 tablets (20 mg total) by mouth daily. (Patient taking differently: Take 20 mg by mouth daily. Only takes as needed) 45 tablet 3   vitamin B-12 (CYANOCOBALAMIN) 1000 MCG tablet Take 1,000 mcg by mouth daily.     No current facility-administered medications for this visit.    REVIEW OF SYSTEMS:   Constitutional: ( - ) fevers, ( - )  chills , ( - ) night sweats Eyes: ( - ) blurriness of vision, ( - ) double vision, ( - ) watery eyes Ears, nose, mouth, throat, and face: ( - ) mucositis, ( - ) sore throat Respiratory: ( - ) cough, ( + ) dyspnea, ( - ) wheezes Cardiovascular: ( - ) palpitation, ( - ) chest discomfort, ( - ) lower extremity swelling Gastrointestinal:  ( - ) nausea, ( - ) heartburn, ( - ) change in bowel habits Skin: ( - ) abnormal skin rashes Lymphatics: ( - ) new lymphadenopathy, ( - ) easy bruising Neurological: ( - ) numbness, ( - ) tingling, ( - ) new weaknesses Behavioral/Psych: ( - ) mood change, ( - ) new changes  All other systems were reviewed with the patient and are negative.  PHYSICAL EXAMINATION: ECOG PERFORMANCE STATUS: 1 - Symptomatic but completely ambulatory  Vitals:   02/28/23 1118  BP: (!) 155/74  Pulse: 69  Resp: 18  Temp: 97.8 F (36.6 C)  SpO2: 97%   Filed Weights   02/28/23 1118  Weight: 178 lb 11.2 oz (81.1 kg)    GENERAL: elderly appearing male in NAD  SKIN: skin color, texture, turgor are normal, no  rashes or significant lesions EYES: conjunctiva are pink and non-injected, sclera clear LUNGS: clear to auscultation and percussion with normal breathing  effort HEART: regular rate & rhythm and no murmurs and no lower extremity edema Musculoskeletal: no cyanosis of digits and no clubbing  PSYCH: alert & oriented x 3, fluent speech NEURO: no focal motor/sensory deficits  LABORATORY DATA:  I have reviewed the data as listed    Latest Ref Rng & Units 02/28/2023   10:53 AM 01/30/2023   10:30 AM 01/06/2023    1:32 PM  CBC  WBC 4.0 - 10.5 K/uL 4.9  4.6  6.1   Hemoglobin 13.0 - 17.0 g/dL 16.1  9.1  8.7   Hematocrit 39.0 - 52.0 % 34.4  29.6  27.6   Platelets 150 - 400 K/uL 192  192  199        Latest Ref Rng & Units 11/02/2022    3:56 PM 10/21/2022   12:13 PM 08/22/2022    9:21 AM  CMP  Glucose 70 - 99 mg/dL 096  045  409   BUN 8 - 23 mg/dL 24  20  19    Creatinine 0.61 - 1.24 mg/dL 8.11  9.14  7.82   Sodium 135 - 145 mmol/L 139  138  141   Potassium 3.5 - 5.1 mmol/L 4.2  4.0  4.7   Chloride 98 - 111 mmol/L 107  108  107   CO2 22 - 32 mmol/L 23  22  22    Calcium 8.9 - 10.3 mg/dL 9.0  9.0  9.2   Total Protein 6.5 - 8.1 g/dL 6.3   6.4   Total Bilirubin 0.3 - 1.2 mg/dL 0.5   0.3   Alkaline Phos 38 - 126 U/L 49   68   AST 15 - 41 U/L 20   13   ALT 0 - 44 U/L 14   12     ASSESSMENT & PLAN [name] is a 88 y.o. male who returns for a follow up for iron deficiency anemia.   #iron deficiency anemia 2/2 GI bleeding: --Underwent EGD/colonoscopy on 11/07/2021 with Dr. Myrtie Neither. EGD showed large hiatal hernia with gastric erosions with stigmata of recent bleeding. Colonoscopy did not show any signs of bleeding.  --Last received IV venofer 200 mg x 5 doses from 01/18/2023-02/14/2023 --Labs today shows improvement of anemia with Hgb 11.3, MCV 95.8. Iron panel shows normal levels with serum ion 99, TIBC 340, saturation 29%, ferritin 66. --No need for additional IV iron at this time.  --Recommend to continue on ferrous sulfate 325 mg once daily with a source of vitamin C --encouraged to eat iron rich foods.  --RTC in 6 weeks for lab check and 12  weeks for labs/follow up.   Orders Placed This Encounter  Procedures   CBC with Differential (Cancer Center Only)    Standing Status:   Standing    Number of Occurrences:   2    Standing Expiration Date:   02/29/2024   Iron and Iron Binding Capacity ([facility] only)    Standing Status:   Standing    Number of Occurrences:   2    Standing Expiration Date:   02/29/2024   Ferritin    Standing Status:   Standing    Number of Occurrences:   2    Standing Expiration Date:   02/29/2024    All questions were answered. The patient knows to call the clinic with any problems, questions or concerns.  I  have spent a total of 30 minutes minutes of face-to-face and non-face-to-face time, preparing to see the patient, performing a medically appropriate examination, counseling and educating the patient, ordering medications, documenting clinical information in the electronic health record,  and care coordination.   Georga Kaufmann, PA-C Department of Hematology/Oncology [facility] Phone: [phone]

## 2023-03-01 ENCOUNTER — Encounter: Payer: Self-pay | Admitting: Physician Assistant

## 2023-03-01 ENCOUNTER — Telehealth: Payer: Self-pay

## 2023-03-01 NOTE — Telephone Encounter (Signed)
Pt's wife advised and voiced unbderstanding

## 2023-03-01 NOTE — Telephone Encounter (Signed)
-----   Message from Briant Cedar sent at 03/01/2023  7:07 AM EDT ----- Please notify patient that iron levels are normal so additional IV iron is not needed at this time.

## 2023-03-08 NOTE — Progress Notes (Unsigned)
Cardiology Office Note:    Date:  03/09/2023   ID:  [name], DOB April 14, 1935, MRN [id number]  PCP:  Excell Seltzer, MD   [facility] Providers Cardiologist:  Verne Carrow, MD     Referring MD: Excell Seltzer, MD   Chief Complaint: shortness of breath and fatigue  History of Present Illness:    [name] is a very pleasant 88 y.o. male with a hx of CAD s/p CABG 2016, coronary stent placement prior to CABG, HTN, HLD, PAF, and GERD  He has been followed for CAD since 2005 when he had stent to LAD. He was evaluated April 2016 for chest pain. Cardiac catheterization April 2016 with multivessel disease with severe 95% distal left main stenosis.  He underwent 4V CABG (LIMA-LAD, SVG-DX, SVG-PDA, SVG-OM) April 2016.  Echo April 2017 with normal LV function and no significant valvular disease.  Seen in July 2019 for chest pain, palpitations, and uncontrolled blood pressure.  Nuclear stress test July 2019 with no ischemia.  Echo July 2019 with LVEF 60 to 65% with grade 1 diastolic dysfunction, mild MR.  Cardiac monitor July 2019 with type II second-degree AV block and junctional bradycardia with frequent PACs. His beta-blocker was stopped. He felt better after stopping his beta-blocker.  He had trouble speaking in December 2020 and EMS strips showed sinus with PACs.  EKG in ED was sinus. Echo February 2020 with LVEF 60 to 65%, mild LVH, no significant valve disease.  Cardiac monitor March 2020 with sinus, Mobitz 1 block, no atrial fib.  He was admitted to [facility] June 2022 with COVID-pneumonia.  He had atrial fib while admitted.  Plavix was stopped and Eliquis was started.  Echo June 2022 with LVEF 70 to 75%, moderate LVH, normal RV function, trivial MR, trivial AI.  Seen in cardiology clinic visit 09/21/2021 by Dr. Clifton Damyan. He reported snoring and daytime somnolence but did not want to schedule a sleep study. He was in a fib with well controlled ventricular rate.  He contacted our  office on 08/15/22 to report shortness of breath when moving around for approximately one month along with slight chest pain.  Reported BP was labile.  Also having dizziness, headache, and blurred vision.  Was scheduled for office visit. Reported to triage nurse that he had stopped taking his BP meds and Lasix. She advised him to restart Lasix and ER precautions were advised.   Seen by me on 08/22/22 for evaluation of symptoms as stated above and is accompanied by his wife.Reports he has been less active this winter and initially thought symptoms were 2/2 deconditioning, but feels that symptoms are more significant than just from being less active. Frequently walks 150 feet to his son's house next door and has to stop to rest when he gets there. Also has discomfort in his chest that he feels is stable. Chest discomfort does not worsen with shortness of breath. Took sl ntg x 1 last week with relief. Thought that lower BP may be contributing to symptoms, so he stopped lisinopril and amlodipine. No orthopnea, PND, edema. O2 sats consistently 94-100. No palpitations or feelings of his heart racing. Occasional lighteadedness when he stands too quickly. Admits to not drinking enough water - drinks tea, juice, milk, maybe 1-2 water bottles daily. Does not like taking Eliquis, feels that it has caused some of his symptoms. No bleeding concerns. He was advised to start valsartan 20 mg daily to maintain good BP control. Concern for fatigue,  worsening DOE symptoms of unstable angina. Cardiac tests ordered and lab work completed. Labs revealed drop in hct and hgb and I recommended that he follow-up with PCP. Hgb went from 13.4 to 8.2.   He was found to have microcytic anemia with iron level of 17 and iron saturation of 3.2%. He was started on po iron.   Cardiac PET CT performed 09/14/2022 with no ischemia. Echo 09/20/2022 with LVEF 65 to 70%, moderate LVH, severe left and right atrial enlargement, trivial MR, mild TR.   Seen  in follow-up by Dr. Clifton Karsten on 10/20/2022 for ongoing dyspnea and fatigue.  His wife felt that he looked pale. He continued to refuse statin for LDL 94, considered referral to Lipid clinic but he has continued to be resistant. Did not wish to pursue sleep study. Concern that symptoms are 2/2 Eliquis. Stroke risk was reviewed and he was going to determine if he wanted to accept that risk and stop OAC.  CBC was obtained at office visit and Hgb was 6.0.  He was advised to go to the ED by Dr. Clifton Geoffery.  Seen by me 11/30/22, accompanied by his wife. Feeling much better and says he finally feels like he can resume normal activities. Was placed on IV iron infusion and is followed by GI. Found to have hiatal hernia which may have small amount of bleeding at times. He felt this was all exacerbated by Eliquis and has stopped taking it. Lengthy discussion about antiplatelet and anticoagulant differences and potential bleeding risks. Stroke risk off OAC with AF reviewed with patient and wife and all questions answered. Occasional chest discomfort that his wife thinks is reflux/hiatal hernia. Had no improvement with ntg and pain has not worsened, does not occur with exertion.  He denied shortness of breath, palpitations, orthopnea, PND, presyncope, syncope, edema. No bleeding concerns at present. LDL was not at goal. He had stopped rosuvastatin due to fatigue but agreed to resume at low dose. He was advised to start Repatha in addition to rosuvastatin 5 mg daily.   Today, he is here with his son [name]. Reports he is feeling very good.  He reports he was told by someone to discontinue Repatha, he thinks maybe when he was seen by oncology PA.  He continues oral iron supplementation but is no longer on iron infusions.  He continues to bleed easily if he scratches himself, but otherwise no obvious signs of bleeding.  He monitors his BP very carefully at home and reports highest recent reading was 144 mmHg systolic which improved  on recheck.  He denies chest pain.  Gets "winded" when he has to walk a far distance but is able to do his regular activities at home without significant dyspnea.  He denies edema, orthopnea, PND, palpitations, presyncope, syncope. We had a lengthy discussion about the difference between [facility] and Plavix.   Past Medical History:  Diagnosis Date   CAD (coronary artery disease)    s/p stent mid LAD 2005 // s/p CABG 2016 // Myoview 8/19:  EF 64, no ischemia   Coronary artery disease involving native coronary artery of native heart without angina pectoris    Depression    DJD (degenerative joint disease)    DM type 2 with diabetic peripheral neuropathy (HCC) 03/10/2017   E coli bacteremia 04/20/2016   Essential hypertension 04/20/2016   GERD (gastroesophageal reflux disease)    History of echocardiogram    Echo 8/19: Moderate LVH, EF 60-65, normal wall motion, grade 1 diastolic dysfunction,  mild MR, mild LAE, normal RVSF, mild TR, PASP 25   History of hiatal hernia    HTN (hypertension)    Hyperlipidemia    Neuropathy due to type 2 diabetes mellitus (HCC) 03/10/2017   PERCUTANEOUS TRANSLUMINAL CORONARY ANGIOPLASTY, HX OF 11/26/2007   Annotation: With CYPHER STENT. Qualifier: Diagnosis of  By: Genelle Gather CMA, [place]     Pre-diabetes    S/P CABG x 4 11/03/2014   S/P total knee replacement 04/11/2016   Sepsis due to Escherichia coli (E. coli) (HCC) 04/20/2016   Tinea pedis 09/08/2017    Past Surgical History:  Procedure Laterality Date   BRAIN SURGERY     CARDIAC CATHETERIZATION     COLONOSCOPY     CORONARY ARTERY BYPASS GRAFT N/A 11/03/2014   Procedure: CORONARY ARTERY BYPASS GRAFTING (CABG), ON PUMP, TIMES FOUR, USING LEFT INTERNAL MAMMARY, RIGHT GREATER SAPHENOUS VEIN HARVESTED ENDOSCOPICALLY;  Surgeon: Kerin Perna, MD;  Location: [facility] OR;  Service: Open Heart Surgery;  Laterality: N/A;  LIMA-LAD; SVG-DIAG; SVG-OM; SVG-RCA   CRANIOTOMY N/A 01/18/2014   Procedure: CRANIOTOMY HEMATOMA EVACUATION  SUBDURAL;  Surgeon: Hewitt Shorts, MD;  Location[facility] OR;  Service: Neurosurgery;  Laterality: N/A;   kidney stone removal     KNEE SURGERY     LEFT HEART CATHETERIZATION WITH CORONARY ANGIOGRAM N/A 07/27/2011   Procedure: LEFT HEART CATHETERIZATION WITH CORONARY ANGIOGRAM;  Surgeon: Kathleene Hazel, MD;  Location: [facility] CATH LAB;  Service: Cardiovascular;  Laterality: N/A;   LEFT HEART CATHETERIZATION WITH CORONARY ANGIOGRAM N/A 10/31/2014   Procedure: LEFT HEART CATHETERIZATION WITH CORONARY ANGIOGRAM;  Surgeon: Tonny Bollman, MD;  Location: [facility] CATH LAB;  Service: Cardiovascular;  Laterality: N/A;   PTCA     Hx of it.    SIGMOIDOSCOPY     TEE WITHOUT CARDIOVERSION N/A 11/03/2014   Procedure: TRANSESOPHAGEAL ECHOCARDIOGRAM (TEE);  Surgeon: Kerin Perna, MD;  Location: [facility] OR;  Service: Open Heart Surgery;  Laterality: N/A;   TOTAL KNEE ARTHROPLASTY  02/09/2011   Right knee   TOTAL KNEE ARTHROPLASTY Left 04/11/2016   Procedure: LEFT TOTAL KNEE ARTHROPLASTY;  Surgeon: Dannielle Huh, MD;  Location: [facility] OR;  Service: Orthopedics;  Laterality: Left;   UPPER GASTROINTESTINAL ENDOSCOPY      Current Medications: Current Meds  Medication Sig   acetaminophen (TYLENOL) 500 MG tablet Take 500-1,000 mg by mouth 2 (two) times daily as needed (for pain).    aspirin EC 81 MG tablet Take 81 mg by mouth daily. Swallow whole.   cholecalciferol (VITAMIN D3) 25 MCG (1000 UNIT) tablet Take 1,000 Units by mouth daily.   ferrous sulfate 325 (65 FE) MG EC tablet Take 1 tablet (325 mg total) by mouth daily with breakfast.   furosemide (LASIX) 20 MG tablet Take 1 tablet by mouth once daily   glucose blood (ONETOUCH ULTRA) test strip USE 1 STRIP TO CHECK GLUCOSE ONCE DAILY   isosorbide mononitrate (IMDUR) 30 MG 24 hr tablet Take 1 tablet by mouth once daily   nitroGLYCERIN (NITROSTAT) 0.4 MG SL tablet DISSOLVE ONE TABLET UNDER THE TONGUE EVERY 5 MINUTES AS NEEDED FOR CHEST PAIN.  DO NOT EXCEED A TOTAL OF 3  DOSES IN 15 MINUTES   [START ON 03/10/2023] rosuvastatin (CRESTOR) 5 MG tablet Take 1 tablet (5 mg total) by mouth every Monday, Wednesday, and Friday.   valsartan (DIOVAN) 40 MG tablet Take 0.5 tablets (20 mg total) by mouth daily. (Patient taking differently: Take 20 mg by mouth daily. Only takes as needed)  vitamin B-12 (CYANOCOBALAMIN) 1000 MCG tablet Take 1,000 mcg by mouth daily.     Allergies:   Protamine, Statins, Adhesive [tape], Imodium [loperamide], and Rosuvastatin calcium   Social History   Socioeconomic History   Marital status: Married    Spouse name: Not on file   Number of children: 3   Years of education: Not on file   Highest education level: Not on file  Occupational History   Occupation: retired  Tobacco Use   Smoking status: Former    Current packs/day: 0.00    Types: Cigarettes    Quit date: 1985    Years since quitting: 39.6   Smokeless tobacco: Former    Types: Chew    Quit date: 1985   Tobacco comments:    quit in 1985. smoked 1ppd for 35 years   Vaping Use   Vaping status: Never Used  Substance and Sexual Activity   Alcohol use: No   Drug use: No   Sexual activity: Never  Other Topics Concern   Not on file  Social History Narrative   Married with children. 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 10:20 am    Has living will,  HCPOA: jerrie Goucher, full code (reviewed 62)   Social Determinants of Health   Financial Resource Strain: Low Risk  (06/22/2021)   Overall Financial Resource Strain (CARDIA)    Difficulty of Paying Living Expenses: Not hard at all  Food Insecurity: No Food Insecurity (11/02/2022)   Hunger Vital Sign    Worried About Running Out of Food in the Last Year: Never true    Ran Out of Food in the Last Year: Never true  Transportation Needs: No Transportation Needs (11/02/2022)   PRAPARE - Administrator, Civil Service (Medical): No    Lack of Transportation (Non-Medical):  No  Physical Activity: Inactive (06/22/2021)   Exercise Vital Sign    Days of Exercise per Week: 0 days    Minutes of Exercise per Session: 0 min  Stress: No Stress Concern Present (06/22/2021)   [employer] of Occupational Health - Occupational Stress Questionnaire    Feeling of Stress : Not at all  Social Connections: Socially Integrated (06/22/2021)   Social Connection and Isolation Panel [NHANES]    Frequency of Communication with Friends and Family: More than three times a week    Frequency of Social Gatherings with Friends and Family: Three times a week    Attends Religious Services: More than 4 times per year    Active Member of Clubs or Organizations: Yes    Attends Engineer, structural: More than 4 times per year    Marital Status: Married     Family History: The patient's family history includes COPD in his mother; Colon cancer (age of onset: 68) in his daughter; Heart attack in his brother, father, and sister; Heart disease in his father and sister; Heart failure in his mother; Hypertension in his son and son.  ROS:   Please see the history of present illness.   + DOE All other systems reviewed and are negative.   Labs/Other Studies Reviewed:    The following studies were reviewed today:  Echo 09/20/22 1. Mild intracavitary gradient. Peak velocity 0.9 m/s. Peak gradient 3.2  mmHg. Left ventricular ejection fraction, by estimation, is 65 to 70%. The  left ventricle has normal function. The left ventricle has no regional  wall motion abnormalities. There  is moderate concentric left  ventricular hypertrophy. Left ventricular  diastolic function could not be evaluated.   2. Right ventricular systolic function is normal. The right ventricular  size is mildly enlarged. There is mildly elevated pulmonary artery  systolic pressure.   3. Left atrial size was severely dilated.   4. Right atrial size was severely dilated.   5. The mitral valve is normal in  structure. Trivial mitral valve  regurgitation. No evidence of mitral stenosis.   6. Tricuspid valve regurgitation is mild to moderate.   7. The aortic valve is tricuspid. Aortic valve regurgitation is not  visualized. No aortic stenosis is present.   8. Aortic dilatation noted. There is mild dilatation of the aortic root,  measuring 36 mm. There is mild dilatation of the ascending aorta,  measuring 39 mm.   9. The inferior vena cava is dilated in size with >50% respiratory  variability, suggesting right atrial pressure of 8 mmHg.   Cardiac PET CT 09/14/22  Normal perfusion.  Myocardial blood flow reserve is decreased, but is unreliable in setting of prior CABG.  No high risk markers such as drop in EF with stress or TID.  Low risk study   LV perfusion is normal. There is no evidence of ischemia. There is no evidence of infarction.   Rest left ventricular function is normal. Rest EF: 66 %. Stress left ventricular function is normal. Stress EF: 69 %. End diastolic cavity size is normal. End systolic cavity size is normal.   Myocardial blood flow was computed to be 1.36ml/g/min at rest and 1.55ml/g/min at stress. Global myocardial blood flow reserve was 1.45 and was abnormal, though unreliable in setting of prior CABG   Coronary calcium was present on the attenuation correction CT images. Severe coronary calcifications were present. Coronary calcifications were present in the left anterior descending artery, left circumflex artery and right coronary artery distribution(s).   The study is normal. The study is low risk.   Echo 12/31/20 1. Left ventricular ejection fraction, by estimation, is 70 to 75%. The  left ventricle has hyperdynamic function. The left ventricle has no  regional wall motion abnormalities. There is moderate left ventricular  hypertrophy. Left ventricular diastolic  function could not be evaluated.   2. Right ventricular systolic function is normal. The right ventricular  size  is normal. There is normal pulmonary artery systolic pressure.   3. The mitral valve is normal in structure. Trivial mitral valve  regurgitation. No evidence of mitral stenosis.   4. The aortic valve is tricuspid. Aortic valve regurgitation is trivial.  Mild aortic valve sclerosis is present, with no evidence of aortic valve  stenosis.   5. The inferior vena cava is normal in size with greater than 50%  respiratory variability, suggesting right atrial pressure of 3 mmHg.   Cardiac monitor 10/09/19 Sinus rhythm with sinus bradycardia Second degree AV block (Mobitz 1) No evidence of atrial fibrillation  Lexiscan Myoview 02/15/18 Nuclear stress EF: 64% with septal wall hypokinesis There was no ST segment deviation noted during stress. The study is normal. This is a low risk study. No ischemia identified.   LHC 10/31/14 Final Conclusions:   1. Severe distal left main stenosis 2. Patent LAD stent with severe diffuse mid and distal LAD stenosis 3. Diffuse nonobstructive left circumflex and RCA stenoses 4. Normal LV systolic function   Recommendations: Hospital admission to a cardiac stepdown bed, IV heparin, cardiac surgery consultation. The patient has high risk coronary anatomy with severe distal left main stem stenosis.  He has not had any resting anginal symptoms, that clearly his anatomy warrants at hospital admission, IV heparin for anticoagulation, and revascularization while he is in-house.    Recent Labs: 08/22/2022: TSH 1.810 11/02/2022: ALT 14; BUN 24; Creatinine 0.96; Potassium 4.2; Sodium 139 02/28/2023: Hemoglobin 11.3; Platelet Count 192  Recent Lipid Panel    Component Value Date/Time   CHOL 63 (L) 02/09/2023 0854   TRIG 49 02/09/2023 0854   HDL 50 02/09/2023 0854   CHOLHDL 1.3 02/09/2023 0854   CHOLHDL 4 11/16/2021 0906   VLDL 12.8 11/16/2021 0906   LDLCALC Comment (A) 02/09/2023 0854     Risk Assessment/Calculations:    CHA2DS2-VASc Score = 5  {This indicates a  7.2% annual risk of stroke. The patient's score is based upon: CHF History: 0 HTN History: 1 Diabetes History: 1 Stroke History: 0 Vascular Disease History: 1 Age Score: 2 Gender Score: 0    Physical Exam:    VS:  BP 124/76   Pulse 77   Ht 5\' 7"  (1.702 m)   Wt 178 lb 12.8 oz (81.1 kg)   SpO2 97%   BMI 28.00 kg/m     Wt Readings from Last 3 Encounters:  03/09/23 178 lb 12.8 oz (81.1 kg)  02/28/23 178 lb 11.2 oz (81.1 kg)  01/06/23 178 lb 12.8 oz (81.1 kg)     GEN: Well nourished, well developed in no acute distress HEENT: Normal NECK: No JVD; No carotid bruits CARDIAC: Irregular RR, no murmurs, rubs, gallops RESPIRATORY:  Clear to auscultation without rales, wheezing or rhonchi  ABDOMEN: Soft, non-tender, non-distended MUSCULOSKELETAL:  No edema; No deformity. 2+ pedal pulses, equal bilaterally SKIN: Warm and dry NEUROLOGIC:  Alert and oriented x 3 PSYCHIATRIC:  Normal affect   EKG:  EKG is not ordered today   Diagnoses:    1. Dyslipidemia   2. Longstanding persistent atrial fibrillation (HCC)   3. S/P CABG x 4   4. Coronary artery disease of native artery of native heart with stable angina pectoris (HCC)   5. Dyspnea on exertion   6. Nonrheumatic tricuspid valve regurgitation      Assessment and Plan:     Dyspnea on exertion: Overall feeling very well but continues to have DOE with moderate to heavy exertion. No orthopnea, PND, or edema.  He is able to complete his regular activities at home without significant symptoms.  Echo 09/20/2022 revealed normal pumping function, moderate LVH, severe biatrial enlargement. He appears euvolemic on exam.  We will continue current medical therapy.  He has requested a handicap placard for times when he cannot park close to the building.  I think this is very reasonable.  CAD with stable angina: S/p CABG x 4 2016. Low risk cardiac PET scan 09/14/22 with normal perfusion, normal EF, no evidence of ischemia or infarction. Rare  chest pain that does not worsen with exertion. Avoiding AV nodal blocking agents 2/2 bradycardia. Has resumed aspirin 81 mg daily  since stopping OAC.  He started Repatha for elevated LDL and had significant improvement with most recent lipid panel 02/09/2023 LDL too low to calculate. Reports he was advised by someone to d/c Repatha but I do not have documentation of this. He also stopped rosuvastatin. No adverse side effects. He does not like taking rosuvastatin but is agreeable to low dose. We will repeat lipid testing in 2-3 months. He will resume rosuvastatin 5 mg 3 x weekly. Continues to follow with hematology for anemia. No acute bleeding concerns today.  Continue Imdur, asa, valsartan, rosuvastatin.  Tricuspid regurgitation: Mild to moderate TR on TTE 09/20/22. As noted above, he does have DOE that he feels is stable. We will continue to monitor clinically at this time.   Longstanding atrial fibrillation on chronic anticoagulation: HR is well controlled.  He is overall asymptomatic although he does have DOE.  This is likely multifactorial.  Avoiding AV nodal blockers 2/2 bradycardia. Remains off OAC secondary to anemia. We reviewed the mechanism of anti-coagulants versus anti-platelet medications. I reviewed his stroke risk with patient and son and they verbalized understanding. He would like to remain off OAC at this time.    Hyperlipidemia LDL goal < 55: LDL too low to calculate on 02/09/23. He stopped Repatha under advisement of another provider? He did not have any adverse side effects on Repatha. He has also discontinued rosuvastatin, he did not like the way he feels when taking a statin. We will restart rosuvastatin 5 mg 3 days weekly. Will recheck lipid panel in 2-3 months. Favor resuming Repatha unless LDL remains undetectable.   Hypertension: BP is well controlled.  Stable renal function on labs completed 11/02/2022.  Disposition: 6 months with Dr. Clifton Riker or me  Medication Adjustments/Labs and  Tests Ordered: Current medicines are reviewed at length with the patient today.  Concerns regarding medicines are outlined above.  Orders Placed This Encounter  Procedures   Lipid panel   Meds ordered this encounter  Medications   rosuvastatin (CRESTOR) 5 MG tablet    Sig: Take 1 tablet (5 mg total) by mouth every Monday, Wednesday, and Friday.    Dispense:  40 tablet    Refill:  3    Patient Instructions  Medication Instructions:  Your physician has recommended you make the following change in your medication:  RESUME the Rosuvastatin 5 mg taking on Monday's, Wednesday's, & Friday's  *If you need a refill on your cardiac medications before your next appointment, please call your pharmacy*   Lab Work: 06/06/23 come to the lab FASTING FOR:  LIPID  You can come anytime afert 7:15 AM  If you have labs (blood work) drawn today and your tests are completely normal, you will receive your results only by: MyChart Message (if you have MyChart) OR A paper copy in the mail If you have any lab test that is abnormal or we need to change your treatment, we will call you to review the results.   Testing/Procedures: None ordered   Follow-Up: At [facility], you and your health needs are our priority.  As part of our continuing mission to provide you with exceptional heart care, we have created designated Provider Care Teams.  These Care Teams include your primary Cardiologist (physician) and Advanced Practice Providers (APPs -  Physician Assistants and Nurse Practitioners) who all work together to provide you with the care you need, when you need it.  We recommend signing up for the patient portal called "MyChart".  Sign up information is provided on this After Visit Summary.  MyChart is used to connect with patients for Virtual Visits (Telemedicine).  Patients are able to view lab/test results, encounter notes, upcoming appointments, etc.  Non-urgent messages can be sent to your  provider as well.   To learn more about what you can do with MyChart, go to [email or web address].    Your next appointment:   6 month(s)  Provider:   Verne Carrow, MD  or Eligha Bridegroom, NP         Other Instructions  Signed, Levi Aland, NP  03/09/2023 12:22 PM    [facility]

## 2023-03-09 ENCOUNTER — Ambulatory Visit: Payer: Medicare HMO | Attending: Cardiovascular Disease | Admitting: Nurse Practitioner

## 2023-03-09 ENCOUNTER — Encounter: Payer: Self-pay | Admitting: Nurse Practitioner

## 2023-03-09 VITALS — BP 124/76 | HR 77 | Ht 67.0 in | Wt 178.8 lb

## 2023-03-09 DIAGNOSIS — I4811 Longstanding persistent atrial fibrillation: Secondary | ICD-10-CM

## 2023-03-09 DIAGNOSIS — I25118 Atherosclerotic heart disease of native coronary artery with other forms of angina pectoris: Secondary | ICD-10-CM

## 2023-03-09 DIAGNOSIS — I10 Essential (primary) hypertension: Secondary | ICD-10-CM | POA: Diagnosis not present

## 2023-03-09 DIAGNOSIS — I361 Nonrheumatic tricuspid (valve) insufficiency: Secondary | ICD-10-CM

## 2023-03-09 DIAGNOSIS — R0609 Other forms of dyspnea: Secondary | ICD-10-CM | POA: Diagnosis not present

## 2023-03-09 DIAGNOSIS — Z951 Presence of aortocoronary bypass graft: Secondary | ICD-10-CM | POA: Diagnosis not present

## 2023-03-09 DIAGNOSIS — E785 Hyperlipidemia, unspecified: Secondary | ICD-10-CM

## 2023-03-09 MED ORDER — ROSUVASTATIN CALCIUM 5 MG PO TABS: 5.0000 mg | ORAL_TABLET | ORAL | 3 refills | Status: DC

## 2023-03-09 NOTE — Patient Instructions (Addendum)
Medication Instructions:  Your physician has recommended you make the following change in your medication:  RESUME the Rosuvastatin 5 mg taking on Monday's, Wednesday's, & Friday's  *If you need a refill on your cardiac medications before your next appointment, please call your pharmacy*   Lab Work: 06/06/23 come to the lab FASTING FOR:  LIPID  You can come anytime afert 7:15 AM  If you have labs (blood work) drawn today and your tests are completely normal, you will receive your results only by: MyChart Message (if you have MyChart) OR A paper copy in the mail If you have any lab test that is abnormal or we need to change your treatment, we will call you to review the results.   Testing/Procedures: None ordered   Follow-Up: At [facility], you and your health needs are our priority.  As part of our continuing mission to provide you with exceptional heart care, we have created designated Provider Care Teams.  These Care Teams include your primary Cardiologist (physician) and Advanced Practice Providers (APPs -  Physician Assistants and Nurse Practitioners) who all work together to provide you with the care you need, when you need it.  We recommend signing up for the patient portal called "MyChart".  Sign up information is provided on this After Visit Summary.  MyChart is used to connect with patients for Virtual Visits (Telemedicine).  Patients are able to view lab/test results, encounter notes, upcoming appointments, etc.  Non-urgent messages can be sent to your provider as well.   To learn more about what you can do with MyChart, go to [email or web address].    Your next appointment:   6 month(s)  Provider:   Verne Carrow, MD  or Eligha Bridegroom, NP         Other Instructions

## 2023-03-10 ENCOUNTER — Ambulatory Visit: Payer: Medicare HMO | Admitting: Cardiovascular Disease

## 2023-03-11 ENCOUNTER — Other Ambulatory Visit: Payer: Self-pay | Admitting: Nurse Practitioner

## 2023-03-31 ENCOUNTER — Other Ambulatory Visit: Payer: Self-pay | Admitting: Cardiovascular Disease

## 2023-04-11 ENCOUNTER — Inpatient Hospital Stay: Payer: Medicare HMO | Attending: Physician Assistant

## 2023-04-11 DIAGNOSIS — D500 Iron deficiency anemia secondary to blood loss (chronic): Secondary | ICD-10-CM | POA: Diagnosis not present

## 2023-04-11 LAB — CBC WITH DIFFERENTIAL (CANCER CENTER ONLY)
Abs Immature Granulocytes: 0.02 10*3/uL (ref 0.00–0.07)
Basophils Absolute: 0.1 10*3/uL (ref 0.0–0.1)
Basophils Relative: 1 %
Eosinophils Absolute: 0.2 10*3/uL (ref 0.0–0.5)
Eosinophils Relative: 3 %
HCT: 36 % — ABNORMAL LOW (ref 39.0–52.0)
Hemoglobin: 11.6 g/dL — ABNORMAL LOW (ref 13.0–17.0)
Immature Granulocytes: 0 %
Lymphocytes Relative: 19 %
Lymphs Abs: 1.3 10*3/uL (ref 0.7–4.0)
MCH: 30.4 pg (ref 26.0–34.0)
MCHC: 32.2 g/dL (ref 30.0–36.0)
MCV: 94.5 fL (ref 80.0–100.0)
Monocytes Absolute: 0.6 10*3/uL (ref 0.1–1.0)
Monocytes Relative: 9 %
Neutro Abs: 4.6 10*3/uL (ref 1.7–7.7)
Neutrophils Relative %: 68 %
Platelet Count: 197 10*3/uL (ref 150–400)
RBC: 3.81 MIL/uL — ABNORMAL LOW (ref 4.22–5.81)
RDW: 13.4 % (ref 11.5–15.5)
WBC Count: 6.9 10*3/uL (ref 4.0–10.5)
nRBC: 0 % (ref 0.0–0.2)

## 2023-04-11 LAB — IRON AND IRON BINDING CAPACITY (CC-WL,HP ONLY)
Iron: 209 ug/dL — ABNORMAL HIGH (ref 45–182)
Saturation Ratios: 56 % — ABNORMAL HIGH (ref 17.9–39.5)
TIBC: 372 ug/dL (ref 250–450)
UIBC: 163 ug/dL (ref 117–376)

## 2023-04-11 LAB — FERRITIN: Ferritin: 35 ng/mL (ref 24–336)

## 2023-04-13 ENCOUNTER — Telehealth: Payer: Self-pay

## 2023-04-13 NOTE — Telephone Encounter (Signed)
Pt advised of lab results and recommendations.

## 2023-04-13 NOTE — Telephone Encounter (Signed)
-----   Message from Briant Cedar sent at 04/13/2023  2:50 PM EDT ----- Please notify patient that anemia continues to improve. His iron levels are still normal. No need for IV iron but continue with iron pills. ----- Message ----- From: Leory Plowman, Lab In [place] Sent: 04/11/2023  11:43 AM EDT To: Briant Cedar, PA-C

## 2023-05-12 ENCOUNTER — Other Ambulatory Visit: Payer: Self-pay | Admitting: Physician Assistant

## 2023-05-12 ENCOUNTER — Other Ambulatory Visit: Payer: Self-pay

## 2023-06-01 ENCOUNTER — Inpatient Hospital Stay: Payer: Medicare HMO | Admitting: Hematology and Oncology

## 2023-06-01 ENCOUNTER — Inpatient Hospital Stay: Payer: Medicare HMO | Attending: Physician Assistant

## 2023-06-01 VITALS — BP 176/86 | HR 71 | Temp 98.7°F | Resp 13 | Wt 184.3 lb

## 2023-06-01 DIAGNOSIS — D500 Iron deficiency anemia secondary to blood loss (chronic): Secondary | ICD-10-CM | POA: Insufficient documentation

## 2023-06-01 DIAGNOSIS — Z87891 Personal history of nicotine dependence: Secondary | ICD-10-CM | POA: Diagnosis not present

## 2023-06-01 DIAGNOSIS — D509 Iron deficiency anemia, unspecified: Secondary | ICD-10-CM

## 2023-06-01 DIAGNOSIS — Z800 Family history of malignant neoplasm of digestive organs: Secondary | ICD-10-CM | POA: Insufficient documentation

## 2023-06-01 LAB — CBC WITH DIFFERENTIAL (CANCER CENTER ONLY)
Abs Immature Granulocytes: 0.01 10*3/uL (ref 0.00–0.07)
Basophils Absolute: 0 10*3/uL (ref 0.0–0.1)
Basophils Relative: 0 %
Eosinophils Absolute: 0.3 10*3/uL (ref 0.0–0.5)
Eosinophils Relative: 3 %
HCT: 35.9 % — ABNORMAL LOW (ref 39.0–52.0)
Hemoglobin: 11.6 g/dL — ABNORMAL LOW (ref 13.0–17.0)
Immature Granulocytes: 0 %
Lymphocytes Relative: 14 %
Lymphs Abs: 1.3 10*3/uL (ref 0.7–4.0)
MCH: 30 pg (ref 26.0–34.0)
MCHC: 32.3 g/dL (ref 30.0–36.0)
MCV: 92.8 fL (ref 80.0–100.0)
Monocytes Absolute: 0.9 10*3/uL (ref 0.1–1.0)
Monocytes Relative: 10 %
Neutro Abs: 6.7 10*3/uL (ref 1.7–7.7)
Neutrophils Relative %: 73 %
Platelet Count: 197 10*3/uL (ref 150–400)
RBC: 3.87 MIL/uL — ABNORMAL LOW (ref 4.22–5.81)
RDW: 14.5 % (ref 11.5–15.5)
WBC Count: 9.2 10*3/uL (ref 4.0–10.5)
nRBC: 0 % (ref 0.0–0.2)

## 2023-06-01 LAB — IRON AND IRON BINDING CAPACITY (CC-WL,HP ONLY)
Iron: 85 ug/dL (ref 45–182)
Saturation Ratios: 23 % (ref 17.9–39.5)
TIBC: 375 ug/dL (ref 250–450)
UIBC: 290 ug/dL (ref 117–376)

## 2023-06-01 LAB — FERRITIN: Ferritin: 30 ng/mL (ref 24–336)

## 2023-06-01 MED ORDER — FEROSUL 325 (65 FE) MG PO TABS: 325.0000 mg | ORAL_TABLET | Freq: Every day | ORAL | 2 refills | Status: DC

## 2023-06-01 NOTE — Progress Notes (Signed)
[facility] [phone]   [phone]  PROGRESS NOTE  Patient Care Team: Excell Seltzer, MD as PCP - General (Family Medicine) Kathleene Hazel, MD as PCP - Cardiology (Cardiology) Jethro Bolus, MD as Consulting Physician (Ophthalmology) Vilinda Flake, [facility] (Inactive) as Pharmacist (Pharmacist)   CHIEF COMPLAINTS/PURPOSE OF CONSULTATION:  Iron deficiency anemia 2/2 GI bleeding  PRIOR TREATMENT: 11/04/2022-12/02/2022: Received IV venofer 200 mg x 5 doses 01/18/2023-02/14/2023: Received IV venofer 200 mg x 5 doses  INTERVAL HISTORY:  [name] 88 y.o. male returns for a follow up for iron deficiency anemia. He was last seen on 8/20//2024. In the interim, he has had no major changes in his health.  On exam today, [name] reports he has been well overall in the interim since her last visit.  He reports that he is "old that is all".  He notes that he is not having any issues with shortness of breath, lightheadedness, or dizziness.  He reports he is working outside extensively including painting, plowing, and work in the yard.  He notes he is very active.  Occasionally he does have some issues with his balance.  He reports he is taking his iron pills faithfully and they are not causing any major trouble other than some constipation and dark stools.  He has not had any issues with bleeding, bruising, or overt blood in the urine or stool.  He reports that he is doing his best to try to eat iron rich foods including red meat.  He notes his blood pressure tends not to run as high as it does today and normally when he checks it at home it is much better.  He denies fevers, chills, sweats, chest pain or cough. He has no other complaints. Rest of the 10 point ROS is below.   MEDICAL HISTORY:  Past Medical History:  Diagnosis Date   CAD (coronary artery disease)    s/p stent mid LAD 2005 // s/p CABG 2016 // Myoview 8/19:  EF 64, no ischemia   Coronary  artery disease involving native coronary artery of native heart without angina pectoris    Depression    DJD (degenerative joint disease)    DM type 2 with diabetic peripheral neuropathy (HCC) 03/10/2017   E coli bacteremia 04/20/2016   Essential hypertension 04/20/2016   GERD (gastroesophageal reflux disease)    History of echocardiogram    Echo 8/19: Moderate LVH, EF 60-65, normal wall motion, grade 1 diastolic dysfunction, mild MR, mild LAE, normal RVSF, mild TR, PASP 25   History of hiatal hernia    HTN (hypertension)    Hyperlipidemia    Neuropathy due to type 2 diabetes mellitus (HCC) 03/10/2017   PERCUTANEOUS TRANSLUMINAL CORONARY ANGIOPLASTY, HX OF 11/26/2007   Annotation: With CYPHER STENT. Qualifier: Diagnosis of  By: Genelle Gather CMA, [place]     Pre-diabetes    S/P CABG x 4 11/03/2014   S/P total knee replacement 04/11/2016   Sepsis due to Escherichia coli (E. coli) (HCC) 04/20/2016   Tinea pedis 09/08/2017    SURGICAL HISTORY: Past Surgical History:  Procedure Laterality Date   BRAIN SURGERY     CARDIAC CATHETERIZATION     COLONOSCOPY     CORONARY ARTERY BYPASS GRAFT N/A 11/03/2014   Procedure: CORONARY ARTERY BYPASS GRAFTING (CABG), ON PUMP, TIMES FOUR, USING LEFT INTERNAL MAMMARY, RIGHT GREATER SAPHENOUS VEIN HARVESTED ENDOSCOPICALLY;  Surgeon: Kerin Perna, MD;  Location: [facility] OR;  Service: Open Heart Surgery;  Laterality: N/A;  LIMA-LAD; SVG-DIAG; SVG-OM; SVG-RCA   CRANIOTOMY N/A 01/18/2014   Procedure: CRANIOTOMY HEMATOMA EVACUATION SUBDURAL;  Surgeon: Hewitt Shorts, MD;  Location: [facility] OR;  Service: Neurosurgery;  Laterality: N/A;   kidney stone removal     KNEE SURGERY     LEFT HEART CATHETERIZATION WITH CORONARY ANGIOGRAM N/A 07/27/2011   Procedure: LEFT HEART CATHETERIZATION WITH CORONARY ANGIOGRAM;  Surgeon: Kathleene Hazel, MD;  Location[facility] CATH LAB;  Service: Cardiovascular;  Laterality: N/A;   LEFT HEART CATHETERIZATION WITH CORONARY ANGIOGRAM N/A  10/31/2014   Procedure: LEFT HEART CATHETERIZATION WITH CORONARY ANGIOGRAM;  Surgeon: Tonny Bollman, MD;  Location[facility] CATH LAB;  Service: Cardiovascular;  Laterality: N/A;   PTCA     Hx of it.    SIGMOIDOSCOPY     TEE WITHOUT CARDIOVERSION N/A 11/03/2014   Procedure: TRANSESOPHAGEAL ECHOCARDIOGRAM (TEE);  Surgeon: Kerin Perna, MD;  Location: [facility] OR;  Service: Open Heart Surgery;  Laterality: N/A;   TOTAL KNEE ARTHROPLASTY  02/09/2011   Right knee   TOTAL KNEE ARTHROPLASTY Left 04/11/2016   Procedure: LEFT TOTAL KNEE ARTHROPLASTY;  Surgeon: Dannielle Huh, MD;  Location: [facility] OR;  Service: Orthopedics;  Laterality: Left;   UPPER GASTROINTESTINAL ENDOSCOPY      SOCIAL HISTORY: Social History   Socioeconomic History   Marital status: Married    Spouse name: Not on file   Number of children: 3   Years of education: Not on file   Highest education level: Not on file  Occupational History   Occupation: retired  Tobacco Use   Smoking status: Former    Current packs/day: 0.00    Types: Cigarettes    Quit date: 1985    Years since quitting: 39.9   Smokeless tobacco: Former    Types: Chew    Quit date: 1985   Tobacco comments:    quit in 1985. smoked 1ppd for 35 years   Vaping Use   Vaping status: Never Used  Substance and Sexual Activity   Alcohol use: No   Drug use: No   Sexual activity: Never  Other Topics Concern   Not on file  Social History Narrative   Married with children. 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 10:20 am    Has living will,  HCPOA: jerrie Heckman, full code (reviewed 81)   Social Determinants of Health   Financial Resource Strain: Low Risk  (06/22/2021)   Overall Financial Resource Strain (CARDIA)    Difficulty of Paying Living Expenses: Not hard at all  Food Insecurity: No Food Insecurity (11/02/2022)   Hunger Vital Sign    Worried About Running Out of Food in the Last Year: Never true    Ran Out of Food  in the Last Year: Never true  Transportation Needs: No Transportation Needs (11/02/2022)   PRAPARE - Administrator, Civil Service (Medical): No    Lack of Transportation (Non-Medical): No  Physical Activity: Inactive (06/22/2021)   Exercise Vital Sign    Days of Exercise per Week: 0 days    Minutes of Exercise per Session: 0 min  Stress: No Stress Concern Present (06/22/2021)   [employer] of Occupational Health - Occupational Stress Questionnaire    Feeling of Stress : Not at all  Social Connections: Socially Integrated (06/22/2021)   Social Connection and Isolation Panel [NHANES]    Frequency of Communication with Friends and Family: More than three times a week  Frequency of Social Gatherings with Friends and Family: Three times a week    Attends Religious Services: More than 4 times per year    Active Member of Clubs or Organizations: Yes    Attends Banker Meetings: More than 4 times per year    Marital Status: Married  Catering manager Violence: Not At Risk (11/02/2022)   Humiliation, Afraid, Rape, and Kick questionnaire    Fear of Current or Ex-Partner: No    Emotionally Abused: No    Physically Abused: No    Sexually Abused: No    FAMILY HISTORY: Family History  Problem Relation Age of Onset   Heart failure Mother    COPD Mother    Heart disease Father    Heart attack Father    Heart disease Sister    Heart attack Sister    Heart attack Brother    Colon cancer Daughter 86       mets   Hypertension Son    Hypertension Son     ALLERGIES:  is allergic to protamine, statins, adhesive [tape], imodium [loperamide], and rosuvastatin calcium.  MEDICATIONS:  Current Outpatient Medications  Medication Sig Dispense Refill   acetaminophen (TYLENOL) 500 MG tablet Take 500-1,000 mg by mouth 2 (two) times daily as needed (for pain).      aspirin EC 81 MG tablet Take 81 mg by mouth daily. Swallow whole.     cholecalciferol (VITAMIN D3) 25 MCG  (1000 UNIT) tablet Take 1,000 Units by mouth daily.     FEROSUL 325 (65 Fe) MG tablet Take 1 tablet (325 mg total) by mouth daily with breakfast. 90 tablet 2   furosemide (LASIX) 20 MG tablet Take 1 tablet by mouth once daily 30 tablet 3   glucose blood (ONETOUCH ULTRA) test strip USE 1 STRIP TO CHECK GLUCOSE ONCE DAILY 100 each 3   isosorbide mononitrate (IMDUR) 30 MG 24 hr tablet Take 1 tablet by mouth once daily 90 tablet 3   nitroGLYCERIN (NITROSTAT) 0.4 MG SL tablet DISSOLVE ONE TABLET UNDER THE TONGUE EVERY 5 MINUTES AS NEEDED FOR CHEST PAIN.  DO NOT EXCEED A TOTAL OF 3 DOSES IN 15 MINUTES 25 tablet 2   rosuvastatin (CRESTOR) 5 MG tablet Take 1 tablet (5 mg total) by mouth every Monday, Wednesday, and Friday. 40 tablet 3   valsartan (DIOVAN) 40 MG tablet Take 0.5 tablets (20 mg total) by mouth daily. (Patient taking differently: Take 20 mg by mouth daily. Only takes as needed) 45 tablet 3   vitamin B-12 (CYANOCOBALAMIN) 1000 MCG tablet Take 1,000 mcg by mouth daily.     No current facility-administered medications for this visit.    REVIEW OF SYSTEMS:   Constitutional: ( - ) fevers, ( - )  chills , ( - ) night sweats Eyes: ( - ) blurriness of vision, ( - ) double vision, ( - ) watery eyes Ears, nose, mouth, throat, and face: ( - ) mucositis, ( - ) sore throat Respiratory: ( - ) cough, ( + ) dyspnea, ( - ) wheezes Cardiovascular: ( - ) palpitation, ( - ) chest discomfort, ( - ) lower extremity swelling Gastrointestinal:  ( - ) nausea, ( - ) heartburn, ( - ) change in bowel habits Skin: ( - ) abnormal skin rashes Lymphatics: ( - ) new lymphadenopathy, ( - ) easy bruising Neurological: ( - ) numbness, ( - ) tingling, ( - ) new weaknesses Behavioral/Psych: ( - ) mood change, ( - ) new  changes  All other systems were reviewed with the patient and are negative.  PHYSICAL EXAMINATION: ECOG PERFORMANCE STATUS: 1 - Symptomatic but completely ambulatory  Vitals:   06/01/23 1150  BP: (!)  176/86  Pulse: 71  Resp: 13  Temp: 98.7 F (37.1 C)  SpO2: 97%    Filed Weights   06/01/23 1150  Weight: 184 lb 4.8 oz (83.6 kg)     GENERAL: elderly appearing male in NAD  SKIN: skin color, texture, turgor are normal, no rashes or significant lesions EYES: conjunctiva are pink and non-injected, sclera clear LUNGS: clear to auscultation and percussion with normal breathing effort HEART: regular rate & rhythm and no murmurs and no lower extremity edema Musculoskeletal: no cyanosis of digits and no clubbing  PSYCH: alert & oriented x 3, fluent speech NEURO: no focal motor/sensory deficits  LABORATORY DATA:  I have reviewed the data as listed    Latest Ref Rng & Units 06/01/2023   11:26 AM 04/11/2023   11:25 AM 02/28/2023   10:53 AM  CBC  WBC 4.0 - 10.5 K/uL 9.2  6.9  4.9   Hemoglobin 13.0 - 17.0 g/dL 62.1  30.8  65.7   Hematocrit 39.0 - 52.0 % 35.9  36.0  34.4   Platelets 150 - 400 K/uL 197  197  192        Latest Ref Rng & Units 11/02/2022    3:56 PM 10/21/2022   12:13 PM 08/22/2022    9:21 AM  CMP  Glucose 70 - 99 mg/dL 846  962  952   BUN 8 - 23 mg/dL 24  20  19    Creatinine 0.61 - 1.24 mg/dL 8.41  3.24  4.01   Sodium 135 - 145 mmol/L 139  138  141   Potassium 3.5 - 5.1 mmol/L 4.2  4.0  4.7   Chloride 98 - 111 mmol/L 107  108  107   CO2 22 - 32 mmol/L 23  22  22    Calcium 8.9 - 10.3 mg/dL 9.0  9.0  9.2   Total Protein 6.5 - 8.1 g/dL 6.3   6.4   Total Bilirubin 0.3 - 1.2 mg/dL 0.5   0.3   Alkaline Phos 38 - 126 U/L 49   68   AST 15 - 41 U/L 20   13   ALT 0 - 44 U/L 14   12     ASSESSMENT & PLAN CYRILLE SHAMS is a 88 y.o. male who returns for a follow up for iron deficiency anemia.   #iron deficiency anemia 2/2 GI bleeding: --Underwent EGD/colonoscopy on 11/07/2021 with Dr. Myrtie Neither. EGD showed large hiatal hernia with gastric erosions with stigmata of recent bleeding. Colonoscopy did not show any signs of bleeding.  --Last received IV venofer 200 mg x 5 doses  from 01/18/2023-02/14/2023 --Labs today shows improvement of anemia with Hgb 11.6, white blood cell 9.2, MCV 92.8, platelets 197 --No need for additional IV iron at this time.  --Recommend to continue on ferrous sulfate 325 mg once daily with a source of vitamin C --encouraged to eat iron rich foods.  --RTC in 6 weeks for lab check and 12 weeks for labs/follow up.   No orders of the defined types were placed in this encounter.   All questions were answered. The patient knows to call the clinic with any problems, questions or concerns.  I have spent a total of 30 minutes minutes of face-to-face and non-face-to-face time, preparing to see the patient,  performing a medically appropriate examination, counseling and educating the patient, ordering medications, documenting clinical information in the electronic health record,  and care coordination.   Ulysees Barns, MD Department of Hematology/Oncology [facility] Phone: [phone] Pager: [phone] Email: [email or web address]

## 2023-06-06 ENCOUNTER — Ambulatory Visit: Payer: Medicare HMO | Attending: Nurse Practitioner

## 2023-06-06 DIAGNOSIS — E785 Hyperlipidemia, unspecified: Secondary | ICD-10-CM | POA: Diagnosis not present

## 2023-06-07 LAB — LIPID PANEL
Chol/HDL Ratio: 3.2 {ratio} (ref 0.0–5.0)
Cholesterol, Total: 131 mg/dL (ref 100–199)
HDL: 41 mg/dL (ref >39)
LDL Chol Calc (NIH): 74 mg/dL (ref 0–99)
Triglycerides: 81 mg/dL (ref 0–149)
VLDL Cholesterol Cal: 16 mg/dL (ref 5–40)

## 2023-06-14 ENCOUNTER — Ambulatory Visit: Payer: Medicare HMO

## 2023-06-14 VITALS — Ht 67.0 in | Wt 184.0 lb

## 2023-06-14 DIAGNOSIS — E1142 Type 2 diabetes mellitus with diabetic polyneuropathy: Secondary | ICD-10-CM | POA: Diagnosis not present

## 2023-06-14 DIAGNOSIS — Z0000 Encounter for general adult medical examination without abnormal findings: Secondary | ICD-10-CM

## 2023-06-14 NOTE — Progress Notes (Signed)
Subjective:   [name] is a 88 y.o. male who presents for Medicare Annual/Subsequent preventive examination.  Visit Complete: Virtual I connected with  [name] on 06/14/23 by a audio enabled telemedicine application and verified that I am speaking with the correct person using two identifiers.  Patient Location: Home  Provider Location: Office/Clinic  I discussed the limitations of evaluation and management by telemedicine. The patient expressed understanding and agreed to proceed.  Vital Signs: Because this visit was a virtual/telehealth visit, some criteria may be missing or patient reported. Any vitals not documented were not able to be obtained and vitals that have been documented are patient reported.  Patient Medicare AWV questionnaire was completed by the patient on (not done); I have confirmed that all information answered by patient is correct and no changes since this date. Cardiac Risk Factors include: advanced age (>6men, >5 women);hypertension;dyslipidemia;male gender;diabetes mellitus;sedentary lifestyle    Objective:    Today's Vitals   06/14/23 1302  Weight: 184 lb (83.5 kg)  Height: 5\' 7"  (1.702 m)   Body mass index is 28.82 kg/m.     06/14/2023    1:13 PM 10/21/2022   12:12 PM 11/26/2021    4:03 PM 06/22/2021    1:23 PM 12/28/2020    4:38 PM 12/27/2020    9:31 PM 06/17/2020    1:52 PM  Advanced Directives  Does Patient Have a Medical Advance Directive? Yes No No Yes No No Yes  Type of Estate agent of [place];Living will   Healthcare Power of [place];Living will   Healthcare Power of [place];Living will  Does patient want to make changes to medical advance directive?    Yes (MAU/Ambulatory/Procedural Areas - Information given)     Copy of Healthcare Power of Attorney in Chart? No - copy requested      No - copy requested  Would patient like information on creating a medical advance directive?  No - Patient declined Yes  (MAU/Ambulatory/Procedural Areas - Information given)  No - Patient declined      Current Medications (verified) Outpatient Encounter Medications as of 06/14/2023  Medication Sig   acetaminophen (TYLENOL) 500 MG tablet Take 500-1,000 mg by mouth 2 (two) times daily as needed (for pain).    aspirin EC 81 MG tablet Take 81 mg by mouth daily. Swallow whole.   cholecalciferol (VITAMIN D3) 25 MCG (1000 UNIT) tablet Take 1,000 Units by mouth daily.   FEROSUL 325 (65 Fe) MG tablet Take 1 tablet (325 mg total) by mouth daily with breakfast.   furosemide (LASIX) 20 MG tablet Take 1 tablet by mouth once daily   glucose blood (ONETOUCH ULTRA) test strip USE 1 STRIP TO CHECK GLUCOSE ONCE DAILY   isosorbide mononitrate (IMDUR) 30 MG 24 hr tablet Take 1 tablet by mouth once daily   nitroGLYCERIN (NITROSTAT) 0.4 MG SL tablet DISSOLVE ONE TABLET UNDER THE TONGUE EVERY 5 MINUTES AS NEEDED FOR CHEST PAIN.  DO NOT EXCEED A TOTAL OF 3 DOSES IN 15 MINUTES   valsartan (DIOVAN) 40 MG tablet Take 0.5 tablets (20 mg total) by mouth daily. (Patient taking differently: Take 20 mg by mouth daily. Only takes as needed)   vitamin B-12 (CYANOCOBALAMIN) 1000 MCG tablet Take 1,000 mcg by mouth daily.   rosuvastatin (CRESTOR) 5 MG tablet Take 1 tablet (5 mg total) by mouth every Monday, Wednesday, and Friday.   No facility-administered encounter medications on file as of 06/14/2023.    Allergies (verified) Protamine, Statins,  Adhesive [tape], Imodium [loperamide], and Rosuvastatin calcium   History: Past Medical History:  Diagnosis Date   CAD (coronary artery disease)    s/p stent mid LAD 2005 // s/p CABG 2016 // Myoview 8/19:  EF 64, no ischemia   Coronary artery disease involving native coronary artery of native heart without angina pectoris    Depression    DJD (degenerative joint disease)    DM type 2 with diabetic peripheral neuropathy (HCC) 03/10/2017   E coli bacteremia 04/20/2016   Essential hypertension  04/20/2016   GERD (gastroesophageal reflux disease)    History of echocardiogram    Echo 8/19: Moderate LVH, EF 60-65, normal wall motion, grade 1 diastolic dysfunction, mild MR, mild LAE, normal RVSF, mild TR, PASP 25   History of hiatal hernia    HTN (hypertension)    Hyperlipidemia    Neuropathy due to type 2 diabetes mellitus (HCC) 03/10/2017   PERCUTANEOUS TRANSLUMINAL CORONARY ANGIOPLASTY, HX OF 11/26/2007   Annotation: With CYPHER STENT. Qualifier: Diagnosis of  By: Genelle Gather CMA, [place]     Pre-diabetes    S/P CABG x 4 11/03/2014   S/P total knee replacement 04/11/2016   Sepsis due to Escherichia coli (E. coli) (HCC) 04/20/2016   Tinea pedis 09/08/2017   Past Surgical History:  Procedure Laterality Date   BRAIN SURGERY     CARDIAC CATHETERIZATION     COLONOSCOPY     CORONARY ARTERY BYPASS GRAFT N/A 11/03/2014   Procedure: CORONARY ARTERY BYPASS GRAFTING (CABG), ON PUMP, TIMES FOUR, USING LEFT INTERNAL MAMMARY, RIGHT GREATER SAPHENOUS VEIN HARVESTED ENDOSCOPICALLY;  Surgeon: Kerin Perna, MD;  Location: [facility] OR;  Service: Open Heart Surgery;  Laterality: N/A;  LIMA-LAD; SVG-DIAG; SVG-OM; SVG-RCA   CRANIOTOMY N/A 01/18/2014   Procedure: CRANIOTOMY HEMATOMA EVACUATION SUBDURAL;  Surgeon: Hewitt Shorts, MD;  Location[facility] OR;  Service: Neurosurgery;  Laterality: N/A;   kidney stone removal     KNEE SURGERY     LEFT HEART CATHETERIZATION WITH CORONARY ANGIOGRAM N/A 07/27/2011   Procedure: LEFT HEART CATHETERIZATION WITH CORONARY ANGIOGRAM;  Surgeon: Kathleene Hazel, MD;  Location: [facility] CATH LAB;  Service: Cardiovascular;  Laterality: N/A;   LEFT HEART CATHETERIZATION WITH CORONARY ANGIOGRAM N/A 10/31/2014   Procedure: LEFT HEART CATHETERIZATION WITH CORONARY ANGIOGRAM;  Surgeon: Tonny Bollman, MD;  Location: [facility] CATH LAB;  Service: Cardiovascular;  Laterality: N/A;   PTCA     Hx of it.    SIGMOIDOSCOPY     TEE WITHOUT CARDIOVERSION N/A 11/03/2014   Procedure: TRANSESOPHAGEAL  ECHOCARDIOGRAM (TEE);  Surgeon: Kerin Perna, MD;  Location: [facility] OR;  Service: Open Heart Surgery;  Laterality: N/A;   TOTAL KNEE ARTHROPLASTY  02/09/2011   Right knee   TOTAL KNEE ARTHROPLASTY Left 04/11/2016   Procedure: LEFT TOTAL KNEE ARTHROPLASTY;  Surgeon: Dannielle Huh, MD;  Location: [facility] OR;  Service: Orthopedics;  Laterality: Left;   UPPER GASTROINTESTINAL ENDOSCOPY     Family History  Problem Relation Age of Onset   Heart failure Mother    COPD Mother    Heart disease Father    Heart attack Father    Heart disease Sister    Heart attack Sister    Heart attack Brother    Colon cancer Daughter 66       mets   Hypertension Son    Hypertension Son    Social History   Socioeconomic History   Marital status: Married    Spouse name: Not on file   Number  of children: 3   Years of education: Not on file   Highest education level: Not on file  Occupational History   Occupation: retired  Tobacco Use   Smoking status: Former    Current packs/day: 0.00    Types: Cigarettes    Quit date: 1985    Years since quitting: 39.9   Smokeless tobacco: Former    Types: Chew    Quit date: 1985   Tobacco comments:    quit in 1985. smoked 1ppd for 35 years   Vaping Use   Vaping status: Never Used  Substance and Sexual Activity   Alcohol use: No   Drug use: No   Sexual activity: Never  Other Topics Concern   Not on file  Social History Narrative   Married with children. 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 10:20 am    Has living will,  HCPOA: jerrie Manninen, full code (reviewed 81)   Social Determinants of Health   Financial Resource Strain: Low Risk  (06/14/2023)   Overall Financial Resource Strain (CARDIA)    Difficulty of Paying Living Expenses: Not hard at all  Food Insecurity: No Food Insecurity (06/14/2023)   Hunger Vital Sign    Worried About Running Out of Food in the Last Year: Never true    Ran Out of Food in the Last  Year: Never true  Transportation Needs: No Transportation Needs (06/14/2023)   PRAPARE - Administrator, Civil Service (Medical): No    Lack of Transportation (Non-Medical): No  Physical Activity: Inactive (06/14/2023)   Exercise Vital Sign    Days of Exercise per Week: 0 days    Minutes of Exercise per Session: 0 min  Stress: No Stress Concern Present (06/14/2023)   [employer] of Occupational Health - Occupational Stress Questionnaire    Feeling of Stress : Not at all  Social Connections: Socially Integrated (06/14/2023)   Social Connection and Isolation Panel [NHANES]    Frequency of Communication with Friends and Family: More than three times a week    Frequency of Social Gatherings with Friends and Family: Three times a week    Attends Religious Services: More than 4 times per year    Active Member of Clubs or Organizations: Yes    Attends Banker Meetings: More than 4 times per year    Marital Status: Married    Tobacco Counseling Counseling given: Not Answered Tobacco comments: quit in 1985. smoked 1ppd for 35 years    Clinical Intake:  Pre-visit preparation completed: No  Pain : No/denies pain   BMI - recorded: 28.82 Nutritional Status: BMI 25 -29 Overweight Nutritional Risks: None Diabetes: Yes CBG done?: No Did pt. bring in CBG monitor from home?: No  How often do you need to have someone help you when you read instructions, pamphlets, or other written materials from your doctor or pharmacy?: 1 - Never  Interpreter Needed?: No  Comments: lives with wife Information entered by :: B.Verleen Stuckey,LPN   Activities of Daily Living    06/14/2023    1:14 PM 06/29/2022   10:58 AM  In your present state of health, do you have any difficulty performing the following activities:  Hearing? 1 1  Comment  Hearing aids  Vision? 0 0  Difficulty concentrating or making decisions? 1 0  Walking or climbing stairs? 0 0  Comment as long as rails  present Paces self when walking  Dressing or bathing? 0 0  Doing errands, shopping? 0 0  Preparing Food and eating ? N N  Using the Toilet? N N  In the past six months, have you accidently leaked urine? N N  Do you have problems with loss of bowel control? N N  Managing your Medications? N N  Managing your Finances? N N  Housekeeping or managing your Housekeeping? N N    Patient Care Team: Excell Seltzer, MD as PCP - General (Family Medicine) Kathleene Hazel, MD as PCP - Cardiology (Cardiology) Jethro Bolus, MD as Consulting Physician (Ophthalmology) Vilinda Flake, [facility] (Inactive) as Pharmacist (Pharmacist)  Indicate any recent Medical Services you may have received from other than [facility] providers in the past year (date may be approximate).     Assessment:   This is a routine wellness examination for [name].  Hearing/Vision screen Hearing Screening - Comments:: Pt says he hears less;hearing aids help Vision Screening - Comments:: Pt says his vision is good;wears readers only Dr Nile Riggs; will transition to [facility]   Goals Addressed             This Visit's Progress    Increase physical activity   Not on track    Starting 03/07/2018, I will continue to stretch for 20-30 minutes daily.      Increase physical activity   Not on track    Walking more     Patient Stated   Not on track    06/17/2020, I will continue to stretch for about 10 minutes daily.      Patient Stated   On track    Would like to maintain current routine      Depression Screen    06/14/2023    1:10 PM 11/02/2022    3:30 PM 06/29/2022   10:58 AM 11/26/2021    3:58 PM 06/22/2021    1:29 PM 06/17/2020    1:53 PM 03/22/2019   10:20 AM  PHQ 2/9 Scores  PHQ - 2 Score 0 0 0 0 0 0 0  PHQ- 9 Score      0     Fall Risk    06/14/2023    1:05 PM 08/25/2022    2:55 PM 06/29/2022   10:56 AM 11/26/2021    3:58 PM 06/22/2021    1:27 PM  Fall Risk   Falls in the past year? 0 1 0  0 0  Number falls in past yr: 0 1 0 0 0  Injury with Fall? 0 0   0  Risk for fall due to : No Fall Risks History of fall(s)   No Fall Risks  Follow up Education provided;Falls prevention discussed Falls evaluation completed Falls evaluation completed;Falls prevention discussed Falls evaluation completed Falls prevention discussed    MEDICARE RISK AT HOME: Medicare Risk at Home Any stairs in or around the home?: Yes If so, are there any without handrails?: Yes Home free of loose throw rugs in walkways, pet beds, electrical cords, etc?: Yes Adequate lighting in your home to reduce risk of falls?: Yes Life alert?: No Use of a cane, walker or w/c?: Yes (cane for balance) Grab bars in the bathroom?: No Shower chair or bench in shower?: Yes Elevated toilet seat or a handicapped toilet?: No  TIMED UP AND GO:  Was the test performed?  No    Cognitive Function:    06/17/2020    1:56 PM 03/07/2018   10:24 AM 03/03/2017    9:16 AM 01/21/2016   12:55 PM  MMSE - Mini Mental State Exam  Orientation to time 5 5 5 5   Orientation to Place 5 5 5 5   Registration 3 3 3 3   Attention/ Calculation 5 0 0 0  Recall 3 3 3 3   Language- name 2 objects  0 0 0  Language- repeat  1 1 1   Language- follow 3 step command  3 3 3   Language- read & follow direction  0 0 0  Write a sentence  0 0 0  Copy design  0 0 0  Total score  20 20 20         06/14/2023    1:16 PM 06/29/2022   11:00 AM 11/26/2021    4:01 PM  6CIT Screen  What Year? 0 points 0 points 0 points  What month? 0 points 0 points 0 points  What time? 0 points 0 points 0 points  Count back from 20 0 points 0 points 2 points  Months in reverse 0 points 0 points 0 points  Repeat phrase 0 points 0 points 0 points  Total Score 0 points 0 points 2 points    Immunizations Immunization History  Administered Date(s) Administered   Fluad Quad(high Dose 65+) 03/22/2019   PFIZER(Purple Top)SARS-COV-2 Vaccination 09/08/2019, 10/08/2019    Pneumococcal Conjugate-13 01/21/2016   Pneumococcal Polysaccharide-23 06/19/2013    TDAP status: Up to date  Flu Vaccine status: Declined, Education has been provided regarding the importance of this vaccine but patient still declined. Advised may receive this vaccine at local pharmacy or Health Dept. Aware to provide a copy of the vaccination record if obtained from local pharmacy or Health Dept. Verbalized acceptance and understanding.  Pneumococcal vaccine status: Up to date  Covid-19 vaccine status: Completed vaccines  Qualifies for Shingles Vaccine? Yes   Zostavax completed No   Shingrix Completed?: No.    Education has been provided regarding the importance of this vaccine. Patient has been advised to call insurance company to determine out of pocket expense if they have not yet received this vaccine. Advised may also receive vaccine at local pharmacy or Health Dept. Verbalized acceptance and understanding.  Screening Tests Health Maintenance  Topic Date Due   HEMOGLOBIN A1C  12/07/2022   COVID-19 Vaccine (3 - 2023-24 season) 06/30/2023 (Originally 03/12/2023)   FOOT EXAM  07/15/2023 (Originally 06/23/2021)   OPHTHALMOLOGY EXAM  08/11/2023 (Originally 03/26/2023)   Zoster Vaccines- Shingrix (1 of 2) 09/12/2023 (Originally 12/25/1986)   INFLUENZA VACCINE  10/09/2023 (Originally 02/09/2023)   DTaP/Tdap/Td (1 - Tdap) 06/13/2024 (Originally 12/25/1953)   Medicare Annual Wellness (AWV)  06/13/2024   Pneumonia Vaccine 22+ Years old  Completed   HPV VACCINES  Aged Out    Health Maintenance  Health Maintenance Due  Topic Date Due   HEMOGLOBIN A1C  12/07/2022    Colorectal cancer screening: No longer required.   Lung Cancer Screening: (Low Dose CT Chest recommended if Age 61-80 years, 20 pack-year currently smoking OR have quit w/in 15years.) does not qualify.   Lung Cancer Screening Referral: no  Additional Screening: no Hepatitis C Screening: does not qualify; Completed  no  Vision Screening: Recommended annual ophthalmology exams for early detection of glaucoma and other disorders of the eye. Is the patient up to date with their annual eye exam?  Yes  Who is the provider or what is the name of the office in which the patient attends annual eye exams? Dr Nile Riggs..retired now..will go to Dr Dion Body If pt is not established with a provider,  would they like to be referred to a provider to establish care? No .   Dental Screening: Recommended annual dental exams for proper oral hygiene  Diabetic Foot Exam: Diabetic Foot Exam: Overdue, Pt has been advised about the importance in completing this exam. Pt is scheduled for diabetic foot exam on 06/22/23 w/PCP.  Community Resource Referral / Chronic Care Management: CRR required this visit?  No  CCM required this visit?  No    Plan:     I have personally reviewed and noted the following in the patient's chart:   Medical and social history Use of alcohol, tobacco or illicit drugs  Current medications and supplements including opioid prescriptions. Patient is not currently taking opioid prescriptions. Functional ability and status Nutritional status Physical activity Advanced directives List of other physicians Hospitalizations, surgeries, and ER visits in previous 12 months Vitals Screenings to include cognitive, depression, and falls Referrals and appointments  In addition, I have reviewed and discussed with patient certain preventive protocols, quality metrics, and best practice recommendations. A written personalized care plan for preventive services as well as general preventive health recommendations were provided to patient.    Sue Lush, LPN   16/07/958   After Visit Summary: (MyChart) Due to this being a telephonic visit, the after visit summary with patients personalized plan was offered to patient via MyChart   Nurse Notes: The patient states he is doing well and has no concerns or  questions at this time.

## 2023-06-14 NOTE — Patient Instructions (Addendum)
[name] , Thank you for taking time to come for your Medicare Wellness Visit. I appreciate your ongoing commitment to your health goals. Please review the following plan we discussed and let me know if I can assist you in the future.   Referrals/Orders/Follow-Ups/Clinician Recommendations: none  This is a list of the screening recommended for you and due dates:  Health Maintenance  Topic Date Due   DTaP/Tdap/Td vaccine (1 - Tdap) Never done   Zoster (Shingles) Vaccine (1 of 2) Never done   Complete foot exam   06/23/2021   Hemoglobin A1C  12/07/2022   Flu Shot  02/09/2023   COVID-19 Vaccine (3 - 2023-24 season) 03/12/2023   Eye exam for diabetics  03/26/2023   Medicare Annual Wellness Visit  06/13/2024   Pneumonia Vaccine  Completed   HPV Vaccine  Aged Out    Advanced directives: (Copy Requested) Please bring a copy of your health care power of attorney and living will to the office to be added to your chart at your convenience.  Next Medicare Annual Wellness Visit scheduled for next year: Yes 06/14/24 @ 1pm telephone

## 2023-06-22 ENCOUNTER — Encounter: Payer: Self-pay | Admitting: Family Medicine

## 2023-06-22 ENCOUNTER — Ambulatory Visit: Payer: Medicare HMO | Admitting: Family Medicine

## 2023-06-22 VITALS — BP 156/82 | HR 73 | Temp 97.9°F | Ht 65.75 in | Wt 183.4 lb

## 2023-06-22 DIAGNOSIS — D500 Iron deficiency anemia secondary to blood loss (chronic): Secondary | ICD-10-CM | POA: Diagnosis not present

## 2023-06-22 DIAGNOSIS — K257 Chronic gastric ulcer without hemorrhage or perforation: Secondary | ICD-10-CM | POA: Diagnosis not present

## 2023-06-22 DIAGNOSIS — I480 Paroxysmal atrial fibrillation: Secondary | ICD-10-CM

## 2023-06-22 DIAGNOSIS — I152 Hypertension secondary to endocrine disorders: Secondary | ICD-10-CM

## 2023-06-22 DIAGNOSIS — Z0000 Encounter for general adult medical examination without abnormal findings: Secondary | ICD-10-CM

## 2023-06-22 DIAGNOSIS — E1169 Type 2 diabetes mellitus with other specified complication: Secondary | ICD-10-CM

## 2023-06-22 DIAGNOSIS — E785 Hyperlipidemia, unspecified: Secondary | ICD-10-CM

## 2023-06-22 DIAGNOSIS — R2689 Other abnormalities of gait and mobility: Secondary | ICD-10-CM

## 2023-06-22 DIAGNOSIS — E1140 Type 2 diabetes mellitus with diabetic neuropathy, unspecified: Secondary | ICD-10-CM | POA: Diagnosis not present

## 2023-06-22 DIAGNOSIS — E1142 Type 2 diabetes mellitus with diabetic polyneuropathy: Secondary | ICD-10-CM

## 2023-06-22 DIAGNOSIS — E1159 Type 2 diabetes mellitus with other circulatory complications: Secondary | ICD-10-CM

## 2023-06-22 LAB — POCT GLYCOSYLATED HEMOGLOBIN (HGB A1C): Hemoglobin A1C: 5.7 % — AB (ref 4.0–5.6)

## 2023-06-22 MED ORDER — FLUTICASONE PROPIONATE 50 MCG/ACT NA SUSP: 2.0000 | Freq: Every day | NASAL | 6 refills | Status: DC

## 2023-06-22 NOTE — Addendum Note (Signed)
Addended by: Nanci Pina on: 06/22/2023 11:24 AM   Modules accepted: Orders

## 2023-06-22 NOTE — Assessment & Plan Note (Addendum)
Chronic, stable diet controlled in past.. check A1C today Associated with hypertension and peripheral neuropathy.

## 2023-06-22 NOTE — Patient Instructions (Addendum)
Flonase 2 srpray per nostril daily.  Start routine daily walking to improve conditioning.  If balance not improving... call for PT referral for  balance retraining.  If breathing not continuing to improve.. call for trial of inhaler for COPD.Marland KitchenMarland KitchenSpiriva or symbicort.  Can consider increasing rosuvastatin to 5 mg EVERY day.

## 2023-06-22 NOTE — Assessment & Plan Note (Signed)
Cause of anemia and chronic blood loss. Followed by GI

## 2023-06-22 NOTE — Assessment & Plan Note (Signed)
Due to diabetes, stable on no medication.

## 2023-06-22 NOTE — Progress Notes (Signed)
5 mg daily.   Patient ID: [name], male    DOB: Jan 05, 1935, 88 y.o.   MRN: [id number]  This visit was conducted in person.  BP (!) 156/82   Pulse 73   Temp 97.9 F (36.6 C)   Ht 5' 5.75" (1.67 m)   Wt 183 lb 6 oz (83.2 kg)   SpO2 96%   BMI 29.82 kg/m    CC:  Chief Complaint  Patient presents with   Annual Exam    MCR prt 2 [AWV- 06/14/23].    Subjective:   HPI: [name] is a 88 y.o. male presenting on 06/22/2023 for Annual Exam (MCR prt 2 [AWV- 06/14/23].)  The patient presents for complete physical and review of chronic health problems. He/She also has the following acute concerns today: feeling better overall since anemia improved but still some balance issues, SOB.  Use cane when going out.  Feeling better off Eliquis... on baby aspirin   Has post nasal drip... using salt water gargle.  Likely deconditioned.. minimal exercsi  Former smoker...  The patient saw a LPN or RN for medicare wellness visit. 06/14/2023  Prevention and wellness was reviewed in detail. Note reviewed and important notes copied below.  Flowsheet Row Clinical Support from 06/14/2023 in [facility]  PHQ-2 Total Score 0      Diabetes:   Diet controlled. DUE Lab Results  Component Value Date   HGBA1C 5.7 (A) 06/08/2022  Using medications without difficulties: Hypoglycemic episodes: Hyperglycemic episodes: Feet problems: no uclers Blood Sugars averaging: eye exam within last year:  Elevated Cholesterol: LDL at goal < 70  ( per cardiology now  goal < 55) on crestor 5 mg daily, considering starting Repatha vs increasing frequency. Lab Results  Component Value Date   CHOL 131 06/06/2023   HDL 41 06/06/2023   LDLCALC 74 06/06/2023   TRIG 81 06/06/2023   CHOLHDL 3.2 06/06/2023  Using medications without problems: Muscle aches:  Diet compliance: moderate Exercise: trying to increase walking Other complaints:  Hypertension:    Well controlled in  office today on lisinopril 40 mg daily, amlodipine 5 mg  BP Readings from Last 3 Encounters:  06/22/23 (!) 156/82  06/01/23 (!) 176/86  03/09/23 124/76  Using medication without problems or lightheadedness:  none Chest pain with exertion: none Edema: Short of breath: Improved since PNA last year. Average home BPs:  120/70 Other issues:   PAF:   Followed by cardiology. Dr. Dicie Beam  No longer on eliquis anticoagulation given anemia and bleeding risk.  Microcytic anemia: S/P endoscopy and colonocsopy 42024 EGD showed large hiatal hernia with gastric erosions with stigmata of recent bleeding. Colonoscopy did not show any signs of bleeding.   Followed by hematology, reviewed last office visit note from June 01, 2023 Recent labs show significant improvement in hemoglobin, now at 11.6, iron panel within normal limits. On IV venofer, continuing ferrous sulfate 325 mg daily No longer on  eliquis.         Relevant past medical, surgical, family and social history reviewed and updated as indicated. Interim medical history since our last visit reviewed. Allergies and medications reviewed and updated. Outpatient Medications Prior to Visit  Medication Sig Dispense Refill   acetaminophen (TYLENOL) 500 MG tablet Take 500-1,000 mg by mouth 2 (two) times daily as needed (for pain).      aspirin EC 81 MG tablet Take 81 mg by mouth daily. Swallow whole.     cholecalciferol (  VITAMIN D3) 25 MCG (1000 UNIT) tablet Take 1,000 Units by mouth daily.     FEROSUL 325 (65 Fe) MG tablet Take 1 tablet (325 mg total) by mouth daily with breakfast. 90 tablet 2   furosemide (LASIX) 20 MG tablet Take 1 tablet by mouth once daily 30 tablet 3   glucose blood (ONETOUCH ULTRA) test strip USE 1 STRIP TO CHECK GLUCOSE ONCE DAILY 100 each 3   isosorbide mononitrate (IMDUR) 30 MG 24 hr tablet Take 1 tablet by mouth once daily 90 tablet 3   nitroGLYCERIN (NITROSTAT) 0.4 MG SL tablet DISSOLVE ONE TABLET UNDER THE TONGUE  EVERY 5 MINUTES AS NEEDED FOR CHEST PAIN.  DO NOT EXCEED A TOTAL OF 3 DOSES IN 15 MINUTES 25 tablet 2   valsartan (DIOVAN) 40 MG tablet Take 0.5 tablets (20 mg total) by mouth daily. (Patient taking differently: Take 20 mg by mouth daily. Only takes as needed) 45 tablet 3   vitamin B-12 (CYANOCOBALAMIN) 1000 MCG tablet Take 1,000 mcg by mouth daily.     rosuvastatin (CRESTOR) 5 MG tablet Take 1 tablet (5 mg total) by mouth every Monday, Wednesday, and Friday. 40 tablet 3   No facility-administered medications prior to visit.     Per HPI unless specifically indicated in ROS section below Review of Systems  Constitutional:  Negative for fatigue and fever.  HENT:  Negative for ear pain.   Eyes:  Negative for pain.  Respiratory:  Positive for shortness of breath. Negative for cough.   Cardiovascular:  Negative for chest pain, palpitations and leg swelling.  Gastrointestinal:  Negative for abdominal pain.  Genitourinary:  Negative for dysuria.  Musculoskeletal:  Negative for arthralgias.  Neurological:  Negative for syncope, light-headedness and headaches.  Psychiatric/Behavioral:  Negative for dysphoric mood.    Objective:  BP (!) 156/82   Pulse 73   Temp 97.9 F (36.6 C)   Ht 5' 5.75" (1.67 m)   Wt 183 lb 6 oz (83.2 kg)   SpO2 96%   BMI 29.82 kg/m   Wt Readings from Last 3 Encounters:  06/22/23 183 lb 6 oz (83.2 kg)  06/14/23 184 lb (83.5 kg)  06/01/23 184 lb 4.8 oz (83.6 kg)      Physical Exam Constitutional:      Appearance: He is well-developed.  HENT:     Head: Normocephalic.     Right Ear: Hearing normal.     Left Ear: Hearing normal.     Nose: Nose normal.  Neck:     Thyroid: No thyroid mass or thyromegaly.     Vascular: No carotid bruit.     Trachea: Trachea normal.  Cardiovascular:     Rate and Rhythm: Normal rate and regular rhythm.     Pulses: Normal pulses.     Heart sounds: Heart sounds not distant. No murmur heard.    No friction rub. No gallop.      Comments: No peripheral edema Pulmonary:     Effort: Pulmonary effort is normal. No respiratory distress.     Breath sounds: Normal breath sounds.  Skin:    General: Skin is warm and dry.     Findings: No rash.  Psychiatric:        Speech: Speech normal.        Behavior: Behavior normal.        Thought Content: Thought content normal.       Diabetic foot exam: Normal inspection No skin breakdown No calluses  Normal DP pulses  Normal sensation to light touch and monofilament Nails normal  Results for orders placed or performed in visit on 06/06/23  Lipid panel   Collection Time: 06/06/23  9:36 AM  Result Value Ref Range   Cholesterol, Total 131 100 - 199 mg/dL   Triglycerides 81 0 - 149 mg/dL   HDL 41 >16 mg/dL   VLDL Cholesterol Cal 16 5 - 40 mg/dL   LDL Chol Calc (NIH) 74 0 - 99 mg/dL   Chol/HDL Ratio 3.2 0.0 - 5.0 ratio     COVID 19 screen:  No recent travel or known exposure to COVID19 The patient denies respiratory symptoms of COVID 19 at this time. The importance of social distancing was discussed today.   Assessment and Plan   The patient's preventative maintenance and recommended screening tests for an annual wellness exam were reviewed in full today. Brought up to date unless services declined.  Counselled on the importance of diet, exercise, and its role in overall health and mortality. The patient's FH and SH was reviewed, including their home life, tobacco status, and drug and alcohol status.   Vaccines: UPTODATE with  Tdap, flu,  Due for COVID booster  colonoscopy  10/2022, no further indicated  former smoker, remote  Problem List Items Addressed This Visit     Chronic gastric erosion   Cause of anemia and chronic blood loss. Followed by GI      DM type 2 with diabetic peripheral neuropathy (HCC) (Chronic)   Chronic, stable diet controlled in past.. check A1C today Associated with hypertension and peripheral neuropathy.      Hyperlipidemia  associated with type 2 diabetes mellitus (HCC) (Chronic)   Stable, chronic.  Continue current medication. LDL goal less than 70 ( per cards < 55 now) ( CAD)  Crestor 5 mg  every other day... will increase to daily       Hypertension associated with diabetes (HCC) (Chronic)   Stable, chronic.  Continue current medication.   lisinopril 40 mg daily, amlodipine 5 mg daily        Iron deficiency anemia due to chronic blood loss   Chronic EGD showed large hiatal hernia with gastric erosions with stigmata of recent bleeding. Colonoscopy did not show any signs of bleeding.   Followed by hematology on IV Venofer On ferrous sulfate 325 mg p.o. daily      Neuropathy due to type 2 diabetes mellitus (HCC) (Chronic)   Due to diabetes, stable on no medication.      PAF (paroxysmal atrial fibrillation) (HCC)   No longer Eliquis anticoagulation... SE.... chosen to take aspirin few days aweek... encouraged him to consider taking 325 mg daily. Followed by Dr. Clifton Mitesh, cardiology.      Other Visit Diagnoses       Routine general medical examination at a health care facility    -  Primary     Balance problem            Kerby Nora, MD

## 2023-06-22 NOTE — Assessment & Plan Note (Signed)
Stable, chronic.  Continue current medication.   lisinopril 40 mg daily , amlodipine 5 mg daily

## 2023-06-22 NOTE — Assessment & Plan Note (Addendum)
No longer Eliquis anticoagulation... SE.... chosen to take aspirin few days aweek... encouraged him to consider taking 325 mg daily. Followed by Dr. Clifton Mubashir, cardiology.

## 2023-06-22 NOTE — Assessment & Plan Note (Addendum)
Stable, chronic.  Continue current medication. LDL goal less than 70 ( per cards < 55 now) ( CAD)  Crestor 5 mg  every other day... will increase to daily

## 2023-06-22 NOTE — Assessment & Plan Note (Addendum)
Chronic EGD showed large hiatal hernia with gastric erosions with stigmata of recent bleeding. Colonoscopy did not show any signs of bleeding.   Followed by hematology on IV Venofer On ferrous sulfate 325 mg p.o. daily

## 2023-07-13 ENCOUNTER — Telehealth: Payer: Medicare HMO | Admitting: Physician Assistant

## 2023-07-13 DIAGNOSIS — J208 Acute bronchitis due to other specified organisms: Secondary | ICD-10-CM | POA: Diagnosis not present

## 2023-07-13 DIAGNOSIS — B9689 Other specified bacterial agents as the cause of diseases classified elsewhere: Secondary | ICD-10-CM

## 2023-07-13 MED ORDER — BENZONATATE 100 MG PO CAPS: 100.0000 mg | ORAL_CAPSULE | Freq: Three times a day (TID) | ORAL | 0 refills | Status: DC | PRN

## 2023-07-13 MED ORDER — SPACER/AERO-HOLDING CHAMBERS DEVI: 0 refills | Status: DC

## 2023-07-13 MED ORDER — AZITHROMYCIN 250 MG PO TABS: ORAL_TABLET | ORAL | 0 refills | Status: AC

## 2023-07-13 MED ORDER — ALBUTEROL SULFATE HFA 108 (90 BASE) MCG/ACT IN AERS: 1.0000 | INHALATION_SPRAY | Freq: Four times a day (QID) | RESPIRATORY_TRACT | 0 refills | Status: DC | PRN

## 2023-07-13 NOTE — Progress Notes (Signed)
 Virtual Visit Consent   [name], you are scheduled for a virtual visit with a [facility] provider today. Just as with appointments in the office, your consent must be obtained to participate. Your consent will be active for this visit and any virtual visit you may have with one of our providers in the next 365 days. If you have a MyChart account, a copy of this consent can be sent to you electronically.  As this is a virtual visit, video technology does not allow for your provider to perform a traditional examination. This may limit your provider's ability to fully assess your condition. If your provider identifies any concerns that need to be evaluated in person or the need to arrange testing (such as labs, EKG, etc.), we will make arrangements to do so. Although advances in technology are sophisticated, we cannot ensure that it will always work on either your end or our end. If the connection with a video visit is poor, the visit may have to be switched to a telephone visit. With either a video or telephone visit, we are not always able to ensure that we have a secure connection.  By engaging in this virtual visit, you consent to the provision of healthcare and authorize for your insurance to be billed (if applicable) for the services provided during this visit. Depending on your insurance coverage, you may receive a charge related to this service.  I need to obtain your verbal consent now. Are you willing to proceed with your visit today? [name] has provided verbal consent on 07/13/2023 for a virtual visit (video or telephone). Craig [name], PA-C  Date: 07/13/2023 3:21 PM  Virtual Visit via Video Note   I, [name], connected with  [name]  ([id number], May 03, 1935) on 07/13/23 at  3:15 PM EST by a video-enabled telemedicine application and verified that I am speaking with the correct person using two identifiers. Son, [name], assisted with the  visit.  Location: Patient: Virtual Visit Location Patient: Home Provider: Virtual Visit Location Provider: Home Office   I discussed the limitations of evaluation and management by telemedicine and the availability of in person appointments. The patient expressed understanding and agreed to proceed.    History of Present Illness: [name] is a 89 y.o. who identifies as a male who was assigned male at birth, and is being seen today for cough.  HPI: URI  This is a new problem. The current episode started 1 to 4 weeks ago (Started right after Christmas). The problem has been gradually worsening. There has been no fever. Associated symptoms include congestion, coughing, headaches, nausea and wheezing. Pertinent negatives include no chest pain, diarrhea, ear pain, plugged ear sensation, rhinorrhea, sinus pain, sore throat or vomiting. Associated symptoms comments: Mild hemoptysis streaked on mucus just today. He has tried increased fluids (mucinex ) for the symptoms. The treatment provided no relief.   Yesterday BS was 202, got it down to 173; normally runs around 120-130s He does have a hiatal hernia.   Problems:  Patient Active Problem List   Diagnosis Date Noted   Chronic gastric erosion 06/22/2023   Myalgia due to statin 10/28/2022   Chronic anticoagulation 09/30/2022   Iron  deficiency anemia 09/30/2022   Iron  deficiency anemia due to chronic blood loss 08/25/2022   Pleural effusion 09/16/2021   PAF (paroxysmal atrial fibrillation) (HCC) 01/08/2021   Streptococcal bacteremia 12/29/2020   Right carpal tunnel syndrome 03/16/2018   Hyperlipidemia associated with type  2 diabetes mellitus (HCC) 02/07/2018   DM type 2 with diabetic peripheral neuropathy (HCC) 03/10/2017   Neuropathy due to type 2 diabetes mellitus (HCC) 03/10/2017   Hypertension associated with diabetes (HCC) 04/20/2016   S/P total knee replacement 04/11/2016   Coronary artery disease involving native coronary artery of  native heart without angina pectoris    S/P CABG x 4 11/03/2014   PERCUTANEOUS TRANSLUMINAL CORONARY ANGIOPLASTY, HX OF 11/26/2007    Allergies:  Allergies  Allergen Reactions   Protamine  Rash    Hypotension, Rash, unstable vitals.     Statins Other (See Comments)    Caused soreness (Lipitor, Zocor)   Adhesive [Tape] Hives   Imodium  [Loperamide ] Itching and Rash   Rosuvastatin  Calcium  Other (See Comments)    Caused soreness (Lipitor, Zocor) 12/28/2020 Pt can tolerate low dose   Medications:  Current Outpatient Medications:    acetaminophen  (TYLENOL ) 500 MG tablet, Take 500-1,000 mg by mouth 2 (two) times daily as needed (for pain). , Disp: , Rfl:    albuterol  (VENTOLIN  HFA) 108 (90 Base) MCG/ACT inhaler, Inhale 1-2 puffs into the lungs every 6 (six) hours as needed., Disp: 8 g, Rfl: 0   aspirin  EC 81 MG tablet, Take 81 mg by mouth daily. Swallow whole., Disp: , Rfl:    azithromycin  (ZITHROMAX ) 250 MG tablet, Take 2 tablets on day 1, then 1 tablet daily on days 2 through 5, Disp: 6 tablet, Rfl: 0   benzonatate  (TESSALON ) 100 MG capsule, Take 1 capsule (100 mg total) by mouth 3 (three) times daily as needed., Disp: 30 capsule, Rfl: 0   cholecalciferol  (VITAMIN D3) 25 MCG (1000 UNIT) tablet, Take 1,000 Units by mouth daily., Disp: , Rfl:    FEROSUL 325 (65 Fe) MG tablet, Take 1 tablet (325 mg total) by mouth daily with breakfast., Disp: 90 tablet, Rfl: 2   fluticasone  (FLONASE ) 50 MCG/ACT nasal spray, Place 2 sprays into both nostrils daily., Disp: 16 g, Rfl: 6   furosemide  (LASIX ) 20 MG tablet, Take 1 tablet by mouth once daily, Disp: 30 tablet, Rfl: 3   glucose blood (ONETOUCH ULTRA) test strip, USE 1 STRIP TO CHECK GLUCOSE ONCE DAILY, Disp: 100 each, Rfl: 3   isosorbide  mononitrate (IMDUR ) 30 MG 24 hr tablet, Take 1 tablet by mouth once daily, Disp: 90 tablet, Rfl: 3   nitroGLYCERIN  (NITROSTAT ) 0.4 MG SL tablet, DISSOLVE ONE TABLET UNDER THE TONGUE EVERY 5 MINUTES AS NEEDED FOR CHEST  PAIN.  DO NOT EXCEED A TOTAL OF 3 DOSES IN 15 MINUTES, Disp: 25 tablet, Rfl: 2   rosuvastatin  (CRESTOR ) 5 MG tablet, Take 1 tablet (5 mg total) by mouth every Monday, Wednesday, and Friday., Disp: 40 tablet, Rfl: 3   Spacer/Aero-Holding Chambers DEVI, Use with inhaler, Disp: 1 each, Rfl: 0   valsartan  (DIOVAN ) 40 MG tablet, Take 0.5 tablets (20 mg total) by mouth daily. (Patient taking differently: Take 20 mg by mouth daily. Only takes as needed), Disp: 45 tablet, Rfl: 3   vitamin B-12 (CYANOCOBALAMIN ) 1000 MCG tablet, Take 1,000 mcg by mouth daily., Disp: , Rfl:   Observations/Objective: Patient is well-developed, well-nourished in no acute distress.  Resting comfortably at home.  Head is normocephalic, atraumatic.  No labored breathing.  Speech is clear and coherent with logical content.  Patient is alert and oriented at baseline.  Deep, harsh cough heard a few times but not affecting speech  Assessment and Plan: 1. Acute bacterial bronchitis (Primary) - azithromycin  (ZITHROMAX ) 250 MG tablet; Take 2  tablets on day 1, then 1 tablet daily on days 2 through 5  Dispense: 6 tablet; Refill: 0 - benzonatate  (TESSALON ) 100 MG capsule; Take 1 capsule (100 mg total) by mouth 3 (three) times daily as needed.  Dispense: 30 capsule; Refill: 0 - albuterol  (VENTOLIN  HFA) 108 (90 Base) MCG/ACT inhaler; Inhale 1-2 puffs into the lungs every 6 (six) hours as needed.  Dispense: 8 g; Refill: 0 - Spacer/Aero-Holding Raguel FRENCH; Use with inhaler  Dispense: 1 each; Refill: 0  - Worsening over a week despite OTC medications - Will treat with Z-pack, Albuterol  and tessalon  perles - Can continue Mucinex  (plain) - Push fluids.  - Rest.  - Steam and humidifier can help - Seek in person evaluation if worsening or symptoms fail to improve    Follow Up Instructions: I discussed the assessment and treatment plan with the patient. The patient was provided an opportunity to ask questions and all were answered.  The patient agreed with the plan and demonstrated an understanding of the instructions.  A copy of instructions were sent to the patient via MyChart unless otherwise noted below.    The patient was advised to call back or seek an in-person evaluation if the symptoms worsen or if the condition fails to improve as anticipated.    Craig [name], PA-C

## 2023-07-13 NOTE — Patient Instructions (Signed)
 [name], thank you for joining Delon [name], PA-C for today's virtual visit.  While this provider is not your primary care provider (PCP), if your PCP is located in our provider database this encounter information will be shared with them immediately following your visit.   A [name] MyChart account gives you access to today's visit and all your visits, tests, and labs performed at [facility]  click here if you don't have a [name] MyChart account or go to [email or web address]  Consent: (Patient) [name] provided verbal consent for this virtual visit at the beginning of the encounter.  Current Medications:  Current Outpatient Medications:    albuterol  (VENTOLIN  HFA) 108 (90 Base) MCG/ACT inhaler, Inhale 1-2 puffs into the lungs every 6 (six) hours as needed., Disp: 8 g, Rfl: 0   azithromycin  (ZITHROMAX ) 250 MG tablet, Take 2 tablets on day 1, then 1 tablet daily on days 2 through 5, Disp: 6 tablet, Rfl: 0   benzonatate  (TESSALON ) 100 MG capsule, Take 1 capsule (100 mg total) by mouth 3 (three) times daily as needed., Disp: 30 capsule, Rfl: 0   Spacer/Aero-Holding Chambers DEVI, Use with inhaler, Disp: 1 each, Rfl: 0   acetaminophen  (TYLENOL ) 500 MG tablet, Take 500-1,000 mg by mouth 2 (two) times daily as needed (for pain). , Disp: , Rfl:    aspirin  EC 81 MG tablet, Take 81 mg by mouth daily. Swallow whole., Disp: , Rfl:    cholecalciferol  (VITAMIN D3) 25 MCG (1000 UNIT) tablet, Take 1,000 Units by mouth daily., Disp: , Rfl:    FEROSUL 325 (65 Fe) MG tablet, Take 1 tablet (325 mg total) by mouth daily with breakfast., Disp: 90 tablet, Rfl: 2   fluticasone  (FLONASE ) 50 MCG/ACT nasal spray, Place 2 sprays into both nostrils daily., Disp: 16 g, Rfl: 6   furosemide  (LASIX ) 20 MG tablet, Take 1 tablet by mouth once daily, Disp: 30 tablet, Rfl: 3   glucose blood (ONETOUCH ULTRA) test strip, USE 1 STRIP TO CHECK GLUCOSE ONCE DAILY, Disp: 100 each, Rfl: 3    isosorbide  mononitrate (IMDUR ) 30 MG 24 hr tablet, Take 1 tablet by mouth once daily, Disp: 90 tablet, Rfl: 3   nitroGLYCERIN  (NITROSTAT ) 0.4 MG SL tablet, DISSOLVE ONE TABLET UNDER THE TONGUE EVERY 5 MINUTES AS NEEDED FOR CHEST PAIN.  DO NOT EXCEED A TOTAL OF 3 DOSES IN 15 MINUTES, Disp: 25 tablet, Rfl: 2   rosuvastatin  (CRESTOR ) 5 MG tablet, Take 1 tablet (5 mg total) by mouth every Monday, Wednesday, and Friday., Disp: 40 tablet, Rfl: 3   valsartan  (DIOVAN ) 40 MG tablet, Take 0.5 tablets (20 mg total) by mouth daily. (Patient taking differently: Take 20 mg by mouth daily. Only takes as needed), Disp: 45 tablet, Rfl: 3   vitamin B-12 (CYANOCOBALAMIN ) 1000 MCG tablet, Take 1,000 mcg by mouth daily., Disp: , Rfl:    Medications ordered in this encounter:  Meds ordered this encounter  Medications   azithromycin  (ZITHROMAX ) 250 MG tablet    Sig: Take 2 tablets on day 1, then 1 tablet daily on days 2 through 5    Dispense:  6 tablet    Refill:  0    Supervising Provider:   LAMPTEY, PHILIP O [[id number]]   benzonatate  (TESSALON ) 100 MG capsule    Sig: Take 1 capsule (100 mg total) by mouth 3 (three) times daily as needed.    Dispense:  30 capsule    Refill:  0    Supervising  Provider:   LAMPTEY, PHILIP O [[id number]]   albuterol  (VENTOLIN  HFA) 108 (90 Base) MCG/ACT inhaler    Sig: Inhale 1-2 puffs into the lungs every 6 (six) hours as needed.    Dispense:  8 g    Refill:  0    Supervising Provider:   BLAISE ALEENE KIDD [[id number]]   Spacer/Aero-Holding Raguel FRENCH    Sig: Use with inhaler    Dispense:  1 each    Refill:  0    Supervising Provider:   BLAISE ALEENE KIDD [phone]     *If you need refills on other medications prior to your next appointment, please contact your pharmacy*  Follow-Up: Call back or seek an in-person evaluation if the symptoms worsen or if the condition fails to improve as anticipated.  [facility] [phone]  Other Instructions Acute Bronchitis,  Adult  Acute bronchitis is sudden inflammation of the main airways (bronchi) that come off the windpipe (trachea) in the lungs. The swelling causes the airways to get smaller and make more mucus than normal. This can make it hard to breathe and can cause coughing or noisy breathing (wheezing). Acute bronchitis may last several weeks. The cough may last longer. Allergies, asthma, and exposure to smoke may make the condition worse. What are the causes? This condition can be caused by germs and by substances that irritate the lungs, including: Cold and flu viruses. The most common cause of this condition is the virus that causes the common cold. Bacteria. This is less common. Breathing in substances that irritate the lungs, including: Smoke from cigarettes and other forms of tobacco. Dust and pollen. Fumes from household cleaning products, gases, or burned fuel. Indoor or outdoor air pollution. What increases the risk? The following factors may make you more likely to develop this condition: A weak body's defense system, also called the immune system. A condition that affects your lungs and breathing, such as asthma. What are the signs or symptoms? Common symptoms of this condition include: Coughing. This may bring up clear, yellow, or green mucus from your lungs (sputum). Wheezing. Runny or stuffy nose. Having too much mucus in your lungs (chest congestion). Shortness of breath. Aches and pains, including sore throat or chest. How is this diagnosed? This condition is usually diagnosed based on: Your symptoms and medical history. A physical exam. You may also have other tests, including tests to rule out other conditions, such as pneumonia. These tests include: A test of lung function. Test of a mucus sample to look for the presence of bacteria. Tests to check the oxygen  level in your blood. Blood tests. Chest X-ray. How is this treated? Most cases of acute bronchitis clear up over  time without treatment. Your health care provider may recommend: Drinking more fluids to help thin your mucus so it is easier to cough up. Taking inhaled medicine (inhaler) to improve air flow in and out of your lungs. Using a vaporizer or a humidifier. These are machines that add water to the air to help you breathe better. Taking a medicine that thins mucus and clears congestion (expectorant). Taking a medicine that prevents or stops coughing (cough suppressant). It is not common to take an antibiotic medicine for this condition. Follow these instructions at home:  Take over-the-counter and prescription medicines only as told by your health care provider. Use an inhaler, vaporizer, or humidifier as told by your health care provider. Take two teaspoons (10 mL) of honey at bedtime to lessen coughing at  night. Drink enough fluid to keep your urine pale yellow. Do not use any products that contain nicotine or tobacco. These products include cigarettes, chewing tobacco, and vaping devices, such as e-cigarettes. If you need help quitting, ask your health care provider. Get plenty of rest. Return to your normal activities as told by your health care provider. Ask your health care provider what activities are safe for you. Keep all follow-up visits. This is important. How is this prevented? To lower your risk of getting this condition again: Wash your hands often with soap and water for at least 20 seconds. If soap and water are not available, use hand sanitizer. Avoid contact with people who have cold symptoms. Try not to touch your mouth, nose, or eyes with your hands. Avoid breathing in smoke or chemical fumes. Breathing smoke or chemical fumes will make your condition worse. Get the flu shot every year. Contact a health care provider if: Your symptoms do not improve after 2 weeks. You have trouble coughing up the mucus. Your cough keeps you awake at night. You have a fever. Get help right  away if you: Cough up blood. Feel pain in your chest. Have severe shortness of breath. Faint or keep feeling like you are going to faint. Have a severe headache. Have a fever or chills that get worse. These symptoms may represent a serious problem that is an emergency. Do not wait to see if the symptoms will go away. Get medical help right away. Call your local emergency services (911 in the [place] Do not drive yourself to the hospital. Summary Acute bronchitis is inflammation of the main airways (bronchi) that come off the windpipe (trachea) in the lungs. The swelling causes the airways to get smaller and make more mucus than normal. Drinking more fluids can help thin your mucus so it is easier to cough up. Take over-the-counter and prescription medicines only as told by your health care provider. Do not use any products that contain nicotine or tobacco. These products include cigarettes, chewing tobacco, and vaping devices, such as e-cigarettes. If you need help quitting, ask your health care provider. Contact a health care provider if your symptoms do not improve after 2 weeks. This information is not intended to replace advice given to you by your health care provider. Make sure you discuss any questions you have with your health care provider. Document Revised: 10/07/2021 Document Reviewed: 10/28/2020 Elsevier Patient Education  2024 Elsevier Inc.    If you have been instructed to have an in-person evaluation today at a local Urgent Care facility, please use the link below. It will take you to a list of all of our available [facility], including address, phone number and hours of operation. Please do not delay care.  [facility]  If you or a family member do not have a primary care provider, use the link below to schedule a visit and establish care. When you choose a [facility] primary care physician or advanced practice provider, you gain a long-term partner  in health. Find a Primary Care Provider  Learn more about [facility]'s in-office and virtual care options:  - Get Care Now

## 2023-07-14 ENCOUNTER — Ambulatory Visit: Payer: Medicare HMO | Admitting: Nurse Practitioner

## 2023-08-09 DIAGNOSIS — D225 Melanocytic nevi of trunk: Secondary | ICD-10-CM | POA: Diagnosis not present

## 2023-08-09 DIAGNOSIS — L821 Other seborrheic keratosis: Secondary | ICD-10-CM | POA: Diagnosis not present

## 2023-08-09 DIAGNOSIS — L570 Actinic keratosis: Secondary | ICD-10-CM | POA: Diagnosis not present

## 2023-08-09 DIAGNOSIS — D485 Neoplasm of uncertain behavior of skin: Secondary | ICD-10-CM | POA: Diagnosis not present

## 2023-08-28 ENCOUNTER — Inpatient Hospital Stay: Payer: Medicare HMO | Attending: Physician Assistant

## 2023-08-28 ENCOUNTER — Encounter: Payer: Self-pay | Admitting: Physician Assistant

## 2023-08-28 DIAGNOSIS — D500 Iron deficiency anemia secondary to blood loss (chronic): Secondary | ICD-10-CM | POA: Diagnosis not present

## 2023-08-28 LAB — CBC WITH DIFFERENTIAL (CANCER CENTER ONLY)
Abs Immature Granulocytes: 0.02 10*3/uL (ref 0.00–0.07)
Basophils Absolute: 0.1 10*3/uL (ref 0.0–0.1)
Basophils Relative: 1 %
Eosinophils Absolute: 0.4 10*3/uL (ref 0.0–0.5)
Eosinophils Relative: 5 %
HCT: 32.1 % — ABNORMAL LOW (ref 39.0–52.0)
Hemoglobin: 10.2 g/dL — ABNORMAL LOW (ref 13.0–17.0)
Immature Granulocytes: 0 %
Lymphocytes Relative: 23 %
Lymphs Abs: 1.5 10*3/uL (ref 0.7–4.0)
MCH: 29.5 pg (ref 26.0–34.0)
MCHC: 31.8 g/dL (ref 30.0–36.0)
MCV: 92.8 fL (ref 80.0–100.0)
Monocytes Absolute: 0.8 10*3/uL (ref 0.1–1.0)
Monocytes Relative: 12 %
Neutro Abs: 3.8 10*3/uL (ref 1.7–7.7)
Neutrophils Relative %: 59 %
Platelet Count: 259 10*3/uL (ref 150–400)
RBC: 3.46 MIL/uL — ABNORMAL LOW (ref 4.22–5.81)
RDW: 15.9 % — ABNORMAL HIGH (ref 11.5–15.5)
WBC Count: 6.5 10*3/uL (ref 4.0–10.5)
nRBC: 0 % (ref 0.0–0.2)

## 2023-08-28 LAB — IRON AND IRON BINDING CAPACITY (CC-WL,HP ONLY)
Iron: 335 ug/dL — ABNORMAL HIGH (ref 45–182)
Saturation Ratios: 91 % — ABNORMAL HIGH (ref 17.9–39.5)
TIBC: 367 ug/dL (ref 250–450)
UIBC: 32 ug/dL — ABNORMAL LOW (ref 117–376)

## 2023-08-28 LAB — FERRITIN: Ferritin: 43 ng/mL (ref 24–336)

## 2023-09-20 DIAGNOSIS — D0359 Melanoma in situ of other part of trunk: Secondary | ICD-10-CM | POA: Diagnosis not present

## 2023-10-04 DIAGNOSIS — C44519 Basal cell carcinoma of skin of other part of trunk: Secondary | ICD-10-CM | POA: Diagnosis not present

## 2023-10-18 DIAGNOSIS — D045 Carcinoma in situ of skin of trunk: Secondary | ICD-10-CM | POA: Diagnosis not present

## 2023-10-18 DIAGNOSIS — D0461 Carcinoma in situ of skin of right upper limb, including shoulder: Secondary | ICD-10-CM | POA: Diagnosis not present

## 2023-11-08 ENCOUNTER — Telehealth: Payer: Self-pay | Admitting: Physician Assistant

## 2023-11-28 ENCOUNTER — Other Ambulatory Visit: Payer: Medicare HMO

## 2023-11-28 ENCOUNTER — Ambulatory Visit: Payer: Medicare HMO | Admitting: Physician Assistant

## 2023-11-28 ENCOUNTER — Other Ambulatory Visit: Payer: Self-pay | Admitting: Physician Assistant

## 2023-11-28 DIAGNOSIS — D500 Iron deficiency anemia secondary to blood loss (chronic): Secondary | ICD-10-CM

## 2023-11-29 ENCOUNTER — Inpatient Hospital Stay (HOSPITAL_BASED_OUTPATIENT_CLINIC_OR_DEPARTMENT_OTHER): Admitting: Physician Assistant

## 2023-11-29 ENCOUNTER — Inpatient Hospital Stay: Attending: Physician Assistant

## 2023-11-29 VITALS — BP 163/92 | HR 73 | Temp 97.9°F | Resp 18 | Wt 180.2 lb

## 2023-11-29 DIAGNOSIS — R600 Localized edema: Secondary | ICD-10-CM | POA: Insufficient documentation

## 2023-11-29 DIAGNOSIS — K922 Gastrointestinal hemorrhage, unspecified: Secondary | ICD-10-CM | POA: Diagnosis not present

## 2023-11-29 DIAGNOSIS — R0609 Other forms of dyspnea: Secondary | ICD-10-CM | POA: Diagnosis not present

## 2023-11-29 DIAGNOSIS — D500 Iron deficiency anemia secondary to blood loss (chronic): Secondary | ICD-10-CM

## 2023-11-29 DIAGNOSIS — Z87891 Personal history of nicotine dependence: Secondary | ICD-10-CM | POA: Insufficient documentation

## 2023-11-29 DIAGNOSIS — Z800 Family history of malignant neoplasm of digestive organs: Secondary | ICD-10-CM | POA: Insufficient documentation

## 2023-11-29 LAB — IRON AND IRON BINDING CAPACITY (CC-WL,HP ONLY)
Iron: 374 ug/dL — ABNORMAL HIGH (ref 45–182)
Saturation Ratios: 92 % — ABNORMAL HIGH (ref 17.9–39.5)
TIBC: 409 ug/dL (ref 250–450)
UIBC: 35 ug/dL — ABNORMAL LOW (ref 117–376)

## 2023-11-29 LAB — CBC WITH DIFFERENTIAL (CANCER CENTER ONLY)
Abs Immature Granulocytes: 0.03 10*3/uL (ref 0.00–0.07)
Basophils Absolute: 0.1 10*3/uL (ref 0.0–0.1)
Basophils Relative: 1 %
Eosinophils Absolute: 0.3 10*3/uL (ref 0.0–0.5)
Eosinophils Relative: 3 %
HCT: 35.4 % — ABNORMAL LOW (ref 39.0–52.0)
Hemoglobin: 11.3 g/dL — ABNORMAL LOW (ref 13.0–17.0)
Immature Granulocytes: 0 %
Lymphocytes Relative: 19 %
Lymphs Abs: 1.7 10*3/uL (ref 0.7–4.0)
MCH: 29.3 pg (ref 26.0–34.0)
MCHC: 31.9 g/dL (ref 30.0–36.0)
MCV: 91.7 fL (ref 80.0–100.0)
Monocytes Absolute: 0.9 10*3/uL (ref 0.1–1.0)
Monocytes Relative: 10 %
Neutro Abs: 5.7 10*3/uL (ref 1.7–7.7)
Neutrophils Relative %: 67 %
Platelet Count: 256 10*3/uL (ref 150–400)
RBC: 3.86 MIL/uL — ABNORMAL LOW (ref 4.22–5.81)
RDW: 13.5 % (ref 11.5–15.5)
WBC Count: 8.6 10*3/uL (ref 4.0–10.5)
nRBC: 0 % (ref 0.0–0.2)

## 2023-11-29 LAB — FERRITIN: Ferritin: 47 ng/mL (ref 24–336)

## 2023-11-30 NOTE — Progress Notes (Unsigned)
 [facility] [phone]   [phone]  PROGRESS NOTE  Patient Care Team: Judithann Novas, MD as PCP - General (Family Medicine) Odie Benne, MD as PCP - Cardiology (Cardiology) Albert Huff, MD as Consulting Physician (Ophthalmology) Alta Ast, [facility] (Inactive) as Pharmacist (Pharmacist)   CHIEF COMPLAINTS/PURPOSE OF CONSULTATION:  Iron  deficiency anemia 2/2 GI bleeding  PRIOR TREATMENT: 11/04/2022-12/02/2022: Received IV venofer  200 mg x 5 doses 01/18/2023-02/14/2023: Received IV venofer  200 mg x 5 doses  INTERVAL HISTORY:  [name] 89 y.o. male returns for a follow up for iron  deficiency anemia. He was last seen on 06/04/2023. In the interim, he has had no major changes in his health.  On exam today, Mr. Patti reports he has been well overall in the interim since her last visit.  He reports his energy levels are fairly stable.  He has noticed increased dyspnea on exertion and lower extremity edema.   He denies nausea, vomiting or bowel habit changes.  He denies easy bruising or signs of active bleeding such as hematochezia or melena.  He denies fevers, chills, sweats, chest pain or cough. He has no other complaints. Rest of the 10 point ROS is below.   MEDICAL HISTORY:  Past Medical History:  Diagnosis Date   CAD (coronary artery disease)    s/p stent mid LAD 2005 // s/p CABG 2016 // Myoview  8/19:  EF 64, no ischemia   Coronary artery disease involving native coronary artery of native heart without angina pectoris    Depression    DJD (degenerative joint disease)    DM type 2 with diabetic peripheral neuropathy (HCC) 03/10/2017   E coli bacteremia 04/20/2016   Essential hypertension 04/20/2016   GERD (gastroesophageal reflux disease)    History of echocardiogram    Echo 8/19: Moderate LVH, EF 60-65, normal wall motion, grade 1 diastolic dysfunction, mild MR, mild LAE, normal RVSF, mild TR, PASP 25   History of hiatal hernia     HTN (hypertension)    Hyperlipidemia    Neuropathy due to type 2 diabetes mellitus (HCC) 03/10/2017   PERCUTANEOUS TRANSLUMINAL CORONARY ANGIOPLASTY, HX OF 11/26/2007   Annotation: With CYPHER STENT. Qualifier: Diagnosis of  By: Sharlette Dayhoff CMA, [place]     Pre-diabetes    S/P CABG x 4 11/03/2014   S/P total knee replacement 04/11/2016   Sepsis due to Escherichia coli (E. coli) (HCC) 04/20/2016   Tinea pedis 09/08/2017    SURGICAL HISTORY: Past Surgical History:  Procedure Laterality Date   BRAIN SURGERY     CARDIAC CATHETERIZATION     COLONOSCOPY     CORONARY ARTERY BYPASS GRAFT N/A 11/03/2014   Procedure: CORONARY ARTERY BYPASS GRAFTING (CABG), ON PUMP, TIMES FOUR, USING LEFT INTERNAL MAMMARY, RIGHT GREATER SAPHENOUS VEIN HARVESTED ENDOSCOPICALLY;  Surgeon: Heriberto London, MD;  Location: [facility] OR;  Service: Open Heart Surgery;  Laterality: N/A;  LIMA-LAD; SVG-DIAG; SVG-OM; SVG-RCA   CRANIOTOMY N/A 01/18/2014   Procedure: CRANIOTOMY HEMATOMA EVACUATION SUBDURAL;  Surgeon: Corrina Dimitri, MD;  Location[facility] OR;  Service: Neurosurgery;  Laterality: N/A;   kidney stone removal     KNEE SURGERY     LEFT HEART CATHETERIZATION WITH CORONARY ANGIOGRAM N/A 07/27/2011   Procedure: LEFT HEART CATHETERIZATION WITH CORONARY ANGIOGRAM;  Surgeon: Odie Benne, MD;  Location: [facility] CATH LAB;  Service: Cardiovascular;  Laterality: N/A;   LEFT HEART CATHETERIZATION WITH CORONARY ANGIOGRAM N/A 10/31/2014   Procedure: LEFT HEART CATHETERIZATION WITH CORONARY ANGIOGRAM;  Surgeon: Arnoldo Lapping, MD;  Location: [facility] CATH LAB;  Service: Cardiovascular;  Laterality: N/A;   PTCA     Hx of it.    SIGMOIDOSCOPY     TEE WITHOUT CARDIOVERSION N/A 11/03/2014   Procedure: TRANSESOPHAGEAL ECHOCARDIOGRAM (TEE);  Surgeon: Heriberto London, MD;  Location[facility] OR;  Service: Open Heart Surgery;  Laterality: N/A;   TOTAL KNEE ARTHROPLASTY  02/09/2011   Right knee   TOTAL KNEE ARTHROPLASTY Left 04/11/2016   Procedure:  LEFT TOTAL KNEE ARTHROPLASTY;  Surgeon: Christie Cox, MD;  Location: [facility] OR;  Service: Orthopedics;  Laterality: Left;   UPPER GASTROINTESTINAL ENDOSCOPY      SOCIAL HISTORY: Social History   Socioeconomic History   Marital status: Married    Spouse name: Not on file   Number of children: 3   Years of education: Not on file   Highest education level: Not on file  Occupational History   Occupation: retired  Tobacco Use   Smoking status: Former    Current packs/day: 0.00    Types: Cigarettes    Quit date: 1985    Years since quitting: 40.4   Smokeless tobacco: Former    Types: Chew    Quit date: 1985   Tobacco comments:    quit in 1985. smoked 1ppd for 35 years   Vaping Use   Vaping status: Never Used  Substance and Sexual Activity   Alcohol use: No   Drug use: No   Sexual activity: Never  Other Topics Concern   Not on file  Social History Narrative   Married with children. 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 [name] 05/18/10. 10:20 am    Has living will,  HCPOA: jerrie Pe, full code (reviewed 67)   Social Drivers of Health   Financial Resource Strain: Low Risk  (06/14/2023)   Overall Financial Resource Strain (CARDIA)    Difficulty of Paying Living Expenses: Not hard at all  Food Insecurity: No Food Insecurity (06/14/2023)   Hunger Vital Sign    Worried About Running Out of Food in the Last Year: Never true    Ran Out of Food in the Last Year: Never true  Transportation Needs: No Transportation Needs (06/14/2023)   PRAPARE - Administrator, Civil Service (Medical): No    Lack of Transportation (Non-Medical): No  Physical Activity: Inactive (06/14/2023)   Exercise Vital Sign    Days of Exercise per Week: 0 days    Minutes of Exercise per Session: 0 min  Stress: No Stress Concern Present (06/14/2023)   [employer] of Occupational Health - Occupational Stress Questionnaire    Feeling of Stress : Not at all  Social  Connections: Socially Integrated (06/14/2023)   Social Connection and Isolation Panel [NHANES]    Frequency of Communication with Friends and Family: More than three times a week    Frequency of Social Gatherings with Friends and Family: Three times a week    Attends Religious Services: More than 4 times per year    Active Member of Clubs or Organizations: Yes    Attends Banker Meetings: More than 4 times per year    Marital Status: Married  Catering manager Violence: Not At Risk (06/14/2023)   Humiliation, Afraid, Rape, and Kick questionnaire    Fear of Current or Ex-Partner: No    Emotionally Abused: No    Physically Abused: No    Sexually Abused: No    FAMILY HISTORY: Family History  Problem Relation Age of Onset   Heart failure Mother    COPD Mother    Heart disease Father    Heart attack Father    Heart disease Sister    Heart attack Sister    Heart attack Brother    Colon cancer Daughter 65       mets   Hypertension Son    Hypertension Son     ALLERGIES:  is allergic to protamine , statins, adhesive [tape], imodium  [loperamide ], and rosuvastatin  calcium .  MEDICATIONS:  Current Outpatient Medications  Medication Sig Dispense Refill   acetaminophen  (TYLENOL ) 500 MG tablet Take 500-1,000 mg by mouth 2 (two) times daily as needed (for pain).      aspirin  EC 81 MG tablet Take 81 mg by mouth daily. Swallow whole.     cholecalciferol  (VITAMIN D3) 25 MCG (1000 UNIT) tablet Take 1,000 Units by mouth daily.     FEROSUL 325 (65 Fe) MG tablet Take 1 tablet (325 mg total) by mouth daily with breakfast. 90 tablet 2   furosemide  (LASIX ) 20 MG tablet Take 1 tablet by mouth once daily 30 tablet 3   glucose blood (ONETOUCH ULTRA) test strip USE 1 STRIP TO CHECK GLUCOSE ONCE DAILY 100 each 3   isosorbide  mononitrate (IMDUR ) 30 MG 24 hr tablet Take 1 tablet by mouth once daily 90 tablet 3   nitroGLYCERIN  (NITROSTAT ) 0.4 MG SL tablet DISSOLVE ONE TABLET UNDER THE TONGUE EVERY  5 MINUTES AS NEEDED FOR CHEST PAIN.  DO NOT EXCEED A TOTAL OF 3 DOSES IN 15 MINUTES 25 tablet 2   valsartan  (DIOVAN ) 40 MG tablet Take 0.5 tablets (20 mg total) by mouth daily. (Patient taking differently: Take 20 mg by mouth daily. Only takes as needed) 45 tablet 3   vitamin B-12 (CYANOCOBALAMIN ) 1000 MCG tablet Take 1,000 mcg by mouth daily.     albuterol  (VENTOLIN  HFA) 108 (90 Base) MCG/ACT inhaler Inhale 1-2 puffs into the lungs every 6 (six) hours as needed. 8 g 0   benzonatate  (TESSALON ) 100 MG capsule Take 1 capsule (100 mg total) by mouth 3 (three) times daily as needed. 30 capsule 0   fluticasone  (FLONASE ) 50 MCG/ACT nasal spray Place 2 sprays into both nostrils daily. 16 g 6   rosuvastatin  (CRESTOR ) 5 MG tablet Take 1 tablet (5 mg total) by mouth every Monday, Wednesday, and Friday. 40 tablet 3   Spacer/Aero-Holding Chambers DEVI Use with inhaler 1 each 0   No current facility-administered medications for this visit.    REVIEW OF SYSTEMS:   Constitutional: ( - ) fevers, ( - )  chills , ( - ) night sweats Eyes: ( - ) blurriness of vision, ( - ) double vision, ( - ) watery eyes Ears, nose, mouth, throat, and face: ( - ) mucositis, ( - ) sore throat Respiratory: ( - ) cough, ( + ) dyspnea, ( - ) wheezes Cardiovascular: ( - ) palpitation, ( - ) chest discomfort, ( - ) lower extremity swelling Gastrointestinal:  ( - ) nausea, ( - ) heartburn, ( - ) change in bowel habits Skin: ( - ) abnormal skin rashes Lymphatics: ( - ) new lymphadenopathy, ( - ) easy bruising Neurological: ( - ) numbness, ( - ) tingling, ( - ) new weaknesses Behavioral/Psych: ( - ) mood change, ( - ) new changes  All other systems were reviewed with the patient and are negative.  PHYSICAL EXAMINATION: ECOG PERFORMANCE STATUS: 1 - Symptomatic but completely ambulatory  Vitals:  11/29/23 1407  BP: (!) 163/92  Pulse: 73  Resp: 18  Temp: 97.9 F (36.6 C)  SpO2: 96%    Filed Weights   11/29/23 1407  Weight:  180 lb 3.2 oz (81.7 kg)     GENERAL: elderly appearing male in NAD  SKIN: skin color, texture, turgor are normal, no rashes or significant lesions EYES: conjunctiva are pink and non-injected, sclera clear LUNGS: clear to auscultation and percussion with normal breathing effort HEART: regular rate & rhythm and no murmurs. Bilateral lower extremity edema Musculoskeletal: no cyanosis of digits and no clubbing  PSYCH: alert & oriented x 3, fluent speech NEURO: no focal motor/sensory deficits  LABORATORY DATA:  I have reviewed the data as listed    Latest Ref Rng & Units 11/29/2023    1:35 PM 08/28/2023   11:46 AM 06/01/2023   11:26 AM  CBC  WBC 4.0 - 10.5 K/uL 8.6  6.5  9.2   Hemoglobin 13.0 - 17.0 g/dL 16.1  09.6  04.5   Hematocrit 39.0 - 52.0 % 35.4  32.1  35.9   Platelets 150 - 400 K/uL 256  259  197        Latest Ref Rng & Units 11/02/2022    3:56 PM 10/21/2022   12:13 PM 08/22/2022    9:21 AM  CMP  Glucose 70 - 99 mg/dL 409  811  914   BUN 8 - 23 mg/dL 24  20  19    Creatinine 0.61 - 1.24 mg/dL 7.82  9.56  2.13   Sodium 135 - 145 mmol/L 139  138  141   Potassium 3.5 - 5.1 mmol/L 4.2  4.0  4.7   Chloride 98 - 111 mmol/L 107  108  107   CO2 22 - 32 mmol/L 23  22  22    Calcium  8.9 - 10.3 mg/dL 9.0  9.0  9.2   Total Protein 6.5 - 8.1 g/dL 6.3   6.4   Total Bilirubin 0.3 - 1.2 mg/dL 0.5   0.3   Alkaline Phos 38 - 126 U/L 49   68   AST 15 - 41 U/L 20   13   ALT 0 - 44 U/L 14   12     ASSESSMENT & PLAN ALISTAR MCENERY is a 89 y.o. male who returns for a follow up for iron  deficiency anemia.   #iron  deficiency anemia 2/2 GI bleeding: --Underwent EGD/colonoscopy on 11/07/2021 with Dr. Dominic Friendly. EGD showed large hiatal hernia with gastric erosions with stigmata of recent bleeding. Colonoscopy did not show any signs of bleeding.  --Last received IV venofer  200 mg x 5 doses from 01/18/2023-02/14/2023 --Labs today shows stable anemia with Hgb 11.3, MCV 91.7. Iron  panel shows no deficiency  with ferritin 47, saturation 92%, iron  374.  --No need for additional IV iron  at this time.  --Recommend to continue on ferrous sulfate  325 mg once daily with a source of vitamin C --encouraged to eat iron  rich foods.  --RTC in 3 months with labs only and 6 months with labs/follow up.  #Dyspnea on exertion #Lower extremity edema: --Patient plans to follow up with cardiologist for further evaluation.   No orders of the defined types were placed in this encounter.   All questions were answered. The patient knows to call the clinic with any problems, questions or concerns.  I have spent a total of 25 minutes minutes of face-to-face and non-face-to-face time, preparing to see the patient, performing a medically appropriate examination, counseling  and educating the patient, ordering medications, documenting clinical information in the electronic health record,  and care coordination.   Wyline Hearing PA-C Dept of Hematology and Oncology [facility] Phone: [phone]

## 2023-12-01 ENCOUNTER — Other Ambulatory Visit: Payer: Self-pay | Admitting: Nurse Practitioner

## 2023-12-01 ENCOUNTER — Other Ambulatory Visit: Payer: Self-pay | Admitting: Cardiovascular Disease

## 2023-12-03 ENCOUNTER — Encounter: Payer: Self-pay | Admitting: Physician Assistant

## 2023-12-06 ENCOUNTER — Ambulatory Visit: Payer: Self-pay

## 2023-12-06 NOTE — Telephone Encounter (Signed)
 LM with lab results there is no need for IV iron  at this time

## 2023-12-06 NOTE — Telephone Encounter (Signed)
-----   Message from Darilyn Edin sent at 12/05/2023  8:45 PM EDT ----- Please notify patient that iron  levels are stable without evidence of iron  deficiency. No need for IV iron  at this time. ----- Message ----- From: Interface, Lab In [place] Sent: 11/29/2023   1:50 PM EDT To: Darilyn Edin, PA-C

## 2024-02-07 DIAGNOSIS — Z85820 Personal history of malignant melanoma of skin: Secondary | ICD-10-CM | POA: Diagnosis not present

## 2024-02-07 DIAGNOSIS — C44629 Squamous cell carcinoma of skin of left upper limb, including shoulder: Secondary | ICD-10-CM | POA: Diagnosis not present

## 2024-02-07 DIAGNOSIS — D2272 Melanocytic nevi of left lower limb, including hip: Secondary | ICD-10-CM | POA: Diagnosis not present

## 2024-02-07 DIAGNOSIS — D485 Neoplasm of uncertain behavior of skin: Secondary | ICD-10-CM | POA: Diagnosis not present

## 2024-02-07 DIAGNOSIS — L570 Actinic keratosis: Secondary | ICD-10-CM | POA: Diagnosis not present

## 2024-02-07 DIAGNOSIS — D225 Melanocytic nevi of trunk: Secondary | ICD-10-CM | POA: Diagnosis not present

## 2024-02-07 DIAGNOSIS — Z85828 Personal history of other malignant neoplasm of skin: Secondary | ICD-10-CM | POA: Diagnosis not present

## 2024-02-07 DIAGNOSIS — D2262 Melanocytic nevi of left upper limb, including shoulder: Secondary | ICD-10-CM | POA: Diagnosis not present

## 2024-02-07 DIAGNOSIS — C44622 Squamous cell carcinoma of skin of right upper limb, including shoulder: Secondary | ICD-10-CM | POA: Diagnosis not present

## 2024-02-07 DIAGNOSIS — C44519 Basal cell carcinoma of skin of other part of trunk: Secondary | ICD-10-CM | POA: Diagnosis not present

## 2024-02-07 DIAGNOSIS — C44311 Basal cell carcinoma of skin of nose: Secondary | ICD-10-CM | POA: Diagnosis not present

## 2024-02-07 DIAGNOSIS — D2261 Melanocytic nevi of right upper limb, including shoulder: Secondary | ICD-10-CM | POA: Diagnosis not present

## 2024-02-14 DIAGNOSIS — I4891 Unspecified atrial fibrillation: Secondary | ICD-10-CM | POA: Diagnosis not present

## 2024-02-14 DIAGNOSIS — G629 Polyneuropathy, unspecified: Secondary | ICD-10-CM | POA: Diagnosis not present

## 2024-02-14 DIAGNOSIS — D6869 Other thrombophilia: Secondary | ICD-10-CM | POA: Diagnosis not present

## 2024-02-14 DIAGNOSIS — Z8249 Family history of ischemic heart disease and other diseases of the circulatory system: Secondary | ICD-10-CM | POA: Diagnosis not present

## 2024-02-14 DIAGNOSIS — Z008 Encounter for other general examination: Secondary | ICD-10-CM | POA: Diagnosis not present

## 2024-02-14 DIAGNOSIS — R269 Unspecified abnormalities of gait and mobility: Secondary | ICD-10-CM | POA: Diagnosis not present

## 2024-02-14 DIAGNOSIS — Z809 Family history of malignant neoplasm, unspecified: Secondary | ICD-10-CM | POA: Diagnosis not present

## 2024-02-14 DIAGNOSIS — Z87891 Personal history of nicotine dependence: Secondary | ICD-10-CM | POA: Diagnosis not present

## 2024-02-14 DIAGNOSIS — Z9181 History of falling: Secondary | ICD-10-CM | POA: Diagnosis not present

## 2024-02-14 DIAGNOSIS — I25119 Atherosclerotic heart disease of native coronary artery with unspecified angina pectoris: Secondary | ICD-10-CM | POA: Diagnosis not present

## 2024-02-14 DIAGNOSIS — I110 Hypertensive heart disease with heart failure: Secondary | ICD-10-CM | POA: Diagnosis not present

## 2024-02-14 DIAGNOSIS — M1990 Unspecified osteoarthritis, unspecified site: Secondary | ICD-10-CM | POA: Diagnosis not present

## 2024-02-14 DIAGNOSIS — I509 Heart failure, unspecified: Secondary | ICD-10-CM | POA: Diagnosis not present

## 2024-02-26 ENCOUNTER — Other Ambulatory Visit: Payer: Self-pay | Admitting: Nurse Practitioner

## 2024-02-28 ENCOUNTER — Inpatient Hospital Stay: Attending: Hematology and Oncology

## 2024-03-15 ENCOUNTER — Telehealth: Payer: Self-pay | Admitting: Hematology and Oncology

## 2024-03-19 ENCOUNTER — Inpatient Hospital Stay: Attending: Hematology and Oncology

## 2024-03-19 DIAGNOSIS — K922 Gastrointestinal hemorrhage, unspecified: Secondary | ICD-10-CM | POA: Diagnosis not present

## 2024-03-19 DIAGNOSIS — D500 Iron deficiency anemia secondary to blood loss (chronic): Secondary | ICD-10-CM | POA: Insufficient documentation

## 2024-03-19 LAB — IRON AND IRON BINDING CAPACITY (CC-WL,HP ONLY)
Iron: 19 ug/dL — ABNORMAL LOW (ref 45–182)
Saturation Ratios: 4 % — ABNORMAL LOW (ref 17.9–39.5)
TIBC: 473 ug/dL — ABNORMAL HIGH (ref 250–450)
UIBC: 454 ug/dL — ABNORMAL HIGH (ref 117–376)

## 2024-03-19 LAB — CMP (CANCER CENTER ONLY)
ALT: 10 U/L (ref 0–44)
AST: 12 U/L — ABNORMAL LOW (ref 15–41)
Albumin: 4.1 g/dL (ref 3.5–5.0)
Alkaline Phosphatase: 79 U/L (ref 38–126)
Anion gap: 6 (ref 5–15)
BUN: 23 mg/dL (ref 8–23)
CO2: 27 mmol/L (ref 22–32)
Calcium: 9.1 mg/dL (ref 8.9–10.3)
Chloride: 109 mmol/L (ref 98–111)
Creatinine: 0.79 mg/dL (ref 0.61–1.24)
GFR, Estimated: >60 mL/min (ref >60)
Glucose, Bld: 106 mg/dL — ABNORMAL HIGH (ref 70–99)
Potassium: 4 mmol/L (ref 3.5–5.1)
Sodium: 142 mmol/L (ref 135–145)
Total Bilirubin: 0.4 mg/dL (ref 0.0–1.2)
Total Protein: 6.6 g/dL (ref 6.5–8.1)

## 2024-03-19 LAB — CBC WITH DIFFERENTIAL (CANCER CENTER ONLY)
Abs Immature Granulocytes: 0.03 K/uL (ref 0.00–0.07)
Basophils Absolute: 0.1 K/uL (ref 0.0–0.1)
Basophils Relative: 1 %
Eosinophils Absolute: 0.3 K/uL (ref 0.0–0.5)
Eosinophils Relative: 4 %
HCT: 30.5 % — ABNORMAL LOW (ref 39.0–52.0)
Hemoglobin: 9.3 g/dL — ABNORMAL LOW (ref 13.0–17.0)
Immature Granulocytes: 0 %
Lymphocytes Relative: 19 %
Lymphs Abs: 1.5 K/uL (ref 0.7–4.0)
MCH: 26.3 pg (ref 26.0–34.0)
MCHC: 30.5 g/dL (ref 30.0–36.0)
MCV: 86.4 fL (ref 80.0–100.0)
Monocytes Absolute: 0.9 K/uL (ref 0.1–1.0)
Monocytes Relative: 12 %
Neutro Abs: 5.2 K/uL (ref 1.7–7.7)
Neutrophils Relative %: 64 %
Platelet Count: 317 K/uL (ref 150–400)
RBC: 3.53 MIL/uL — ABNORMAL LOW (ref 4.22–5.81)
RDW: 14.7 % (ref 11.5–15.5)
WBC Count: 8 K/uL (ref 4.0–10.5)
nRBC: 0 % (ref 0.0–0.2)

## 2024-03-19 LAB — FERRITIN: Ferritin: 28 ng/mL (ref 24–336)

## 2024-03-21 ENCOUNTER — Other Ambulatory Visit: Payer: Self-pay | Admitting: Physician Assistant

## 2024-03-21 ENCOUNTER — Ambulatory Visit: Payer: Self-pay | Admitting: Physician Assistant

## 2024-03-22 ENCOUNTER — Telehealth: Payer: Self-pay

## 2024-03-22 NOTE — Telephone Encounter (Signed)
 [name], patient will be scheduled as soon as possible.  Auth Submission: NO AUTH NEEDED Site of care: Site of care: [facility] Payer: [employer] medicare Medication & CPT/J Code(s) submitted: Venofer  (Iron  Sucrose) J1756 Diagnosis Code:  Route of submission (phone, fax, portal):  Phone # Fax # Auth type: Buy/Bill PB Units/visits requested: 200mg  x 5 doses Reference number:  Approval from: 03/22/24 to 06/21/24

## 2024-03-27 DIAGNOSIS — C44622 Squamous cell carcinoma of skin of right upper limb, including shoulder: Secondary | ICD-10-CM | POA: Diagnosis not present

## 2024-04-02 ENCOUNTER — Ambulatory Visit (INDEPENDENT_AMBULATORY_CARE_PROVIDER_SITE_OTHER)

## 2024-04-02 VITALS — BP 116/67 | HR 68 | Temp 98.1°F | Resp 20 | Ht 67.0 in | Wt 181.1 lb

## 2024-04-02 DIAGNOSIS — D500 Iron deficiency anemia secondary to blood loss (chronic): Secondary | ICD-10-CM

## 2024-04-02 DIAGNOSIS — K922 Gastrointestinal hemorrhage, unspecified: Secondary | ICD-10-CM

## 2024-04-02 MED ORDER — IRON SUCROSE 20 MG/ML IV SOLN
200.0000 mg | Freq: Once | INTRAVENOUS | Status: AC
  Administered 2024-04-02: 200 mg via INTRAVENOUS
  Filled 2024-04-02: qty 10

## 2024-04-02 NOTE — Progress Notes (Signed)
 Diagnosis: Iron Deficiency Anemia  Provider:  Chilton Greathouse MD  Procedure: IV Push  IV Type: Peripheral, IV Location: L Antecubital  Venofer (Iron Sucrose), Dose: 200 mg  Post Infusion IV Care: Observation period completed and Peripheral IV Discontinued  Discharge: Condition: Good, Destination: Home . AVS Declined  Performed by:  Marlow Baars [place]king[place]-Burchett, RN

## 2024-04-09 ENCOUNTER — Ambulatory Visit (INDEPENDENT_AMBULATORY_CARE_PROVIDER_SITE_OTHER)

## 2024-04-09 VITALS — BP 163/82 | HR 61 | Temp 97.7°F | Resp 18 | Ht 67.0 in | Wt 181.0 lb

## 2024-04-09 DIAGNOSIS — K922 Gastrointestinal hemorrhage, unspecified: Secondary | ICD-10-CM | POA: Diagnosis not present

## 2024-04-09 DIAGNOSIS — D500 Iron deficiency anemia secondary to blood loss (chronic): Secondary | ICD-10-CM | POA: Diagnosis not present

## 2024-04-09 MED ORDER — IRON SUCROSE 20 MG/ML IV SOLN
200.0000 mg | Freq: Once | INTRAVENOUS | Status: AC
  Administered 2024-04-09: 200 mg via INTRAVENOUS
  Filled 2024-04-09: qty 10

## 2024-04-09 NOTE — Progress Notes (Signed)
 Diagnosis: Iron  Deficiency Anemia  Provider:  Praveen Mannam MD  Procedure: IV Push  IV Type: Peripheral, IV Location: L Antecubital  Venofer  (Iron  Sucrose), Dose: 200 mg  Post Infusion IV Care: Observation period completed and Peripheral IV Discontinued  Discharge: Condition: Good, Destination: Home . AVS Declined  Performed by:  Maximiano JONELLE Pouch, LPN

## 2024-04-10 DIAGNOSIS — C44629 Squamous cell carcinoma of skin of left upper limb, including shoulder: Secondary | ICD-10-CM | POA: Diagnosis not present

## 2024-04-10 DIAGNOSIS — D0462 Carcinoma in situ of skin of left upper limb, including shoulder: Secondary | ICD-10-CM | POA: Diagnosis not present

## 2024-04-16 ENCOUNTER — Ambulatory Visit

## 2024-04-16 VITALS — BP 133/71 | HR 74 | Temp 97.9°F | Resp 16 | Ht 67.0 in | Wt 180.4 lb

## 2024-04-16 DIAGNOSIS — D500 Iron deficiency anemia secondary to blood loss (chronic): Secondary | ICD-10-CM | POA: Diagnosis not present

## 2024-04-16 DIAGNOSIS — K922 Gastrointestinal hemorrhage, unspecified: Secondary | ICD-10-CM | POA: Diagnosis not present

## 2024-04-16 MED ORDER — IRON SUCROSE 20 MG/ML IV SOLN
200.0000 mg | Freq: Once | INTRAVENOUS | Status: AC
  Administered 2024-04-16: 200 mg via INTRAVENOUS
  Filled 2024-04-16: qty 10

## 2024-04-16 NOTE — Progress Notes (Signed)
 Diagnosis: Iron  Deficiency Anemia  Provider:  Praveen Mannam MD  Procedure: IV Push  IV Type: Peripheral, IV Location: L Forearm  Venofer  (Iron  Sucrose), Dose: 200 mg  Post Infusion IV Care: Observation period completed Patient requested 15 min obs  Discharge: Condition: Good, Destination: Home . AVS Declined  Performed by:  Eleanor DELENA Bloch, RN

## 2024-04-17 DIAGNOSIS — C44311 Basal cell carcinoma of skin of nose: Secondary | ICD-10-CM | POA: Diagnosis not present

## 2024-04-23 ENCOUNTER — Ambulatory Visit (INDEPENDENT_AMBULATORY_CARE_PROVIDER_SITE_OTHER)

## 2024-04-23 VITALS — BP 125/71 | HR 70 | Temp 97.7°F | Resp 20 | Ht 67.0 in | Wt 182.8 lb

## 2024-04-23 DIAGNOSIS — K922 Gastrointestinal hemorrhage, unspecified: Secondary | ICD-10-CM

## 2024-04-23 DIAGNOSIS — D500 Iron deficiency anemia secondary to blood loss (chronic): Secondary | ICD-10-CM | POA: Diagnosis not present

## 2024-04-23 MED ORDER — IRON SUCROSE 20 MG/ML IV SOLN
200.0000 mg | Freq: Once | INTRAVENOUS | Status: AC
  Administered 2024-04-23: 200 mg via INTRAVENOUS
  Filled 2024-04-23: qty 10

## 2024-04-23 NOTE — Progress Notes (Signed)
 Diagnosis: Iron  Deficiency Anemia  Provider:  Praveen Mannam MD  Procedure: IV Push  IV Type: Peripheral, IV Location: L Hand  Venofer  (Iron  Sucrose), Dose: 200 mg  Post Infusion IV Care: Observation period completed, Peripheral IV Discontinued, and stayed for 15 minutes of observation.  Discharge: Condition: Good, Destination: Home . AVS Declined  Performed by:  Waddell LOISE Freshwater, RN

## 2024-04-24 DIAGNOSIS — C44519 Basal cell carcinoma of skin of other part of trunk: Secondary | ICD-10-CM | POA: Diagnosis not present

## 2024-04-30 ENCOUNTER — Ambulatory Visit (INDEPENDENT_AMBULATORY_CARE_PROVIDER_SITE_OTHER)

## 2024-04-30 VITALS — BP 176/77 | HR 80 | Temp 98.0°F | Resp 18 | Ht 67.0 in | Wt 184.2 lb

## 2024-04-30 DIAGNOSIS — D500 Iron deficiency anemia secondary to blood loss (chronic): Secondary | ICD-10-CM

## 2024-04-30 MED ORDER — IRON SUCROSE 20 MG/ML IV SOLN
200.0000 mg | Freq: Once | INTRAVENOUS | Status: AC
  Administered 2024-04-30: 200 mg via INTRAVENOUS
  Filled 2024-04-30: qty 10

## 2024-04-30 NOTE — Progress Notes (Signed)
 Diagnosis: Iron Deficiency Anemia  Provider:  Chilton Greathouse MD  Procedure: IV Push  IV Type: Peripheral, IV Location: L Antecubital  Venofer (Iron Sucrose), Dose: 200 mg  Post Infusion IV Care: Observation period completed and Peripheral IV Discontinued  Discharge: Condition: Good, Destination: Home . AVS Declined  Performed by:  Marlow Baars [place]king[place]-Burchett, RN

## 2024-05-06 DIAGNOSIS — H5203 Hypermetropia, bilateral: Secondary | ICD-10-CM | POA: Diagnosis not present

## 2024-05-06 DIAGNOSIS — Z9841 Cataract extraction status, right eye: Secondary | ICD-10-CM | POA: Diagnosis not present

## 2024-05-06 DIAGNOSIS — H0288B Meibomian gland dysfunction left eye, upper and lower eyelids: Secondary | ICD-10-CM | POA: Diagnosis not present

## 2024-05-06 DIAGNOSIS — H52223 Regular astigmatism, bilateral: Secondary | ICD-10-CM | POA: Diagnosis not present

## 2024-05-06 DIAGNOSIS — H04123 Dry eye syndrome of bilateral lacrimal glands: Secondary | ICD-10-CM | POA: Diagnosis not present

## 2024-05-06 DIAGNOSIS — Z9842 Cataract extraction status, left eye: Secondary | ICD-10-CM | POA: Diagnosis not present

## 2024-05-14 ENCOUNTER — Other Ambulatory Visit: Payer: Self-pay | Admitting: Hematology and Oncology

## 2024-05-28 ENCOUNTER — Other Ambulatory Visit: Payer: Self-pay

## 2024-05-28 DIAGNOSIS — D500 Iron deficiency anemia secondary to blood loss (chronic): Secondary | ICD-10-CM

## 2024-05-28 NOTE — Progress Notes (Signed)
 [facility] [phone]   [phone]  PROGRESS NOTE  Patient Care Team: Avelina Greig BRAVO, MD as PCP - General (Family Medicine) Verlin Lonni BIRCH, MD as PCP - Cardiology (Cardiology) Roz Anes, MD as Consulting Physician (Ophthalmology)   CHIEF COMPLAINTS/PURPOSE OF CONSULTATION:  Iron  deficiency anemia 2/2 GI bleeding  PRIOR TREATMENT: 11/04/2022-12/02/2022: Received IV venofer  200 mg x 5 doses 01/18/2023-02/14/2023: Received IV venofer  200 mg x 5 doses 04/02/2024-04/30/2024: Received IV venofer  200 mg x 5 doses  INTERVAL HISTORY:  [name] 89 y.o. male returns for a follow up for iron  deficiency anemia. He was last seen on 11/29/2023. In the interim, he has had no major changes in his health and received 5 doses of IV Venofer  in September to October 2025.  On exam today, [name] reports he has been well overall in interim since our last visit.  He reports that he is a little bit dizzy and his energy levels are low.  He reports his energy today is about a 3 out of 10.  He notes after receiving his last IV iron  he could not tell the difference.  He notes he does have some shortness of breath but no chest pain.  He reports that he is taking his baby aspirin  daily.  He notes that he is not seeing any overt signs of bleeding such as blood in the urine, blood in stool, or dark stools.  He reports that he feels like he is sleeping well and resting okay.  He said no recent infectious symptoms such as runny nose, sore throat, cough.  He denies any fevers, chills, sweats.  He has not received the flu shot and he does not want to.  Overall he feels well and has no additional questions concerns or complaints today.  Full 10 point ROS is otherwise negative  MEDICAL HISTORY:  Past Medical History:  Diagnosis Date   CAD (coronary artery disease)    s/p stent mid LAD 2005 // s/p CABG 2016 // Myoview  8/19:  EF 64, no ischemia   Coronary artery disease  involving native coronary artery of native heart without angina pectoris    Depression    DJD (degenerative joint disease)    DM type 2 with diabetic peripheral neuropathy (HCC) 03/10/2017   E coli bacteremia 04/20/2016   Essential hypertension 04/20/2016   GERD (gastroesophageal reflux disease)    History of echocardiogram    Echo 8/19: Moderate LVH, EF 60-65, normal wall motion, grade 1 diastolic dysfunction, mild MR, mild LAE, normal RVSF, mild TR, PASP 25   History of hiatal hernia    HTN (hypertension)    Hyperlipidemia    Neuropathy due to type 2 diabetes mellitus (HCC) 03/10/2017   PERCUTANEOUS TRANSLUMINAL CORONARY ANGIOPLASTY, HX OF 11/26/2007   Annotation: With CYPHER STENT. Qualifier: Diagnosis of  By: Nickola CMA, [place]     Pre-diabetes    S/P CABG x 4 11/03/2014   S/P total knee replacement 04/11/2016   Sepsis due to Escherichia coli (E. coli) (HCC) 04/20/2016   Tinea pedis 09/08/2017    SURGICAL HISTORY: Past Surgical History:  Procedure Laterality Date   BRAIN SURGERY     CARDIAC CATHETERIZATION     COLONOSCOPY     CORONARY ARTERY BYPASS GRAFT N/A 11/03/2014   Procedure: CORONARY ARTERY BYPASS GRAFTING (CABG), ON PUMP, TIMES FOUR, USING LEFT INTERNAL MAMMARY, RIGHT GREATER SAPHENOUS VEIN HARVESTED ENDOSCOPICALLY;  Surgeon: Maude Fleeta Ochoa, MD;  Location: [facility] OR;  Service: Open  Heart Surgery;  Laterality: N/A;  LIMA-LAD; SVG-DIAG; SVG-OM; SVG-RCA   CRANIOTOMY N/A 01/18/2014   Procedure: CRANIOTOMY HEMATOMA EVACUATION SUBDURAL;  Surgeon: Lamar LELON Peaches, MD;  Location[facility];  Service: Neurosurgery;  Laterality: N/A;   kidney stone removal     KNEE SURGERY     LEFT HEART CATHETERIZATION WITH CORONARY ANGIOGRAM N/A 07/27/2011   Procedure: LEFT HEART CATHETERIZATION WITH CORONARY ANGIOGRAM;  Surgeon: Lonni JONETTA Cash, MD;  Location: [facility] CATH LAB;  Service: Cardiovascular;  Laterality: N/A;   LEFT HEART CATHETERIZATION WITH CORONARY ANGIOGRAM N/A 10/31/2014   Procedure:  LEFT HEART CATHETERIZATION WITH CORONARY ANGIOGRAM;  Surgeon: Ozell Fell, MD;  Location: [facility] CATH LAB;  Service: Cardiovascular;  Laterality: N/A;   PTCA     Hx of it.    SIGMOIDOSCOPY     TEE WITHOUT CARDIOVERSION N/A 11/03/2014   Procedure: TRANSESOPHAGEAL ECHOCARDIOGRAM (TEE);  Surgeon: Maude Fleeta Ochoa, MD;  Location: [facility] OR;  Service: Open Heart Surgery;  Laterality: N/A;   TOTAL KNEE ARTHROPLASTY  02/09/2011   Right knee   TOTAL KNEE ARTHROPLASTY Left 04/11/2016   Procedure: LEFT TOTAL KNEE ARTHROPLASTY;  Surgeon: Marcey Raman, MD;  Location: [facility] OR;  Service: Orthopedics;  Laterality: Left;   UPPER GASTROINTESTINAL ENDOSCOPY      SOCIAL HISTORY: Social History   Socioeconomic History   Marital status: Married    Spouse name: Not on file   Number of children: 3   Years of education: Not on file   Highest education level: Not on file  Occupational History   Occupation: retired  Tobacco Use   Smoking status: Former    Current packs/day: 0.00    Types: Cigarettes    Quit date: 1985    Years since quitting: 40.9   Smokeless tobacco: Former    Types: Chew    Quit date: 1985   Tobacco comments:    quit in 1985. smoked 1ppd for 35 years   Vaping Use   Vaping status: Never Used  Substance and Sexual Activity   Alcohol use: No   Drug use: No   Sexual activity: Never  Other Topics Concern   Not on file  Social History Narrative   Married with children. 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 10:20 am    Has living will,  HCPOA: jerrie Hocevar, full code (reviewed 7)   Social Drivers of Health   Financial Resource Strain: Low Risk  (06/14/2023)   Overall Financial Resource Strain (CARDIA)    Difficulty of Paying Living Expenses: Not hard at all  Food Insecurity: No Food Insecurity (06/14/2023)   Hunger Vital Sign    Worried About Running Out of Food in the Last Year: Never true    Ran Out of Food in the Last Year: Never true   Transportation Needs: No Transportation Needs (06/14/2023)   PRAPARE - Administrator, Civil Service (Medical): No    Lack of Transportation (Non-Medical): No  Physical Activity: Inactive (06/14/2023)   Exercise Vital Sign    Days of Exercise per Week: 0 days    Minutes of Exercise per Session: 0 min  Stress: No Stress Concern Present (06/14/2023)   [employer] of Occupational Health - Occupational Stress Questionnaire    Feeling of Stress : Not at all  Social Connections: Socially Integrated (06/14/2023)   Social Connection and Isolation Panel    Frequency of Communication with Friends and Family: More than three times a week  Frequency of Social Gatherings with Friends and Family: Three times a week    Attends Religious Services: More than 4 times per year    Active Member of Clubs or Organizations: Yes    Attends Banker Meetings: More than 4 times per year    Marital Status: Married  Catering Manager Violence: Not At Risk (06/14/2023)   Humiliation, Afraid, Rape, and Kick questionnaire    Fear of Current or Ex-Partner: No    Emotionally Abused: No    Physically Abused: No    Sexually Abused: No    FAMILY HISTORY: Family History  Problem Relation Age of Onset   Heart failure Mother    COPD Mother    Heart disease Father    Heart attack Father    Heart disease Sister    Heart attack Sister    Heart attack Brother    Colon cancer Daughter 56       mets   Hypertension Son    Hypertension Son     ALLERGIES:  is allergic to protamine , statins, adhesive [tape], imodium  [loperamide ], and rosuvastatin  calcium .  MEDICATIONS:  Current Outpatient Medications  Medication Sig Dispense Refill   acetaminophen  (TYLENOL ) 500 MG tablet Take 500-1,000 mg by mouth 2 (two) times daily as needed (for pain).      aspirin  EC 81 MG tablet Take 81 mg by mouth daily. Swallow whole.     cholecalciferol  (VITAMIN D3) 25 MCG (1000 UNIT) tablet Take 1,000 Units by  mouth daily.     FEROSUL 325 (65 Fe) MG tablet Take 1 tablet by mouth once daily with breakfast 90 tablet 0   furosemide  (LASIX ) 20 MG tablet Take 1 tablet by mouth once daily 30 tablet 3   glucose blood (ONETOUCH ULTRA) test strip USE 1 STRIP TO CHECK GLUCOSE ONCE DAILY 100 each 3   isosorbide  mononitrate (IMDUR ) 30 MG 24 hr tablet Take 1 tablet by mouth once daily 90 tablet 3   nitroGLYCERIN  (NITROSTAT ) 0.4 MG SL tablet Place 1 tablet (0.4 mg total) under the tongue every 5 (five) minutes as needed for chest pain. Do not exceed a total of 3 doses in 15 minutes.  Please call [phone] to schedule an appointment for future refills. Thank you. 25 tablet 0   valsartan  (DIOVAN ) 40 MG tablet TAKE 1/2 (ONE-HALF) TABLET BY MOUTH ONCE DAILY (CALL  [phone]  TO  SCHEDULE  AN  APPOINTMENT  FOR  FUTURE  REFILLS) 45 tablet 0   vitamin B-12 (CYANOCOBALAMIN ) 1000 MCG tablet Take 1,000 mcg by mouth daily.     No current facility-administered medications for this visit.    REVIEW OF SYSTEMS:   Constitutional: ( - ) fevers, ( - )  chills , ( - ) night sweats Eyes: ( - ) blurriness of vision, ( - ) double vision, ( - ) watery eyes Ears, nose, mouth, throat, and face: ( - ) mucositis, ( - ) sore throat Respiratory: ( - ) cough, ( + ) dyspnea, ( - ) wheezes Cardiovascular: ( - ) palpitation, ( - ) chest discomfort, ( - ) lower extremity swelling Gastrointestinal:  ( - ) nausea, ( - ) heartburn, ( - ) change in bowel habits Skin: ( - ) abnormal skin rashes Lymphatics: ( - ) new lymphadenopathy, ( - ) easy bruising Neurological: ( - ) numbness, ( - ) tingling, ( - ) new weaknesses Behavioral/Psych: ( - ) mood change, ( - ) new changes  All other systems were  reviewed with the patient and are negative.  PHYSICAL EXAMINATION: ECOG PERFORMANCE STATUS: 1 - Symptomatic but completely ambulatory  Vitals:   05/29/24 1143  BP: 133/75  Pulse: 86  Resp: 16  Temp: 97.9 F (36.6 C)  SpO2: 98%   Filed  Weights   05/29/24 1143  Weight: 186 lb (84.4 kg)    GENERAL: elderly Caucasian male in NAD  SKIN: skin color, texture, turgor are normal, no rashes or significant lesions EYES: conjunctiva are pink and non-injected, sclera clear LUNGS: clear to auscultation and percussion with normal breathing effort HEART: regular rate & rhythm and no murmurs. Bilateral lower extremity edema Musculoskeletal: no cyanosis of digits and no clubbing  PSYCH: alert & oriented x 3, fluent speech NEURO: no focal motor/sensory deficits  LABORATORY DATA:  I have reviewed the data as listed    Latest Ref Rng & Units 05/29/2024   11:04 AM 03/19/2024   11:40 AM 11/29/2023    1:35 PM  CBC  WBC 4.0 - 10.5 K/uL 5.9  8.0  8.6   Hemoglobin 13.0 - 17.0 g/dL 87.7  9.3  88.6   Hematocrit 39.0 - 52.0 % 37.8  30.5  35.4   Platelets 150 - 400 K/uL 251  317  256        Latest Ref Rng & Units 05/29/2024   11:04 AM 03/19/2024   11:40 AM 11/02/2022    3:56 PM  CMP  Glucose 70 - 99 mg/dL 762  893  881   BUN 8 - 23 mg/dL 22  23  24    Creatinine 0.61 - 1.24 mg/dL 9.20  9.20  9.03   Sodium 135 - 145 mmol/L 144  142  139   Potassium 3.5 - 5.1 mmol/L 3.7  4.0  4.2   Chloride 98 - 111 mmol/L 107  109  107   CO2 22 - 32 mmol/L 26  27  23    Calcium  8.9 - 10.3 mg/dL 9.4  9.1  9.0   Total Protein 6.5 - 8.1 g/dL 6.6  6.6  6.3   Total Bilirubin 0.0 - 1.2 mg/dL 0.3  0.4  0.5   Alkaline Phos 38 - 126 U/L 87  79  49   AST 15 - 41 U/L 19  12  20    ALT 0 - 44 U/L 10  10  14      ASSESSMENT & PLAN [name] is a 89 y.o. male who returns for a follow up for iron  deficiency anemia.   #iron  deficiency anemia 2/2 GI bleeding: --Underwent EGD/colonoscopy on 11/07/2021 with Dr. Legrand. EGD showed large hiatal hernia with gastric erosions with stigmata of recent bleeding. Colonoscopy did not show any signs of bleeding.  --Last received IV venofer  200 mg x 5 doses from 04/02/2024-04/30/2024 --Labs today shows stable anemia with Hgb  12.2, WBC 5.9, MCV 85.9, Plt 251  --No need for additional IV iron  at this time.  --Recommend to continue on ferrous sulfate  325 mg once daily with a source of vitamin C --encouraged to eat iron  rich foods.  --RTC in 3 months with labs only and 6 months with labs/follow up.  #Dyspnea on exertion #Lower extremity edema: --Patient plans to follow up with cardiologist for further evaluation.   No orders of the defined types were placed in this encounter.   All questions were answered. The patient knows to call the clinic with any problems, questions or concerns.  I have spent a total of 25 minutes minutes of face-to-face  and non-face-to-face time, preparing to see the patient, performing a medically appropriate examination, counseling and educating the patient, ordering medications, documenting clinical information in the electronic health record,  and care coordination.   Norleen IVAR Kidney, MD Department of Hematology/Oncology [facility] Phone: [phone] Pager: [phone] Email: [email or web address]

## 2024-05-29 ENCOUNTER — Inpatient Hospital Stay: Attending: Hematology and Oncology

## 2024-05-29 ENCOUNTER — Inpatient Hospital Stay: Admitting: Hematology and Oncology

## 2024-05-29 VITALS — BP 133/75 | HR 86 | Temp 97.9°F | Resp 16 | Wt 186.0 lb

## 2024-05-29 DIAGNOSIS — R0609 Other forms of dyspnea: Secondary | ICD-10-CM | POA: Diagnosis not present

## 2024-05-29 DIAGNOSIS — Z800 Family history of malignant neoplasm of digestive organs: Secondary | ICD-10-CM | POA: Diagnosis not present

## 2024-05-29 DIAGNOSIS — R600 Localized edema: Secondary | ICD-10-CM | POA: Diagnosis not present

## 2024-05-29 DIAGNOSIS — Z87891 Personal history of nicotine dependence: Secondary | ICD-10-CM | POA: Insufficient documentation

## 2024-05-29 DIAGNOSIS — D500 Iron deficiency anemia secondary to blood loss (chronic): Secondary | ICD-10-CM | POA: Diagnosis not present

## 2024-05-29 LAB — CBC WITH DIFFERENTIAL (CANCER CENTER ONLY)
Abs Immature Granulocytes: 0.03 K/uL (ref 0.00–0.07)
Basophils Absolute: 0.1 K/uL (ref 0.0–0.1)
Basophils Relative: 1 %
Eosinophils Absolute: 0.2 K/uL (ref 0.0–0.5)
Eosinophils Relative: 4 %
HCT: 37.8 % — ABNORMAL LOW (ref 39.0–52.0)
Hemoglobin: 12.2 g/dL — ABNORMAL LOW (ref 13.0–17.0)
Immature Granulocytes: 1 %
Lymphocytes Relative: 20 %
Lymphs Abs: 1.2 K/uL (ref 0.7–4.0)
MCH: 27.7 pg (ref 26.0–34.0)
MCHC: 32.3 g/dL (ref 30.0–36.0)
MCV: 85.9 fL (ref 80.0–100.0)
Monocytes Absolute: 0.4 K/uL (ref 0.1–1.0)
Monocytes Relative: 6 %
Neutro Abs: 4.1 K/uL (ref 1.7–7.7)
Neutrophils Relative %: 68 %
Platelet Count: 251 K/uL (ref 150–400)
RBC: 4.4 MIL/uL (ref 4.22–5.81)
RDW: 15.9 % — ABNORMAL HIGH (ref 11.5–15.5)
WBC Count: 5.9 K/uL (ref 4.0–10.5)
nRBC: 0 % (ref 0.0–0.2)

## 2024-05-29 LAB — CMP (CANCER CENTER ONLY)
ALT: 10 U/L (ref 0–44)
AST: 19 U/L (ref 15–41)
Albumin: 4 g/dL (ref 3.5–5.0)
Alkaline Phosphatase: 87 U/L (ref 38–126)
Anion gap: 12 (ref 5–15)
BUN: 22 mg/dL (ref 8–23)
CO2: 26 mmol/L (ref 22–32)
Calcium: 9.4 mg/dL (ref 8.9–10.3)
Chloride: 107 mmol/L (ref 98–111)
Creatinine: 0.79 mg/dL (ref 0.61–1.24)
GFR, Estimated: >60 mL/min (ref >60)
Glucose, Bld: 237 mg/dL — ABNORMAL HIGH (ref 70–99)
Potassium: 3.7 mmol/L (ref 3.5–5.1)
Sodium: 144 mmol/L (ref 135–145)
Total Bilirubin: 0.3 mg/dL (ref 0.0–1.2)
Total Protein: 6.6 g/dL (ref 6.5–8.1)

## 2024-05-29 LAB — IRON AND IRON BINDING CAPACITY (CC-WL,HP ONLY)
Iron: 45 ug/dL (ref 45–182)
Saturation Ratios: 13 % — ABNORMAL LOW (ref 17.9–39.5)
TIBC: 337 ug/dL (ref 250–450)
UIBC: 293 ug/dL

## 2024-05-29 LAB — FERRITIN: Ferritin: 107 ng/mL (ref 24–336)

## 2024-05-31 ENCOUNTER — Other Ambulatory Visit: Payer: Self-pay | Admitting: Nurse Practitioner

## 2024-06-01 ENCOUNTER — Other Ambulatory Visit: Payer: Self-pay | Admitting: Family Medicine

## 2024-06-01 ENCOUNTER — Encounter: Payer: Self-pay | Admitting: Physician Assistant

## 2024-06-01 DIAGNOSIS — J90 Pleural effusion, not elsewhere classified: Secondary | ICD-10-CM

## 2024-06-03 ENCOUNTER — Ambulatory Visit: Payer: Self-pay

## 2024-06-03 NOTE — Telephone Encounter (Signed)
-----   Message from Nurse Almarie T sent at 06/03/2024  9:57 AM EST -----  ----- Message ----- From: Federico Norleen ONEIDA MADISON, MD Sent: 06/01/2024   6:50 PM EST To: Almarie DELENA Arabia, RN  Please let Mr. Ignatowski know that his iron  labs look good.  His ferritin stores are over 100.  We will plan to see him back in 3 months for labs in 6 months for clinic visit. ----- Message ----- From: Rebecka, Lab In [place] Sent: 05/29/2024  11:19 AM EST To: Norleen ONEIDA Federico MADISON, MD

## 2024-06-03 NOTE — Telephone Encounter (Signed)
 Please schedule CPE with fasting labs prior after 06/14/24 for Dr. Avelina.

## 2024-06-03 NOTE — Telephone Encounter (Signed)
 TC to Mr. Nielsen regarding recent labs.  Spoke with his wife and updated that his iron  labs look good.  His ferritin stores are over 100. Advised that Dr Federico  will plan to see him back in 3 months for labs in 6 months for clinic visit. Reviewed dates and times of appts.  Mrs. Gilmer verbalized an understanding. Ronal Pray, RN

## 2024-06-03 NOTE — Telephone Encounter (Signed)
No vm sent my chart

## 2024-06-14 ENCOUNTER — Ambulatory Visit: Payer: Medicare HMO

## 2024-06-14 VITALS — Ht 65.75 in | Wt 186.0 lb

## 2024-06-14 DIAGNOSIS — Z0000 Encounter for general adult medical examination without abnormal findings: Secondary | ICD-10-CM | POA: Diagnosis not present

## 2024-06-14 NOTE — Patient Instructions (Signed)
 Mr. Montelongo,  Thank you for taking the time for your Medicare Wellness Visit. I appreciate your continued commitment to your health goals. Please review the care plan we discussed, and feel free to reach out if I can assist you further.  Please note that Annual Wellness Visits do not include a physical exam. Some assessments may be limited, especially if the visit was conducted virtually. If needed, we may recommend an in-person follow-up with your provider.  Ongoing Care Seeing your primary care provider every 3 to 6 months helps us  monitor your health and provide consistent, personalized care.   Referrals If a referral was made during today's visit and you haven't received any updates within two weeks, please contact the referred provider directly to check on the status.  Recommended Screenings:  Health Maintenance  Topic Date Due   DTaP/Tdap/Td vaccine (1 - Tdap) Never done   Zoster (Shingles) Vaccine (1 of 2) Never done   COVID-19 Vaccine (3 - Pfizer risk series) 11/05/2019   Complete foot exam   06/23/2021   Eye exam for diabetics  03/26/2023   Hemoglobin A1C  12/21/2023   Flu Shot  02/09/2024   Medicare Annual Wellness Visit  06/13/2024   Pneumococcal Vaccine for age over 18  Completed   Meningitis B Vaccine  Aged Out       06/14/2023    1:13 PM  Advanced Directives  Does Patient Have a Medical Advance Directive? Yes  Type of Estate Agent of [place];Living will  Copy of Healthcare Power of Attorney in Chart? No - copy requested    Vision: Annual vision screenings are recommended for early detection of glaucoma, cataracts, and diabetic retinopathy. These exams can also reveal signs of chronic conditions such as diabetes and high blood pressure.  Dental: Annual dental screenings help detect early signs of oral cancer, gum disease, and other conditions linked to overall health, including heart disease and diabetes.

## 2024-06-14 NOTE — Progress Notes (Signed)
 Chief Complaint  Patient presents with   Medicare Wellness     Subjective:   [name] is a 89 y.o. male who presents for a Medicare Annual Wellness Visit.  Visit info / Clinical Intake: Medicare Wellness Visit Type:: Subsequent Annual Wellness Visit Persons participating in visit and providing information:: patient Medicare Wellness Visit Mode:: Telephone If telephone:: video declined Since this visit was completed virtually, some vitals may be partially provided or unavailable. Missing vitals are due to the limitations of the virtual format.: Unable to obtain vitals - no equipment If Telephone or Video please confirm:: I connected with patient using audio/video enable telemedicine. I verified patient identity with two identifiers, discussed telehealth limitations, and patient agreed to proceed. Patient Location:: home Provider Location:: clinic Interpreter Needed?: No Pre-visit prep was completed: yes AWV questionnaire completed by patient prior to visit?: no Living arrangements:: lives with spouse/significant other Patient's Overall Health Status Rating: good Typical amount of pain: none Does pain affect daily life?: no Are you currently prescribed opioids?: no  Dietary Habits and Nutritional Risks How many meals a day?: 3 Eats fruit and vegetables daily?: yes (not fruit much) Most meals are obtained by: preparing own meals In the last 2 weeks, have you had any of the following?: none Diabetic:: (!) yes Any non-healing wounds?: no How often do you check your BS?: 0 Would you like to be referred to a Nutritionist or for Diabetic Management? : no  Functional Status Activities of Daily Living (to include ambulation/medication): Independent Ambulation: Independent with device- listed below Home Assistive Devices/Equipment: Johna Finder (specify Type) Medication Administration: Independent Home Management (perform basic housework or laundry): Independent Manage your  own finances?: yes Primary transportation is: driving Concerns about vision?: no *vision screening is required for WTM* Concerns about hearing?: (!) yes Uses hearing aids?: (!) yes Hear whispered voice?: (!) no *in-person visit only*  Fall Screening Falls in the past year?: 0 Number of falls in past year: 0 Was there an injury with Fall?: 0 Fall Risk Category Calculator: 0 Patient Fall Risk Level: Low Fall Risk  Fall Risk Patient at Risk for Falls Due to: No Fall Risks Fall risk Follow up: Education provided; Falls prevention discussed  Home and Transportation Safety: All rugs have non-skid backing?: N/A, no rugs All stairs or steps have railings?: yes Grab bars in the bathtub or shower?: yes Have non-skid surface in bathtub or shower?: yes Good home lighting?: yes Regular seat belt use?: yes Hospital stays in the last year:: no  Cognitive Assessment Difficulty concentrating, remembering, or making decisions? : no Will 6CIT or Mini Cog be Completed: yes What year is it?: 0 points What month is it?: 0 points Give patient an address phrase to remember (5 components): [address] [place]  About what time is it?: 0 points Count backwards from 20 to 1: 0 points Say the months of the year in reverse: 2 points Repeat the address phrase from earlier: 0 points 6 CIT Score: 2 points  Advance Directives (For Healthcare) Does Patient Have a Medical Advance Directive?: Yes Type of Advance Directive: Living will; Healthcare Power of Attorney Copy of Healthcare Power of Attorney in Chart?: No - copy requested Copy of Living Will in Chart?: No - copy requested  Reviewed/Updated  Reviewed/Updated: Reviewed All (Medical, Surgical, Family, Medications, Allergies, Care Teams, Patient Goals)    Allergies (verified) Protamine , Statins, Adhesive [tape], Imodium  [loperamide ], and Rosuvastatin  calcium    Current Medications (verified) Outpatient Encounter Medications as of  06/14/2024  Medication Sig   acetaminophen  (TYLENOL ) 500 MG tablet Take 500-1,000 mg by mouth 2 (two) times daily as needed (for pain).    aspirin  EC 81 MG tablet Take 81 mg by mouth daily. Swallow whole.   cholecalciferol  (VITAMIN D3) 25 MCG (1000 UNIT) tablet Take 1,000 Units by mouth daily.   FEROSUL 325 (65 Fe) MG tablet Take 1 tablet by mouth once daily with breakfast   furosemide  (LASIX ) 20 MG tablet Take 1 tablet by mouth once daily   glucose blood (ONETOUCH ULTRA) test strip USE 1 STRIP TO CHECK GLUCOSE ONCE DAILY   isosorbide  mononitrate (IMDUR ) 30 MG 24 hr tablet Take 1 tablet by mouth once daily   nitroGLYCERIN  (NITROSTAT ) 0.4 MG SL tablet Place 1 tablet (0.4 mg total) under the tongue every 5 (five) minutes as needed for chest pain. Do not exceed a total of 3 doses in 15 minutes.  Please call [phone] to schedule an appointment for future refills. Thank you.   valsartan  (DIOVAN ) 40 MG tablet Take 0.5 tablets (20 mg total) by mouth daily.   vitamin B-12 (CYANOCOBALAMIN ) 1000 MCG tablet Take 1,000 mcg by mouth daily.   No facility-administered encounter medications on file as of 06/14/2024.    History: Past Medical History:  Diagnosis Date   CAD (coronary artery disease)    s/p stent mid LAD 2005 // s/p CABG 2016 // Myoview  8/19:  EF 64, no ischemia   Coronary artery disease involving native coronary artery of native heart without angina pectoris    Depression    DJD (degenerative joint disease)    DM type 2 with diabetic peripheral neuropathy (HCC) 03/10/2017   E coli bacteremia 04/20/2016   Essential hypertension 04/20/2016   GERD (gastroesophageal reflux disease)    History of echocardiogram    Echo 8/19: Moderate LVH, EF 60-65, normal wall motion, grade 1 diastolic dysfunction, mild MR, mild LAE, normal RVSF, mild TR, PASP 25   History of hiatal hernia    HTN (hypertension)    Hyperlipidemia    Neuropathy due to type 2 diabetes mellitus (HCC) 03/10/2017   PERCUTANEOUS  TRANSLUMINAL CORONARY ANGIOPLASTY, HX OF 11/26/2007   Annotation: With CYPHER STENT. Qualifier: Diagnosis of  By: Nickola CMA, [place]     Pre-diabetes    S/P CABG x 4 11/03/2014   S/P total knee replacement 04/11/2016   Sepsis due to Escherichia coli (E. coli) (HCC) 04/20/2016   Tinea pedis 09/08/2017   Past Surgical History:  Procedure Laterality Date   BRAIN SURGERY     CARDIAC CATHETERIZATION     COLONOSCOPY     CORONARY ARTERY BYPASS GRAFT N/A 11/03/2014   Procedure: CORONARY ARTERY BYPASS GRAFTING (CABG), ON PUMP, TIMES FOUR, USING LEFT INTERNAL MAMMARY, RIGHT GREATER SAPHENOUS VEIN HARVESTED ENDOSCOPICALLY;  Surgeon: Maude Fleeta Ochoa, MD;  Location: [facility] OR;  Service: Open Heart Surgery;  Laterality: N/A;  LIMA-LAD; SVG-DIAG; SVG-OM; SVG-RCA   CRANIOTOMY N/A 01/18/2014   Procedure: CRANIOTOMY HEMATOMA EVACUATION SUBDURAL;  Surgeon: Lamar LELON Peaches, MD;  Location: [facility] OR;  Service: Neurosurgery;  Laterality: N/A;   kidney stone removal     KNEE SURGERY     LEFT HEART CATHETERIZATION WITH CORONARY ANGIOGRAM N/A 07/27/2011   Procedure: LEFT HEART CATHETERIZATION WITH CORONARY ANGIOGRAM;  Surgeon: Lonni JONETTA Cash, MD;  Location: [facility] CATH LAB;  Service: Cardiovascular;  Laterality: N/A;   LEFT HEART CATHETERIZATION WITH CORONARY ANGIOGRAM N/A 10/31/2014   Procedure: LEFT HEART CATHETERIZATION WITH CORONARY ANGIOGRAM;  Surgeon: Ozell Fell,  MD;  Location: [facility] CATH LAB;  Service: Cardiovascular;  Laterality: N/A;   PTCA     Hx of it.    SIGMOIDOSCOPY     TEE WITHOUT CARDIOVERSION N/A 11/03/2014   Procedure: TRANSESOPHAGEAL ECHOCARDIOGRAM (TEE);  Surgeon: Maude Fleeta Ochoa, MD;  Location: [facility] OR;  Service: Open Heart Surgery;  Laterality: N/A;   TOTAL KNEE ARTHROPLASTY  02/09/2011   Right knee   TOTAL KNEE ARTHROPLASTY Left 04/11/2016   Procedure: LEFT TOTAL KNEE ARTHROPLASTY;  Surgeon: Marcey Raman, MD;  Location: [facility] OR;  Service: Orthopedics;  Laterality: Left;   UPPER GASTROINTESTINAL  ENDOSCOPY     Family History  Problem Relation Age of Onset   Heart failure Mother    COPD Mother    Heart disease Father    Heart attack Father    Heart disease Sister    Heart attack Sister    Heart attack Brother    Colon cancer Daughter 61       mets   Hypertension Son    Hypertension Son    Social History   Occupational History   Occupation: retired  Tobacco Use   Smoking status: Former    Current packs/day: 0.00    Types: Cigarettes    Quit date: 1985    Years since quitting: 40.9   Smokeless tobacco: Former    Types: Chew    Quit date: 1985   Tobacco comments:    quit in 1985. smoked 1ppd for 35 years   Vaping Use   Vaping status: Never Used  Substance and Sexual Activity   Alcohol use: No   Drug use: No   Sexual activity: Never   Tobacco Counseling Counseling given: Not Answered Tobacco comments: quit in 1985. smoked 1ppd for 35 years   SDOH Screenings   Food Insecurity: No Food Insecurity (06/14/2024)  Housing: Unknown (06/14/2024)  Transportation Needs: No Transportation Needs (06/14/2024)  Utilities: Not At Risk (06/14/2024)  Alcohol Screen: Low Risk  (06/14/2023)  Depression (PHQ2-9): Low Risk  (06/14/2024)  Financial Resource Strain: Low Risk  (06/14/2023)  Physical Activity: Inactive (06/14/2024)  Social Connections: Socially Integrated (06/14/2024)  Stress: No Stress Concern Present (06/14/2023)  Tobacco Use: Medium Risk (06/14/2024)  Health Literacy: Adequate Health Literacy (06/14/2024)   See flowsheets for full screening details  Depression Screen PHQ 2 & 9 Depression Scale- Over the past 2 weeks, how often have you been bothered by any of the following problems? Little interest or pleasure in doing things: 0 Feeling down, depressed, or hopeless (PHQ Adolescent also includes...irritable): 1 PHQ-2 Total Score: 1     Goals Addressed             This Visit's Progress    I would like to get my balance corrected and want to do more things        COMPLETED: Increase physical activity       Starting 03/07/2018, I will continue to stretch for 20-30 minutes daily.      COMPLETED: Increase physical activity       Walking more     COMPLETED: Patient Stated       06/17/2020, I will continue to stretch for about 10 minutes daily.      COMPLETED: Patient Stated       Would like to maintain current routine            Objective:    Today's Vitals   06/14/24 1307  Weight: 186 lb (84.4 kg)  Height: 5' 5.75 (  1.67 m)   Body mass index is 30.25 kg/m.  Hearing/Vision screen Vision Screening - Comments:: UTD w/Bulakowski Immunizations and Health Maintenance Health Maintenance  Topic Date Due   DTaP/Tdap/Td (1 - Tdap) Never done   Zoster Vaccines- Shingrix (1 of 2) Never done   COVID-19 Vaccine (3 - Pfizer risk series) 11/05/2019   FOOT EXAM  06/23/2021   OPHTHALMOLOGY EXAM  03/26/2023   HEMOGLOBIN A1C  12/21/2023   Influenza Vaccine  02/09/2024   Medicare Annual Wellness (AWV)  06/14/2025   Pneumococcal Vaccine: 50+ Years  Completed   Meningococcal B Vaccine  Aged Out        Assessment/Plan:  This is a routine wellness examination for [name].  Patient Care Team: Avelina Greig BRAVO, MD as PCP - General (Family Medicine) Verlin Lonni BIRCH, MD as PCP - Cardiology (Cardiology) Roz Anes, MD as Consulting Physician (Ophthalmology)  I have personally reviewed and noted the following in the patient's chart:   Medical and social history Use of alcohol, tobacco or illicit drugs  Current medications and supplements including opioid prescriptions. Functional ability and status Nutritional status Physical activity Advanced directives List of other physicians Hospitalizations, surgeries, and ER visits in previous 12 months Vitals Screenings to include cognitive, depression, and falls Referrals and appointments  No orders of the defined types were placed in this encounter.  In addition, I have reviewed and  discussed with patient certain preventive protocols, quality metrics, and best practice recommendations. A written personalized care plan for preventive services as well as general preventive health recommendations were provided to patient.   Craig LITTIE Saris, LPN   [id number]    After Visit Summary: (MyChart) Due to this being a telephonic visit, the after visit summary with patients personalized plan was offered to patient via MyChart   Nurse Notes: Pt relays during visit that he is concerned about his balance worsening

## 2024-06-19 ENCOUNTER — Other Ambulatory Visit: Payer: Self-pay | Admitting: Cardiovascular Disease

## 2024-06-20 ENCOUNTER — Encounter: Payer: Self-pay | Admitting: Family Medicine

## 2024-06-20 ENCOUNTER — Ambulatory Visit (INDEPENDENT_AMBULATORY_CARE_PROVIDER_SITE_OTHER): Admitting: Family Medicine

## 2024-06-20 VITALS — BP 144/88 | HR 94 | Temp 97.8°F | Ht 65.75 in | Wt 186.0 lb

## 2024-06-20 DIAGNOSIS — R42 Dizziness and giddiness: Secondary | ICD-10-CM | POA: Insufficient documentation

## 2024-06-20 DIAGNOSIS — R2689 Other abnormalities of gait and mobility: Secondary | ICD-10-CM | POA: Diagnosis not present

## 2024-06-20 DIAGNOSIS — H7391 Unspecified disorder of tympanic membrane, right ear: Secondary | ICD-10-CM | POA: Insufficient documentation

## 2024-06-20 NOTE — Assessment & Plan Note (Signed)
 Minimal, description not clearly suggestive of presyncope.

## 2024-06-20 NOTE — Progress Notes (Signed)
 Patient ID: [name], male    DOB: 04/15/1935, 89 y.o.   MRN: [id number]  This visit was conducted in person.  BP (!) 144/88 (BP Location: Left Arm, Patient Position: Sitting, Cuff Size: Large)   Pulse 94   Temp 97.8 F (36.6 C) (Temporal)   Ht 5' 5.75 (1.67 m)   Wt 186 lb (84.4 kg)   SpO2 97%   BMI 30.25 kg/m    CC:  Chief Complaint  Patient presents with   Follow-up    Reports that is balance is worse and would like an ENT referral    Subjective:   HPI: [name] is a 89 y.o. male presenting on 06/20/2024 for Follow-up (Reports that is balance is worse and would like an ENT referral)  Patient with history of hypertension, diabetes with associated neuropathy, paroxysmal atrial fibrillation, iron  deficiency anemia.  He reports  ongoing in last year. May ave started after COVID 2-3 years.   Worsening in last few months Has fallen more lately.. using cane or walker for balance.   No better with treatment of anemia.   No  clear room spinning..  but  occ dizzy in AM  Has chronic post nasal drip  No nausea.   When he bends over notes ears popping.  left ear sore. Chronic ET in irght ear not sure why  Uses hearing aids   He is doing some home PT.     He has DM neuropathy.       Relevant past medical, surgical, family and social history reviewed and updated as indicated. Interim medical history since our last visit reviewed. Allergies and medications reviewed and updated. Outpatient Medications Prior to Visit  Medication Sig Dispense Refill   acetaminophen  (TYLENOL ) 500 MG tablet Take 500-1,000 mg by mouth 2 (two) times daily as needed (for pain).      aspirin  EC 81 MG tablet Take 81 mg by mouth daily. Swallow whole.     cholecalciferol  (VITAMIN D3) 25 MCG (1000 UNIT) tablet Take 1,000 Units by mouth daily.     FEROSUL 325 (65 Fe) MG tablet Take 1 tablet by mouth once daily with breakfast 90 tablet 0   furosemide  (LASIX ) 20 MG tablet Take 1 tablet by  mouth once daily 30 tablet 0   glucose blood (ONETOUCH ULTRA) test strip USE 1 STRIP TO CHECK GLUCOSE ONCE DAILY 100 each 3   isosorbide  mononitrate (IMDUR ) 30 MG 24 hr tablet Take 1 tablet by mouth once daily 90 tablet 3   nitroGLYCERIN  (NITROSTAT ) 0.4 MG SL tablet Place 1 tablet (0.4 mg total) under the tongue every 5 (five) minutes as needed for chest pain. Do not exceed a total of 3 doses in 15 minutes.  Please call [phone] to schedule an appointment for future refills. Thank you. 25 tablet 0   valsartan  (DIOVAN ) 40 MG tablet Take 0.5 tablets (20 mg total) by mouth daily. 15 tablet 0   vitamin B-12 (CYANOCOBALAMIN ) 1000 MCG tablet Take 1,000 mcg by mouth daily.     No facility-administered medications prior to visit.     Per HPI unless specifically indicated in ROS section below Review of Systems  Constitutional:  Negative for fatigue and fever.  HENT:  Positive for congestion and postnasal drip. Negative for ear pain.   Eyes:  Negative for pain.  Respiratory:  Negative for cough and shortness of breath.   Cardiovascular:  Negative for chest pain, palpitations and leg swelling.  Gastrointestinal:  Negative  for abdominal pain.  Genitourinary:  Negative for dysuria.  Musculoskeletal:  Negative for arthralgias.  Neurological:  Negative for syncope, light-headedness and headaches.  Psychiatric/Behavioral:  Negative for dysphoric mood.    Objective:  BP (!) 144/88 (BP Location: Left Arm, Patient Position: Sitting, Cuff Size: Large)   Pulse 94   Temp 97.8 F (36.6 C) (Temporal)   Ht 5' 5.75 (1.67 m)   Wt 186 lb (84.4 kg)   SpO2 97%   BMI 30.25 kg/m   Wt Readings from Last 3 Encounters:  06/20/24 186 lb (84.4 kg)  06/14/24 186 lb (84.4 kg)  05/29/24 186 lb (84.4 kg)      Physical Exam Vitals reviewed.  Constitutional:      Appearance: He is well-developed.  HENT:     Head: Normocephalic.     Right Ear: Hearing normal. A middle ear effusion is present. Tympanic membrane  is scarred.     Left Ear: Hearing normal. A middle ear effusion is present.     Ears:     Comments: Right TM with ETube in placed drainingmucus    Nose: Nose normal.  Neck:     Thyroid : No thyroid  mass or thyromegaly.     Vascular: No carotid bruit.     Trachea: Trachea normal.  Cardiovascular:     Rate and Rhythm: Normal rate and regular rhythm.     Pulses: Normal pulses.     Heart sounds: Heart sounds not distant. No murmur heard.    No friction rub. No gallop.     Comments: No peripheral edema Pulmonary:     Effort: Pulmonary effort is normal. No respiratory distress.     Breath sounds: Normal breath sounds.  Skin:    General: Skin is warm and dry.     Findings: No rash.  Neurological:     Mental Status: He is alert and oriented to person, place, and time.     Cranial Nerves: Cranial nerves 2-12 are intact.     Sensory: Sensory deficit present.     Motor: Motor function is intact.     Coordination: Coordination is intact. Romberg sign negative. Coordination normal. Finger-Nose-Finger Test normal.     Gait: Gait abnormal.     Comments: Slowed gait  Decreased sensation in feet  Psychiatric:        Speech: Speech normal.        Behavior: Behavior normal.        Thought Content: Thought content normal.       Results for orders placed or performed in visit on 05/29/24  Ferritin   Collection Time: 05/29/24 11:04 AM  Result Value Ref Range   Ferritin 107 24 - 336 ng/mL  Iron  and Iron  Binding Capacity ([facility] only)   Collection Time: 05/29/24 11:04 AM  Result Value Ref Range   Iron  45 45 - 182 ug/dL   TIBC 662 749 - 549 ug/dL   Saturation Ratios 13 (L) 17.9 - 39.5 %   UIBC 293 ug/dL  CMP ([facility] only)   Collection Time: 05/29/24 11:04 AM  Result Value Ref Range   Sodium 144 135 - 145 mmol/L   Potassium 3.7 3.5 - 5.1 mmol/L   Chloride 107 98 - 111 mmol/L   CO2 26 22 - 32 mmol/L   Glucose, Bld 237 (H) 70 - 99 mg/dL   BUN 22 8 - 23 mg/dL   Creatinine 9.20  9.38 - 1.24 mg/dL   Calcium  9.4 8.9 - 10.3 mg/dL  Total Protein 6.6 6.5 - 8.1 g/dL   Albumin  4.0 3.5 - 5.0 g/dL   AST 19 15 - 41 U/L   ALT 10 0 - 44 U/L   Alkaline Phosphatase 87 38 - 126 U/L   Total Bilirubin 0.3 0.0 - 1.2 mg/dL   GFR, Estimated >39 >39 mL/min   Anion gap 12 5 - 15  CBC with Differential ([facility] Only)   Collection Time: 05/29/24 11:04 AM  Result Value Ref Range   WBC Count 5.9 4.0 - 10.5 K/uL   RBC 4.40 4.22 - 5.81 MIL/uL   Hemoglobin 12.2 (L) 13.0 - 17.0 g/dL   HCT 62.1 (L) 60.9 - 47.9 %   MCV 85.9 80.0 - 100.0 fL   MCH 27.7 26.0 - 34.0 pg   MCHC 32.3 30.0 - 36.0 g/dL   RDW 84.0 (H) 88.4 - 84.4 %   Platelet Count 251 150 - 400 K/uL   nRBC 0.0 0.0 - 0.2 %   Neutrophils Relative % 68 %   Neutro Abs 4.1 1.7 - 7.7 K/uL   Lymphocytes Relative 20 %   Lymphs Abs 1.2 0.7 - 4.0 K/uL   Monocytes Relative 6 %   Monocytes Absolute 0.4 0.1 - 1.0 K/uL   Eosinophils Relative 4 %   Eosinophils Absolute 0.2 0.0 - 0.5 K/uL   Basophils Relative 1 %   Basophils Absolute 0.1 0.0 - 0.1 K/uL   Immature Granulocytes 1 %   Abs Immature Granulocytes 0.03 0.00 - 0.07 K/uL    Assessment and Plan  Abnormal tympanic membrane of right ear Assessment & Plan: Patient requests referral to ENT for balance issues although in discussion his symptoms do not clearly sound like vertigo.  Encouraged him to start Flonase  2 sprays per nostril daily to remove any eustachian tube dysfunction/swelling that could be causing fluid behind his ears.  His right tympanic membrane has can significant scarring and continues to have a eustachian tube it has been present for a long time.   Balance problem Assessment & Plan: Acute, most likely multifactorial In part due to peripheral neuropathy and poor proprioception.  Strength appears normal on exam. Patient does not give specific history but does not seem to describe vertigo. Possible component of fluid behind eardrums causing balance  issues. Patient also has significant hammertoes in the right foot which may be causing some gait issues.  Offered referral to physical therapy for balance retraining but he will speak with his granddaughter who is a physical therapist about her recommendations.    Dizziness Assessment & Plan: Minimal, description not clearly suggestive of presyncope.     No follow-ups on file.   Greig Ring, MD

## 2024-06-20 NOTE — Assessment & Plan Note (Signed)
 Acute, most likely multifactorial In part due to peripheral neuropathy and poor proprioception.  Strength appears normal on exam. Patient does not give specific history but does not seem to describe vertigo. Possible component of fluid behind eardrums causing balance issues. Patient also has significant hammertoes in the right foot which may be causing some gait issues.  Offered referral to physical therapy for balance retraining but he will speak with his granddaughter who is a physical therapist about her recommendations.

## 2024-06-20 NOTE — Patient Instructions (Addendum)
 Flonase   Consider balance retraining PT. Referral to ENT

## 2024-06-20 NOTE — Assessment & Plan Note (Signed)
 Patient requests referral to ENT for balance issues although in discussion his symptoms do not clearly sound like vertigo.  Encouraged him to start Flonase  2 sprays per nostril daily to remove any eustachian tube dysfunction/swelling that could be causing fluid behind his ears.  His right tympanic membrane has can significant scarring and continues to have a eustachian tube it has been present for a long time.

## 2024-06-21 MED ORDER — ISOSORBIDE MONONITRATE ER 30 MG PO TB24: 30.0000 mg | ORAL_TABLET | Freq: Every day | ORAL | 0 refills | Status: DC

## 2024-06-21 NOTE — Addendum Note (Signed)
 Addended by: BLUFORD, Huyen Perazzo L on: 06/21/2024 09:49 AM   Modules accepted: Orders

## 2024-07-01 ENCOUNTER — Other Ambulatory Visit: Payer: Self-pay | Admitting: Family Medicine

## 2024-07-01 ENCOUNTER — Telehealth: Payer: Self-pay | Admitting: Cardiovascular Disease

## 2024-07-01 ENCOUNTER — Other Ambulatory Visit: Payer: Self-pay | Admitting: Cardiovascular Disease

## 2024-07-01 DIAGNOSIS — J90 Pleural effusion, not elsewhere classified: Secondary | ICD-10-CM

## 2024-07-01 NOTE — Telephone Encounter (Signed)
" °*  STAT* If patient is at the pharmacy, call can be transferred to refill team.   1. Which medications need to be refilled? (please list name of each medication and dose if known) valsartan  (DIOVAN ) 40 MG tablet    2. Would you like to learn more about the convenience, safety, & potential cost savings by using the [facility]? No    3. Are you open to using the [facility] (Type [facility]. No    4. Which pharmacy/location (including street and city if local pharmacy) is medication to be sent to? [facility] [address], [place] - [address]     5. Do they need a 30 day or 90 day supply? 90 day   Pt is out of medication. Pt scheduled 08/14/24  "

## 2024-07-02 NOTE — Telephone Encounter (Signed)
 Refill was sent 07/01/24, to get pt to his appointment.

## 2024-07-12 ENCOUNTER — Telehealth: Payer: Self-pay | Admitting: Family Medicine

## 2024-07-12 DIAGNOSIS — H7391 Unspecified disorder of tympanic membrane, right ear: Secondary | ICD-10-CM

## 2024-07-12 DIAGNOSIS — R2689 Other abnormalities of gait and mobility: Secondary | ICD-10-CM

## 2024-07-12 NOTE — Addendum Note (Signed)
 Addended by: AVELINA NO E on: 07/12/2024 04:48 PM   Modules accepted: Orders

## 2024-07-12 NOTE — Telephone Encounter (Signed)
 I do not see referral in chart for ENT.

## 2024-07-12 NOTE — Telephone Encounter (Signed)
 Please let patient know.. I apologize but order was not placed corrects... referral has been sent... look for phone call, let us  know if you do not hear from referral coordinator in next 2 weeks

## 2024-07-12 NOTE — Telephone Encounter (Signed)
 Copied from CRM [id number]. Topic: Appointments - Scheduling Inquiry for Clinic >> Jul 12, 2024 10:15 AM Avram MATSU wrote: Reason for CRM: Patient stated has been waiting for ENT appointment and that his provider was suppose to set it up, please advise [phone]    ----------------------------------------------------------------------- From previous Reason for Contact - Appt Info/Confirm: Patient/patient representative is calling for information regarding an appointment.

## 2024-07-12 NOTE — Telephone Encounter (Signed)
 [name] notified as instructed by telephone.  Patient states understanding.

## 2024-07-22 ENCOUNTER — Other Ambulatory Visit: Payer: Self-pay | Admitting: Cardiovascular Disease

## 2024-08-14 ENCOUNTER — Ambulatory Visit: Admitting: Cardiology

## 2024-08-14 ENCOUNTER — Encounter: Payer: Self-pay | Admitting: Cardiology

## 2024-08-14 VITALS — BP 152/90 | HR 77 | Ht 65.75 in | Wt 186.8 lb

## 2024-08-14 DIAGNOSIS — E785 Hyperlipidemia, unspecified: Secondary | ICD-10-CM

## 2024-08-14 DIAGNOSIS — I361 Nonrheumatic tricuspid (valve) insufficiency: Secondary | ICD-10-CM

## 2024-08-14 DIAGNOSIS — I25118 Atherosclerotic heart disease of native coronary artery with other forms of angina pectoris: Secondary | ICD-10-CM

## 2024-08-14 DIAGNOSIS — R0609 Other forms of dyspnea: Secondary | ICD-10-CM

## 2024-08-14 DIAGNOSIS — Z951 Presence of aortocoronary bypass graft: Secondary | ICD-10-CM | POA: Diagnosis not present

## 2024-08-14 DIAGNOSIS — I4811 Longstanding persistent atrial fibrillation: Secondary | ICD-10-CM | POA: Diagnosis not present

## 2024-08-14 DIAGNOSIS — I10 Essential (primary) hypertension: Secondary | ICD-10-CM | POA: Diagnosis not present

## 2024-08-14 MED ORDER — ISOSORBIDE MONONITRATE ER 60 MG PO TB24: 60.0000 mg | ORAL_TABLET | Freq: Every day | ORAL | 3 refills | Status: AC

## 2024-08-14 MED ORDER — VALSARTAN 40 MG PO TABS: ORAL_TABLET | ORAL | 3 refills | Status: AC

## 2024-08-14 NOTE — Patient Instructions (Addendum)
 Medication Instructions:  Imdur  increased to 60 mg daily. *If you need a refill on your cardiac medications before your next appointment, please call your pharmacy*  Lab Work: None  If you have labs (blood work) drawn today and your tests are completely normal, you will receive your results only by: MyChart Message (if you have MyChart) OR A paper copy in the mail If you have any lab test that is abnormal or we need to change your treatment, we will call you to review the results.  Testing/Procedures: None   Follow-Up: At [facility], you and your health needs are our priority.  As part of our continuing mission to provide you with exceptional heart care, our providers are all part of one team.  This team includes your primary Cardiologist (physician) and Advanced Practice Providers or APPs (Physician Assistants and Nurse Practitioners) who all work together to provide you with the care you need, when you need it.  Your next appointment:   3 - 4 weeks for blood pressure check.  Provider:   One of our Advanced Practice Providers (APPs): Morse Clause, PA-C  Hanh Waddell Daniels, PA-C  Saddie Cleaves, NP  Olivia Pavy, PA-C Miriam Shams, NP  Leontine Salen, PA-C Josefa Beauvais, NP  [facility], PA-C [address], PA-C  [place], PA-C [place], [place]  Damien Braver, NP Jon Hails, PA-C  Waddell Donath, PA-C Dayna Dunn, PA-C  Taylor [place]erry, PA-C [place], [place] Glendia Ferrier, PA-C Callie Goodrich, PA-C  Katlyn West, NP Thom Sluder, PA-C  Alyssa White, NP Rollo Louder, PA-C Xika Zhao, NP    Lamarr Satterfield, NP                          Other Instructions Please check your blood pressure 2 times per day, log and bring for review.

## 2024-08-19 ENCOUNTER — Institutional Professional Consult (permissible substitution) (INDEPENDENT_AMBULATORY_CARE_PROVIDER_SITE_OTHER): Admitting: Physician Assistant

## 2024-08-29 ENCOUNTER — Inpatient Hospital Stay

## 2024-09-12 ENCOUNTER — Encounter: Admitting: Family Medicine

## 2024-11-27 ENCOUNTER — Inpatient Hospital Stay

## 2024-11-27 ENCOUNTER — Inpatient Hospital Stay: Admitting: Hematology and Oncology
